# Patient Record
Sex: Female | Born: 1950 | Race: Asian | Hispanic: No | Marital: Married | Smoking: Never smoker
Health system: Southern US, Community
[De-identification: ages and names within clinical notes are randomized; demographics above are authoritative.]

## PROBLEM LIST (undated history)

## (undated) DIAGNOSIS — K219 Gastro-esophageal reflux disease without esophagitis: Secondary | ICD-10-CM

## (undated) DIAGNOSIS — M549 Dorsalgia, unspecified: Secondary | ICD-10-CM

## (undated) DIAGNOSIS — I700 Atherosclerosis of aorta: Secondary | ICD-10-CM

## (undated) DIAGNOSIS — M2550 Pain in unspecified joint: Secondary | ICD-10-CM

## (undated) DIAGNOSIS — K449 Diaphragmatic hernia without obstruction or gangrene: Secondary | ICD-10-CM

## (undated) DIAGNOSIS — R609 Edema, unspecified: Secondary | ICD-10-CM

## (undated) DIAGNOSIS — I10 Essential (primary) hypertension: Secondary | ICD-10-CM

## (undated) DIAGNOSIS — E785 Hyperlipidemia, unspecified: Secondary | ICD-10-CM

## (undated) DIAGNOSIS — J329 Chronic sinusitis, unspecified: Secondary | ICD-10-CM

## (undated) DIAGNOSIS — T7840XA Allergy, unspecified, initial encounter: Secondary | ICD-10-CM

## (undated) DIAGNOSIS — K5900 Constipation, unspecified: Secondary | ICD-10-CM

## (undated) DIAGNOSIS — M8580 Other specified disorders of bone density and structure, unspecified site: Secondary | ICD-10-CM

## (undated) DIAGNOSIS — Z86010 Personal history of colonic polyps: Secondary | ICD-10-CM

## (undated) DIAGNOSIS — G4730 Sleep apnea, unspecified: Secondary | ICD-10-CM

## (undated) HISTORY — DX: Sleep apnea, unspecified: G47.30

## (undated) HISTORY — PX: DILATION AND CURETTAGE OF UTERUS: SHX78

## (undated) HISTORY — DX: Atherosclerosis of aorta: I70.0

## (undated) HISTORY — DX: Allergy, unspecified, initial encounter: T78.40XA

## (undated) HISTORY — DX: Edema, unspecified: R60.9

## (undated) HISTORY — DX: Dorsalgia, unspecified: M54.9

## (undated) HISTORY — DX: Hyperlipidemia, unspecified: E78.5

## (undated) HISTORY — DX: Essential (primary) hypertension: I10

## (undated) HISTORY — DX: Pain in unspecified joint: M25.50

## (undated) HISTORY — DX: Constipation, unspecified: K59.00

## (undated) HISTORY — DX: Chronic sinusitis, unspecified: J32.9

## (undated) HISTORY — DX: Gastro-esophageal reflux disease without esophagitis: K21.9

## (undated) HISTORY — DX: Diaphragmatic hernia without obstruction or gangrene: K44.9

## (undated) HISTORY — DX: Other specified disorders of bone density and structure, unspecified site: M85.80

## (undated) HISTORY — DX: Personal history of colonic polyps: Z86.010

---

## 2004-05-09 ENCOUNTER — Encounter: Payer: Self-pay | Admitting: Internal Medicine

## 2007-06-13 ENCOUNTER — Encounter: Payer: Self-pay | Admitting: Pulmonary Disease

## 2008-06-12 LAB — HM COLONOSCOPY

## 2009-04-12 ENCOUNTER — Encounter: Payer: Self-pay | Admitting: Internal Medicine

## 2010-03-28 ENCOUNTER — Ambulatory Visit: Payer: Self-pay | Admitting: Diagnostic Radiology

## 2010-03-28 ENCOUNTER — Encounter: Payer: Self-pay | Admitting: Cardiovascular Disease

## 2010-03-28 ENCOUNTER — Encounter: Payer: Self-pay | Admitting: Emergency Medicine

## 2010-03-28 ENCOUNTER — Observation Stay (HOSPITAL_COMMUNITY): Admission: EM | Admit: 2010-03-28 | Discharge: 2010-03-29 | Payer: Self-pay | Admitting: Internal Medicine

## 2010-03-29 ENCOUNTER — Encounter (INDEPENDENT_AMBULATORY_CARE_PROVIDER_SITE_OTHER): Payer: Self-pay | Admitting: Internal Medicine

## 2010-04-07 DIAGNOSIS — E782 Mixed hyperlipidemia: Secondary | ICD-10-CM | POA: Insufficient documentation

## 2010-04-07 DIAGNOSIS — G4730 Sleep apnea, unspecified: Secondary | ICD-10-CM | POA: Insufficient documentation

## 2010-04-08 ENCOUNTER — Ambulatory Visit: Payer: Self-pay | Admitting: Cardiovascular Disease

## 2010-04-27 ENCOUNTER — Ambulatory Visit: Payer: Self-pay | Admitting: Internal Medicine

## 2010-04-27 ENCOUNTER — Telehealth (INDEPENDENT_AMBULATORY_CARE_PROVIDER_SITE_OTHER): Payer: Self-pay | Admitting: Radiology

## 2010-04-27 ENCOUNTER — Ambulatory Visit (HOSPITAL_BASED_OUTPATIENT_CLINIC_OR_DEPARTMENT_OTHER)
Admission: RE | Admit: 2010-04-27 | Discharge: 2010-04-27 | Payer: Self-pay | Source: Home / Self Care | Admitting: Cardiovascular Disease

## 2010-04-27 ENCOUNTER — Ambulatory Visit: Payer: Self-pay | Admitting: Diagnostic Radiology

## 2010-04-27 DIAGNOSIS — R7309 Other abnormal glucose: Secondary | ICD-10-CM | POA: Insufficient documentation

## 2010-04-27 DIAGNOSIS — K449 Diaphragmatic hernia without obstruction or gangrene: Secondary | ICD-10-CM

## 2010-04-27 DIAGNOSIS — K219 Gastro-esophageal reflux disease without esophagitis: Secondary | ICD-10-CM | POA: Insufficient documentation

## 2010-04-27 DIAGNOSIS — R7401 Elevation of levels of liver transaminase levels: Secondary | ICD-10-CM | POA: Insufficient documentation

## 2010-04-27 DIAGNOSIS — R740 Nonspecific elevation of levels of transaminase and lactic acid dehydrogenase [LDH]: Secondary | ICD-10-CM

## 2010-04-27 DIAGNOSIS — R0789 Other chest pain: Secondary | ICD-10-CM | POA: Insufficient documentation

## 2010-04-27 DIAGNOSIS — R7402 Elevation of levels of lactic acid dehydrogenase (LDH): Secondary | ICD-10-CM | POA: Insufficient documentation

## 2010-04-27 HISTORY — DX: Gastro-esophageal reflux disease without esophagitis: K44.9

## 2010-04-27 HISTORY — DX: Gastro-esophageal reflux disease without esophagitis: K21.9

## 2010-04-28 ENCOUNTER — Ambulatory Visit (HOSPITAL_COMMUNITY)
Admission: RE | Admit: 2010-04-28 | Discharge: 2010-04-28 | Payer: Self-pay | Source: Home / Self Care | Admitting: Cardiovascular Disease

## 2010-04-28 ENCOUNTER — Ambulatory Visit: Payer: Self-pay

## 2010-04-28 ENCOUNTER — Ambulatory Visit: Payer: Self-pay | Admitting: Cardiology

## 2010-05-17 ENCOUNTER — Ambulatory Visit: Payer: Self-pay | Admitting: Pulmonary Disease

## 2010-06-01 ENCOUNTER — Encounter: Payer: Self-pay | Admitting: Internal Medicine

## 2010-06-01 ENCOUNTER — Ambulatory Visit
Admission: RE | Admit: 2010-06-01 | Discharge: 2010-06-01 | Payer: Self-pay | Source: Home / Self Care | Attending: Internal Medicine | Admitting: Internal Medicine

## 2010-06-01 LAB — CONVERTED CEMR LAB
ALT: 45 units/L — ABNORMAL HIGH (ref 0–35)
Alkaline Phosphatase: 79 units/L (ref 39–117)
BUN: 12 mg/dL (ref 6–23)
Bilirubin, Direct: 0.1 mg/dL (ref 0.0–0.3)
Cholesterol: 177 mg/dL (ref 0–200)
Creatinine, Ser: 0.58 mg/dL (ref 0.40–1.20)
Helicobacter Pylori Antibody-IgG: 3.8 — ABNORMAL HIGH
Indirect Bilirubin: 0.4 mg/dL (ref 0.0–0.9)
Total Protein: 7.4 g/dL (ref 6.0–8.3)
Triglycerides: 79 mg/dL (ref <150)

## 2010-06-03 ENCOUNTER — Telehealth: Payer: Self-pay | Admitting: Internal Medicine

## 2010-06-08 ENCOUNTER — Telehealth: Payer: Self-pay | Admitting: Internal Medicine

## 2010-06-08 DIAGNOSIS — A048 Other specified bacterial intestinal infections: Secondary | ICD-10-CM | POA: Insufficient documentation

## 2010-06-23 ENCOUNTER — Telehealth: Payer: Self-pay | Admitting: Internal Medicine

## 2010-06-24 ENCOUNTER — Encounter: Payer: Self-pay | Admitting: Internal Medicine

## 2010-06-27 ENCOUNTER — Telehealth: Payer: Self-pay | Admitting: Internal Medicine

## 2010-06-28 ENCOUNTER — Ambulatory Visit
Admission: RE | Admit: 2010-06-28 | Discharge: 2010-06-28 | Payer: Self-pay | Source: Home / Self Care | Attending: Pulmonary Disease | Admitting: Pulmonary Disease

## 2010-06-28 NOTE — Assessment & Plan Note (Signed)
Summary: nph/self ref ed/chest pain/bcbs/mj   Visit Type:  Initial Consult Primary Ember Henrikson:  None   History of Present Illness: Margaret [name] is seen today at the request of Wood.  She was seen on 10/31 for SSCP.  Records reviewed.  R/O normal CXR and normal ECG.  Recommended outpatient stress test.  She had 304 days of SSCP made worse by certain foods, radiating to neck.  There is clearly a componant of GERD or biliary cholic to her story as fatty foods make the pain worse and it is worse if she eats late at night.  Has exertional dyspnea which sounds more like deconditioning but no exertional SSCP.  CRF ? elevated cholesterol not on statin.  Moved from [place] a year ago and has been trying to get into see Dr Artist Pais as a new primary.  Denies palpitaitons, pleuritic pain, nausea, vohmiting, edema or syncope.  No previous GB disease  Current Problems (verified): 1)  Chest Pain  (ICD-786.50) 2)  Hyperlipidemia  (ICD-272.4) 3)  Sleep Apnea  (ICD-780.57)  Current Medications (verified): 1)  Aspirin Ec 325 Mg Tbec (Aspirin) .... Take One Tablet By Mouth Daily 2)  Crestor 5 Mg Tabs (Rosuvastatin Calcium) .... Take One Tablet By Mouth Daily. 3)  Multivitamins   Tabs (Multiple Vitamin) .... Once Daily 4)  Fish Oil   Oil (Fish Oil)  Allergies (verified): No Known Drug Allergies  Past History:  Past Medical History: Last updated: 04/07/2010 CHEST PAIN  HYPERLIPIDEMIA SLEEP APNEA   Past Surgical History: Last updated: 04/07/2010 Dilation and Curettage  Family History: Last updated: 04/07/2010 Significant for stroke in the Mother  Social History: Last updated: 04/07/2010 Tobacco Use - No.  Alcohol Use - no Drug Use - no Moved from [place] in 2010  Review of Systems       Denies fever, malais, weight loss, blurry vision, decreased visual acuity, cough, sputum, SOB, hemoptysis, pleuritic pain, palpitaitons, heartburn, abdominal pain, melena, lower extremity edema, claudication, or  rash.   Vital Signs:  Patient profile:   60 year old female Height:      62 inches Weight:      185 pounds BMI:     33.96 O2 Sat:      63 % BP sitting:   130 / 64  (left arm)  Vitals Entered By: Laurance Flatten CMA (April 08, 2010 11:11 AM)  Physical Exam  General:  Affect appropriate Healthy:  appears stated age HEENT: normal Neck supple with no adenopathy JVP normal no bruits no thyromegaly Lungs clear with no wheezing and good diaphragmatic motion Heart:  S1/S2 no murmur,rub, gallop or click PMI normal Abdomen: benighn, BS positve, no tenderness, no AAA no bruit.  No HSM or HJR Distal pulses intact with no bruits No edema Neuro non-focal Skin warm and dry    Impression & Recommendations:  Problem # 1:  CHEST PAIN (ICD-786.50)  Atypical F/U stress echo Needs GB US and H-2 blocker.  Advised on low carb diet.  F/U with Dr Artist Pais Her updated medication list for this problem includes:    Aspirin Ec 325 Mg Tbec (Aspirin) .Marland Kitchen... Take one tablet by mouth daily  Orders: Misc. Referral (Misc. Ref) Stress Echo (Stress Echo)  Problem # 2:  HYPERLIPIDEMIA (ICD-272.4) Continue statin labs per primary.  Target LDL less than 100 Her updated medication list for this problem includes:    Crestor 5 Mg Tabs (Rosuvastatin calcium) .Marland Kitchen... Take one tablet by mouth daily.  Other Orders: Primary Care Referral (Primary)  Patient Instructions: 1)  Your physician recommends that you schedule a follow-up appointment in:  AS NEEDED 2)  Your physician recommends that you continue on your current medications as directed. Please refer to the Current Medication list given to you today. 3)  You have been referred to [facility] FOR PRIMARY CARE 4)  ALSO NEEDS  GALLBLADDER ULTRASOUND 5)  Your physician has requested that you have a stress echocardiogram. For further information please visit [email or web address]  Please follow instruction sheet as given.   EKG Report  Procedure date:   03/28/2010  Findings:      NSR 59 Normal ECG

## 2010-06-28 NOTE — Assessment & Plan Note (Signed)
Summary: Est care BCBS/dt   Vital Signs:  Patient profile:   60 year old female Height:      61.25 inches Weight:      185.75 pounds BMI:     34.94 O2 Sat:      94 % on Room air Temp:     98.3 degrees F oral Pulse rate:   64 / minute Resp:     16 per minute BP sitting:   130 / 68  (right arm) Cuff size:   large  Vitals Entered By: Glendell Docker CMA (April 27, 2010 9:19 AM)  O2 Flow:  Room air CC: New Patient  Is Patient Diabetic? No Pain Assessment Patient in pain? no      Comments Establish care, scheduled for gall bladder ultrasound at 10:30a   Primary Care Provider:  Dondra Spry DO  CC:  New Patient .  History of Present Illness: 60 y/o female to establish hx of recent chest pain seen by Dr. Eden Emms - stress test planned symptoms worse with food raising possibilty of biliary colic vs gerd abd u/s planned she also has unexplained elevation in LFTs she denies hx of chronic liver dz,  she denies alcohol use no family hx of chronic liver dz  Preventive Screening-Counseling & Management  Alcohol-Tobacco     Alcohol drinks/day: 0     Smoking Status: never  Caffeine-Diet-Exercise     Caffeine use/day: 1-2 beverages daily     Does Patient Exercise: no  Allergies (verified): No Known Drug Allergies  Past History:  Past Medical History: CHEST PAIN  HYPERLIPIDEMIA  SLEEP APNEA  GERD Obesity  Family History: Significant for stroke in the Mother  at age 40 Family History Lung cancer - father No cancer No DM II  Social History: Tobacco Use - No.  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  The patient denies weight loss, syncope, prolonged cough, melena, hematochezia, and depression.         snoring,  daytime  somnolence  Physical Exam  General:  alert, well-developed, and well-nourished.   Head:  normocephalic and atraumatic.   Eyes:  pupils equal, pupils round, and pupils reactive to light.   Ears:  R ear normal and L ear normal.   Mouth:  pharynx pink and moist.   Neck:  No deformities, masses, or tenderness noted.no carotid bruits.   Lungs:  normal respiratory effort, normal breath sounds, no crackles, and no wheezes.   Heart:  normal rate, regular rhythm, and no gallop.   Abdomen:  soft, non-tender, normal bowel sounds, no hepatomegaly, and no splenomegaly.   Extremities:  No lower extremity edema  Neurologic:  cranial nerves II-XII intact and gait normal.   Psych:  normally interactive, good eye contact, not anxious appearing, and not depressed appearing.     Impression & Recommendations:  Problem # 1:  CHEST PAIN, ATYPICAL (ICD-786.59) stress echo planned awaiting results of abd u/s use PPI regularly  Problem # 2:  NONSPEC ELEVATION OF LEVELS OF TRANSAMINASE/LDH (ICD-790.4) rule out chronic liver dz  Orders: T-Hepatitis B Surface Antibody ([id number]) T-Hepatitis B Surface Antigen ([id number]) T-Hepatitis C Antibody (60630-16010) T-Ferritin (93235-57322) T-Iron ([id number]) T-Iron Binding Capacity (TIBC) ([address])  Problem #  3:  SLEEP APNEA (ICD-780.57)  Orders: Sleep Disorder Referral (Sleep Disorder)  Complete Medication List: 1)  Aspirin Ec 325 Mg Tbec (Aspirin) .... Take one tablet by mouth daily 2)  Crestor 5 Mg Tabs (Rosuvastatin calcium) .... Take one tablet by mouth daily. 3)  Multivitamins Tabs (Multiple vitamin) .... Once daily 4)  Fish Oil Oil (Fish oil) 5)  Calcium 500 Mg Tabs (Calcium) .... 2 tablets by mouth once daily 6)  Omeprazole 40 Mg Cpdr (Omeprazole) .... One by mouth once daily am 30 mins before breakfast  Other Orders: T- Hemoglobin A1C (16109-60454) T-Hepatic Function [phone] [id number]) T-Lipid Profile (29562-13086)  Patient  Instructions: 1)  Please schedule a follow-up appointment in 1 month. Prescriptions: OMEPRAZOLE 40 MG CPDR (OMEPRAZOLE) one by mouth once daily AM 30 mins before breakfast  #30 x 2   Entered and Authorized by:   D. Thomos Lemons DO   Signed by:   D. Thomos Lemons DO on 04/27/2010   Method used:   Print then Give to Patient   RxID:   [id number]    Orders Added: 1)  T-Hepatitis B Surface Antibody [[id number]] 2)  T-Hepatitis B Surface Antigen [66440-34742] 3)  T-Hepatitis C Antibody [59563-87564] 4)  T-Ferritin [82728-23350] 5)  T-Iron [33295-18841] 6)  T-Iron Binding Capacity (TIBC) [[address]] 7)  T- Hemoglobin A1C [83036-23375] 8)  T-Hepatic Function [80076-22960] 9)  T-Lipid Profile [80061-22930] 10)  Sleep Disorder Referral [Sleep Disorder] 11)  New Patient Level III [99203]   Immunization History:  Influenza Immunization History:    Influenza:  declined (04/27/2010)   Contraindications/Deferment of Procedures/Staging:    Test/Procedure: FLU VAX    Reason for deferment: patient declined   Immunization History:  Influenza Immunization History:    Influenza:  Declined (04/27/2010)   Preventive Care Screening  Mammogram:    Date:  04/27/2010    Results:  appt 12/5  Pap Smear:    Date:  03/07/2010    Results:  normal   Bone Density:    Date:  03/15/2009    Results:  normal std dev  Colonoscopy:    Date:  04/14/2008    Results:  normal    Current Allergies (reviewed today): No known allergies

## 2010-06-28 NOTE — Progress Notes (Signed)
Summary: stress echo pre-procedure  Phone Note Outgoing Call   Call placed by: Harlow Asa CNMT Call placed to: Patient Reason for Call: Confirm/change Appt Summary of Call: Spoke with patient concerning Stress Echo.

## 2010-06-30 NOTE — Assessment & Plan Note (Addendum)
Summary: sleep apnea/jd   Visit Type:  Initial Consult Copy to:  pcp Primary Provider/Referring Provider:  Dondra Spry DO  CC:  Sleep evaluation.  History of Present Illness: 59/F, Filipino woman, never smoker, for management of obstructive sleep apnea. She moved from [place] 1 yr ago 2 yrs ago, she had PSG in [place] , put on CPAP ? pressure ([employer]) with swift nasal pillows Uses 4-5 h, takes it off for BR visit Bedtime MN to 1 A, used to be 10p, stays in bed sometimes until 8A or 10A, rested with cpap sleeps on side x 2 pillows, Epworth Sleepiness Score 19 gained 20 lbs over last 2 y If does not use the CPAP, dry mouth There is no history suggestive of cataplexy, sleep paralysis or parasomnias   Preventive Screening-Counseling & Management  Alcohol-Tobacco     Alcohol drinks/day: 0     Smoking Status: never   History of Present Illness: sometimes good. sometimes not  What time do you typically go to bed?(between what hours): between 12pm-1am  How long does it take you to fall asleep? right away  How many times during the night do you wake up? at least 2x  What time do you get out of bed to start your day? sometimes 8am or 10am  Do you drive or operate heavy machinery in your occupation? no  How much has your weight changed (up or down) over the past two years? (in pounds): at least 20 lbs increase  Have you ever had a sleep study before?  If yes,when and where: yes, at least 2 years ago  Do you currently use CPAP ? If so , at what pressure? yes, 4  Do you wear oxygen at any time? If yes, how many liters per minute? No Current Medications (verified): 1)  Aspirin Ec 325 Mg Tbec (Aspirin) .... Take One Tablet By Mouth Daily 2)  Crestor 5 Mg Tabs (Rosuvastatin Calcium) .... Take One Tablet By Mouth Daily. 3)  Multivitamins   Tabs (Multiple Vitamin) .... Once Daily 4)  Fish Oil   Oil (Fish Oil) .... Take 1 Capsule By Mouth Once A Day 5)  Calcium 500 Mg Tabs (Calcium) .... 2  Tablets By Mouth Once Daily 6)  Cpap  Allergies (verified): No Known Drug Allergies  Past History:  Past Medical History: Last updated: 04/27/2010 CHEST PAIN  HYPERLIPIDEMIA  SLEEP APNEA  GERD Obesity  Past Surgical History: Last updated: 04/07/2010 Dilation and Curettage  Family History: Last updated: 04/27/2010 Significant for stroke in the Mother  at age 40 Family History Lung cancer - father No cancer No DM II  Social History: Last updated: 04/27/2010 Tobacco Use - No.  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  The patient denies shortness of breath at rest, productive cough, non-productive cough, coughing up blood, chest pain, irregular heartbeats, loss of appetite, abdominal pain, difficulty swallowing, sore throat, tooth/dental problems, itching, ear ache, anxiety, depression, hand/feet swelling, joint stiffness or pain, rash, and fever.    Vital Signs:  Patient profile:   60 year old female Height:      61.25 inches Weight:  185 pounds BMI:     34.80 O2 Sat:      96 % on Room air Temp:     98.1 degrees F oral Pulse rate:   82 / minute BP sitting:   108 / 72  (left arm) Cuff size:   large  Vitals Entered By: Zackery Barefoot CMA (May 17, 2010 2:57 PM)  O2 Flow:  Room air CC: Sleep evaluation Comments Medications reviewed with patient Verified contact number and pharmacy with patient Zackery Barefoot CMA  May 17, 2010 2:57 PM    Physical Exam  Additional Exam:  Gen. Pleasant, well-nourished, in no distress, normal affect ENT - no lesions, no post nasal drip, class 3 airway Neck: No JVD, no thyromegaly, no carotid  bruits Lungs: no use of accessory muscles, no dullness to percussion, clear without rales or rhonchi  Cardiovascular: Rhythm regular, heart sounds  normal, no murmurs or gallops, no peripheral edema Abdomen: soft and non-tender, no hepatosplenomegaly, BS normal. Musculoskeletal: No deformities, no cyanosis or clubbing Neuro:  alert, non focal     Impression & Recommendations:  Problem # 1:  SLEEP APNEA (ICD-780.57) The pathophysiology of obstructive sleep apnea, it's cardiovascular consequences and modes of treatment including CPAP were discussed with the patient in great detail.  Nasonex spray  New nasal mask instead of pillows. Compliance encouraged, wt loss emphasized, asked to avoid meds with sedative side effects, cautioned against driving when sleepy.  Obtian PSG - discussed other options  Orders: Consultation Level III (96295) DME Referral (DME)  Medications Added to Medication List This Visit: 1)  Fish Oil Oil (Fish oil) .... Take 1 capsule by mouth once a day 2)  Cpap   Patient Instructions: 1)  Copy sent to: dr Artist Pais 2)  Please schedule a follow-up appointment in 1 month. 3)  We will get you a new nasal mask & check download from your machine 4)  Weight loss is advised - 20 lbs 5)  Nasal spray - nasonex 1 spray each nare at bedtime    Appended Document: sleep apnea/jd download on 10 cm upto 07/06/10 >> poor usage - pl call pt  Appended Document: sleep apnea/jd pt informed to increase usage to at least 4 hours every night but more usage will have better benefits.

## 2010-06-30 NOTE — Assessment & Plan Note (Signed)
Summary: 1 month follow up/mhf   Vital Signs:  Patient profile:   60 year old female Height:      61.25 inches Weight:      185.25 pounds BMI:     34.84 O2 Sat:      97 % on Room air Temp:     97.9 degrees F oral Pulse rate:   82 / minute Resp:     18 per minute BP sitting:   114 / 74  (right arm) Cuff size:   large  Vitals Entered By: Glendell Docker CMA (June 01, 2010 10:59 AM)  O2 Flow:  Room air CC: 1 month follow up Is Patient Diabetic? No Pain Assessment Patient in pain? no      Comments states she had problems with omeprazole with dizziness, so she stopped taking the medication   Primary Care Provider:  DThomos Lemons DO  CC:  1 month follow up.  History of Present Illness: 60 y/o Asian female for f/u re:  atypical chest pain pt feeling better she tried ompeprazole but stopped due to dizziness  Preventive Screening-Counseling & Management  Alcohol-Tobacco     Smoking Status: never  Allergies (verified): 1)  Omeprazole (Omeprazole)  Past History:  Past Medical History: CHEST PAIN  HYPERLIPIDEMIA  SLEEP APNEA   GERD Obesity  Family History: Significant for stroke in the Mother  at age 58 Family History Lung cancer - father No cancer No DM II    Social History: Tobacco Use - No.  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   Lungs:  normal respiratory effort and normal breath sounds.   Heart:  normal rate, regular rhythm, and no gallop.   Extremities:  No lower extremity edema    Impression & Recommendations:  Problem # 1:  DIABETES MELLITUS, TYPE II, BORDERLINE (ICD-790.29) pt reports prev pcp monitoring A1c due to borderline DM II Pt counseled on diet and  exercise.  Orders: T-Basic Metabolic Panel [id number]) T- Hemoglobin A1C ([id number])  Her updated medication list for this problem includes:    Metformin Hcl 500 Mg Tabs (Metformin hcl) .Marland Kitchen... 1/2 by mouth two times a day x 1 week, then one by mouth two times a day  Problem # 2:  HYPERLIPIDEMIA (ICD-272.4)  Her updated medication list for this problem includes:    Crestor 5 Mg Tabs (Rosuvastatin calcium) .Marland Kitchen... Take one tablet by mouth daily.  Orders: T-Hepatic Function [id number]) T-Lipid Profile [id number]) T-TSH [phone])  Problem # 3:  CHEST PAIN, ATYPICAL (ICD-786.59) possible gerd.  check H. Pylori antibody  Complete Medication List: 1)  Aspirin Ec 325 Mg Tbec (Aspirin) .... Take one tablet by mouth daily 2)  Crestor 5 Mg Tabs (Rosuvastatin calcium) .... Take one tablet by mouth daily. 3)  Multivitamins Tabs (Multiple vitamin) .... Once daily 4)  Fish Oil Oil (Fish oil) .... Take 1 capsule by mouth once a day 5)  Calcium 500 Mg Tabs (Calcium) .... 2 tablets by mouth once daily 6)  Cpap  7)  Metformin Hcl 500 Mg Tabs (Metformin hcl) .... 1/2 by mouth two times a day x 1 week, then one by mouth two  times a day  Other Orders: T- * Misc. Laboratory test [id number] [phone])  Patient Instructions: 1)  Please schedule a follow-up appointment in 6 months. 2)  Start regular exercise program Prescriptions: CRESTOR 5 MG TABS (ROSUVASTATIN CALCIUM) Take one tablet by mouth daily.  #90 x 1   Entered and Authorized by:   D. Thomos Lemons DO   Signed by:   D. Thomos Lemons DO on 06/01/2010   Method used:   Electronically to        [facility] [phone]* (retail)       [address]       [place], [place]  [address]       Ph: [phone]       Fax: [phone]   RxID:   [id number]    Orders Added: 1)  T-Basic Metabolic Panel [id number] 2)  T- Hemoglobin A1C [[id number]] 3)  T-Hepatic Function [80076-22960] 4)  T-Lipid Profile  [80061-22930] 5)  T-TSH [[id number]] 6)  T- * Misc. Laboratory test [99999] 7)  Est. Patient Level III [36144]

## 2010-06-30 NOTE — Progress Notes (Signed)
Summary: Lab Results  Phone Note Outgoing Call   Summary of Call: call pt - blood work shows pt has + H. Pylori antibody.  I suggest referral to GI for EGD and treatment also pt is very close to being diabetic.  I suggest pt start metformin.  see rx. ROV in 3 months Initial call taken by: D. Thomos Lemons DO,  June 08, 2010 11:04 AM  Follow-up for Phone Call        call placed to patient at [phone], no answer. A detailed voice message was left informing patient per Dr Artist Pais instructions. Message left for patient to call back if any questions Follow-up by: Glendell Docker CMA,  June 08, 2010 11:12 AM  Additional Follow-up for Phone Call Additional follow up Details #1::        call placed to patient  at  [phone],she has been advised per Dr Artist Pais instructions. She states that she is out of town in [place] and will return on Saturday. She states that she will follow up with a return call on Monday. She would like to know what H-Pylori is and how is she is affected by this. She would also like to know why does she have to take the Metformin if she is not diabetic yet.  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 Pylori infection pt states she had EGD and colonoscopy last yr in [place].  Pt not sure if H. Pylori testing was completed pt will contact GI specialist and ask them to fax [place] reports (including pathology report)  Pt does not want to start metformin for now.  she would like additional 3 months to try diet and  exercise agreed  plz schedule repeat BMET and A1c in 3 months and OV.  [facility] referral can be deferred until we obtatin copy of reports from [place] Additional Follow-up by: D. 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 Thomos Lemons DO   Signed by:   D. Thomos Lemons DO on 06/08/2010   Method used:   [facility] to        [facility] [phone]* (retail)       [address]       [place], [place]  [address]       Ph: [phone]       Fax: [phone]   RxID:   [id number]  Lab order enetered for April of 2012 for BMET and A1C Glendell Docker CMA  June 13, 2010 9:26 AM

## 2010-06-30 NOTE — Miscellaneous (Signed)
Summary: colonoscopy  Clinical Lists Changes  Observations: Added new observation of COLONOSCOPY: normal. Small nodule descending colon, very few diverticula. Next due in 2016 (04/12/2010 14:03)      Preventive Care Screening  Colonoscopy:    Date:  04/12/2010    Results:  normal. Small nodule descending colon, very few diverticula. Next due in 2016

## 2010-06-30 NOTE — Progress Notes (Signed)
Summary: International Travel  Phone Note Call from Patient Call back at Home Phone [phone]   Caller: Patient Call For: Fianna Snowball  Summary of Call: SHE IS GOING TO [place] AND SHE NEEDS TO GET SEVERAL TESTS DONE AND SOME SHOTS  TO GET A YELLOW CARD TO GO THERE.  WOULD WE BE ABLE TO DO THESE SHOTS AND TESTS FOR HER.  Initial call taken by: Roselle Locus,  June 03, 2010 3:50 PM  Follow-up for Phone Call        call was returned to patient at [phone], no answer. She was informed international travel vaccines were no administered in the office. The number for [facility] international travel was [phone]) for patient contact Follow-up by: Glendell Docker CMA,  June 03, 2010 5:06 PM

## 2010-06-30 NOTE — Progress Notes (Signed)
Summary: Record Status  Phone Note Call from Patient Call back at Home Phone [phone]   Caller: Patient Call For: D. Thomos Lemons DO Summary of Call: patient called and left message wanting to know if records have been recieved from her GI doctor in [place].   Call was returned to patient at [phone], no answer. A detailed voice message was left for patient informing patient records had not been recieved. She was advised to call back if any questions Initial call taken by: Glendell Docker CMA,  June 23, 2010 8:34 AM  Follow-up for Phone Call        Records received and forwarded to Provider for review. Nicki Guadalajara Fergerson CMA Duncan Dull)  June 24, 2010 2:04 PM

## 2010-07-06 NOTE — Assessment & Plan Note (Signed)
Summary: 1 month follow up in [facility]//jwr   Visit Type:  Follow-up Copy to:  pcp Primary Provider/Referring Provider:  Dondra Spry DO  CC:  Follow-up visit on OSA pt states has not used CPAP x 1 month.  History of Present Illness: 59/F, Filipino woman, never smoker, for management of obstructive sleep apnea. She moved from [place] 1 yr ago 2 yrs ago, she had PSG in [place] , put on CPAP ? pressure ([facility]) with swift nasal pillows Uses 4-5 h, takes it off for BR visit Bedtime MN to 1 A, used to be 10p, stays in bed sometimes until 8A or 10A, rested with cpap sleeps on side x 2 pillows, Epworth Sleepiness Score 19 gained 20 lbs over last 2 y If does not use the CPAP, dry mouth  June 28, 2010 11:38 AM  Reviewed PSG 1/09 >> AHI 49/h, lowest desatn 71%, , corrected by CPAP 9 cm, placed on auto CPA with nasal pillows There is no history suggestive of cataplexy, sleep paralysis or parasomnias   Preventive Screening-Counseling & Management  Alcohol-Tobacco     Alcohol drinks/day: 0     Smoking Status: never  Current Medications (verified): 1)  Crestor 5 Mg Tabs (Rosuvastatin Calcium) .... Take One Tablet By Mouth Daily. 2)  Multivitamins   Tabs (Multiple Vitamin) .... Once Daily 3)  Fish Oil   Oil (Fish Oil) .... Take 1 Capsule By Mouth Once A Day 4)  Calcium 500 Mg Tabs (Calcium) .... 2 Tablets By Mouth Once Daily 5)  Cpap 6)  Metformin Hcl 500 Mg Tabs (Metformin Hcl) .... ***not Taking***1/2 By Mouth Two Times A Day X 1 Week, Then One By Mouth Two Times A Day  Allergies (verified): 1)  Omeprazole (Omeprazole)  Past History:  Past Medical History: Last updated: 06/01/2010 CHEST PAIN  HYPERLIPIDEMIA  SLEEP APNEA   GERD Obesity  Social History: Last updated: 06/01/2010 Tobacco Use - No.  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    Vital Signs:  Patient profile:   60 year old female Height:      61.25 inches Weight:      182 pounds BMI:     34.23 O2 Sat:      96 % on Room air Temp:     98.6 degrees F oral Pulse rate:   62 / minute BP sitting:   120 / 70  (right arm) Cuff size:   large  Vitals Entered By: Zackery Barefoot CMA (June 28, 2010 11:18 AM)  O2 Flow:  Room air CC: Follow-up visit on OSA pt states has not used CPAP x 1 month Comments Medications reviewed with patient Verified contact number and pharmacy with patient Zackery Barefoot [facility]  June 28, 2010 11:19 AM    Physical Exam  Additional Exam:  wt 182 June 28, 2010  Gen. Pleasant, well-nourished, in no distress, normal affect ENT - no lesions, no post nasal drip, class 3 airway Neck: No JVD, no thyromegaly, no carotid bruits Lungs: no use of accessory muscles, no dullness  to percussion, clear without rales or rhonchi  Cardiovascular: Rhythm regular, heart sounds  normal, no murmurs or gallops, no peripheral edema Musculoskeletal: No deformities, no cyanosis or clubbing      Impression & Recommendations:  Problem # 1:  SLEEP APNEA (ICD-780.57)  Have asked [facility] to provide new nasal mask. The pathophysiology of obstructive sleep apnea, it's cardiovascular consequences and modes of treatment including CPAP were discussed with the patient in great detail.  Discussed compliance Compliance encouraged, wt loss emphasized, asked to avoid meds with sedative side effects, cautioned against driving when sleepy.  Rechk ownlaod once she gets  back on her machine  Orders: Est. Patient Level III (91478)  Medications Added to Medication List This Visit: 1)  Metformin Hcl 500 Mg Tabs (Metformin hcl) .... ***not taking***1/2 by mouth two times a day x 1 week, then one by mouth two times a day  Patient Instructions: 1)  Copy sent to: Dr Artist Pais 2)  Please schedule a follow-up appointment in 3 months. 3)  Get back on your cpap machine 4)  we discussed your sleep study & the severity of  your obstructive sleep apnea. 5)  Turn in the card afte you have used CPPA for a few weeks

## 2010-07-06 NOTE — Progress Notes (Signed)
Summary: GI Referral  Phone Note Outgoing Call   Call placed by: Glendell Docker CMA,  June 27, 2010 1:47 PM Call placed to: [phone]- Dr Jasper Riling Summary of Call: Call placed to  Dr Gwen Her office- at [phone], spoke with receptionist at Dr Gwen Her office to find out if there was a report for EGD. The receptionist stated there was no report. The only test patient had performed was a colonoscopy. There were no other test performed there.   Call was placed to patient at [phone], she was informed records were recieved from Dr Gwen Her office. She was asked if she was seen by another GI other than Dr Harrold Donath. She states that Dr Harrold Donath was the only doctor she was seen by. She was informed that I spoke with there office and the only testing done was a colonscopy. She stated that was the test Dr Artist Pais was wanting her to have. She was informed Dr Artist Pais was asking about EGD, and informed Dr Gwen Her office did not have one file as being performed. Initial call taken by: Glendell Docker CMA,  June 27, 2010 1:51 PM  Follow-up for Phone Call        call was returned to patient at [phone], she was asked if she wanted to proceed with the GI referral for EGD. Patient states she did have a EGD. She was informed that I spoke with the office and was informed they did not have results on file and test was not performed. She states she will call back after checking with their office. Follow-up by: Glendell Docker CMA,  June 27, 2010 2:02 PM  Additional Follow-up for Phone Call Additional follow up Details #1::        patient called back and left voice message stating she would like to reschedule her appointment with GI. Additional Follow-up by: Glendell Docker CMA,  June 27, 2010 3:07 PM

## 2010-07-11 ENCOUNTER — Encounter: Payer: Self-pay | Admitting: Gastroenterology

## 2010-07-11 ENCOUNTER — Encounter (INDEPENDENT_AMBULATORY_CARE_PROVIDER_SITE_OTHER): Payer: Self-pay | Admitting: *Deleted

## 2010-07-11 ENCOUNTER — Ambulatory Visit (INDEPENDENT_AMBULATORY_CARE_PROVIDER_SITE_OTHER): Payer: BC Managed Care – PPO | Admitting: Gastroenterology

## 2010-07-11 DIAGNOSIS — Z1211 Encounter for screening for malignant neoplasm of colon: Secondary | ICD-10-CM

## 2010-07-11 DIAGNOSIS — R933 Abnormal findings on diagnostic imaging of other parts of digestive tract: Secondary | ICD-10-CM

## 2010-07-13 ENCOUNTER — Encounter: Payer: Self-pay | Admitting: Pulmonary Disease

## 2010-07-14 NOTE — Procedures (Signed)
Summary: Colonoscopy/R Jasper Riling MD  Colonoscopy/R Jasper Riling MD   Imported By: Lanelle Bal 07/08/2010 11:30:09  _____________________________________________________________________  External Attachment:    Type:   Image     Comment:   External Document

## 2010-07-15 ENCOUNTER — Other Ambulatory Visit: Payer: Self-pay | Admitting: Gastroenterology

## 2010-07-15 ENCOUNTER — Other Ambulatory Visit (AMBULATORY_SURGERY_CENTER): Payer: BC Managed Care – PPO | Admitting: Gastroenterology

## 2010-07-15 DIAGNOSIS — K449 Diaphragmatic hernia without obstruction or gangrene: Secondary | ICD-10-CM

## 2010-07-15 DIAGNOSIS — K2940 Chronic atrophic gastritis without bleeding: Secondary | ICD-10-CM

## 2010-07-15 DIAGNOSIS — R933 Abnormal findings on diagnostic imaging of other parts of digestive tract: Secondary | ICD-10-CM

## 2010-07-15 DIAGNOSIS — K2970 Gastritis, unspecified, without bleeding: Secondary | ICD-10-CM

## 2010-07-15 LAB — GLUCOSE, CAPILLARY

## 2010-07-20 NOTE — Procedures (Addendum)
Summary: Upper Endoscopy  Patient: Margaret [name] Note: All result statuses are Final unless otherwise noted.  Tests: (1) Upper Endoscopy (EGD)   EGD Upper Endoscopy       DONE     [facility]     [address]     [place], [place]  [address]           ENDOSCOPY PROCEDURE REPORT           PATIENT:  Margaret, [name]  MR#:  [id number]     BIRTHDATE:  12-31-1950, 59 yrs. old  GENDER:  female     ENDOSCOPIST:  Rachael Fee, MD     Referred by:  Thomos Lemons, DO     PROCEDURE DATE:  07/15/2010     PROCEDURE:  EGD with biopsy, 19147     ASA CLASS:  Class II     INDICATIONS:  recent h. pylori + by serology, mild     "dysphagia-like" senstation     MEDICATIONS:  Fentanyl 50 mcg IV, Versed 2 mg IV     TOPICAL ANESTHETIC:  Exactacain Spray           DESCRIPTION OF PROCEDURE:   After the risks benefits and     alternatives of the procedure were thoroughly explained, informed     consent was obtained.  The LB GIF-H180 [id number] endoscope was     introduced through the mouth and advanced to the second portion of     the duodenum, without limitations.  The instrument was slowly     withdrawn as the mucosa was fully examined.     <<PROCEDUREIMAGES>>           There was mild, non specific gastritis that was biopsied     (pathology jar 1) (see image3).  A hiatal hernia was found. This     was 2-3cm (see image2).There was a 1.5cm, submucosal nodule in     very proximal stomach. Appeared benign (see image7 and image8).     Otherwise the examination was normal (see image6 and image5).     Retroflexed views revealed no abnormalities.    The scope was then     withdrawn from the patient and the procedure completed.           COMPLICATIONS:  None           ENDOSCOPIC IMPRESSION:     1) Mild gastritis, biopsied to check for H. pylori     2) Hiatal hernia, small     3) Benign appearing 1-1.5cm submucosal lesion in proximal     stomach (GIST?).  This is unlikely to be causing any  symptoms.     4) Otherwise normal examination           RECOMMENDATIONS:     If biopsies show H. pylori, you will started on the appropriate     antibiotics.  If the biopsies are negative, you should begin the     antiacid medicine that was recommended previously (omeprazole,     prevacid).     The nodule in stomach will likely need more formal evaluation     with endoscopic ultrasound, will set that up after biopsies     results are back.           ______________________________     Rachael Fee, MD           n.     eSIGNED:   Rachael Fee at 07/15/2010 11:19  AM           Margaret, [name], [id number]  Note: An exclamation mark (!) indicates a result that was not dispersed into the flowsheet. Document Creation Date: 07/15/2010 11:19 AM _______________________________________________________________________  (1) Order result status: Final Collection or observation date-time: 07/15/2010 11:11 Requested date-time:  Receipt date-time:  Reported date-time:  Referring Physician:   Ordering Physician: Rob Bunting [phone]) Specimen Source:  Source: Launa Grill Order Number: [id number] Lab site:

## 2010-07-20 NOTE — Procedures (Signed)
Summary: Colonoscopy/R Jasper Riling MD  Colonoscopy/R Jasper Riling MD   Imported By: Lanelle Bal 07/08/2010 11:29:18  _____________________________________________________________________  External Attachment:    Type:   Image     Comment:   External Document

## 2010-07-20 NOTE — Letter (Signed)
Summary: Diabetic Instructions  [facility]  [address] [place], [place] [address]   Phone: [phone]  Fax: [phone]    Margaret [name] 1950/12/26 MRN: [id number]   X  ORAL DIABETIC MEDICATION INSTRUCTIONS  The day before your procedure:   Take your diabetic pill as you do normally  The day of your procedure:   Do not take your diabetic pill    We will check your blood sugar levels during the admission process and again in Recovery before discharging you home  ________________________________________________________________________

## 2010-07-20 NOTE — Assessment & Plan Note (Signed)
History of Present Illness Visit Type: Initial Consult Primary GI MD: Rob Bunting MD Primary Provider: Dondra Spry DO Requesting Provider: Dondra Spry DO Chief Complaint: H Pylori  History of Present Illness:     very pleasant 60 year old woman who was recently found to have H. pylori + serology blood tests about a month ago.  she has not started antibiotics.   she has not tried antiacid meds given by PCP (prevacid).  she gets burning in her throat, sometimes very atypical dysphagia symptoms.  After she eats has mild early satiety.  Overall she has gained weight in past year (20 pounds).  never had ulcers.  no abdomianl pains.    I reviewed colonoscopiesThat had been entered in her medical record, she has never had precancerous polyps removed. Her last colonoscopy was a little over a year ago and she was recommended to have repeat colonoscopy at five-year interval unclear reasons.             Current Medications (verified): 1)  Crestor 5 Mg Tabs (Rosuvastatin Calcium) .... Take One Tablet By Mouth Daily. 2)  Multivitamins   Tabs (Multiple Vitamin) .... Once Daily 3)  Fish Oil   Oil (Fish Oil) .... Take 1 Capsule By Mouth Once A Day 4)  Calcium 500 Mg Tabs (Calcium) .... 2 Tablets By Mouth Once Daily 5)  Cpap .... As Directed 6)  Metformin Hcl 500 Mg Tabs (Metformin Hcl) .... ***not Taking Yet On Hold***1/2 By Mouth Two Times A Day X 1 Week, Then One By Mouth Two Times A Day 7)  Coq10 100 Mg Caps (Coenzyme Q10) .... One Capsule By Mouth Once Daily  Allergies (verified): 1)  Omeprazole (Omeprazole)  Past History:  Past Medical History: CHEST PAIN  HYPERLIPIDEMIA  SLEEP APNEA   GERD Obesity Routine risk for colon cancer, last colonoscopy in [place] 11 2010 found a hyperplastic polyp only  Past Surgical History: Dilation and Curettage    Family History: Significant for stroke in the Mother  at age 52 Family History Lung cancer - father No cancer No DM  II No colon cancer    Social History: Tobacco Use - No.  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  All other review of systems was otherwise negative.   Vital Signs:  Patient profile:   60 year old female Height:      61.25 inches Weight:      184 pounds BMI:     34.61 BSA:     1.83 Pulse rate:   88 / minute Pulse rhythm:   regular BP sitting:   132 / 80  (left arm) Cuff size:   regular  Vitals Entered By: Ok Anis CMA (July 11, 2010 1:13 PM)  Physical Exam  Additional Exam:  Constitutional: generally well appearing Psychiatric: alert and oriented times 3 Eyes: extraocular movements intact Mouth: oropharynx moist, no lesions Neck: supple, no lymphadenopathy Cardiovascular: heart regular rate and rythm Lungs: CTA bilaterally Abdomen: soft, non-tender, non-distended, no obvious ascites, no peritoneal signs, normal bowel sounds Extremities: no lower extremity edema bilaterally Skin: no lesions on visible extremities    Impression & Recommendations:  Problem # 1:  Dyphsagia-like symptoms, recently found to be H. pylori positive I think we should proceed with EGD to see if she really had H. pylori infection in her stomach. She also has dysphasia-like symptoms that are actually improved when she belches. I doubt she has underlying malignancy I will evaluate for that as well.  Problem # 2:  routine risk for colon cancer her last colonoscopy was in 2010 and she was recommended to have a repeat at five-year interval. That recommendation is not consistent with nationally accepted colon cancer screening guidelines.  I will put her in for recall colonoscopy at 10 year interval.  Patient Instructions: 1)  Recall colonoscopy for routine  risk 03/2019. 2)  You will be scheduled to have a EGD to check for H. pylori and also to investigate vague dysphagia-like symptoms. 3)  The medication list was reviewed and reconciled.  All changed / newly prescribed medications were explained.  A complete medication list was provided to the patient / caregiver.  Appended Document: Orders Update/EGD    Clinical Lists Changes  Orders: Added new Test order of EGD (EGD) - Signed

## 2010-07-20 NOTE — Letter (Signed)
Summary: EGD Instructions  [facility]  [address] [place], [place] [address]   Phone: [phone]  Fax: [phone]       Margaret [name]    08-20-1950    MRN: [id number]       Procedure Day /Date:07/15/10 FRI     Arrival Time: 10 am     Procedure Time:11 am     Location of Procedure:                    [facility] ([facility])    PREPARATION FOR ENDOSCOPY   On 07/15/10 THE DAY OF THE PROCEDURE:  1.   No solid foods, milk or milk products are allowed after midnight the night before your procedure.  2.   Do not drink anything colored red or purple.  Avoid juices with pulp.  No orange juice.  3.  You may drink clear liquids until 9 am, which is 2 hours before your procedure.                                                                                                CLEAR LIQUIDS INCLUDE: Water Jello Ice Popsicles Tea (sugar ok, no milk/cream) Powdered fruit flavored drinks Coffee (sugar ok, no milk/cream) Gatorade Juice: apple, white grape, white cranberry  Lemonade Clear bullion, consomm, broth Carbonated beverages (any kind) Strained chicken noodle soup Hard Candy   MEDICATION INSTRUCTIONS  Unless otherwise instructed, you should take regular prescription medications with a small sip of water as early as possible the morning of your procedure.  Diabetic patients - see separate instructions.               OTHER INSTRUCTIONS  You will need a responsible adult at least 58 years of age to accompany you and drive you home.   This person must remain in the waiting room during your procedure.  Wear loose fitting clothing that is easily removed.  Leave jewelry and other valuables at home.  However, you may wish to bring a book to read or an iPod/MP3 player to listen to music as you wait for your procedure to start.  Remove all body piercing jewelry and leave at home.  Total time from sign-in until discharge is  approximately 2-3 hours.  You should go home directly after your procedure and rest.  You can resume normal activities the day after your procedure.  The day of your procedure you should not:   Drive   Make legal decisions   Operate machinery   Drink alcohol   Return to work  You will receive specific instructions about eating, activities and medications before you leave.    The above instructions have been reviewed and explained to me by   _______________________    I fully understand and can verbalize these instructions _____________________________ Date _________

## 2010-07-26 ENCOUNTER — Telehealth: Payer: Self-pay | Admitting: Internal Medicine

## 2010-07-26 NOTE — Miscellaneous (Signed)
Summary: CPAP Supplies/[facility]  CPAP Supplies/[facility]   Imported By: Sherian Rein 07/18/2010 07:05:24  _____________________________________________________________________  External Attachment:    Type:   Image     Comment:   External Document

## 2010-07-27 ENCOUNTER — Encounter (INDEPENDENT_AMBULATORY_CARE_PROVIDER_SITE_OTHER): Payer: Self-pay | Admitting: *Deleted

## 2010-07-27 ENCOUNTER — Telehealth (INDEPENDENT_AMBULATORY_CARE_PROVIDER_SITE_OTHER): Payer: Self-pay | Admitting: *Deleted

## 2010-07-27 DIAGNOSIS — K319 Disease of stomach and duodenum, unspecified: Secondary | ICD-10-CM | POA: Insufficient documentation

## 2010-08-02 ENCOUNTER — Telehealth: Payer: Self-pay | Admitting: Gastroenterology

## 2010-08-04 ENCOUNTER — Encounter: Payer: BC Managed Care – PPO | Admitting: Gastroenterology

## 2010-08-04 NOTE — Letter (Signed)
Summary: EGD Instructions  [facility]  [address] [place], [place] [address]   Phone: [phone]  Fax: [phone]       Margaret [name]    04/13/1951    MRN: [id number]       Procedure Day /Date:08/04/10 [name]     Arrival Time: 815 am     Procedure Time:915 am     Location of Procedure:                     [facility] ( Outpatient Registration)    PREPARATION FOR ENDOSCOPY   On 08/04/10  THE DAY OF THE PROCEDURE:  1.   No solid foods, milk or milk products are allowed after midnight the night before your procedure.  2.   Do not drink anything colored red or purple.  Avoid juices with pulp.  No orange juice.  3.  You may drink clear liquids until 515 am , which is 4 hours before your procedure.                                                                                                CLEAR LIQUIDS INCLUDE: Water Jello Ice Popsicles Tea (sugar ok, no milk/cream) Powdered fruit flavored drinks Coffee (sugar ok, no milk/cream) Gatorade Juice: apple, white grape, white cranberry  Lemonade Clear bullion, consomm, broth Carbonated beverages (any kind) Strained chicken noodle soup Hard Candy   MEDICATION INSTRUCTIONS  Unless otherwise instructed, you should take regular prescription medications with a small sip of water as early as possible the morning of your procedure.  Diabetic patients - see separate instructions.               OTHER INSTRUCTIONS  You will need a responsible adult at least 58 years of age to accompany you and drive you home.   This person must remain in the waiting room during your procedure.  Wear loose fitting clothing that is easily removed.  Leave jewelry and other valuables at home.  However, you may wish to bring a book to read or an iPod/MP3 player to listen to music as you wait for your procedure to start.  Remove all body piercing jewelry and leave at home.  Total time from sign-in until  discharge is approximately 2-3 hours.  You should go home directly after your procedure and rest.  You can resume normal activities the day after your procedure.  The day of your procedure you should not:   Drive   Make legal decisions   Operate machinery   Drink alcohol   Return to work  You will receive specific instructions about eating, activities and medications before you leave.    The above instructions have been reviewed and explained to me by   Chales Abrahams CMA Duncan Dull)  July 27, 2010 2:47 PM     I fully understand and can verbalize these instructions over the phone mailed to home Date 07/27/10

## 2010-08-04 NOTE — Letter (Signed)
Summary: Diabetic Instructions  [facility]  [address]   [place], [place] [address]   Phone: [phone]  Fax: [phone]    Margaret [name] 01/01/1951 MRN: [id number]   X  ORAL DIABETIC MEDICATION INSTRUCTIONS  The day before your procedure:   Take your diabetic pill as you do normally  The day of your procedure:   Do not take your diabetic pill    We will check your blood sugar levels during the admission process and again in Recovery before discharging you home  ________________________________________________________________________  

## 2010-08-04 NOTE — Progress Notes (Signed)
Summary: Records request   Phone Note Call from Patient Call back at [phone]   Caller: Patient Call For: D. Thomos Lemons DO Summary of Call: patient called and requested her blood work faxed to a physician in [place]. She was asked for the provider information. She stated she does not remember the name of the provider but the fax number she provided was  [phone]. She was informed a signed medical release was needed from the requesting physician or she could sign one here in the office. She stated that she would contact the provider and inform there office.  Initial call taken by: Glendell Docker CMA,  July 26, 2010 4:45 PM

## 2010-08-09 LAB — COMPREHENSIVE METABOLIC PANEL
Alkaline Phosphatase: 70 U/L (ref 39–117)
BUN: 13 mg/dL (ref 6–23)
CO2: 28 mEq/L (ref 19–32)
Chloride: 108 mEq/L (ref 96–112)
Glucose, Bld: 112 mg/dL — ABNORMAL HIGH (ref 70–99)
Potassium: 3.9 mEq/L (ref 3.5–5.1)
Total Bilirubin: 0.8 mg/dL (ref 0.3–1.2)

## 2010-08-09 LAB — CK TOTAL AND CKMB (NOT AT ARMC): Relative Index: INVALID (ref 0.0–2.5)

## 2010-08-09 LAB — CBC
HCT: 36.8 % (ref 36.0–46.0)
MCH: 29.8 pg (ref 26.0–34.0)
MCHC: 34.5 g/dL (ref 30.0–36.0)
MCV: 86.4 fL (ref 78.0–100.0)
RDW: 13.4 % (ref 11.5–15.5)

## 2010-08-09 LAB — HEPATIC FUNCTION PANEL
ALT: 75 U/L — ABNORMAL HIGH (ref 0–35)
AST: 56 U/L — ABNORMAL HIGH (ref 0–37)
Alkaline Phosphatase: 79 U/L (ref 39–117)
Bilirubin, Direct: 0.1 mg/dL (ref 0.0–0.3)
Indirect Bilirubin: 0.5 mg/dL (ref 0.3–0.9)

## 2010-08-09 LAB — TROPONIN I: Troponin I: 0.01 ng/mL (ref 0.00–0.06)

## 2010-08-09 NOTE — Progress Notes (Signed)
Summary: EUS  Phone Note Outgoing Call Call back at [facility] Phone [phone]   Call placed by: Chales Abrahams CMA Duncan Dull),  July 27, 2010 2:45 PM Summary of Call: pt scheduled for EUS need to review meds and instruct pt , she is diabetic. no answer and unable to leave a voice message. Initial call taken by: Chales Abrahams CMA Duncan Dull),  July 27, 2010 2:46 PM  Follow-up for Phone Call        no answer unable to leave voice message box was full.  Instructions mailed to the home. Follow-up by: Chales Abrahams CMA Duncan Dull),  July 28, 2010 9:07 AM  Additional Follow-up for Phone Call Additional follow up Details #1::        no answer voice mail still full not accepting messages. Chales Abrahams CMA Duncan Dull)  July 28, 2010 4:16 PM    Received your EUS letter & has questions.  Leanor Kail [facility]  August 01, 2010 1:04 PM    Additional Follow-up for Phone Call Additional follow up Details #2::    tried to reach pt but still unable to leave a message voice mail is full. Follow-up by: Chales Abrahams CMA Duncan Dull),  August 01, 2010 4:40 PM

## 2010-08-09 NOTE — Progress Notes (Signed)
Summary: Resch'd EUS  Phone Note Call from Patient Call back at Home Phone [phone]   Caller: Patient Call For: Dr. Christella Hartigan Reason for Call: Talk to Nurse Summary of Call: Needs to r/s her EUS procedure Initial call taken by: Karna Christmas,  August 02, 2010 1:41 PM  Follow-up for Phone Call        pt appt changed to 08/25/10 per pt request Follow-up by: Chales Abrahams CMA (AAMA),  August 02, 2010 1:49 PM

## 2010-08-10 LAB — DIFFERENTIAL
Eosinophils Absolute: 0.3 10*3/uL (ref 0.0–0.7)
Lymphocytes Relative: 44 % (ref 12–46)
Lymphs Abs: 2.6 10*3/uL (ref 0.7–4.0)
Monocytes Relative: 10 % (ref 3–12)
Neutrophils Relative %: 39 % — ABNORMAL LOW (ref 43–77)

## 2010-08-10 LAB — BASIC METABOLIC PANEL
CO2: 26 mEq/L (ref 19–32)
Calcium: 8.9 mg/dL (ref 8.4–10.5)
Chloride: 106 mEq/L (ref 96–112)
GFR calc Af Amer: >60 mL/min (ref >60)
Sodium: 146 mEq/L — ABNORMAL HIGH (ref 135–145)

## 2010-08-10 LAB — CBC
Hemoglobin: 13.7 g/dL (ref 12.0–15.0)
MCH: 29.8 pg (ref 26.0–34.0)
Platelets: 207 10*3/uL (ref 150–400)
RBC: 4.61 MIL/uL (ref 3.87–5.11)
WBC: 5.9 10*3/uL (ref 4.0–10.5)

## 2010-08-10 LAB — CK TOTAL AND CKMB (NOT AT ARMC): Total CK: 77 U/L (ref 7–177)

## 2010-08-10 LAB — POCT CARDIAC MARKERS
CKMB, poc: <1 ng/mL — ABNORMAL LOW (ref 1.0–8.0)
Myoglobin, poc: 39.1 ng/mL (ref 12–200)
Myoglobin, poc: 44 ng/mL (ref 12–200)
Troponin i, poc: <0.05 ng/mL (ref 0.00–0.09)

## 2010-08-10 LAB — D-DIMER, QUANTITATIVE: D-Dimer, Quant: <0.22 ug/mL-FEU (ref 0.00–0.48)

## 2010-08-25 ENCOUNTER — Encounter: Payer: Self-pay | Admitting: Gastroenterology

## 2010-08-25 ENCOUNTER — Encounter: Payer: BC Managed Care – PPO | Admitting: Gastroenterology

## 2010-08-25 ENCOUNTER — Ambulatory Visit (HOSPITAL_COMMUNITY)
Admission: RE | Admit: 2010-08-25 | Discharge: 2010-08-25 | Disposition: A | Discharge status: Home / Self Care | Payer: BC Managed Care – PPO | Source: Ambulatory Visit | Attending: Gastroenterology | Admitting: Gastroenterology

## 2010-08-25 DIAGNOSIS — R933 Abnormal findings on diagnostic imaging of other parts of digestive tract: Secondary | ICD-10-CM

## 2010-08-25 DIAGNOSIS — K228 Other specified diseases of esophagus: Secondary | ICD-10-CM | POA: Insufficient documentation

## 2010-08-25 DIAGNOSIS — K2970 Gastritis, unspecified, without bleeding: Secondary | ICD-10-CM | POA: Insufficient documentation

## 2010-08-25 DIAGNOSIS — K2990 Gastroduodenitis, unspecified, without bleeding: Secondary | ICD-10-CM

## 2010-08-25 DIAGNOSIS — K2289 Other specified disease of esophagus: Secondary | ICD-10-CM | POA: Insufficient documentation

## 2010-08-30 ENCOUNTER — Telehealth: Payer: Self-pay | Admitting: Internal Medicine

## 2010-08-30 ENCOUNTER — Ambulatory Visit (INDEPENDENT_AMBULATORY_CARE_PROVIDER_SITE_OTHER): Payer: BC Managed Care – PPO | Admitting: Pulmonary Disease

## 2010-08-30 ENCOUNTER — Encounter: Payer: Self-pay | Admitting: Pulmonary Disease

## 2010-08-30 VITALS — BP 110/60 | HR 77 | Temp 98.3°F | Ht 62.0 in | Wt 181.0 lb

## 2010-08-30 DIAGNOSIS — K219 Gastro-esophageal reflux disease without esophagitis: Secondary | ICD-10-CM

## 2010-08-30 DIAGNOSIS — E785 Hyperlipidemia, unspecified: Secondary | ICD-10-CM

## 2010-08-30 DIAGNOSIS — G4730 Sleep apnea, unspecified: Secondary | ICD-10-CM

## 2010-08-30 NOTE — Assessment & Plan Note (Signed)
Discussed alternative modes of treatment of obstructive sleep apnea including oral apliances & surgery. Most effective  Rx would be CPAP 10 cm - cardiovascular beenfits discussed. Weight loss encouraged, compliance with goal of at least 4-6 hrs every night is the expectation. Advised against medications with sedative side effects Cautioned against driving when sleepy - understanding that sleepiness will vary on a day to day basis

## 2010-08-30 NOTE — Progress Notes (Signed)
  Subjective:    Patient ID: Margaret [name], female    DOB: [birth year], 60 y.o.   MRN: [id number]  HPI 59/F, Filipino woman, never smoker, for management of obstructive sleep apnea. She moved from [place] 1 yr ago 2 yrs ago, she had PSG in [place] , put on CPAP ? pressure ([employer]) with swift nasal pillows Uses 4-5 h, takes it off for [facility] visit Bedtime MN to 1 A, used to be 10p, stays in bed sometimes until 8A or 10A, rested with cpap sleeps on side x 2 pillows, Epworth Sleepiness Score 19 gained 20 lbs over last 2 y If does not use the CPAP, dry mouth  June 28, 2010 11:38 AM  Reviewed PSG 1/09 >> AHI 49/h, lowest desatn 71%, , corrected by CPAP 9 cm, placed on auto CPAP with nasal pillows  08/30/2010 She reports better compliance with new nasal mask , at least 4 h Acid reflux is an issue Travelling to [place] x  2months There is no history suggestive of cataplexy, sleep paralysis or parasomnias     Review of Systems Pt denies any significant  nasal congestion or excess secretions, fever, chills, sweats, unintended wt loss, pleuritic or exertional cp, orthopnea pnd or leg swelling.  Pt also denies any obvious fluctuation in symptoms with weather or environmental change or other alleviating or aggravating factors.    Pt denies any increase in rescue therapy over baseline, denies waking up needing it or having early am exacerbations or coughing/wheezing/ or dyspnea      Objective:   Physical Exam        Assessment & Plan:

## 2010-08-30 NOTE — Telephone Encounter (Signed)
Ok for rx for 2 month supply

## 2010-08-30 NOTE — Patient Instructions (Addendum)
Stay on your CPAP machine Take it with you when you travel Your machine corrects obstructive sleep apnea when you use it Aim for 4-5 hrs every night

## 2010-08-30 NOTE — Telephone Encounter (Signed)
PATIENT IS GOING OUT OF THE COUNTRY AND NEEDS 2 MONTHS OF MEDS TO TAKE WITH HER.  SHE NEEDS CRESTOR 5 MG TAKE 1 PER DAY.  [facility] SEND TO [facility] ON [address].  PLEASE CALL HER TO LET HER KNOW IF THIS CAN BE HANDLED

## 2010-08-30 NOTE — Procedures (Signed)
Summary: Endoscopic Ultrasound  Patient: Margaret [name] Note: All result statuses are Final unless otherwise noted.  Tests: (1) Endoscopic Ultrasound (EUS)  EUS Endoscopic Ultrasound                             DONE     [facility]     [address] [place], [place]  [address]          ENDOSCOPIC ULTRASOUND PROCEDURE REPORT          PATIENT:  Margaret, [name]  MR#:  [id number]     BIRTHDATE:  January 30, 1951  GENDER:  female     ENDOSCOPIST:  Rachael Fee, MD     PROCEDURE DATE:  08/25/2010     PROCEDURE:  Upper EUS     ASA CLASS:  Class II     INDICATIONS:  incidentally noted gastric submucosal lesion on     recent EGD (Dr. Christella Hartigan last month)     MEDICATIONS:  Fentanyl 100 mcg IV, Versed 9 mg IV          DESCRIPTION OF PROCEDURE:   After the risks benefits and     alternatives of the procedure were  explained, informed consent     was obtained. The patient was then placed in the left, lateral,     decubitus postion and IV sedation was administered. Throughout the     procedure, the patient's blood pressure, pulse and oxygen     saturations were monitored continuously.  Under direct     visualization, the  endoscope was introduced through the mouth and     advanced to the second portion of the duodenum.  Water was used as     necessary to provide an acoustic interface.  Upon completion of     the imaging, water was removed and the patient was sent to the     recovery room in satisfactory condition.     <<PROCEDUREIMAGES>>          Endoscopic findings:1. Previously noted 1-2cm subepthelial bulge     on gastric side of GE junction.     2. Mild gastritis.     3. Normal duodenum.          EUS findings:     1. The bulge described above corresponded with an anechoic 1.1cm     by 3.17mm lesion. This was consistent with esophageal duplication     cyst and no FNA was performed.          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  It is very unlikely that this is     causing any symptoms and does not require surveillance imaging.          ______________________________     Rachael Fee, MD          cc: Margaret Lemons, DO          n.     eSIGNED:   Rachael Fee at 08/25/2010 09:34 AM          Margaret [name], [id number]  Note: An exclamation mark (!) indicates a result that was not dispersed into the flowsheet. Document Creation Date: 08/25/2010 9:35 AM _______________________________________________________________________  (1) Order result status: Final Collection or observation date-time: 08/25/2010 09:27 Requested date-time:  Receipt date-time:  Reported date-time:  Referring Physician:   Ordering Physician: Rob Bunting [phone]) Specimen Source:  Source: Launa Grill Order Number: [id number] Lab site:

## 2010-08-31 MED ORDER — ROSUVASTATIN CALCIUM 5 MG PO TABS: 5.0000 mg | ORAL_TABLET | Freq: Every day | ORAL | Status: DC

## 2010-08-31 NOTE — Telephone Encounter (Signed)
Addended by: Glendell Docker on: 08/31/2010 09:00 AM   Modules accepted: Orders

## 2010-08-31 NOTE — Telephone Encounter (Signed)
Call placed to patient at [phone], no answer. A detailed voice message was left informing patient  medication sent to pharmacy

## 2010-09-21 ENCOUNTER — Encounter: Payer: Self-pay | Admitting: Pulmonary Disease

## 2011-05-16 ENCOUNTER — Ambulatory Visit (INDEPENDENT_AMBULATORY_CARE_PROVIDER_SITE_OTHER): Payer: BC Managed Care – PPO | Admitting: Internal Medicine

## 2011-05-16 ENCOUNTER — Encounter: Payer: Self-pay | Admitting: Internal Medicine

## 2011-05-16 VITALS — BP 104/72 | HR 67 | Temp 98.3°F | Resp 18 | Ht 62.0 in | Wt 182.0 lb

## 2011-05-16 DIAGNOSIS — E119 Type 2 diabetes mellitus without complications: Secondary | ICD-10-CM

## 2011-05-16 DIAGNOSIS — K219 Gastro-esophageal reflux disease without esophagitis: Secondary | ICD-10-CM

## 2011-05-16 DIAGNOSIS — E785 Hyperlipidemia, unspecified: Secondary | ICD-10-CM

## 2011-05-16 DIAGNOSIS — R7309 Other abnormal glucose: Secondary | ICD-10-CM

## 2011-05-16 LAB — HEPATIC FUNCTION PANEL
Albumin: 4.3 g/dL (ref 3.5–5.2)
Alkaline Phosphatase: 83 U/L (ref 39–117)
Total Bilirubin: 0.3 mg/dL (ref 0.3–1.2)
Total Protein: 7.1 g/dL (ref 6.0–8.3)

## 2011-05-16 LAB — BASIC METABOLIC PANEL
CO2: 28 mEq/L (ref 19–32)
Chloride: 104 mEq/L (ref 96–112)
Creat: 0.63 mg/dL (ref 0.50–1.10)
Potassium: 4.5 mEq/L (ref 3.5–5.3)
Sodium: 141 mEq/L (ref 135–145)

## 2011-05-16 LAB — LIPID PANEL
HDL: 51 mg/dL (ref >39)
LDL Cholesterol: 127 mg/dL — ABNORMAL HIGH (ref 0–99)
Total CHOL/HDL Ratio: 3.8 Ratio
Triglycerides: 71 mg/dL (ref <150)

## 2011-05-16 LAB — CBC
MCV: 87.4 fL (ref 78.0–100.0)
Platelets: 245 10*3/uL (ref 150–400)
RBC: 4.54 MIL/uL (ref 3.87–5.11)
RDW: 14 % (ref 11.5–15.5)
WBC: 5.9 10*3/uL (ref 4.0–10.5)

## 2011-05-16 MED ORDER — ROSUVASTATIN CALCIUM 5 MG PO TABS: 5.0000 mg | ORAL_TABLET | Freq: Every day | ORAL | Status: DC

## 2011-05-16 NOTE — Progress Notes (Signed)
  Subjective:    Patient ID: Roswell Nickel, female    DOB: 1951/01/08, 60 y.o.   MRN: [id number]  HPI Pt presents to clinic for followup of multiple medical problems. Tolerating statin tx without myalgias. Notes gerd sx's 3+ days/wk. Previously took Brewing technologist without difficulty but stopped the medication. H/o borderline DM and never took metformin. Last known a1c 6.4 05/2010. States colonoscopy and mammogram utd 2012. No other complaints.  Past Medical History  Diagnosis Date  . Chest pain   . Hyperlipidemia   . Sleep apnea   . GERD (gastroesophageal reflux disease)   . Obesity   . At risk for colon cancer     Last colonoscopy in [place] 03/2009 found hyperplastic polyp only   Past Surgical History  Procedure Date  . Dilation and curettage of uterus     reports that she has never smoked. She does not have any smokeless tobacco history on file. Her alcohol and drug histories not on file. family history includes Lung cancer in her father and Stroke in her mother. Allergies  Allergen Reactions  . Omeprazole     REACTION: dizziness      Review of Systems see hpi     Objective:   Physical Exam  Physical Exam  Nursing note and vitals reviewed. Constitutional: Appears well-developed and well-nourished. No distress.  HENT:  Head: Normocephalic and atraumatic.  Right Ear: External ear normal.  Left Ear: External ear normal.  Eyes: Conjunctivae are normal. No scleral icterus.  Neck: Neck supple. Carotid bruit is not present.  Cardiovascular: Normal rate, regular rhythm and normal heart sounds.  Exam reveals no gallop and no friction rub.   No murmur heard. Pulmonary/Chest: Effort normal and breath sounds normal. No respiratory distress. He has no wheezes. no rales.  Lymphadenopathy:    He has no cervical adenopathy.  Neurological:Alert.  Skin: Skin is warm and dry. Not diaphoretic.  Psychiatric: Has a normal mood and affect.        Assessment & Plan:

## 2011-05-16 NOTE — Assessment & Plan Note (Signed)
Resume prilosec 20mg  qd. Followup if no improvement or worsening.

## 2011-05-16 NOTE — Assessment & Plan Note (Signed)
Rf crestor. Obtain lipid/lft.

## 2011-05-16 NOTE — Assessment & Plan Note (Signed)
Obtain cbc, chem7, a1c and urine microalbumin. Encourage low sugar/carb diet, regular aerobic exercise and wt loss.

## 2011-05-17 LAB — HEMOGLOBIN A1C: Hgb A1c MFr Bld: 6 % — ABNORMAL HIGH (ref <5.7)

## 2011-05-17 LAB — MICROALBUMIN / CREATININE URINE RATIO: Microalb, Ur: 3.06 mg/dL — ABNORMAL HIGH (ref 0.00–1.89)

## 2011-09-11 ENCOUNTER — Encounter: Payer: Self-pay | Admitting: Internal Medicine

## 2011-09-11 ENCOUNTER — Ambulatory Visit (INDEPENDENT_AMBULATORY_CARE_PROVIDER_SITE_OTHER): Payer: BC Managed Care – PPO | Admitting: Internal Medicine

## 2011-09-11 VITALS — BP 100/60 | HR 62 | Temp 98.5°F | Resp 18 | Ht 62.0 in | Wt 177.0 lb

## 2011-09-11 DIAGNOSIS — R7309 Other abnormal glucose: Secondary | ICD-10-CM

## 2011-09-11 DIAGNOSIS — E785 Hyperlipidemia, unspecified: Secondary | ICD-10-CM

## 2011-09-11 DIAGNOSIS — E119 Type 2 diabetes mellitus without complications: Secondary | ICD-10-CM

## 2011-09-11 DIAGNOSIS — K219 Gastro-esophageal reflux disease without esophagitis: Secondary | ICD-10-CM

## 2011-09-11 LAB — BASIC METABOLIC PANEL
CO2: 28 mEq/L (ref 19–32)
Chloride: 103 mEq/L (ref 96–112)
Creat: 0.68 mg/dL (ref 0.50–1.10)
Potassium: 4.5 mEq/L (ref 3.5–5.3)
Sodium: 139 mEq/L (ref 135–145)

## 2011-09-11 LAB — LIPID PANEL
Cholesterol: 211 mg/dL — ABNORMAL HIGH (ref 0–200)
HDL: 55 mg/dL (ref >39)
Total CHOL/HDL Ratio: 3.8 Ratio
Triglycerides: 100 mg/dL (ref <150)
VLDL: 20 mg/dL (ref 0–40)

## 2011-09-11 LAB — HEPATIC FUNCTION PANEL
ALT: 46 U/L — ABNORMAL HIGH (ref 0–35)
AST: 38 U/L — ABNORMAL HIGH (ref 0–37)
Bilirubin, Direct: 0.1 mg/dL (ref 0.0–0.3)
Total Protein: 7.1 g/dL (ref 6.0–8.3)

## 2011-09-11 MED ORDER — PANTOPRAZOLE SODIUM 40 MG PO TBEC: 40.0000 mg | DELAYED_RELEASE_TABLET | Freq: Every day | ORAL | Status: DC

## 2011-09-11 NOTE — Progress Notes (Signed)
  Subjective:    Patient ID: Margaret [name], female    DOB: 01-Apr-1951, 61 y.o.   MRN: [id number]  HPI Pt presents to clinic for followup of multiple medical problems. Notes daily GERD sx's worse at night. Has dysphagia for solid food and pills. No abd pain or blood in stool. Took prilosec otc for 14 days only. States had EGD ?last year. Has changed her diet and sx's persist. Was taking crestor 5mg  QOD until last labs showed suboptimal LDL. Now taking statin QD. No other complaints.  Past Medical History  Diagnosis Date  . Chest pain   . Hyperlipidemia   . Sleep apnea   . GERD (gastroesophageal reflux disease)   . Obesity   . At risk for colon cancer     Last colonoscopy in [place] 03/2009 found hyperplastic polyp only   Past Surgical History  Procedure Date  . Dilation and curettage of uterus     reports that she has never smoked. She has never used smokeless tobacco. She reports that she drinks alcohol. She reports that she does not use illicit drugs. family history includes Lung cancer in her father and Stroke in her mother. Allergies  Allergen Reactions  . Omeprazole     REACTION: dizziness      Review of Systems see hpi     Objective:   Physical Exam  Physical Exam  Nursing note and vitals reviewed. Constitutional: Appears well-developed and well-nourished. No distress.  HENT:  Head: Normocephalic and atraumatic.  Right Ear: External ear normal.  Left Ear: External ear normal.  Eyes: Conjunctivae are normal. No scleral icterus.  Neck: Neck supple. Carotid bruit is not present.  Cardiovascular: Normal rate, regular rhythm and normal heart sounds.  Exam reveals no gallop and no friction rub.   No murmur heard. Pulmonary/Chest: Effort normal and breath sounds normal. No respiratory distress. He has no wheezes. no rales.  Lymphadenopathy:    He has no cervical adenopathy.  Neurological:Alert.  Skin: Skin is warm and dry. Not diaphoretic.  Psychiatric: Has a normal mood  and affect.        Assessment & Plan:

## 2011-09-11 NOTE — Assessment & Plan Note (Signed)
Attempt protonix 40mg  qd. F/u one month for re-evaluation.

## 2011-09-11 NOTE — Assessment & Plan Note (Signed)
Obtain chem7 and a1c 

## 2011-09-11 NOTE — Assessment & Plan Note (Signed)
Now taking statin qd. Obtain lipid/lft

## 2011-09-12 LAB — HEMOGLOBIN A1C: Mean Plasma Glucose: 126 mg/dL — ABNORMAL HIGH (ref <117)

## 2011-09-15 ENCOUNTER — Telehealth: Payer: Self-pay | Admitting: *Deleted

## 2011-09-15 DIAGNOSIS — E785 Hyperlipidemia, unspecified: Secondary | ICD-10-CM

## 2011-09-15 MED ORDER — ROSUVASTATIN CALCIUM 10 MG PO TABS: 10.0000 mg | ORAL_TABLET | ORAL | Status: DC

## 2011-09-15 NOTE — Telephone Encounter (Signed)
Call placed to patient regarding lab results. She was advised per Dr Rodena Medin instructions. A Rx for Crestor 10 mg once a day and labs for May 2013 have been entered.

## 2011-10-12 ENCOUNTER — Ambulatory Visit (INDEPENDENT_AMBULATORY_CARE_PROVIDER_SITE_OTHER): Payer: BC Managed Care – PPO | Admitting: Internal Medicine

## 2011-10-12 ENCOUNTER — Telehealth: Payer: Self-pay | Admitting: Internal Medicine

## 2011-10-12 ENCOUNTER — Encounter: Payer: Self-pay | Admitting: Internal Medicine

## 2011-10-12 VITALS — BP 112/72 | HR 71 | Temp 98.1°F | Resp 18 | Wt 178.0 lb

## 2011-10-12 DIAGNOSIS — E785 Hyperlipidemia, unspecified: Secondary | ICD-10-CM

## 2011-10-12 DIAGNOSIS — R739 Hyperglycemia, unspecified: Secondary | ICD-10-CM

## 2011-10-12 DIAGNOSIS — K219 Gastro-esophageal reflux disease without esophagitis: Secondary | ICD-10-CM

## 2011-10-12 LAB — LIPID PANEL
Cholesterol: 174 mg/dL (ref 0–200)
VLDL: 21 mg/dL (ref 0–40)

## 2011-10-12 LAB — HEPATIC FUNCTION PANEL
ALT: 30 U/L (ref 0–35)
Alkaline Phosphatase: 77 U/L (ref 39–117)
Bilirubin, Direct: 0.1 mg/dL (ref 0.0–0.3)
Indirect Bilirubin: 0.4 mg/dL (ref 0.0–0.9)

## 2011-10-12 MED ORDER — OMEPRAZOLE 40 MG PO CPDR: 40.0000 mg | DELAYED_RELEASE_CAPSULE | Freq: Every day | ORAL | Status: DC

## 2011-10-12 NOTE — Telephone Encounter (Signed)
Lab order entered for October 2013. 

## 2011-10-12 NOTE — Assessment & Plan Note (Signed)
Obtain lipid/lft. 

## 2011-10-12 NOTE — Assessment & Plan Note (Signed)
Stop protonix due to side effects. Attempt omeprazole qd. Followup if no improvement or worsening.

## 2011-10-12 NOTE — Patient Instructions (Signed)
Please schedule fasting labs prior to next visit Lipid/lft-272.4 and chem7-hyperglycemia 

## 2011-10-12 NOTE — Progress Notes (Signed)
  Subjective:    Patient ID: Margaret [name], female    DOB: 1951-03-05, 61 y.o.   MRN: [id number]  HPI Pt presents to clinic for followup of multiple medical problems. Notes improvement of dysphagia and gerd sx's with protonix but stopped due to ?palpitations. Recalls outside GI EGD ?last year. Tolerating dose increase of crestor.  Past Medical History  Diagnosis Date  . Chest pain   . Hyperlipidemia   . Sleep apnea   . GERD (gastroesophageal reflux disease)   . Obesity   . At risk for colon cancer     Last colonoscopy in [place] 03/2009 found hyperplastic polyp only   Past Surgical History  Procedure Date  . Dilation and curettage of uterus     reports that she has never smoked. She has never used smokeless tobacco. She reports that she drinks alcohol. She reports that she does not use illicit drugs. family history includes Lung cancer in her father and Stroke in her mother. No Active Allergies    Review of Systems see hpi     Objective:   Physical Exam  Nursing note and vitals reviewed. Constitutional: She appears well-developed and well-nourished.  HENT:  Head: Normocephalic and atraumatic.  Neurological: She is alert.  Psychiatric: She has a normal mood and affect.          Assessment & Plan:

## 2012-03-05 LAB — BASIC METABOLIC PANEL
BUN: 14 mg/dL (ref 6–23)
CO2: 30 mEq/L (ref 19–32)
Chloride: 103 mEq/L (ref 96–112)
Glucose, Bld: 97 mg/dL (ref 70–99)
Potassium: 4.4 mEq/L (ref 3.5–5.3)
Sodium: 140 mEq/L (ref 135–145)

## 2012-03-05 LAB — LIPID PANEL
HDL: 51 mg/dL (ref >39)
Total CHOL/HDL Ratio: 4.3 Ratio
Triglycerides: 108 mg/dL (ref <150)

## 2012-03-05 LAB — HEPATIC FUNCTION PANEL
ALT: 30 U/L (ref 0–35)
Bilirubin, Direct: 0.1 mg/dL (ref 0.0–0.3)
Indirect Bilirubin: 0.5 mg/dL (ref 0.0–0.9)

## 2012-03-05 NOTE — Addendum Note (Signed)
Addended by: Mervin Kung A on: 03/05/2012 02:59 PM   Modules accepted: Orders

## 2012-03-05 NOTE — Telephone Encounter (Signed)
Pt presented to the lab, orders released. 

## 2012-03-14 ENCOUNTER — Ambulatory Visit: Payer: BC Managed Care – PPO | Admitting: Internal Medicine

## 2012-03-18 ENCOUNTER — Ambulatory Visit: Payer: BC Managed Care – PPO | Admitting: Internal Medicine

## 2012-03-25 ENCOUNTER — Encounter: Payer: Self-pay | Admitting: Internal Medicine

## 2012-03-25 ENCOUNTER — Ambulatory Visit (INDEPENDENT_AMBULATORY_CARE_PROVIDER_SITE_OTHER): Payer: BC Managed Care – PPO | Admitting: Internal Medicine

## 2012-03-25 VITALS — BP 122/70 | HR 66 | Temp 98.3°F | Resp 12 | Wt 178.1 lb

## 2012-03-25 DIAGNOSIS — M222X1 Patellofemoral disorders, right knee: Secondary | ICD-10-CM | POA: Insufficient documentation

## 2012-03-25 DIAGNOSIS — E785 Hyperlipidemia, unspecified: Secondary | ICD-10-CM

## 2012-03-25 DIAGNOSIS — M549 Dorsalgia, unspecified: Secondary | ICD-10-CM

## 2012-03-25 DIAGNOSIS — J310 Chronic rhinitis: Secondary | ICD-10-CM

## 2012-03-25 DIAGNOSIS — T7840XA Allergy, unspecified, initial encounter: Secondary | ICD-10-CM | POA: Insufficient documentation

## 2012-03-25 DIAGNOSIS — M25569 Pain in unspecified knee: Secondary | ICD-10-CM

## 2012-03-25 DIAGNOSIS — K219 Gastro-esophageal reflux disease without esophagitis: Secondary | ICD-10-CM

## 2012-03-25 HISTORY — DX: Allergy, unspecified, initial encounter: T78.40XA

## 2012-03-25 MED ORDER — ESOMEPRAZOLE MAGNESIUM 40 MG PO CPDR: 40.0000 mg | DELAYED_RELEASE_CAPSULE | Freq: Every day | ORAL | Status: DC

## 2012-03-25 MED ORDER — FLUTICASONE PROPIONATE 50 MCG/ACT NA SUSP: 2.0000 | Freq: Every day | NASAL | Status: DC

## 2012-03-25 NOTE — Patient Instructions (Signed)
Please schedule fasting labs prior to next visit Lipid/lft-272.4 

## 2012-03-25 NOTE — Assessment & Plan Note (Signed)
Begin physical therapy

## 2012-03-25 NOTE — Assessment & Plan Note (Signed)
Encouraged need for treatment. Given samples of Nexium 40 mg daily #15. Call if does not control symptoms or if needs prescription.

## 2012-03-25 NOTE — Assessment & Plan Note (Signed)
Physical therapy referral 

## 2012-03-25 NOTE — Progress Notes (Signed)
  Subjective:    Patient ID: Margaret [name], female    DOB: 06-May-1951, 61 y.o.   MRN: [id number]  HPI Pt presents to clinic for followup of multiple medical problems. Notes some degree of chronic intermittent low back pain without radicular pain and also intermittent right knee pain. Wishes to avoid medication for this. Has GERD symptoms occur more than 3 days out of the week. Underwent EGD 2012. Had side effects from Protonix and stopped omeprazole. Also notes mucus in throat in the morning and also possible intermittent sinus headaches. Reviewed elevated cholesterol and states is taking Crestor every other day. Declines influenza vaccine.  Past Medical History  Diagnosis Date  . Chest pain   . Hyperlipidemia   . Sleep apnea   . GERD (gastroesophageal reflux disease)   . Obesity   . At risk for colon cancer     Last colonoscopy in [place] 03/2009 found hyperplastic polyp only   Past Surgical History  Procedure Date  . Dilation and curettage of uterus     reports that she has never smoked. She has never used smokeless tobacco. She reports that she drinks alcohol. She reports that she does not use illicit drugs. family history includes Lung cancer in her father and Stroke in her mother. No Active Allergies    Review of Systems see hpi     Objective:   Physical Exam  Physical Exam  Nursing note and vitals reviewed. Constitutional: Appears well-developed and well-nourished. No distress.  HENT:  Head: Normocephalic and atraumatic.  Right Ear: External ear normal.  Left Ear: External ear normal.  Eyes: Conjunctivae are normal. No scleral icterus.  Neck: Neck supple. Carotid bruit is not present.  Cardiovascular: Normal rate, regular rhythm and normal heart sounds.  Exam reveals no gallop and no friction rub.   No murmur heard. Pulmonary/Chest: Effort normal and breath sounds normal. No respiratory distress. He has no wheezes. no rales.  Lymphadenopathy:    He has no cervical  adenopathy.  Neurological:Alert.  Skin: Skin is warm and dry. Not diaphoretic.  Psychiatric: Has a normal mood and affect.        Assessment & Plan:

## 2012-03-25 NOTE — Assessment & Plan Note (Addendum)
Instructed take Crestor daily. Repeat lipid and LFT next visit

## 2012-03-25 NOTE — Assessment & Plan Note (Signed)
Attempt Flonase

## 2012-05-09 ENCOUNTER — Other Ambulatory Visit: Payer: Self-pay | Admitting: Internal Medicine

## 2012-05-10 NOTE — Telephone Encounter (Signed)
Requested dispense # from pharmacy is #30. Refill sent #30 x 3 refills as previous rx.  Medication name:  Name from pharmacy:  CRESTOR 10 MG tablet  CRESTOR 10 MG TABLET Sig: TAKE 1 TABLET BY MOUTH EVERY OTHER DAY Dispense: 30 tablet Refills: 0 Start: 05/09/2012 Class: Normal Requested on: 09/15/2011 Originally ordered on: 08/30/2010 Last refill: 04/05/2012

## 2012-05-20 NOTE — Addendum Note (Signed)
Addended by: Regis Bill on: 05/20/2012 10:19 AM   Modules accepted: Orders

## 2012-05-21 ENCOUNTER — Telehealth: Payer: Self-pay | Admitting: *Deleted

## 2012-05-21 NOTE — Telephone Encounter (Signed)
Pt called RE: future labs, reporting that she would like to have them done early [OV scheduled for 01.28.14], prior to end of December '13 d/t "emergency on Sunday". Return call to pt; [facility] with contact name & number to return call RE: "emergency on Sunday" , as we do not have any visit listed in our system & more information is needed, as we normally see pt in office 7-10 days post UC/ED [hospital] visit/SLS

## 2012-06-25 ENCOUNTER — Ambulatory Visit: Payer: BC Managed Care – PPO | Admitting: Family Medicine

## 2012-07-01 ENCOUNTER — Telehealth: Payer: Self-pay

## 2012-07-01 DIAGNOSIS — E785 Hyperlipidemia, unspecified: Secondary | ICD-10-CM

## 2012-07-01 LAB — HEPATIC FUNCTION PANEL
ALT: 26 U/L (ref 0–35)
Total Protein: 6.8 g/dL (ref 6.0–8.3)

## 2012-07-01 LAB — LIPID PANEL
Cholesterol: 165 mg/dL (ref 0–200)
LDL Cholesterol: 101 mg/dL — ABNORMAL HIGH (ref 0–99)
Total CHOL/HDL Ratio: 3.8 Ratio
Triglycerides: 98 mg/dL (ref <150)
VLDL: 20 mg/dL (ref 0–40)

## 2012-07-01 NOTE — Telephone Encounter (Signed)
Labs ordered for 07-01-12

## 2012-07-02 ENCOUNTER — Ambulatory Visit (INDEPENDENT_AMBULATORY_CARE_PROVIDER_SITE_OTHER): Payer: BC Managed Care – PPO | Admitting: Family Medicine

## 2012-07-02 ENCOUNTER — Encounter: Payer: Self-pay | Admitting: Family Medicine

## 2012-07-02 VITALS — BP 100/72 | HR 61 | Temp 97.9°F | Ht 62.0 in | Wt 173.0 lb

## 2012-07-02 DIAGNOSIS — M25569 Pain in unspecified knee: Secondary | ICD-10-CM

## 2012-07-02 DIAGNOSIS — K219 Gastro-esophageal reflux disease without esophagitis: Secondary | ICD-10-CM

## 2012-07-02 DIAGNOSIS — IMO0001 Reserved for inherently not codable concepts without codable children: Secondary | ICD-10-CM

## 2012-07-02 DIAGNOSIS — E785 Hyperlipidemia, unspecified: Secondary | ICD-10-CM

## 2012-07-02 DIAGNOSIS — M549 Dorsalgia, unspecified: Secondary | ICD-10-CM

## 2012-07-02 MED ORDER — RANITIDINE HCL 300 MG PO TABS: 300.0000 mg | ORAL_TABLET | Freq: Every day | ORAL | Status: DC

## 2012-07-02 MED ORDER — CYCLOBENZAPRINE HCL 10 MG PO TABS: 10.0000 mg | ORAL_TABLET | Freq: Three times a day (TID) | ORAL | Status: DC | PRN

## 2012-07-02 NOTE — Patient Instructions (Addendum)
Labs prior to next visit. Hgba1c, tsh, renal, cbc Start a probiotic such as Digestive Advantage probiotic by [employer] Call when you are ready to have the Breath Test for the H Pylori   Gastroesophageal Reflux Disease, Adult Gastroesophageal reflux disease (GERD) happens when acid from your stomach flows up into the esophagus. When acid comes in contact with the esophagus, the acid causes soreness (inflammation) in the esophagus. Over time, GERD may create small holes (ulcers) in the lining of the esophagus. CAUSES   Increased body weight. This puts pressure on the stomach, making acid rise from the stomach into the esophagus.  Smoking. This increases acid production in the stomach.  Drinking alcohol. This causes decreased pressure in the lower esophageal sphincter (valve or ring of muscle between the esophagus and stomach), allowing acid from the stomach into the esophagus.  Late evening meals and a full stomach. This increases pressure and acid production in the stomach.  A malformed lower esophageal sphincter. Sometimes, no cause is found. SYMPTOMS   Burning pain in the lower part of the mid-chest behind the breastbone and in the mid-stomach area. This may occur twice a week or more often.  Trouble swallowing.  Sore throat.  Dry cough.  Asthma-like symptoms including chest tightness, shortness of breath, or wheezing. DIAGNOSIS  Your caregiver may be able to diagnose GERD based on your symptoms. In some cases, X-rays and other tests may be done to check for complications or to check the condition of your stomach and esophagus. TREATMENT  Your caregiver may recommend over-the-counter or prescription medicines to help decrease acid production. Ask your caregiver before starting or adding any new medicines.  HOME CARE INSTRUCTIONS   Change the factors that you can control. Ask your caregiver for guidance concerning weight loss, quitting smoking, and alcohol consumption.  Avoid foods  and drinks that make your symptoms worse, such as:  Caffeine or alcoholic drinks.  Chocolate.  Peppermint or mint flavorings.  Garlic and onions.  Spicy foods.  Citrus fruits, such as oranges, lemons, or limes.  Tomato-based foods such as sauce, chili, salsa, and pizza.  Fried and fatty foods.  Avoid lying down for the 3 hours prior to your bedtime or prior to taking a nap.  Eat small, frequent meals instead of large meals.  Wear loose-fitting clothing. Do not wear anything tight around your waist that causes pressure on your stomach.  Raise the head of your bed 6 to 8 inches with wood blocks to help you sleep. Extra pillows will not help.  Only take over-the-counter or prescription medicines for pain, discomfort, or fever as directed by your caregiver.  Do not take aspirin, ibuprofen, or other nonsteroidal anti-inflammatory drugs (NSAIDs). SEEK IMMEDIATE MEDICAL CARE IF:   You have pain in your arms, neck, jaw, teeth, or back.  Your pain increases or changes in intensity or duration.  You develop nausea, vomiting, or sweating (diaphoresis).  You develop shortness of breath, or you faint.  Your vomit is green, yellow, black, or looks like coffee grounds or blood.  Your stool is red, bloody, or black. These symptoms could be signs of other problems, such as heart disease, gastric bleeding, or esophageal bleeding. MAKE SURE YOU:   Understand these instructions.  Will watch your condition.  Will get help right away if you are not doing well or get worse. Document Released: 02/22/2005 Document Revised: 08/07/2011 Document Reviewed: 12/02/2010 [facility] Patient Information 2013 [place], [place].

## 2012-07-06 ENCOUNTER — Encounter: Payer: Self-pay | Admitting: Family Medicine

## 2012-07-06 NOTE — Assessment & Plan Note (Signed)
Tolerating Crestor, avoid simple carbs, consider switching from krill to fish oil

## 2012-07-06 NOTE — Progress Notes (Signed)
Patient ID: Margaret [name], female   DOB: [birth year], 62 y.o.   MRN: [id number] Lissete Maestas [id number] 1950/11/27 07/06/2012      Progress Note-Follow Up  Subjective  Chief Complaint  Chief Complaint  Patient presents with  . Follow-up    3 month    HPI  Patient is a 62 year old female in today for followup. She's been struggling with some intermittent low back pain as well as some chest wall pain worse with movement off and on for several months. Has been struggling also with worsening heartburn. If symptoms are worse upon lying down. Nexium is helpful but omeprazole was not. No other recent illness. No fevers or chills. No chest pain, headaches, palpitations, shortness of breath, GU complaints  Past Medical History  Diagnosis Date  . Chest pain   . Hyperlipidemia   . Sleep apnea   . GERD (gastroesophageal reflux disease)   . Obesity   . At risk for colon cancer     Last colonoscopy in [place] 03/2009 found hyperplastic polyp only    Past Surgical History  Procedure Laterality Date  . Dilation and curettage of uterus      Family History  Problem Relation Age of Onset  . Stroke Mother   . Lung cancer Father     History   Social History  . Marital Status: Married    Spouse Name: N/A    Number of Children: 2  . Years of Education: N/A   Occupational History  . Accountant    Social History Main Topics  . Smoking status: Never Smoker   . Smokeless tobacco: Never Used  . Alcohol Use: Yes  . Drug Use: No  . Sexually Active: Not on file   Other Topics Concern  . Not on file   Social History Narrative  . No narrative on file    Current Outpatient Prescriptions on File Prior to Visit  Medication Sig Dispense Refill  . calcium gluconate 500 MG tablet Take 1,000 mg by mouth daily.        . CRESTOR 10 MG tablet TAKE 1 TABLET BY MOUTH EVERY OTHER DAY  30 tablet  3  . fish oil-omega-3 fatty acids 1000 MG capsule Take 1 g by mouth daily.        . Multiple Vitamin  (MULTIVITAMIN PO) Take 1 tablet by mouth daily.         No current facility-administered medications on file prior to visit.    No Known Allergies  Review of Systems  Review of Systems  Constitutional: Negative for fever, chills and malaise/fatigue.  HENT: Negative for hearing loss, nosebleeds and congestion.   Eyes: Negative for discharge.  Respiratory: Negative for cough, sputum production, shortness of breath and wheezing.   Cardiovascular: Negative for chest pain, palpitations and leg swelling.  Gastrointestinal: Negative for heartburn, nausea, vomiting, abdominal pain, diarrhea, constipation and blood in stool.  Genitourinary: Negative for dysuria, urgency, frequency and hematuria.  Musculoskeletal: Positive for back pain and joint pain. Negative for myalgias and falls.  Skin: Negative for rash.  Neurological: Negative for dizziness, tremors, sensory change, focal weakness, loss of consciousness, weakness and headaches.  Endo/Heme/Allergies: Negative for polydipsia. Does not bruise/bleed easily.  Psychiatric/Behavioral: Negative for depression and suicidal ideas. The patient is not nervous/anxious and does not have insomnia.     Objective  BP 100/72  Pulse 61  Temp(Src) 97.9 F (36.6 C) (Oral)  Ht 5\' 2"  (1.575 m)  Wt 173 lb 0.6 oz (78.49 kg)  BMI 31.64 kg/m2  SpO2 98%  Physical Exam  Physical Exam  Constitutional: She is oriented to person, place, and time and well-developed, well-nourished, and in no distress. No distress.  HENT:  Head: Normocephalic and atraumatic.  Eyes: Conjunctivae are normal.  Neck: Neck supple. No thyromegaly present.  Cardiovascular: Normal rate, regular rhythm and normal heart sounds.   No murmur heard. Pulmonary/Chest: Effort normal and breath sounds normal. She has no wheezes.  Abdominal: She exhibits no distension and no mass.  Musculoskeletal: She exhibits no edema.  Lymphadenopathy:    She has no cervical adenopathy.  Neurological:  She is alert and oriented to person, place, and time.  Skin: Skin is warm and dry. No rash noted. She is not diaphoretic.  Psychiatric: Memory, affect and judgment normal.    Lab Results  Component Value Date   TSH 0.993 06/01/2010   Lab Results  Component Value Date   WBC 5.9 05/16/2011   HGB 12.7 05/16/2011   HCT 39.7 05/16/2011   MCV 87.4 05/16/2011   PLT 245 05/16/2011   Lab Results  Component Value Date   CREATININE 0.68 03/05/2012   BUN 14 03/05/2012   NA 140 03/05/2012   K 4.4 03/05/2012   CL 103 03/05/2012   CO2 30 03/05/2012   Lab Results  Component Value Date   ALT 26 07/01/2012   AST 21 07/01/2012   ALKPHOS 80 07/01/2012   BILITOT 0.5 07/01/2012   Lab Results  Component Value Date   CHOL 165 07/01/2012   Lab Results  Component Value Date   HDL 44 07/01/2012   Lab Results  Component Value Date   LDLCALC 101* 07/01/2012   Lab Results  Component Value Date   TRIG 98 07/01/2012   Lab Results  Component Value Date   CHOLHDL 3.8 07/01/2012     Assessment & Plan  GERD Nexium works well, avoid offending foods.  HYPERLIPIDEMIA Tolerating Crestor, avoid simple carbs, consider switching from krill to fish oil  Knee pain Encouraged ice and aspercreme prn  Back pain May use Cyclobenzaprine qhs and tylenol during the day, encouraged moist heat and gentle streching

## 2012-07-06 NOTE — Assessment & Plan Note (Signed)
May use Cyclobenzaprine qhs and tylenol during the day, encouraged moist heat and gentle streching

## 2012-07-06 NOTE — Assessment & Plan Note (Signed)
Nexium works well, avoid offending foods.

## 2012-07-06 NOTE — Assessment & Plan Note (Signed)
Encouraged ice and aspercreme prn

## 2012-08-06 ENCOUNTER — Telehealth: Payer: Self-pay | Admitting: Family Medicine

## 2012-08-06 DIAGNOSIS — A048 Other specified bacterial intestinal infections: Secondary | ICD-10-CM

## 2012-08-06 NOTE — Telephone Encounter (Signed)
Pt informed that order is in and she states she will come in on 08-08-12

## 2012-08-06 NOTE — Telephone Encounter (Signed)
Patient called ,she is ready to have Breath Test for H pylori  See last office note

## 2012-08-13 ENCOUNTER — Telehealth: Payer: Self-pay

## 2012-08-13 LAB — H. PYLORI BREATH TEST

## 2012-08-13 NOTE — Telephone Encounter (Signed)
Margaret [name] with [facility] called stating that they can't run patients Hpylori test because it isn't enough?

## 2012-08-13 NOTE — Telephone Encounter (Signed)
So did they run the breath test or the blood test?

## 2012-08-13 NOTE — Telephone Encounter (Signed)
I informed Katrina at [facility] and she is contacting pt

## 2012-08-13 NOTE — Telephone Encounter (Signed)
Yes have her repeat it no charge

## 2012-08-13 NOTE — Telephone Encounter (Signed)
The breath test. Do we need Margaret [name] to call her to have her come back in with no charge?

## 2012-08-15 ENCOUNTER — Other Ambulatory Visit: Payer: Self-pay | Admitting: Family Medicine

## 2012-08-16 LAB — H. PYLORI BREATH TEST: H. pylori Breath Test: POSITIVE — AB

## 2012-08-21 ENCOUNTER — Emergency Department (HOSPITAL_BASED_OUTPATIENT_CLINIC_OR_DEPARTMENT_OTHER)
Admission: EM | Admit: 2012-08-21 | Discharge: 2012-08-21 | Disposition: A | Discharge status: Home / Self Care | Payer: BC Managed Care – PPO | Attending: Emergency Medicine | Admitting: Emergency Medicine

## 2012-08-21 ENCOUNTER — Encounter (HOSPITAL_BASED_OUTPATIENT_CLINIC_OR_DEPARTMENT_OTHER): Payer: Self-pay | Admitting: Emergency Medicine

## 2012-08-21 DIAGNOSIS — Z860100 Personal history of colon polyps, unspecified: Secondary | ICD-10-CM | POA: Insufficient documentation

## 2012-08-21 DIAGNOSIS — K625 Hemorrhage of anus and rectum: Secondary | ICD-10-CM | POA: Insufficient documentation

## 2012-08-21 DIAGNOSIS — Z79899 Other long term (current) drug therapy: Secondary | ICD-10-CM | POA: Insufficient documentation

## 2012-08-21 DIAGNOSIS — E669 Obesity, unspecified: Secondary | ICD-10-CM | POA: Insufficient documentation

## 2012-08-21 DIAGNOSIS — E785 Hyperlipidemia, unspecified: Secondary | ICD-10-CM | POA: Insufficient documentation

## 2012-08-21 DIAGNOSIS — K219 Gastro-esophageal reflux disease without esophagitis: Secondary | ICD-10-CM | POA: Insufficient documentation

## 2012-08-21 DIAGNOSIS — R42 Dizziness and giddiness: Secondary | ICD-10-CM | POA: Insufficient documentation

## 2012-08-21 LAB — COMPREHENSIVE METABOLIC PANEL
AST: 17 U/L (ref 0–37)
Albumin: 4.1 g/dL (ref 3.5–5.2)
Alkaline Phosphatase: 89 U/L (ref 39–117)
Chloride: 104 mEq/L (ref 96–112)
Creatinine, Ser: 0.7 mg/dL (ref 0.50–1.10)
Potassium: 3.8 mEq/L (ref 3.5–5.1)
Sodium: 141 mEq/L (ref 135–145)
Total Bilirubin: 0.2 mg/dL — ABNORMAL LOW (ref 0.3–1.2)

## 2012-08-21 LAB — CBC WITH DIFFERENTIAL/PLATELET
Basophils Absolute: 0 10*3/uL (ref 0.0–0.1)
Basophils Relative: 1 % (ref 0–1)
MCHC: 33.6 g/dL (ref 30.0–36.0)
Neutro Abs: 2.8 10*3/uL (ref 1.7–7.7)
Neutrophils Relative %: 43 % (ref 43–77)
Platelets: 238 10*3/uL (ref 150–400)
RDW: 13.3 % (ref 11.5–15.5)

## 2012-08-21 LAB — OCCULT BLOOD X 1 CARD TO LAB, STOOL: Fecal Occult Bld: NEGATIVE

## 2012-08-21 MED ORDER — SODIUM CHLORIDE 0.9 % IV BOLUS (SEPSIS)
1000.0000 mL | Freq: Once | INTRAVENOUS | Status: AC
  Administered 2012-08-21: 1000 mL via INTRAVENOUS

## 2012-08-21 MED ORDER — PANTOPRAZOLE SODIUM 40 MG IV SOLR
40.0000 mg | Freq: Once | INTRAVENOUS | Status: AC
  Administered 2012-08-21: 40 mg via INTRAVENOUS
  Filled 2012-08-21: qty 40

## 2012-08-21 NOTE — ED Notes (Signed)
Pt denies diarrhea and hard stool.

## 2012-08-21 NOTE — ED Provider Notes (Signed)
History     CSN: [id number]  Arrival date & time 08/21/12  0132   First MD Initiated Contact with Patient 08/21/12 0145      Chief Complaint  Patient presents with  . Rectal Bleeding    (Consider location/radiation/quality/duration/timing/severity/associated sxs/prior treatment) HPI Comments: Patient presents with rectal bleeding. She states that she had 3 stools today and the last one which occurred about an hour prior to arrival to the emergency room containing blood. She said it was a formed stool but had blood mixed in the toilet water." When she wiped. She states prior to that bowel movement she had a sharp crampy pain in her abdomen that when the way with a bowel movement. She currently denies any abdominal pain. She denies any nausea or vomiting. She does have ongoing problems with gastroesophageal reflux disease and states that she does feel like her reflux is bothering her worse over the last few days. She denies any black stools. She feels a little bit lightheaded tonight but hasn't had any previous problems with dizziness or lightheadedness. She denies any chest pain or shortness of breath. She denies any history of GI bleeds in the past.  Patient is a 62 y.o. female presenting with hematochezia.  Rectal Bleeding  Pertinent negatives include no fever, no abdominal pain, no diarrhea, no nausea, no vomiting, no hematuria, no chest pain, no headaches, no coughing and no rash.    Past Medical History  Diagnosis Date  . Chest pain   . Hyperlipidemia   . Sleep apnea   . GERD (gastroesophageal reflux disease)   . Obesity   . At risk for colon cancer     Last colonoscopy in [place] 03/2009 found hyperplastic polyp only    Past Surgical History  Procedure Laterality Date  . Dilation and curettage of uterus      Family History  Problem Relation Age of Onset  . Stroke Mother   . Lung cancer Father     History  Substance Use Topics  . Smoking status: Never Smoker   .  Smokeless tobacco: Never Used  . Alcohol Use: Yes    OB History   Grav Para Term Preterm Abortions TAB SAB Ect Mult Living                  Review of Systems  Constitutional: Negative for fever, chills, diaphoresis and fatigue.  HENT: Negative for congestion, rhinorrhea and sneezing.   Eyes: Negative.   Respiratory: Negative for cough, chest tightness and shortness of breath.   Cardiovascular: Negative for chest pain and leg swelling.  Gastrointestinal: Positive for blood in stool and hematochezia. Negative for nausea, vomiting, abdominal pain and diarrhea.  Genitourinary: Negative for frequency, hematuria, flank pain and difficulty urinating.  Musculoskeletal: Negative for back pain and arthralgias.  Skin: Negative for rash.  Neurological: Positive for light-headedness. Negative for dizziness, speech difficulty, weakness, numbness and headaches.    Allergies  Review of patient's allergies indicates no known allergies.  Home Medications   Current Outpatient Rx  Name  Route  Sig  Dispense  Refill  . CRESTOR 10 MG tablet      TAKE 1 TABLET BY MOUTH EVERY OTHER DAY   30 tablet   3   . fish oil-omega-3 fatty acids 1000 MG capsule   Oral   Take 1 g by mouth daily.           . Multiple Vitamin (MULTIVITAMIN PO)   Oral   Take 1 tablet  by mouth daily.           . calcium gluconate 500 MG tablet   Oral   Take 1,000 mg by mouth daily.           . cyclobenzaprine (FLEXERIL) 10 MG tablet   Oral   Take 1 tablet (10 mg total) by mouth 3 (three) times daily as needed for muscle spasms.   30 tablet   1   . ranitidine (ZANTAC) 300 MG tablet   Oral   Take 1 tablet (300 mg total) by mouth at bedtime.   30 tablet   3     BP 166/72  Pulse 60  Temp(Src) 98.5 F (36.9 C) (Oral)  Resp 18  Ht 5\' 2"  (1.575 m)  Wt 165 lb (74.844 kg)  BMI 30.17 kg/m2  SpO2 99%  Physical Exam  Constitutional: She is oriented to person, place, and time. She appears well-developed and  well-nourished.  HENT:  Head: Normocephalic and atraumatic.  Eyes: Pupils are equal, round, and reactive to light.  Neck: Normal range of motion. Neck supple.  Cardiovascular: Normal rate, regular rhythm and normal heart sounds.   Pulmonary/Chest: Effort normal and breath sounds normal. No respiratory distress. She has no wheezes. She has no rales. She exhibits no tenderness.  Abdominal: Soft. Bowel sounds are normal. There is no tenderness. There is no rebound and no guarding.  Genitourinary:  Rectal exam showed some nonthrombosed external hemorrhoids. There is no tenderness in the perirectal area. There is stool mixed with some gross blood on rectal exam.  Musculoskeletal: Normal range of motion. She exhibits no edema.  Lymphadenopathy:    She has no cervical adenopathy.  Neurological: She is alert and oriented to person, place, and time.  Skin: Skin is warm and dry. No rash noted.  Psychiatric: She has a normal mood and affect.    ED Course  Procedures (including critical care time)  Results for orders placed during the hospital encounter of 08/21/12  OCCULT BLOOD X 1 CARD TO LAB, STOOL      Result Value Range   Fecal Occult Bld NEGATIVE  NEGATIVE  CBC WITH DIFFERENTIAL      Result Value Range   WBC 6.5  4.0 - 10.5 K/uL   RBC 4.50  3.87 - 5.11 MIL/uL   Hemoglobin 12.9  12.0 - 15.0 g/dL   HCT 16.1  09.6 - 04.5 %   MCV 85.3  78.0 - 100.0 fL   MCH 28.7  26.0 - 34.0 pg   MCHC 33.6  30.0 - 36.0 g/dL   RDW 40.9  81.1 - 91.4 %   Platelets 238  150 - 400 K/uL   Neutrophils Relative 43  43 - 77 %   Neutro Abs 2.8  1.7 - 7.7 K/uL   Lymphocytes Relative 44  12 - 46 %   Lymphs Abs 2.8  0.7 - 4.0 K/uL   Monocytes Relative 8  3 - 12 %   Monocytes Absolute 0.5  0.1 - 1.0 K/uL   Eosinophils Relative 5  0 - 5 %   Eosinophils Absolute 0.3  0.0 - 0.7 K/uL   Basophils Relative 1  0 - 1 %   Basophils Absolute 0.0  0.0 - 0.1 K/uL  COMPREHENSIVE METABOLIC PANEL      Result Value Range    Sodium 141  135 - 145 mEq/L   Potassium 3.8  3.5 - 5.1 mEq/L   Chloride 104  96 - 112 mEq/L  CO2 26  19 - 32 mEq/L   Glucose, Bld 113 (*) 70 - 99 mg/dL   BUN 12  6 - 23 mg/dL   Creatinine, Ser 1.19  0.50 - 1.10 mg/dL   Calcium 9.8  8.4 - 14.7 mg/dL   Total Protein 7.9  6.0 - 8.3 g/dL   Albumin 4.1  3.5 - 5.2 g/dL   AST 17  0 - 37 U/L   ALT 20  0 - 35 U/L   Alkaline Phosphatase 89  39 - 117 U/L   Total Bilirubin 0.2 (*) 0.3 - 1.2 mg/dL   GFR calc non Af Amer >90  >90 mL/min   GFR calc Af Amer >90  >90 mL/min   No results found.    1. Rectal bleeding       MDM  Patient had one episode of a bloody bowel movement prior to arrival. She's had no further episodes of rectal bleeding since then. There's been no rectal bleeding in the emergency department. She currently is asymptomatic. Her hemoglobin is normal. She has no abdominal pain. Her Hemoccult test was negative. She was discharged home in good condition I did advise her followup with her primary care physician tomorrow for recheck or return here if she has any further rectal bleeding.        Rolan Bucco, MD 08/21/12 [phone]

## 2012-08-21 NOTE — ED Notes (Signed)
Pt states she had blood in stool yesterday and has lower abd pain.

## 2012-08-22 MED ORDER — CLARITHROMYCIN 500 MG PO TABS: 500.0000 mg | ORAL_TABLET | Freq: Two times a day (BID) | ORAL | Status: DC

## 2012-08-22 MED ORDER — OMEPRAZOLE 20 MG PO CPDR: 20.0000 mg | DELAYED_RELEASE_CAPSULE | Freq: Two times a day (BID) | ORAL | Status: DC

## 2012-08-22 MED ORDER — AMOXICILLIN 500 MG PO CAPS: 500.0000 mg | ORAL_CAPSULE | Freq: Three times a day (TID) | ORAL | Status: DC

## 2012-08-22 NOTE — Progress Notes (Signed)
Quick Note:  Patient Informed and voiced understanding ______ 

## 2012-08-22 NOTE — Addendum Note (Signed)
Addended by: Court Joy on: 08/22/2012 04:20 PM   Modules accepted: Orders

## 2012-08-29 ENCOUNTER — Ambulatory Visit (INDEPENDENT_AMBULATORY_CARE_PROVIDER_SITE_OTHER): Payer: BC Managed Care – PPO | Admitting: Family Medicine

## 2012-08-29 ENCOUNTER — Encounter: Payer: Self-pay | Admitting: Family Medicine

## 2012-08-29 VITALS — BP 118/64 | HR 76 | Temp 98.1°F | Ht 62.0 in | Wt 169.0 lb

## 2012-08-29 DIAGNOSIS — E119 Type 2 diabetes mellitus without complications: Secondary | ICD-10-CM

## 2012-08-29 DIAGNOSIS — M899 Disorder of bone, unspecified: Secondary | ICD-10-CM

## 2012-08-29 DIAGNOSIS — M8580 Other specified disorders of bone density and structure, unspecified site: Secondary | ICD-10-CM

## 2012-08-29 DIAGNOSIS — R7309 Other abnormal glucose: Secondary | ICD-10-CM

## 2012-08-29 DIAGNOSIS — A048 Other specified bacterial intestinal infections: Secondary | ICD-10-CM

## 2012-08-29 DIAGNOSIS — E785 Hyperlipidemia, unspecified: Secondary | ICD-10-CM

## 2012-08-29 MED ORDER — AMOXICILLIN 500 MG PO TABS: 1000.0000 mg | ORAL_TABLET | Freq: Two times a day (BID) | ORAL | Status: DC

## 2012-08-29 MED ORDER — ROSUVASTATIN CALCIUM 10 MG PO TABS: 10.0000 mg | ORAL_TABLET | Freq: Every day | ORAL | Status: DC

## 2012-08-29 MED ORDER — PROBIOTIC PO CAPS: ORAL_CAPSULE | ORAL | Status: DC

## 2012-08-29 NOTE — Patient Instructions (Addendum)
  Labs prior to visit to include lipid, renal, hepatic, tsh, cbc, hgba1c  Helicobacter Pylori Disease Often patients with stomach or duodenal ulcers not caused by irritants, are infected with a germ. The germ is called helicobacter pylori (H. pylori). This bacterium lives on the surface of stomach and small bowel. It can cause redness, soreness and ulcers. Ulcers are a hole in the lining of your stomach or small bowel. Blood and special breath tests can detect if you are infected with H. pylori. Tests can be done on samples taken from the stomach if you have endoscopy. After treatment you may have tests to prove you are cured. These can be done about a month after you finish the treatment or as your caregiver suggests. Most infections can be cured with a combination of antibiotics. Antibiotics are medications which kill germs such as H. pylori. Anti-ulcer medicines which block stomach acid secretion may also be used. Treatment will be continued for the time your caregiver suggests. Call your caregiver if you need more information about H. pylori. Call also if your symptoms get worse during or after treatment. You will not need a special diet. Avoid:  Smoking.  Aspirin.  Ibuprofen.  Other anti-inflammatory drugs. Alcohol and spicy foods may also make your symptoms worse. The best advice is to avoid anything you find upsetting to your stomach. SEEK IMMEDIATE MEDICAL CARE IF:  You develop sharp, sudden, lasting stomach pain.  You have bloody vomit or vomit that looks like coffee grounds.  You have bloody or black stools.  You develop a lightheaded feeling, fainting, or become weak and sweaty. Document Released: 05/15/2005 Document Revised: 08/07/2011 Document Reviewed: 10/31/2006 [employer] Patient Information 2013  Lipoma A lipoma is a noncancerous (benign) tumor composed of fat cells. They are usually found under the skin (subcutaneous). A lipoma may occur in any tissue of the body that  contains fat. Common areas for lipomas to appear include the back, shoulders, buttocks, and thighs. Lipomas are a very common soft tissue growth. They are soft and grow slowly. Most problems caused by a lipoma depend on where it is growing. DIAGNOSIS  A lipoma can be diagnosed with a physical exam. These tumors rarely become cancerous, but radiographic studies can help determine this for certain. Studies used may include:  Computerized X-ray scans (CT or CAT scan).  Computerized magnetic scans (MRI). TREATMENT  Small lipomas that are not causing problems may be watched. If a lipoma continues to enlarge or causes problems, removal is often the best treatment. Lipomas can also be removed to improve appearance. Surgery is done to remove the fatty cells and the surrounding capsule. Most often, this is done with medicine that numbs the area (local anesthetic). The removed tissue is examined under a microscope to make sure it is not cancerous. Keep all follow-up appointments with your caregiver. SEEK MEDICAL CARE IF:   The lipoma becomes larger or hard.  The lipoma becomes painful, red, or increasingly swollen. These could be signs of infection or a more serious condition. Document Released: 05/05/2002 Document Revised: 08/07/2011 Document Reviewed: 10/15/2009 [facility] Patient Information 2013 [place], [place].

## 2012-08-30 LAB — VITAMIN D 25 HYDROXY (VIT D DEFICIENCY, FRACTURES): Vit D, 25-Hydroxy: 15 ng/mL — ABNORMAL LOW (ref 30–89)

## 2012-08-31 ENCOUNTER — Encounter: Payer: Self-pay | Admitting: Family Medicine

## 2012-08-31 DIAGNOSIS — M8580 Other specified disorders of bone density and structure, unspecified site: Secondary | ICD-10-CM | POA: Insufficient documentation

## 2012-08-31 HISTORY — DX: Other specified disorders of bone density and structure, unspecified site: M85.80

## 2012-08-31 NOTE — Assessment & Plan Note (Signed)
Tolerating treatment at this time, is encouraged to start a probiotic and finish course of treatment

## 2012-08-31 NOTE — Assessment & Plan Note (Signed)
Minimize simple carbs 

## 2012-08-31 NOTE — Progress Notes (Signed)
Patient ID: Margaret [name], female   DOB: [birth year], 62 y.o.   MRN: [id number] Margaret [name] [id number] March 18, 1951 08/31/2012      Progress Note-Follow Up  Subjective  Chief Complaint  Chief Complaint  Patient presents with  . Follow-up    HPI  Patient is a 62 year old female who is in today for followup. We recently diagnosed her with H. Pylori and she is doing her antibiotics at the present time. She noticed there's been some increased dyspepsia with treatment but no bloody or tarry stool. No fevers or chills. She is able to get the medications in. No other acute complaints. No fevers no chest pain no palpitations. She is trying to maintain a heart healthy diet and avoid offending foods. She continues to struggle with some back and joint pain but finds it tolerable. No polyuria or polydipsia.  Past Medical History  Diagnosis Date  . Chest pain   . Hyperlipidemia   . Sleep apnea   . GERD (gastroesophageal reflux disease)   . Obesity   . At risk for colon cancer     Last colonoscopy in [place] 03/2009 found hyperplastic polyp only  . Osteopenia 08/31/2012    Past Surgical History  Procedure Laterality Date  . Dilation and curettage of uterus      Family History  Problem Relation Age of Onset  . Stroke Mother   . Lung cancer Father     History   Social History  . Marital Status: Married    Spouse Name: N/A    Number of Children: 2  . Years of Education: N/A   Occupational History  . Accountant    Social History Main Topics  . Smoking status: Never Smoker   . Smokeless tobacco: Never Used  . Alcohol Use: Yes  . Drug Use: No  . Sexually Active: Not on file   Other Topics Concern  . Not on file   Social History Narrative  . No narrative on file    Current Outpatient Prescriptions on File Prior to Visit  Medication Sig Dispense Refill  . calcium gluconate 500 MG tablet Take 1,000 mg by mouth daily.        . clarithromycin (BIAXIN) 500 MG tablet Take 1 tablet  (500 mg total) by mouth 2 (two) times daily. X 10 days  20 tablet  0  . cyclobenzaprine (FLEXERIL) 10 MG tablet Take 1 tablet (10 mg total) by mouth 3 (three) times daily as needed for muscle spasms.  30 tablet  1  . fish oil-omega-3 fatty acids 1000 MG capsule Take 1 g by mouth daily.        . Multiple Vitamin (MULTIVITAMIN PO) Take 1 tablet by mouth daily.        Marland Kitchen omeprazole (PRILOSEC) 20 MG capsule Take 1 capsule (20 mg total) by mouth 2 (two) times daily. X 10 days. Then 1 tab daily X 20 days  40 capsule  0  . ranitidine (ZANTAC) 300 MG tablet Take 1 tablet (300 mg total) by mouth at bedtime.  30 tablet  3   No current facility-administered medications on file prior to visit.    No Known Allergies  Review of Systems  Review of Systems  Constitutional: Negative for fever and malaise/fatigue.  HENT: Negative for congestion.   Eyes: Negative for discharge.  Respiratory: Negative for shortness of breath.   Cardiovascular: Negative for chest pain, palpitations and leg swelling.  Gastrointestinal: Positive for heartburn. Negative for nausea, abdominal pain and diarrhea.  Genitourinary: Negative for dysuria.  Musculoskeletal: Negative for falls.  Skin: Negative for rash.  Neurological: Negative for loss of consciousness and headaches.  Endo/Heme/Allergies: Negative for polydipsia.  Psychiatric/Behavioral: Negative for depression and suicidal ideas. The patient is not nervous/anxious and does not have insomnia.     Objective  BP 118/64  Pulse 76  Temp(Src) 98.1 F (36.7 C) (Oral)  Ht 5\' 2"  (1.575 m)  Wt 169 lb 0.6 oz (76.676 kg)  BMI 30.91 kg/m2  SpO2 97%  Physical Exam  Physical Exam  Constitutional: She is oriented to person, place, and time and well-developed, well-nourished, and in no distress. No distress.  HENT:  Head: Normocephalic and atraumatic.  Eyes: Conjunctivae are normal.  Neck: Neck supple. No thyromegaly present.  Cardiovascular: Normal rate, regular  rhythm and normal heart sounds.   No murmur heard. Pulmonary/Chest: Effort normal and breath sounds normal. She has no wheezes.  Abdominal: She exhibits no distension and no mass.  Musculoskeletal: She exhibits no edema.  Lymphadenopathy:    She has no cervical adenopathy.  Neurological: She is alert and oriented to person, place, and time.  Skin: Skin is warm and dry. No rash noted. She is not diaphoretic.  Psychiatric: Memory, affect and judgment normal.    Lab Results  Component Value Date   TSH 0.993 06/01/2010   Lab Results  Component Value Date   WBC 6.5 08/21/2012   HGB 12.9 08/21/2012   HCT 38.4 08/21/2012   MCV 85.3 08/21/2012   PLT 238 08/21/2012   Lab Results  Component Value Date   CREATININE 0.70 08/21/2012   BUN 12 08/21/2012   NA 141 08/21/2012   K 3.8 08/21/2012   CL 104 08/21/2012   CO2 26 08/21/2012   Lab Results  Component Value Date   ALT 20 08/21/2012   AST 17 08/21/2012   ALKPHOS 89 08/21/2012   BILITOT 0.2* 08/21/2012   Lab Results  Component Value Date   CHOL 165 07/01/2012   Lab Results  Component Value Date   HDL 44 07/01/2012   Lab Results  Component Value Date   LDLCALC 101* 07/01/2012   Lab Results  Component Value Date   TRIG 98 07/01/2012   Lab Results  Component Value Date   CHOLHDL 3.8 07/01/2012     Assessment & Plan  HELICOBACTER PYLORI INFECTION Tolerating treatment at this time, is encouraged to start a probiotic and finish course of treatment  Glucose intolerance Minimize simple carbs.   HYPERLIPIDEMIA Avoid trans fats, tolerating Crestor, minimize saturated fats and simple carbs

## 2012-08-31 NOTE — Assessment & Plan Note (Signed)
Avoid trans fats, tolerating Crestor, minimize saturated fats and simple carbs

## 2012-09-02 ENCOUNTER — Telehealth: Payer: Self-pay | Admitting: *Deleted

## 2012-09-02 DIAGNOSIS — E559 Vitamin D deficiency, unspecified: Secondary | ICD-10-CM

## 2012-09-02 MED ORDER — ERGOCALCIFEROL 1.25 MG (50000 UT) PO CAPS: 50000.0000 [IU] | ORAL_CAPSULE | ORAL | Status: DC

## 2012-09-02 NOTE — Telephone Encounter (Signed)
Pt request call to [facility] to restart/consultation for CPAP Unit; Rx order for supplies/unit needed, along with copy of sleep study. Provider informed; we need to know if patient was given unit post 2012 sleep study, Insurance will not cover if it has been w/i last [5] yrs, per Dr. Abner Greenspan. Called [facility]ia again who states pt was given supplies, but no unit; called pt to verify if she had recvd unit outside of [place], [facility] with contact name and number for return call and further provider information/SLS  Patient inquired about lab results--Notes Recorded by Bradd Canary, MD on 08/30/2012 at 8:11 AM Notify sugar looks good hgba1c is 6.1 but vitamin D is low at 15 rec Vitamin D 50000IU tab 1 weekly x 12 weeks then recheck level. Disp #4 tabs with 4 rf

## 2012-09-06 NOTE — Telephone Encounter (Signed)
Physician Order with last Sleep Study faxed to [facility] [[phone]]/UJW

## 2012-10-25 ENCOUNTER — Other Ambulatory Visit: Payer: Self-pay | Admitting: Family Medicine

## 2012-10-28 NOTE — Telephone Encounter (Signed)
Omeprazole once daily for now, disp #30, 5 rf or #90 with 1 rf

## 2012-10-28 NOTE — Telephone Encounter (Signed)
Please advise? Does this patient take omeprazole x1 daily?

## 2012-11-14 ENCOUNTER — Encounter: Payer: Self-pay | Admitting: Family Medicine

## 2012-11-14 ENCOUNTER — Ambulatory Visit (INDEPENDENT_AMBULATORY_CARE_PROVIDER_SITE_OTHER): Payer: BC Managed Care – PPO | Admitting: Family Medicine

## 2012-11-14 ENCOUNTER — Telehealth: Payer: Self-pay | Admitting: Family Medicine

## 2012-11-14 ENCOUNTER — Ambulatory Visit (HOSPITAL_BASED_OUTPATIENT_CLINIC_OR_DEPARTMENT_OTHER)
Admission: RE | Admit: 2012-11-14 | Discharge: 2012-11-14 | Disposition: A | Discharge status: Home / Self Care | Payer: BC Managed Care – PPO | Source: Ambulatory Visit | Attending: Family Medicine | Admitting: Family Medicine

## 2012-11-14 VITALS — BP 126/72 | HR 70 | Temp 97.7°F | Ht 62.0 in | Wt 169.1 lb

## 2012-11-14 DIAGNOSIS — M25519 Pain in unspecified shoulder: Secondary | ICD-10-CM

## 2012-11-14 DIAGNOSIS — R739 Hyperglycemia, unspecified: Secondary | ICD-10-CM

## 2012-11-14 DIAGNOSIS — M25512 Pain in left shoulder: Secondary | ICD-10-CM

## 2012-11-14 DIAGNOSIS — M549 Dorsalgia, unspecified: Secondary | ICD-10-CM

## 2012-11-14 DIAGNOSIS — R52 Pain, unspecified: Secondary | ICD-10-CM

## 2012-11-14 DIAGNOSIS — E785 Hyperlipidemia, unspecified: Secondary | ICD-10-CM

## 2012-11-14 DIAGNOSIS — E559 Vitamin D deficiency, unspecified: Secondary | ICD-10-CM

## 2012-11-14 MED ORDER — TRAMADOL HCL 50 MG PO TABS: 50.0000 mg | ORAL_TABLET | Freq: Three times a day (TID) | ORAL | Status: DC | PRN

## 2012-11-14 MED ORDER — METHOCARBAMOL 500 MG PO TABS: 500.0000 mg | ORAL_TABLET | Freq: Three times a day (TID) | ORAL | Status: DC | PRN

## 2012-11-14 NOTE — Progress Notes (Signed)
Patient ID: Margaret [name], female   DOB: [birth year], 63 y.o.   MRN: [id number] Margaret [name] [id number] 08-12-1950 11/14/2012      Progress Note-Follow Up  Subjective  Chief Complaint  Chief Complaint  Patient presents with  . inflammation in left arm    HPI  Patient is a 62 year old female in today to discuss shoulder pain. Is bothering her for 1-2 weeks. She technologist she's been exercising more but does not remember moment where she injured it. Pain is present in the anterior and posterior shoulder. No significant change with movement. No radicular symptoms. No warmth or redness. No other acute complaints low back pain is actually somewhat improved and she did not proceed with physical therapy. No other chest pain, palpitations, shortness of, fevers, GI or GU complaints are noted  Past Medical History  Diagnosis Date  . Chest pain   . Hyperlipidemia   . Sleep apnea   . GERD (gastroesophageal reflux disease)   . Obesity   . At risk for colon cancer     Last colonoscopy in [place] 03/2009 found hyperplastic polyp only  . Osteopenia 08/31/2012  . Left shoulder pain 11/14/2012    Past Surgical History  Procedure Laterality Date  . Dilation and curettage of uterus      Family History  Problem Relation Age of Onset  . Stroke Mother   . Lung cancer Father     History   Social History  . Marital Status: Married    Spouse Name: N/A    Number of Children: 2  . Years of Education: N/A   Occupational History  . Accountant    Social History Main Topics  . Smoking status: Never Smoker   . Smokeless tobacco: Never Used  . Alcohol Use: Yes  . Drug Use: No  . Sexually Active: Not on file   Other Topics Concern  . Not on file   Social History Narrative  . No narrative on file    Current Outpatient Prescriptions on File Prior to Visit  Medication Sig Dispense Refill  . calcium gluconate 500 MG tablet Take 1,000 mg by mouth daily.        . ergocalciferol (VITAMIN D2)  50000 UNITS capsule Take 1 capsule (50,000 Units total) by mouth once a week.  4 capsule  3  . fish oil-omega-3 fatty acids 1000 MG capsule Take 1 g by mouth daily.        . Multiple Vitamin (MULTIVITAMIN PO) Take 1 tablet by mouth daily.        Marland Kitchen PROBIOTIC CAPS Digestive Advantage probiotic daily or a generic      . rosuvastatin (CRESTOR) 10 MG tablet Take 1 tablet (10 mg total) by mouth daily.  30 tablet  3   No current facility-administered medications on file prior to visit.    No Known Allergies  Review of Systems  Review of Systems  Constitutional: Negative for fever and malaise/fatigue.  HENT: Negative for congestion.   Eyes: Negative for discharge.  Respiratory: Negative for shortness of breath.   Cardiovascular: Negative for chest pain, palpitations and leg swelling.  Gastrointestinal: Negative for nausea, abdominal pain and diarrhea.  Genitourinary: Negative for dysuria.  Musculoskeletal: Negative for falls.  Skin: Negative for rash.  Neurological: Negative for loss of consciousness and headaches.  Endo/Heme/Allergies: Negative for polydipsia.  Psychiatric/Behavioral: Negative for depression and suicidal ideas. The patient is not nervous/anxious and does not have insomnia.     Objective  BP 126/72  Pulse 70  Temp(Src) 97.7 F (36.5 C) (Oral)  Ht 5\' 2"  (1.575 m)  Wt 169 lb 1.3 oz (76.694 kg)  BMI 30.92 kg/m2  SpO2 94%  Physical Exam  Physical Exam  Constitutional: She is oriented to person, place, and time and well-developed, well-nourished, and in no distress. No distress.  HENT:  Head: Normocephalic and atraumatic.  Eyes: Conjunctivae are normal.  Neck: Neck supple. No thyromegaly present.  Cardiovascular: Normal rate, regular rhythm and normal heart sounds.   No murmur heard. Pulmonary/Chest: Effort normal and breath sounds normal. She has no wheezes.  Abdominal: She exhibits no distension and no mass.  Musculoskeletal: She exhibits no edema.  Muscle  spasm noted over left SCM and over anterior chest wall and over scapula  Lymphadenopathy:    She has no cervical adenopathy.  Neurological: She is alert and oriented to person, place, and time.  Skin: Skin is warm and dry. No rash noted. She is not diaphoretic.  Psychiatric: Memory, affect and judgment normal.    Lab Results  Component Value Date   TSH 0.993 06/01/2010   Lab Results  Component Value Date   WBC 6.5 08/21/2012   HGB 12.9 08/21/2012   HCT 38.4 08/21/2012   MCV 85.3 08/21/2012   PLT 238 08/21/2012   Lab Results  Component Value Date   CREATININE 0.70 08/21/2012   BUN 12 08/21/2012   NA 141 08/21/2012   K 3.8 08/21/2012   CL 104 08/21/2012   CO2 26 08/21/2012   Lab Results  Component Value Date   ALT 20 08/21/2012   AST 17 08/21/2012   ALKPHOS 89 08/21/2012   BILITOT 0.2* 08/21/2012   Lab Results  Component Value Date   CHOL 165 07/01/2012   Lab Results  Component Value Date   HDL 44 07/01/2012   Lab Results  Component Value Date   LDLCALC 101* 07/01/2012   Lab Results  Component Value Date   TRIG 98 07/01/2012   Lab Results  Component Value Date   CHOLHDL 3.8 07/01/2012     Assessment & Plan  Left shoulder pain Has been exercising more recently, has strained her left shoulder. Try Salon Pas topically, given muscle relaxer to use bid as tolerated and Tramadol for severe pain. Refer to ortho if no improvement.   Back pain Somewhat improved and chose not to proceed with PT

## 2012-11-14 NOTE — Telephone Encounter (Signed)
Needs labs prior to next visit in 4-6 weeks, lipid, renal, hgba1c, vitamin d, hepatic, cbc for hyperglycemia, pain, hyperlipid and vit d def    Patient has appointment on 12/26/12 and will be going to [facility] lab

## 2012-11-14 NOTE — Assessment & Plan Note (Signed)
Has been exercising more recently, has strained her left shoulder. Try Salon Pas topically, given muscle relaxer to use bid as tolerated and Tramadol for severe pain. Refer to ortho if no improvement.

## 2012-11-14 NOTE — Assessment & Plan Note (Signed)
Somewhat improved and chose not to proceed with PT

## 2012-11-14 NOTE — Patient Instructions (Addendum)
Needs labs prior to next visit in 4-6 weeks, lipid, renal, hgba1c, vitamin d, hepatic, cbc for hyperglycemia, pain, hyperlipid and vit d def   Bursitis Bursitis is a swelling and soreness (inflammation) of a fluid-filled sac (bursa) that overlies and protects a joint. It can be caused by injury, overuse of the joint, arthritis or infection. The joints most likely to be affected are the elbows, shoulders, hips and knees. HOME CARE INSTRUCTIONS   Apply ice to the affected area for 15-20 minutes each hour while awake for 2 days. Put the ice in a plastic bag and place a towel between the bag of ice and your skin.  Rest the injured joint as much as possible, but continue to put the joint through a full range of motion, 4 times per day. (The shoulder joint especially becomes rapidly "frozen" if not used.) When the pain lessens, begin normal slow movements and usual activities.  Only take over-the-counter or prescription medicines for pain, discomfort or fever as directed by your caregiver.  Your caregiver may recommend draining the bursa and injecting medicine into the bursa. This may help the healing process.  Follow all instructions for follow-up with your caregiver. This includes any orthopedic referrals, physical therapy and rehabilitation. Any delay in obtaining necessary care could result in a delay or failure of the bursitis to heal and chronic pain. SEEK IMMEDIATE MEDICAL CARE IF:   Your pain increases even during treatment.  You develop an oral temperature above 102 F (38.9 C) and have heat and inflammation over the involved bursa. MAKE SURE YOU:   Understand these instructions.  Will watch your condition.  Will get help right away if you are not doing well or get worse. Document Released: 05/12/2000 Document Revised: 08/07/2011 Document Reviewed: 04/16/2009 [facility] Patient Information 2014 [place], [place].

## 2012-12-26 ENCOUNTER — Ambulatory Visit: Payer: BC Managed Care – PPO | Admitting: Family Medicine

## 2012-12-26 ENCOUNTER — Ambulatory Visit (INDEPENDENT_AMBULATORY_CARE_PROVIDER_SITE_OTHER): Payer: BC Managed Care – PPO | Admitting: Family Medicine

## 2012-12-26 ENCOUNTER — Encounter: Payer: Self-pay | Admitting: Family Medicine

## 2012-12-26 VITALS — BP 140/84 | HR 73 | Temp 98.4°F | Ht 62.0 in | Wt 170.0 lb

## 2012-12-26 DIAGNOSIS — M25512 Pain in left shoulder: Secondary | ICD-10-CM

## 2012-12-26 DIAGNOSIS — M549 Dorsalgia, unspecified: Secondary | ICD-10-CM

## 2012-12-26 DIAGNOSIS — K219 Gastro-esophageal reflux disease without esophagitis: Secondary | ICD-10-CM

## 2012-12-26 DIAGNOSIS — E785 Hyperlipidemia, unspecified: Secondary | ICD-10-CM

## 2012-12-26 DIAGNOSIS — M25519 Pain in unspecified shoulder: Secondary | ICD-10-CM

## 2012-12-26 LAB — LIPID PANEL
Cholesterol: 191 mg/dL (ref 0–200)
LDL Cholesterol: 118 mg/dL — ABNORMAL HIGH (ref 0–99)
Total CHOL/HDL Ratio: 3.7 Ratio
VLDL: 22 mg/dL (ref 0–40)

## 2012-12-26 LAB — HEPATIC FUNCTION PANEL
AST: 18 U/L (ref 0–37)
Albumin: 4.4 g/dL (ref 3.5–5.2)
Alkaline Phosphatase: 70 U/L (ref 39–117)
Total Protein: 6.9 g/dL (ref 6.0–8.3)

## 2012-12-26 LAB — RENAL FUNCTION PANEL
BUN: 8 mg/dL (ref 6–23)
Calcium: 9.3 mg/dL (ref 8.4–10.5)
Chloride: 105 mEq/L (ref 96–112)
Creat: 0.7 mg/dL (ref 0.50–1.10)
Phosphorus: 3 mg/dL (ref 2.3–4.6)
Potassium: 4.3 mEq/L (ref 3.5–5.3)

## 2012-12-26 NOTE — Patient Instructions (Addendum)
Back Pain, Adult  Low back pain is very common. About 1 in 5 people have back pain. The cause of low back pain is rarely dangerous. The pain often gets better over time. About half of people with a sudden onset of back pain feel better in just 2 weeks. About 8 in 10 people feel better by 6 weeks.   CAUSES  Some common causes of back pain include:  · Strain of the muscles or ligaments supporting the spine.  · Wear and tear (degeneration) of the spinal discs.  · Arthritis.  · Direct injury to the back.  DIAGNOSIS  Most of the time, the direct cause of low back pain is not known. However, back pain can be treated effectively even when the exact cause of the pain is unknown. Answering your caregiver's questions about your overall health and symptoms is one of the most accurate ways to make sure the cause of your pain is not dangerous. If your caregiver needs more information, he or she may order lab work or imaging tests (X-rays or MRIs). However, even if imaging tests show changes in your back, this usually does not require surgery.  HOME CARE INSTRUCTIONS  For many people, back pain returns. Since low back pain is rarely dangerous, it is often a condition that people can learn to manage on their own.   · Remain active. It is stressful on the back to sit or stand in one place. Do not sit, drive, or stand in one place for more than 30 minutes at a time. Take short walks on level surfaces as soon as pain allows. Try to increase the length of time you walk each day.  · Do not stay in bed. Resting more than 1 or 2 days can delay your recovery.  · Do not avoid exercise or work. Your body is made to move. It is not dangerous to be active, even though your back may hurt. Your back will likely heal faster if you return to being active before your pain is gone.  · Pay attention to your body when you  bend and lift. Many people have less discomfort when lifting if they bend their knees, keep the load close to their bodies, and  avoid twisting. Often, the most comfortable positions are those that put less stress on your recovering back.  · Find a comfortable position to sleep. Use a firm mattress and lie on your side with your knees slightly bent. If you lie on your back, put a pillow under your knees.  · Only take over-the-counter or prescription medicines as directed by your caregiver. Over-the-counter medicines to reduce pain and inflammation are often the most helpful. Your caregiver may prescribe muscle relaxant drugs. These medicines help dull your pain so you can more quickly return to your normal activities and healthy exercise.  · Put ice on the injured area.  · Put ice in a plastic bag.  · Place a towel between your skin and the bag.  · Leave the ice on for 15-20 minutes, 3-4 times a day for the first 2 to 3 days. After that, ice and heat may be alternated to reduce pain and spasms.  · Ask your caregiver about trying back exercises and gentle massage. This may be of some benefit.  · Avoid feeling anxious or stressed. Stress increases muscle tension and can worsen back pain. It is important to recognize when you are anxious or stressed and learn ways to manage it. Exercise is a great option.  SEEK MEDICAL CARE IF:  · You have pain that is not relieved with rest or   medicine.  · You have pain that does not improve in 1 week.  · You have new symptoms.  · You are generally not feeling well.  SEEK IMMEDIATE MEDICAL CARE IF:   · You have pain that radiates from your back into your legs.  · You develop new bowel or bladder control problems.  · You have unusual weakness or numbness in your arms or legs.  · You develop nausea or vomiting.  · You develop abdominal pain.  · You feel faint.  Document Released: 05/15/2005 Document Revised: 11/14/2011 Document Reviewed: 10/03/2010  [employer]® Patient Information ©2014 [employer].

## 2012-12-27 ENCOUNTER — Ambulatory Visit: Payer: BC Managed Care – PPO | Admitting: Family Medicine

## 2012-12-27 ENCOUNTER — Encounter: Payer: Self-pay | Admitting: Family Medicine

## 2012-12-27 LAB — CBC
MCHC: 33.3 g/dL (ref 30.0–36.0)
RDW: 14.4 % (ref 11.5–15.5)
WBC: 5.3 10*3/uL (ref 4.0–10.5)

## 2012-12-27 LAB — HEMOGLOBIN A1C: Hgb A1c MFr Bld: 5.9 % — ABNORMAL HIGH (ref <5.7)

## 2012-12-27 LAB — VITAMIN D 25 HYDROXY (VIT D DEFICIENCY, FRACTURES): Vit D, 25-Hydroxy: 23 ng/mL — ABNORMAL LOW (ref 30–89)

## 2012-12-27 NOTE — Progress Notes (Signed)
Patient ID: Margaret [name], female   DOB: [birth year], 62 y.o.   MRN: [id number] Margaret [name] [id number] Mar 28, 1951 12/27/2012      Progress Note-Follow Up  Subjective  Chief Complaint  Chief Complaint  Patient presents with  . Follow-up    6 week    HPI  Patient is a 62 year old female is in today in followup. She is struggling with some low back pain but no radicular symptoms no incontinence. Gets partial relief from Tylenol when necessary. Reports Robaxin caused excessive fatigue. No other acute complaints. No recent illness. No fevers or chills. No chest pain, palpitations, shortness of breath, GI or GU complaints noted.  Past Medical History  Diagnosis Date  . Chest pain   . Hyperlipidemia   . Sleep apnea   . GERD (gastroesophageal reflux disease)   . Obesity   . At risk for colon cancer     Last colonoscopy in [place] 03/2009 found hyperplastic polyp only  . Osteopenia 08/31/2012  . Left shoulder pain 11/14/2012    Past Surgical History  Procedure Laterality Date  . Dilation and curettage of uterus      Family History  Problem Relation Age of Onset  . Stroke Mother   . Lung cancer Father     History   Social History  . Marital Status: Married    Spouse Name: N/A    Number of Children: 2  . Years of Education: N/A   Occupational History  . Accountant    Social History Main Topics  . Smoking status: Never Smoker   . Smokeless tobacco: Never Used  . Alcohol Use: Yes  . Drug Use: No  . Sexually Active: Not on file   Other Topics Concern  . Not on file   Social History Narrative  . No narrative on file    Current Outpatient Prescriptions on File Prior to Visit  Medication Sig Dispense Refill  . calcium gluconate 500 MG tablet Take 1,000 mg by mouth daily.        . ergocalciferol (VITAMIN D2) 50000 UNITS capsule Take 1 capsule (50,000 Units total) by mouth once a week.  4 capsule  3  . fish oil-omega-3 fatty acids 1000 MG capsule Take 1 g by mouth  daily.        . Multiple Vitamin (MULTIVITAMIN PO) Take 1 tablet by mouth daily.        Marland Kitchen PROBIOTIC CAPS Digestive Advantage probiotic daily or a generic      . rosuvastatin (CRESTOR) 10 MG tablet Take 1 tablet (10 mg total) by mouth daily.  30 tablet  3  . traMADol (ULTRAM) 50 MG tablet Take 1 tablet (50 mg total) by mouth 3 (three) times daily as needed for pain. Severe pain  40 tablet  1   No current facility-administered medications on file prior to visit.    No Known Allergies  Review of Systems  Review of Systems  Constitutional: Negative for fever and malaise/fatigue.  HENT: Negative for congestion.   Eyes: Negative for pain and discharge.  Respiratory: Negative for shortness of breath.   Cardiovascular: Negative for chest pain, palpitations and leg swelling.  Gastrointestinal: Negative for nausea, abdominal pain and diarrhea.  Genitourinary: Negative for dysuria.  Musculoskeletal: Positive for back pain. Negative for falls.  Skin: Negative for rash.  Neurological: Negative for loss of consciousness and headaches.  Endo/Heme/Allergies: Negative for polydipsia.  Psychiatric/Behavioral: Negative for depression and suicidal ideas. The patient is not nervous/anxious and does not have insomnia.  Objective  BP 140/84  Pulse 73  Temp(Src) 98.4 F (36.9 C) (Oral)  Ht 5\' 2"  (1.575 m)  Wt 170 lb 0.6 oz (77.13 kg)  BMI 31.09 kg/m2  SpO2 97%  Physical Exam  Physical Exam  Constitutional: She is oriented to person, place, and time and well-developed, well-nourished, and in no distress. No distress.  HENT:  Head: Normocephalic and atraumatic.  Eyes: Conjunctivae are normal.  Neck: Neck supple. No thyromegaly present.  Cardiovascular: Normal rate, regular rhythm and normal heart sounds.  Exam reveals no gallop.   No murmur heard. Pulmonary/Chest: Effort normal and breath sounds normal. She has no wheezes.  Abdominal: She exhibits no distension and no mass.   Musculoskeletal: She exhibits no edema.  Lymphadenopathy:    She has no cervical adenopathy.  Neurological: She is alert and oriented to person, place, and time.  Skin: Skin is warm and dry. No rash noted. She is not diaphoretic.  Psychiatric: Memory, affect and judgment normal.    Lab Results  Component Value Date   TSH 0.993 06/01/2010   Lab Results  Component Value Date   WBC 6.5 08/21/2012   HGB 12.9 08/21/2012   HCT 38.4 08/21/2012   MCV 85.3 08/21/2012   PLT 238 08/21/2012   Lab Results  Component Value Date   CREATININE 0.70 11/14/2012   BUN 8 11/14/2012   NA 138 11/14/2012   K 4.3 11/14/2012   CL 105 11/14/2012   CO2 28 11/14/2012   Lab Results  Component Value Date   ALT 20 11/14/2012   AST 18 11/14/2012   ALKPHOS 70 11/14/2012   BILITOT 0.6 11/14/2012   Lab Results  Component Value Date   CHOL 191 11/14/2012   Lab Results  Component Value Date   HDL 51 11/14/2012   Lab Results  Component Value Date   LDLCALC 118* 11/14/2012   Lab Results  Component Value Date   TRIG 112 11/14/2012   Lab Results  Component Value Date   CHOLHDL 3.7 11/14/2012     Assessment & Plan  Back pain Gets some relief from Tylenol, may use Salon Pas patches. Moist heat and gentle stretching.   HYPERLIPIDEMIA Good response to Crestor, avoid trans fats, consider krill oil caps  GERD Avoid offending foods, continue probiotics, may use Zantac prn or Tums

## 2012-12-29 NOTE — Assessment & Plan Note (Signed)
Good response to Crestor, avoid trans fats, consider krill oil caps

## 2012-12-29 NOTE — Assessment & Plan Note (Signed)
Avoid offending foods, continue probiotics, may use Zantac prn or Tums

## 2012-12-29 NOTE — Assessment & Plan Note (Signed)
Gets some relief from Tylenol, may use Salon Pas patches. Moist heat and gentle stretching.

## 2012-12-30 ENCOUNTER — Ambulatory Visit: Payer: BC Managed Care – PPO | Admitting: Family Medicine

## 2012-12-30 ENCOUNTER — Telehealth: Payer: Self-pay

## 2012-12-30 NOTE — Telephone Encounter (Signed)
Pt left a message earlier stating she would like to know her lab results.   I left a detailed message on pts vm and mailed a copy of labs to pt.

## 2013-01-06 ENCOUNTER — Ambulatory Visit: Payer: BC Managed Care – PPO | Attending: Family Medicine | Admitting: Rehabilitation

## 2013-01-06 DIAGNOSIS — M545 Low back pain, unspecified: Secondary | ICD-10-CM | POA: Insufficient documentation

## 2013-01-06 DIAGNOSIS — IMO0001 Reserved for inherently not codable concepts without codable children: Secondary | ICD-10-CM | POA: Insufficient documentation

## 2013-01-06 DIAGNOSIS — M25519 Pain in unspecified shoulder: Secondary | ICD-10-CM | POA: Insufficient documentation

## 2013-01-09 ENCOUNTER — Ambulatory Visit: Payer: BC Managed Care – PPO | Admitting: Rehabilitation

## 2013-01-31 ENCOUNTER — Ambulatory Visit: Payer: BC Managed Care – PPO | Admitting: Rehabilitation

## 2013-02-10 ENCOUNTER — Ambulatory Visit: Payer: BC Managed Care – PPO | Attending: Family Medicine | Admitting: Rehabilitation

## 2013-02-10 DIAGNOSIS — M25519 Pain in unspecified shoulder: Secondary | ICD-10-CM | POA: Insufficient documentation

## 2013-02-10 DIAGNOSIS — IMO0001 Reserved for inherently not codable concepts without codable children: Secondary | ICD-10-CM | POA: Insufficient documentation

## 2013-02-10 DIAGNOSIS — M545 Low back pain, unspecified: Secondary | ICD-10-CM | POA: Insufficient documentation

## 2013-02-13 ENCOUNTER — Ambulatory Visit: Payer: BC Managed Care – PPO | Admitting: Rehabilitation

## 2013-02-17 ENCOUNTER — Ambulatory Visit: Payer: BC Managed Care – PPO | Admitting: Rehabilitation

## 2013-02-20 ENCOUNTER — Ambulatory Visit: Payer: BC Managed Care – PPO | Admitting: Rehabilitation

## 2013-02-24 ENCOUNTER — Ambulatory Visit: Payer: BC Managed Care – PPO | Admitting: Rehabilitation

## 2013-02-26 LAB — HM PAP SMEAR: HM Pap smear: NORMAL

## 2013-02-27 ENCOUNTER — Ambulatory Visit: Payer: BC Managed Care – PPO | Attending: Family Medicine | Admitting: Rehabilitation

## 2013-02-27 DIAGNOSIS — M545 Low back pain, unspecified: Secondary | ICD-10-CM | POA: Insufficient documentation

## 2013-02-27 DIAGNOSIS — M25519 Pain in unspecified shoulder: Secondary | ICD-10-CM | POA: Insufficient documentation

## 2013-02-27 DIAGNOSIS — IMO0001 Reserved for inherently not codable concepts without codable children: Secondary | ICD-10-CM | POA: Insufficient documentation

## 2013-03-03 ENCOUNTER — Ambulatory Visit: Payer: BC Managed Care – PPO | Admitting: Rehabilitation

## 2013-03-06 ENCOUNTER — Ambulatory Visit: Payer: BC Managed Care – PPO | Admitting: Rehabilitation

## 2013-03-10 ENCOUNTER — Ambulatory Visit: Payer: BC Managed Care – PPO | Admitting: Rehabilitation

## 2013-03-12 ENCOUNTER — Encounter: Payer: BC Managed Care – PPO | Admitting: Rehabilitation

## 2013-03-14 ENCOUNTER — Emergency Department (HOSPITAL_BASED_OUTPATIENT_CLINIC_OR_DEPARTMENT_OTHER)
Admission: EM | Admit: 2013-03-14 | Discharge: 2013-03-14 | Disposition: A | Discharge status: Home / Self Care | Payer: BC Managed Care – PPO | Attending: Emergency Medicine | Admitting: Emergency Medicine

## 2013-03-14 ENCOUNTER — Encounter (HOSPITAL_BASED_OUTPATIENT_CLINIC_OR_DEPARTMENT_OTHER): Payer: Self-pay | Admitting: Emergency Medicine

## 2013-03-14 DIAGNOSIS — R51 Headache: Secondary | ICD-10-CM | POA: Insufficient documentation

## 2013-03-14 DIAGNOSIS — M791 Myalgia, unspecified site: Secondary | ICD-10-CM

## 2013-03-14 DIAGNOSIS — Z789 Other specified health status: Secondary | ICD-10-CM | POA: Insufficient documentation

## 2013-03-14 DIAGNOSIS — Z8719 Personal history of other diseases of the digestive system: Secondary | ICD-10-CM | POA: Insufficient documentation

## 2013-03-14 DIAGNOSIS — Z79899 Other long term (current) drug therapy: Secondary | ICD-10-CM | POA: Insufficient documentation

## 2013-03-14 DIAGNOSIS — IMO0001 Reserved for inherently not codable concepts without codable children: Secondary | ICD-10-CM | POA: Insufficient documentation

## 2013-03-14 DIAGNOSIS — E669 Obesity, unspecified: Secondary | ICD-10-CM | POA: Insufficient documentation

## 2013-03-14 DIAGNOSIS — E785 Hyperlipidemia, unspecified: Secondary | ICD-10-CM | POA: Insufficient documentation

## 2013-03-14 MED ORDER — IBUPROFEN 400 MG PO TABS: 400.0000 mg | ORAL_TABLET | Freq: Four times a day (QID) | ORAL | Status: DC | PRN

## 2013-03-14 NOTE — ED Notes (Signed)
Pt reports left sided facial numbness that started 2 days ago.  States she initially thought it was an dental issue, but area continues to feel numb.

## 2013-03-14 NOTE — ED Provider Notes (Signed)
CSN: [id number]     Arrival date & time 03/14/13  0846 History   First MD Initiated Contact with Patient 03/14/13 [phone]     Chief Complaint  Patient presents with  . Numbness   (Consider location/radiation/quality/duration/timing/severity/associated sxs/prior Treatment) The history is provided by the patient.   patient states that she has had some "numbness" in the left side of her face for last couple days. It has come and gone. There is no weakness. She states she initially thought it was a tooth causing a problem. She states it feels like a fullness. She also is a dull frontal headache. No numbness or weakness. Confusion. No history of strokes. She does not smoke. No chest pain. No nausea vomiting. No difficulty walking. She states she thinks she may need new glasses. She's been using reading glasses but states it is becoming more difficult.  Past Medical History  Diagnosis Date  . Chest pain   . Hyperlipidemia   . Sleep apnea   . GERD (gastroesophageal reflux disease)   . Obesity   . At risk for colon cancer     Last colonoscopy in [place] 03/2009 found hyperplastic polyp only  . Osteopenia 08/31/2012  . Left shoulder pain 11/14/2012   Past Surgical History  Procedure Laterality Date  . Dilation and curettage of uterus     Family History  Problem Relation Age of Onset  . Stroke Mother   . Lung cancer Father    History  Substance Use Topics  . Smoking status: Never Smoker   . Smokeless tobacco: Never Used  . Alcohol Use: No   OB History   Grav Para Term Preterm Abortions TAB SAB Ect Mult Living                 Review of Systems  Constitutional: Negative for activity change and appetite change.  Eyes: Negative for pain.  Respiratory: Negative for chest tightness and shortness of breath.   Cardiovascular: Negative for chest pain and leg swelling.  Gastrointestinal: Negative for nausea, vomiting, abdominal pain and diarrhea.  Genitourinary: Negative for flank pain.   Musculoskeletal: Negative for back pain and neck stiffness.  Skin: Negative for rash.  Neurological: Positive for numbness and headaches. Negative for weakness.  Psychiatric/Behavioral: Negative for behavioral problems.    Allergies  Review of patient's allergies indicates no known allergies.  Home Medications   Current Outpatient Rx  Name  Route  Sig  Dispense  Refill  . ibuprofen (ADVIL,MOTRIN) 400 MG tablet   Oral   Take 1 tablet (400 mg total) by mouth every 6 (six) hours as needed for pain.   30 tablet   0   . rosuvastatin (CRESTOR) 10 MG tablet   Oral   Take 1 tablet (10 mg total) by mouth daily.   30 tablet   3    BP 139/98  Pulse 67  Temp(Src) 97.6 F (36.4 C) (Oral)  Resp 18  Ht 5\' 2"  (1.575 m)  Wt 170 lb (77.111 kg)  BMI 31.09 kg/m2  SpO2 100% Physical Exam  Nursing note and vitals reviewed. Constitutional: She is oriented to person, place, and time. She appears well-developed and well-nourished.  HENT:  Head: Normocephalic and atraumatic.  Some tightness of muscles behind the angle of jaw on left. No TMJ tenderness.  Eyes: EOM are normal. Pupils are equal, round, and reactive to light.  Neck: Normal range of motion. Neck supple.  Cardiovascular: Normal rate, regular rhythm and normal heart sounds.  No murmur heard. Pulmonary/Chest: Effort normal and breath sounds normal. No respiratory distress. She has no wheezes. She has no rales.  Abdominal: Soft. Bowel sounds are normal. She exhibits no distension. There is no tenderness. There is no rebound and no guarding.  Musculoskeletal: Normal range of motion.  Neurological: She is alert and oriented to person, place, and time. No cranial nerve deficit.  Patient is able to ambulate normally. Sensation intact over face. Face symmetric. No facial droop. Extraocular movements intact.  Skin: Skin is warm and dry.  Psychiatric: She has a normal mood and affect. Her speech is normal.    ED Course  Procedures  (including critical care time) Labs Review Labs Reviewed - No data to display Imaging Review No results found.  EKG Interpretation   None       MDM   1. Muscle pain    Patient with a fullness of the left side of her face. Benign examination without neuro deficits. There is a spasm of the muscles behind her jaw on the left. This could be the cause. Doubt CVA as a cause. Doubt severe infection as a cause. Will discharge home with anti-inflammatories. Will followup as needed.    Juliet Rude. Rubin Payor, MD 03/14/13 [phone]

## 2013-03-24 ENCOUNTER — Telehealth: Payer: Self-pay | Admitting: Family Medicine

## 2013-03-24 ENCOUNTER — Ambulatory Visit (INDEPENDENT_AMBULATORY_CARE_PROVIDER_SITE_OTHER): Payer: BC Managed Care – PPO | Admitting: Family Medicine

## 2013-03-24 ENCOUNTER — Encounter: Payer: Self-pay | Admitting: Family Medicine

## 2013-03-24 VITALS — BP 100/72 | HR 76 | Temp 98.0°F | Ht 62.0 in | Wt 173.0 lb

## 2013-03-24 DIAGNOSIS — R7309 Other abnormal glucose: Secondary | ICD-10-CM

## 2013-03-24 DIAGNOSIS — R739 Hyperglycemia, unspecified: Secondary | ICD-10-CM

## 2013-03-24 DIAGNOSIS — Z0000 Encounter for general adult medical examination without abnormal findings: Secondary | ICD-10-CM

## 2013-03-24 DIAGNOSIS — G4733 Obstructive sleep apnea (adult) (pediatric): Secondary | ICD-10-CM

## 2013-03-24 DIAGNOSIS — J329 Chronic sinusitis, unspecified: Secondary | ICD-10-CM

## 2013-03-24 DIAGNOSIS — G4730 Sleep apnea, unspecified: Secondary | ICD-10-CM

## 2013-03-24 DIAGNOSIS — R300 Dysuria: Secondary | ICD-10-CM

## 2013-03-24 DIAGNOSIS — K219 Gastro-esophageal reflux disease without esophagitis: Secondary | ICD-10-CM

## 2013-03-24 DIAGNOSIS — Z5189 Encounter for other specified aftercare: Secondary | ICD-10-CM

## 2013-03-24 DIAGNOSIS — E785 Hyperlipidemia, unspecified: Secondary | ICD-10-CM

## 2013-03-24 DIAGNOSIS — Z23 Encounter for immunization: Secondary | ICD-10-CM

## 2013-03-24 DIAGNOSIS — T7840XD Allergy, unspecified, subsequent encounter: Secondary | ICD-10-CM

## 2013-03-24 DIAGNOSIS — J019 Acute sinusitis, unspecified: Secondary | ICD-10-CM

## 2013-03-24 DIAGNOSIS — M8580 Other specified disorders of bone density and structure, unspecified site: Secondary | ICD-10-CM

## 2013-03-24 LAB — LIPID PANEL: Cholesterol: 196 mg/dL (ref 0–200)

## 2013-03-24 LAB — RENAL FUNCTION PANEL
CO2: 29 mEq/L (ref 19–32)
Chloride: 105 mEq/L (ref 96–112)
Glucose, Bld: 108 mg/dL — ABNORMAL HIGH (ref 70–99)
Potassium: 4.3 mEq/L (ref 3.5–5.3)
Sodium: 139 mEq/L (ref 135–145)

## 2013-03-24 LAB — HEMOGLOBIN A1C
Hgb A1c MFr Bld: 6.1 % — ABNORMAL HIGH (ref <5.7)
Mean Plasma Glucose: 128 mg/dL — ABNORMAL HIGH (ref <117)

## 2013-03-24 LAB — HEPATIC FUNCTION PANEL
ALT: 26 U/L (ref 0–35)
AST: 21 U/L (ref 0–37)
Alkaline Phosphatase: 77 U/L (ref 39–117)
Bilirubin, Direct: 0.1 mg/dL (ref 0.0–0.3)
Indirect Bilirubin: 0.2 mg/dL (ref 0.0–0.9)

## 2013-03-24 MED ORDER — CIPROFLOXACIN HCL 500 MG PO TABS: 500.0000 mg | ORAL_TABLET | Freq: Two times a day (BID) | ORAL | Status: DC

## 2013-03-24 NOTE — Patient Instructions (Signed)
Add Mucinex 600 mg po twice daily x 10 days with antibiotics  Sinusitis Sinusitis is redness, soreness, and swelling (inflammation) of the paranasal sinuses. Paranasal sinuses are air pockets within the bones of your face (beneath the eyes, the middle of the forehead, or above the eyes). In healthy paranasal sinuses, mucus is able to drain out, and air is able to circulate through them by way of your nose. However, when your paranasal sinuses are inflamed, mucus and air can become trapped. This can allow bacteria and other germs to grow and cause infection. Sinusitis can develop quickly and last only a short time (acute) or continue over a long period (chronic). Sinusitis that lasts for more than 12 weeks is considered chronic.  CAUSES  Causes of sinusitis include:  Allergies.  Structural abnormalities, such as displacement of the cartilage that separates your nostrils (deviated septum), which can decrease the air flow through your nose and sinuses and affect sinus drainage.  Functional abnormalities, such as when the small hairs (cilia) that line your sinuses and help remove mucus do not work properly or are not present. SYMPTOMS  Symptoms of acute and chronic sinusitis are the same. The primary symptoms are pain and pressure around the affected sinuses. Other symptoms include:  Upper toothache.  Earache.  Headache.  Bad breath.  Decreased sense of smell and taste.  A cough, which worsens when you are lying flat.  Fatigue.  Fever.  Thick drainage from your nose, which often is green and may contain pus (purulent).  Swelling and warmth over the affected sinuses. DIAGNOSIS  Your caregiver will perform a physical exam. During the exam, your caregiver may:  Look in your nose for signs of abnormal growths in your nostrils (nasal polyps).  Tap over the affected sinus to check for signs of infection.  View the inside of your sinuses (endoscopy) with a special imaging device with a  light attached (endoscope), which is inserted into your sinuses. If your caregiver suspects that you have chronic sinusitis, one or more of the following tests may be recommended:  Allergy tests.  Nasal culture A sample of mucus is taken from your nose and sent to a lab and screened for bacteria.  Nasal cytology A sample of mucus is taken from your nose and examined by your caregiver to determine if your sinusitis is related to an allergy. TREATMENT  Most cases of acute sinusitis are related to a viral infection and will resolve on their own within 10 days. Sometimes medicines are prescribed to help relieve symptoms (pain medicine, decongestants, nasal steroid sprays, or saline sprays).  However, for sinusitis related to a bacterial infection, your caregiver will prescribe antibiotic medicines. These are medicines that will help kill the bacteria causing the infection.  Rarely, sinusitis is caused by a fungal infection. In theses cases, your caregiver will prescribe antifungal medicine. For some cases of chronic sinusitis, surgery is needed. Generally, these are cases in which sinusitis recurs more than 3 times per year, despite other treatments. HOME CARE INSTRUCTIONS   Drink plenty of water. Water helps thin the mucus so your sinuses can drain more easily.  Use a humidifier.  Inhale steam 3 to 4 times a day (for example, sit in the bathroom with the shower running).  Apply a warm, moist washcloth to your face 3 to 4 times a day, or as directed by your caregiver.  Use saline nasal sprays to help moisten and clean your sinuses.  Take over-the-counter or prescription medicines for  pain, discomfort, or fever only as directed by your caregiver. SEEK IMMEDIATE MEDICAL CARE IF:  You have increasing pain or severe headaches.  You have nausea, vomiting, or drowsiness.  You have swelling around your face.  You have vision problems.  You have a stiff neck.  You have difficulty  breathing. MAKE SURE YOU:   Understand these instructions.  Will watch your condition.  Will get help right away if you are not doing well or get worse. Document Released: 05/15/2005 Document Revised: 08/07/2011 Document Reviewed: 05/30/2011 [facility] Patient Information 2014 [place], [place].

## 2013-03-24 NOTE — Telephone Encounter (Signed)
Lab order week of 07-14-2013 Labs at or prior to visit, lipid, renal, cbc, tsh, hgba1c. Vitamin d

## 2013-03-24 NOTE — Progress Notes (Signed)
Patient ID: Margaret [name], female   DOB: [birth year], 62 y.o.   MRN: [id number] Margaret [name] [id number] 1950-11-16 03/24/2013      Progress Note-Follow Up  Subjective  Chief Complaint  Chief Complaint  Patient presents with  . Follow-up    Hospital and 3 month  . Injections    flu    HPI  Patient is a 62 year old female who is in today for followup. She was in the emergency room roughly 2 weeks ago with some left facial numbness. No acute cause was found but in retrospect she realizes her sinuses been flared for a month. She's had chills and malaise. She's had some headaches. She's had right-sided facial congestion and pain. She says sometimes even her teeth hurt. A lot of rhinorrhea and itchy watery eyes and nose as well. He's not taking any medications at bedtime. Describes a sense of heavy fatigue and lightheadedness at times as well. Denies chest pain, palpitations, shortness of breath, GI or GU concerns at this time  Past Medical History  Diagnosis Date  . Chest pain   . Hyperlipidemia   . Sleep apnea   . GERD (gastroesophageal reflux disease)   . Obesity   . At risk for colon cancer     Last colonoscopy in [place] 03/2009 found hyperplastic polyp only  . Osteopenia 08/31/2012  . Left shoulder pain 11/14/2012    Past Surgical History  Procedure Laterality Date  . Dilation and curettage of uterus      Family History  Problem Relation Age of Onset  . Stroke Mother   . Lung cancer Father     History   Social History  . Marital Status: Married    Spouse Name: N/A    Number of Children: 2  . Years of Education: N/A   Occupational History  . Accountant    Social History Main Topics  . Smoking status: Never Smoker   . Smokeless tobacco: Never Used  . Alcohol Use: No  . Drug Use: No  . Sexual Activity: Not on file   Other Topics Concern  . Not on file   Social History Narrative  . No narrative on file    Current Outpatient Prescriptions on File Prior to  Visit  Medication Sig Dispense Refill  . ibuprofen (ADVIL,MOTRIN) 400 MG tablet Take 1 tablet (400 mg total) by mouth every 6 (six) hours as needed for pain.  30 tablet  0  . rosuvastatin (CRESTOR) 10 MG tablet Take 1 tablet (10 mg total) by mouth daily.  30 tablet  3   No current facility-administered medications on file prior to visit.    No Known Allergies  Review of Systems  Review of Systems  Constitutional: Negative for fever and malaise/fatigue.  HENT: Positive for congestion, ear pain and sore throat. Negative for hearing loss and tinnitus.   Eyes: Negative for discharge.  Respiratory: Negative for cough, sputum production, shortness of breath and stridor.   Cardiovascular: Negative for chest pain, palpitations and leg swelling.  Gastrointestinal: Negative for nausea, abdominal pain and diarrhea.  Genitourinary: Negative for dysuria.  Musculoskeletal: Negative for falls.  Skin: Negative for rash.  Neurological: Negative for loss of consciousness and headaches.  Endo/Heme/Allergies: Negative for polydipsia.  Psychiatric/Behavioral: Negative for depression and suicidal ideas. The patient is not nervous/anxious and does not have insomnia.     Objective  BP 100/72  Pulse 76  Temp(Src) 98 F (36.7 C) (Oral)  Ht 5\' 2"  (1.575 m)  Wt 173 lb (  78.472 kg)  BMI 31.63 kg/m2  SpO2 95%  Physical Exam  Physical Exam  Constitutional: She is oriented to person, place, and time and well-developed, well-nourished, and in no distress. No distress.  HENT:  Head: Normocephalic and atraumatic.  Eyes: Conjunctivae are normal.  Neck: Neck supple. No thyromegaly present.  Cardiovascular: Normal rate, regular rhythm and normal heart sounds.   No murmur heard. Pulmonary/Chest: Effort normal and breath sounds normal. She has no wheezes.  Abdominal: She exhibits no distension and no mass.  Musculoskeletal: She exhibits no edema.  Lymphadenopathy:    She has no cervical adenopathy.   Neurological: She is alert and oriented to person, place, and time.  Skin: Skin is warm and dry. No rash noted. She is not diaphoretic.  Psychiatric: Memory, affect and judgment normal.    Lab Results  Component Value Date   TSH 0.993 06/01/2010   Lab Results  Component Value Date   WBC 5.3 11/14/2012   HGB 12.9 11/14/2012   HCT 38.7 11/14/2012   MCV 84.1 11/14/2012   PLT 250 11/14/2012   Lab Results  Component Value Date   CREATININE 0.70 11/14/2012   BUN 8 11/14/2012   NA 138 11/14/2012   K 4.3 11/14/2012   CL 105 11/14/2012   CO2 28 11/14/2012   Lab Results  Component Value Date   ALT 20 11/14/2012   AST 18 11/14/2012   ALKPHOS 70 11/14/2012   BILITOT 0.6 11/14/2012   Lab Results  Component Value Date   CHOL 191 11/14/2012   Lab Results  Component Value Date   HDL 51 11/14/2012   Lab Results  Component Value Date   LDLCALC 118* 11/14/2012   Lab Results  Component Value Date   TRIG 112 11/14/2012   Lab Results  Component Value Date   CHOLHDL 3.7 11/14/2012      Assessment & Plan  SLEEP APNEA Patient reports has old CPAP machine has not been reevaluated in 5-6 years. Referred to Pulmonology for further consider and possible repeat study  Unspecified sinusitis (chronic) Has been struggling with symptoms for over a month. Was seen in the emergency department with some right-sided facial numbness but also some pain across the left side of her face and into her left ear. Has a great deal of congestion and malaise as well. Has had some headaches. Does acknowledge her allergies got out of control and then her symptoms worsened. Will start a course of antibiotics as well as Mucinex and probiotics and she will report if symptoms worsen to  Allergic state Encouraged daily cetirizine and nasal saline flushes.  GERD Improved with probiotics and avoiding offending foods. No further medications at this time.  HYPERLIPIDEMIA Tolerating Crestor. Avoid transplants and crit will  continue to monitor.  Preventative health care Agree to flu shot today and will consider pneumonia shot.

## 2013-03-25 LAB — URINE CULTURE: Colony Count: NO GROWTH

## 2013-03-30 ENCOUNTER — Encounter: Payer: Self-pay | Admitting: Family Medicine

## 2013-03-30 DIAGNOSIS — J329 Chronic sinusitis, unspecified: Secondary | ICD-10-CM

## 2013-03-30 DIAGNOSIS — Z0000 Encounter for general adult medical examination without abnormal findings: Secondary | ICD-10-CM | POA: Insufficient documentation

## 2013-03-30 HISTORY — DX: Chronic sinusitis, unspecified: J32.9

## 2013-03-30 NOTE — Assessment & Plan Note (Signed)
Patient reports has old CPAP machine has not been reevaluated in 5-6 years. Referred to Pulmonology for further consider and possible repeat study

## 2013-03-30 NOTE — Assessment & Plan Note (Signed)
Improved with probiotics and avoiding offending foods. No further medications at this time.

## 2013-03-30 NOTE — Assessment & Plan Note (Signed)
Has been struggling with symptoms for over a month. Was seen in the emergency department with some right-sided facial numbness but also some pain across the left side of her face and into her left ear. Has a great deal of congestion and malaise as well. Has had some headaches. Does acknowledge her allergies got out of control and then her symptoms worsened. Will start a course of antibiotics as well as Mucinex and probiotics and she will report if symptoms worsen to

## 2013-03-30 NOTE — Assessment & Plan Note (Signed)
Tolerating Crestor. Avoid transplants and crit will continue to monitor.

## 2013-03-30 NOTE — Assessment & Plan Note (Signed)
Encouraged daily cetirizine and nasal saline flushes.

## 2013-03-30 NOTE — Assessment & Plan Note (Signed)
Agree to flu shot today and will consider pneumonia shot.

## 2013-04-10 ENCOUNTER — Ambulatory Visit (INDEPENDENT_AMBULATORY_CARE_PROVIDER_SITE_OTHER): Payer: BC Managed Care – PPO | Admitting: Family Medicine

## 2013-04-10 ENCOUNTER — Ambulatory Visit: Payer: BC Managed Care – PPO

## 2013-04-10 DIAGNOSIS — Z23 Encounter for immunization: Secondary | ICD-10-CM

## 2013-04-10 DIAGNOSIS — H60399 Other infective otitis externa, unspecified ear: Secondary | ICD-10-CM

## 2013-04-10 DIAGNOSIS — H6690 Otitis media, unspecified, unspecified ear: Secondary | ICD-10-CM

## 2013-04-10 DIAGNOSIS — H60392 Other infective otitis externa, left ear: Secondary | ICD-10-CM

## 2013-04-10 MED ORDER — AMOXICILLIN-POT CLAVULANATE 875-125 MG PO TABS: 1.0000 | ORAL_TABLET | Freq: Two times a day (BID) | ORAL | Status: DC

## 2013-04-10 NOTE — Progress Notes (Signed)
Pre visit review using our clinic review tool, if applicable. No additional management support is needed unless otherwise documented below in the visit note/SLS  

## 2013-04-10 NOTE — Patient Instructions (Signed)

## 2013-04-15 ENCOUNTER — Encounter: Payer: Self-pay | Admitting: Family Medicine

## 2013-04-15 DIAGNOSIS — H6690 Otitis media, unspecified, unspecified ear: Secondary | ICD-10-CM | POA: Insufficient documentation

## 2013-04-15 NOTE — Progress Notes (Signed)
Margaret [name] [id number] 10-Apr-1951 04/15/2013      Progress Note-Follow Up  Subjective  Chief Complaint  No chief complaint on file.   HPI  Is a 62 year old Caucasian female who is in today for evaluation of respiratory symptoms. She has ear pain and congestion. Malaise and fatigue. Possible low-grade fevers and chills. Congestion is noted. No chest pain no palpitations or shortness of breath. No GI or GU concerns  Past Medical History  Diagnosis Date  . Chest pain   . Hyperlipidemia   . Sleep apnea   . GERD (gastroesophageal reflux disease)   . Obesity   . At risk for colon cancer     Last colonoscopy in [place] 03/2009 found hyperplastic polyp only  . Osteopenia 08/31/2012  . Left shoulder pain 11/14/2012  . Unspecified sinusitis (chronic) 03/30/2013  . Allergic state 03/25/2012  . Preventative health care 03/30/2013    Past Surgical History  Procedure Laterality Date  . Dilation and curettage of uterus      Family History  Problem Relation Age of Onset  . Stroke Mother   . Lung cancer Father     History   Social History  . Marital Status: Married    Spouse Name: N/A    Number of Children: 2  . Years of Education: N/A   Occupational History  . Accountant    Social History Main Topics  . Smoking status: Never Smoker   . Smokeless tobacco: Never Used  . Alcohol Use: No  . Drug Use: No  . Sexual Activity: Not on file   Other Topics Concern  . Not on file   Social History Narrative  . No narrative on file    Current Outpatient Prescriptions on File Prior to Visit  Medication Sig Dispense Refill  . calcium carbonate (TUMS - DOSED IN MG ELEMENTAL CALCIUM) 500 MG chewable tablet Chew 1 tablet by mouth daily.      . cholecalciferol (VITAMIN D) 1000 UNITS tablet Take 1,000 Units by mouth daily.      . fish oil-omega-3 fatty acids 1000 MG capsule Take 2 g by mouth daily.      Marland Kitchen ibuprofen (ADVIL,MOTRIN) 400 MG tablet Take 1 tablet (400 mg total) by mouth every  6 (six) hours as needed for pain.  30 tablet  0  . lactobacillus acidophilus (BACID) TABS tablet Take 1 tablet by mouth daily.      . MULTIPLE VITAMINS PO Take by mouth daily.      . rosuvastatin (CRESTOR) 10 MG tablet Take 1 tablet (10 mg total) by mouth daily.  30 tablet  3  . traMADol (ULTRAM) 50 MG tablet Take 50 mg by mouth every 6 (six) hours as needed for pain.       No current facility-administered medications on file prior to visit.    No Known Allergies  Review of Systems  Review of Systems  Constitutional: Negative for fever and malaise/fatigue.  HENT: Positive for congestion and ear pain.   Eyes: Negative for discharge.  Respiratory: Positive for cough. Negative for shortness of breath.   Cardiovascular: Negative for chest pain, palpitations and leg swelling.  Gastrointestinal: Negative for nausea, abdominal pain and diarrhea.  Genitourinary: Negative for dysuria.  Musculoskeletal: Negative for falls.  Skin: Negative for rash.  Neurological: Positive for headaches. Negative for loss of consciousness.  Endo/Heme/Allergies: Negative for polydipsia.  Psychiatric/Behavioral: Negative for depression and suicidal ideas. The patient is not nervous/anxious and does not have insomnia.  Objective  There were no vitals taken for this visit.  Physical Exam  Physical Exam  Constitutional: She is oriented to person, place, and time and well-developed, well-nourished, and in no distress. No distress.  HENT:  Head: Normocephalic and atraumatic.  TMs mildly erythematous,  Eyes: Conjunctivae are normal.  Neck: Neck supple. No thyromegaly present.  Cardiovascular: Normal rate, regular rhythm and normal heart sounds.   No murmur heard. Pulmonary/Chest: Effort normal and breath sounds normal. She has no wheezes.  Abdominal: She exhibits no distension and no mass.  Musculoskeletal: She exhibits no edema.  Lymphadenopathy:    She has no cervical adenopathy.  Neurological: She  is alert and oriented to person, place, and time.  Skin: Skin is warm and dry. No rash noted. She is not diaphoretic.  Psychiatric: Memory, affect and judgment normal.    Lab Results  Component Value Date   TSH 1.140 03/24/2013   Lab Results  Component Value Date   WBC 5.3 11/14/2012   HGB 12.9 11/14/2012   HCT 38.7 11/14/2012   MCV 84.1 11/14/2012   PLT 250 11/14/2012   Lab Results  Component Value Date   CREATININE 0.64 03/24/2013   BUN 15 03/24/2013   NA 139 03/24/2013   K 4.3 03/24/2013   CL 105 03/24/2013   CO2 29 03/24/2013   Lab Results  Component Value Date   ALT 26 03/24/2013   AST 21 03/24/2013   ALKPHOS 77 03/24/2013   BILITOT 0.3 03/24/2013   Lab Results  Component Value Date   CHOL 196 03/24/2013   Lab Results  Component Value Date   HDL 55 03/24/2013   Lab Results  Component Value Date   LDLCALC 114* 03/24/2013   Lab Results  Component Value Date   TRIG 137 03/24/2013   Lab Results  Component Value Date   CHOLHDL 3.6 03/24/2013     Assessment & Plan  Otitis media Started on antibiotics, probiotics and increased rest, call if no improvement

## 2013-04-15 NOTE — Assessment & Plan Note (Signed)
Started on antibiotics, probiotics and increased rest, call if no improvement

## 2013-04-22 ENCOUNTER — Encounter: Payer: Self-pay | Admitting: Pulmonary Disease

## 2013-04-22 ENCOUNTER — Ambulatory Visit (INDEPENDENT_AMBULATORY_CARE_PROVIDER_SITE_OTHER): Payer: BC Managed Care – PPO | Admitting: Pulmonary Disease

## 2013-04-22 VITALS — BP 132/60 | HR 76 | Temp 98.4°F | Ht 62.0 in | Wt 175.0 lb

## 2013-04-22 DIAGNOSIS — G4730 Sleep apnea, unspecified: Secondary | ICD-10-CM

## 2013-04-22 NOTE — Assessment & Plan Note (Signed)
We will check download CPAP supplies will be provided  Weight loss encouraged, compliance with goal of at least 4-6 hrs every night is the expectation. Advised against medications with sedative side effects Cautioned against driving when sleepy - understanding that sleepiness will vary on a day to day basis

## 2013-04-22 NOTE — Progress Notes (Signed)
  Subjective:    Patient ID: Margaret [name], female    DOB: [birth year], 62 y.o.   MRN: [id number]  HPI  62/F, Filipino woman, never smoker, for management of obstructive sleep apnea.  She moved from [place]  In 2010  PSG 1/09 >> AHI 49/h, lowest desatn 71%, , corrected by CPAP 9 cm, placed on auto CPAP with nasal pillows    04/22/2013 3y FU  On CPAP 10 cm Compliant by report She reports better compliance with new nasal mask , at least 4 h   Travelling to [place] in 2 wks Mask ok, pressur eok, wonders if she needs new cpap - current one x 60yrs There is no history suggestive of cataplexy, sleep paralysis or parasomnias  Bedtime MN to 1 A, used to be 10p, stays in bed sometimes until 8A or 10A, rested with cpap  sleeps on side x 2 pillows Wt stable    Review of Systems neg for any significant sore throat, dysphagia, itching, sneezing, nasal congestion or excess/ purulent secretions, fever, chills, sweats, unintended wt loss, pleuritic or exertional cp, hempoptysis, orthopnea pnd or change in chronic leg swelling. Also denies presyncope, palpitations, heartburn, abdominal pain, nausea, vomiting, diarrhea or change in bowel or urinary habits, dysuria,hematuria, rash, arthralgias, visual complaints, headache, numbness weakness or ataxia.     Objective:   Physical Exam  Gen. Pleasant, obese, in no distress ENT - no lesions, no post nasal drip Neck: No JVD, no thyromegaly, no carotid bruits Lungs: no use of accessory muscles, no dullness to percussion, decreased without rales or rhonchi  Cardiovascular: Rhythm regular, heart sounds  normal, no murmurs or gallops, no peripheral edema Musculoskeletal: No deformities, no cyanosis or clubbing , no tremors       Assessment & Plan:

## 2013-04-22 NOTE — Patient Instructions (Signed)
We will check download CPAP supplies will be provided

## 2013-06-28 ENCOUNTER — Emergency Department (HOSPITAL_BASED_OUTPATIENT_CLINIC_OR_DEPARTMENT_OTHER): Payer: BC Managed Care – PPO

## 2013-06-28 ENCOUNTER — Emergency Department (HOSPITAL_BASED_OUTPATIENT_CLINIC_OR_DEPARTMENT_OTHER)
Admission: EM | Admit: 2013-06-28 | Discharge: 2013-06-28 | Disposition: A | Discharge status: Home / Self Care | Payer: BC Managed Care – PPO | Attending: Emergency Medicine | Admitting: Emergency Medicine

## 2013-06-28 ENCOUNTER — Encounter (HOSPITAL_BASED_OUTPATIENT_CLINIC_OR_DEPARTMENT_OTHER): Payer: Self-pay | Admitting: Emergency Medicine

## 2013-06-28 DIAGNOSIS — Z792 Long term (current) use of antibiotics: Secondary | ICD-10-CM | POA: Insufficient documentation

## 2013-06-28 DIAGNOSIS — Z8709 Personal history of other diseases of the respiratory system: Secondary | ICD-10-CM | POA: Insufficient documentation

## 2013-06-28 DIAGNOSIS — R0789 Other chest pain: Secondary | ICD-10-CM | POA: Insufficient documentation

## 2013-06-28 DIAGNOSIS — Z8719 Personal history of other diseases of the digestive system: Secondary | ICD-10-CM | POA: Insufficient documentation

## 2013-06-28 DIAGNOSIS — E669 Obesity, unspecified: Secondary | ICD-10-CM | POA: Insufficient documentation

## 2013-06-28 DIAGNOSIS — E785 Hyperlipidemia, unspecified: Secondary | ICD-10-CM | POA: Insufficient documentation

## 2013-06-28 DIAGNOSIS — R110 Nausea: Secondary | ICD-10-CM | POA: Insufficient documentation

## 2013-06-28 DIAGNOSIS — Z85038 Personal history of other malignant neoplasm of large intestine: Secondary | ICD-10-CM | POA: Insufficient documentation

## 2013-06-28 DIAGNOSIS — Z8739 Personal history of other diseases of the musculoskeletal system and connective tissue: Secondary | ICD-10-CM | POA: Insufficient documentation

## 2013-06-28 DIAGNOSIS — Z8669 Personal history of other diseases of the nervous system and sense organs: Secondary | ICD-10-CM | POA: Insufficient documentation

## 2013-06-28 DIAGNOSIS — Z79899 Other long term (current) drug therapy: Secondary | ICD-10-CM | POA: Insufficient documentation

## 2013-06-28 LAB — CBC WITH DIFFERENTIAL/PLATELET
BASOS ABS: 0.1 10*3/uL (ref 0.0–0.1)
Basophils Relative: 1 % (ref 0–1)
EOS ABS: 0.5 10*3/uL (ref 0.0–0.7)
EOS PCT: 7 % — AB (ref 0–5)
HCT: 38.5 % (ref 36.0–46.0)
Hemoglobin: 12.6 g/dL (ref 12.0–15.0)
LYMPHS PCT: 42 % (ref 12–46)
Lymphs Abs: 2.9 10*3/uL (ref 0.7–4.0)
MCH: 28.4 pg (ref 26.0–34.0)
MCHC: 32.7 g/dL (ref 30.0–36.0)
MCV: 86.9 fL (ref 78.0–100.0)
Monocytes Absolute: 0.6 10*3/uL (ref 0.1–1.0)
Monocytes Relative: 9 % (ref 3–12)
Neutro Abs: 2.7 10*3/uL (ref 1.7–7.7)
Neutrophils Relative %: 40 % — ABNORMAL LOW (ref 43–77)
PLATELETS: 227 10*3/uL (ref 150–400)
RBC: 4.43 MIL/uL (ref 3.87–5.11)
RDW: 13.4 % (ref 11.5–15.5)
WBC: 6.8 10*3/uL (ref 4.0–10.5)

## 2013-06-28 LAB — COMPREHENSIVE METABOLIC PANEL
ALT: 24 U/L (ref 0–35)
AST: 23 U/L (ref 0–37)
Albumin: 3.9 g/dL (ref 3.5–5.2)
Alkaline Phosphatase: 78 U/L (ref 39–117)
BUN: 13 mg/dL (ref 6–23)
CALCIUM: 9.6 mg/dL (ref 8.4–10.5)
CO2: 27 meq/L (ref 19–32)
Chloride: 102 mEq/L (ref 96–112)
Creatinine, Ser: 0.8 mg/dL (ref 0.50–1.10)
GFR calc Af Amer: >90 mL/min (ref >90)
GFR calc non Af Amer: 79 mL/min — ABNORMAL LOW (ref >90)
Glucose, Bld: 111 mg/dL — ABNORMAL HIGH (ref 70–99)
Potassium: 4.1 mEq/L (ref 3.7–5.3)
SODIUM: 142 meq/L (ref 137–147)
TOTAL PROTEIN: 7.7 g/dL (ref 6.0–8.3)
Total Bilirubin: 0.3 mg/dL (ref 0.3–1.2)

## 2013-06-28 LAB — TROPONIN I: Troponin I: <0.3 ng/mL (ref <0.30)

## 2013-06-28 LAB — LIPASE, BLOOD: Lipase: 66 U/L — ABNORMAL HIGH (ref 11–59)

## 2013-06-28 MED ORDER — ASPIRIN 81 MG PO CHEW
324.0000 mg | CHEWABLE_TABLET | Freq: Once | ORAL | Status: AC
  Administered 2013-06-28: 324 mg via ORAL
  Filled 2013-06-28: qty 4

## 2013-06-28 MED ORDER — GI COCKTAIL ~~LOC~~
30.0000 mL | Freq: Once | ORAL | Status: AC
  Administered 2013-06-28: 30 mL via ORAL
  Filled 2013-06-28: qty 30

## 2013-06-28 MED ORDER — NITROGLYCERIN 0.4 MG SL SUBL
0.4000 mg | SUBLINGUAL_TABLET | SUBLINGUAL | Status: DC | PRN
  Administered 2013-06-28 (×2): 0.4 mg via SUBLINGUAL
  Filled 2013-06-28 (×2): qty 25

## 2013-06-28 NOTE — Discharge Instructions (Signed)
Chest Pain (Nonspecific) °It is often hard to give a specific diagnosis for the cause of chest pain. There is always a chance that your pain could be related to something serious, such as a heart attack or a blood clot in the lungs. You need to follow up with your caregiver for further evaluation. °CAUSES  °· Heartburn. °· Pneumonia or bronchitis. °· Anxiety or stress. °· Inflammation around your heart (pericarditis) or lung (pleuritis or pleurisy). °· A blood clot in the lung. °· A collapsed lung (pneumothorax). It can develop suddenly on its own (spontaneous pneumothorax) or from injury (trauma) to the chest. °· Shingles infection (herpes zoster virus). °The chest wall is composed of bones, muscles, and cartilage. Any of these can be the source of the pain. °· The bones can be bruised by injury. °· The muscles or cartilage can be strained by coughing or overwork. °· The cartilage can be affected by inflammation and become sore (costochondritis). °DIAGNOSIS  °Lab tests or other studies, such as X-rays, electrocardiography, stress testing, or cardiac imaging, may be needed to find the cause of your pain.  °TREATMENT  °· Treatment depends on what may be causing your chest pain. Treatment may include: °· Acid blockers for heartburn. °· Anti-inflammatory medicine. °· Pain medicine for inflammatory conditions. °· Antibiotics if an infection is present. °· You may be advised to change lifestyle habits. This includes stopping smoking and avoiding alcohol, caffeine, and chocolate. °· You may be advised to keep your head raised (elevated) when sleeping. This reduces the chance of acid going backward from your stomach into your esophagus. °· Most of the time, nonspecific chest pain will improve within 2 to 3 days with rest and mild pain medicine. °HOME CARE INSTRUCTIONS  °· If antibiotics were prescribed, take your antibiotics as directed. Finish them even if you start to feel better. °· For the next few days, avoid physical  activities that bring on chest pain. Continue physical activities as directed. °· Do not smoke. °· Avoid drinking alcohol. °· Only take over-the-counter or prescription medicine for pain, discomfort, or fever as directed by your caregiver. °· Follow your caregiver's suggestions for further testing if your chest pain does not go away. °· Keep any follow-up appointments you made. If you do not go to an appointment, you could develop lasting (chronic) problems with pain. If there is any problem keeping an appointment, you must call to reschedule. °SEEK MEDICAL CARE IF:  °· You think you are having problems from the medicine you are taking. Read your medicine instructions carefully. °· Your chest pain does not go away, even after treatment. °· You develop a rash with blisters on your chest. °SEEK IMMEDIATE MEDICAL CARE IF:  °· You have increased chest pain or pain that spreads to your arm, neck, jaw, back, or abdomen. °· You develop shortness of breath, an increasing cough, or you are coughing up blood. °· You have severe back or abdominal pain, feel nauseous, or vomit. °· You develop severe weakness, fainting, or chills. °· You have a fever. °THIS IS AN EMERGENCY. Do not wait to see if the pain will go away. Get medical help at once. Call your local emergency services (911 in [place] Do not drive yourself to the hospital. °MAKE SURE YOU:  °· Understand these instructions. °· Will watch your condition. °· Will get help right away if you are not doing well or get worse. °Document Released: 02/22/2005 Document Revised: 08/07/2011 Document Reviewed: 12/19/2007 °[employer]® Patient Information ©2014 [employer]   [employer]. ° °

## 2013-06-28 NOTE — ED Notes (Signed)
EKG being obtained at this time.

## 2013-06-28 NOTE — ED Notes (Signed)
Onset of intermittent chest pain x one week.  Diaphoresis today.  Denies SHOB.  Chest pain was in her left chest yesterday, upper abdominal area today.  Eating makes the pain worse, standing improves the pain.

## 2013-06-28 NOTE — ED Provider Notes (Signed)
CSN: [id number]     Arrival date & time 06/28/13  1645 History  This chart was scribed for Charles B. Karle Starch, MD by Roe Coombs, ED Scribe. The patient was seen in room MH05/MH05. Patient's care was started at 4:50 PM.      Chief Complaint  Patient presents with  . Chest Pain    The history is provided by the patient. No language interpreter was used.    HPI Comments: Margaret [name] is a 63 y.o. female who presents to the Emergency Department complaining of intermittent chest pain onset a few days ago. Pain is sometimes located at the lower sternum and other times in the left anterior chest. She states that today pain has become more severe and frequent. Currently, she rates pain as 6/10. Pain is worse while sitting down and while eating. Pain is improved with standing. She has not taken any medications today for pain relief. There is associated nausea. She denies vomiting, shortness of breath or other symptoms. She states that she was sick with cough and cold symptoms 1-2 weeks ago. She does not take aspirin. She has a history of GERD.   Past Medical History  Diagnosis Date  . Chest pain   . Hyperlipidemia   . Sleep apnea   . GERD (gastroesophageal reflux disease)   . Obesity   . At risk for colon cancer     Last colonoscopy in [place] 03/2009 found hyperplastic polyp only  . Osteopenia 08/31/2012  . Left shoulder pain 11/14/2012  . Unspecified sinusitis (chronic) 03/30/2013  . Allergic state 03/25/2012  . Preventative health care 03/30/2013   Past Surgical History  Procedure Laterality Date  . Dilation and curettage of uterus     Family History  Problem Relation Age of Onset  . Stroke Mother   . Lung cancer Father    History  Substance Use Topics  . Smoking status: Never Smoker   . Smokeless tobacco: Never Used  . Alcohol Use: No   OB History   Grav Para Term Preterm Abortions TAB SAB Ect Mult Living                 Review of Systems A complete 10 system review of systems  was obtained and all systems are negative except as noted in the HPI and PMH.   Allergies  Review of patient's allergies indicates no known allergies.  Home Medications   Current Outpatient Rx  Name  Route  Sig  Dispense  Refill  . amoxicillin-clavulanate (AUGMENTIN) 875-125 MG per tablet   Oral   Take 1 tablet by mouth 2 (two) times daily.   20 tablet   1   . cholecalciferol (VITAMIN D) 1000 UNITS tablet   Oral   Take 1,000 Units by mouth daily.         . fish oil-omega-3 fatty acids 1000 MG capsule   Oral   Take 2 g by mouth daily.         Marland Kitchen ibuprofen (ADVIL,MOTRIN) 400 MG tablet   Oral   Take 1 tablet (400 mg total) by mouth every 6 (six) hours as needed for pain.   30 tablet   0   . MULTIPLE VITAMINS PO   Oral   Take by mouth daily.         . Probiotic Product (ALIGN) 4 MG CAPS   Oral   Take by mouth daily.         . rosuvastatin (CRESTOR) 10 MG tablet  Oral   Take 1 tablet (10 mg total) by mouth daily.   30 tablet   3    Triage Vitals: BP 135/51  Pulse 57  Temp(Src) 97.6 F (36.4 C) (Oral)  Resp 16  SpO2 99% Physical Exam  Nursing note and vitals reviewed. Constitutional: She is oriented to person, place, and time. She appears well-developed and well-nourished.  HENT:  Head: Normocephalic and atraumatic.  Eyes: EOM are normal. Pupils are equal, round, and reactive to light.  Neck: Normal range of motion. Neck supple.  Cardiovascular: Normal rate, normal heart sounds and intact distal pulses.   Pulmonary/Chest: Effort normal and breath sounds normal.  Mild tenderness to lower sternum.  Abdominal: Bowel sounds are normal. She exhibits no distension. There is no tenderness. There is no rebound and no guarding.  Musculoskeletal: Normal range of motion. She exhibits no edema and no tenderness.  Neurological: She is alert and oriented to person, place, and time. She has normal strength. No cranial nerve deficit or sensory deficit.  Skin: Skin is  warm and dry. No rash noted.  Psychiatric: She has a normal mood and affect.    ED Course  Procedures (including critical care time) DIAGNOSTIC STUDIES: Oxygen Saturation is 99% on room air, normal by my interpretation.    COORDINATION OF CARE: 5:01 PM- Patient informed of current plan for treatment and evaluation and agrees with plan at this time.    Labs Review Labs Reviewed  CBC WITH DIFFERENTIAL - Abnormal; Notable for the following:    Neutrophils Relative % 40 (*)    Eosinophils Relative 7 (*)    All other components within normal limits  COMPREHENSIVE METABOLIC PANEL - Abnormal; Notable for the following:    Glucose, Bld 111 (*)    GFR calc non Af Amer 79 (*)    All other components within normal limits  LIPASE, BLOOD - Abnormal; Notable for the following:    Lipase 66 (*)    All other components within normal limits  TROPONIN I  TROPONIN I    Imaging Review Dg Chest 2 View  06/28/2013   CLINICAL DATA:  Chest pain  EXAM: CHEST  2 VIEW  COMPARISON:  DG CHEST 2 VIEW dated 03/28/2010  FINDINGS: The heart size and mediastinal contours are within normal limits. Both lungs are clear. The visualized skeletal structures are unremarkable.  IMPRESSION: No active cardiopulmonary disease.   Electronically Signed   By: Kathreen Devoid   On: 06/28/2013 18:01    EKG Interpretation    Date/Time:  Saturday June 28 2013 16:53:02 EST Ventricular Rate:  56 PR Interval:  156 QRS Duration: 90 QT Interval:  406 QTC Calculation: 391 R Axis:   63 Text Interpretation:  Sinus bradycardia Otherwise normal ECG No significant change since last tracing Confirmed by SHELDON  MD, CHARLES ([id number]) on 06/28/2013 5:23:59 PM            MDM   1. Atypical chest pain     Labs and imaging reviewed and unremarkable. Mild elevation in lipase unlikely cause of symptoms. Atypical pain ongoing since yesterday with normal enzymes. Doubt this is ACS. Recommend close PCP follow up for recheck. Return  for worsening.   I personally performed the services described in this documentation, which was scribed in my presence. The recorded information has been reviewed and is accurate.      Charles B. Karle Starch, MD 06/28/13 1027

## 2013-06-28 NOTE — ED Notes (Signed)
D/c home with family- no new rx given

## 2013-06-30 ENCOUNTER — Telehealth: Payer: Self-pay | Admitting: Family Medicine

## 2013-06-30 MED ORDER — ROSUVASTATIN CALCIUM 10 MG PO TABS: 10.0000 mg | ORAL_TABLET | Freq: Every day | ORAL | Status: DC

## 2013-06-30 NOTE — Telephone Encounter (Signed)
Refill-crestor 10mg  tablet. Take one tablet by mouth daily. Qty 30 last fill 12.4.14

## 2013-07-21 ENCOUNTER — Ambulatory Visit (INDEPENDENT_AMBULATORY_CARE_PROVIDER_SITE_OTHER): Payer: BC Managed Care – PPO | Admitting: Family Medicine

## 2013-07-21 ENCOUNTER — Encounter: Payer: Self-pay | Admitting: Family Medicine

## 2013-07-21 ENCOUNTER — Other Ambulatory Visit: Payer: Self-pay | Admitting: Family Medicine

## 2013-07-21 VITALS — BP 102/70 | HR 68 | Temp 97.9°F | Ht 62.0 in | Wt 168.1 lb

## 2013-07-21 DIAGNOSIS — R1013 Epigastric pain: Secondary | ICD-10-CM

## 2013-07-21 DIAGNOSIS — A048 Other specified bacterial intestinal infections: Secondary | ICD-10-CM

## 2013-07-21 DIAGNOSIS — G4730 Sleep apnea, unspecified: Secondary | ICD-10-CM

## 2013-07-21 DIAGNOSIS — R7309 Other abnormal glucose: Secondary | ICD-10-CM

## 2013-07-21 DIAGNOSIS — R739 Hyperglycemia, unspecified: Secondary | ICD-10-CM

## 2013-07-21 DIAGNOSIS — M25569 Pain in unspecified knee: Secondary | ICD-10-CM

## 2013-07-21 DIAGNOSIS — E785 Hyperlipidemia, unspecified: Secondary | ICD-10-CM

## 2013-07-21 DIAGNOSIS — R079 Chest pain, unspecified: Secondary | ICD-10-CM

## 2013-07-21 LAB — CBC
HCT: 38.7 % (ref 36.0–46.0)
HEMOGLOBIN: 13.3 g/dL (ref 12.0–15.0)
MCH: 29 pg (ref 26.0–34.0)
MCHC: 34.4 g/dL (ref 30.0–36.0)
MCV: 84.5 fL (ref 78.0–100.0)
Platelets: 252 10*3/uL (ref 150–400)
RBC: 4.58 MIL/uL (ref 3.87–5.11)
RDW: 14.4 % (ref 11.5–15.5)
WBC: 4.8 10*3/uL (ref 4.0–10.5)

## 2013-07-21 LAB — RENAL FUNCTION PANEL
Albumin: 4.4 g/dL (ref 3.5–5.2)
BUN: 14 mg/dL (ref 6–23)
CHLORIDE: 103 meq/L (ref 96–112)
CO2: 30 mEq/L (ref 19–32)
Calcium: 9.8 mg/dL (ref 8.4–10.5)
Creat: 0.64 mg/dL (ref 0.50–1.10)
Glucose, Bld: 98 mg/dL (ref 70–99)
Phosphorus: 3.2 mg/dL (ref 2.3–4.6)
Potassium: 4.5 mEq/L (ref 3.5–5.3)
SODIUM: 139 meq/L (ref 135–145)

## 2013-07-21 LAB — HEPATIC FUNCTION PANEL
ALK PHOS: 71 U/L (ref 39–117)
ALT: 26 U/L (ref 0–35)
AST: 23 U/L (ref 0–37)
Albumin: 4.4 g/dL (ref 3.5–5.2)
BILIRUBIN DIRECT: 0.1 mg/dL (ref 0.0–0.3)
BILIRUBIN TOTAL: 0.5 mg/dL (ref 0.2–1.2)
Indirect Bilirubin: 0.4 mg/dL (ref 0.2–1.2)
Total Protein: 7 g/dL (ref 6.0–8.3)

## 2013-07-21 LAB — HEMOGLOBIN A1C
HEMOGLOBIN A1C: 5.9 % — AB (ref <5.7)
MEAN PLASMA GLUCOSE: 123 mg/dL — AB (ref <117)

## 2013-07-21 LAB — LIPID PANEL
CHOLESTEROL: 186 mg/dL (ref 0–200)
HDL: 53 mg/dL (ref >39)
LDL Cholesterol: 117 mg/dL — ABNORMAL HIGH (ref 0–99)
Total CHOL/HDL Ratio: 3.5 Ratio
Triglycerides: 81 mg/dL (ref <150)
VLDL: 16 mg/dL (ref 0–40)

## 2013-07-21 LAB — LIPASE: Lipase: 30 U/L (ref 0–75)

## 2013-07-21 MED ORDER — ASPIRIN EC 81 MG PO TBEC: 81.0000 mg | DELAYED_RELEASE_TABLET | Freq: Every day | ORAL | Status: DC

## 2013-07-21 NOTE — Patient Instructions (Signed)
Acute Pancreatitis °Acute pancreatitis is a disease in which the pancreas becomes suddenly inflamed. The pancreas is a large gland located behind your stomach. The pancreas produces enzymes that help digest food. The pancreas also releases the hormones glucagon and insulin that help regulate blood sugar. Damage to the pancreas occurs when the digestive enzymes from the pancreas are activated and begin attacking the pancreas before being released into the intestine. Most acute attacks last a couple of days and can cause serious complications. Some people become dehydrated and develop low blood pressure. In severe cases, bleeding into the pancreas can lead to shock and can be life-threatening. The lungs, heart, and kidneys may fail. °CAUSES  °Pancreatitis can happen to anyone. In some cases, the cause is unknown. Most cases are caused by: °· Alcohol abuse. °· Gallstones. °Other less common causes are: °· Certain medicines. °· Exposure to certain chemicals. °· Infection. °· Damage caused by an accident (trauma). °· Abdominal surgery. °SYMPTOMS  °· Pain in the upper abdomen that may radiate to the back. °· Tenderness and swelling of the abdomen. °· Nausea and vomiting. °DIAGNOSIS  °Your caregiver will perform a physical exam. Blood and stool tests may be done to confirm the diagnosis. Imaging tests may also be done, such as X-rays, CT scans, or an ultrasound of the abdomen. °TREATMENT  °Treatment usually requires a stay in the hospital. Treatment may include: °· Pain medicine. °· Fluid replacement through an intravenous line (IV). °· Placing a tube in the stomach to remove stomach contents and control vomiting. °· Not eating for 3 or 4 days. This gives your pancreas a rest, because enzymes are not being produced that can cause further damage. °· Antibiotic medicines if your condition is caused by an infection. °· Surgery of the pancreas or gallbladder. °HOME CARE INSTRUCTIONS  °· Follow the diet advised by your  caregiver. This may involve avoiding alcohol and decreasing the amount of fat in your diet. °· Eat smaller, more frequent meals. This reduces the amount of digestive juices the pancreas produces. °· Drink enough fluids to keep your urine clear or pale yellow. °· Only take over-the-counter or prescription medicines as directed by your caregiver. °· Avoid drinking alcohol if it caused your condition. °· Do not smoke. °· Get plenty of rest. °· Check your blood sugar at home as directed by your caregiver. °· Keep all follow-up appointments as directed by your caregiver. °SEEK MEDICAL CARE IF:  °· You do not recover as quickly as expected. °· You develop new or worsening symptoms. °· You have persistent pain, weakness, or nausea. °· You recover and then have another episode of pain. °SEEK IMMEDIATE MEDICAL CARE IF:  °· You are unable to eat or keep fluids down. °· Your pain becomes severe. °· You have a fever or persistent symptoms for more than 2 to 3 days. °· You have a fever and your symptoms suddenly get worse. °· Your skin or the white part of your eyes turn yellow (jaundice). °· You develop vomiting. °· You feel dizzy, or you faint. °· Your blood sugar is high (over 300 mg/dL). °MAKE SURE YOU:  °· Understand these instructions. °· Will watch your condition. °· Will get help right away if you are not doing well or get worse. °Document Released: 05/15/2005 Document Revised: 11/14/2011 Document Reviewed: 08/24/2011 °[employer]® Patient Information ©2014 [employer]. ° °

## 2013-07-21 NOTE — Progress Notes (Signed)
Pre visit review using our clinic review tool, if applicable. No additional management support is needed unless otherwise documented below in the visit note. 

## 2013-07-22 LAB — H. PYLORI ANTIBODY, IGG: H PYLORI IGG: 2.26 {ISR} — AB

## 2013-07-22 LAB — TSH: TSH: 1.513 u[IU]/mL (ref 0.350–4.500)

## 2013-07-22 MED ORDER — OMEPRAZOLE 20 MG PO CPDR: 20.0000 mg | DELAYED_RELEASE_CAPSULE | Freq: Two times a day (BID) | ORAL | Status: DC

## 2013-07-22 MED ORDER — CLARITHROMYCIN 500 MG PO TABS: 500.0000 mg | ORAL_TABLET | Freq: Two times a day (BID) | ORAL | Status: DC

## 2013-07-22 MED ORDER — AMOXICILLIN 500 MG PO CAPS: 1000.0000 mg | ORAL_CAPSULE | Freq: Two times a day (BID) | ORAL | Status: DC

## 2013-07-27 ENCOUNTER — Encounter: Payer: Self-pay | Admitting: Family Medicine

## 2013-07-27 DIAGNOSIS — R079 Chest pain, unspecified: Secondary | ICD-10-CM | POA: Insufficient documentation

## 2013-07-27 DIAGNOSIS — R739 Hyperglycemia, unspecified: Secondary | ICD-10-CM | POA: Insufficient documentation

## 2013-07-27 NOTE — Assessment & Plan Note (Signed)
Atypical and likely related to H Pylori, does have numerous risk factors for cardiac disease unfortunately. Will refer to cardiology for consideration of cardiac stress testing.

## 2013-07-27 NOTE — Assessment & Plan Note (Signed)
Tolerating Crestor, no changes

## 2013-07-27 NOTE — Assessment & Plan Note (Signed)
Just returned from a trip to the [place] with significant GI symptoms and atypical chest pain, testing revealed H Pylori will treat and reassess

## 2013-07-27 NOTE — Progress Notes (Signed)
Patient ID: Margaret [name], female   DOB: [birth year], 63 y.o.   MRN: [id number] Margaret [name] [id number] [birth year] 07/27/2013      Progress Note-Follow Up  Subjective  Chief Complaint  Chief Complaint  Patient presents with  . Follow-up    4 month    HPI  Patient is a 63 year old female who is just returned from a trip to the [place] visiting family. She had a good trip but upon returning began having some chest pain she describes as a pressure. She also has had some epigastric pain with nausea and diaphoresis. It is worse after spicy food and improves with Maalox. No fevers or chills. No constipation or diarrhea. No fevers, chills malaise or myalgias. No symptoms in the office today.  Past Medical History  Diagnosis Date  . Chest pain   . Hyperlipidemia   . Sleep apnea   . GERD (gastroesophageal reflux disease)   . Obesity   . At risk for colon cancer     Last colonoscopy in [place] 03/2009 found hyperplastic polyp only  . Osteopenia 08/31/2012  . Left shoulder pain 11/14/2012  . Unspecified sinusitis (chronic) 03/30/2013  . Allergic state 03/25/2012  . Preventative health care 03/30/2013    Past Surgical History  Procedure Laterality Date  . Dilation and curettage of uterus      Family History  Problem Relation Age of Onset  . Stroke Mother   . Lung cancer Father     History   Social History  . Marital Status: Married    Spouse Name: N/A    Number of Children: 2  . Years of Education: N/A   Occupational History  . Accountant    Social History Main Topics  . Smoking status: Never Smoker   . Smokeless tobacco: Never Used  . Alcohol Use: No  . Drug Use: No  . Sexual Activity: Not on file   Other Topics Concern  . Not on file   Social History Narrative  . No narrative on file    Current Outpatient Prescriptions on File Prior to Visit  Medication Sig Dispense Refill  . fish oil-omega-3 fatty acids 1000 MG capsule Take 2 g by mouth daily.      .  MULTIPLE VITAMINS PO Take by mouth daily.      . Probiotic Product (ALIGN) 4 MG CAPS Take by mouth daily.      . rosuvastatin (CRESTOR) 10 MG tablet Take 1 tablet (10 mg total) by mouth daily.  30 tablet  1  . cholecalciferol (VITAMIN D) 1000 UNITS tablet Take 1,000 Units by mouth daily.       No current facility-administered medications on file prior to visit.    No Known Allergies  Review of Systems  Review of Systems  Constitutional: Negative for fever and malaise/fatigue.  HENT: Negative for congestion.   Eyes: Negative for discharge.  Respiratory: Positive for shortness of breath.   Cardiovascular: Positive for chest pain. Negative for palpitations and leg swelling.  Gastrointestinal: Positive for nausea and abdominal pain. Negative for diarrhea.  Genitourinary: Negative for dysuria.  Musculoskeletal: Negative for falls.  Skin: Negative for rash.  Neurological: Negative for loss of consciousness and headaches.  Endo/Heme/Allergies: Negative for polydipsia.  Psychiatric/Behavioral: Negative for depression and suicidal ideas. The patient is not nervous/anxious and does not have insomnia.     Objective  BP 102/70  Pulse 68  Temp(Src) 97.9 F (36.6 C) (Oral)  Ht 5\' 2"  (1.575 m)  Wt 168 lb 1.3  oz (76.241 kg)  BMI 30.73 kg/m2  SpO2 95%  Physical Exam  Physical Exam  Constitutional: She is oriented to person, place, and time and well-developed, well-nourished, and in no distress. No distress.  HENT:  Head: Normocephalic and atraumatic.  Eyes: Conjunctivae are normal.  Neck: Neck supple. No thyromegaly present.  Cardiovascular: Normal rate, regular rhythm and normal heart sounds.   No murmur heard. Pulmonary/Chest: Effort normal and breath sounds normal. She has no wheezes.  Abdominal: She exhibits no distension and no mass.  Musculoskeletal: She exhibits no edema.  Lymphadenopathy:    She has no cervical adenopathy.  Neurological: She is alert and oriented to  person, place, and time.  Skin: Skin is warm and dry. No rash noted. She is not diaphoretic.  Psychiatric: Memory, affect and judgment normal.    Lab Results  Component Value Date   TSH 1.513 07/21/2013   Lab Results  Component Value Date   WBC 4.8 07/21/2013   HGB 13.3 07/21/2013   HCT 38.7 07/21/2013   MCV 84.5 07/21/2013   PLT 252 07/21/2013   Lab Results  Component Value Date   CREATININE 0.64 07/21/2013   BUN 14 07/21/2013   NA 139 07/21/2013   K 4.5 07/21/2013   CL 103 07/21/2013   CO2 30 07/21/2013   Lab Results  Component Value Date   ALT 26 07/21/2013   AST 23 07/21/2013   ALKPHOS 71 07/21/2013   BILITOT 0.5 07/21/2013   Lab Results  Component Value Date   CHOL 186 07/21/2013   Lab Results  Component Value Date   HDL 53 07/21/2013   Lab Results  Component Value Date   LDLCALC 117* 07/21/2013   Lab Results  Component Value Date   TRIG 81 07/21/2013   Lab Results  Component Value Date   CHOLHDL 3.5 07/21/2013     Assessment & Plan  HELICOBACTER PYLORI INFECTION Just returned from a trip to the [place] with significant GI symptoms and atypical chest pain, testing revealed H Pylori will treat and reassess  SLEEP APNEA compliant  HYPERLIPIDEMIA Tolerating Crestor, no changes  Hyperglycemia Minimize simple carbs, Hgba1c acceptable on last check  Chest pain Atypical and likely related to H Pylori, does have numerous risk factors for cardiac disease unfortunately. Will refer to cardiology for consideration of cardiac stress testing.

## 2013-07-27 NOTE — Assessment & Plan Note (Signed)
Minimize simple carbs, Hgba1c acceptable on last check

## 2013-07-27 NOTE — Assessment & Plan Note (Signed)
compliant

## 2013-08-01 ENCOUNTER — Encounter: Payer: Self-pay | Admitting: Family Medicine

## 2013-08-11 ENCOUNTER — Institutional Professional Consult (permissible substitution): Payer: BC Managed Care – PPO | Admitting: Cardiology

## 2013-09-12 ENCOUNTER — Encounter: Payer: Self-pay | Admitting: Cardiology

## 2013-09-12 ENCOUNTER — Ambulatory Visit (INDEPENDENT_AMBULATORY_CARE_PROVIDER_SITE_OTHER): Payer: BC Managed Care – PPO | Admitting: Cardiology

## 2013-09-12 VITALS — BP 130/72 | HR 72 | Ht 62.0 in | Wt 171.8 lb

## 2013-09-12 DIAGNOSIS — E785 Hyperlipidemia, unspecified: Secondary | ICD-10-CM

## 2013-09-12 DIAGNOSIS — R079 Chest pain, unspecified: Secondary | ICD-10-CM

## 2013-09-12 DIAGNOSIS — K219 Gastro-esophageal reflux disease without esophagitis: Secondary | ICD-10-CM

## 2013-09-12 NOTE — Progress Notes (Signed)
[facility]. [address], [address], [place]  [address] Phone: [phone] Fax:  [phone]  Date:  09/12/2013   ID:  Margaret [name], DOB 08-18-1950, MRN [id number]  PCP:  Penni Homans, MD   History of Present Illness: Margaret [name] is a 63 y.o. female here for evaluation of chest pain. Has hyperlipidemia, sleep apnea, GERD. Pain central off and on comes with eating. Can be present the whole day. No SOB, but heaviness on chest. Feels it in her throat. Pain in neck and back, ear. Younger sister had stroke. Mother died of stroke. Her pain does not necessarily appear to be exertional. Moderate in severity. She has been to the emergency room in the past. She had stress test several years ago.   Wt Readings from Last 3 Encounters:  09/12/13 171 lb 12.8 oz (77.928 kg)  07/21/13 168 lb 1.3 oz (76.241 kg)  04/22/13 175 lb (79.379 kg)     Past Medical History  Diagnosis Date  . Chest pain   . Hyperlipidemia   . Sleep apnea   . GERD (gastroesophageal reflux disease)   . Obesity   . At risk for colon cancer     Last colonoscopy in [place] 03/2009 found hyperplastic polyp only  . Osteopenia 08/31/2012  . Left shoulder pain 11/14/2012  . Unspecified sinusitis (chronic) 03/30/2013  . Allergic state 03/25/2012  . Preventative health care 03/30/2013    Past Surgical History  Procedure Laterality Date  . Dilation and curettage of uterus      Current Outpatient Prescriptions  Medication Sig Dispense Refill  . amoxicillin (AMOXIL) 500 MG capsule Take 2 capsules (1,000 mg total) by mouth 2 (two) times daily. X 10 days  40 capsule  0  . aspirin EC 81 MG tablet Take 1 tablet (81 mg total) by mouth daily.      . fish oil-omega-3 fatty acids 1000 MG capsule Take 2 g by mouth daily.      . MULTIPLE VITAMINS PO Take by mouth daily.      Marland Kitchen omeprazole (PRILOSEC) 20 MG capsule Take 1 capsule (20 mg total) by mouth 2 (two) times daily. X 10 days  20 capsule  0  . rosuvastatin (CRESTOR) 10 MG  tablet Take 1 tablet (10 mg total) by mouth daily.  30 tablet  1   No current facility-administered medications for this visit.    Allergies:   No Known Allergies  Social History:  The patient  reports that she has never smoked. She has never used smokeless tobacco. She reports that she does not drink alcohol or use illicit drugs. husband works for [employer]. She is an Optometrist. Has children that live in [place]. She is from [place].  Family History  Problem Relation Age of Onset  . Stroke Mother   . Lung cancer Father     ROS:  Please see the history of present illness.   Denies any syncope, bleeding, orthopnea, PND.   All other systems reviewed and negative.   PHYSICAL EXAM: VS:  BP 130/72  Pulse 72  Ht 5\' 2"  (1.575 m)  Wt 171 lb 12.8 oz (77.928 kg)  BMI 31.41 kg/m2 Well nourished, well developed, in no acute distress HEENT: normal, [place]/AT, EOMI Neck: no JVD, normal carotid upstroke, no bruit Cardiac:  normal S1, S2; RRR; no murmur Lungs:  clear to auscultation bilaterally, no wheezing, rhonchi or rales Abd: soft, nontender, no hepatomegaly, no bruitsOverweight Ext: no edema,  2+ distal pulses Skin: warm and dry GU: deferred Neuro: no focal abnormalities noted, AAO x 3  EKG: 06/30/13- Sinus bradycardia, 56, no other significant abnormalities Labs: 07/21/13-creatinine 0.6, LDL 117, troponin normal, chest x-ray personally viewed shows no airspace disease  ASSESSMENT AND PLAN:  1. Chest pain-presentation likely consistent with GERD however given her multiple risk factors including family history of her sister and mother both having stroke, hyperlipidemia, [place] descent, we will go ahead and check a nuclear stress test. Aggressive primary prevention. We discussed at length. 2. Hyperlipidemia-excellent use of Crestor. 3. We will follow up with stress test.  Signed, Candee Furbish, MD [facility]  09/12/2013 3:28 PM

## 2013-09-12 NOTE — Patient Instructions (Addendum)
Your physician recommends that you continue on your current medications as directed. Please refer to the Current Medication list given to you today.  Your physician has requested that you have en exercise stress myoview. For further information please visit [email or web address]. Please follow instruction sheet, as given.  Your physician recommends that you schedule a follow-up as needed.

## 2013-09-22 ENCOUNTER — Encounter: Payer: Self-pay | Admitting: Family Medicine

## 2013-09-22 ENCOUNTER — Telehealth: Payer: Self-pay | Admitting: Family Medicine

## 2013-09-22 ENCOUNTER — Ambulatory Visit (INDEPENDENT_AMBULATORY_CARE_PROVIDER_SITE_OTHER): Payer: BC Managed Care – PPO | Admitting: Family Medicine

## 2013-09-22 VITALS — BP 110/70 | HR 87 | Temp 98.4°F | Ht 62.0 in | Wt 169.0 lb

## 2013-09-22 DIAGNOSIS — T7840XA Allergy, unspecified, initial encounter: Secondary | ICD-10-CM

## 2013-09-22 DIAGNOSIS — R7309 Other abnormal glucose: Secondary | ICD-10-CM

## 2013-09-22 DIAGNOSIS — K219 Gastro-esophageal reflux disease without esophagitis: Secondary | ICD-10-CM

## 2013-09-22 DIAGNOSIS — E785 Hyperlipidemia, unspecified: Secondary | ICD-10-CM

## 2013-09-22 DIAGNOSIS — Z0000 Encounter for general adult medical examination without abnormal findings: Secondary | ICD-10-CM

## 2013-09-22 DIAGNOSIS — G4730 Sleep apnea, unspecified: Secondary | ICD-10-CM

## 2013-09-22 DIAGNOSIS — A048 Other specified bacterial intestinal infections: Secondary | ICD-10-CM

## 2013-09-22 DIAGNOSIS — R739 Hyperglycemia, unspecified: Secondary | ICD-10-CM

## 2013-09-22 MED ORDER — RANITIDINE HCL 300 MG PO TABS: 300.0000 mg | ORAL_TABLET | Freq: Every evening | ORAL | Status: DC | PRN

## 2013-09-22 MED ORDER — CETIRIZINE HCL 10 MG PO TABS: 10.0000 mg | ORAL_TABLET | Freq: Every day | ORAL | Status: DC | PRN

## 2013-09-22 NOTE — Progress Notes (Signed)
Pre visit review using our clinic review tool, if applicable. No additional management support is needed unless otherwise documented below in the visit note. 

## 2013-09-22 NOTE — Assessment & Plan Note (Signed)
Tolerating statin, encouraged heart healthy diet, avoid trans fats, minimize simple carbs and saturated fats. Increase exercise as tolerated 

## 2013-09-22 NOTE — Assessment & Plan Note (Signed)
Using CPAP routinely 

## 2013-09-22 NOTE — Assessment & Plan Note (Signed)
Doing well. Avoid offending foods, start probiotics. Do not eat large meals in late evening and consider raising head of bed.

## 2013-09-22 NOTE — Patient Instructions (Addendum)
Nasal saline flushes to nose twice daily  Cholesterol Cholesterol is a white, waxy, fat-like protein needed by your body in small amounts. The liver makes all the cholesterol you need. It is carried from the liver by the blood through the blood vessels. Deposits (plaque) may build up on blood vessel walls. This makes the arteries narrower and stiffer. Plaque increases the risk for heart attack and stroke. You cannot feel your cholesterol level even if it is very high. The only way to know is by a blood test to check your lipid (fats) levels. Once you know your cholesterol levels, you should keep a record of the test results. Work with your caregiver to to keep your levels in the desired range. WHAT THE RESULTS MEAN:  Total cholesterol is a rough measure of all the cholesterol in your blood.  LDL is the so-called bad cholesterol. This is the type that deposits cholesterol in the walls of the arteries. You want this level to be low.  HDL is the good cholesterol because it cleans the arteries and carries the LDL away. You want this level to be high.  Triglycerides are fat that the body can either burn for energy or store. High levels are closely linked to heart disease. DESIRED LEVELS:  Total cholesterol below 200.  LDL below 100 for people at risk, below 70 for very high risk.  HDL above 50 is good, above 60 is best.  Triglycerides below 150. HOW TO LOWER YOUR CHOLESTEROL:  Diet.  Choose fish or white meat chicken and [place], roasted or baked. Limit fatty cuts of red meat, fried foods, and processed meats, such as sausage and lunch meat.  Eat lots of fresh fruits and vegetables. Choose whole grains, beans, pasta, potatoes and cereals.  Use only small amounts of olive, corn or canola oils. Avoid butter, mayonnaise, shortening or palm kernel oils. Avoid foods with trans-fats.  Use skim/nonfat milk and low-fat/nonfat yogurt and cheeses. Avoid whole milk, cream, ice cream, egg yolks and  cheeses. Healthy desserts include angel food cake, ginger snaps, animal crackers, hard candy, popsicles, and low-fat/nonfat frozen yogurt. Avoid pastries, cakes, pies and cookies.  Exercise.  A regular program helps decrease LDL and raises HDL.  Helps with weight control.  Do things that increase your activity level like gardening, walking, or taking the stairs.  Medication.  May be prescribed by your caregiver to help lowering cholesterol and the risk for heart disease.  You may need medicine even if your levels are normal if you have several risk factors. HOME CARE INSTRUCTIONS   Follow your diet and exercise programs as suggested by your caregiver.  Take medications as directed.  Have blood work done when your caregiver feels it is necessary. MAKE SURE YOU:   Understand these instructions.  Will watch your condition.  Will get help right away if you are not doing well or get worse. Document Released: 02/07/2001 Document Revised: 08/07/2011 Document Reviewed: 02/26/2013 [facility] Patient Information 2014 [place], [place].

## 2013-09-22 NOTE — Telephone Encounter (Signed)
Return in about 4 months (around 01/22/2014) for lipid, renal, cbc, tsh, hepatic, hgba1c prior, annual exam.

## 2013-09-22 NOTE — Progress Notes (Signed)
Patient ID: Margaret [name], female   DOB: Nov 02, 1950, 63 y.o.   MRN: [id number] Mei Suits [id number] 09/06/1950 09/22/2013      Progress Note-Follow Up  Subjective  Chief Complaint  Chief Complaint  Patient presents with  . Follow-up    2 month    HPI  Patient is a 63 year old 8 in today for routine medical care. She is doing well. Her stomach is greatly improved. She does occasionally have some bloating and dyspepsia still but it is significantly better. No recent fevers. No recent illness.Denies CP/palp/SOB/HA/congestion/fevers/GI or GU c/o. Taking meds as prescribed  Past Medical History  Diagnosis Date  . Chest pain   . Hyperlipidemia   . Sleep apnea   . GERD (gastroesophageal reflux disease)   . Obesity   . At risk for colon cancer     Last colonoscopy in [place] 03/2009 found hyperplastic polyp only  . Osteopenia 08/31/2012  . Left shoulder pain 11/14/2012  . Unspecified sinusitis (chronic) 03/30/2013  . Allergic state 03/25/2012  . Preventative health care 03/30/2013    Past Surgical History  Procedure Laterality Date  . Dilation and curettage of uterus      Family History  Problem Relation Age of Onset  . Stroke Mother   . Lung cancer Father     History   Social History  . Marital Status: Married    Spouse Name: N/A    Number of Children: 2  . Years of Education: N/A   Occupational History  . Accountant    Social History Main Topics  . Smoking status: Never Smoker   . Smokeless tobacco: Never Used  . Alcohol Use: No  . Drug Use: No  . Sexual Activity: Not on file   Other Topics Concern  . Not on file   Social History Narrative  . No narrative on file    Current Outpatient Prescriptions on File Prior to Visit  Medication Sig Dispense Refill  . amoxicillin (AMOXIL) 500 MG capsule Take 2 capsules (1,000 mg total) by mouth 2 (two) times daily. X 10 days  40 capsule  0  . aspirin EC 81 MG tablet Take 1 tablet (81 mg total) by mouth daily.       . fish oil-omega-3 fatty acids 1000 MG capsule Take 2 g by mouth daily.      . MULTIPLE VITAMINS PO Take by mouth daily.      Marland Kitchen omeprazole (PRILOSEC) 20 MG capsule Take 1 capsule (20 mg total) by mouth 2 (two) times daily. X 10 days  20 capsule  0  . rosuvastatin (CRESTOR) 10 MG tablet Take 1 tablet (10 mg total) by mouth daily.  30 tablet  1   No current facility-administered medications on file prior to visit.    No Known Allergies  Review of Systems  Review of Systems  Constitutional: Negative for fever and malaise/fatigue.  HENT: Negative for congestion.   Eyes: Negative for discharge.  Respiratory: Negative for shortness of breath.   Cardiovascular: Negative for chest pain, palpitations and leg swelling.  Gastrointestinal: Negative for nausea, abdominal pain and diarrhea.  Genitourinary: Negative for dysuria.  Musculoskeletal: Negative for falls.  Skin: Negative for rash.  Neurological: Negative for loss of consciousness and headaches.  Endo/Heme/Allergies: Negative for polydipsia.  Psychiatric/Behavioral: Negative for depression and suicidal ideas. The patient is not nervous/anxious and does not have insomnia.     Objective  BP 110/70  Pulse 87  Temp(Src) 98.4 F (36.9 C) (Oral)  Ht 5\' 2"  (1.575 m)  Wt 169 lb 0.6 oz (76.676 kg)  BMI 30.91 kg/m2  SpO2 95%  Physical Exam  Physical Exam  Constitutional: She is oriented to person, place, and time and well-developed, well-nourished, and in no distress. No distress.  HENT:  Head: Normocephalic and atraumatic.  Eyes: Conjunctivae are normal.  Neck: Neck supple. No thyromegaly present.  Cardiovascular: Normal rate, regular rhythm and normal heart sounds.   No murmur heard. Pulmonary/Chest: Effort normal and breath sounds normal. She has no wheezes.  Abdominal: She exhibits no distension and no mass.  Musculoskeletal: She exhibits no edema.  Lymphadenopathy:    She has no cervical adenopathy.  Neurological: She is  alert and oriented to person, place, and time.  Skin: Skin is warm and dry. No rash noted. She is not diaphoretic.  Psychiatric: Memory, affect and judgment normal.    Lab Results  Component Value Date   TSH 1.513 07/21/2013   Lab Results  Component Value Date   WBC 4.8 07/21/2013   HGB 13.3 07/21/2013   HCT 38.7 07/21/2013   MCV 84.5 07/21/2013   PLT 252 07/21/2013   Lab Results  Component Value Date   CREATININE 0.64 07/21/2013   BUN 14 07/21/2013   NA 139 07/21/2013   K 4.5 07/21/2013   CL 103 07/21/2013   CO2 30 07/21/2013   Lab Results  Component Value Date   ALT 26 07/21/2013   AST 23 07/21/2013   ALKPHOS 71 07/21/2013   BILITOT 0.5 07/21/2013   Lab Results  Component Value Date   CHOL 186 07/21/2013   Lab Results  Component Value Date   HDL 53 07/21/2013   Lab Results  Component Value Date   LDLCALC 117* 07/21/2013   Lab Results  Component Value Date   TRIG 81 07/21/2013   Lab Results  Component Value Date   CHOLHDL 3.5 07/21/2013     Assessment & Plan   HELICOBACTER PYLORI INFECTION Doing well. Avoid offending foods, start probiotics. Do not eat large meals in late evening and consider raising head of bed.   HYPERLIPIDEMIA Tolerating statin, encouraged heart healthy diet, avoid trans fats, minimize simple carbs and saturated fats. Increase exercise as tolerated  SLEEP APNEA Using CPAP routinely

## 2013-09-24 ENCOUNTER — Encounter: Payer: Self-pay | Admitting: Cardiovascular Disease

## 2013-09-29 ENCOUNTER — Encounter (HOSPITAL_COMMUNITY): Payer: BC Managed Care – PPO | Attending: Cardiovascular Disease | Admitting: Radiology

## 2013-09-29 VITALS — BP 138/71 | Ht 62.0 in | Wt 169.0 lb

## 2013-09-29 DIAGNOSIS — R079 Chest pain, unspecified: Secondary | ICD-10-CM | POA: Insufficient documentation

## 2013-09-29 DIAGNOSIS — I4949 Other premature depolarization: Secondary | ICD-10-CM

## 2013-09-29 MED ORDER — TECHNETIUM TC 99M SESTAMIBI GENERIC - CARDIOLITE
11.0000 | Freq: Once | INTRAVENOUS | Status: AC | PRN
  Administered 2013-09-29: 11 via INTRAVENOUS

## 2013-09-29 MED ORDER — TECHNETIUM TC 99M SESTAMIBI GENERIC - CARDIOLITE
33.0000 | Freq: Once | INTRAVENOUS | Status: AC | PRN
  Administered 2013-09-29: 33 via INTRAVENOUS

## 2013-09-29 NOTE — Progress Notes (Signed)
[facility] [address] [place], [place] [address] [phone]    Cardiology Nuclear Med Study  Margaret [name] is a 63 y.o. female     MRN : [id number]     DOB: 1951-04-02  Procedure Date: 09/29/2013  Nuclear Med Background Indication for Stress Test:  Evaluation for Ischemia, and hospital on 05-2013 for chest pain, enzymes negative History:  '01 Stress Echo: normal and EF=60% Cardiac Risk Factors: Lipids  Symptoms:  Chest Pain/heaviness and neck (last date of chest discomfort 2 mos)   Nuclear Pre-Procedure Caffeine/Decaff Intake:  None > 12 hrs NPO After: 9:00pm   Lungs:  clear O2 Sat: 97% on room air. IV 0.9% NS with Angio Cath:  22g  IV Site: R Antecubital x 1, tolerated well IV Started by:  Irven Baltimore, RN  Chest Size (in):  36 Cup Size: C  Height: 5\' 2"  (1.575 m)  Weight:  169 lb (76.658 kg)  BMI:  Body mass index is 30.9 kg/(m^2). Tech Comments:  N/A    Nuclear Med Study 1 or 2 day study: 1 day  Stress Test Type:  Stress  Reading MD: N/A  Order Authorizing Provider:  Candee Furbish, MD  Resting Radionuclide: Technetium 19m Sestamibi  Resting Radionuclide Dose: 11.0 mCi   Stress Radionuclide:  Technetium 67m Sestamibi  Stress Radionuclide Dose: 33.0 mCi           Stress Protocol Rest HR: 60 Stress HR: 157  Rest BP: 138/71 Stress BP: 167/68  Exercise Time (min): 7:00 METS: 8.2           Dose of Adenosine (mg):  n/a Dose of Lexiscan: n/a mg  Dose of Atropine (mg): n/a Dose of Dobutamine: n/a mcg/kg/min (at max HR)  Stress Test Technologist: Matilde Haymaker, RN  Nuclear Technologist:  Charlton Amor, CNMT     Rest Procedure:  Myocardial perfusion imaging was performed at rest 45 minutes following the intravenous administration of Technetium 49m Sestamibi. Rest ECG: NSR - Normal EKG  Stress Procedure:  The patient exercised on the treadmill utilizing the Bruce Protocol for 7:00 minutes. The patient stopped due to fatigue and  denied any chest pain.  Technetium 89m Sestamibi was injected at peak exercise and myocardial perfusion imaging was performed after a brief delay. Stress ECG: Insignificant upsloping ST segment depression.  QPS Raw Data Images:  Normal; no motion artifact; normal heart/lung ratio. Stress Images:  Normal homogeneous uptake in all areas of the myocardium. Rest Images:  Normal homogeneous uptake in all areas of the myocardium. Subtraction (SDS):  No evidence of ischemia. Transient Ischemic Dilatation (Normal <1.22):  0.91 Lung/Heart Ratio (Normal <0.45):  0.29  Quantitative Gated Spect Images QGS EDV:  75 ml QGS ESV:  25 ml  Impression Exercise Capacity:  Good exercise capacity. BP Response:  Normal blood pressure response. Clinical Symptoms:  No chest pain. ECG Impression:  Insignificant upsloping ST segment depression. Comparison with Prior Nuclear Study: No previous nuclear study performed  Overall Impression:  Low risk stress nuclear study. No evidence of ischemia or scar. Good exercise tolerance.  LV Ejection Fraction: 66%.  LV Wall Motion:  NL LV Function; NL Wall Motion   Darlin Coco MD

## 2013-11-05 ENCOUNTER — Telehealth: Payer: Self-pay | Admitting: *Deleted

## 2013-11-05 NOTE — Telephone Encounter (Signed)
She is not using CPAP at all  Can we help? Should we d/c machine

## 2013-11-06 NOTE — Telephone Encounter (Signed)
I called spoke with pt. She is asking to speak with RA personally. She did not want to come in for ROV.  She can be reached at [phone]. Please advise Dr. Elsworth Soho thanks

## 2013-11-12 ENCOUNTER — Emergency Department (HOSPITAL_BASED_OUTPATIENT_CLINIC_OR_DEPARTMENT_OTHER)
Admission: EM | Admit: 2013-11-12 | Discharge: 2013-11-12 | Disposition: A | Discharge status: Home / Self Care | Payer: BC Managed Care – PPO | Attending: Emergency Medicine | Admitting: Emergency Medicine

## 2013-11-12 ENCOUNTER — Encounter (HOSPITAL_BASED_OUTPATIENT_CLINIC_OR_DEPARTMENT_OTHER): Payer: Self-pay | Admitting: Emergency Medicine

## 2013-11-12 ENCOUNTER — Emergency Department (HOSPITAL_BASED_OUTPATIENT_CLINIC_OR_DEPARTMENT_OTHER): Payer: BC Managed Care – PPO

## 2013-11-12 DIAGNOSIS — E785 Hyperlipidemia, unspecified: Secondary | ICD-10-CM | POA: Insufficient documentation

## 2013-11-12 DIAGNOSIS — E669 Obesity, unspecified: Secondary | ICD-10-CM | POA: Insufficient documentation

## 2013-11-12 DIAGNOSIS — K219 Gastro-esophageal reflux disease without esophagitis: Secondary | ICD-10-CM

## 2013-11-12 DIAGNOSIS — Z79899 Other long term (current) drug therapy: Secondary | ICD-10-CM | POA: Insufficient documentation

## 2013-11-12 DIAGNOSIS — R519 Headache, unspecified: Secondary | ICD-10-CM

## 2013-11-12 DIAGNOSIS — R51 Headache: Secondary | ICD-10-CM | POA: Insufficient documentation

## 2013-11-12 DIAGNOSIS — Z7982 Long term (current) use of aspirin: Secondary | ICD-10-CM | POA: Insufficient documentation

## 2013-11-12 DIAGNOSIS — I10 Essential (primary) hypertension: Secondary | ICD-10-CM | POA: Insufficient documentation

## 2013-11-12 DIAGNOSIS — M5412 Radiculopathy, cervical region: Secondary | ICD-10-CM | POA: Insufficient documentation

## 2013-11-12 DIAGNOSIS — R350 Frequency of micturition: Secondary | ICD-10-CM | POA: Insufficient documentation

## 2013-11-12 DIAGNOSIS — Z8709 Personal history of other diseases of the respiratory system: Secondary | ICD-10-CM | POA: Insufficient documentation

## 2013-11-12 DIAGNOSIS — Z85038 Personal history of other malignant neoplasm of large intestine: Secondary | ICD-10-CM | POA: Insufficient documentation

## 2013-11-12 LAB — COMPREHENSIVE METABOLIC PANEL
ALT: 20 U/L (ref 0–35)
AST: 18 U/L (ref 0–37)
Albumin: 3.9 g/dL (ref 3.5–5.2)
Alkaline Phosphatase: 90 U/L (ref 39–117)
BUN: 13 mg/dL (ref 6–23)
CALCIUM: 9.6 mg/dL (ref 8.4–10.5)
CO2: 27 mEq/L (ref 19–32)
CREATININE: 0.7 mg/dL (ref 0.50–1.10)
Chloride: 106 mEq/L (ref 96–112)
GFR calc Af Amer: >90 mL/min (ref >90)
GFR calc non Af Amer: >90 mL/min (ref >90)
Glucose, Bld: 108 mg/dL — ABNORMAL HIGH (ref 70–99)
Potassium: 4.1 mEq/L (ref 3.7–5.3)
SODIUM: 144 meq/L (ref 137–147)
TOTAL PROTEIN: 7.7 g/dL (ref 6.0–8.3)
Total Bilirubin: 0.3 mg/dL (ref 0.3–1.2)

## 2013-11-12 LAB — CBC WITH DIFFERENTIAL/PLATELET
Basophils Absolute: 0 10*3/uL (ref 0.0–0.1)
Basophils Relative: 1 % (ref 0–1)
EOS ABS: 0.4 10*3/uL (ref 0.0–0.7)
Eosinophils Relative: 7 % — ABNORMAL HIGH (ref 0–5)
HCT: 39.8 % (ref 36.0–46.0)
HEMOGLOBIN: 13.2 g/dL (ref 12.0–15.0)
LYMPHS ABS: 2.3 10*3/uL (ref 0.7–4.0)
Lymphocytes Relative: 39 % (ref 12–46)
MCH: 29 pg (ref 26.0–34.0)
MCHC: 33.2 g/dL (ref 30.0–36.0)
MCV: 87.5 fL (ref 78.0–100.0)
Monocytes Absolute: 0.6 10*3/uL (ref 0.1–1.0)
Monocytes Relative: 10 % (ref 3–12)
NEUTROS ABS: 2.5 10*3/uL (ref 1.7–7.7)
NEUTROS PCT: 43 % (ref 43–77)
PLATELETS: 210 10*3/uL (ref 150–400)
RBC: 4.55 MIL/uL (ref 3.87–5.11)
RDW: 13.6 % (ref 11.5–15.5)
WBC: 5.8 10*3/uL (ref 4.0–10.5)

## 2013-11-12 LAB — URINALYSIS, ROUTINE W REFLEX MICROSCOPIC
BILIRUBIN URINE: NEGATIVE
Glucose, UA: NEGATIVE mg/dL
Hgb urine dipstick: NEGATIVE
Ketones, ur: NEGATIVE mg/dL
Leukocytes, UA: NEGATIVE
NITRITE: NEGATIVE
PH: 6.5 (ref 5.0–8.0)
Protein, ur: NEGATIVE mg/dL
SPECIFIC GRAVITY, URINE: 1.003 — AB (ref 1.005–1.030)
UROBILINOGEN UA: 0.2 mg/dL (ref 0.0–1.0)

## 2013-11-12 LAB — TROPONIN I

## 2013-11-12 MED ORDER — PANTOPRAZOLE SODIUM 20 MG PO TBEC: 20.0000 mg | DELAYED_RELEASE_TABLET | Freq: Every day | ORAL | Status: DC

## 2013-11-12 MED ORDER — TRAMADOL HCL 50 MG PO TABS: 50.0000 mg | ORAL_TABLET | Freq: Four times a day (QID) | ORAL | Status: DC | PRN

## 2013-11-12 MED ORDER — CYCLOBENZAPRINE HCL 10 MG PO TABS: 10.0000 mg | ORAL_TABLET | Freq: Two times a day (BID) | ORAL | Status: DC | PRN

## 2013-11-12 NOTE — Discharge Instructions (Signed)
Cervical Radiculopathy Cervical radiculopathy means a nerve in the neck is pinched or bruised. This can cause pain or loss of feeling (numbness) that runs from your neck to your arm and fingers. HOME CARE   Put ice on the injured or painful area.  Put ice in a plastic bag.  Place a towel between your skin and the bag.  Leave the ice on for 15-20 minutes, 03-04 times a day, or as told by your doctor.  If ice does not help, you can try using heat. Take a warm shower or bath, or use a hot water bottle as told by your doctor.  You may try a gentle neck and shoulder massage.  Use a flat pillow when you sleep.  Only take medicines as told by your doctor.  Keep all physical therapy visits as told by your doctor.  If you are given a soft collar, wear it as told by your doctor. GET HELP RIGHT AWAY IF:   Your pain gets worse and is not controlled with medicine.  You lose feeling or feel weak in your hand, arm, face, or leg.  You have a fever or stiff neck.  You cannot control when you poop or pee (incontinence).  You have trouble with walking, balance, or speaking. MAKE SURE YOU:   Understand these instructions.  Will watch your condition.  Will get help right away if you are not doing well or get worse. Document Released: 05/04/2011 Document Revised: 08/07/2011 Document Reviewed: 05/04/2011 [facility] Patient Information 2014 [place], [place].  Gastroesophageal Reflux Disease, Adult Gastroesophageal reflux disease (GERD) happens when acid from your stomach flows up into the esophagus. When acid comes in contact with the esophagus, the acid causes soreness (inflammation) in the esophagus. Over time, GERD may create small holes (ulcers) in the lining of the esophagus. CAUSES   Increased body weight. This puts pressure on the stomach, making acid rise from the stomach into the esophagus.  Smoking. This increases acid production in the stomach.  Drinking alcohol. This causes  decreased pressure in the lower esophageal sphincter (valve or ring of muscle between the esophagus and stomach), allowing acid from the stomach into the esophagus.  Late evening meals and a full stomach. This increases pressure and acid production in the stomach.  A malformed lower esophageal sphincter. Sometimes, no cause is found. SYMPTOMS   Burning pain in the lower part of the mid-chest behind the breastbone and in the mid-stomach area. This may occur twice a week or more often.  Trouble swallowing.  Sore throat.  Dry cough.  Asthma-like symptoms including chest tightness, shortness of breath, or wheezing. DIAGNOSIS  Your caregiver may be able to diagnose GERD based on your symptoms. In some cases, X-rays and other tests may be done to check for complications or to check the condition of your stomach and esophagus. TREATMENT  Your caregiver may recommend over-the-counter or prescription medicines to help decrease acid production. Ask your caregiver before starting or adding any new medicines.  HOME CARE INSTRUCTIONS   Change the factors that you can control. Ask your caregiver for guidance concerning weight loss, quitting smoking, and alcohol consumption.  Avoid foods and drinks that make your symptoms worse, such as:  Caffeine or alcoholic drinks.  Chocolate.  Peppermint or mint flavorings.  Garlic and onions.  Spicy foods.  Citrus fruits, such as oranges, lemons, or limes.  Tomato-based foods such as sauce, chili, salsa, and pizza.  Fried and fatty foods.  Avoid lying down for the  3 hours prior to your bedtime or prior to taking a nap.  Eat small, frequent meals instead of large meals.  Wear loose-fitting clothing. Do not wear anything tight around your waist that causes pressure on your stomach.  Raise the head of your bed 6 to 8 inches with wood blocks to help you sleep. Extra pillows will not help.  Only take over-the-counter or prescription medicines for  pain, discomfort, or fever as directed by your caregiver.  Do not take aspirin, ibuprofen, or other nonsteroidal anti-inflammatory drugs (NSAIDs). SEEK IMMEDIATE MEDICAL CARE IF:   You have pain in your arms, neck, jaw, teeth, or back.  Your pain increases or changes in intensity or duration.  You develop nausea, vomiting, or sweating (diaphoresis).  You develop shortness of breath, or you faint.  Your vomit is green, yellow, black, or looks like coffee grounds or blood.  Your stool is red, bloody, or black. These symptoms could be signs of other problems, such as heart disease, gastric bleeding, or esophageal bleeding. MAKE SURE YOU:   Understand these instructions.  Will watch your condition.  Will get help right away if you are not doing well or get worse. Document Released: 02/22/2005 Document Revised: 08/07/2011 Document Reviewed: 12/02/2010 [facility] Patient Information 2015 [place], [place]. This information is not intended to replace advice given to you by your health care provider. Make sure you discuss any questions you have with your health care provider.  General Headache Without Cause A general headache is pain or discomfort felt around the head or neck area. The cause may not be found.  HOME CARE   Keep all doctor visits.  Only take medicines as told by your doctor.  Lie down in a dark, quiet room when you have a headache.  Keep a journal to find out if certain things bring on headaches. For example, write down:  What you eat and drink.  How much sleep you get.  Any change to your diet or medicines.  Relax by getting a massage or doing other relaxing activities.  Put ice or heat packs on the head and neck area as told by your doctor.  Lessen stress.  Sit up straight. Do not tighten (tense) your muscles.  Quit smoking if you smoke.  Lessen how much alcohol you drink.  Lessen how much caffeine you drink, or stop drinking caffeine.  Eat and sleep on  a regular schedule.  Get 7 to 9 hours of sleep, or as told by your doctor.  Keep lights dim if bright lights bother you or make your headaches worse. GET HELP RIGHT AWAY IF:   Your headache becomes really bad.  You have a fever.  You have a stiff neck.  You have trouble seeing.  Your muscles are weak, or you lose muscle control.  You lose your balance or have trouble walking.  You feel like you will pass out (faint), or you pass out.  You have really bad symptoms that are different than your first symptoms.  You have problems with the medicines given to you by your doctor.  Your medicines do not work.  Your headache feels different than the other headaches.  You feel sick to your stomach (nauseous) or throw up (vomit). MAKE SURE YOU:   Understand these instructions.  Will watch your condition.  Will get help right away if you are not doing well or get worse. Document Released: 02/22/2008 Document Revised: 08/07/2011 Document Reviewed: 05/05/2011 [facility] Patient Information 2015 [place], [place]. This  information is not intended to replace advice given to you by your health care provider. Make sure you discuss any questions you have with your health care provider.  Heartburn Heartburn is a painful, burning sensation in the chest. It may feel worse in certain positions, such as lying down or bending over. It is caused by stomach acid backing up into the tube that carries food from the mouth down to the stomach (lower esophagus).  CAUSES   Large meals.  Certain foods and drinks.  Exercise.  Increased acid production.  Being overweight or obese.  Certain medicines. SYMPTOMS   Burning pain in the chest or lower throat.  Bitter taste in the mouth.  Coughing. DIAGNOSIS  If the usual treatments for heartburn do not improve your symptoms, then tests may be done to see if there is another condition present. Possible tests may include:  X-rays.  Endoscopy. This  is when a tube with a light and a camera on the end is used to examine the esophagus and the stomach.  A test to measure the amount of acid in the esophagus (pH test).  A test to see if the esophagus is working properly (esophageal manometry).  Blood, breath, or stool tests to check for bacteria that cause ulcers. TREATMENT   Your caregiver may tell you to use certain over-the-counter medicines (antacids, acid reducers) for mild heartburn.  Your caregiver may prescribe medicines to decrease the acid in your stomach or protect your stomach lining.  Your caregiver may recommend certain diet changes.  For severe cases, your caregiver may recommend that the head of your bed be elevated on blocks. (Sleeping with more pillows is not an effective treatment as it only changes the position of your head and does not improve the main problem of stomach acid refluxing into the esophagus.) HOME CARE INSTRUCTIONS   Take all medicines as directed by your caregiver.  Raise the head of your bed by putting blocks under the legs if instructed to by your caregiver.  Do not exercise right after eating.  Avoid eating 2 or 3 hours before bed. Do not lie down right after eating.  Eat small meals throughout the day instead of 3 large meals.  Stop smoking if you smoke.  Maintain a healthy weight.  Identify foods and beverages that make your symptoms worse and avoid them. Foods you may want to avoid include:  Peppers.  Chocolate.  High-fat foods, including fried foods.  Spicy foods.  Garlic and onions.  Citrus fruits, including oranges, grapefruit, lemons, and limes.  Food containing tomatoes or tomato products.  Mint.  Carbonated drinks, caffeinated drinks, and alcohol.  Vinegar. SEEK IMMEDIATE MEDICAL CARE IF:  You have severe chest pain that goes down your arm or into your jaw or neck.  You feel sweaty, dizzy, or lightheaded.  You are short of breath.  You vomit blood.  You  have difficulty or pain with swallowing.  You have bloody or black, tarry stools.  You have episodes of heartburn more than 3 times a week for more than 2 weeks. MAKE SURE YOU:  Understand these instructions.  Will watch your condition.  Will get help right away if you are not doing well or get worse. Document Released: 10/01/2008 Document Revised: 08/07/2011 Document Reviewed: 10/30/2010 [facility] Patient Information 2015 [place], [place]. This information is not intended to replace advice given to you by your health care provider. Make sure you discuss any questions you have with your health care provider.

## 2013-11-12 NOTE — ED Provider Notes (Signed)
CSN: [id number]     Arrival date & time 11/12/13  0815 History   First MD Initiated Contact with Patient 11/12/13 [phone]     Chief Complaint  Patient presents with  . high blood pressure   . Urinary Frequency     (Consider location/radiation/quality/duration/timing/severity/associated sxs/prior Treatment) HPI Comments: Patient presents to the ER for evaluation of elevated blood pressure and headache. Patient reports that she has been experiencing frontal headache as well as pain in the left side of her neck radiating to the left shoulder. This has been ongoing for approximately 2 days. She has been having symptoms of indigestion. Patient has previously been diagnosed with acid reflux and has been using her Zantac but it doesn't seem to be helping as it usually does. There is no shortness of breath associated with the symptoms. Patient has noticed some urinary frequency.  Patient is a 63 y.o. female presenting with frequency.  Urinary Frequency Associated symptoms include headaches. Pertinent negatives include no shortness of breath.    Past Medical History  Diagnosis Date  . Chest pain   . Hyperlipidemia   . Sleep apnea   . GERD (gastroesophageal reflux disease)   . Obesity   . At risk for colon cancer     Last colonoscopy in [place] 03/2009 found hyperplastic polyp only  . Osteopenia 08/31/2012  . Left shoulder pain 11/14/2012  . Unspecified sinusitis (chronic) 03/30/2013  . Allergic state 03/25/2012  . Preventative health care 03/30/2013   Past Surgical History  Procedure Laterality Date  . Dilation and curettage of uterus     Family History  Problem Relation Age of Onset  . Stroke Mother   . Lung cancer Father    History  Substance Use Topics  . Smoking status: Never Smoker   . Smokeless tobacco: Never Used  . Alcohol Use: No   OB History   Grav Para Term Preterm Abortions TAB SAB Ect Mult Living                 Review of Systems  Respiratory: Negative for shortness of  breath.   Gastrointestinal:       Reflux  Genitourinary: Positive for frequency.  Musculoskeletal: Positive for neck pain.  Neurological: Positive for headaches.  All other systems reviewed and are negative.     Allergies  Review of patient's allergies indicates no known allergies.  Home Medications   Prior to Admission medications   Medication Sig Start Date End Date Taking? Authorizing Provider  aspirin EC 81 MG tablet Take 1 tablet (81 mg total) by mouth daily. 07/21/13  Yes Mosie Lukes, MD  cetirizine (ZYRTEC) 10 MG tablet Take 1 tablet (10 mg total) by mouth daily as needed for allergies. 09/22/13  Yes Mosie Lukes, MD  fish oil-omega-3 fatty acids 1000 MG capsule Take 2 g by mouth daily.   Yes Historical Provider, MD  MULTIPLE VITAMINS PO Take by mouth daily.   Yes Historical Provider, MD  ranitidine (ZANTAC) 300 MG tablet Take 1 tablet (300 mg total) by mouth at bedtime as needed for heartburn. 09/22/13  Yes Mosie Lukes, MD  rosuvastatin (CRESTOR) 10 MG tablet Take 1 tablet (10 mg total) by mouth daily. 06/30/13  Yes Mosie Lukes, MD   BP 154/55  Pulse 73  Temp(Src) 98.3 F (36.8 C) (Oral)  Resp 14  SpO2 97% Physical Exam  Constitutional: She is oriented to person, place, and time. She appears well-developed and well-nourished. No distress.  HENT:  Head: Normocephalic and atraumatic.  Right Ear: Hearing normal.  Left Ear: Hearing normal.  Nose: Nose normal.  Mouth/Throat: Oropharynx is clear and moist and mucous membranes are normal.  Eyes: Conjunctivae and EOM are normal. Pupils are equal, round, and reactive to light.  Neck: Normal range of motion. Neck supple. Muscular tenderness present. No spinous process tenderness present. No Brudzinski's sign and no Kernig's sign noted.    Cardiovascular: Regular rhythm, S1 normal and S2 normal.  Exam reveals no gallop and no friction rub.   No murmur heard. Pulmonary/Chest: Effort normal and breath sounds normal. No  respiratory distress. She exhibits no tenderness.  Abdominal: Soft. Normal appearance and bowel sounds are normal. There is no hepatosplenomegaly. There is no tenderness. There is no rebound, no guarding, no tenderness at McBurney's point and negative Murphy's sign. No hernia.  Musculoskeletal: Normal range of motion.  Neurological: She is alert and oriented to person, place, and time. She has normal strength. No cranial nerve deficit or sensory deficit. Coordination normal. GCS eye subscore is 4. GCS verbal subscore is 5. GCS motor subscore is 6.  Normal strength in bilateral upper extremities. No objective sensory or motor deficit  Skin: Skin is warm, dry and intact. No rash noted. No cyanosis.  Psychiatric: She has a normal mood and affect. Her speech is normal and behavior is normal. Thought content normal.    ED Course  Procedures (including critical care time) Labs Review Labs Reviewed  URINALYSIS, ROUTINE W REFLEX MICROSCOPIC - Abnormal; Notable for the following:    Specific Gravity, Urine 1.003 (*)    All other components within normal limits  CBC WITH DIFFERENTIAL - Abnormal; Notable for the following:    Eosinophils Relative 7 (*)    All other components within normal limits  COMPREHENSIVE METABOLIC PANEL - Abnormal; Notable for the following:    Glucose, Bld 108 (*)    All other components within normal limits  TROPONIN I    Imaging Review Ct Head Wo Contrast  11/12/2013   CLINICAL DATA:  High blood pressure  EXAM: CT HEAD WITHOUT CONTRAST  TECHNIQUE: Contiguous axial images were obtained from the base of the skull through the vertex without intravenous contrast.  COMPARISON:  None.  FINDINGS: No acute intracranial hemorrhage. No focal mass lesion. No CT evidence of acute infarction. No midline shift or mass effect. No hydrocephalus. Basilar cisterns are patent. Paranasal sinuses and mastoid air cells are clear.  IMPRESSION: Normal head CT   Electronically Signed   By: Suzy Bouchard M.D.   On: 11/12/2013 08:59     EKG Interpretation   Date/Time:  Wednesday November 12 2013 08:40:39 EDT Ventricular Rate:  58 PR Interval:  160 QRS Duration: 88 QT Interval:  422 QTC Calculation: 414 R Axis:   56 Text Interpretation:  Sinus bradycardia Otherwise normal ECG Confirmed by  POLLINA  MD, CHRISTOPHER [phone]) on 11/12/2013 9:35:16 AM      MDM   Final diagnoses:  Headache  Cervical radicular pain  GERD (gastroesophageal reflux disease)  Hypertension    The patient presents to the ER with multiple complaints. Patient is complaining of headache. Pain is mostly bifrontal in nature, mild to moderate. It was not rapid in onset and she has normal neurologic function. Very low suspicion for a subarachnoid hemorrhage or other intracranial abnormality. CT head was normal.  Patient complaining of pain on the left side of her neck radiating into the shoulder. She does have some soft tissue and paraspinal  muscle tenderness in the region, difficult to rule out cervical radiculopathy, but once again no functional deficit. Patient has equal strength and sensation bilateral upper extremities.  She has been experiencing some urinary frequency. Urinalysis is normal. Serum glucose was normal as well.  Patient complaining of reflux symptoms. She has a previously diagnosed gastroesophageal reflux disease, currently taking ranitidine. Nothing unusual about the current symptoms. She does have tenderness in the right upper quadrant her concern for gallbladder disease. Very atypical for cardiac etiology. EKG unremarkable, troponin negative.  Patient was very hypertensive upon arrival. I suspect that there is some anxiety and whitecoat syndrome. She has improved without intervention. Most recent blood pressure 141/63.    Orpah Greek, MD 11/12/13 1500

## 2013-11-27 ENCOUNTER — Encounter: Payer: Self-pay | Admitting: Pulmonary Disease

## 2013-11-27 ENCOUNTER — Ambulatory Visit (INDEPENDENT_AMBULATORY_CARE_PROVIDER_SITE_OTHER): Payer: BC Managed Care – PPO | Admitting: Pulmonary Disease

## 2013-11-27 VITALS — BP 124/78 | HR 70 | Ht 62.0 in | Wt 171.1 lb

## 2013-11-27 DIAGNOSIS — G4730 Sleep apnea, unspecified: Secondary | ICD-10-CM

## 2013-11-27 NOTE — Patient Instructions (Signed)
Rx for new nasal mask will be sent to new DME Download will be checked in 1-2 months If this shows good usage of CPAP, we will request new unit

## 2013-11-27 NOTE — Assessment & Plan Note (Addendum)
She seems ready to restart CPAP given her a recurrence of symptoms Rx for new nasal mask will be sent to new DME Download will be checked in 1-2 months If this shows good usage of CPAP, we will request new unit  Weight loss encouraged, compliance with goal of at least 4-6 hrs every night is the expectation. Advised against medications with sedative side effects Cautioned against driving when sleepy - understanding that sleepiness will vary on a day to day basis

## 2013-11-27 NOTE — Progress Notes (Signed)
   Subjective:    Patient ID: Margaret [name], female    DOB: 1951-04-11, 63 y.o.   MRN: [id number]  HPI  59/F, Filipino woman, never smoker, for management of obstructive sleep apnea.  She moved from [place] In 2010  PSG 1/09 >> AHI 49/h, lowest desatn 71%, , corrected by CPAP 9 cm, placed on auto CPAP with nasal pillows     11/27/2013  Chief Complaint  Patient presents with  . Follow-up    Pt reports she has not worn CPAP x  1 1/2 months. She reports her mask was worn out and does not work properly. [facility] would not give new mask d/t non compliance w/ CPAP. She is not happy with [facility]ria.    She has not used her CPAP for the last 7 months. Her DME would not provide her new CPAP supplies due to poor compliance. Her husband complained about her snoring and she is ready to start again .She feels that her current mask is worn out. However she would like to change DME wonders if she needs new cpap - current one x 24yrs  Wt stable She reports increased phlegm production in a.m.  Review of Systems neg for any significant sore throat, dysphagia, itching, sneezing, nasal congestion or excess/ purulent secretions, fever, chills, sweats, unintended wt loss, pleuritic or exertional cp, hempoptysis, orthopnea pnd or change in chronic leg swelling. Also denies presyncope, palpitations, heartburn, abdominal pain, nausea, vomiting, diarrhea or change in bowel or urinary habits, dysuria,hematuria, rash, arthralgias, visual complaints, headache, numbness weakness or ataxia.     Objective:   Physical Exam  Gen. Pleasant, obese, in no distress ENT - no lesions, no post nasal drip Neck: No JVD, no thyromegaly, no carotid bruits Lungs: no use of accessory muscles, no dullness to percussion, decreased without rales or rhonchi  Cardiovascular: Rhythm regular, heart sounds  normal, no murmurs or gallops, no peripheral edema Musculoskeletal: No deformities, no cyanosis or clubbing , no tremors          Assessment & Plan:

## 2013-11-30 ENCOUNTER — Other Ambulatory Visit: Payer: Self-pay | Admitting: Family Medicine

## 2013-12-25 ENCOUNTER — Telehealth: Payer: Self-pay | Admitting: Family Medicine

## 2013-12-25 ENCOUNTER — Telehealth: Payer: Self-pay

## 2013-12-25 NOTE — Telephone Encounter (Signed)
I will not be able to call today, see if she is willing to tell us what concerns symptoms she has. If just concerned about the strength of the med. She can drop to every other day taking the Protonix and see how she does. We can also stop the Protonix and switch her to a different kind of stomach tab like Zantac. It is a little milder. Can take 150 mg po daily and if no increased heartburn can drop to every other day. She is more than welcome to come in and discuss further. Remind her to take a probiotic daily such as Align, [place] or Digestive Advantage

## 2013-12-25 NOTE — Telephone Encounter (Signed)
Error

## 2013-12-25 NOTE — Telephone Encounter (Signed)
Pt request to speak with MD regarding meds for gerd. Thinks maybe to strong.

## 2013-12-26 ENCOUNTER — Encounter: Payer: Self-pay | Admitting: Family Medicine

## 2013-12-26 ENCOUNTER — Other Ambulatory Visit: Payer: Self-pay

## 2013-12-26 ENCOUNTER — Ambulatory Visit (INDEPENDENT_AMBULATORY_CARE_PROVIDER_SITE_OTHER): Payer: BC Managed Care – PPO | Admitting: Family Medicine

## 2013-12-26 VITALS — BP 124/76 | HR 66 | Temp 98.7°F | Wt 171.0 lb

## 2013-12-26 DIAGNOSIS — T7840XD Allergy, unspecified, subsequent encounter: Secondary | ICD-10-CM

## 2013-12-26 DIAGNOSIS — K2100 Gastro-esophageal reflux disease with esophagitis, without bleeding: Secondary | ICD-10-CM

## 2013-12-26 DIAGNOSIS — A048 Other specified bacterial intestinal infections: Secondary | ICD-10-CM

## 2013-12-26 DIAGNOSIS — J0110 Acute frontal sinusitis, unspecified: Secondary | ICD-10-CM

## 2013-12-26 MED ORDER — RANITIDINE HCL 300 MG PO TABS: 300.0000 mg | ORAL_TABLET | Freq: Every day | ORAL | Status: DC

## 2013-12-26 MED ORDER — CEFDINIR 300 MG PO CAPS: 300.0000 mg | ORAL_CAPSULE | Freq: Two times a day (BID) | ORAL | Status: AC

## 2013-12-26 NOTE — Patient Instructions (Signed)

## 2013-12-26 NOTE — Progress Notes (Signed)
Patient ID: Margaret [name], female   DOB: 1950-08-07, 63 y.o.   MRN: [id number] Margaret [name] [id number] 01-04-1951 12/26/2013      Progress Note-Follow Up  Subjective  Chief Complaint  No chief complaint on file.   HPI  Patient is a 63year old female in today for routine medical care. In today concerned about persistent head congestion, pnd and a sense of choking in her throat most notably in am, not with eating. Has some mild headache and ear discomfort. Notes no f/c but has a poor appetite. She struggles with PND cough is productive of yellow phlegm. Denies CP/palp/SOB/HA/congestion/fevers or GU c/o. Taking meds as prescribed  Past Medical History  Diagnosis Date  . Chest pain   . Hyperlipidemia   . Sleep apnea   . GERD (gastroesophageal reflux disease)   . Obesity   . At risk for colon cancer     Last colonoscopy in [place] 03/2009 found hyperplastic polyp only  . Osteopenia 08/31/2012  . Left shoulder pain 11/14/2012  . Unspecified sinusitis (chronic) 03/30/2013  . Allergic state 03/25/2012  . Preventative health care 03/30/2013    Past Surgical History  Procedure Laterality Date  . Dilation and curettage of uterus      Family History  Problem Relation Age of Onset  . Stroke Mother   . Lung cancer Father     History   Social History  . Marital Status: Married    Spouse Name: N/A    Number of Children: 2  . Years of Education: N/A   Occupational History  . Accountant    Social History Main Topics  . Smoking status: Never Smoker   . Smokeless tobacco: Never Used  . Alcohol Use: No  . Drug Use: No  . Sexual Activity: Not on file   Other Topics Concern  . Not on file   Social History Narrative  . No narrative on file    Current Outpatient Prescriptions on File Prior to Visit  Medication Sig Dispense Refill  . aspirin EC 81 MG tablet Take 81 mg by mouth every other day.      . cetirizine (ZYRTEC) 10 MG tablet Take 1 tablet (10 mg total) by mouth daily as  needed for allergies.  30 tablet  5  . CRESTOR 10 MG tablet TAKE 1 TABLET (10 MG TOTAL) BY MOUTH DAILY.  30 tablet  1  . fish oil-omega-3 fatty acids 1000 MG capsule Take 2 g by mouth daily.      . MULTIPLE VITAMINS PO Take by mouth daily.      . pantoprazole (PROTONIX) 20 MG tablet Take 1 tablet (20 mg total) by mouth daily.  30 tablet  2  . ranitidine (ZANTAC) 300 MG tablet        No current facility-administered medications on file prior to visit.    No Known Allergies  Review of Systems  Review of Systems  Constitutional: Negative for fever and malaise/fatigue.  HENT: Negative for congestion.   Eyes: Negative for discharge.  Respiratory: Negative for shortness of breath.   Cardiovascular: Negative for chest pain, palpitations and leg swelling.  Gastrointestinal: Negative for nausea, abdominal pain and diarrhea.  Genitourinary: Negative for dysuria.  Musculoskeletal: Negative for falls.  Skin: Negative for rash.  Neurological: Negative for loss of consciousness and headaches.  Endo/Heme/Allergies: Negative for polydipsia.  Psychiatric/Behavioral: Negative for depression and suicidal ideas. The patient is not nervous/anxious and does not have insomnia.     Objective  BP 124/76  Pulse 66  Temp(Src) 98.7 F (37.1 C) (Oral)  Wt 171 lb (77.565 kg)  SpO2 94%  Physical Exam  Physical Exam  Constitutional: She is oriented to person, place, and time and well-developed, well-nourished, and in no distress. No distress.  HENT:  Head: Normocephalic and atraumatic.  Eyes: Conjunctivae are normal.  Neck: Neck supple. No thyromegaly present.  Cardiovascular: Normal rate, regular rhythm and normal heart sounds.   No murmur heard. Pulmonary/Chest: Effort normal and breath sounds normal. She has no wheezes.  Abdominal: She exhibits no distension and no mass.  Musculoskeletal: She exhibits no edema.  Lymphadenopathy:    She has no cervical adenopathy.  Neurological: She is alert  and oriented to person, place, and time.  Skin: Skin is warm and dry. No rash noted. She is not diaphoretic.  Psychiatric: Memory, affect and judgment normal.    Lab Results  Component Value Date   TSH 1.513 07/21/2013   Lab Results  Component Value Date   WBC 5.8 11/12/2013   HGB 13.2 11/12/2013   HCT 39.8 11/12/2013   MCV 87.5 11/12/2013   PLT 210 11/12/2013   Lab Results  Component Value Date   CREATININE 0.70 11/12/2013   BUN 13 11/12/2013   NA 144 11/12/2013   K 4.1 11/12/2013   CL 106 11/12/2013   CO2 27 11/12/2013   Lab Results  Component Value Date   ALT 20 11/12/2013   AST 18 11/12/2013   ALKPHOS 90 11/12/2013   BILITOT 0.3 11/12/2013   Lab Results  Component Value Date   CHOL 186 07/21/2013   Lab Results  Component Value Date   HDL 53 07/21/2013   Lab Results  Component Value Date   LDLCALC 117* 07/21/2013   Lab Results  Component Value Date   TRIG 81 07/21/2013   Lab Results  Component Value Date   CHOLHDL 3.5 07/21/2013     Assessment & Plan  Acute sinusitis Encouraged increased rest and hydration, add probiotics, zinc such as Coldeze or Xicam. Treat fevers as needed. Given rx for Cefdinir  GERD Has not been using Zantac routinely, will restart, continue Protonix, Avoid offending foods, start probiotics. Do not eat large meals in late evening and consider raising head of bed.   HELICOBACTER PYLORI INFECTION May need to be retested for cure if symptoms worsen  Allergic state Recommend Cetirizine daily, nasal saline prn

## 2013-12-26 NOTE — Telephone Encounter (Signed)
Called pt and advised on below. Pt states that she has a lot of questions and concerns about medication and would like an appointment. Pt coming at 2:45 pm.

## 2013-12-26 NOTE — Progress Notes (Signed)
Pre visit review using our clinic review tool, if applicable. No additional management support is needed unless otherwise documented below in the visit note. 

## 2013-12-29 ENCOUNTER — Encounter: Payer: Self-pay | Admitting: Family Medicine

## 2013-12-29 NOTE — Assessment & Plan Note (Signed)
Has not been using Zantac routinely, will restart, continue Protonix, Avoid offending foods, start probiotics. Do not eat large meals in late evening and consider raising head of bed.

## 2013-12-29 NOTE — Assessment & Plan Note (Signed)
May need to be retested for cure if symptoms worsen

## 2013-12-29 NOTE — Assessment & Plan Note (Signed)
Recommend Cetirizine daily, nasal saline prn

## 2013-12-29 NOTE — Assessment & Plan Note (Signed)
Encouraged increased rest and hydration, add probiotics, zinc such as Coldeze or Xicam. Treat fevers as needed. Given rx for Cefdinir

## 2014-01-12 ENCOUNTER — Emergency Department (HOSPITAL_BASED_OUTPATIENT_CLINIC_OR_DEPARTMENT_OTHER)
Admission: EM | Admit: 2014-01-12 | Discharge: 2014-01-13 | Disposition: A | Discharge status: Home / Self Care | Payer: BC Managed Care – PPO | Attending: Emergency Medicine | Admitting: Emergency Medicine

## 2014-01-12 ENCOUNTER — Emergency Department (HOSPITAL_BASED_OUTPATIENT_CLINIC_OR_DEPARTMENT_OTHER): Payer: BC Managed Care – PPO

## 2014-01-12 DIAGNOSIS — Z85038 Personal history of other malignant neoplasm of large intestine: Secondary | ICD-10-CM | POA: Diagnosis not present

## 2014-01-12 DIAGNOSIS — R1084 Generalized abdominal pain: Secondary | ICD-10-CM

## 2014-01-12 DIAGNOSIS — E669 Obesity, unspecified: Secondary | ICD-10-CM | POA: Insufficient documentation

## 2014-01-12 DIAGNOSIS — Z8709 Personal history of other diseases of the respiratory system: Secondary | ICD-10-CM | POA: Insufficient documentation

## 2014-01-12 DIAGNOSIS — M549 Dorsalgia, unspecified: Secondary | ICD-10-CM | POA: Diagnosis not present

## 2014-01-12 DIAGNOSIS — K219 Gastro-esophageal reflux disease without esophagitis: Secondary | ICD-10-CM | POA: Diagnosis not present

## 2014-01-12 DIAGNOSIS — R109 Unspecified abdominal pain: Secondary | ICD-10-CM | POA: Diagnosis present

## 2014-01-12 DIAGNOSIS — Z7982 Long term (current) use of aspirin: Secondary | ICD-10-CM | POA: Diagnosis not present

## 2014-01-12 DIAGNOSIS — Z79899 Other long term (current) drug therapy: Secondary | ICD-10-CM | POA: Insufficient documentation

## 2014-01-12 DIAGNOSIS — E785 Hyperlipidemia, unspecified: Secondary | ICD-10-CM | POA: Insufficient documentation

## 2014-01-12 DIAGNOSIS — R1030 Lower abdominal pain, unspecified: Secondary | ICD-10-CM

## 2014-01-12 LAB — CBC WITH DIFFERENTIAL/PLATELET
BASOS ABS: 0 10*3/uL (ref 0.0–0.1)
BASOS PCT: 1 % (ref 0–1)
EOS ABS: 0.3 10*3/uL (ref 0.0–0.7)
EOS PCT: 4 % (ref 0–5)
HCT: 41.9 % (ref 36.0–46.0)
Hemoglobin: 13.9 g/dL (ref 12.0–15.0)
LYMPHS ABS: 2.7 10*3/uL (ref 0.7–4.0)
Lymphocytes Relative: 44 % (ref 12–46)
MCH: 28.7 pg (ref 26.0–34.0)
MCHC: 33.2 g/dL (ref 30.0–36.0)
MCV: 86.6 fL (ref 78.0–100.0)
Monocytes Absolute: 0.5 10*3/uL (ref 0.1–1.0)
Monocytes Relative: 9 % (ref 3–12)
NEUTROS PCT: 43 % (ref 43–77)
Neutro Abs: 2.6 10*3/uL (ref 1.7–7.7)
PLATELETS: 238 10*3/uL (ref 150–400)
RBC: 4.84 MIL/uL (ref 3.87–5.11)
RDW: 13.7 % (ref 11.5–15.5)
WBC: 6.2 10*3/uL (ref 4.0–10.5)

## 2014-01-12 LAB — COMPREHENSIVE METABOLIC PANEL
ALK PHOS: 81 U/L (ref 39–117)
ALT: 21 U/L (ref 0–35)
AST: 21 U/L (ref 0–37)
Albumin: 4.3 g/dL (ref 3.5–5.2)
Anion gap: 12 (ref 5–15)
BUN: 19 mg/dL (ref 6–23)
CALCIUM: 10.4 mg/dL (ref 8.4–10.5)
CO2: 28 mEq/L (ref 19–32)
Chloride: 101 mEq/L (ref 96–112)
Creatinine, Ser: 0.9 mg/dL (ref 0.50–1.10)
GFR calc non Af Amer: 68 mL/min — ABNORMAL LOW (ref >90)
GFR, EST AFRICAN AMERICAN: 79 mL/min — AB (ref >90)
GLUCOSE: 100 mg/dL — AB (ref 70–99)
POTASSIUM: 3.9 meq/L (ref 3.7–5.3)
SODIUM: 141 meq/L (ref 137–147)
TOTAL PROTEIN: 8.1 g/dL (ref 6.0–8.3)
Total Bilirubin: 0.3 mg/dL (ref 0.3–1.2)

## 2014-01-12 LAB — URINALYSIS, ROUTINE W REFLEX MICROSCOPIC
BILIRUBIN URINE: NEGATIVE
Glucose, UA: NEGATIVE mg/dL
Hgb urine dipstick: NEGATIVE
Ketones, ur: NEGATIVE mg/dL
Nitrite: NEGATIVE
PH: 5.5 (ref 5.0–8.0)
Protein, ur: NEGATIVE mg/dL
Specific Gravity, Urine: 1.016 (ref 1.005–1.030)
Urobilinogen, UA: 0.2 mg/dL (ref 0.0–1.0)

## 2014-01-12 LAB — URINE MICROSCOPIC-ADD ON

## 2014-01-12 LAB — OCCULT BLOOD X 1 CARD TO LAB, STOOL: Fecal Occult Bld: NEGATIVE

## 2014-01-12 MED ORDER — IOHEXOL 300 MG/ML  SOLN
50.0000 mL | Freq: Once | INTRAMUSCULAR | Status: AC | PRN
  Administered 2014-01-12: 50 mL via ORAL

## 2014-01-12 MED ORDER — TRAMADOL HCL 50 MG PO TABS: 50.0000 mg | ORAL_TABLET | Freq: Four times a day (QID) | ORAL | Status: DC | PRN

## 2014-01-12 MED ORDER — MORPHINE SULFATE 4 MG/ML IJ SOLN
4.0000 mg | Freq: Once | INTRAMUSCULAR | Status: AC
  Administered 2014-01-12: 4 mg via INTRAVENOUS
  Filled 2014-01-12: qty 1

## 2014-01-12 MED ORDER — IOHEXOL 300 MG/ML  SOLN
100.0000 mL | Freq: Once | INTRAMUSCULAR | Status: AC | PRN
  Administered 2014-01-12: 100 mL via INTRAVENOUS

## 2014-01-12 NOTE — ED Notes (Signed)
Pt c/o upper abd pain which radiates to back only after eating x 2 weeks pt also reports " dark" stool

## 2014-01-12 NOTE — ED Provider Notes (Signed)
CSN: [id number]     Arrival date & time 01/12/14  2038 History  This chart was scribed for Margaret Dakin, MD by Ladene Artist, ED Scribe. The patient was seen in room MH02/MH02. Patient's care was started at 10:15 PM.   Chief Complaint  Patient presents with  . Abdominal Pain   Patient is a 63 y.o. female presenting with abdominal pain. The history is provided by the patient. No language interpreter was used.  Abdominal Pain Pain location:  Suprapubic Pain quality: bloating and cramping   Pain radiates to:  Back Pain severity:  Mild Onset quality:  Gradual Duration:  2 weeks Chronicity:  New Relieved by:  Acetaminophen Worsened by:  Eating and movement Ineffective treatments:  Eating Associated symptoms: no fever    HPI Comments: Margaret [name] is a 63 y.o. female, with a h/o GERD and H.pylori, who presents to the Emergency Department complaining of lower abdominal pain that radiates to back onset 1 week ago. Pain is exacerbated after eating and with standing. Pt reports associated abdominal bloating and L sided back pain. She currently rates her pain as mild. Last meal was at 3 PM. Last BM was today; pt reports "reddish" color. She denies fever. Pt has tried Protonix with relief and Tylenol with temporary relief. Pt stopped taking Zantac; she states that it does not help. Pt is a nonsmoker, no alcohol use. No known allergies. PCP: Penni Homans  Past Medical History  Diagnosis Date  . Chest pain   . Hyperlipidemia   . Sleep apnea   . GERD (gastroesophageal reflux disease)   . Obesity   . At risk for colon cancer     Last colonoscopy in [place] 03/2009 found hyperplastic polyp only  . Osteopenia 08/31/2012  . Left shoulder pain 11/14/2012  . Unspecified sinusitis (chronic) 03/30/2013  . Allergic state 03/25/2012  . Preventative health care 03/30/2013  . Acute sinusitis 03/30/2013   Past Surgical History  Procedure Laterality Date  . Dilation and curettage of uterus     Family  History  Problem Relation Age of Onset  . Stroke Mother   . Lung cancer Father    History  Substance Use Topics  . Smoking status: Never Smoker   . Smokeless tobacco: Never Used  . Alcohol Use: No   OB History   Grav Para Term Preterm Abortions TAB SAB Ect Mult Living                 Review of Systems  Constitutional: Negative for fever.  Gastrointestinal: Positive for abdominal pain.       Bloating  Musculoskeletal: Positive for back pain.  All other systems reviewed and are negative.  Allergies  Review of patient's allergies indicates no known allergies.  Home Medications   Prior to Admission medications   Medication Sig Start Date End Date Taking? Authorizing Provider  aspirin EC 81 MG tablet Take 81 mg by mouth every other day. 07/21/13   Mosie Lukes, MD  cetirizine (ZYRTEC) 10 MG tablet Take 1 tablet (10 mg total) by mouth daily as needed for allergies. 09/22/13   Mosie Lukes, MD  CRESTOR 10 MG tablet TAKE 1 TABLET (10 MG TOTAL) BY MOUTH DAILY.    Mosie Lukes, MD  fish oil-omega-3 fatty acids 1000 MG capsule Take 2 g by mouth daily.    Historical Provider, MD  MULTIPLE VITAMINS PO Take by mouth daily.    Historical Provider, MD  pantoprazole (PROTONIX) 20 MG tablet Take  1 tablet (20 mg total) by mouth daily. 11/12/13   Orpah Greek, MD  ranitidine (ZANTAC) 300 MG tablet Take 1 tablet (300 mg total) by mouth at bedtime. 12/26/13   Mosie Lukes, MD   Triage Vitals: BP 167/52  Pulse 59  Temp(Src) 98.1 F (36.7 C) (Oral)  Resp 16  Ht 5\' 2"  (1.575 m)  Wt 165 lb (74.844 kg)  BMI 30.17 kg/m2  SpO2 99% Physical Exam  Nursing note and vitals reviewed. Constitutional: She appears well-developed and well-nourished.  HENT:  Head: Normocephalic and atraumatic.  Eyes: Conjunctivae are normal. Pupils are equal, round, and reactive to light.  Neck: Neck supple. No tracheal deviation present. No thyromegaly present.  Cardiovascular: Normal rate and regular  rhythm.   No murmur heard. Pulmonary/Chest: Effort normal and breath sounds normal.  Abdominal: Soft. Bowel sounds are normal. She exhibits no distension. There is no tenderness.  Obese ,   Genitourinary: Guaiac negative stool.  Rectum normal tome browns stool, no gross blood  Musculoskeletal: Normal range of motion. She exhibits no edema and no tenderness.  Neurological: She is alert. Coordination normal.  Skin: Skin is warm and dry. No rash noted.  Psychiatric: She has a normal mood and affect.    ED Course  Procedures (including critical care time) DIAGNOSTIC STUDIES: Oxygen Saturation is 99% on RA, normal by my interpretation.    COORDINATION OF CARE: 10:24 PM-Discussed treatment plan which includes UA and CT with pt at bedside and pt agreed to plan.   Labs Review Labs Reviewed  URINALYSIS, ROUTINE W REFLEX MICROSCOPIC - Abnormal; Notable for the following:    Leukocytes, UA SMALL (*)    All other components within normal limits  URINE MICROSCOPIC-ADD ON - Abnormal; Notable for the following:    Squamous Epithelial / LPF FEW (*)    All other components within normal limits  COMPREHENSIVE METABOLIC PANEL - Abnormal; Notable for the following:    Glucose, Bld 100 (*)    GFR calc non Af Amer 68 (*)    GFR calc Af Amer 79 (*)    All other components within normal limits  CBC WITH DIFFERENTIAL  OCCULT BLOOD X 1 CARD TO LAB, STOOL   Imaging Review No results found.   EKG Interpretation None     1130 pm improved after treatment with iv morphine. Pt alert ambulatory, gcs 15. Pt signed out to Dr. Randal Buba Results for orders placed during the hospital encounter of 01/12/14  URINALYSIS, ROUTINE W REFLEX MICROSCOPIC      Result Value Ref Range   Color, Urine YELLOW  YELLOW   APPearance CLEAR  CLEAR   Specific Gravity, Urine 1.016  1.005 - 1.030   pH 5.5  5.0 - 8.0   Glucose, UA NEGATIVE  NEGATIVE mg/dL   Hgb urine dipstick NEGATIVE  NEGATIVE   Bilirubin Urine NEGATIVE   NEGATIVE   Ketones, ur NEGATIVE  NEGATIVE mg/dL   Protein, ur NEGATIVE  NEGATIVE mg/dL   Urobilinogen, UA 0.2  0.0 - 1.0 mg/dL   Nitrite NEGATIVE  NEGATIVE   Leukocytes, UA SMALL (*) NEGATIVE  URINE MICROSCOPIC-ADD ON      Result Value Ref Range   Squamous Epithelial / LPF FEW (*) RARE   WBC, UA 0-2  <3 WBC/hpf   RBC / HPF 0-2  <3 RBC/hpf   Bacteria, UA RARE  RARE  CBC WITH DIFFERENTIAL      Result Value Ref Range   WBC 6.2  4.0 -  10.5 K/uL   RBC 4.84  3.87 - 5.11 MIL/uL   Hemoglobin 13.9  12.0 - 15.0 g/dL   HCT 41.9  36.0 - 46.0 %   MCV 86.6  78.0 - 100.0 fL   MCH 28.7  26.0 - 34.0 pg   MCHC 33.2  30.0 - 36.0 g/dL   RDW 13.7  11.5 - 15.5 %   Platelets 238  150 - 400 K/uL   Neutrophils Relative % 43  43 - 77 %   Neutro Abs 2.6  1.7 - 7.7 K/uL   Lymphocytes Relative 44  12 - 46 %   Lymphs Abs 2.7  0.7 - 4.0 K/uL   Monocytes Relative 9  3 - 12 %   Monocytes Absolute 0.5  0.1 - 1.0 K/uL   Eosinophils Relative 4  0 - 5 %   Eosinophils Absolute 0.3  0.0 - 0.7 K/uL   Basophils Relative 1  0 - 1 %   Basophils Absolute 0.0  0.0 - 0.1 K/uL  COMPREHENSIVE METABOLIC PANEL      Result Value Ref Range   Sodium 141  137 - 147 mEq/L   Potassium 3.9  3.7 - 5.3 mEq/L   Chloride 101  96 - 112 mEq/L   CO2 28  19 - 32 mEq/L   Glucose, Bld 100 (*) 70 - 99 mg/dL   BUN 19  6 - 23 mg/dL   Creatinine, Ser 0.90  0.50 - 1.10 mg/dL   Calcium 10.4  8.4 - 10.5 mg/dL   Total Protein 8.1  6.0 - 8.3 g/dL   Albumin 4.3  3.5 - 5.2 g/dL   AST 21  0 - 37 U/L   ALT 21  0 - 35 U/L   Alkaline Phosphatase 81  39 - 117 U/L   Total Bilirubin 0.3  0.3 - 1.2 mg/dL   GFR calc non Af Amer 68 (*) >90 mL/min   GFR calc Af Amer 79 (*) >90 mL/min   Anion gap 12  5 - 15  OCCULT BLOOD X 1 CARD TO LAB, STOOL      Result Value Ref Range   Fecal Occult Bld NEGATIVE  NEGATIVE   No results found.  MDM  Dx Abdominal pain Final diagnoses:  None      I personally performed the services described in this  documentation, which was scribed in my presence. The recorded information has been reviewed and is accurate.    Margaret Dakin, MD 01/12/14 [phone]

## 2014-01-12 NOTE — Discharge Instructions (Signed)
Abdominal Pain, Women °Abdominal (stomach, pelvic, or belly) pain can be caused by many things. It is important to tell your doctor: °· The location of the pain. °· Does it come and go or is it present all the time? °· Are there things that start the pain (eating certain foods, exercise)? °· Are there other symptoms associated with the pain (fever, nausea, vomiting, diarrhea)? °All of this is helpful to know when trying to find the cause of the pain. °CAUSES  °· Stomach: virus or bacteria infection, or ulcer. °· Intestine: appendicitis (inflamed appendix), regional ileitis (Crohn's disease), ulcerative colitis (inflamed colon), irritable bowel syndrome, diverticulitis (inflamed diverticulum of the colon), or cancer of the stomach or intestine. °· Gallbladder disease or stones in the gallbladder. °· Kidney disease, kidney stones, or infection. °· Pancreas infection or cancer. °· Fibromyalgia (pain disorder). °· Diseases of the female organs: °¨ Uterus: fibroid (non-cancerous) tumors or infection. °¨ Fallopian tubes: infection or tubal pregnancy. °¨ Ovary: cysts or tumors. °¨ Pelvic adhesions (scar tissue). °¨ Endometriosis (uterus lining tissue growing in the pelvis and on the pelvic organs). °¨ Pelvic congestion syndrome (female organs filling up with blood just before the menstrual period). °¨ Pain with the menstrual period. °¨ Pain with ovulation (producing an egg). °¨ Pain with an IUD (intrauterine device, birth control) in the uterus. °¨ Cancer of the female organs. °· Functional pain (pain not caused by a disease, may improve without treatment). °· Psychological pain. °· Depression. °DIAGNOSIS  °Your doctor will decide the seriousness of your pain by doing an examination. °· Blood tests. °· X-rays. °· Ultrasound. °· CT scan (computed tomography, special type of X-ray). °· MRI (magnetic resonance imaging). °· Cultures, for infection. °· Barium enema (dye inserted in the large intestine, to better view it with  X-rays). °· Colonoscopy (looking in intestine with a lighted tube). °· Laparoscopy (minor surgery, looking in abdomen with a lighted tube). °· Major abdominal exploratory surgery (looking in abdomen with a large incision). °TREATMENT  °The treatment will depend on the cause of the pain.  °· Many cases can be observed and treated at home. °· Over-the-counter medicines recommended by your caregiver. °· Prescription medicine. °· Antibiotics, for infection. °· Birth control pills, for painful periods or for ovulation pain. °· Hormone treatment, for endometriosis. °· Nerve blocking injections. °· Physical therapy. °· Antidepressants. °· Counseling with a psychologist or psychiatrist. °· Minor or major surgery. °HOME CARE INSTRUCTIONS  °· Do not take laxatives, unless directed by your caregiver. °· Take over-the-counter pain medicine only if ordered by your caregiver. Do not take aspirin because it can cause an upset stomach or bleeding. °· Try a clear liquid diet (broth or water) as ordered by your caregiver. Slowly move to a bland diet, as tolerated, if the pain is related to the stomach or intestine. °· Have a thermometer and take your temperature several times a day, and record it. °· Bed rest and sleep, if it helps the pain. °· Avoid sexual intercourse, if it causes pain. °· Avoid stressful situations. °· Keep your follow-up appointments and tests, as your caregiver orders. °· If the pain does not go away with medicine or surgery, you may try: °¨ Acupuncture. °¨ Relaxation exercises (yoga, meditation). °¨ Group therapy. °¨ Counseling. °SEEK MEDICAL CARE IF:  °· You notice certain foods cause stomach pain. °· Your home care treatment is not helping your pain. °· You need stronger pain medicine. °· You want your IUD removed. °· You feel faint or   lightheaded. °· You develop nausea and vomiting. °· You develop a rash. °· You are having side effects or an allergy to your medicine. °SEEK IMMEDIATE MEDICAL CARE IF:  °· Your  pain does not go away or gets worse. °· You have a fever. °· Your pain is felt only in portions of the abdomen. The right side could possibly be appendicitis. The left lower portion of the abdomen could be colitis or diverticulitis. °· You are passing blood in your stools (bright red or black tarry stools, with or without vomiting). °· You have blood in your urine. °· You develop chills, with or without a fever. °· You pass out. °MAKE SURE YOU:  °· Understand these instructions. °· Will watch your condition. °· Will get help right away if you are not doing well or get worse. °Document Released: 03/12/2007 Document Revised: 09/29/2013 Document Reviewed: 04/01/2009 °ExitCare® Patient Information ©2015 [employer]. This information is not intended to replace advice given to you by your health care provider. Make sure you discuss any questions you have with your health care provider. ° °

## 2014-01-13 ENCOUNTER — Telehealth: Payer: Self-pay | Admitting: Family Medicine

## 2014-01-13 DIAGNOSIS — R300 Dysuria: Secondary | ICD-10-CM

## 2014-01-13 NOTE — Telephone Encounter (Signed)
If her pain continues she should come in soon, has she found the Tramadol helpful. We can give a small amount in a refill til she can be  seen

## 2014-01-13 NOTE — Telephone Encounter (Signed)
Pt called requesting appointment today with Dr Charlett Blake, pt was told Dr Charlett Blake was booked and was offered an appointment with PA. Pt refused says "why can't Dr Charlett Blake see me"? Told pt schedule was already booked for today. Requesting now a call back from Dr Charlett Blake ASAP. States she had an emergency last night and needs to see you to talk about GI referral.

## 2014-01-13 NOTE — Telephone Encounter (Signed)
Pt stated that she is still have pain in her upper back that shoots down to her lower back. Pt states dysuria is present. Pt informed of lab results. Orders have been placed for urine culture.

## 2014-01-13 NOTE — Telephone Encounter (Signed)
So I have reviewed her chart, her urinalysis was suspicious enough given her symptoms that I would suggest she come give [place] a sample for culture so we can further investigate this. I can refer her on to GI if she wants. How is she feeling today, any better, is the pain still all low or any fevers?

## 2014-01-14 ENCOUNTER — Other Ambulatory Visit: Payer: Self-pay | Admitting: Family Medicine

## 2014-01-15 LAB — URINE CULTURE
COLONY COUNT: NO GROWTH
ORGANISM ID, BACTERIA: NO GROWTH

## 2014-01-15 NOTE — Telephone Encounter (Signed)
Pt wants to know if she can come in and be seen today.  I did inform her that your schedule was 100% booked today, but she still wanted to see if you could fit her into your schedule today. Please advise

## 2014-01-15 NOTE — Telephone Encounter (Signed)
Patient called back regarding this. I offered patient appointment this afternoon with [facility] but patient declined stating that she only wanted to see Dr. Charlett Blake.

## 2014-01-15 NOTE — Telephone Encounter (Signed)
Left message for patient to return my call.

## 2014-01-15 NOTE — Telephone Encounter (Signed)
Patient scheduled appointment for Monday at 4:15 and she would like a callback regarding culture results

## 2014-01-15 NOTE — Telephone Encounter (Signed)
I am truly sorry but I had an am meeting, a lunch meeting and a 6 pm meeting so I cannot stay and see her. I would be willing to squeeze her in at lunch or at end of day for a quick visit on Monday, I am not here tomorrow. If she needs urgent care she will have to be seen elsewhere

## 2014-01-19 ENCOUNTER — Ambulatory Visit (INDEPENDENT_AMBULATORY_CARE_PROVIDER_SITE_OTHER): Payer: BC Managed Care – PPO | Admitting: Family Medicine

## 2014-01-19 ENCOUNTER — Encounter: Payer: Self-pay | Admitting: Family Medicine

## 2014-01-19 VITALS — BP 110/82 | HR 55 | Temp 99.2°F | Ht 62.0 in | Wt 172.0 lb

## 2014-01-19 DIAGNOSIS — R0789 Other chest pain: Secondary | ICD-10-CM

## 2014-01-19 DIAGNOSIS — K2100 Gastro-esophageal reflux disease with esophagitis, without bleeding: Secondary | ICD-10-CM

## 2014-01-19 DIAGNOSIS — R7309 Other abnormal glucose: Secondary | ICD-10-CM

## 2014-01-19 DIAGNOSIS — R739 Hyperglycemia, unspecified: Secondary | ICD-10-CM

## 2014-01-19 DIAGNOSIS — K449 Diaphragmatic hernia without obstruction or gangrene: Secondary | ICD-10-CM

## 2014-01-19 DIAGNOSIS — A048 Other specified bacterial intestinal infections: Secondary | ICD-10-CM

## 2014-01-19 DIAGNOSIS — K219 Gastro-esophageal reflux disease without esophagitis: Secondary | ICD-10-CM

## 2014-01-19 MED ORDER — SUCRALFATE 1 GM/10ML PO SUSP: 1.0000 g | Freq: Three times a day (TID) | ORAL | Status: DC

## 2014-01-19 MED ORDER — PANTOPRAZOLE SODIUM 40 MG PO TBEC: 40.0000 mg | DELAYED_RELEASE_TABLET | Freq: Every day | ORAL | Status: DC

## 2014-01-19 NOTE — Telephone Encounter (Signed)
Returned call to pt// no answer, LMOVM to call back

## 2014-01-19 NOTE — Telephone Encounter (Signed)
Pt is in the office now

## 2014-01-19 NOTE — Progress Notes (Signed)
Pre visit review using our clinic review tool, if applicable. No additional management support is needed unless otherwise documented below in the visit note. 

## 2014-01-19 NOTE — Patient Instructions (Signed)
Hiatal Hernia A hiatal hernia occurs when part of your stomach slides above the muscle that separates your abdomen from your chest (diaphragm). You can be born with a hiatal hernia (congenital), or it may develop over time. In almost all cases of hiatal hernia, only the top part of the stomach pushes through.  Many people have a hiatal hernia with no symptoms. The larger the hernia, the more likely that you will have symptoms. In some cases, a hiatal hernia allows stomach acid to flow back into the tube that carries food from your mouth to your stomach (esophagus). This may cause heartburn symptoms. Severe heartburn symptoms may mean you have developed a condition called gastroesophageal reflux disease (GERD).  CAUSES  Hiatal hernias are caused by a weakness in the opening (hiatus) where your esophagus passes through your diaphragm to attach to the upper part of your stomach. You may be born with a weakness in your hiatus, or a weakness can develop. RISK FACTORS Older age is a major risk factor for a hiatal hernia. Anything that increases pressure on your diaphragm can also increase your risk of a hiatal hernia. This includes:  Pregnancy.  Excess weight.  Frequent constipation. SIGNS AND SYMPTOMS  People with a hiatal hernia often have no symptoms. If symptoms develop, they are almost always caused by GERD. They may include:  Heartburn.  Belching.  Indigestion.  Trouble swallowing.  Coughing or wheezing.  Sore throat.  Hoarseness.  Chest pain. DIAGNOSIS  A hiatal hernia is sometimes found during an exam for another problem. Your health care provider may suspect a hiatal hernia if you have symptoms of GERD. Tests may be done to diagnose GERD. These may include:  X-rays of your stomach or chest.  An upper gastrointestinal (GI) series. This is an X-ray exam of your GI tract involving the use of a chalky liquid that you swallow. The liquid shows up clearly on the X-ray.  Endoscopy.  This is a procedure to look into your stomach using a thin, flexible tube that has a tiny camera and light on the end of it. TREATMENT  If you have no symptoms, you may not need treatment. If you have symptoms, treatment may include:  Dietary and lifestyle changes to help reduce GERD symptoms.  Medicines. These may include:  Over-the-counter antacids.  Medicines that make your stomach empty more quickly.  Medicines that block the production of stomach acid (H2 blockers).  Stronger medicines to reduce stomach acid (proton pump inhibitors).  You may need surgery to repair the hernia if other treatments are not helping. HOME CARE INSTRUCTIONS   Take all medicines as directed by your health care provider.  Quit smoking, if you smoke.  Try to achieve and maintain a healthy body weight.  Eat frequent small meals instead of three large meals a day. This keeps your stomach from getting too full.  Eat slowly.  Do not lie down right after eating.  Do noteat 1-2 hours before bed.   Do not drink beverages with caffeine. These include cola, coffee, cocoa, and tea.  Do not drink alcohol.  Avoid foods that can make symptoms of GERD worse. These may include:  Fatty foods.  Citrus fruits.  Other foods and drinks that contain acid.  Avoid putting pressure on your belly. Anything that puts pressure on your belly increases the amount of acid that may be pushed up into your esophagus.   Avoid bending over, especially after eating.  Raise the head of your bed   by putting blocks under the legs. This keeps your head and esophagus higher than your stomach.  Do not wear tight clothing around your chest or stomach.  Try not to strain when having a bowel movement, when urinating, or when lifting heavy objects. SEEK MEDICAL CARE IF:  Your symptoms are not controlled with medicines or lifestyle changes.  You are having trouble swallowing.  You have coughing or wheezing that will not  go away. SEEK IMMEDIATE MEDICAL CARE IF:  Your pain is getting worse.  Your pain spreads to your arms, neck, jaw, teeth, or back.  You have shortness of breath.  You sweat for no reason.  You feel sick to your stomach (nauseous) or vomit.  You vomit blood.  You have bright red blood in your stools.  You have black, tarry stools.  Document Released: 08/05/2003 Document Revised: 09/29/2013 Document Reviewed: 05/02/2013 Exit[employer] Patient Information 2015 [employer]. This information is not intended to replace advice given to you by your health care provider. Make sure you discuss any questions you have with your health care provider.  

## 2014-01-21 ENCOUNTER — Encounter: Payer: Self-pay | Admitting: Family Medicine

## 2014-01-21 DIAGNOSIS — R0789 Other chest pain: Secondary | ICD-10-CM | POA: Insufficient documentation

## 2014-01-21 NOTE — Assessment & Plan Note (Signed)
Continue Zantac but since symptoms are not resolved will add back Protonix and add Sucralfate. Referred back to Gastroenterology due to severity of symptoms

## 2014-01-21 NOTE — Assessment & Plan Note (Signed)
Repeat testing negative

## 2014-01-21 NOTE — Assessment & Plan Note (Signed)
Likely related to Hiatal hernia and reflux, will start carafate and avoid offending foods. If symptoms return and do not resolve then will need further evaluation

## 2014-01-21 NOTE — Assessment & Plan Note (Signed)
Mild, minimize simple carbs

## 2014-01-21 NOTE — Progress Notes (Signed)
Patient ID: Margaret [name], female   DOB: Nov 27, 1950, 63 y.o.   MRN: [id number] Margaret [name] [id number] Jul 18, 1950 01/21/2014      Progress Note-Follow Up  Subjective  Chief Complaint  Chief Complaint  Patient presents with  . Follow-up    talk to Dr Charlett Blake    HPI  Patient is a 64 year old female in today for routine medical care. Patient in today complaining of persistent epigastric pain and heartburn despite recent negative H. pylori testing. She reports the pain and discomfort is constant but worse with eating. She has chest pain as well as atypical mid back pain. She is taking Zantac with no significant improvement. Denies any bloody or tarry stool. No constipation diarrhea. No fevers or chills. No shortness or breath or palpitations. No GU c/o  Past Medical History  Diagnosis Date  . Chest pain   . Hyperlipidemia   . Sleep apnea   . GERD (gastroesophageal reflux disease)   . Obesity   . At risk for colon cancer     Last colonoscopy in [place] 03/2009 found hyperplastic polyp only  . Osteopenia 08/31/2012  . Left shoulder pain 11/14/2012  . Unspecified sinusitis (chronic) 03/30/2013  . Allergic state 03/25/2012  . Preventative health care 03/30/2013  . Acute sinusitis 03/30/2013  . Hiatal hernia with gastroesophageal reflux 04/27/2010    Qualifier: Diagnosis of  By: Danelle Earthly CMA, Darlene  Failed Omeprazole and Protonix     Past Surgical History  Procedure Laterality Date  . Dilation and curettage of uterus      Family History  Problem Relation Age of Onset  . Stroke Mother   . Lung cancer Father     History   Social History  . Marital Status: Married    Spouse Name: N/A    Number of Children: 2  . Years of Education: N/A   Occupational History  . Accountant    Social History Main Topics  . Smoking status: Never Smoker   . Smokeless tobacco: Never Used  . Alcohol Use: No  . Drug Use: No  . Sexual Activity: Not on file   Other Topics Concern  . Not on file    Social History Narrative  . No narrative on file    Current Outpatient Prescriptions on File Prior to Visit  Medication Sig Dispense Refill  . aspirin EC 81 MG tablet Take 81 mg by mouth every other day.      . cetirizine (ZYRTEC) 10 MG tablet Take 1 tablet (10 mg total) by mouth daily as needed for allergies.  30 tablet  5  . CRESTOR 10 MG tablet TAKE 1 TABLET (10 MG TOTAL) BY MOUTH DAILY.  30 tablet  1  . fish oil-omega-3 fatty acids 1000 MG capsule Take 2 g by mouth daily.      . MULTIPLE VITAMINS PO Take by mouth daily.      . ranitidine (ZANTAC) 300 MG tablet Take 1 tablet (300 mg total) by mouth at bedtime.  30 tablet  2  . traMADol (ULTRAM) 50 MG tablet Take 1 tablet (50 mg total) by mouth every 6 (six) hours as needed.  15 tablet  0   No current facility-administered medications on file prior to visit.    No Known Allergies  Review of Systems  Review of Systems  Constitutional: Negative for fever and malaise/fatigue.  HENT: Negative for congestion.   Eyes: Negative for discharge.  Respiratory: Negative for shortness of breath.   Cardiovascular: Positive for  chest pain. Negative for palpitations and leg swelling.  Gastrointestinal: Positive for heartburn, nausea and abdominal pain. Negative for vomiting and diarrhea.  Genitourinary: Negative for dysuria.  Musculoskeletal: Negative for falls.  Skin: Negative for rash.  Neurological: Negative for loss of consciousness and headaches.  Endo/Heme/Allergies: Negative for polydipsia.  Psychiatric/Behavioral: Negative for depression and suicidal ideas. The patient is not nervous/anxious and does not have insomnia.     Objective  BP 110/82  Pulse 55  Temp(Src) 99.2 F (37.3 C) (Oral)  Ht 5\' 2"  (1.575 m)  Wt 172 lb (78.019 kg)  BMI 31.45 kg/m2  SpO2 96%  Physical Exam  Physical Exam  Constitutional: She is oriented to person, place, and time and well-developed, well-nourished, and in no distress. No distress.   HENT:  Head: Normocephalic and atraumatic.  Eyes: Conjunctivae are normal.  Neck: Neck supple. No thyromegaly present.  Cardiovascular: Normal rate, regular rhythm and normal heart sounds.   No murmur heard. Pulmonary/Chest: Effort normal and breath sounds normal. She has no wheezes.  Abdominal: Soft. Bowel sounds are normal. She exhibits no distension and no mass. There is tenderness. There is no rebound and no guarding.  Tender to palp in epigastrium  Genitourinary: Right adnexa normal.  Musculoskeletal: She exhibits no edema.  Lymphadenopathy:    She has no cervical adenopathy.  Neurological: She is alert and oriented to person, place, and time.  Skin: Skin is warm and dry. No rash noted. She is not diaphoretic.  Psychiatric: Memory, affect and judgment normal.    Lab Results  Component Value Date   TSH 1.513 07/21/2013   Lab Results  Component Value Date   WBC 6.2 01/12/2014   HGB 13.9 01/12/2014   HCT 41.9 01/12/2014   MCV 86.6 01/12/2014   PLT 238 01/12/2014   Lab Results  Component Value Date   CREATININE 0.90 01/12/2014   BUN 19 01/12/2014   NA 141 01/12/2014   K 3.9 01/12/2014   CL 101 01/12/2014   CO2 28 01/12/2014   Lab Results  Component Value Date   ALT 21 01/12/2014   AST 21 01/12/2014   ALKPHOS 81 01/12/2014   BILITOT 0.3 01/12/2014   Lab Results  Component Value Date   CHOL 186 07/21/2013   Lab Results  Component Value Date   HDL 53 07/21/2013   Lab Results  Component Value Date   LDLCALC 117* 07/21/2013   Lab Results  Component Value Date   TRIG 81 07/21/2013   Lab Results  Component Value Date   CHOLHDL 3.5 07/21/2013     Assessment & Plan  Hiatal hernia with gastroesophageal reflux Continue Zantac but since symptoms are not resolved will add back Protonix and add Sucralfate. Referred back to Gastroenterology due to severity of symptoms  Hyperglycemia Mild, minimize simple carbs  HELICOBACTER PYLORI INFECTION Repeat testing  negative  Atypical chest pain Likely related to Hiatal hernia and reflux, will start carafate and avoid offending foods. If symptoms return and do not resolve then will need further evaluation

## 2014-02-05 ENCOUNTER — Encounter: Payer: Self-pay | Admitting: Family Medicine

## 2014-02-05 ENCOUNTER — Ambulatory Visit (INDEPENDENT_AMBULATORY_CARE_PROVIDER_SITE_OTHER): Payer: BC Managed Care – PPO | Admitting: Family Medicine

## 2014-02-05 VITALS — BP 118/71 | HR 66 | Temp 98.4°F | Ht 62.0 in | Wt 170.0 lb

## 2014-02-05 DIAGNOSIS — R739 Hyperglycemia, unspecified: Secondary | ICD-10-CM

## 2014-02-05 DIAGNOSIS — G4730 Sleep apnea, unspecified: Secondary | ICD-10-CM

## 2014-02-05 DIAGNOSIS — K219 Gastro-esophageal reflux disease without esophagitis: Secondary | ICD-10-CM

## 2014-02-05 DIAGNOSIS — K449 Diaphragmatic hernia without obstruction or gangrene: Secondary | ICD-10-CM

## 2014-02-05 DIAGNOSIS — E785 Hyperlipidemia, unspecified: Secondary | ICD-10-CM

## 2014-02-05 DIAGNOSIS — R7309 Other abnormal glucose: Secondary | ICD-10-CM

## 2014-02-05 DIAGNOSIS — R300 Dysuria: Secondary | ICD-10-CM

## 2014-02-05 DIAGNOSIS — I10 Essential (primary) hypertension: Secondary | ICD-10-CM

## 2014-02-05 DIAGNOSIS — Z0000 Encounter for general adult medical examination without abnormal findings: Secondary | ICD-10-CM

## 2014-02-05 LAB — HEPATIC FUNCTION PANEL
ALT: 23 U/L (ref 0–35)
AST: 22 U/L (ref 0–37)
Albumin: 4.2 g/dL (ref 3.5–5.2)
Alkaline Phosphatase: 70 U/L (ref 39–117)
BILIRUBIN DIRECT: 0 mg/dL (ref 0.0–0.3)
BILIRUBIN TOTAL: 0.6 mg/dL (ref 0.2–1.2)
Total Protein: 7.6 g/dL (ref 6.0–8.3)

## 2014-02-05 LAB — URINALYSIS, ROUTINE W REFLEX MICROSCOPIC
Bilirubin Urine: NEGATIVE
Hgb urine dipstick: NEGATIVE
KETONES UR: NEGATIVE
Nitrite: NEGATIVE
PH: 6 (ref 5.0–8.0)
Specific Gravity, Urine: <=1.005 — AB (ref 1.000–1.030)
TOTAL PROTEIN, URINE-UPE24: NEGATIVE
URINE GLUCOSE: NEGATIVE
Urobilinogen, UA: 0.2 (ref 0.0–1.0)

## 2014-02-05 LAB — CBC
HEMATOCRIT: 39 % (ref 36.0–46.0)
HEMOGLOBIN: 12.9 g/dL (ref 12.0–15.0)
MCHC: 33 g/dL (ref 30.0–36.0)
MCV: 86 fl (ref 78.0–100.0)
PLATELETS: 226 10*3/uL (ref 150.0–400.0)
RBC: 4.54 Mil/uL (ref 3.87–5.11)
RDW: 13.7 % (ref 11.5–15.5)
WBC: 5.8 10*3/uL (ref 4.0–10.5)

## 2014-02-05 LAB — RENAL FUNCTION PANEL
Albumin: 4.2 g/dL (ref 3.5–5.2)
BUN: 16 mg/dL (ref 6–23)
CO2: 30 meq/L (ref 19–32)
CREATININE: 0.7 mg/dL (ref 0.4–1.2)
Calcium: 9.7 mg/dL (ref 8.4–10.5)
Chloride: 104 mEq/L (ref 96–112)
GFR: 84.88 mL/min (ref >60.00)
Glucose, Bld: 83 mg/dL (ref 70–99)
POTASSIUM: 3.8 meq/L (ref 3.5–5.1)
Phosphorus: 3.4 mg/dL (ref 2.3–4.6)
SODIUM: 138 meq/L (ref 135–145)

## 2014-02-05 LAB — LIPID PANEL
CHOLESTEROL: 222 mg/dL — AB (ref 0–200)
HDL: 46 mg/dL (ref >39.00)
LDL Cholesterol: 153 mg/dL — ABNORMAL HIGH (ref 0–99)
NONHDL: 176
Total CHOL/HDL Ratio: 5
Triglycerides: 116 mg/dL (ref 0.0–149.0)
VLDL: 23.2 mg/dL (ref 0.0–40.0)

## 2014-02-05 LAB — TSH: TSH: 0.83 u[IU]/mL (ref 0.35–4.50)

## 2014-02-05 NOTE — Progress Notes (Signed)
Pre visit review using our clinic review tool, if applicable. No additional management support is needed unless otherwise documented below in the visit note. 

## 2014-02-05 NOTE — Patient Instructions (Signed)
Moist heat  And [place] daily or twice daily, call if worse   Back Pain, Adult Low back pain is very common. About 1 in 5 people have back pain.The cause of low back pain is rarely dangerous. The pain often gets better over time.About half of people with a sudden onset of back pain feel better in just 2 weeks. About 8 in 10 people feel better by 6 weeks.    CAUSES Some common causes of back pain include:  Strain of the muscles or ligaments supporting the spine.  Wear and tear (degeneration) of the spinal discs.  Arthritis.  Direct injury to the back. DIAGNOSIS Most of the time, the direct cause of low back pain is not known.However, back pain can be treated effectively even when the exact cause of the pain is unknown.Answering your caregiver's questions about your overall health and symptoms is one of the most accurate ways to make sure the cause of your pain is not dangerous. If your caregiver needs more information, he or she may order lab work or imaging tests (X-rays or MRIs).However, even if imaging tests show changes in your back, this usually does not require surgery. HOME CARE INSTRUCTIONS For many people, back pain returns.Since low back pain is rarely dangerous, it is often a condition that people can learn to [facility] their own.   Remain active. It is stressful on the back to sit or stand in one place. Do not sit, drive, or stand in one place for more than 30 minutes at a time. Take short walks on level surfaces as soon as pain allows.Try to increase the length of time you walk each day.  Do not stay in bed.Resting more than 1 or 2 days can delay your recovery.  Do not avoid exercise or work.Your body is made to move.It is not dangerous to be active, even though your back may hurt.Your back will likely heal faster if you return to being active before your pain is gone.  Pay attention to your body when you bend and lift. Many people have less discomfortwhen  lifting if they bend their knees, keep the load close to their bodies,and avoid twisting. Often, the most comfortable positions are those that put less stress on your recovering back.  Find a comfortable position to sleep. Use a firm mattress and lie on your side with your knees slightly bent. If you lie on your back, put a pillow under your knees.  Only take over-the-counter or prescription medicines as directed by your caregiver. Over-the-counter medicines to reduce pain and inflammation are often the most helpful.Your caregiver may prescribe muscle relaxant drugs.These medicines help dull your pain so you can more quickly return to your normal activities and healthy exercise.  Put ice on the injured area.  Put ice in a plastic bag.  Place a towel between your skin and the bag.  Leave the ice on for 15-20 minutes, 03-04 times a day for the first 2 to 3 days. After that, ice and heat may be alternated to reduce pain and spasms.  Ask your caregiver about trying back exercises and gentle massage. This may be of some benefit.  Avoid feeling anxious or stressed.Stress increases muscle tension and can worsen back pain.It is important to recognize when you are anxious or stressed and learn ways to manage it.Exercise is a great option. SEEK MEDICAL CARE IF:  You have pain that is not relieved with rest or medicine.  You have pain that does not  improve in 1 week.  You have new symptoms.  You are generally not feeling well. SEEK IMMEDIATE MEDICAL CARE IF:   You have pain that radiates from your back into your legs.  You develop new bowel or bladder control problems.  You have unusual weakness or numbness in your arms or legs.  You develop nausea or vomiting.  You develop abdominal pain.  You feel faint. Document Released: 05/15/2005 Document Revised: 11/14/2011 Document Reviewed: 09/16/2013 [facility] Patient Information 2015 [place], [place]. This information is not intended to  replace advice given to you by your health care provider. Make sure you discuss any questions you have with your health care provider.

## 2014-02-05 NOTE — Progress Notes (Signed)
Patient ID: Margaret [name], female   DOB: 1950-11-18, 63 y.o.   MRN: [id number] Margaret [name] [id number] 1951/01/09 02/05/2014      Progress Note-Follow Up  Subjective  Chief Complaint  Chief Complaint  Patient presents with  . Annual Exam    physical    HPI  Patient is a 63 year old female in today for routine medical care. Improved with acid suppression Carafate and is struggling with slight swelling in bowels moving bowels roughly every other day. No bloody or tarry stool. No anorexia, nausea vomiting. No fevers, chills, back pain abdominal pain. Is complaining of some mild dysuria but no hematuria or incontinence. Denies CP/palp/SOB/HA/congestion/fevers. Taking meds as prescribed  Past Medical History  Diagnosis Date  . Chest pain   . Hyperlipidemia   . Sleep apnea   . GERD (gastroesophageal reflux disease)   . Obesity   . At risk for colon cancer     Last colonoscopy in [place] 03/2009 found hyperplastic polyp only  . Osteopenia 08/31/2012  . Left shoulder pain 11/14/2012  . Unspecified sinusitis (chronic) 03/30/2013  . Allergic state 03/25/2012  . Preventative health care 03/30/2013  . Acute sinusitis 03/30/2013  . Hiatal hernia with gastroesophageal reflux 04/27/2010    Qualifier: Diagnosis of  By: Danelle Earthly CMA, Darlene  Failed Omeprazole and Protonix     Past Surgical History  Procedure Laterality Date  . Dilation and curettage of uterus      Family History  Problem Relation Age of Onset  . Stroke Mother   . Lung cancer Father     History   Social History  . Marital Status: Married    Spouse Name: N/A    Number of Children: 2  . Years of Education: N/A   Occupational History  . Accountant    Social History Main Topics  . Smoking status: Never Smoker   . Smokeless tobacco: Never Used  . Alcohol Use: No  . Drug Use: No  . Sexual Activity: Not on file   Other Topics Concern  . Not on file   Social History Narrative  . No narrative on file    Current  Outpatient Prescriptions on File Prior to Visit  Medication Sig Dispense Refill  . aspirin EC 81 MG tablet Take 81 mg by mouth every other day.      . cetirizine (ZYRTEC) 10 MG tablet Take 1 tablet (10 mg total) by mouth daily as needed for allergies.  30 tablet  5  . CRESTOR 10 MG tablet TAKE 1 TABLET (10 MG TOTAL) BY MOUTH DAILY.  30 tablet  1  . fish oil-omega-3 fatty acids 1000 MG capsule Take 2 g by mouth daily.      . MULTIPLE VITAMINS PO Take by mouth daily.      . pantoprazole (PROTONIX) 40 MG tablet Take 1 tablet (40 mg total) by mouth daily.  30 tablet  3  . ranitidine (ZANTAC) 300 MG tablet Take 1 tablet (300 mg total) by mouth at bedtime.  30 tablet  2  . sucralfate (CARAFATE) 1 GM/10ML suspension Take 10 mLs (1 g total) by mouth 4 (four) times daily -  with meals and at bedtime.  420 mL  1  . traMADol (ULTRAM) 50 MG tablet Take 1 tablet (50 mg total) by mouth every 6 (six) hours as needed.  15 tablet  0   No current facility-administered medications on file prior to visit.    No Known Allergies  Review of Systems  Review of Systems  Constitutional: Negative for fever, chills and malaise/fatigue.  HENT: Negative for congestion, hearing loss and nosebleeds.   Eyes: Negative for discharge.  Respiratory: Negative for cough, sputum production, shortness of breath and wheezing.   Cardiovascular: Negative for chest pain, palpitations and leg swelling.  Gastrointestinal: Positive for heartburn and abdominal pain. Negative for nausea, vomiting, diarrhea, constipation and blood in stool.  Genitourinary: Positive for dysuria and urgency. Negative for frequency and hematuria.  Musculoskeletal: Positive for back pain. Negative for falls and myalgias.  Skin: Negative for rash.  Neurological: Negative for dizziness, tremors, sensory change, focal weakness, loss of consciousness, weakness and headaches.  Endo/Heme/Allergies: Negative for polydipsia. Does not bruise/bleed easily.   Psychiatric/Behavioral: Negative for depression and suicidal ideas. The patient is not nervous/anxious and does not have insomnia.     Objective  BP 118/71  Pulse 66  Temp(Src) 98.4 F (36.9 C) (Oral)  Ht 5\' 2"  (1.575 m)  Wt 170 lb 0.6 oz (77.13 kg)  BMI 31.09 kg/m2  SpO2 97%  Physical Exam  Physical Exam  Constitutional: She is oriented to person, place, and time and well-developed, well-nourished, and in no distress. No distress.  HENT:  Head: Normocephalic and atraumatic.  Right Ear: External ear normal.  Left Ear: External ear normal.  Nose: Nose normal.  Mouth/Throat: Oropharynx is clear and moist. No oropharyngeal exudate.  Eyes: Conjunctivae are normal. Pupils are equal, round, and reactive to light. Right eye exhibits no discharge. Left eye exhibits no discharge. No scleral icterus.  Neck: Normal range of motion. Neck supple. No thyromegaly present.  Cardiovascular: Normal rate, regular rhythm, normal heart sounds and intact distal pulses.   No murmur heard. Pulmonary/Chest: Effort normal and breath sounds normal. No respiratory distress. She has no wheezes. She has no rales.  Abdominal: Soft. Bowel sounds are normal. She exhibits no distension and no mass. There is no tenderness.  Musculoskeletal: Normal range of motion. She exhibits no edema and no tenderness.  Lymphadenopathy:    She has no cervical adenopathy.  Neurological: She is alert and oriented to person, place, and time. She has normal reflexes. No cranial nerve deficit. Coordination normal.  Skin: Skin is warm and dry. No rash noted. She is not diaphoretic.  Psychiatric: Mood, memory and affect normal.    Lab Results  Component Value Date   TSH 1.513 07/21/2013   Lab Results  Component Value Date   WBC 6.2 01/12/2014   HGB 13.9 01/12/2014   HCT 41.9 01/12/2014   MCV 86.6 01/12/2014   PLT 238 01/12/2014   Lab Results  Component Value Date   CREATININE 0.90 01/12/2014   BUN 19 01/12/2014   NA 141  01/12/2014   K 3.9 01/12/2014   CL 101 01/12/2014   CO2 28 01/12/2014   Lab Results  Component Value Date   ALT 21 01/12/2014   AST 21 01/12/2014   ALKPHOS 81 01/12/2014   BILITOT 0.3 01/12/2014   Lab Results  Component Value Date   CHOL 186 07/21/2013   Lab Results  Component Value Date   HDL 53 07/21/2013   Lab Results  Component Value Date   LDLCALC 117* 07/21/2013   Lab Results  Component Value Date   TRIG 81 07/21/2013   Lab Results  Component Value Date   CHOLHDL 3.5 07/21/2013     Assessment & Plan  SLEEP APNEA Using CPAP routinely encouraged to return to pulmonology  Hyperglycemia wnl today. hgba1c acceptable, minimize simple carbs. Increase  exercise as tolerated.  HYPERLIPIDEMIA Encouraged heart healthy diet, increase exercise, avoid trans fats, consider a krill oil cap daily. Continue Crestor  Hiatal hernia with gastroesophageal reflux Avoid offending foods, start probiotics. Do not eat large meals in late evening and consider raising head of bed.   Preventative health care Patient encouraged to maintain heart healthy diet, regular exercise, adequate sleep. Consider daily probiotics. Take medications as prescribed. Declined flu shot and Prevnar today  Dysuria Urine culture negative, start probiotic and hydrate well, report worsening symptoms

## 2014-02-05 NOTE — Assessment & Plan Note (Signed)
Using CPAP routinely encouraged to return to pulmonology

## 2014-02-06 LAB — URINE CULTURE
Colony Count: NO GROWTH
ORGANISM ID, BACTERIA: NO GROWTH

## 2014-02-08 DIAGNOSIS — R300 Dysuria: Secondary | ICD-10-CM | POA: Insufficient documentation

## 2014-02-08 DIAGNOSIS — I10 Essential (primary) hypertension: Secondary | ICD-10-CM | POA: Insufficient documentation

## 2014-02-08 NOTE — Assessment & Plan Note (Signed)
wnl today. hgba1c acceptable, minimize simple carbs. Increase exercise as tolerated.

## 2014-02-08 NOTE — Assessment & Plan Note (Signed)
Patient encouraged to maintain heart healthy diet, regular exercise, adequate sleep. Consider daily probiotics. Take medications as prescribed. Declined flu shot and Prevnar today

## 2014-02-08 NOTE — Assessment & Plan Note (Signed)
Urine culture negative, start probiotic and hydrate well, report worsening symptoms

## 2014-02-08 NOTE — Assessment & Plan Note (Signed)
Avoid offending foods, start probiotics. Do not eat large meals in late evening and consider raising head of bed.  

## 2014-02-08 NOTE — Assessment & Plan Note (Addendum)
Encouraged heart healthy diet, increase exercise, avoid trans fats, consider a krill oil cap daily. Continue Crestor 

## 2014-02-23 ENCOUNTER — Encounter: Payer: Self-pay | Admitting: Family Medicine

## 2014-03-31 ENCOUNTER — Ambulatory Visit: Payer: BC Managed Care – PPO | Admitting: Physician Assistant

## 2014-03-31 ENCOUNTER — Encounter: Payer: Self-pay | Admitting: Physician Assistant

## 2014-03-31 ENCOUNTER — Ambulatory Visit (INDEPENDENT_AMBULATORY_CARE_PROVIDER_SITE_OTHER): Payer: BC Managed Care – PPO | Admitting: Physician Assistant

## 2014-03-31 ENCOUNTER — Other Ambulatory Visit: Payer: Self-pay | Admitting: Family Medicine

## 2014-03-31 VITALS — BP 122/52 | HR 74 | Temp 98.4°F | Resp 16 | Ht 62.0 in | Wt 175.2 lb

## 2014-03-31 DIAGNOSIS — L989 Disorder of the skin and subcutaneous tissue, unspecified: Secondary | ICD-10-CM | POA: Insufficient documentation

## 2014-03-31 DIAGNOSIS — M2560 Stiffness of unspecified joint, not elsewhere classified: Secondary | ICD-10-CM

## 2014-03-31 DIAGNOSIS — R21 Rash and other nonspecific skin eruption: Secondary | ICD-10-CM

## 2014-03-31 LAB — CBC
HCT: 38.7 % (ref 36.0–46.0)
Hemoglobin: 12.7 g/dL (ref 12.0–15.0)
MCHC: 32.8 g/dL (ref 30.0–36.0)
MCV: 85.9 fl (ref 78.0–100.0)
Platelets: 223 10*3/uL (ref 150.0–400.0)
RBC: 4.5 Mil/uL (ref 3.87–5.11)
RDW: 13.6 % (ref 11.5–15.5)
WBC: 4.7 10*3/uL (ref 4.0–10.5)

## 2014-03-31 LAB — SEDIMENTATION RATE: SED RATE: 34 mm/h — AB (ref 0–22)

## 2014-03-31 LAB — URIC ACID: URIC ACID, SERUM: 6 mg/dL (ref 2.4–7.0)

## 2014-03-31 LAB — RHEUMATOID FACTOR: Rhuematoid fact SerPl-aCnc: <10 IU/mL (ref <=14)

## 2014-03-31 MED ORDER — METHYLPREDNISOLONE (PAK) 4 MG PO TABS: ORAL_TABLET | ORAL | Status: DC

## 2014-03-31 NOTE — Assessment & Plan Note (Signed)
Possibly related to new fabric softener.  Also giving joint symptoms, some concern for AI etiology.  Workup in progress.  Will also add ANA.  Rx Medrol dose pack. Supportive measures discussed with patient.  Follow-up in 1 week.

## 2014-03-31 NOTE — Progress Notes (Signed)
Pre visit review using our clinic review tool, if applicable. No additional management support is needed unless otherwise documented below in the visit note/SLS  

## 2014-03-31 NOTE — Assessment & Plan Note (Signed)
Physical exam of wrist and hands unremarkable at present.  Will check CBC, ESR, Uric Acid and Rf.  Medrol pack given for rash will also likely be beneficial.  Follow-up in 1 week.

## 2014-03-31 NOTE — Progress Notes (Signed)
Patient presents to clinic today c/o rash of arms and legs bilaterally x 1 week.  Rash is associated with some stiffness and pain in wrists bilaterally.  Patient denies history of AI disorder. Denies trauma or injury.  Denies change to soaps or lotions, but thinks she has started a new fabric softener.  Denies recent travel or sick contact.  Denies fever or chills associated with arthralgias.  Does endorse joint stiffness.  Past Medical History  Diagnosis Date  . Chest pain   . Hyperlipidemia   . Sleep apnea   . GERD (gastroesophageal reflux disease)   . Obesity   . At risk for colon cancer     Last colonoscopy in [place] 03/2009 found hyperplastic polyp only  . Osteopenia 08/31/2012  . Left shoulder pain 11/14/2012  . Unspecified sinusitis (chronic) 03/30/2013  . Allergic state 03/25/2012  . Preventative health care 03/30/2013  . Acute sinusitis 03/30/2013  . Hiatal hernia with gastroesophageal reflux 04/27/2010    Qualifier: Diagnosis of  By: Danelle Earthly CMA, Darlene  Failed Omeprazole and Protonix     Current Outpatient Prescriptions on File Prior to Visit  Medication Sig Dispense Refill  . cetirizine (ZYRTEC) 10 MG tablet Take 1 tablet (10 mg total) by mouth daily as needed for allergies. 30 tablet 5  . CRESTOR 10 MG tablet TAKE 1 TABLET (10 MG TOTAL) BY MOUTH DAILY. 30 tablet 1  . fish oil-omega-3 fatty acids 1000 MG capsule Take 2 g by mouth daily.    . MULTIPLE VITAMINS PO Take by mouth daily.    . pantoprazole (PROTONIX) 40 MG tablet Take 1 tablet (40 mg total) by mouth daily. 30 tablet 3  . ranitidine (ZANTAC) 300 MG tablet Take 1 tablet (300 mg total) by mouth at bedtime. 30 tablet 2  . aspirin EC 81 MG tablet Take 81 mg by mouth every other day.    . sucralfate (CARAFATE) 1 GM/10ML suspension Take 10 mLs (1 g total) by mouth 4 (four) times daily -  with meals and at bedtime. 420 mL 1  . traMADol (ULTRAM) 50 MG tablet Take 1 tablet (50 mg total) by mouth every 6 (six) hours as needed. 15  tablet 0   No current facility-administered medications on file prior to visit.    No Known Allergies  Family History  Problem Relation Age of Onset  . Stroke Mother   . Lung cancer Father     History   Social History  . Marital Status: Married    Spouse Name: N/A    Number of Children: 2  . Years of Education: N/A   Occupational History  . Accountant    Social History Main Topics  . Smoking status: Never Smoker   . Smokeless tobacco: Never Used  . Alcohol Use: No  . Drug Use: No  . Sexual Activity: None   Other Topics Concern  . None   Social History Narrative    Review of Systems - See HPI.  All other ROS are negative.  BP 122/52 mmHg  Pulse 74  Temp(Src) 98.4 F (36.9 C) (Oral)  Resp 16  Ht '5\' 2"'  (1.575 m)  Wt 175 lb 4 oz (79.493 kg)  BMI 32.05 kg/m2  SpO2 98%  Physical Exam  Constitutional: She is oriented to person, place, and time and well-developed, well-nourished, and in no distress.  HENT:  Head: Normocephalic and atraumatic.  Eyes: Conjunctivae are normal.  Neck: Neck supple.  Cardiovascular: Normal rate, regular rhythm, normal heart  sounds and intact distal pulses.   Pulmonary/Chest: Effort normal and breath sounds normal. No respiratory distress. She has no wheezes. She has no rales. She exhibits no tenderness.  Musculoskeletal:       Right wrist: Normal.       Left wrist: Normal.       Right hand: Normal. Normal sensation noted. Normal strength noted.       Left hand: Normal. She exhibits normal range of motion and normal capillary refill. Normal sensation noted. Normal strength noted.  Neurological: She is alert and oriented to person, place, and time.  Skin: Skin is warm and dry. Rash noted. Rash is macular.  Psychiatric: Affect normal.  Vitals reviewed.   Recent Results (from the past 2160 hour(s))  Urinalysis, Routine w reflex microscopic     Status: Abnormal   Collection Time: 01/12/14  8:48 PM  Result Value Ref Range   Color,  Urine YELLOW YELLOW   APPearance CLEAR CLEAR   Specific Gravity, Urine 1.016 1.005 - 1.030   pH 5.5 5.0 - 8.0   Glucose, UA NEGATIVE NEGATIVE mg/dL   Hgb urine dipstick NEGATIVE NEGATIVE   Bilirubin Urine NEGATIVE NEGATIVE   Ketones, ur NEGATIVE NEGATIVE mg/dL   Protein, ur NEGATIVE NEGATIVE mg/dL   Urobilinogen, UA 0.2 0.0 - 1.0 mg/dL   Nitrite NEGATIVE NEGATIVE   Leukocytes, UA SMALL (A) NEGATIVE  Urine microscopic-add on     Status: Abnormal   Collection Time: 01/12/14  8:48 PM  Result Value Ref Range   Squamous Epithelial / LPF FEW (A) RARE   WBC, UA 0-2 <3 WBC/hpf   RBC / HPF 0-2 <3 RBC/hpf   Bacteria, UA RARE RARE  CBC with Differential     Status: None   Collection Time: 01/12/14  9:45 PM  Result Value Ref Range   WBC 6.2 4.0 - 10.5 K/uL   RBC 4.84 3.87 - 5.11 MIL/uL   Hemoglobin 13.9 12.0 - 15.0 g/dL   HCT 41.9 36.0 - 46.0 %   MCV 86.6 78.0 - 100.0 fL   MCH 28.7 26.0 - 34.0 pg   MCHC 33.2 30.0 - 36.0 g/dL   RDW 13.7 11.5 - 15.5 %   Platelets 238 150 - 400 K/uL   Neutrophils Relative % 43 43 - 77 %   Neutro Abs 2.6 1.7 - 7.7 K/uL   Lymphocytes Relative 44 12 - 46 %   Lymphs Abs 2.7 0.7 - 4.0 K/uL   Monocytes Relative 9 3 - 12 %   Monocytes Absolute 0.5 0.1 - 1.0 K/uL   Eosinophils Relative 4 0 - 5 %   Eosinophils Absolute 0.3 0.0 - 0.7 K/uL   Basophils Relative 1 0 - 1 %   Basophils Absolute 0.0 0.0 - 0.1 K/uL  Comprehensive metabolic panel     Status: Abnormal   Collection Time: 01/12/14  9:45 PM  Result Value Ref Range   Sodium 141 137 - 147 mEq/L   Potassium 3.9 3.7 - 5.3 mEq/L   Chloride 101 96 - 112 mEq/L   CO2 28 19 - 32 mEq/L   Glucose, Bld 100 (H) 70 - 99 mg/dL   BUN 19 6 - 23 mg/dL   Creatinine, Ser 0.90 0.50 - 1.10 mg/dL   Calcium 10.4 8.4 - 10.5 mg/dL   Total Protein 8.1 6.0 - 8.3 g/dL   Albumin 4.3 3.5 - 5.2 g/dL   AST 21 0 - 37 U/L   ALT 21 0 - 35 U/L  Alkaline Phosphatase 81 39 - 117 U/L   Total Bilirubin 0.3 0.3 - 1.2 mg/dL   GFR calc  non Af Amer 68 (L) >90 mL/min   GFR calc Af Amer 79 (L) >90 mL/min    Comment: (NOTE) The eGFR has been calculated using the CKD EPI equation. This calculation has not been validated in all clinical situations. eGFR's persistently <90 mL/min signify possible Chronic Kidney Disease.   Anion gap 12 5 - 15  Occult blood card to lab, stool Provider will collect     Status: None   Collection Time: 01/12/14 10:36 PM  Result Value Ref Range   Fecal Occult Bld NEGATIVE NEGATIVE  Urine culture     Status: None   Collection Time: 01/14/14 10:25 AM  Result Value Ref Range   Colony Count NO GROWTH    Organism ID, Bacteria NO GROWTH   Urine Culture     Status: None   Collection Time: 02/05/14  2:26 PM  Result Value Ref Range   Colony Count NO GROWTH    Organism ID, Bacteria NO GROWTH   Lipid Profile     Status: Abnormal   Collection Time: 02/05/14  2:26 PM  Result Value Ref Range   Cholesterol 222 (H) 0 - 200 mg/dL    Comment: ATP III Classification       Desirable:  < 200 mg/dL               Borderline High:  200 - 239 mg/dL          High:  > = 240 mg/dL   Triglycerides 116.0 0.0 - 149.0 mg/dL    Comment: Normal:  <150 mg/dLBorderline High:  150 - 199 mg/dL   HDL 46.00 >39.00 mg/dL   VLDL 23.2 0.0 - 40.0 mg/dL   LDL Cholesterol 153 (H) 0 - 99 mg/dL   Total CHOL/HDL Ratio 5     Comment:                Men          Women1/2 Average Risk     3.4          3.3Average Risk          5.0          4.42X Average Risk          9.6          7.13X Average Risk          15.0          11.0                       NonHDL 176.00     Comment: NOTE:  Non-HDL goal should be 30 mg/dL higher than patient's LDL goal (i.e. LDL goal of < 70 mg/dL, would have non-HDL goal of < 100 mg/dL)  Renal function panel     Status: None   Collection Time: 02/05/14  2:26 PM  Result Value Ref Range   Sodium 138 135 - 145 mEq/L   Potassium 3.8 3.5 - 5.1 mEq/L   Chloride 104 96 - 112 mEq/L   CO2 30 19 - 32 mEq/L   Calcium 9.7  8.4 - 10.5 mg/dL   Albumin 4.2 3.5 - 5.2 g/dL   BUN 16 6 - 23 mg/dL   Creatinine, Ser 0.7 0.4 - 1.2 mg/dL   Glucose, Bld 83 70 - 99 mg/dL   Phosphorus 3.4 2.3 - 4.6  mg/dL   GFR 84.88 >60.00 mL/min  Hepatic function panel     Status: None   Collection Time: 02/05/14  2:26 PM  Result Value Ref Range   Total Bilirubin 0.6 0.2 - 1.2 mg/dL   Bilirubin, Direct 0.0 0.0 - 0.3 mg/dL   Alkaline Phosphatase 70 39 - 117 U/L   AST 22 0 - 37 U/L   ALT 23 0 - 35 U/L   Total Protein 7.6 6.0 - 8.3 g/dL   Albumin 4.2 3.5 - 5.2 g/dL  TSH     Status: None   Collection Time: 02/05/14  2:26 PM  Result Value Ref Range   TSH 0.83 0.35 - 4.50 uIU/mL  CBC     Status: None   Collection Time: 02/05/14  2:26 PM  Result Value Ref Range   WBC 5.8 4.0 - 10.5 K/uL   RBC 4.54 3.87 - 5.11 Mil/uL   Platelets 226.0 150.0 - 400.0 K/uL   Hemoglobin 12.9 12.0 - 15.0 g/dL   HCT 39.0 36.0 - 46.0 %   MCV 86.0 78.0 - 100.0 fl   MCHC 33.0 30.0 - 36.0 g/dL   RDW 13.7 11.5 - 15.5 %  Urinalysis, Routine w reflex microscopic     Status: Abnormal   Collection Time: 02/05/14  2:26 PM  Result Value Ref Range   Color, Urine YELLOW Yellow;Lt. Yellow   APPearance CLEAR Clear   Specific Gravity, Urine <=1.005 (A) 1.000 - 1.030   pH 6.0 5.0 - 8.0   Total Protein, Urine NEGATIVE Negative   Urine Glucose NEGATIVE Negative   Ketones, ur NEGATIVE Negative   Bilirubin Urine NEGATIVE Negative   Hgb urine dipstick NEGATIVE Negative   Urobilinogen, UA 0.2 0.0 - 1.0   Leukocytes, UA SMALL (A) Negative   Nitrite NEGATIVE Negative   WBC, UA 3-6/hpf (A) 0-2/hpf   RBC / HPF 0-2/hpf 0-2/hpf   Squamous Epithelial / LPF Rare(0-4/hpf) Rare(0-4/hpf)    Comment: Rare transitional epithelials   Bacteria, UA Rare(<10/hpf) (A) None    Assessment/Plan: Joint stiffness Physical exam of wrist and hands unremarkable at present.  Will check CBC, ESR, Uric Acid and Rf.  Medrol pack given for rash will also likely be beneficial.  Follow-up in 1  week.  Rash and nonspecific skin eruption Possibly related to new fabric softener.  Also giving joint symptoms, some concern for AI etiology.  Workup in progress.  Will also add ANA.  Rx Medrol dose pack. Supportive measures discussed with patient.  Follow-up in 1 week.

## 2014-03-31 NOTE — Patient Instructions (Signed)
Please take steroid as directed.  Continue Zantac.  Tylenol for pain. Sarna lotion may also be beneficial for itch.  Avoid heat as it will worsen rash and itching.  Stop by the lab for blood work to further assess symptoms.  Follow-up in 1 week.

## 2014-04-01 ENCOUNTER — Telehealth: Payer: Self-pay | Admitting: Family Medicine

## 2014-04-01 LAB — ANA: Anti Nuclear Antibody(ANA): NEGATIVE

## 2014-04-01 NOTE — Telephone Encounter (Signed)
Pt notified. Pt agrees to try tablet first and if her heartburn symptoms are exacerbated to let us know and switch to the cream.

## 2014-04-01 NOTE — Telephone Encounter (Signed)
So for the rash, we are limited in medications to offer significant relief.  We can do a steroid cream instead of the tablet, but this will likely not offer much relief of wrist symptoms.  I would advise she attempt the steroid and then if heartburn symptoms were exacerbated, we can stop this medication and try the cream.  She should also make sure to take her heartburn medication as directed.

## 2014-04-01 NOTE — Telephone Encounter (Signed)
Caller name: Jaeleigh Relation to pt: self Call back number: [phone] Pharmacy:  Reason for call:   Patient states that cody prescribed her medrol and she has some concerns over the side effects and would like to talk to someone. Patient states that she already has heartburn and does not want this medication to give her more heartburn???

## 2014-04-07 ENCOUNTER — Encounter: Payer: Self-pay | Admitting: Family Medicine

## 2014-04-07 ENCOUNTER — Ambulatory Visit (INDEPENDENT_AMBULATORY_CARE_PROVIDER_SITE_OTHER): Payer: BC Managed Care – PPO | Admitting: Family Medicine

## 2014-04-07 ENCOUNTER — Ambulatory Visit (INDEPENDENT_AMBULATORY_CARE_PROVIDER_SITE_OTHER): Payer: BC Managed Care – PPO

## 2014-04-07 VITALS — BP 137/59 | HR 66 | Temp 98.5°F | Ht 62.0 in | Wt 175.4 lb

## 2014-04-07 DIAGNOSIS — I10 Essential (primary) hypertension: Secondary | ICD-10-CM

## 2014-04-07 DIAGNOSIS — Z23 Encounter for immunization: Secondary | ICD-10-CM

## 2014-04-07 NOTE — Progress Notes (Signed)
Pre visit review using our clinic review tool, if applicable. No additional management support is needed unless otherwise documented below in the visit note. 

## 2014-04-09 ENCOUNTER — Other Ambulatory Visit: Payer: Self-pay | Admitting: Family Medicine

## 2014-04-09 ENCOUNTER — Ambulatory Visit (HOSPITAL_BASED_OUTPATIENT_CLINIC_OR_DEPARTMENT_OTHER)
Admission: RE | Admit: 2014-04-09 | Discharge: 2014-04-09 | Disposition: A | Discharge status: Home / Self Care | Payer: BC Managed Care – PPO | Source: Ambulatory Visit | Attending: Family Medicine | Admitting: Family Medicine

## 2014-04-09 ENCOUNTER — Encounter: Payer: Self-pay | Admitting: Family Medicine

## 2014-04-09 ENCOUNTER — Ambulatory Visit (INDEPENDENT_AMBULATORY_CARE_PROVIDER_SITE_OTHER): Payer: BC Managed Care – PPO | Admitting: Family Medicine

## 2014-04-09 VITALS — BP 121/40 | HR 63 | Temp 98.5°F | Ht 62.0 in | Wt 173.4 lb

## 2014-04-09 DIAGNOSIS — M5489 Other dorsalgia: Secondary | ICD-10-CM

## 2014-04-09 DIAGNOSIS — K219 Gastro-esophageal reflux disease without esophagitis: Secondary | ICD-10-CM

## 2014-04-09 DIAGNOSIS — M545 Low back pain: Secondary | ICD-10-CM | POA: Insufficient documentation

## 2014-04-09 DIAGNOSIS — K449 Diaphragmatic hernia without obstruction or gangrene: Secondary | ICD-10-CM

## 2014-04-09 DIAGNOSIS — I700 Atherosclerosis of aorta: Secondary | ICD-10-CM

## 2014-04-09 DIAGNOSIS — R1030 Lower abdominal pain, unspecified: Secondary | ICD-10-CM

## 2014-04-09 DIAGNOSIS — I10 Essential (primary) hypertension: Secondary | ICD-10-CM

## 2014-04-09 DIAGNOSIS — M546 Pain in thoracic spine: Secondary | ICD-10-CM

## 2014-04-09 NOTE — Progress Notes (Signed)
Patient ID: Margaret [name], female   DOB: 1950/12/14, 63 y.o.   MRN: [id number] Margaret [name] [id number] June 15, 1950 04/09/2014      Progress Note-Follow Up  Subjective  Chief Complaint  Chief Complaint  Patient presents with  . Follow-up    1 week    HPI  Patient is a 63 year old female in today for routine medical care. Patient complaining of increased mid back pain. Denies injuries or fall. Also notes some increased heartburn but denies any recent GI illness or change in diet. Notes the redness and swelling in legs is better. Joint pain in general is improved. Denies CP/palp/SOB/HA/congestion/fevers or GU c/o. Taking meds as prescribed  Past Medical History  Diagnosis Date  . Chest pain   . Hyperlipidemia   . Sleep apnea   . GERD (gastroesophageal reflux disease)   . Obesity   . At risk for colon cancer     Last colonoscopy in [place] 03/2009 found hyperplastic polyp only  . Osteopenia 08/31/2012  . Left shoulder pain 11/14/2012  . Unspecified sinusitis (chronic) 03/30/2013  . Allergic state 03/25/2012  . Preventative health care 03/30/2013  . Acute sinusitis 03/30/2013  . Hiatal hernia with gastroesophageal reflux 04/27/2010    Qualifier: Diagnosis of  By: Danelle Earthly CMA, Darlene  Failed Omeprazole and Protonix     Past Surgical History  Procedure Laterality Date  . Dilation and curettage of uterus      Family History  Problem Relation Age of Onset  . Stroke Mother   . Lung cancer Father     History   Social History  . Marital Status: Married    Spouse Name: N/A    Number of Children: 2  . Years of Education: N/A   Occupational History  . Accountant    Social History Main Topics  . Smoking status: Never Smoker   . Smokeless tobacco: Never Used  . Alcohol Use: No  . Drug Use: No  . Sexual Activity: Not on file   Other Topics Concern  . Not on file   Social History Narrative    Current Outpatient Prescriptions on File Prior to Visit  Medication Sig  Dispense Refill  . aspirin EC 81 MG tablet Take 81 mg by mouth every other day.    . cetirizine (ZYRTEC) 10 MG tablet Take 1 tablet (10 mg total) by mouth daily as needed for allergies. 30 tablet 5  . CRESTOR 10 MG tablet TAKE 1 TABLET (10 MG TOTAL) BY MOUTH DAILY. 30 tablet 1  . fish oil-omega-3 fatty acids 1000 MG capsule Take 2 g by mouth daily.    . methylPREDNIsolone (MEDROL DOSPACK) 4 MG tablet follow package directions 21 tablet 0  . MULTIPLE VITAMINS PO Take by mouth daily.    . pantoprazole (PROTONIX) 40 MG tablet Take 1 tablet (40 mg total) by mouth daily. 30 tablet 3  . ranitidine (ZANTAC) 300 MG tablet Take 1 tablet (300 mg total) by mouth at bedtime. 30 tablet 2  . sucralfate (CARAFATE) 1 GM/10ML suspension Take 10 mLs (1 g total) by mouth 4 (four) times daily -  with meals and at bedtime. 420 mL 1  . traMADol (ULTRAM) 50 MG tablet Take 1 tablet (50 mg total) by mouth every 6 (six) hours as needed. 15 tablet 0   No current facility-administered medications on file prior to visit.    No Known Allergies  Review of Systems  Review of Systems  Constitutional: Negative for fever and malaise/fatigue.  HENT:  Negative for congestion.   Eyes: Negative for discharge.  Respiratory: Negative for shortness of breath.   Cardiovascular: Negative for chest pain, palpitations and leg swelling.  Gastrointestinal: Positive for heartburn and abdominal pain. Negative for nausea and diarrhea.  Genitourinary: Negative for dysuria.  Musculoskeletal: Positive for back pain. Negative for falls.  Skin: Negative for rash.  Neurological: Negative for loss of consciousness and headaches.  Endo/Heme/Allergies: Negative for polydipsia.  Psychiatric/Behavioral: Negative for depression and suicidal ideas. The patient is not nervous/anxious and does not have insomnia.     Objective  BP 121/40 mmHg  Pulse 63  Temp(Src) 98.5 F (36.9 C) (Oral)  Ht 5\' 2"  (1.575 m)  Wt 173 lb 6.4 oz (78.654 kg)  BMI  31.71 kg/m2  SpO2 98%  Physical Exam  Physical Exam  Constitutional: She is oriented to person, place, and time and well-developed, well-nourished, and in no distress. No distress.  HENT:  Head: Normocephalic and atraumatic.  Eyes: Conjunctivae are normal.  Neck: Neck supple. No thyromegaly present.  Cardiovascular: Normal rate, regular rhythm and normal heart sounds.   No murmur heard. Pulmonary/Chest: Effort normal and breath sounds normal. She has no wheezes.  Abdominal: She exhibits no distension and no mass.  Musculoskeletal: She exhibits no edema.  Lymphadenopathy:    She has no cervical adenopathy.  Neurological: She is alert and oriented to person, place, and time.  Skin: Skin is warm and dry. No rash noted. She is not diaphoretic.  Psychiatric: Memory, affect and judgment normal.    Lab Results  Component Value Date   TSH 0.83 02/05/2014   Lab Results  Component Value Date   WBC 4.7 03/31/2014   HGB 12.7 03/31/2014   HCT 38.7 03/31/2014   MCV 85.9 03/31/2014   PLT 223.0 03/31/2014   Lab Results  Component Value Date   CREATININE 0.7 02/05/2014   BUN 16 02/05/2014   NA 138 02/05/2014   K 3.8 02/05/2014   CL 104 02/05/2014   CO2 30 02/05/2014   Lab Results  Component Value Date   ALT 23 02/05/2014   AST 22 02/05/2014   ALKPHOS 70 02/05/2014   BILITOT 0.6 02/05/2014   Lab Results  Component Value Date   CHOL 222* 02/05/2014   Lab Results  Component Value Date   HDL 46.00 02/05/2014   Lab Results  Component Value Date   LDLCALC 153* 02/05/2014   Lab Results  Component Value Date   TRIG 116.0 02/05/2014   Lab Results  Component Value Date   CHOLHDL 5 02/05/2014     Assessment & Plan  Essential hypertension Well controlled, no changes to meds. Encouraged heart healthy diet such as the DASH diet and exercise as tolerated.   Atherosclerosis of aorta Diagnosed with xray will proceed with ultrasound to further investigate.  Back  pain Xray completed and referred to Physical Therapy, Encouraged moist heat and gentle stretching as tolerated. May try NSAIDs and prescription meds as directed and report if symptoms worsen or seek immediate care.

## 2014-04-09 NOTE — Progress Notes (Signed)
Pre visit review using our clinic review tool, if applicable. No additional management support is needed unless otherwise documented below in the visit note. 

## 2014-04-09 NOTE — Patient Instructions (Signed)
Salon Pas patches or gel    Back Pain, Adult Back pain is very common. The pain often gets better over time. The cause of back pain is usually not dangerous. Most people can learn to manage their back pain on their own.  HOME CARE   Stay active. Start with short walks on flat ground if you can. Try to walk farther each day.  Do not sit, drive, or stand in one place for more than 30 minutes. Do not stay in bed.  Do not avoid exercise or work. Activity can help your back heal faster.  Be careful when you bend or lift an object. Bend at your knees, keep the object close to you, and do not twist.  Sleep on a firm mattress. Lie on your side, and bend your knees. If you lie on your back, put a pillow under your knees.  Only take medicines as told by your doctor.  Put ice on the injured area.  Put ice in a plastic bag.  Place a towel between your skin and the bag.  Leave the ice on for 15-20 minutes, 03-04 times a day for the first 2 to 3 days. After that, you can switch between ice and heat packs.  Ask your doctor about back exercises or massage.  Avoid feeling anxious or stressed. Find good ways to deal with stress, such as exercise. GET HELP RIGHT AWAY IF:   Your pain does not go away with rest or medicine.  Your pain does not go away in 1 week.  You have new problems.  You do not feel well.  The pain spreads into your legs.  You cannot control when you poop (bowel movement) or pee (urinate).  Your arms or legs feel weak or lose feeling (numbness).  You feel sick to your stomach (nauseous) or throw up (vomit).  You have belly (abdominal) pain.  You feel like you may pass out (faint). MAKE SURE YOU:   Understand these instructions.  Will watch your condition.  Will get help right away if you are not doing well or get worse. Document Released: 11/01/2007 Document Revised: 08/07/2011 Document Reviewed: 09/16/2013 [facility] Patient Information 2015 [place], [place].  This information is not intended to replace advice given to you by your health care provider. Make sure you discuss any questions you have with your health care provider.

## 2014-04-11 ENCOUNTER — Ambulatory Visit (HOSPITAL_BASED_OUTPATIENT_CLINIC_OR_DEPARTMENT_OTHER): Admission: RE | Admit: 2014-04-11 | Payer: BC Managed Care – PPO | Source: Ambulatory Visit

## 2014-04-11 LAB — URINE CULTURE
Colony Count: NO GROWTH
Organism ID, Bacteria: NO GROWTH

## 2014-04-12 ENCOUNTER — Encounter: Payer: Self-pay | Admitting: Family Medicine

## 2014-04-12 DIAGNOSIS — I700 Atherosclerosis of aorta: Secondary | ICD-10-CM | POA: Insufficient documentation

## 2014-04-12 HISTORY — DX: Atherosclerosis of aorta: I70.0

## 2014-04-12 NOTE — Assessment & Plan Note (Signed)
Xray completed and referred to Physical Therapy, Encouraged moist heat and gentle stretching as tolerated. May try NSAIDs and prescription meds as directed and report if symptoms worsen or seek immediate care.

## 2014-04-12 NOTE — Assessment & Plan Note (Signed)
Avoid offending foods, start probiotics. Do not eat large meals in late evening and consider raising head of bed. Continue current meds for now and may need follow up with GI

## 2014-04-12 NOTE — Assessment & Plan Note (Signed)
Well controlled, no changes to meds. Encouraged heart healthy diet such as the DASH diet and exercise as tolerated.  °

## 2014-04-12 NOTE — Progress Notes (Signed)
pateint left without being seen

## 2014-04-12 NOTE — Assessment & Plan Note (Signed)
Diagnosed with xray will proceed with ultrasound to further investigate.

## 2014-06-23 ENCOUNTER — Other Ambulatory Visit: Payer: Self-pay | Admitting: Family Medicine

## 2014-06-23 NOTE — Telephone Encounter (Signed)
Last filled: 12/26/13 Amt: 30, 2  Last OV:  04/09/14  Med filled

## 2014-08-17 ENCOUNTER — Ambulatory Visit (INDEPENDENT_AMBULATORY_CARE_PROVIDER_SITE_OTHER): Payer: BLUE CROSS/BLUE SHIELD | Admitting: Family Medicine

## 2014-08-17 ENCOUNTER — Encounter: Payer: Self-pay | Admitting: Family Medicine

## 2014-08-17 VITALS — BP 130/80 | HR 72 | Temp 98.0°F | Resp 16 | Wt 171.2 lb

## 2014-08-17 DIAGNOSIS — M545 Low back pain: Secondary | ICD-10-CM | POA: Diagnosis not present

## 2014-08-17 DIAGNOSIS — K2970 Gastritis, unspecified, without bleeding: Secondary | ICD-10-CM

## 2014-08-17 DIAGNOSIS — I700 Atherosclerosis of aorta: Secondary | ICD-10-CM

## 2014-08-17 DIAGNOSIS — R0789 Other chest pain: Secondary | ICD-10-CM | POA: Diagnosis not present

## 2014-08-17 DIAGNOSIS — R739 Hyperglycemia, unspecified: Secondary | ICD-10-CM

## 2014-08-17 DIAGNOSIS — I10 Essential (primary) hypertension: Secondary | ICD-10-CM

## 2014-08-17 DIAGNOSIS — E782 Mixed hyperlipidemia: Secondary | ICD-10-CM

## 2014-08-17 DIAGNOSIS — K449 Diaphragmatic hernia without obstruction or gangrene: Secondary | ICD-10-CM

## 2014-08-17 DIAGNOSIS — G4730 Sleep apnea, unspecified: Secondary | ICD-10-CM

## 2014-08-17 DIAGNOSIS — M4120 Other idiopathic scoliosis, site unspecified: Secondary | ICD-10-CM

## 2014-08-17 DIAGNOSIS — K219 Gastro-esophageal reflux disease without esophagitis: Secondary | ICD-10-CM | POA: Diagnosis not present

## 2014-08-17 DIAGNOSIS — K2990 Gastroduodenitis, unspecified, without bleeding: Secondary | ICD-10-CM

## 2014-08-17 DIAGNOSIS — R109 Unspecified abdominal pain: Secondary | ICD-10-CM

## 2014-08-17 LAB — LIPID PANEL
CHOLESTEROL: 205 mg/dL — AB (ref 0–200)
HDL: 55.3 mg/dL (ref >39.00)
LDL CALC: 131 mg/dL — AB (ref 0–99)
NonHDL: 149.7
Total CHOL/HDL Ratio: 4
Triglycerides: 96 mg/dL (ref 0.0–149.0)
VLDL: 19.2 mg/dL (ref 0.0–40.0)

## 2014-08-17 LAB — COMPREHENSIVE METABOLIC PANEL
ALT: 19 U/L (ref 0–35)
AST: 19 U/L (ref 0–37)
Albumin: 4.5 g/dL (ref 3.5–5.2)
Alkaline Phosphatase: 90 U/L (ref 39–117)
BILIRUBIN TOTAL: 0.3 mg/dL (ref 0.2–1.2)
BUN: 16 mg/dL (ref 6–23)
CO2: 27 mEq/L (ref 19–32)
Calcium: 9.8 mg/dL (ref 8.4–10.5)
Chloride: 103 mEq/L (ref 96–112)
Creatinine, Ser: 0.72 mg/dL (ref 0.40–1.20)
GFR: 87.45 mL/min (ref >60.00)
Glucose, Bld: 98 mg/dL (ref 70–99)
Potassium: 3.8 mEq/L (ref 3.5–5.1)
SODIUM: 140 meq/L (ref 135–145)
Total Protein: 7.7 g/dL (ref 6.0–8.3)

## 2014-08-17 LAB — CBC
HCT: 40.7 % (ref 36.0–46.0)
Hemoglobin: 13.5 g/dL (ref 12.0–15.0)
MCHC: 33.2 g/dL (ref 30.0–36.0)
MCV: 85.3 fl (ref 78.0–100.0)
PLATELETS: 286 10*3/uL (ref 150.0–400.0)
RBC: 4.77 Mil/uL (ref 3.87–5.11)
RDW: 13.4 % (ref 11.5–15.5)
WBC: 6.5 10*3/uL (ref 4.0–10.5)

## 2014-08-17 LAB — TSH: TSH: 1.08 u[IU]/mL (ref 0.35–4.50)

## 2014-08-17 LAB — HEMOGLOBIN A1C: HEMOGLOBIN A1C: 6.2 % (ref 4.6–6.5)

## 2014-08-17 MED ORDER — FAMOTIDINE 40 MG PO TABS: 40.0000 mg | ORAL_TABLET | Freq: Every day | ORAL | Status: DC

## 2014-08-17 MED ORDER — PANTOPRAZOLE SODIUM 40 MG PO TBEC: 40.0000 mg | DELAYED_RELEASE_TABLET | Freq: Every day | ORAL | Status: DC

## 2014-08-17 NOTE — Progress Notes (Signed)
Margaret [name]  [id number] Aug 27, 1950 08/17/2014      Progress Note-Follow Up  Subjective  Chief Complaint  Chief Complaint  Patient presents with  . Cough    unproductive  . Generalized Body Aches    1 week  . Facial Pain    pt was taking mucinex  . Sore Throat    post nasal drainage    HPI  Patient is a 64 y.o. female in today for routine medical care. Patient is in today with numerous complaints. She's been feeling poorly for about a week. Reports worsening heartburn with dyspepsia, epigastric pain, chest tightness and sour fluid with sore throat in her mouth frequently. Has a history of a hiatal hernia with reflux and H. pylori but had been doing much better until about a week ago. Is struggling with back pain as well as shoulder pain and having trouble finding a comfortable position. No change in bowel habits, bloody or tarry stool. Denies CP/palp/SOB/HA/congestion/fevers/GU c/o. Taking meds as prescribed  Past Medical History  Diagnosis Date  . Chest pain   . Hyperlipidemia   . Sleep apnea   . GERD (gastroesophageal reflux disease)   . Obesity   . At risk for colon cancer     Last colonoscopy in [place] 03/2009 found hyperplastic polyp only  . Osteopenia 08/31/2012  . Left shoulder pain 11/14/2012  . Unspecified sinusitis (chronic) 03/30/2013  . Allergic state 03/25/2012  . Preventative health care 03/30/2013  . Acute sinusitis 03/30/2013  . Hiatal hernia with gastroesophageal reflux 04/27/2010    Qualifier: Diagnosis of  By: Danelle Earthly CMA, Darlene  Failed Omeprazole and Protonix   . Atherosclerosis of aorta 04/12/2014    Past Surgical History  Procedure Laterality Date  . Dilation and curettage of uterus      Family History  Problem Relation Age of Onset  . Stroke Mother   . Lung cancer Father     History   Social History  . Marital Status: Married    Spouse Name: N/A  . Number of Children: 2  . Years of Education: N/A   Occupational History  . Accountant     Social History Main Topics  . Smoking status: Never Smoker   . Smokeless tobacco: Never Used  . Alcohol Use: No  . Drug Use: No  . Sexual Activity: Not on file   Other Topics Concern  . Not on file   Social History Narrative    Current Outpatient Prescriptions on File Prior to Visit  Medication Sig Dispense Refill  . CRESTOR 10 MG tablet TAKE 1 TABLET (10 MG TOTAL) BY MOUTH DAILY. 30 tablet 1  . fish oil-omega-3 fatty acids 1000 MG capsule Take 2 g by mouth daily.    . MULTIPLE VITAMINS PO Take by mouth daily.    . ranitidine (ZANTAC) 300 MG tablet TAKE 1 TABLET (300 MG TOTAL) BY MOUTH AT BEDTIME. 30 tablet 2  . aspirin EC 81 MG tablet Take 81 mg by mouth every other day.    . cetirizine (ZYRTEC) 10 MG tablet Take 1 tablet (10 mg total) by mouth daily as needed for allergies. (Patient not taking: Reported on 08/17/2014) 30 tablet 5  . pantoprazole (PROTONIX) 40 MG tablet Take 1 tablet (40 mg total) by mouth daily. (Patient not taking: Reported on 08/17/2014) 30 tablet 3  . sucralfate (CARAFATE) 1 GM/10ML suspension Take 10 mLs (1 g total) by mouth 4 (four) times daily -  with meals and at bedtime. (Patient not  taking: Reported on 08/17/2014) 420 mL 1  . traMADol (ULTRAM) 50 MG tablet Take 1 tablet (50 mg total) by mouth every 6 (six) hours as needed. (Patient not taking: Reported on 08/17/2014) 15 tablet 0   No current facility-administered medications on file prior to visit.    No Known Allergies  Review of Systems  Review of Systems  Constitutional: Negative for fever and malaise/fatigue.  HENT: Positive for sore throat. Negative for congestion.   Eyes: Negative for discharge.  Respiratory: Positive for sputum production. Negative for shortness of breath.   Cardiovascular: Negative for chest pain, palpitations and leg swelling.  Gastrointestinal: Negative for nausea, abdominal pain and diarrhea.  Genitourinary: Negative for dysuria.  Musculoskeletal: Negative for falls.    Skin: Negative for rash.  Neurological: Negative for loss of consciousness and headaches.  Endo/Heme/Allergies: Negative for polydipsia.  Psychiatric/Behavioral: Negative for depression and suicidal ideas. The patient is not nervous/anxious and does not have insomnia.     Objective  BP 130/80 mmHg  Pulse 72  Temp(Src) 98 F (36.7 C) (Oral)  Resp 16  Wt 171 lb 4 oz (77.678 kg)  SpO2 96%  Physical Exam  Physical Exam  Constitutional: She is oriented to person, place, and time and well-developed, well-nourished, and in no distress. No distress.  HENT:  Head: Normocephalic and atraumatic.  Eyes: Conjunctivae are normal.  Neck: Neck supple. No thyromegaly present.  Cardiovascular: Normal rate, regular rhythm and normal heart sounds.   No murmur heard. Pulmonary/Chest: Effort normal and breath sounds normal. She has no wheezes.  Abdominal: She exhibits no distension and no mass.  Musculoskeletal: She exhibits no edema.  Lymphadenopathy:    She has no cervical adenopathy.  Neurological: She is alert and oriented to person, place, and time.  Skin: Skin is warm and dry. No rash noted. She is not diaphoretic.  Psychiatric: Memory, affect and judgment normal.    Lab Results  Component Value Date   TSH 0.83 02/05/2014   Lab Results  Component Value Date   WBC 4.7 03/31/2014   HGB 12.7 03/31/2014   HCT 38.7 03/31/2014   MCV 85.9 03/31/2014   PLT 223.0 03/31/2014   Lab Results  Component Value Date   CREATININE 0.7 02/05/2014   BUN 16 02/05/2014   NA 138 02/05/2014   K 3.8 02/05/2014   CL 104 02/05/2014   CO2 30 02/05/2014   Lab Results  Component Value Date   ALT 23 02/05/2014   AST 22 02/05/2014   ALKPHOS 70 02/05/2014   BILITOT 0.6 02/05/2014   Lab Results  Component Value Date   CHOL 222* 02/05/2014   Lab Results  Component Value Date   HDL 46.00 02/05/2014   Lab Results  Component Value Date   LDLCALC 153* 02/05/2014   Lab Results  Component Value  Date   TRIG 116.0 02/05/2014   Lab Results  Component Value Date   CHOLHDL 5 02/05/2014     Assessment & Plan  Essential hypertension Well controlled, no changes to meds. Encouraged heart healthy diet such as the DASH diet and exercise as tolerated.    Hiatal hernia with gastroesophageal reflux Symptoms worsening over past week. Will start on Protonix and Avoid offending foods, start probiotics. Do not eat large meals in late evening and consider raising head of bed. With history will have to return to gastroenterology for further evaluation   Atherosclerosis of aorta US shows no dilation in aortic aorta despite calcifications   Hyperlipidemia, mixed Tolerating  statin, encouraged heart healthy diet, avoid trans fats, minimize simple carbs and saturated fats. Increase exercise as tolerated   Back pain Encouraged moist heat and gentle stretching as tolerated. May try NSAIDs and prescription meds as directed and report if symptoms worsen or seek immediate care. Referred to sports med for further consideration.   Sleep apnea Using CPAP routinely   Hyperglycemia hgba1c acceptable, minimize simple carbs. Increase exercise as tolerated.

## 2014-08-17 NOTE — Progress Notes (Signed)
Pre visit review using our clinic review tool, if applicable. No additional management support is needed unless otherwise documented below in the visit note. 

## 2014-08-17 NOTE — Patient Instructions (Signed)

## 2014-08-19 ENCOUNTER — Encounter: Payer: Self-pay | Admitting: Physician Assistant

## 2014-08-20 ENCOUNTER — Ambulatory Visit (HOSPITAL_BASED_OUTPATIENT_CLINIC_OR_DEPARTMENT_OTHER)
Admission: RE | Admit: 2014-08-20 | Discharge: 2014-08-20 | Disposition: A | Discharge status: Home / Self Care | Payer: BLUE CROSS/BLUE SHIELD | Source: Ambulatory Visit | Attending: Family Medicine | Admitting: Family Medicine

## 2014-08-20 ENCOUNTER — Ambulatory Visit: Payer: BLUE CROSS/BLUE SHIELD | Admitting: Family Medicine

## 2014-08-20 DIAGNOSIS — R109 Unspecified abdominal pain: Secondary | ICD-10-CM

## 2014-08-20 DIAGNOSIS — I700 Atherosclerosis of aorta: Secondary | ICD-10-CM | POA: Diagnosis present

## 2014-08-27 ENCOUNTER — Ambulatory Visit: Payer: BLUE CROSS/BLUE SHIELD | Admitting: Family Medicine

## 2014-08-27 ENCOUNTER — Encounter: Payer: Self-pay | Admitting: Family Medicine

## 2014-08-27 DIAGNOSIS — I700 Atherosclerosis of aorta: Secondary | ICD-10-CM | POA: Insufficient documentation

## 2014-08-27 HISTORY — DX: Atherosclerosis of aorta: I70.0

## 2014-08-27 NOTE — Assessment & Plan Note (Signed)
Encouraged moist heat and gentle stretching as tolerated. May try NSAIDs and prescription meds as directed and report if symptoms worsen or seek immediate care. Referred to sports med for further consideration 

## 2014-08-27 NOTE — Assessment & Plan Note (Signed)
Well controlled, no changes to meds. Encouraged heart healthy diet such as the DASH diet and exercise as tolerated.  °

## 2014-08-27 NOTE — Assessment & Plan Note (Signed)
US shows no dilation in aortic aorta despite calcifications

## 2014-08-27 NOTE — Assessment & Plan Note (Signed)
hgba1c acceptable, minimize simple carbs. Increase exercise as tolerated.  

## 2014-08-27 NOTE — Assessment & Plan Note (Addendum)
Symptoms worsening over past week. Will start on Protonix and Avoid offending foods, start probiotics. Do not eat large meals in late evening and consider raising head of bed. With history will have to return to gastroenterology for further evaluation

## 2014-08-27 NOTE — Assessment & Plan Note (Signed)
Tolerating statin, encouraged heart healthy diet, avoid trans fats, minimize simple carbs and saturated fats. Increase exercise as tolerated 

## 2014-08-27 NOTE — Assessment & Plan Note (Signed)
Using CPAP routinely 

## 2014-09-03 ENCOUNTER — Encounter: Payer: Self-pay | Admitting: Physician Assistant

## 2014-09-03 ENCOUNTER — Ambulatory Visit (INDEPENDENT_AMBULATORY_CARE_PROVIDER_SITE_OTHER): Payer: BLUE CROSS/BLUE SHIELD | Admitting: Physician Assistant

## 2014-09-03 VITALS — BP 108/66 | HR 64 | Ht 61.5 in | Wt 173.0 lb

## 2014-09-03 DIAGNOSIS — K219 Gastro-esophageal reflux disease without esophagitis: Secondary | ICD-10-CM | POA: Diagnosis not present

## 2014-09-03 DIAGNOSIS — R0789 Other chest pain: Secondary | ICD-10-CM

## 2014-09-03 DIAGNOSIS — K449 Diaphragmatic hernia without obstruction or gangrene: Secondary | ICD-10-CM | POA: Diagnosis not present

## 2014-09-03 MED ORDER — PANTOPRAZOLE SODIUM 40 MG PO TBEC: DELAYED_RELEASE_TABLET | ORAL | Status: DC

## 2014-09-03 NOTE — Patient Instructions (Signed)
We sent refills  To [facility] [address], [facility] for the Pantoprazole Sodium 40 mg .  We made you a follow up appointment with Dr. Owens Loffler on 10-27-2014 at 1:30 PM.

## 2014-09-03 NOTE — Progress Notes (Signed)
Patient ID: Margaret [name], female   DOB: [birth year], 64 y.o.   MRN: [id number]   Subjective:    Patient ID: Margaret [name], female    DOB: Nov 04, 1950, 64 y.o.   MRN: [id number]  HPI [name] is a pleasant 64 year old female known to Dr. Ardis Hughs. She has history of chronic GERD. She was last seen in 2012. At that time she had undergone upper endoscopy which showed a small hiatal hernia gastritis and a submucosal lesion in the proximal stomach. Biopsies were done to rule out H. pylori and these were negative. 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 Patient has been on Protonix over the past 5 years. She comes in today for heartburn and indigestion. Patient states that she doesn't take the Protonix on a regular basis but rather as needed and has been taking Pepcid 40 mg in addition at bedtime. She says at this point she's been having daily symptoms for quite a while with heartburn indigestion and chest discomfort. She says this is worse postprandially and seems to be improved by getting up and moving around after eating rather than sitting. She has no complaints of dysphagia or odynophagia. She is also having frequent nighttime awakening with a choking sensation. She says she does not get sour brash but feels that something comes up in her throat and gives her a sensation of choking like she can't get her air. She has to get up and walk, around have something to drink etc. and this seems to settle down. She also has sleep apnea and uses a CPAP machine. Last colonoscopy was done in 2010 in [place] and she was found to have a hyperplastic polyp only.  Review of Systems Pertinent positive and negative review of systems were noted in the above HPI section.  All other review of systems was otherwise negative.  Outpatient Encounter Prescriptions as of 09/03/2014  Medication Sig  . aspirin EC 81 MG tablet Take 81 mg by  mouth every other day.  . cetirizine (ZYRTEC) 10 MG tablet Take 1 tablet (10 mg total) by mouth daily as needed for allergies.  Marland Kitchen CRESTOR 10 MG tablet TAKE 1 TABLET (10 MG TOTAL) BY MOUTH DAILY.  . famotidine (PEPCID) 40 MG tablet Take 1 tablet (40 mg total) by mouth daily.  . fish oil-omega-3 fatty acids 1000 MG capsule Take 2 g by mouth daily.  . MULTIPLE VITAMINS PO Take by mouth daily.  . pantoprazole (PROTONIX) 40 MG tablet Take 1 tab in the morning.  . [DISCONTINUED] pantoprazole (PROTONIX) 40 MG tablet Take 1 tablet (40 mg total) by mouth daily.  . sucralfate (CARAFATE) 1 GM/10ML suspension Take 10 mLs (1 g total) by mouth 4 (four) times daily -  with meals and at bedtime. (Patient not taking: Reported on 08/17/2014)  . traMADol (ULTRAM) 50 MG tablet Take 1 tablet (50 mg total) by mouth every 6 (six) hours as needed. (Patient not taking: Reported on 08/17/2014)   No Known Allergies Patient Active Problem List   Diagnosis Date Noted  . Atherosclerosis of aorta 04/12/2014  . Rash and nonspecific skin eruption 03/31/2014  . Joint stiffness 03/31/2014  . Dysuria 02/08/2014  . Essential hypertension 02/08/2014  . Atypical chest pain 01/21/2014  . Chest pain 07/27/2013  . Hyperglycemia 07/27/2013  . Acute sinusitis 03/30/2013  . Preventative health care 03/30/2013  . Left shoulder pain 11/14/2012  . Osteopenia 08/31/2012  . Back  pain 03/25/2012  . Knee pain 03/25/2012  . Allergic state 03/25/2012  . HELICOBACTER PYLORI INFECTION 06/08/2010  . Hiatal hernia with gastroesophageal reflux 04/27/2010  . Glucose intolerance 04/27/2010  . Hyperlipidemia, mixed 04/07/2010  . Sleep apnea 04/07/2010   History   Social History  . Marital Status: Married    Spouse Name: N/A  . Number of Children: 2  . Years of Education: N/A   Occupational History  . Accountant    Social History Main Topics  . Smoking status: Never Smoker   . Smokeless tobacco: Never Used  . Alcohol Use: No  .  Drug Use: No  . Sexual Activity: Not on file   Other Topics Concern  . Not on file   Social History Narrative    Ms. Mayeda family history includes Lung cancer in her father; Stroke in her mother.      Objective:    Filed Vitals:   09/03/14 0942  BP: 108/66  Pulse: 64    Physical Exam  well-developed older female in no acute distress, pleasant blood pressure 108/66 pulse 64 height 5 foot 1 weight 173. HEENT; nontraumatic normocephalic EOMI PERRLA sclera anicteric, Supple; no JVD, Cardiovascular ;regular rate and rhythm with S1-S2 no murmur or gallop, Pulmonary; clear bilaterally, Abdomen ;soft nontender nondistended bowel sounds are active there is no palpable mass or hepatosplenomegaly, Rectal; exam not done, Extremities; no clubbing cyanosis or edema skin warm and dry, Psych; mood and affect normal and appropriate       Assessment & Plan:   #1 64 yo female with chronic GERD,poorly controlled, and nocturnal episodes of choking sensation waking from sleep . #2 hx of hyperplastic polyps-2010 #3 HTN #4 OSA-CPAP  Plan; reviewed a strict antireflux regimen, including NPO for 3 hours before bed, and elevating HOB at least 45 degrees. Start taking Protonix 40 mg po Qm, and Pepcisd 40 mg q pm If this is not effective she will stop Pepcid, and increase PPI to BID Follow up with Dr Ardis Hughs in 4-6 weeks   Parul Porcelli Genia Harold PA-C 09/03/2014   Cc: Mosie Lukes, MD

## 2014-09-04 NOTE — Progress Notes (Signed)
i agree with the above note, plan 

## 2014-10-27 ENCOUNTER — Ambulatory Visit: Payer: BLUE CROSS/BLUE SHIELD | Admitting: Gastroenterology

## 2014-10-28 ENCOUNTER — Encounter: Payer: Self-pay | Admitting: Gastroenterology

## 2014-11-03 ENCOUNTER — Encounter: Payer: Self-pay | Admitting: Gastroenterology

## 2014-11-03 ENCOUNTER — Ambulatory Visit (INDEPENDENT_AMBULATORY_CARE_PROVIDER_SITE_OTHER): Payer: BLUE CROSS/BLUE SHIELD | Admitting: Gastroenterology

## 2014-11-03 VITALS — BP 132/74 | HR 62 | Ht 60.75 in | Wt 175.1 lb

## 2014-11-03 DIAGNOSIS — R0789 Other chest pain: Secondary | ICD-10-CM | POA: Diagnosis not present

## 2014-11-03 DIAGNOSIS — K219 Gastro-esophageal reflux disease without esophagitis: Secondary | ICD-10-CM

## 2014-11-03 DIAGNOSIS — K449 Diaphragmatic hernia without obstruction or gangrene: Secondary | ICD-10-CM

## 2014-11-03 MED ORDER — PANTOPRAZOLE SODIUM 40 MG PO TBEC: 40.0000 mg | DELAYED_RELEASE_TABLET | Freq: Two times a day (BID) | ORAL | Status: DC

## 2014-11-03 NOTE — Progress Notes (Signed)
Review of pertinent gastrointestinal problems: 1. Routine risk for colon cancer, last colonoscopy in [place] 11 2010 found a hyperplastic polyp only. Was recommended for repeat colonoscopy at 10 year interval. 2. Esophageal duplication cyst; noted by EGD Dr. Ardis Hughs 2012 (submucosal bulge), then underwent EUS Dr. 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  3. Mild gastritis: EGD Dr. Ardis Hughs 06/2010 Mild gastritis, biopsies showed no H. Pylori.  Small HH noted, Small submucosal lesion noted, see above.  HPI: This is a   very pleasant 64 year old woman. She was here in our office 2 months ago and saw Amy  Chief complaint is GERD  Has been taking protonix before BF meal daily (about an hour before). Takes pepcid at bedtime nightly.  Since changing to that regimen, she has noticed improvement but not perfectly normal.  She will still feel choking sensation at night. Chest pains.  Overall her weight has been going up.  Rare caffeine.  She has intermittent dypshagia to food, pills at times as well. This is not progressive.   Past Medical History  Diagnosis Date  . Chest pain   . Hyperlipidemia   . Sleep apnea   . GERD (gastroesophageal reflux disease)   . Obesity   . At risk for colon cancer     Last colonoscopy in [place] 03/2009 found hyperplastic polyp only  . Osteopenia 08/31/2012  . Left shoulder pain 11/14/2012  . Unspecified sinusitis (chronic) 03/30/2013  . Allergic state 03/25/2012  . Preventative health care 03/30/2013  . Acute sinusitis 03/30/2013  . Hiatal hernia with gastroesophageal reflux 04/27/2010    Qualifier: Diagnosis of  By: Danelle Earthly CMA, Darlene  Failed Omeprazole and Protonix   . Atherosclerosis of aorta 04/12/2014  . Aortic calcification 08/27/2014    Past Surgical History  Procedure Laterality Date  . Dilation and curettage of uterus      Current Outpatient Prescriptions  Medication Sig Dispense Refill  . cetirizine (ZYRTEC) 10 MG  tablet Take 1 tablet (10 mg total) by mouth daily as needed for allergies. 30 tablet 5  . CRESTOR 10 MG tablet TAKE 1 TABLET (10 MG TOTAL) BY MOUTH DAILY. 30 tablet 1  . famotidine (PEPCID) 40 MG tablet Take 1 tablet (40 mg total) by mouth daily. 30 tablet 3  . fish oil-omega-3 fatty acids 1000 MG capsule Take 2 g by mouth daily.    . MULTIPLE VITAMINS PO Take by mouth daily.    . pantoprazole (PROTONIX) 40 MG tablet Take 1 tab in the morning. 30 tablet 11  . aspirin EC 81 MG tablet Take 81 mg by mouth every other day.     No current facility-administered medications for this visit.    Allergies as of 11/03/2014  . (No Known Allergies)    Family History  Problem Relation Age of Onset  . Stroke Mother   . Lung cancer Father   . Stroke Sister     History   Social History  . Marital Status: Married    Spouse Name: N/A  . Number of Children: 2  . Years of Education: N/A   Occupational History  . Accountant    Social History Main Topics  . Smoking status: Never Smoker   . Smokeless tobacco: Never Used  . Alcohol Use: No  . Drug Use: No  . Sexual Activity: Not on file   Other Topics Concern  . Not on file   Social History Narrative     Physical Exam:  BP 132/74 mmHg  Pulse 62  Ht 5' 0.75" (1.543 m)  Wt 175 lb 2 oz (79.436 kg)  BMI 33.36 kg/m2 Constitutional: generally well-appearing Psychiatric: alert and oriented x3 Abdomen: soft, nontender, nondistended, no obvious ascites, no peritoneal signs, normal bowel sounds   Assessment and plan: 64 y.o. female with GERD, GERD-like chest pains  Her symptoms have improved with more regular use of proton pump inhibitor and bedtime H2 blocker. She is still periodically bothered however and I recommended she increase her antiacid medicines by increasing the proton pump inhibitor to twice daily, 20-30 minutes before breakfast and dinner meal. She will continue H2 blocker nightly at bedtime. She will call to report on her  response to this change in 5-6 weeks and sooner if needed.   Owens Loffler, MD [facility] 11/03/2014, 10:37 AM

## 2014-11-03 NOTE — Patient Instructions (Addendum)
Please increase your protonix to one pill twice a day.  These are best to take 20-30 min before meals (BF and dinner meals). Continue pepcid at bedtime every night. Call to report on your response in 5-6 weeks.

## 2014-11-08 ENCOUNTER — Other Ambulatory Visit: Payer: Self-pay | Admitting: Family Medicine

## 2014-12-04 ENCOUNTER — Encounter: Payer: Self-pay | Admitting: Family Medicine

## 2014-12-04 ENCOUNTER — Ambulatory Visit (INDEPENDENT_AMBULATORY_CARE_PROVIDER_SITE_OTHER): Payer: BLUE CROSS/BLUE SHIELD | Admitting: Family Medicine

## 2014-12-04 VITALS — BP 140/59 | HR 83 | Temp 98.5°F | Ht 62.0 in | Wt 176.1 lb

## 2014-12-04 DIAGNOSIS — G4730 Sleep apnea, unspecified: Secondary | ICD-10-CM | POA: Diagnosis not present

## 2014-12-04 DIAGNOSIS — R0789 Other chest pain: Secondary | ICD-10-CM | POA: Diagnosis not present

## 2014-12-04 DIAGNOSIS — E782 Mixed hyperlipidemia: Secondary | ICD-10-CM

## 2014-12-04 DIAGNOSIS — R609 Edema, unspecified: Secondary | ICD-10-CM

## 2014-12-04 DIAGNOSIS — E785 Hyperlipidemia, unspecified: Secondary | ICD-10-CM

## 2014-12-04 DIAGNOSIS — R600 Localized edema: Secondary | ICD-10-CM

## 2014-12-04 DIAGNOSIS — K219 Gastro-esophageal reflux disease without esophagitis: Secondary | ICD-10-CM | POA: Diagnosis not present

## 2014-12-04 DIAGNOSIS — M545 Low back pain: Secondary | ICD-10-CM | POA: Diagnosis not present

## 2014-12-04 DIAGNOSIS — T7840XA Allergy, unspecified, initial encounter: Secondary | ICD-10-CM | POA: Diagnosis not present

## 2014-12-04 DIAGNOSIS — I10 Essential (primary) hypertension: Secondary | ICD-10-CM

## 2014-12-04 LAB — URINALYSIS, ROUTINE W REFLEX MICROSCOPIC
Bilirubin Urine: NEGATIVE
Hgb urine dipstick: NEGATIVE
KETONES UR: NEGATIVE
Nitrite: NEGATIVE
Total Protein, Urine: NEGATIVE
URINE GLUCOSE: NEGATIVE
Urobilinogen, UA: 0.2 (ref 0.0–1.0)
pH: 6.5 (ref 5.0–8.0)

## 2014-12-04 MED ORDER — FUROSEMIDE 20 MG PO TABS: 20.0000 mg | ORAL_TABLET | Freq: Every day | ORAL | Status: DC

## 2014-12-04 MED ORDER — ROSUVASTATIN CALCIUM 10 MG PO TABS: ORAL_TABLET | ORAL | Status: DC

## 2014-12-04 MED ORDER — CYCLOBENZAPRINE HCL 10 MG PO TABS: 10.0000 mg | ORAL_TABLET | Freq: Every evening | ORAL | Status: DC | PRN

## 2014-12-04 MED ORDER — CETIRIZINE HCL 10 MG PO TABS
10.0000 mg | ORAL_TABLET | Freq: Every day | ORAL | Status: DC | PRN
Start: 2014-12-04 — End: 2015-05-06

## 2014-12-04 NOTE — Progress Notes (Signed)
Pre visit review using our clinic review tool, if applicable. No additional management support is needed unless otherwise documented below in the visit note. 

## 2014-12-04 NOTE — Patient Instructions (Signed)
[employer] makes a 10 strain probiotic Online at [email or web address] Compression hose by [employer] stockings light weight 10 to 20 mmHG knee hi on in am off in pm   DASH Eating Plan DASH stands for "Dietary Approaches to Stop Hypertension." The DASH eating plan is a healthy eating plan that has been shown to reduce high blood pressure (hypertension). Additional health benefits may include reducing the risk of type 2 diabetes mellitus, heart disease, and stroke. The DASH eating plan may also help with weight loss. WHAT DO I NEED TO KNOW ABOUT THE DASH EATING PLAN? For the DASH eating plan, you will follow these general guidelines:  Choose foods with a percent daily value for sodium of less than 5% (as listed on the food label).  Use salt-free seasonings or herbs instead of table salt or sea salt.  Check with your health care provider or pharmacist before using salt substitutes.  Eat lower-sodium products, often labeled as "lower sodium" or "no salt added."  Eat fresh foods.  Eat more vegetables, fruits, and low-fat dairy products.  Choose whole grains. Look for the word "whole" as the first word in the ingredient list.  Choose fish and skinless chicken or [place] more often than red meat. Limit fish, poultry, and meat to 6 oz (170 g) each day.  Limit sweets, desserts, sugars, and sugary drinks.  Choose heart-healthy fats.  Limit cheese to 1 oz (28 g) per day.  Eat more home-cooked food and less restaurant, buffet, and fast food.  Limit fried foods.  Cook foods using methods other than frying.  Limit canned vegetables. If you do use them, rinse them well to decrease the sodium.  When eating at a restaurant, ask that your food be prepared with less salt, or no salt if possible. WHAT FOODS CAN I EAT? Seek help from a dietitian for individual calorie needs. Grains Whole grain or whole wheat bread. Brown rice. Whole grain or whole wheat pasta. Quinoa, bulgur, and whole grain cereals.  Low-sodium cereals. Corn or whole wheat flour tortillas. Whole grain cornbread. Whole grain crackers. Low-sodium crackers. Vegetables Fresh or frozen vegetables (raw, steamed, roasted, or grilled). Low-sodium or reduced-sodium tomato and vegetable juices. Low-sodium or reduced-sodium tomato sauce and paste. Low-sodium or reduced-sodium canned vegetables.  Fruits All fresh, canned (in natural juice), or frozen fruits. Meat and Other Protein Products Ground beef (85% or leaner), grass-fed beef, or beef trimmed of fat. Skinless chicken or [place]. Ground chicken or [place]. Pork trimmed of fat. All fish and seafood. Eggs. Dried beans, peas, or lentils. Unsalted nuts and seeds. Unsalted canned beans. Dairy Low-fat dairy products, such as skim or 1% milk, 2% or reduced-fat cheeses, low-fat ricotta or cottage cheese, or plain low-fat yogurt. Low-sodium or reduced-sodium cheeses. Fats and Oils Tub margarines without trans fats. Light or reduced-fat mayonnaise and salad dressings (reduced sodium). Avocado. Safflower, olive, or canola oils. Natural peanut or almond butter. Other Unsalted popcorn and pretzels. The items listed above may not be a complete list of recommended foods or beverages. Contact your dietitian for more options. WHAT FOODS ARE NOT RECOMMENDED? Grains White bread. White pasta. White rice. Refined cornbread. Bagels and croissants. Crackers that contain trans fat. Vegetables Creamed or fried vegetables. Vegetables in a cheese sauce. Regular canned vegetables. Regular canned tomato sauce and paste. Regular tomato and vegetable juices. Fruits Dried fruits. Canned fruit in light or heavy syrup. Fruit juice. Meat and Other Protein Products Fatty cuts of meat. Ribs, chicken wings, bacon, sausage, bologna, salami, chitterlings,  fatback, hot dogs, bratwurst, and packaged luncheon meats. Salted nuts and seeds. Canned beans with salt. Dairy Whole or 2% milk, cream, half-and-half, and cream  cheese. Whole-fat or sweetened yogurt. Full-fat cheeses or blue cheese. Nondairy creamers and whipped toppings. Processed cheese, cheese spreads, or cheese curds. Condiments Onion and garlic salt, seasoned salt, table salt, and sea salt. Canned and packaged gravies. Worcestershire sauce. Tartar sauce. Barbecue sauce. Teriyaki sauce. Soy sauce, including reduced sodium. Steak sauce. Fish sauce. Oyster sauce. Cocktail sauce. Horseradish. Ketchup and mustard. Meat flavorings and tenderizers. Bouillon cubes. Hot sauce. Tabasco sauce. Marinades. Taco seasonings. Relishes. Fats and Oils Butter, stick margarine, lard, shortening, ghee, and bacon fat. Coconut, palm kernel, or palm oils. Regular salad dressings. Other Pickles and olives. Salted popcorn and pretzels. The items listed above may not be a complete list of foods and beverages to avoid. Contact your dietitian for more information. WHERE CAN I FIND MORE INFORMATION? National Heart, Lung, and Blood Institute: [employer] Document Released: 05/04/2011 Document Revised: 09/29/2013 Document Reviewed: 03/19/2013 [facility] Patient Information 2015 [place], [place]. This information is not intended to replace advice given to you by your health care provider. Make sure you discuss any questions you have with your health care provider.

## 2014-12-05 LAB — URINE CULTURE: Colony Count: 50000

## 2014-12-09 ENCOUNTER — Ambulatory Visit (HOSPITAL_BASED_OUTPATIENT_CLINIC_OR_DEPARTMENT_OTHER)
Admission: RE | Admit: 2014-12-09 | Discharge: 2014-12-09 | Disposition: A | Discharge status: Home / Self Care | Payer: BLUE CROSS/BLUE SHIELD | Source: Ambulatory Visit | Attending: Family Medicine | Admitting: Family Medicine

## 2014-12-09 DIAGNOSIS — E785 Hyperlipidemia, unspecified: Secondary | ICD-10-CM | POA: Insufficient documentation

## 2014-12-09 DIAGNOSIS — R079 Chest pain, unspecified: Secondary | ICD-10-CM | POA: Insufficient documentation

## 2014-12-09 DIAGNOSIS — R600 Localized edema: Secondary | ICD-10-CM | POA: Diagnosis not present

## 2014-12-09 DIAGNOSIS — R0789 Other chest pain: Secondary | ICD-10-CM

## 2014-12-09 NOTE — Progress Notes (Signed)
  Echocardiogram 2D Echocardiogram has been performed.  Jennette Dubin 12/09/2014, 10:03 AM

## 2014-12-13 ENCOUNTER — Encounter: Payer: Self-pay | Admitting: Family Medicine

## 2014-12-13 DIAGNOSIS — R609 Edema, unspecified: Secondary | ICD-10-CM

## 2014-12-13 DIAGNOSIS — R600 Localized edema: Secondary | ICD-10-CM

## 2014-12-13 HISTORY — DX: Edema, unspecified: R60.9

## 2014-12-13 HISTORY — DX: Localized edema: R60.0

## 2014-12-13 NOTE — Assessment & Plan Note (Signed)
None today, encouraged to minimize sodium, elevate legs and consider compression hose prn

## 2014-12-13 NOTE — Assessment & Plan Note (Signed)
Tolerating statin, encouraged heart healthy diet, avoid trans fats, minimize simple carbs and saturated fats. Increase exercise as tolerated 

## 2014-12-13 NOTE — Assessment & Plan Note (Signed)
Is referred to pulmonology for further testing and management.

## 2014-12-13 NOTE — Assessment & Plan Note (Signed)
minimize simple carbs. Increase exercise as tolerated.  

## 2014-12-13 NOTE — Progress Notes (Signed)
Margaret [name]  [id number] 04-12-1951 12/13/2014      Progress Note-Follow Up  Subjective  Chief Complaint  Chief Complaint  Patient presents with  . Leg Swelling    HPI  Patient is a 64 y.o. female in today for routine medical care. Patient is in today for follow-up. She is generally doing well but complains of some persistent fatigue and some intermittent peripheral edema. She reports the edema is been occurring off and on around her ankles for about 2 months now no other acute illness or complaint. Denies CP/palp/SOB/HA/congestion/fevers/GI or GU c/o. Taking meds as prescribed  Past Medical History  Diagnosis Date  . Chest pain   . Hyperlipidemia   . Sleep apnea   . GERD (gastroesophageal reflux disease)   . Obesity   . At risk for colon cancer     Last colonoscopy in [place] 03/2009 found hyperplastic polyp only  . Osteopenia 08/31/2012  . Left shoulder pain 11/14/2012  . Unspecified sinusitis (chronic) 03/30/2013  . Allergic state 03/25/2012  . Preventative health care 03/30/2013  . Acute sinusitis 03/30/2013  . Hiatal hernia with gastroesophageal reflux 04/27/2010    Qualifier: Diagnosis of  By: Danelle Earthly CMA, Darlene  Failed Omeprazole and Protonix   . Atherosclerosis of aorta 04/12/2014  . Aortic calcification 08/27/2014  . Peripheral edema 12/13/2014    Past Surgical History  Procedure Laterality Date  . Dilation and curettage of uterus      Family History  Problem Relation Age of Onset  . Stroke Mother   . Lung cancer Father   . Stroke Sister     History   Social History  . Marital Status: Married    Spouse Name: N/A  . Number of Children: 2  . Years of Education: N/A   Occupational History  . Accountant    Social History Main Topics  . Smoking status: Never Smoker   . Smokeless tobacco: Never Used  . Alcohol Use: No  . Drug Use: No  . Sexual Activity: Not on file   Other Topics Concern  . Not on file   Social History Narrative    Current  Outpatient Prescriptions on File Prior to Visit  Medication Sig Dispense Refill  . famotidine (PEPCID) 40 MG tablet Take 1 tablet (40 mg total) by mouth daily. 30 tablet 3  . fish oil-omega-3 fatty acids 1000 MG capsule Take 2 g by mouth daily.    . MULTIPLE VITAMINS PO Take by mouth daily.    . pantoprazole (PROTONIX) 40 MG tablet Take 1 tablet (40 mg total) by mouth 2 (two) times daily before a meal. Take 1 tab in the morning. 60 tablet 11  . aspirin EC 81 MG tablet Take 81 mg by mouth every other day.     No current facility-administered medications on file prior to visit.    No Known Allergies  Review of Systems  Review of Systems  Constitutional: Positive for malaise/fatigue. Negative for fever.  HENT: Negative for congestion.   Eyes: Negative for discharge.  Respiratory: Positive for cough. Negative for shortness of breath.   Cardiovascular: Positive for leg swelling. Negative for chest pain and palpitations.  Gastrointestinal: Negative for nausea, abdominal pain and diarrhea.  Genitourinary: Positive for urgency. Negative for dysuria.  Musculoskeletal: Negative for falls.  Skin: Negative for rash.  Neurological: Negative for loss of consciousness and headaches.  Endo/Heme/Allergies: Negative for polydipsia.  Psychiatric/Behavioral: Negative for depression and suicidal ideas. The patient is not nervous/anxious and does not  have insomnia.     Objective  BP 140/59 mmHg  Pulse 83  Temp(Src) 98.5 F (36.9 C) (Oral)  Ht 5\' 2"  (1.575 m)  Wt 176 lb 2 oz (79.89 kg)  BMI 32.21 kg/m2  SpO2 99%  Physical Exam  Physical Exam  Constitutional: She is oriented to person, place, and time and well-developed, well-nourished, and in no distress. No distress.  HENT:  Head: Normocephalic and atraumatic.  Eyes: Conjunctivae are normal.  Neck: Neck supple. No thyromegaly present.  Cardiovascular: Normal rate, regular rhythm and normal heart sounds.   No murmur  heard. Pulmonary/Chest: Effort normal and breath sounds normal. She has no wheezes.  Abdominal: She exhibits no distension and no mass.  Musculoskeletal: She exhibits no edema.  Lymphadenopathy:    She has no cervical adenopathy.  Neurological: She is alert and oriented to person, place, and time.  Skin: Skin is warm and dry. No rash noted. She is not diaphoretic.  Psychiatric: Memory, affect and judgment normal.    Lab Results  Component Value Date   TSH 1.08 08/17/2014   Lab Results  Component Value Date   WBC 6.5 08/17/2014   HGB 13.5 08/17/2014   HCT 40.7 08/17/2014   MCV 85.3 08/17/2014   PLT 286.0 08/17/2014   Lab Results  Component Value Date   CREATININE 0.72 08/17/2014   BUN 16 08/17/2014   NA 140 08/17/2014   K 3.8 08/17/2014   CL 103 08/17/2014   CO2 27 08/17/2014   Lab Results  Component Value Date   ALT 19 08/17/2014   AST 19 08/17/2014   ALKPHOS 90 08/17/2014   BILITOT 0.3 08/17/2014   Lab Results  Component Value Date   CHOL 205* 08/17/2014   Lab Results  Component Value Date   HDL 55.30 08/17/2014   Lab Results  Component Value Date   LDLCALC 131* 08/17/2014   Lab Results  Component Value Date   TRIG 96.0 08/17/2014   Lab Results  Component Value Date   CHOLHDL 4 08/17/2014     Assessment & Plan  Essential hypertension Well controlled, no changes to meds. Encouraged heart healthy diet such as the DASH diet and exercise as tolerated.   Glucose intolerance  minimize simple carbs. Increase exercise as tolerated.   Hyperlipidemia, mixed Tolerating statin, encouraged heart healthy diet, avoid trans fats, minimize simple carbs and saturated fats. Increase exercise as tolerated  Sleep apnea Is referred to pulmonology for further testing and management.  Peripheral edema None today, encouraged to minimize sodium, elevate legs and consider compression hose prn

## 2014-12-13 NOTE — Assessment & Plan Note (Signed)
Well controlled, no changes to meds. Encouraged heart healthy diet such as the DASH diet and exercise as tolerated.  °

## 2014-12-23 ENCOUNTER — Other Ambulatory Visit: Payer: Self-pay | Admitting: Family Medicine

## 2015-01-05 ENCOUNTER — Other Ambulatory Visit: Payer: Self-pay | Admitting: Family Medicine

## 2015-01-06 ENCOUNTER — Encounter (HOSPITAL_BASED_OUTPATIENT_CLINIC_OR_DEPARTMENT_OTHER): Payer: Self-pay

## 2015-01-06 ENCOUNTER — Emergency Department (HOSPITAL_BASED_OUTPATIENT_CLINIC_OR_DEPARTMENT_OTHER): Payer: BLUE CROSS/BLUE SHIELD

## 2015-01-06 ENCOUNTER — Emergency Department (HOSPITAL_BASED_OUTPATIENT_CLINIC_OR_DEPARTMENT_OTHER)
Admission: EM | Admit: 2015-01-06 | Discharge: 2015-01-06 | Disposition: A | Discharge status: Home / Self Care | Payer: BLUE CROSS/BLUE SHIELD | Attending: Emergency Medicine | Admitting: Emergency Medicine

## 2015-01-06 DIAGNOSIS — Z8669 Personal history of other diseases of the nervous system and sense organs: Secondary | ICD-10-CM | POA: Diagnosis not present

## 2015-01-06 DIAGNOSIS — E785 Hyperlipidemia, unspecified: Secondary | ICD-10-CM | POA: Insufficient documentation

## 2015-01-06 DIAGNOSIS — Z79899 Other long term (current) drug therapy: Secondary | ICD-10-CM | POA: Diagnosis not present

## 2015-01-06 DIAGNOSIS — K219 Gastro-esophageal reflux disease without esophagitis: Secondary | ICD-10-CM | POA: Diagnosis not present

## 2015-01-06 DIAGNOSIS — M542 Cervicalgia: Secondary | ICD-10-CM | POA: Insufficient documentation

## 2015-01-06 DIAGNOSIS — Z7982 Long term (current) use of aspirin: Secondary | ICD-10-CM | POA: Insufficient documentation

## 2015-01-06 DIAGNOSIS — Z8679 Personal history of other diseases of the circulatory system: Secondary | ICD-10-CM | POA: Diagnosis not present

## 2015-01-06 DIAGNOSIS — E669 Obesity, unspecified: Secondary | ICD-10-CM | POA: Diagnosis not present

## 2015-01-06 DIAGNOSIS — Z8709 Personal history of other diseases of the respiratory system: Secondary | ICD-10-CM | POA: Diagnosis not present

## 2015-01-06 DIAGNOSIS — R200 Anesthesia of skin: Secondary | ICD-10-CM | POA: Diagnosis present

## 2015-01-06 MED ORDER — IBUPROFEN 400 MG PO TABS
600.0000 mg | ORAL_TABLET | Freq: Once | ORAL | Status: AC
  Administered 2015-01-06: 600 mg via ORAL
  Filled 2015-01-06 (×2): qty 1

## 2015-01-06 NOTE — ED Notes (Signed)
MD at bedside. 

## 2015-01-06 NOTE — Discharge Instructions (Signed)
You have neck pain, possibly from a cervical strain and/or pinched nerve.   SEEK IMMEDIATE MEDICAL ATTENTION IF: You develop difficulties swallowing or breathing.  You have new or worse numbness, weakness, tingling, or movement problems in your arms or legs.  You develop increasing pain which is uncontrolled with medications.  You have change in bowel or bladder function, or other concerns.  SEEK IMMEDIATE MEDICAL ATTENTION IF: New numbness, tingling, weakness, or problem with the use of your arms or legs.  Severe back pain not relieved with medications.  Change in bowel or bladder control (if you lose control of stool or urine, or if you are unable to urinate) Increasing pain in any areas of the body (such as chest or abdominal pain).  Shortness of breath, dizziness or fainting.  Nausea (feeling sick to your stomach), vomiting, fever, or sweats.

## 2015-01-06 NOTE — ED Notes (Signed)
Presents with weakness x 1 month, worse today. Appetite unchanged, activity level reduced. Denies any fevers. C/o having body aches, numbness at left leg, gait very steady, able to walk w/o difficulty

## 2015-01-06 NOTE — ED Provider Notes (Signed)
CSN: [id number]     Arrival date & time 01/06/15  1121 History   First MD Initiated Contact with Patient 01/06/15 1235     Chief Complaint  Patient presents with  . Numbness     The history is provided by the patient.  Patient reports left sided neck pain for past month.  It is worsening.  It is worsened with palpation.  No falls/trauma.  She reports pain radiates into left arm.  No focal weakness.  No cp/sob.  No HA  She also reports left leg numbness and cramping at times, none currently No acute back pain reported No urinary/fecal incontinence is reported.   Past Medical History  Diagnosis Date  . Chest pain   . Hyperlipidemia   . Sleep apnea   . GERD (gastroesophageal reflux disease)   . Obesity   . At risk for colon cancer     Last colonoscopy in [place] 03/2009 found hyperplastic polyp only  . Osteopenia 08/31/2012  . Left shoulder pain 11/14/2012  . Unspecified sinusitis (chronic) 03/30/2013  . Allergic state 03/25/2012  . Preventative health care 03/30/2013  . Acute sinusitis 03/30/2013  . Hiatal hernia with gastroesophageal reflux 04/27/2010    Qualifier: Diagnosis of  By: Danelle Earthly CMA, Darlene  Failed Omeprazole and Protonix   . Atherosclerosis of aorta 04/12/2014  . Aortic calcification 08/27/2014  . Peripheral edema 12/13/2014   Past Surgical History  Procedure Laterality Date  . Dilation and curettage of uterus     Family History  Problem Relation Age of Onset  . Stroke Mother   . Lung cancer Father   . Stroke Sister    Social History  Substance Use Topics  . Smoking status: Never Smoker   . Smokeless tobacco: Never Used  . Alcohol Use: No   OB History    No data available     Review of Systems  Constitutional: Negative for fever.  Cardiovascular: Negative for chest pain.  Musculoskeletal: Positive for neck pain.  Neurological: Positive for numbness.      Allergies  Review of patient's allergies indicates no known allergies.  Home Medications    Prior to Admission medications   Medication Sig Start Date End Date Taking? Authorizing Provider  aspirin EC 81 MG tablet Take 81 mg by mouth every other day. 07/21/13   Mosie Lukes, MD  cetirizine (ZYRTEC) 10 MG tablet Take 1 tablet (10 mg total) by mouth daily as needed for allergies. 12/04/14   Mosie Lukes, MD  cyclobenzaprine (FLEXERIL) 10 MG tablet Take 1 tablet (10 mg total) by mouth at bedtime as needed for muscle spasms. 12/04/14   Mosie Lukes, MD  famotidine (PEPCID) 40 MG tablet TAKE 1 TABLET (40 MG TOTAL) BY MOUTH DAILY. 12/23/14   Mosie Lukes, MD  famotidine (PEPCID) 40 MG tablet TAKE 1 TABLET (40 MG TOTAL) BY MOUTH DAILY. 01/05/15   Mosie Lukes, MD  fish oil-omega-3 fatty acids 1000 MG capsule Take 2 g by mouth daily.    Historical Provider, MD  furosemide (LASIX) 20 MG tablet Take 1 tablet (20 mg total) by mouth daily. Take for edema or weight gain>3# 12/04/14   Mosie Lukes, MD  MULTIPLE VITAMINS PO Take by mouth daily.    Historical Provider, MD  pantoprazole (PROTONIX) 40 MG tablet Take 1 tablet (40 mg total) by mouth 2 (two) times daily before a meal. Take 1 tab in the morning. 11/03/14   Milus Banister, MD  rosuvastatin (  CRESTOR) 10 MG tablet TAKE 1 TABLET (10 MG TOTAL) BY MOUTH DAILY. 12/04/14   Mosie Lukes, MD   BP 129/54 mmHg  Pulse 66  Temp(Src) 98.3 F (36.8 C) (Oral)  Resp 16  Ht 5\' 2"  (1.575 m)  Wt 17 lb 6.9 oz (7.906 kg)  BMI 3.19 kg/m2  SpO2 99% Physical Exam CONSTITUTIONAL: Well developed/well nourished, well appearing, using phone when I enter HEAD: Normocephalic/atraumatic EYES: EOMI/PERRL ENMT: Mucous membranes moist NECK: supple no meningeal signs SPINE/BACK:entire spine nontender, cervical paraspinal tenderness CV: S1/S2 noted, no murmurs/rubs/gallops noted LUNGS: Lungs are clear to auscultation bilaterally, no apparent distress ABDOMEN: soft, nontender, no rebound or guarding GU:no cva tenderness NEURO: Awake/alert,  equal motor 5/5  strength noted with the following: hip flexion/knee flexion/extension, foot dorsi/plantar flexion, no sensory deficit in any dermatome.   Pt is able to ambulate unassisted.  Equal power (5/5) with hand grip, wrist flex/extension, elbow flex/extension, and equal power with shoulder abduction/adduction.  No focal sensory deficit to light touch is noted in either UE.   Equal (2+) biceps/brachioradialis reflex in bilateral UE EXTREMITIES: pulses normal, full ROM, no calf tenderness/edema noted SKIN: warm, color normal PSYCH: no abnormalities of mood noted, alert and oriented to situation   ED Course  Procedures  Imaging Review Dg Cervical Spine Complete  01/06/2015   CLINICAL DATA:  Neck pain for a few months.  No radiculopathy.  EXAM: CERVICAL SPINE  4+ VIEWS  COMPARISON:  None.  FINDINGS: The cervical spine is visualized to the level of C6-7.  The vertebral body heights are maintained. The alignment is normal. There is loss of the normal cervical lordosis with mild reversal. The prevertebral soft tissues are normal. There is no acute fracture or static listhesis. There is mild degenerative disc disease at C5-6 with disc height loss.  IMPRESSION: Negative cervical spine radiographs.   Electronically Signed   By: Kathreen Devoid   On: 01/06/2015 14:06   2:19 PM Pt ambulatory, well appearing, no distress No focal arm/leg weakness noted Advised need for PCP follouwp  MDM   Final diagnoses:  None    Nursing notes including past medical history and social history reviewed and considered in documentation xrays/imaging reviewed by myself and considered during evaluation     Ripley Fraise, MD 01/06/15 1420

## 2015-01-06 NOTE — ED Notes (Signed)
Patient transported to X-ray 

## 2015-01-06 NOTE — ED Notes (Signed)
C/o numbness to left leg and left arm and pain to posterior neck x 1 month-pt with steady gait-NAD

## 2015-01-19 ENCOUNTER — Encounter: Payer: BLUE CROSS/BLUE SHIELD | Admitting: Family Medicine

## 2015-01-27 ENCOUNTER — Encounter: Payer: Self-pay | Admitting: Cardiology

## 2015-01-27 ENCOUNTER — Ambulatory Visit (INDEPENDENT_AMBULATORY_CARE_PROVIDER_SITE_OTHER): Payer: BLUE CROSS/BLUE SHIELD | Admitting: Cardiology

## 2015-01-27 VITALS — BP 134/73 | HR 79 | Ht 62.0 in | Wt 176.1 lb

## 2015-01-27 DIAGNOSIS — R0789 Other chest pain: Secondary | ICD-10-CM

## 2015-01-27 DIAGNOSIS — I10 Essential (primary) hypertension: Secondary | ICD-10-CM | POA: Diagnosis not present

## 2015-01-27 DIAGNOSIS — E782 Mixed hyperlipidemia: Secondary | ICD-10-CM | POA: Diagnosis not present

## 2015-01-27 DIAGNOSIS — R072 Precordial pain: Secondary | ICD-10-CM | POA: Diagnosis not present

## 2015-01-27 NOTE — Patient Instructions (Signed)
Your physician recommends that you schedule a follow-up appointment in: as needed  

## 2015-01-27 NOTE — Assessment & Plan Note (Signed)
Symptoms are most consistent with gastroesophageal reflux disease. Previous nuclear study negative. No plans for further ischemia evaluation at this point. I have encouraged her to be compliant with her Protonix and Pepcid.

## 2015-01-27 NOTE — Assessment & Plan Note (Signed)
Blood pressure controlled. Continue present medications. 

## 2015-01-27 NOTE — Assessment & Plan Note (Signed)
Continue statin. 

## 2015-01-27 NOTE — Progress Notes (Signed)
HPI: 64 year old female for evaluation of chest pain. Seen by Dr. Marlou Porch in April 2015 with complaints of chest pain. Nuclear study May 2015 showed normal LV function with no ischemia or infarction. Abdominal ultrasound March 2016 showed no aneurysm. Echocardiogram July 2016 showed normal LV function and grade 1 diastolic dysfunction. Mild left ventricular hypertrophy. Patient describes epigastric and substernal pain predominantly after eating. It is relieved with Protonix and Pepcid. His long-standing. Activities improve her chest pain. The pain does not radiate and there are no associated symptoms. She does not have exertional chest pain. Mild dyspnea on exertion but no orthopnea, PND or syncope. Occasional mild pedal edema.  Current Outpatient Prescriptions  Medication Sig Dispense Refill  . cetirizine (ZYRTEC) 10 MG tablet Take 1 tablet (10 mg total) by mouth daily as needed for allergies. 30 tablet 6  . cyclobenzaprine (FLEXERIL) 10 MG tablet Take 1 tablet (10 mg total) by mouth at bedtime as needed for muscle spasms. 30 tablet 0  . famotidine (PEPCID) 40 MG tablet TAKE 1 TABLET (40 MG TOTAL) BY MOUTH DAILY. 30 tablet 3  . fish oil-omega-3 fatty acids 1000 MG capsule Take 2 g by mouth daily.    . furosemide (LASIX) 20 MG tablet Take 1 tablet (20 mg total) by mouth daily. Take for edema or weight gain>3# 30 tablet 3  . MULTIPLE VITAMINS PO Take by mouth daily.    . pantoprazole (PROTONIX) 40 MG tablet Take 1 tablet (40 mg total) by mouth 2 (two) times daily before a meal. Take 1 tab in the morning. 60 tablet 11  . rosuvastatin (CRESTOR) 10 MG tablet TAKE 1 TABLET (10 MG TOTAL) BY MOUTH DAILY. 30 tablet 6   No current facility-administered medications for this visit.    Allergies  Allergen Reactions  . No Known Allergies      Past Medical History  Diagnosis Date  . Chest pain   . Hyperlipidemia   . Sleep apnea   . GERD (gastroesophageal reflux disease)   . Obesity   . At risk  for colon cancer     Last colonoscopy in [place] 03/2009 found hyperplastic polyp only  . Osteopenia 08/31/2012  . Unspecified sinusitis (chronic) 03/30/2013  . Allergic state 03/25/2012  . Hiatal hernia with gastroesophageal reflux 04/27/2010    Qualifier: Diagnosis of  By: Danelle Earthly CMA, Darlene  Failed Omeprazole and Protonix   . Atherosclerosis of aorta 04/12/2014  . Aortic calcification 08/27/2014  . Peripheral edema 12/13/2014    Past Surgical History  Procedure Laterality Date  . Dilation and curettage of uterus      Social History   Social History  . Marital Status: Married    Spouse Name: N/A  . Number of Children: 2  . Years of Education: N/A   Occupational History  . Accountant    Social History Main Topics  . Smoking status: Never Smoker   . Smokeless tobacco: Never Used  . Alcohol Use: 0.0 oz/week    0 Standard drinks or equivalent per week     Comment: Rarely  . Drug Use: No  . Sexual Activity: Not on file   Other Topics Concern  . Not on file   Social History Narrative    Family History  Problem Relation Age of Onset  . Stroke Mother   . Lung cancer Father   . Stroke Sister     ROS: no fevers or chills, productive cough, hemoptysis, dysphasia, odynophagia, melena, hematochezia, dysuria, hematuria, rash, seizure  activity, orthopnea, PND, claudication. Remaining systems are negative.  Physical Exam:   Blood pressure 134/73, pulse 79, height 5\' 2"  (1.575 m), weight 176 lb 1.9 oz (79.888 kg).  General:  Well developed/well nourished in NAD Skin warm/dry Patient not depressed No peripheral clubbing Back-normal HEENT-normal/normal eyelids Neck supple/normal carotid upstroke bilaterally; no bruits; no JVD; no thyromegaly chest - CTA/ normal expansion CV - RRR/normal S1 and S2; no murmurs, rubs or gallops;  PMI nondisplaced Abdomen -NT/ND, no HSM, no mass, + bowel sounds, no bruit 2+ femoral pulses, no bruits Ext-trace edema, no chords, 2+  DP Neuro-grossly nonfocal  ECG sinus rhythm at a rate of 79. No ST changes.

## 2015-01-28 ENCOUNTER — Encounter: Payer: Self-pay | Admitting: Pulmonary Disease

## 2015-01-28 ENCOUNTER — Ambulatory Visit (INDEPENDENT_AMBULATORY_CARE_PROVIDER_SITE_OTHER): Payer: BLUE CROSS/BLUE SHIELD | Admitting: Pulmonary Disease

## 2015-01-28 VITALS — BP 136/77 | HR 74 | Temp 98.2°F | Ht 62.0 in | Wt 178.0 lb

## 2015-01-28 DIAGNOSIS — K219 Gastro-esophageal reflux disease without esophagitis: Secondary | ICD-10-CM | POA: Diagnosis not present

## 2015-01-28 DIAGNOSIS — G4730 Sleep apnea, unspecified: Secondary | ICD-10-CM

## 2015-01-28 DIAGNOSIS — K449 Diaphragmatic hernia without obstruction or gangrene: Secondary | ICD-10-CM | POA: Diagnosis not present

## 2015-01-28 NOTE — Assessment & Plan Note (Signed)
Rx will be sent to [facility] for new CPAP 9 cm & supplies Obtain  Download in 4 wks Call as needed  Weight loss encouraged, compliance with goal of at least 4-6 hrs every night is the expectation. Advised against medications with sedative side effects Cautioned against driving when sleepy - understanding that sleepiness will vary on a day to day basis

## 2015-01-28 NOTE — Patient Instructions (Signed)
Rx will be sent to [facility] for new CPAP 9 cm & supplies Obtain  Download in 4 wks Call as needed

## 2015-01-28 NOTE — Assessment & Plan Note (Signed)
corelation with OSA discussed

## 2015-01-28 NOTE — Addendum Note (Signed)
Addended by: Mathis Dad on: 01/28/2015 04:41 PM   Modules accepted: Orders

## 2015-01-28 NOTE — Progress Notes (Signed)
   Subjective:    Patient ID: Margaret [name], female    DOB: Jul 23, 1950, 64 y.o.   MRN: [id number]  HPI  59/F, Filipino woman, never smoker, for FU of obstructive sleep apnea.  She moved from [place] In 2010  PSG 05/2007 >> AHI 49/h, lowest desatn 71%, , corrected by CPAP 9 cm, placed on auto CPAP with nasal pillows   01/28/2015  Chief Complaint  Patient presents with  . Sleep Apnea    Patient says she has had her machine for 5 years and needs a new machine.  Patient never received new machine.  wants a smaller machine that she can travel with.  machine doesn't work properly,     New CPAP Rx written last year but never got it - she did not call back  She has accepted the fact that she needs CPAP Does not rest well without it - feels tired FF mask works best - pillows get dislodged Pr ok, mask ok , would like to get new cpap machine Wt same - she is compliant now    Review of Systems neg for any significant sore throat, dysphagia, itching, sneezing, nasal congestion or excess/ purulent secretions, fever, chills, sweats, unintended wt loss, pleuritic or exertional cp, hempoptysis, orthopnea pnd or change in chronic leg swelling. Also denies presyncope, palpitations, heartburn, abdominal pain, nausea, vomiting, diarrhea or change in bowel or urinary habits, dysuria,hematuria, rash, arthralgias, visual complaints, headache, numbness weakness or ataxia.     Objective:   Physical Exam  Gen. Pleasant, obese, in no distress ENT - no lesions, no post nasal drip Neck: No JVD, no thyromegaly, no carotid bruits Lungs: no use of accessory muscles, no dullness to percussion, decreased without rales or rhonchi  Cardiovascular: Rhythm regular, heart sounds  normal, no murmurs or gallops, no peripheral edema Musculoskeletal: No deformities, no cyanosis or clubbing , no tremors       Assessment & Plan:

## 2015-02-03 ENCOUNTER — Telehealth: Payer: Self-pay | Admitting: Pulmonary Disease

## 2015-02-03 NOTE — Telephone Encounter (Signed)
Alverda Skeans, spoke with Glenn has been taken care of and nothing further is needed at this time.  Will sign off.

## 2015-02-10 ENCOUNTER — Encounter: Payer: Self-pay | Admitting: *Deleted

## 2015-02-10 ENCOUNTER — Telehealth: Payer: Self-pay | Admitting: *Deleted

## 2015-02-10 NOTE — Telephone Encounter (Signed)
Pre-Visit Call completed with patient and chart updated.   Pre-Visit Info documented in Specialty Comments under [employer].    

## 2015-02-11 ENCOUNTER — Encounter: Payer: Self-pay | Admitting: Family Medicine

## 2015-02-11 ENCOUNTER — Ambulatory Visit (INDEPENDENT_AMBULATORY_CARE_PROVIDER_SITE_OTHER): Payer: BLUE CROSS/BLUE SHIELD | Admitting: Family Medicine

## 2015-02-11 VITALS — BP 126/62 | HR 64 | Temp 97.5°F | Ht 62.0 in | Wt 176.8 lb

## 2015-02-11 DIAGNOSIS — R252 Cramp and spasm: Secondary | ICD-10-CM

## 2015-02-11 DIAGNOSIS — I10 Essential (primary) hypertension: Secondary | ICD-10-CM | POA: Diagnosis not present

## 2015-02-11 DIAGNOSIS — Z1239 Encounter for other screening for malignant neoplasm of breast: Secondary | ICD-10-CM | POA: Diagnosis not present

## 2015-02-11 DIAGNOSIS — Z0000 Encounter for general adult medical examination without abnormal findings: Secondary | ICD-10-CM

## 2015-02-11 DIAGNOSIS — R739 Hyperglycemia, unspecified: Secondary | ICD-10-CM | POA: Diagnosis not present

## 2015-02-11 DIAGNOSIS — Z23 Encounter for immunization: Secondary | ICD-10-CM

## 2015-02-11 DIAGNOSIS — Z8619 Personal history of other infectious and parasitic diseases: Secondary | ICD-10-CM

## 2015-02-11 DIAGNOSIS — E782 Mixed hyperlipidemia: Secondary | ICD-10-CM | POA: Diagnosis not present

## 2015-02-11 LAB — LIPID PANEL
Cholesterol: 188 mg/dL (ref 0–200)
HDL: 57.8 mg/dL (ref >39.00)
LDL CALC: 112 mg/dL — AB (ref 0–99)
NonHDL: 130.32
Total CHOL/HDL Ratio: 3
Triglycerides: 91 mg/dL (ref 0.0–149.0)
VLDL: 18.2 mg/dL (ref 0.0–40.0)

## 2015-02-11 LAB — CBC
HEMATOCRIT: 40.5 % (ref 36.0–46.0)
Hemoglobin: 13.2 g/dL (ref 12.0–15.0)
MCHC: 32.6 g/dL (ref 30.0–36.0)
MCV: 86.7 fl (ref 78.0–100.0)
PLATELETS: 236 10*3/uL (ref 150.0–400.0)
RBC: 4.68 Mil/uL (ref 3.87–5.11)
RDW: 14.3 % (ref 11.5–15.5)
WBC: 5.1 10*3/uL (ref 4.0–10.5)

## 2015-02-11 LAB — HEMOGLOBIN A1C: HEMOGLOBIN A1C: 6.1 % (ref 4.6–6.5)

## 2015-02-11 LAB — COMPREHENSIVE METABOLIC PANEL
ALT: 24 U/L (ref 0–35)
AST: 20 U/L (ref 0–37)
Albumin: 4.4 g/dL (ref 3.5–5.2)
Alkaline Phosphatase: 78 U/L (ref 39–117)
BUN: 15 mg/dL (ref 6–23)
CALCIUM: 9.6 mg/dL (ref 8.4–10.5)
CHLORIDE: 104 meq/L (ref 96–112)
CO2: 32 meq/L (ref 19–32)
Creatinine, Ser: 0.74 mg/dL (ref 0.40–1.20)
GFR: 84.59 mL/min (ref >60.00)
Glucose, Bld: 97 mg/dL (ref 70–99)
Potassium: 4.2 mEq/L (ref 3.5–5.1)
Sodium: 141 mEq/L (ref 135–145)
Total Bilirubin: 0.4 mg/dL (ref 0.2–1.2)
Total Protein: 7.5 g/dL (ref 6.0–8.3)

## 2015-02-11 LAB — MAGNESIUM: Magnesium: 2.4 mg/dL (ref 1.5–2.5)

## 2015-02-11 LAB — TSH: TSH: 1.33 u[IU]/mL (ref 0.35–4.50)

## 2015-02-11 MED ORDER — INFLUENZA VAC SPLIT QUAD 0.5 ML IM SUSY
0.5000 mL | PREFILLED_SYRINGE | INTRAMUSCULAR | Status: AC
  Administered 2015-02-11: 0.5 mL via INTRAMUSCULAR

## 2015-02-11 NOTE — Assessment & Plan Note (Signed)
Worsening symptoms with a history of H Pylori will proceed with breath test in 2 weeks once she has been off the PPI continue Pepcid and can use Rolaids etc as needed

## 2015-02-11 NOTE — Assessment & Plan Note (Signed)
minimize simple carbs. Increase exercise as tolerated.  

## 2015-02-11 NOTE — Progress Notes (Signed)
Pre visit review using our clinic review tool, if applicable. No additional management support is needed unless otherwise documented below in the visit note. 

## 2015-02-11 NOTE — Assessment & Plan Note (Signed)
Encouraged heart healthy diet, increase exercise, avoid trans fats, consider a krill oil cap daily 

## 2015-02-11 NOTE — Assessment & Plan Note (Signed)
Well controlled, no changes to meds. Encouraged heart healthy diet such as the DASH diet and exercise as tolerated.  °

## 2015-02-11 NOTE — Patient Instructions (Addendum)
Psyllium natural fiber supplement good for bowels Tums, Mylanta or Rolaids in case of increased indigestion during next 2 weeks  Preventive Care for Adults A healthy lifestyle and preventive care can promote health and wellness. Preventive health guidelines for women include the following key practices.  A routine yearly physical is a good way to check with your health care provider about your health and preventive screening. It is a chance to share any concerns and updates on your health and to receive a thorough exam.  Visit your dentist for a routine exam and preventive care every 6 months. Brush your teeth twice a day and floss once a day. Good oral hygiene prevents tooth decay and gum disease.  The frequency of eye exams is based on your age, health, family medical history, use of contact lenses, and other factors. Follow your health care provider's recommendations for frequency of eye exams.  Eat a healthy diet. Foods like vegetables, fruits, whole grains, low-fat dairy products, and lean protein foods contain the nutrients you need without too many calories. Decrease your intake of foods high in solid fats, added sugars, and salt. Eat the right amount of calories for you.Get information about a proper diet from your health care provider, if necessary.  Regular physical exercise is one of the most important things you can do for your health. Most adults should get at least 150 minutes of moderate-intensity exercise (any activity that increases your heart rate and causes you to sweat) each week. In addition, most adults need muscle-strengthening exercises on 2 or more days a week.  Maintain a healthy weight. The body mass index (BMI) is a screening tool to identify possible weight problems. It provides an estimate of body fat based on height and weight. Your health care provider can find your BMI and can help you achieve or maintain a healthy weight.For adults 20 years and older:  A BMI  below 18.5 is considered underweight.  A BMI of 18.5 to 24.9 is normal.  A BMI of 25 to 29.9 is considered overweight.  A BMI of 30 and above is considered obese.  Maintain normal blood lipids and cholesterol levels by exercising and minimizing your intake of saturated fat. Eat a balanced diet with plenty of fruit and vegetables. Blood tests for lipids and cholesterol should begin at age 57 and be repeated every 5 years. If your lipid or cholesterol levels are high, you are over 50, or you are at high risk for heart disease, you may need your cholesterol levels checked more frequently.Ongoing high lipid and cholesterol levels should be treated with medicines if diet and exercise are not working.  If you smoke, find out from your health care provider how to quit. If you do not use tobacco, do not start.  Lung cancer screening is recommended for adults aged 73-80 years who are at high risk for developing lung cancer because of a history of smoking. A yearly low-dose CT scan of the lungs is recommended for people who have at least a 30-pack-year history of smoking and are a current smoker or have quit within the past 15 years. A pack year of smoking is smoking an average of 1 pack of cigarettes a day for 1 year (for example: 1 pack a day for 30 years or 2 packs a day for 15 years). Yearly screening should continue until the smoker has stopped smoking for at least 15 years. Yearly screening should be stopped for people who develop a health problem that  would prevent them from having lung cancer treatment.  If you are pregnant, do not drink alcohol. If you are breastfeeding, be very cautious about drinking alcohol. If you are not pregnant and choose to drink alcohol, do not have more than 1 drink per day. One drink is considered to be 12 ounces (355 mL) of beer, 5 ounces (148 mL) of wine, or 1.5 ounces (44 mL) of liquor.  Avoid use of street drugs. Do not share needles with anyone. Ask for help if you  need support or instructions about stopping the use of drugs.  High blood pressure causes heart disease and increases the risk of stroke. Your blood pressure should be checked at least every 1 to 2 years. Ongoing high blood pressure should be treated with medicines if weight loss and exercise do not work.  If you are 55-79 years old, ask your health care provider if you should take aspirin to prevent strokes.  Diabetes screening involves taking a blood sample to check your fasting blood sugar level. This should be done once every 3 years, after age 45, if you are within normal weight and without risk factors for diabetes. Testing should be considered at a younger age or be carried out more frequently if you are overweight and have at least 1 risk factor for diabetes.  Breast cancer screening is essential preventive care for women. You should practice "breast self-awareness." This means understanding the normal appearance and feel of your breasts and may include breast self-examination. Any changes detected, no matter how small, should be reported to a health care provider. Women in their 20s and 30s should have a clinical breast exam (CBE) by a health care provider as part of a regular health exam every 1 to 3 years. After age 40, women should have a CBE every year. Starting at age 40, women should consider having a mammogram (breast X-ray test) every year. Women who have a family history of breast cancer should talk to their health care provider about genetic screening. Women at a high risk of breast cancer should talk to their health care providers about having an MRI and a mammogram every year.  Breast cancer gene (BRCA)-related cancer risk assessment is recommended for women who have family members with BRCA-related cancers. BRCA-related cancers include breast, ovarian, tubal, and peritoneal cancers. Having family members with these cancers may be associated with an increased risk for harmful changes  (mutations) in the breast cancer genes BRCA1 and BRCA2. Results of the assessment will determine the need for genetic counseling and BRCA1 and BRCA2 testing.  Routine pelvic exams to screen for cancer are no longer recommended for nonpregnant women who are considered low risk for cancer of the pelvic organs (ovaries, uterus, and vagina) and who do not have symptoms. Ask your health care provider if a screening pelvic exam is right for you.  If you have had past treatment for cervical cancer or a condition that could lead to cancer, you need Pap tests and screening for cancer for at least 20 years after your treatment. If Pap tests have been discontinued, your risk factors (such as having a new sexual partner) need to be reassessed to determine if screening should be resumed. Some women have medical problems that increase the chance of getting cervical cancer. In these cases, your health care provider may recommend more frequent screening and Pap tests.  The HPV test is an additional test that may be used for cervical cancer screening. The HPV test looks   for the virus that can cause the cell changes on the cervix. The cells collected during the Pap test can be tested for HPV. The HPV test could be used to screen women aged 30 years and older, and should be used in women of any age who have unclear Pap test results. After the age of 30, women should have HPV testing at the same frequency as a Pap test.  Colorectal cancer can be detected and often prevented. Most routine colorectal cancer screening begins at the age of 50 years and continues through age 75 years. However, your health care provider may recommend screening at an earlier age if you have risk factors for colon cancer. On a yearly basis, your health care provider may provide home test kits to check for hidden blood in the stool. Use of a small camera at the end of a tube, to directly examine the colon (sigmoidoscopy or colonoscopy), can detect the  earliest forms of colorectal cancer. Talk to your health care provider about this at age 50, when routine screening begins. Direct exam of the colon should be repeated every 5-10 years through age 75 years, unless early forms of pre-cancerous polyps or small growths are found.  People who are at an increased risk for hepatitis B should be screened for this virus. You are considered at high risk for hepatitis B if:  You were born in a country where hepatitis B occurs often. Talk with your health care provider about which countries are considered high risk.  Your parents were born in a high-risk country and you have not received a shot to protect against hepatitis B (hepatitis B vaccine).  You have HIV or AIDS.  You use needles to inject street drugs.  You live with, or have sex with, someone who has hepatitis B.  You get hemodialysis treatment.  You take certain medicines for conditions like cancer, organ transplantation, and autoimmune conditions.  Hepatitis C blood testing is recommended for all people born from 1945 through 1965 and any individual with known risks for hepatitis C.  Practice safe sex. Use condoms and avoid high-risk sexual practices to reduce the spread of sexually transmitted infections (STIs). STIs include gonorrhea, chlamydia, syphilis, trichomonas, herpes, HPV, and human immunodeficiency virus (HIV). Herpes, HIV, and HPV are viral illnesses that have no cure. They can result in disability, cancer, and death.  You should be screened for sexually transmitted illnesses (STIs) including gonorrhea and chlamydia if:  You are sexually active and are younger than 24 years.  You are older than 24 years and your health care provider tells you that you are at risk for this type of infection.  Your sexual activity has changed since you were last screened and you are at an increased risk for chlamydia or gonorrhea. Ask your health care provider if you are at risk.  If you are  at risk of being infected with HIV, it is recommended that you take a prescription medicine daily to prevent HIV infection. This is called preexposure prophylaxis (PrEP). You are considered at risk if:  You are a heterosexual woman, are sexually active, and are at increased risk for HIV infection.  You take drugs by injection.  You are sexually active with a partner who has HIV.  Talk with your health care provider about whether you are at high risk of being infected with HIV. If you choose to begin PrEP, you should first be tested for HIV. You should then be tested every 3 months   for as long as you are taking PrEP.  Osteoporosis is a disease in which the bones lose minerals and strength with aging. This can result in serious bone fractures or breaks. The risk of osteoporosis can be identified using a bone density scan. Women ages 65 years and over and women at risk for fractures or osteoporosis should discuss screening with their health care providers. Ask your health care provider whether you should take a calcium supplement or vitamin D to reduce the rate of osteoporosis.  Menopause can be associated with physical symptoms and risks. Hormone replacement therapy is available to decrease symptoms and risks. You should talk to your health care provider about whether hormone replacement therapy is right for you.  Use sunscreen. Apply sunscreen liberally and repeatedly throughout the day. You should seek shade when your shadow is shorter than you. Protect yourself by wearing long sleeves, pants, a wide-brimmed hat, and sunglasses year round, whenever you are outdoors.  Once a month, do a whole body skin exam, using a mirror to look at the skin on your back. Tell your health care provider of new moles, moles that have irregular borders, moles that are larger than a pencil eraser, or moles that have changed in shape or color.  Stay current with required vaccines (immunizations).  Influenza vaccine.  All adults should be immunized every year.  Tetanus, diphtheria, and acellular pertussis (Td, Tdap) vaccine. Pregnant women should receive 1 dose of Tdap vaccine during each pregnancy. The dose should be obtained regardless of the length of time since the last dose. Immunization is preferred during the 27th-36th week of gestation. An adult who has not previously received Tdap or who does not know her vaccine status should receive 1 dose of Tdap. This initial dose should be followed by tetanus and diphtheria toxoids (Td) booster doses every 10 years. Adults with an unknown or incomplete history of completing a 3-dose immunization series with Td-containing vaccines should begin or complete a primary immunization series including a Tdap dose. Adults should receive a Td booster every 10 years.  Varicella vaccine. An adult without evidence of immunity to varicella should receive 2 doses or a second dose if she has previously received 1 dose. Pregnant females who do not have evidence of immunity should receive the first dose after pregnancy. This first dose should be obtained before leaving the health care facility. The second dose should be obtained 4-8 weeks after the first dose.  Human papillomavirus (HPV) vaccine. Females aged 13-26 years who have not received the vaccine previously should obtain the 3-dose series. The vaccine is not recommended for use in pregnant females. However, pregnancy testing is not needed before receiving a dose. If a female is found to be pregnant after receiving a dose, no treatment is needed. In that case, the remaining doses should be delayed until after the pregnancy. Immunization is recommended for any person with an immunocompromised condition through the age of 26 years if she did not get any or all doses earlier. During the 3-dose series, the second dose should be obtained 4-8 weeks after the first dose. The third dose should be obtained 24 weeks after the first dose and 16  weeks after the second dose.  Zoster vaccine. One dose is recommended for adults aged 60 years or older unless certain conditions are present.  Measles, mumps, and rubella (MMR) vaccine. Adults born before 1957 generally are considered immune to measles and mumps. Adults born in 1957 or later should have 1 or   more doses of MMR vaccine unless there is a contraindication to the vaccine or there is laboratory evidence of immunity to each of the three diseases. A routine second dose of MMR vaccine should be obtained at least 28 days after the first dose for students attending postsecondary schools, health care workers, or international travelers. People who received inactivated measles vaccine or an unknown type of measles vaccine during 1963-1967 should receive 2 doses of MMR vaccine. People who received inactivated mumps vaccine or an unknown type of mumps vaccine before 1979 and are at high risk for mumps infection should consider immunization with 2 doses of MMR vaccine. For females of childbearing age, rubella immunity should be determined. If there is no evidence of immunity, females who are not pregnant should be vaccinated. If there is no evidence of immunity, females who are pregnant should delay immunization until after pregnancy. Unvaccinated health care workers born before 1957 who lack laboratory evidence of measles, mumps, or rubella immunity or laboratory confirmation of disease should consider measles and mumps immunization with 2 doses of MMR vaccine or rubella immunization with 1 dose of MMR vaccine.  Pneumococcal 13-valent conjugate (PCV13) vaccine. When indicated, a person who is uncertain of her immunization history and has no record of immunization should receive the PCV13 vaccine. An adult aged 19 years or older who has certain medical conditions and has not been previously immunized should receive 1 dose of PCV13 vaccine. This PCV13 should be followed with a dose of pneumococcal  polysaccharide (PPSV23) vaccine. The PPSV23 vaccine dose should be obtained at least 8 weeks after the dose of PCV13 vaccine. An adult aged 19 years or older who has certain medical conditions and previously received 1 or more doses of PPSV23 vaccine should receive 1 dose of PCV13. The PCV13 vaccine dose should be obtained 1 or more years after the last PPSV23 vaccine dose.  Pneumococcal polysaccharide (PPSV23) vaccine. When PCV13 is also indicated, PCV13 should be obtained first. All adults aged 65 years and older should be immunized. An adult younger than age 65 years who has certain medical conditions should be immunized. Any person who resides in a nursing home or long-term care facility should be immunized. An adult smoker should be immunized. People with an immunocompromised condition and certain other conditions should receive both PCV13 and PPSV23 vaccines. People with human immunodeficiency virus (HIV) infection should be immunized as soon as possible after diagnosis. Immunization during chemotherapy or radiation therapy should be avoided. Routine use of PPSV23 vaccine is not recommended for American Indians, [place] Natives, or people younger than 65 years unless there are medical conditions that require PPSV23 vaccine. When indicated, people who have unknown immunization and have no record of immunization should receive PPSV23 vaccine. One-time revaccination 5 years after the first dose of PPSV23 is recommended for people aged 19-64 years who have chronic kidney failure, nephrotic syndrome, asplenia, or immunocompromised conditions. People who received 1-2 doses of PPSV23 before age 65 years should receive another dose of PPSV23 vaccine at age 65 years or later if at least 5 years have passed since the previous dose. Doses of PPSV23 are not needed for people immunized with PPSV23 at or after age 65 years.  Meningococcal vaccine. Adults with asplenia or persistent complement component deficiencies  should receive 2 doses of quadrivalent meningococcal conjugate (MenACWY-D) vaccine. The doses should be obtained at least 2 months apart. Microbiologists working with certain meningococcal bacteria, military recruits, people at risk during an outbreak, and people who travel   to or live in countries with a high rate of meningitis should be immunized. A first-year college student up through age 21 years who is living in a residence hall should receive a dose if she did not receive a dose on or after her 16th birthday. Adults who have certain high-risk conditions should receive one or more doses of vaccine.  Hepatitis A vaccine. Adults who wish to be protected from this disease, have certain high-risk conditions, work with hepatitis A-infected animals, work in hepatitis A research labs, or travel to or work in countries with a high rate of hepatitis A should be immunized. Adults who were previously unvaccinated and who anticipate close contact with an international adoptee during the first 60 days after arrival in the [place] from a country with a high rate of hepatitis A should be immunized.  Hepatitis B vaccine. Adults who wish to be protected from this disease, have certain high-risk conditions, may be exposed to blood or other infectious body fluids, are household contacts or sex partners of hepatitis B positive people, are clients or workers in certain care facilities, or travel to or work in countries with a high rate of hepatitis B should be immunized.  Haemophilus influenzae type b (Hib) vaccine. A previously unvaccinated person with asplenia or sickle cell disease or having a scheduled splenectomy should receive 1 dose of Hib vaccine. Regardless of previous immunization, a recipient of a hematopoietic stem cell transplant should receive a 3-dose series 6-12 months after her successful transplant. Hib vaccine is not recommended for adults with HIV infection. Preventive Services / Frequency Ages 19  to 39 years  Blood pressure check.** / Every 1 to 2 years.  Lipid and cholesterol check.** / Every 5 years beginning at age 20.  Clinical breast exam.** / Every 3 years for women in their 20s and 30s.  BRCA-related cancer risk assessment.** / For women who have family members with a BRCA-related cancer (breast, ovarian, tubal, or peritoneal cancers).  Pap test.** / Every 2 years from ages 21 through 29. Every 3 years starting at age 30 through age 65 or 70 with a history of 3 consecutive normal Pap tests.  HPV screening.** / Every 3 years from ages 30 through ages 65 to 70 with a history of 3 consecutive normal Pap tests.  Hepatitis C blood test.** / For any individual with known risks for hepatitis C.  Skin self-exam. / Monthly.  Influenza vaccine. / Every year.  Tetanus, diphtheria, and acellular pertussis (Tdap, Td) vaccine.** / Consult your health care provider. Pregnant women should receive 1 dose of Tdap vaccine during each pregnancy. 1 dose of Td every 10 years.  Varicella vaccine.** / Consult your health care provider. Pregnant females who do not have evidence of immunity should receive the first dose after pregnancy.  HPV vaccine. / 3 doses over 6 months, if 26 and younger. The vaccine is not recommended for use in pregnant females. However, pregnancy testing is not needed before receiving a dose.  Measles, mumps, rubella (MMR) vaccine.** / You need at least 1 dose of MMR if you were born in 1957 or later. You may also need a 2nd dose. For females of childbearing age, rubella immunity should be determined. If there is no evidence of immunity, females who are not pregnant should be vaccinated. If there is no evidence of immunity, females who are pregnant should delay immunization until after pregnancy.  Pneumococcal 13-valent conjugate (PCV13) vaccine.** / Consult your health care provider.  Pneumococcal   polysaccharide (PPSV23) vaccine.** / 1 to 2 doses if you smoke cigarettes  or if you have certain conditions.  Meningococcal vaccine.** / 1 dose if you are age 19 to 21 years and a first-year college student living in a residence hall, or have one of several medical conditions, you need to get vaccinated against meningococcal disease. You may also need additional booster doses.  Hepatitis A vaccine.** / Consult your health care provider.  Hepatitis B vaccine.** / Consult your health care provider.  Haemophilus influenzae type b (Hib) vaccine.** / Consult your health care provider. Ages 40 to 64 years  Blood pressure check.** / Every 1 to 2 years.  Lipid and cholesterol check.** / Every 5 years beginning at age 20 years.  Lung cancer screening. / Every year if you are aged 55-80 years and have a 30-pack-year history of smoking and currently smoke or have quit within the past 15 years. Yearly screening is stopped once you have quit smoking for at least 15 years or develop a health problem that would prevent you from having lung cancer treatment.  Clinical breast exam.** / Every year after age 40 years.  BRCA-related cancer risk assessment.** / For women who have family members with a BRCA-related cancer (breast, ovarian, tubal, or peritoneal cancers).  Mammogram.** / Every year beginning at age 40 years and continuing for as long as you are in good health. Consult with your health care provider.  Pap test.** / Every 3 years starting at age 30 years through age 65 or 70 years with a history of 3 consecutive normal Pap tests.  HPV screening.** / Every 3 years from ages 30 years through ages 65 to 70 years with a history of 3 consecutive normal Pap tests.  Fecal occult blood test (FOBT) of stool. / Every year beginning at age 50 years and continuing until age 75 years. You may not need to do this test if you get a colonoscopy every 10 years.  Flexible sigmoidoscopy or colonoscopy.** / Every 5 years for a flexible sigmoidoscopy or every 10 years for a colonoscopy  beginning at age 50 years and continuing until age 75 years.  Hepatitis C blood test.** / For all people born from 1945 through 1965 and any individual with known risks for hepatitis C.  Skin self-exam. / Monthly.  Influenza vaccine. / Every year.  Tetanus, diphtheria, and acellular pertussis (Tdap/Td) vaccine.** / Consult your health care provider. Pregnant women should receive 1 dose of Tdap vaccine during each pregnancy. 1 dose of Td every 10 years.  Varicella vaccine.** / Consult your health care provider. Pregnant females who do not have evidence of immunity should receive the first dose after pregnancy.  Zoster vaccine.** / 1 dose for adults aged 60 years or older.  Measles, mumps, rubella (MMR) vaccine.** / You need at least 1 dose of MMR if you were born in 1955 or later. You may also need a 2nd dose. For females of childbearing age, rubella immunity should be determined. If there is no evidence of immunity, females who are not pregnant should be vaccinated. If there is no evidence of immunity, females who are pregnant should delay immunization until after pregnancy.  Pneumococcal 13-valent conjugate (PCV13) vaccine.** / Consult your health care provider.  Pneumococcal polysaccharide (PPSV23) vaccine.** / 1 to 2 doses if you smoke cigarettes or if you have certain conditions.  Meningococcal vaccine.** / Consult your health care provider.  Hepatitis A vaccine.** / Consult your health care provider.  Hepatitis B   vaccine.** / Consult your health care provider.  Haemophilus influenzae type b (Hib) vaccine.** / Consult your health care provider. Ages 65 years and over  Blood pressure check.** / Every 1 to 2 years.  Lipid and cholesterol check.** / Every 5 years beginning at age 20 years.  Lung cancer screening. / Every year if you are aged 55-80 years and have a 30-pack-year history of smoking and currently smoke or have quit within the past 15 years. Yearly screening is stopped  once you have quit smoking for at least 15 years or develop a health problem that would prevent you from having lung cancer treatment.  Clinical breast exam.** / Every year after age 40 years.  BRCA-related cancer risk assessment.** / For women who have family members with a BRCA-related cancer (breast, ovarian, tubal, or peritoneal cancers).  Mammogram.** / Every year beginning at age 40 years and continuing for as long as you are in good health. Consult with your health care provider.  Pap test.** / Every 3 years starting at age 30 years through age 65 or 70 years with 3 consecutive normal Pap tests. Testing can be stopped between 65 and 70 years with 3 consecutive normal Pap tests and no abnormal Pap or HPV tests in the past 10 years.  HPV screening.** / Every 3 years from ages 30 years through ages 65 or 70 years with a history of 3 consecutive normal Pap tests. Testing can be stopped between 65 and 70 years with 3 consecutive normal Pap tests and no abnormal Pap or HPV tests in the past 10 years.  Fecal occult blood test (FOBT) of stool. / Every year beginning at age 50 years and continuing until age 75 years. You may not need to do this test if you get a colonoscopy every 10 years.  Flexible sigmoidoscopy or colonoscopy.** / Every 5 years for a flexible sigmoidoscopy or every 10 years for a colonoscopy beginning at age 50 years and continuing until age 75 years.  Hepatitis C blood test.** / For all people born from 1945 through 1965 and any individual with known risks for hepatitis C.  Osteoporosis screening.** / A one-time screening for women ages 65 years and over and women at risk for fractures or osteoporosis.  Skin self-exam. / Monthly.  Influenza vaccine. / Every year.  Tetanus, diphtheria, and acellular pertussis (Tdap/Td) vaccine.** / 1 dose of Td every 10 years.  Varicella vaccine.** / Consult your health care provider.  Zoster vaccine.** / 1 dose for adults aged 60 years  or older.  Pneumococcal 13-valent conjugate (PCV13) vaccine.** / Consult your health care provider.  Pneumococcal polysaccharide (PPSV23) vaccine.** / 1 dose for all adults aged 65 years and older.  Meningococcal vaccine.** / Consult your health care provider.  Hepatitis A vaccine.** / Consult your health care provider.  Hepatitis B vaccine.** / Consult your health care provider.  Haemophilus influenzae type b (Hib) vaccine.** / Consult your health care provider. ** Family history and personal history of risk and conditions may change your health care provider's recommendations. Document Released: 07/11/2001 Document Revised: 09/29/2013 Document Reviewed: 10/10/2010 ExitCare Patient Information 2015 [employer]. This information is not intended to replace advice given to you by your health care provider. Make sure you discuss any questions you have with your health care provider.  

## 2015-02-12 LAB — HIV ANTIBODY (ROUTINE TESTING W REFLEX): HIV: NONREACTIVE

## 2015-02-12 LAB — HEPATITIS C ANTIBODY: HCV Ab: NEGATIVE

## 2015-02-21 NOTE — Progress Notes (Signed)
Subjective:    Patient ID: Margaret [name], female    DOB: Sep 10, 1950, 64 y.o.   MRN: [id number]  Chief Complaint  Patient presents with  . Annual Exam    PAP due 2017    HPI Patient is in today for annual exam. She feels fairly well all things considered. Is noting some recent trouble with cramps in her legs most notably in the left lung. It occurs anywhere between her knee and her foot and is worse in the evenings. Also noted after prolonged sitting. No swelling or warmth associated. She's had no recent acute illness. She is trying to maintain a heart healthy diet but has not been significantly physically active. Denies CP/palp/SOB/HA/congestion/fevers/GI or GU c/o. Taking meds as prescribed  Past Medical History  Diagnosis Date  . Chest pain   . Hyperlipidemia   . Sleep apnea   . GERD (gastroesophageal reflux disease)   . Obesity   . At risk for colon cancer     Last colonoscopy in [place] 03/2009 found hyperplastic polyp only  . Osteopenia 08/31/2012  . Unspecified sinusitis (chronic) 03/30/2013  . Allergic state 03/25/2012  . Hiatal hernia with gastroesophageal reflux 04/27/2010    Qualifier: Diagnosis of  By: Danelle Earthly CMA, Darlene  Failed Omeprazole and Protonix   . Atherosclerosis of aorta 04/12/2014  . Aortic calcification 08/27/2014  . Peripheral edema 12/13/2014    Past Surgical History  Procedure Laterality Date  . Dilation and curettage of uterus      Family History  Problem Relation Age of Onset  . Stroke Mother   . Lung cancer Father   . Stroke Sister     36  . Thyroid disease Sister   . Diabetes Sister   . Hypertension Sister   . Hyperlipidemia Sister   . Hypertension Sister   . Sleep apnea Son   . Obesity Son     Social History   Social History  . Marital Status: Married    Spouse Name: N/A  . Number of Children: 2  . Years of Education: N/A   Occupational History  . Accountant    Social History Main Topics  . Smoking status: Never Smoker   .  Smokeless tobacco: Never Used  . Alcohol Use: 0.0 oz/week    0 Standard drinks or equivalent per week     Comment: Rarely  . Drug Use: No  . Sexual Activity: Not on file     Comment: no dietary restrictions, lives with husand, works at [employer]   Other Topics Concern  . Not on file   Social History Narrative    Outpatient Prescriptions Prior to Visit  Medication Sig Dispense Refill  . famotidine (PEPCID) 40 MG tablet TAKE 1 TABLET (40 MG TOTAL) BY MOUTH DAILY. 30 tablet 3  . fish oil-omega-3 fatty acids 1000 MG capsule Take 2 g by mouth daily.    . MULTIPLE VITAMINS PO Take by mouth daily.    . pantoprazole (PROTONIX) 40 MG tablet Take 1 tablet (40 mg total) by mouth 2 (two) times daily before a meal. Take 1 tab in the morning. 60 tablet 11  . rosuvastatin (CRESTOR) 10 MG tablet TAKE 1 TABLET (10 MG TOTAL) BY MOUTH DAILY. 30 tablet 6  . cetirizine (ZYRTEC) 10 MG tablet Take 1 tablet (10 mg total) by mouth daily as needed for allergies. (Patient not taking: Reported on 01/28/2015) 30 tablet 6  . cyclobenzaprine (FLEXERIL) 10 MG tablet Take 1 tablet (10 mg total) by mouth  at bedtime as needed for muscle spasms. (Patient not taking: Reported on 01/28/2015) 30 tablet 0  . furosemide (LASIX) 20 MG tablet Take 1 tablet (20 mg total) by mouth daily. Take for edema or weight gain>3# (Patient not taking: Reported on 01/28/2015) 30 tablet 3   No facility-administered medications prior to visit.    Allergies  Allergen Reactions  . No Known Allergies     Review of Systems  Constitutional: Negative for fever, chills and malaise/fatigue.  HENT: Negative for congestion and hearing loss.   Eyes: Negative for discharge.  Respiratory: Negative for cough, sputum production and shortness of breath.   Cardiovascular: Negative for chest pain, palpitations and leg swelling.  Gastrointestinal: Negative for heartburn, nausea, vomiting, abdominal pain, diarrhea, constipation and blood in stool.  Genitourinary:  Negative for dysuria, urgency, frequency and hematuria.  Musculoskeletal: Positive for myalgias. Negative for back pain and falls.  Skin: Negative for rash.  Neurological: Negative for dizziness, sensory change, loss of consciousness, weakness and headaches.  Endo/Heme/Allergies: Negative for environmental allergies. Does not bruise/bleed easily.  Psychiatric/Behavioral: Negative for depression and suicidal ideas. The patient is not nervous/anxious and does not have insomnia.        Objective:    Physical Exam  Constitutional: She is oriented to person, place, and time. She appears well-developed and well-nourished. No distress.  HENT:  Head: Normocephalic and atraumatic.  Eyes: Conjunctivae are normal.  Neck: Neck supple. No thyromegaly present.  Cardiovascular: Normal rate, regular rhythm and normal heart sounds.   No murmur heard. Pulmonary/Chest: Effort normal and breath sounds normal. No respiratory distress.  Abdominal: Soft. Bowel sounds are normal. She exhibits no distension and no mass. There is no tenderness.  Musculoskeletal: She exhibits no edema.  Lymphadenopathy:    She has no cervical adenopathy.  Neurological: She is alert and oriented to person, place, and time.  Skin: Skin is warm and dry.  Psychiatric: She has a normal mood and affect. Her behavior is normal.    BP 139/64 mmHg  Pulse 64  Temp(Src) 97.5 F (36.4 C) (Oral)  Ht 5\' 2"  (1.575 m)  Wt 176 lb 12.8 oz (80.196 kg)  BMI 32.33 kg/m2  SpO2 96% Wt Readings from Last 3 Encounters:  02/11/15 176 lb 12.8 oz (80.196 kg)  01/28/15 178 lb (80.74 kg)  01/27/15 176 lb 1.9 oz (79.888 kg)     Lab Results  Component Value Date   WBC 5.1 02/11/2015   HGB 13.2 02/11/2015   HCT 40.5 02/11/2015   PLT 236.0 02/11/2015   GLUCOSE 97 02/11/2015   CHOL 188 02/11/2015   TRIG 91.0 02/11/2015   HDL 57.80 02/11/2015   LDLCALC 112* 02/11/2015   ALT 24 02/11/2015   AST 20 02/11/2015   NA 141 02/11/2015   K 4.2  02/11/2015   CL 104 02/11/2015   CREATININE 0.74 02/11/2015   BUN 15 02/11/2015   CO2 32 02/11/2015   TSH 1.33 02/11/2015   HGBA1C 6.1 02/11/2015   MICROALBUR 3.06* 05/16/2011    Lab Results  Component Value Date   TSH 1.33 02/11/2015   Lab Results  Component Value Date   WBC 5.1 02/11/2015   HGB 13.2 02/11/2015   HCT 40.5 02/11/2015   MCV 86.7 02/11/2015   PLT 236.0 02/11/2015   Lab Results  Component Value Date   NA 141 02/11/2015   K 4.2 02/11/2015   CO2 32 02/11/2015   GLUCOSE 97 02/11/2015   BUN 15 02/11/2015   CREATININE 0.74  02/11/2015   BILITOT 0.4 02/11/2015   ALKPHOS 78 02/11/2015   AST 20 02/11/2015   ALT 24 02/11/2015   PROT 7.5 02/11/2015   ALBUMIN 4.4 02/11/2015   CALCIUM 9.6 02/11/2015   ANIONGAP 12 01/12/2014   GFR 84.59 02/11/2015   Lab Results  Component Value Date   CHOL 188 02/11/2015   Lab Results  Component Value Date   HDL 57.80 02/11/2015   Lab Results  Component Value Date   LDLCALC 112* 02/11/2015   Lab Results  Component Value Date   TRIG 91.0 02/11/2015   Lab Results  Component Value Date   CHOLHDL 3 02/11/2015   Lab Results  Component Value Date   HGBA1C 6.1 02/11/2015       Assessment & Plan:   Problem List Items Addressed This Visit    Preventative health care    Patient encouraged to maintain heart healthy diet, regular exercise, adequate sleep. Consider daily probiotics. Take medications as prescribed. Labs ordered and reviewed. Given and reviewed copy of ACP documents from [employer] and encouraged to complete and return      Relevant Orders   CBC (Completed)   Comprehensive metabolic panel (Completed)   Lipid panel (Completed)   TSH (Completed)   Hepatitis C antibody (Completed)   HIV antibody (Completed)   Hyperlipidemia, mixed    Encouraged heart healthy diet, increase exercise, avoid trans fats, consider a krill oil cap daily      Relevant Orders   Lipid panel (Completed)    Hyperglycemia     minimize simple carbs. Increase exercise as tolerated.       Essential hypertension    Well controlled, no changes to meds. Encouraged heart healthy diet such as the DASH diet and exercise as tolerated.       Relevant Orders   CBC (Completed)   Comprehensive metabolic panel (Completed)   TSH (Completed)    Other Visit Diagnoses    History of Helicobacter pylori infection    -  Primary    Relevant Medications    Influenza vac split quadrivalent PF (FLUARIX) injection 0.5 mL (Completed)    Other Relevant Orders    H. pylori breath test    Need for prophylactic vaccination and inoculation against influenza        Relevant Medications    Influenza vac split quadrivalent PF (FLUARIX) injection 0.5 mL (Completed)    Cramp of both lower extremities        Relevant Orders    Magnesium (Completed)    Breast cancer screening        Relevant Orders    MM DIGITAL SCREENING BILATERAL      Given flu shot today I am having Ms. Neils maintain her fish oil-omega-3 fatty acids, MULTIPLE VITAMINS PO, pantoprazole, cetirizine, rosuvastatin, furosemide, cyclobenzaprine, and famotidine. We administered Influenza vac split quadrivalent PF.  Meds ordered this encounter  Medications  . Influenza vac split quadrivalent PF (FLUARIX) injection 0.5 mL    Sig:      Penni Homans, MD

## 2015-02-21 NOTE — Assessment & Plan Note (Signed)
Patient encouraged to maintain heart healthy diet, regular exercise, adequate sleep. Consider daily probiotics. Take medications as prescribed. Labs ordered and reviewed. Given and reviewed copy of ACP documents from [place] Secretary of State and encouraged to complete and return 

## 2015-02-25 ENCOUNTER — Other Ambulatory Visit (INDEPENDENT_AMBULATORY_CARE_PROVIDER_SITE_OTHER): Payer: BLUE CROSS/BLUE SHIELD

## 2015-02-25 DIAGNOSIS — Z8619 Personal history of other infectious and parasitic diseases: Secondary | ICD-10-CM

## 2015-02-26 LAB — H. PYLORI BREATH TEST: H. PYLORI BREATH TEST: DETECTED — AB

## 2015-02-26 MED ORDER — OMEPRAZOLE 20 MG PO CPDR: 20.0000 mg | DELAYED_RELEASE_CAPSULE | Freq: Two times a day (BID) | ORAL | Status: DC

## 2015-02-26 MED ORDER — AMOXICILLIN 500 MG PO CAPS: 1000.0000 mg | ORAL_CAPSULE | Freq: Two times a day (BID) | ORAL | Status: DC

## 2015-02-26 MED ORDER — CLARITHROMYCIN 500 MG PO TABS: 500.0000 mg | ORAL_TABLET | Freq: Two times a day (BID) | ORAL | Status: DC

## 2015-02-26 NOTE — Addendum Note (Signed)
Addended by: Leticia Penna A on: 02/26/2015 04:25 PM   Modules accepted: Orders

## 2015-02-26 NOTE — Progress Notes (Signed)
Quick Note:  Notified patient and she stated understanding of results and medications. Rx sent to [facility] on [address] per patient preference. ______

## 2015-03-01 ENCOUNTER — Telehealth: Payer: Self-pay | Admitting: *Deleted

## 2015-03-01 NOTE — Telephone Encounter (Signed)
PA for omeprazole 20 mg BID initiated. Awaiting determination. JG//CMA

## 2015-03-02 NOTE — Telephone Encounter (Signed)
Approved effective 03/01/2015 through 05/28/2038. Case number: [id number]. Approval letter sent for scanning. JG//CMA

## 2015-03-12 ENCOUNTER — Encounter: Payer: BLUE CROSS/BLUE SHIELD | Admitting: Behavioral Health

## 2015-03-12 NOTE — Progress Notes (Signed)
Pre visit review using our clinic review tool, if applicable. No additional management support is needed unless otherwise documented below in the visit note.  Patient presented today for a Tdap vaccine, but reported that she had the vaccination two years ago when she traveled out of the country. Per the patient, she  will send the office documentation. This encounter was created in error - please disregard.

## 2015-05-06 ENCOUNTER — Ambulatory Visit (INDEPENDENT_AMBULATORY_CARE_PROVIDER_SITE_OTHER): Payer: BLUE CROSS/BLUE SHIELD | Admitting: Family Medicine

## 2015-05-06 ENCOUNTER — Encounter: Payer: Self-pay | Admitting: Family Medicine

## 2015-05-06 VITALS — BP 130/80 | HR 90 | Temp 97.9°F | Ht 62.0 in | Wt 177.4 lb

## 2015-05-06 DIAGNOSIS — Z0000 Encounter for general adult medical examination without abnormal findings: Secondary | ICD-10-CM | POA: Diagnosis not present

## 2015-05-06 DIAGNOSIS — I10 Essential (primary) hypertension: Secondary | ICD-10-CM | POA: Diagnosis not present

## 2015-05-06 DIAGNOSIS — K449 Diaphragmatic hernia without obstruction or gangrene: Secondary | ICD-10-CM

## 2015-05-06 DIAGNOSIS — R739 Hyperglycemia, unspecified: Secondary | ICD-10-CM | POA: Diagnosis not present

## 2015-05-06 DIAGNOSIS — K219 Gastro-esophageal reflux disease without esophagitis: Secondary | ICD-10-CM

## 2015-05-06 DIAGNOSIS — Z1239 Encounter for other screening for malignant neoplasm of breast: Secondary | ICD-10-CM

## 2015-05-06 DIAGNOSIS — R600 Localized edema: Secondary | ICD-10-CM

## 2015-05-06 DIAGNOSIS — R609 Edema, unspecified: Secondary | ICD-10-CM

## 2015-05-06 DIAGNOSIS — E782 Mixed hyperlipidemia: Secondary | ICD-10-CM | POA: Diagnosis not present

## 2015-05-06 DIAGNOSIS — G4730 Sleep apnea, unspecified: Secondary | ICD-10-CM

## 2015-05-06 MED ORDER — FAMOTIDINE 40 MG PO TABS: 20.0000 mg | ORAL_TABLET | Freq: Two times a day (BID) | ORAL | Status: DC

## 2015-05-06 MED ORDER — FUROSEMIDE 20 MG PO TABS: 20.0000 mg | ORAL_TABLET | Freq: Every day | ORAL | Status: DC

## 2015-05-06 MED ORDER — ROSUVASTATIN CALCIUM 10 MG PO TABS: 10.0000 mg | ORAL_TABLET | Freq: Every day | ORAL | Status: DC

## 2015-05-06 MED ORDER — ROSUVASTATIN CALCIUM 10 MG PO TABS: 10.0000 mg | ORAL_TABLET | ORAL | Status: DC

## 2015-05-06 NOTE — Patient Instructions (Signed)
Edema °Edema is an abnormal buildup of fluids in your body tissues. Edema is somewhat dependent on gravity to pull the fluid to the lowest place in your body. That makes the condition more common in the legs and thighs (lower extremities). Painless swelling of the feet and ankles is common and becomes more likely as you get older. It is also common in looser tissues, like around your eyes.  °When the affected area is squeezed, the fluid may move out of that spot and leave a dent for a few moments. This dent is called pitting.  °CAUSES  °There are many possible causes of edema. Eating too much salt and being on your feet or sitting for a long time can cause edema in your legs and ankles. Hot weather may make edema worse. Common medical causes of edema include: °· Heart failure. °· Liver disease. °· Kidney disease. °· Weak blood vessels in your legs. °· Cancer. °· An injury. °· Pregnancy. °· Some medications. °· Obesity.  °SYMPTOMS  °Edema is usually painless. Your skin may look swollen or shiny.  °DIAGNOSIS  °Your health care provider may be able to diagnose edema by asking about your medical history and doing a physical exam. You may need to have tests such as X-rays, an electrocardiogram, or blood tests to check for medical conditions that may cause edema.  °TREATMENT  °Edema treatment depends on the cause. If you have heart, liver, or kidney disease, you need the treatment appropriate for these conditions. General treatment may include: °· Elevation of the affected body part above the level of your heart. °· Compression of the affected body part. Pressure from elastic bandages or support stockings squeezes the tissues and forces fluid back into the blood vessels. This keeps fluid from entering the tissues. °· Restriction of fluid and salt intake. °· Use of a water pill (diuretic). These medications are appropriate only for some types of edema. They pull fluid out of your body and make you urinate more often. This  gets rid of fluid and reduces swelling, but diuretics can have side effects. Only use diuretics as directed by your health care provider. °HOME CARE INSTRUCTIONS  °· Keep the affected body part above the level of your heart when you are lying down.   °· Do not sit still or stand for prolonged periods.   °· Do not put anything directly under your knees when lying down. °· Do not wear constricting clothing or garters on your upper legs.   °· Exercise your legs to work the fluid back into your blood vessels. This may help the swelling go down.   °· Wear elastic bandages or support stockings to reduce ankle swelling as directed by your health care provider.   °· Eat a low-salt diet to reduce fluid if your health care provider recommends it.   °· Only take medicines as directed by your health care provider.  °SEEK MEDICAL CARE IF:  °· Your edema is not responding to treatment. °· You have heart, liver, or kidney disease and notice symptoms of edema. °· You have edema in your legs that does not improve after elevating them.   °· You have sudden and unexplained weight gain. °SEEK IMMEDIATE MEDICAL CARE IF:  °· You develop shortness of breath or chest pain.   °· You cannot breathe when you lie down. °· You develop pain, redness, or warmth in the swollen areas.   °· You have heart, liver, or kidney disease and suddenly get edema. °· You have a fever and your symptoms suddenly get worse. °MAKE SURE YOU:  °·   Understand these instructions. °· Will watch your condition. °· Will get help right away if you are not doing well or get worse. °  °This information is not intended to replace advice given to you by your health care provider. Make sure you discuss any questions you have with your health care provider. °  °Document Released: 05/15/2005 Document Revised: 06/05/2014 Document Reviewed: 03/07/2013 °Elsevier Interactive Patient Education ©2016 Else[employer] Inc[employer] ° °

## 2015-05-06 NOTE — Progress Notes (Signed)
Pre visit review using our clinic review tool, if applicable. No additional management support is needed unless otherwise documented below in the visit note. 

## 2015-05-06 NOTE — Progress Notes (Signed)
Ortensia Motzer [id number] 11/12/1950 05/06/2015      Patient Progress Note   Subjective  Chief Complaint  Chief Complaint  Patient presents with  . Follow-up    HPI  64 year old female presents for routine care. She is still experiencing reflux after every meal despite taking pantoprazole. She is not taking the omeprazole or prilosec. She saw gastroenterology in June but has not followed up and is only taking her medication in the morning. She knows that it was recommended to take it before breakfast and dinner. Patient complains of cramps, but after further discussion it sounds more like myalgias, mostly in calves. Also has generalized hand pain that is worse in the morning. The pain is on the backside of her hand and goes away after movement. She has been taking crestor for 5-6 years. Denies CP/palp/SOB/HA/congestion/fevers/GI or GU c/o. Taking meds as prescribed Patient denies shortness of breath, chest pain,changes in urination, GI issues, recent fevers or illnesses. Mild swelling in feet, but no warmth. Patient is travelling to the [place] at the end of the month and would like to get her vaccinations once she gets back    Past Medical History  Diagnosis Date  . Chest pain   . Hyperlipidemia   . Sleep apnea   . GERD (gastroesophageal reflux disease)   . Obesity   . At risk for colon cancer     Last colonoscopy in [place] 03/2009 found hyperplastic polyp only  . Osteopenia 08/31/2012  . Unspecified sinusitis (chronic) 03/30/2013  . Allergic state 03/25/2012  . Hiatal hernia with gastroesophageal reflux 04/27/2010    Qualifier: Diagnosis of  By: Danelle Earthly CMA, Darlene  Failed Omeprazole and Protonix   . Atherosclerosis of aorta ([place]) 04/12/2014  . Aortic calcification ([place]) 08/27/2014  . Peripheral edema 12/13/2014    Past Surgical History  Procedure Laterality Date  . Dilation and curettage of uterus      Family History  Problem Relation Age of Onset  . Stroke Mother   .  Lung cancer Father   . Stroke Sister     17  . Thyroid disease Sister   . Diabetes Sister   . Hypertension Sister   . Hyperlipidemia Sister   . Hypertension Sister   . Sleep apnea Son   . Obesity Son     Social History   Social History  . Marital Status: Married    Spouse Name: N/A  . Number of Children: 2  . Years of Education: N/A   Occupational History  . Accountant    Social History Main Topics  . Smoking status: Never Smoker   . Smokeless tobacco: Never Used  . Alcohol Use: 0.0 oz/week    0 Standard drinks or equivalent per week     Comment: Rarely  . Drug Use: No  . Sexual Activity: Not on file     Comment: no dietary restrictions, lives with husand, works at [employer]   Other Topics Concern  . Not on file   Social History Narrative    Current Outpatient Prescriptions on File Prior to Visit  Medication Sig Dispense Refill  . famotidine (PEPCID) 40 MG tablet TAKE 1 TABLET (40 MG TOTAL) BY MOUTH DAILY. 30 tablet 3  . fish oil-omega-3 fatty acids 1000 MG capsule Take 2 g by mouth daily.    . MULTIPLE VITAMINS PO Take by mouth daily.    . pantoprazole (PROTONIX) 40 MG tablet Take 1 tablet (40 mg total) by mouth 2 (two) times daily  before a meal. Take 1 tab in the morning. 60 tablet 11  . rosuvastatin (CRESTOR) 10 MG tablet TAKE 1 TABLET (10 MG TOTAL) BY MOUTH DAILY. 30 tablet 6  . omeprazole (PRILOSEC) 20 MG capsule Take 1 capsule (20 mg total) by mouth 2 (two) times daily before a meal. (Patient not taking: Reported on 05/06/2015) 60 capsule 3   No current facility-administered medications on file prior to visit.    Allergies  Allergen Reactions  . No Known Allergies     Review of Systems   Constitutional: Negative for fever and malaise/fatigue.  HENT: Negative for congestion.  Eyes: Negative for discharge.  Respiratory: Negative for shortness of breath.  Cardiovascular: Negative for palpitations and leg swelling. Positive for chest pain (GERD)   Gastrointestinal: Negative for nausea, abdominal pain and diarrhea.  Genitourinary: Negative for dysuria and urgency, hematuria and flank pain.  Musculoskeletal: Positive for myalgias  Skin: Negative for rash.  Neurological: Negative for loss of consciousness and headaches.  Endo/Heme/Allergies: Negative for polydipsia.  Psychiatric/Behavioral: Negative for depression and suicidal ideas. The patient is not nervous/anxious and does not have insomnia.   Objective  BP 130/80 mmHg  Pulse 90  Temp(Src) 97.9 F (36.6 C) (Oral)  Ht 5\' 2"  (1.575 m)  Wt 177 lb 6 oz (80.457 kg)  BMI 32.43 kg/m2  SpO2 95%  Physical Exam   Constitutional: Oriented to person, place, and time. Appears well-nourished. No distress.  Eyes: EOM are normal. Pupils are equal, round, and reactive to light.  Cardiovascular: Normal rate and regular rhythm.  Pulmonary/Chest: Breath sounds normal.  Abdominal: Soft. Bowel sounds are normal.  Lymphadenopathy:   No cervical adenopathy.  Extremities: mild edema in feet, calves, hands bilaterally Neurological: Alert and oriented to person, place, and time. Normal reflexes. No cranial nerve deficit.    Assessment & Plan  Hyperlipidemia -continue statin, will monitor for myalgias -Encouraged heart healthy diet, avoid trans fats, minimize simple carbs and saturated fats.  -Increase exercise as tolerated  Essential hypertension -Well controlled, no changes to meds. -Encouraged heart healthy diet and exercise as tolerated.   GERD -Avoid offending foods, start probiotics.  -Do not eat large meals in late evening and consider raising head of bed.  -Restart pepcid  Obstructive Sleep Apnea -Compliant with CPAP, continue to use  Edema -start lasix 20mg  once daily for 3-5 days then go down to 10mg  once daily -discussed low sodium diet  Preventative -Will look up tetanus documentation  -Would like to get shingles after her month trip -Mammogram  Lab Work  Ordered -CBC, CMP, Lipid panel, A1c

## 2015-05-07 LAB — LIPID PANEL
Cholesterol: 229 mg/dL — ABNORMAL HIGH (ref 0–200)
HDL: 56.8 mg/dL
LDL Cholesterol: 145 mg/dL — ABNORMAL HIGH (ref 0–99)
NonHDL: 171.95
Total CHOL/HDL Ratio: 4
Triglycerides: 135 mg/dL (ref 0.0–149.0)
VLDL: 27 mg/dL (ref 0.0–40.0)

## 2015-05-07 LAB — CBC
HCT: 40.6 % (ref 36.0–46.0)
Hemoglobin: 13.3 g/dL (ref 12.0–15.0)
MCHC: 32.7 g/dL (ref 30.0–36.0)
MCV: 86.8 fl (ref 78.0–100.0)
Platelets: 262 10*3/uL (ref 150.0–400.0)
RBC: 4.68 Mil/uL (ref 3.87–5.11)
RDW: 14.1 % (ref 11.5–15.5)
WBC: 6.7 10*3/uL (ref 4.0–10.5)

## 2015-05-07 LAB — COMPREHENSIVE METABOLIC PANEL
ALT: 31 U/L (ref 0–35)
AST: 25 U/L (ref 0–37)
Albumin: 4.4 g/dL (ref 3.5–5.2)
Alkaline Phosphatase: 76 U/L (ref 39–117)
BUN: 12 mg/dL (ref 6–23)
CALCIUM: 9.8 mg/dL (ref 8.4–10.5)
CHLORIDE: 105 meq/L (ref 96–112)
CO2: 29 meq/L (ref 19–32)
Creatinine, Ser: 0.68 mg/dL (ref 0.40–1.20)
GFR: 93.19 mL/min (ref >60.00)
GLUCOSE: 76 mg/dL (ref 70–99)
Potassium: 3.8 mEq/L (ref 3.5–5.1)
Sodium: 142 mEq/L (ref 135–145)
Total Bilirubin: 0.4 mg/dL (ref 0.2–1.2)
Total Protein: 7.4 g/dL (ref 6.0–8.3)

## 2015-05-07 LAB — HEMOGLOBIN A1C: HEMOGLOBIN A1C: 6 % (ref 4.6–6.5)

## 2015-05-07 LAB — TSH: TSH: 1.23 u[IU]/mL (ref 0.35–4.50)

## 2015-05-07 LAB — RHEUMATOID FACTOR: Rhuematoid fact SerPl-aCnc: <10 IU/mL (ref <=14)

## 2015-05-09 ENCOUNTER — Encounter: Payer: Self-pay | Admitting: Family Medicine

## 2015-05-09 NOTE — Assessment & Plan Note (Signed)
Well controlled, no changes to meds. Encouraged heart healthy diet such as the DASH diet and exercise as tolerated.  °

## 2015-05-09 NOTE — Assessment & Plan Note (Signed)
minimize simple carbs. Increase exercise as tolerated.  

## 2015-05-09 NOTE — Assessment & Plan Note (Signed)
Pantoprazole not working well

## 2015-05-09 NOTE — Progress Notes (Signed)
Subjective:    Patient ID: Margaret [name], female    DOB: October 29, 1950, 64 y.o.   MRN: [id number]  Chief Complaint  Patient presents with  . Follow-up    HPI Patient is in today for follow-up. Overall she is doing well but she is complaining of some fatigue and intermittent peripheral edema worse over the last few days. No recent illness. No fevers. Is trying to stay active and maintain a heart healthy diet. Denies CP/palp/SOB/HA/congestion/fevers/GI or GU c/o. Taking meds as prescribed  Past Medical History  Diagnosis Date  . Chest pain   . Hyperlipidemia   . Sleep apnea   . GERD (gastroesophageal reflux disease)   . Obesity   . At risk for colon cancer     Last colonoscopy in [place] 03/2009 found hyperplastic polyp only  . Osteopenia 08/31/2012  . Unspecified sinusitis (chronic) 03/30/2013  . Allergic state 03/25/2012  . Hiatal hernia with gastroesophageal reflux 04/27/2010    Qualifier: Diagnosis of  By: Danelle Earthly CMA, Darlene  Failed Omeprazole and Protonix   . Atherosclerosis of aorta ([place]) 04/12/2014  . Aortic calcification ([place]) 08/27/2014  . Peripheral edema 12/13/2014    Past Surgical History  Procedure Laterality Date  . Dilation and curettage of uterus      Family History  Problem Relation Age of Onset  . Stroke Mother   . Lung cancer Father   . Stroke Sister     39  . Thyroid disease Sister   . Diabetes Sister   . Hypertension Sister   . Hyperlipidemia Sister   . Hypertension Sister   . Sleep apnea Son   . Obesity Son     Social History   Social History  . Marital Status: Married    Spouse Name: N/A  . Number of Children: 2  . Years of Education: N/A   Occupational History  . Accountant    Social History Main Topics  . Smoking status: Never Smoker   . Smokeless tobacco: Never Used  . Alcohol Use: 0.0 oz/week    0 Standard drinks or equivalent per week     Comment: Rarely  . Drug Use: No  . Sexual Activity: Not on file     Comment: no dietary  restrictions, lives with husand, works at [employer]   Other Topics Concern  . Not on file   Social History Narrative    Outpatient Prescriptions Prior to Visit  Medication Sig Dispense Refill  . fish oil-omega-3 fatty acids 1000 MG capsule Take 2 g by mouth daily.    . MULTIPLE VITAMINS PO Take by mouth daily.    . cetirizine (ZYRTEC) 10 MG tablet Take 1 tablet (10 mg total) by mouth daily as needed for allergies. 30 tablet 6  . famotidine (PEPCID) 40 MG tablet TAKE 1 TABLET (40 MG TOTAL) BY MOUTH DAILY. 30 tablet 3  . pantoprazole (PROTONIX) 40 MG tablet Take 1 tablet (40 mg total) by mouth 2 (two) times daily before a meal. Take 1 tab in the morning. 60 tablet 11  . rosuvastatin (CRESTOR) 10 MG tablet TAKE 1 TABLET (10 MG TOTAL) BY MOUTH DAILY. 30 tablet 6  . omeprazole (PRILOSEC) 20 MG capsule Take 1 capsule (20 mg total) by mouth 2 (two) times daily before a meal. (Patient not taking: Reported on 05/06/2015) 60 capsule 3  . amoxicillin (AMOXIL) 500 MG capsule Take 2 capsules (1,000 mg total) by mouth 2 (two) times daily. 40 capsule 0  . clarithromycin (BIAXIN) 500 MG  tablet Take 1 tablet (500 mg total) by mouth 2 (two) times daily. 20 tablet 0  . cyclobenzaprine (FLEXERIL) 10 MG tablet Take 1 tablet (10 mg total) by mouth at bedtime as needed for muscle spasms. (Patient not taking: Reported on 01/28/2015) 30 tablet 0  . furosemide (LASIX) 20 MG tablet Take 1 tablet (20 mg total) by mouth daily. Take for edema or weight gain>3# (Patient not taking: Reported on 01/28/2015) 30 tablet 3   No facility-administered medications prior to visit.    Allergies  Allergen Reactions  . No Known Allergies     Review of Systems  Constitutional: Negative for fever and malaise/fatigue.  HENT: Negative for congestion.   Eyes: Negative for discharge.  Respiratory: Negative for shortness of breath.   Cardiovascular: Positive for leg swelling. Negative for chest pain and palpitations.  Gastrointestinal:  Negative for nausea and abdominal pain.  Genitourinary: Negative for dysuria.  Musculoskeletal: Negative for falls.  Skin: Negative for rash.  Neurological: Negative for loss of consciousness and headaches.  Endo/Heme/Allergies: Negative for environmental allergies.  Psychiatric/Behavioral: Negative for depression. The patient is not nervous/anxious.        Objective:    Physical Exam  Constitutional: She is oriented to person, place, and time. She appears well-developed and well-nourished. No distress.  HENT:  Head: Normocephalic and atraumatic.  Nose: Nose normal.  Eyes: Right eye exhibits no discharge. Left eye exhibits no discharge.  Neck: Normal range of motion. Neck supple.  Cardiovascular: Normal rate and regular rhythm.   No murmur heard. Pulmonary/Chest: Effort normal and breath sounds normal.  Abdominal: Soft. Bowel sounds are normal. There is no tenderness.  Musculoskeletal: She exhibits edema.  Tr to 1 + edema b/l  Neurological: She is alert and oriented to person, place, and time.  Skin: Skin is warm and dry.  Psychiatric: She has a normal mood and affect.  Nursing note and vitals reviewed.   BP 130/80 mmHg  Pulse 90  Temp(Src) 97.9 F (36.6 C) (Oral)  Ht 5\' 2"  (1.575 m)  Wt 177 lb 6 oz (80.457 kg)  BMI 32.43 kg/m2  SpO2 95% Wt Readings from Last 3 Encounters:  05/06/15 177 lb 6 oz (80.457 kg)  02/11/15 176 lb 12.8 oz (80.196 kg)  01/28/15 178 lb (80.74 kg)     Lab Results  Component Value Date   WBC 6.7 05/06/2015   HGB 13.3 05/06/2015   HCT 40.6 05/06/2015   PLT 262.0 05/06/2015   GLUCOSE 76 05/06/2015   CHOL 229* 05/06/2015   TRIG 135.0 05/06/2015   HDL 56.80 05/06/2015   LDLCALC 145* 05/06/2015   ALT 31 05/06/2015   AST 25 05/06/2015   NA 142 05/06/2015   K 3.8 05/06/2015   CL 105 05/06/2015   CREATININE 0.68 05/06/2015   BUN 12 05/06/2015   CO2 29 05/06/2015   TSH 1.23 05/06/2015   HGBA1C 6.0 05/06/2015   MICROALBUR 3.06* 05/16/2011     Lab Results  Component Value Date   TSH 1.23 05/06/2015   Lab Results  Component Value Date   WBC 6.7 05/06/2015   HGB 13.3 05/06/2015   HCT 40.6 05/06/2015   MCV 86.8 05/06/2015   PLT 262.0 05/06/2015   Lab Results  Component Value Date   NA 142 05/06/2015   K 3.8 05/06/2015   CO2 29 05/06/2015   GLUCOSE 76 05/06/2015   BUN 12 05/06/2015   CREATININE 0.68 05/06/2015   BILITOT 0.4 05/06/2015   ALKPHOS 76 05/06/2015  AST 25 05/06/2015   ALT 31 05/06/2015   PROT 7.4 05/06/2015   ALBUMIN 4.4 05/06/2015   CALCIUM 9.8 05/06/2015   ANIONGAP 12 01/12/2014   GFR 93.19 05/06/2015   Lab Results  Component Value Date   CHOL 229* 05/06/2015   Lab Results  Component Value Date   HDL 56.80 05/06/2015   Lab Results  Component Value Date   LDLCALC 145* 05/06/2015   Lab Results  Component Value Date   TRIG 135.0 05/06/2015   Lab Results  Component Value Date   CHOLHDL 4 05/06/2015   Lab Results  Component Value Date   HGBA1C 6.0 05/06/2015       Assessment & Plan:   Problem List Items Addressed This Visit    Essential hypertension - Primary    Well controlled, no changes to meds. Encouraged heart healthy diet such as the DASH diet and exercise as tolerated.       Relevant Medications   famotidine (PEPCID) 40 MG tablet   furosemide (LASIX) 20 MG tablet   rosuvastatin (CRESTOR) 10 MG tablet   Other Relevant Orders   CBC (Completed)   TSH (Completed)   Lipid panel (Completed)   Hemoglobin A1c (Completed)   Comprehensive metabolic panel (Completed)   Rheumatoid Factor (Completed)   Hiatal hernia with gastroesophageal reflux    Pantoprazole not working well      Relevant Medications   famotidine (PEPCID) 40 MG tablet   Hyperglycemia    minimize simple carbs. Increase exercise as tolerated.       Hyperlipidemia, mixed    Encouraged heart healthy diet, increase exercise, avoid trans fats, consider a krill oil cap daily, tolerating Crestor       Relevant Medications   furosemide (LASIX) 20 MG tablet   rosuvastatin (CRESTOR) 10 MG tablet   Peripheral edema    Minimize sodium, elevate feet and use Lasix 40 mg x several days then drop to 20 mg daily as needed.      Preventative health care   Sleep apnea    Using CPAP regularly       Other Visit Diagnoses    Pedal edema        Relevant Medications    furosemide (LASIX) 20 MG tablet    Breast cancer screening        Relevant Orders    MM Digital Screening       I have discontinued Ms. Dott pantoprazole, cetirizine, rosuvastatin, cyclobenzaprine, clarithromycin, amoxicillin, and rosuvastatin. I have also changed her famotidine and rosuvastatin. Additionally, I am having her maintain her fish oil-omega-3 fatty acids, MULTIPLE VITAMINS PO, omeprazole, and furosemide.  Meds ordered this encounter  Medications  . famotidine (PEPCID) 40 MG tablet    Sig: Take 0.5 tablets (20 mg total) by mouth 2 (two) times daily.    Dispense:  30 tablet    Refill:  3  . furosemide (LASIX) 20 MG tablet    Sig: Take 1 tablet (20 mg total) by mouth daily. Take for edema or weight gain>3#    Dispense:  30 tablet    Refill:  3  . DISCONTD: rosuvastatin (CRESTOR) 10 MG tablet    Sig: Take 1 tablet (10 mg total) by mouth every other day. TAKE 1 TABLET (10 MG TOTAL) BY MOUTH DAILY.    Dispense:  30 tablet    Refill:  6  . DISCONTD: rosuvastatin (CRESTOR) 10 MG tablet    Sig: Take 10 mg by mouth daily.  . rosuvastatin (  CRESTOR) 10 MG tablet    Sig: Take 1 tablet (10 mg total) by mouth daily.    Dispense:  30 tablet    Refill:  6     Penni Homans, MD

## 2015-05-09 NOTE — Assessment & Plan Note (Addendum)
Encouraged heart healthy diet, increase exercise, avoid trans fats, consider a krill oil cap daily, tolerating Crestor

## 2015-05-09 NOTE — Assessment & Plan Note (Signed)
Using CPAP regularly

## 2015-05-09 NOTE — Assessment & Plan Note (Signed)
Minimize sodium, elevate feet and use Lasix 40 mg x several days then drop to 20 mg daily as needed.

## 2015-05-10 ENCOUNTER — Telehealth: Payer: Self-pay | Admitting: Family Medicine

## 2015-05-10 NOTE — Telephone Encounter (Signed)
-----   Message from Quintin Alto sent at 05/10/2015  2:12 PM EST ----- Regarding: Returned Call Contact: [phone] Patient returning call from Dr. Jacinto Reap

## 2015-05-10 NOTE — Telephone Encounter (Signed)
[id number] ° °

## 2015-05-10 NOTE — Telephone Encounter (Signed)
Patient informed of lab results/PCP instructions. Lab results from 05/06/2015.

## 2015-05-10 NOTE — Telephone Encounter (Signed)
Called left message to call back 

## 2015-08-09 ENCOUNTER — Telehealth: Payer: Self-pay | Admitting: Family Medicine

## 2015-08-09 DIAGNOSIS — R739 Hyperglycemia, unspecified: Secondary | ICD-10-CM

## 2015-08-09 DIAGNOSIS — I10 Essential (primary) hypertension: Secondary | ICD-10-CM

## 2015-08-09 DIAGNOSIS — E785 Hyperlipidemia, unspecified: Secondary | ICD-10-CM

## 2015-08-09 NOTE — Telephone Encounter (Signed)
She should have appt in next 1-2 months with labs prior, lipid, cmp, cbc, tsh, hgba1c

## 2015-08-09 NOTE — Telephone Encounter (Signed)
Patient called back and schedule lab appt.

## 2015-08-09 NOTE — Telephone Encounter (Signed)
Labs have been ordered/dated 09/09/2015. Called the patient left a message to call back to schedule a lab appt. And and OV with PCP.

## 2015-08-09 NOTE — Telephone Encounter (Signed)
Pt says that she need to schedule a 3 month FU appt. Pt would like to know if she need labs completed first?    CB: [phone]

## 2015-08-30 ENCOUNTER — Other Ambulatory Visit (INDEPENDENT_AMBULATORY_CARE_PROVIDER_SITE_OTHER): Payer: BLUE CROSS/BLUE SHIELD

## 2015-08-30 DIAGNOSIS — I10 Essential (primary) hypertension: Secondary | ICD-10-CM | POA: Diagnosis not present

## 2015-08-30 DIAGNOSIS — E785 Hyperlipidemia, unspecified: Secondary | ICD-10-CM

## 2015-08-30 DIAGNOSIS — R739 Hyperglycemia, unspecified: Secondary | ICD-10-CM | POA: Diagnosis not present

## 2015-08-30 LAB — LIPID PANEL
CHOLESTEROL: 151 mg/dL (ref 0–200)
HDL: 42.7 mg/dL (ref >39.00)
LDL Cholesterol: 83 mg/dL (ref 0–99)
NonHDL: 108.74
TRIGLYCERIDES: 130 mg/dL (ref 0.0–149.0)
Total CHOL/HDL Ratio: 4
VLDL: 26 mg/dL (ref 0.0–40.0)

## 2015-08-30 LAB — CBC WITH DIFFERENTIAL/PLATELET
BASOS ABS: 0 10*3/uL (ref 0.0–0.1)
Basophils Relative: 0.7 % (ref 0.0–3.0)
EOS ABS: 0.3 10*3/uL (ref 0.0–0.7)
Eosinophils Relative: 5.7 % — ABNORMAL HIGH (ref 0.0–5.0)
HEMATOCRIT: 37.6 % (ref 36.0–46.0)
Hemoglobin: 12.4 g/dL (ref 12.0–15.0)
LYMPHS ABS: 2.2 10*3/uL (ref 0.7–4.0)
LYMPHS PCT: 38.5 % (ref 12.0–46.0)
MCHC: 33 g/dL (ref 30.0–36.0)
MCV: 86.3 fl (ref 78.0–100.0)
Monocytes Absolute: 0.5 10*3/uL (ref 0.1–1.0)
Monocytes Relative: 9.4 % (ref 3.0–12.0)
NEUTROS ABS: 2.6 10*3/uL (ref 1.4–7.7)
NEUTROS PCT: 45.7 % (ref 43.0–77.0)
PLATELETS: 221 10*3/uL (ref 150.0–400.0)
RBC: 4.36 Mil/uL (ref 3.87–5.11)
RDW: 14.4 % (ref 11.5–15.5)
WBC: 5.7 10*3/uL (ref 4.0–10.5)

## 2015-08-30 LAB — COMPREHENSIVE METABOLIC PANEL
ALBUMIN: 3.9 g/dL (ref 3.5–5.2)
ALK PHOS: 72 U/L (ref 39–117)
ALT: 27 U/L (ref 0–35)
AST: 19 U/L (ref 0–37)
BILIRUBIN TOTAL: 0.4 mg/dL (ref 0.2–1.2)
BUN: 14 mg/dL (ref 6–23)
CALCIUM: 9.2 mg/dL (ref 8.4–10.5)
CO2: 30 mEq/L (ref 19–32)
Chloride: 105 mEq/L (ref 96–112)
Creatinine, Ser: 0.6 mg/dL (ref 0.40–1.20)
GFR: 107.56 mL/min (ref >60.00)
Glucose, Bld: 99 mg/dL (ref 70–99)
Potassium: 4.3 mEq/L (ref 3.5–5.1)
Sodium: 140 mEq/L (ref 135–145)
TOTAL PROTEIN: 6.9 g/dL (ref 6.0–8.3)

## 2015-08-30 LAB — TSH: TSH: 1.04 u[IU]/mL (ref 0.35–4.50)

## 2015-08-30 LAB — HEMOGLOBIN A1C: HEMOGLOBIN A1C: 6.2 % (ref 4.6–6.5)

## 2015-08-31 ENCOUNTER — Ambulatory Visit (INDEPENDENT_AMBULATORY_CARE_PROVIDER_SITE_OTHER): Payer: BLUE CROSS/BLUE SHIELD | Admitting: Family Medicine

## 2015-08-31 ENCOUNTER — Encounter: Payer: Self-pay | Admitting: Family Medicine

## 2015-08-31 ENCOUNTER — Ambulatory Visit: Payer: BLUE CROSS/BLUE SHIELD | Admitting: Family Medicine

## 2015-08-31 VITALS — BP 132/56 | HR 70 | Temp 98.8°F | Ht 62.0 in | Wt 177.2 lb

## 2015-08-31 DIAGNOSIS — K449 Diaphragmatic hernia without obstruction or gangrene: Secondary | ICD-10-CM

## 2015-08-31 DIAGNOSIS — M5442 Lumbago with sciatica, left side: Secondary | ICD-10-CM

## 2015-08-31 DIAGNOSIS — Z1239 Encounter for other screening for malignant neoplasm of breast: Secondary | ICD-10-CM

## 2015-08-31 DIAGNOSIS — E782 Mixed hyperlipidemia: Secondary | ICD-10-CM

## 2015-08-31 DIAGNOSIS — K219 Gastro-esophageal reflux disease without esophagitis: Secondary | ICD-10-CM

## 2015-08-31 DIAGNOSIS — M546 Pain in thoracic spine: Secondary | ICD-10-CM

## 2015-08-31 DIAGNOSIS — R739 Hyperglycemia, unspecified: Secondary | ICD-10-CM

## 2015-08-31 DIAGNOSIS — I10 Essential (primary) hypertension: Secondary | ICD-10-CM

## 2015-08-31 DIAGNOSIS — M5441 Lumbago with sciatica, right side: Secondary | ICD-10-CM | POA: Diagnosis not present

## 2015-08-31 DIAGNOSIS — R1013 Epigastric pain: Secondary | ICD-10-CM

## 2015-08-31 DIAGNOSIS — M8580 Other specified disorders of bone density and structure, unspecified site: Secondary | ICD-10-CM

## 2015-08-31 NOTE — Patient Instructions (Addendum)
[employer]akes 10 strain probiotic daily, [employer]  Food Choices for Gastroesophageal Reflux Disease, Adult When you have gastroesophageal reflux disease (GERD), the foods you eat and your eating habits are very important. Choosing the right foods can help ease the discomfort of GERD. WHAT GENERAL GUIDELINES DO I NEED TO FOLLOW?  Choose fruits, vegetables, whole grains, low-fat dairy products, and low-fat meat, fish, and poultry.  Limit fats such as oils, salad dressings, butter, nuts, and avocado.  Keep a food diary to identify foods that cause symptoms.  Avoid foods that cause reflux. These may be different for different people.  Eat frequent small meals instead of three large meals each day.  Eat your meals slowly, in a relaxed setting.  Limit fried foods.  Cook foods using methods other than frying.  Avoid drinking alcohol.  Avoid drinking large amounts of liquids with your meals.  Avoid bending over or lying down until 2-3 hours after eating. WHAT FOODS ARE NOT RECOMMENDED? The following are some foods and drinks that may worsen your symptoms: Vegetables Tomatoes. Tomato juice. Tomato and spaghetti sauce. Chili peppers. Onion and garlic. Horseradish. Fruits Oranges, grapefruit, and lemon (fruit and juice). Meats High-fat meats, fish, and poultry. This includes hot dogs, ribs, ham, sausage, salami, and bacon. Dairy Whole milk and chocolate milk. Sour cream. Cream. Butter. Ice cream. Cream cheese.  Beverages Coffee and tea, with or without caffeine. Carbonated beverages or energy drinks. Condiments Hot sauce. Barbecue sauce.  Sweets/Desserts Chocolate and cocoa. Donuts. Peppermint and spearmint. Fats and Oils High-fat foods, including [place] fries and potato chips. Other Vinegar. Strong spices, such as black pepper, white pepper, red pepper, cayenne, curry powder, cloves, ginger, and chili powder. The items listed above may not be a complete list of foods and  beverages to avoid. Contact your dietitian for more information.   This information is not intended to replace advice given to you by your health care provider. Make sure you discuss any questions you have with your health care provider.   Document Released: 05/15/2005 Document Revised: 06/05/2014 Document Reviewed: 03/19/2013 Elsevier Interactive Patient Education [employer].

## 2015-08-31 NOTE — Progress Notes (Signed)
Pre visit review using our clinic review tool, if applicable. No additional management support is needed unless otherwise documented below in the visit note. 

## 2015-09-02 ENCOUNTER — Telehealth: Payer: Self-pay | Admitting: Gastroenterology

## 2015-09-02 NOTE — Telephone Encounter (Signed)
I have left a message for the patient to call back. Can make an appointment with Dr Silverio Decamp to establish.

## 2015-09-02 NOTE — Telephone Encounter (Signed)
Please advise 

## 2015-09-02 NOTE — Telephone Encounter (Signed)
That is ok with me  

## 2015-09-02 NOTE — Telephone Encounter (Signed)
ok 

## 2015-09-02 NOTE — Telephone Encounter (Signed)
Beth see below

## 2015-09-02 NOTE — Telephone Encounter (Signed)
Dr Silverio Decamp is this ok with you?

## 2015-09-03 ENCOUNTER — Ambulatory Visit (HOSPITAL_BASED_OUTPATIENT_CLINIC_OR_DEPARTMENT_OTHER)
Admission: RE | Admit: 2015-09-03 | Discharge: 2015-09-03 | Disposition: A | Discharge status: Home / Self Care | Payer: BLUE CROSS/BLUE SHIELD | Source: Ambulatory Visit | Attending: Family Medicine | Admitting: Family Medicine

## 2015-09-03 ENCOUNTER — Other Ambulatory Visit (HOSPITAL_BASED_OUTPATIENT_CLINIC_OR_DEPARTMENT_OTHER): Payer: Self-pay | Admitting: Internal Medicine

## 2015-09-03 DIAGNOSIS — R1013 Epigastric pain: Secondary | ICD-10-CM | POA: Diagnosis not present

## 2015-09-03 DIAGNOSIS — Z9289 Personal history of other medical treatment: Secondary | ICD-10-CM

## 2015-09-03 DIAGNOSIS — M4184 Other forms of scoliosis, thoracic region: Secondary | ICD-10-CM | POA: Diagnosis not present

## 2015-09-03 DIAGNOSIS — M5441 Lumbago with sciatica, right side: Secondary | ICD-10-CM

## 2015-09-03 DIAGNOSIS — Z1239 Encounter for other screening for malignant neoplasm of breast: Secondary | ICD-10-CM

## 2015-09-03 DIAGNOSIS — M5442 Lumbago with sciatica, left side: Secondary | ICD-10-CM

## 2015-09-03 DIAGNOSIS — M8580 Other specified disorders of bone density and structure, unspecified site: Secondary | ICD-10-CM | POA: Insufficient documentation

## 2015-09-03 DIAGNOSIS — M546 Pain in thoracic spine: Secondary | ICD-10-CM | POA: Diagnosis not present

## 2015-09-03 DIAGNOSIS — K760 Fatty (change of) liver, not elsewhere classified: Secondary | ICD-10-CM | POA: Insufficient documentation

## 2015-09-03 DIAGNOSIS — K7689 Other specified diseases of liver: Secondary | ICD-10-CM | POA: Insufficient documentation

## 2015-09-07 ENCOUNTER — Ambulatory Visit (HOSPITAL_BASED_OUTPATIENT_CLINIC_OR_DEPARTMENT_OTHER)
Admission: RE | Admit: 2015-09-07 | Discharge: 2015-09-07 | Disposition: A | Discharge status: Home / Self Care | Payer: BLUE CROSS/BLUE SHIELD | Source: Ambulatory Visit | Attending: Family Medicine | Admitting: Family Medicine

## 2015-09-07 DIAGNOSIS — Z1231 Encounter for screening mammogram for malignant neoplasm of breast: Secondary | ICD-10-CM | POA: Diagnosis present

## 2015-09-07 DIAGNOSIS — M5442 Lumbago with sciatica, left side: Secondary | ICD-10-CM | POA: Diagnosis not present

## 2015-09-07 DIAGNOSIS — M546 Pain in thoracic spine: Secondary | ICD-10-CM | POA: Diagnosis not present

## 2015-09-07 DIAGNOSIS — R1013 Epigastric pain: Secondary | ICD-10-CM | POA: Insufficient documentation

## 2015-09-07 DIAGNOSIS — Z1239 Encounter for other screening for malignant neoplasm of breast: Secondary | ICD-10-CM

## 2015-09-07 DIAGNOSIS — M5441 Lumbago with sciatica, right side: Secondary | ICD-10-CM | POA: Insufficient documentation

## 2015-09-12 NOTE — Assessment & Plan Note (Signed)
Encouraged to get adequate exercise, calcium and vitamin d intake 

## 2015-09-12 NOTE — Assessment & Plan Note (Signed)
hgba1c acceptable, minimize simple carbs. Increase exercise as tolerated. Continue current meds 

## 2015-09-12 NOTE — Assessment & Plan Note (Signed)
Epigastric pain occuring after eating, ultrasound is negative for gallstones, she will report if worsens

## 2015-09-12 NOTE — Progress Notes (Signed)
Patient ID: Margaret [name], female   DOB: 03/03/1951, 65 y.o.   MRN: [id number]   Subjective:    Patient ID: Margaret [name], female    DOB: May 15, 1951, 65 y.o.   MRN: [id number]  Chief Complaint  Patient presents with  . Follow-up    HPI Patient is in today for follow up. She continues to struggle with  Low back pain intermittently. No radiculopathy or incontinence. Does note some epigastric pain after eating but denies dyspepsia. Denies CP/palp/SOB/HA/congestion/fevers/GI or GU c/o. Taking meds as prescribed  Past Medical History  Diagnosis Date  . Chest pain   . Hyperlipidemia   . Sleep apnea   . GERD (gastroesophageal reflux disease)   . Obesity   . At risk for colon cancer     Last colonoscopy in [place] 03/2009 found hyperplastic polyp only  . Osteopenia 08/31/2012  . Unspecified sinusitis (chronic) 03/30/2013  . Allergic state 03/25/2012  . Hiatal hernia with gastroesophageal reflux 04/27/2010    Qualifier: Diagnosis of  By: Danelle Earthly CMA, Darlene  Failed Omeprazole and Protonix   . Atherosclerosis of aorta ([place]) 04/12/2014  . Aortic calcification ([place]) 08/27/2014  . Peripheral edema 12/13/2014    Past Surgical History  Procedure Laterality Date  . Dilation and curettage of uterus      Family History  Problem Relation Age of Onset  . Stroke Mother   . Lung cancer Father   . Stroke Sister     3  . Thyroid disease Sister   . Diabetes Sister   . Hypertension Sister   . Hyperlipidemia Sister   . Hypertension Sister   . Sleep apnea Son   . Obesity Son     Social History   Social History  . Marital Status: Married    Spouse Name: N/A  . Number of Children: 2  . Years of Education: N/A   Occupational History  . Accountant    Social History Main Topics  . Smoking status: Never Smoker   . Smokeless tobacco: Never Used  . Alcohol Use: 0.0 oz/week    0 Standard drinks or equivalent per week     Comment: Rarely  . Drug Use: No  . Sexual Activity: Not on file   Comment: no dietary restrictions, lives with husand, works at [employer]   Other Topics Concern  . Not on file   Social History Narrative    Outpatient Prescriptions Prior to Visit  Medication Sig Dispense Refill  . famotidine (PEPCID) 40 MG tablet Take 0.5 tablets (20 mg total) by mouth 2 (two) times daily. 30 tablet 3  . fish oil-omega-3 fatty acids 1000 MG capsule Take 2 g by mouth daily.    . MULTIPLE VITAMINS PO Take by mouth daily.    Marland Kitchen omeprazole (PRILOSEC) 20 MG capsule Take 1 capsule (20 mg total) by mouth 2 (two) times daily before a meal. 60 capsule 3  . rosuvastatin (CRESTOR) 10 MG tablet Take 1 tablet (10 mg total) by mouth daily. 30 tablet 6  . furosemide (LASIX) 20 MG tablet Take 1 tablet (20 mg total) by mouth daily. Take for edema or weight gain>3# (Patient not taking: Reported on 08/31/2015) 30 tablet 3   No facility-administered medications prior to visit.    Allergies  Allergen Reactions  . No Known Allergies     Review of Systems  Constitutional: Negative for fever and malaise/fatigue.  HENT: Negative for congestion.   Eyes: Negative for blurred vision.  Respiratory: Negative for shortness of breath.  Cardiovascular: Negative for chest pain, palpitations and leg swelling.  Gastrointestinal: Positive for abdominal pain. Negative for nausea and blood in stool.  Genitourinary: Negative for dysuria and frequency.  Musculoskeletal: Positive for back pain. Negative for falls.  Skin: Negative for rash.  Neurological: Negative for dizziness, loss of consciousness and headaches.  Endo/Heme/Allergies: Negative for environmental allergies.  Psychiatric/Behavioral: Negative for depression. The patient is not nervous/anxious.        Objective:    Physical Exam  Constitutional: She is oriented to person, place, and time. She appears well-developed and well-nourished. No distress.  HENT:  Head: Normocephalic and atraumatic.  Nose: Nose normal.  Eyes: Right eye exhibits  no discharge. Left eye exhibits no discharge.  Neck: Normal range of motion. Neck supple.  Cardiovascular: Normal rate and regular rhythm.   No murmur heard. Pulmonary/Chest: Effort normal and breath sounds normal.  Abdominal: Soft. Bowel sounds are normal. There is no tenderness.  Musculoskeletal: She exhibits no edema.  Neurological: She is alert and oriented to person, place, and time.  Skin: Skin is warm and dry.  Psychiatric: She has a normal mood and affect.  Nursing note and vitals reviewed.   BP 132/56 mmHg  Pulse 70  Temp(Src) 98.8 F (37.1 C) (Oral)  Ht 5\' 2"  (1.575 m)  Wt 177 lb 4 oz (80.4 kg)  BMI 32.41 kg/m2  SpO2 98% Wt Readings from Last 3 Encounters:  08/31/15 177 lb 4 oz (80.4 kg)  05/06/15 177 lb 6 oz (80.457 kg)  02/11/15 176 lb 12.8 oz (80.196 kg)     Lab Results  Component Value Date   WBC 5.7 08/30/2015   HGB 12.4 08/30/2015   HCT 37.6 08/30/2015   PLT 221.0 08/30/2015   GLUCOSE 99 08/30/2015   CHOL 151 08/30/2015   TRIG 130.0 08/30/2015   HDL 42.70 08/30/2015   LDLCALC 83 08/30/2015   ALT 27 08/30/2015   AST 19 08/30/2015   NA 140 08/30/2015   K 4.3 08/30/2015   CL 105 08/30/2015   CREATININE 0.60 08/30/2015   BUN 14 08/30/2015   CO2 30 08/30/2015   TSH 1.04 08/30/2015   HGBA1C 6.2 08/30/2015   MICROALBUR 3.06* 05/16/2011    Lab Results  Component Value Date   TSH 1.04 08/30/2015   Lab Results  Component Value Date   WBC 5.7 08/30/2015   HGB 12.4 08/30/2015   HCT 37.6 08/30/2015   MCV 86.3 08/30/2015   PLT 221.0 08/30/2015   Lab Results  Component Value Date   NA 140 08/30/2015   K 4.3 08/30/2015   CO2 30 08/30/2015   GLUCOSE 99 08/30/2015   BUN 14 08/30/2015   CREATININE 0.60 08/30/2015   BILITOT 0.4 08/30/2015   ALKPHOS 72 08/30/2015   AST 19 08/30/2015   ALT 27 08/30/2015   PROT 6.9 08/30/2015   ALBUMIN 3.9 08/30/2015   CALCIUM 9.2 08/30/2015   ANIONGAP 12 01/12/2014   GFR 107.56 08/30/2015   Lab Results    Component Value Date   CHOL 151 08/30/2015   Lab Results  Component Value Date   HDL 42.70 08/30/2015   Lab Results  Component Value Date   LDLCALC 83 08/30/2015   Lab Results  Component Value Date   TRIG 130.0 08/30/2015   Lab Results  Component Value Date   CHOLHDL 4 08/30/2015   Lab Results  Component Value Date   HGBA1C 6.2 08/30/2015       Assessment & Plan:   Problem List  Items Addressed This Visit    Back pain   Relevant Orders   DG Lumbar Spine Complete (Completed)   DG Thoracic Spine 4V (Completed)   US Abdomen Complete (Completed)   MM DIGITAL SCREENING BILATERAL (Completed)   Ambulatory referral to Gastroenterology   DG Lumbar Spine Complete (Completed)   DG Thoracic Spine 4V (Completed)   US Abdomen Complete (Completed)   MM DIGITAL SCREENING BILATERAL (Completed)   Ambulatory referral to Gastroenterology   Essential hypertension    Well controlled, no changes to meds. Encouraged heart healthy diet such as the DASH diet and exercise as tolerated.       Hiatal hernia with gastroesophageal reflux    Epigastric pain occuring after eating, ultrasound is negative for gallstones, she will report if worsens      Hyperglycemia    hgba1c acceptable, minimize simple carbs. Increase exercise as tolerated. Continue current meds      Hyperlipidemia, mixed    Tolerating statin, encouraged heart healthy diet, avoid trans fats, minimize simple carbs and saturated fats. Increase exercise as tolerated      Osteopenia    Encouraged to get adequate exercise, calcium and vitamin d intake       Other Visit Diagnoses    Dyspepsia    -  Primary    Relevant Orders    DG Lumbar Spine Complete (Completed)    DG Thoracic Spine 4V (Completed)    US Abdomen Complete (Completed)    MM DIGITAL SCREENING BILATERAL (Completed)    Ambulatory referral to Gastroenterology    Breast cancer screening        Relevant Orders    DG Lumbar Spine Complete (Completed)    DG  Thoracic Spine 4V (Completed)    US Abdomen Complete (Completed)    MM DIGITAL SCREENING BILATERAL (Completed)    Ambulatory referral to Gastroenterology       I am having Ms. Treglia maintain her fish oil-omega-3 fatty acids, MULTIPLE VITAMINS PO, omeprazole, famotidine, furosemide, and rosuvastatin.  No orders of the defined types were placed in this encounter.     Penni Homans, MD

## 2015-09-12 NOTE — Assessment & Plan Note (Signed)
Tolerating statin, encouraged heart healthy diet, avoid trans fats, minimize simple carbs and saturated fats. Increase exercise as tolerated 

## 2015-09-12 NOTE — Assessment & Plan Note (Signed)
Well controlled, no changes to meds. Encouraged heart healthy diet such as the DASH diet and exercise as tolerated.  °

## 2015-09-15 ENCOUNTER — Other Ambulatory Visit: Payer: Self-pay | Admitting: Family Medicine

## 2015-11-12 ENCOUNTER — Ambulatory Visit (INDEPENDENT_AMBULATORY_CARE_PROVIDER_SITE_OTHER): Payer: BLUE CROSS/BLUE SHIELD | Admitting: Gastroenterology

## 2015-11-12 ENCOUNTER — Other Ambulatory Visit (INDEPENDENT_AMBULATORY_CARE_PROVIDER_SITE_OTHER): Payer: BLUE CROSS/BLUE SHIELD

## 2015-11-12 ENCOUNTER — Encounter: Payer: Self-pay | Admitting: Gastroenterology

## 2015-11-12 VITALS — BP 132/70 | HR 64 | Ht 60.75 in | Wt 179.2 lb

## 2015-11-12 DIAGNOSIS — K219 Gastro-esophageal reflux disease without esophagitis: Secondary | ICD-10-CM | POA: Diagnosis not present

## 2015-11-12 DIAGNOSIS — R1310 Dysphagia, unspecified: Secondary | ICD-10-CM

## 2015-11-12 DIAGNOSIS — K589 Irritable bowel syndrome without diarrhea: Secondary | ICD-10-CM

## 2015-11-12 LAB — IGA: IGA: 136 mg/dL (ref 68–378)

## 2015-11-12 MED ORDER — DEXLANSOPRAZOLE 30 MG PO CPDR: 30.0000 mg | DELAYED_RELEASE_CAPSULE | Freq: Every day | ORAL | Status: DC

## 2015-11-12 MED ORDER — RANITIDINE HCL 300 MG PO TABS: 300.0000 mg | ORAL_TABLET | Freq: Every day | ORAL | Status: DC

## 2015-11-12 NOTE — Patient Instructions (Addendum)
You have been scheduled for an esophageal manometry at [facility] on 11/17/2015 at 8:30am. Please arrive 30 minutes prior to your procedure for registration. You will need to go to outpatient registration (1st floor of the hospital) first. Make certain to bring your insurance cards as well as a complete list of medications.  Please remember the following:  1) Nothing to eat or drink after 12:00 midnight on the night before your test.  2) Hold all diabetic medications/insulin the morning of the test. You may eat and take             your medications after the test.  3) For 7  days prior to your test do not take: Dexilant, Prevacid, Nexium, Protonix,         Aciphex, Zegerid, Pantoprazole, Prilosec or omeprazole.  4) For 7  days prior to your test, do not take: Reglan, Tagamet, Zantac, Axid or Pepcid.  5) You MAY use an antacid such as Rolaids or Tums up to 12 hours prior to your test.  It will take at least 2 weeks to receive the results of this test from your physician. ------------------------------------------ ABOUT ESOPHAGEAL MANOMETRY Esophageal manometry (muh-NOM-uh-tree) is a test that gauges how well your esophagus works. Your esophagus is the long, muscular tube that connects your throat to your stomach. Esophageal manometry measures the rhythmic muscle contractions (peristalsis) that occur in your esophagus when you swallow. Esophageal manometry also measures the coordination and force exerted by the muscles of your esophagus.  During esophageal manometry, a thin, flexible tube (catheter) that contains sensors is passed through your nose, down your esophagus and into your stomach. Esophageal manometry can be helpful in diagnosing some mostly uncommon disorders that affect your esophagus.  Why it's done Esophageal manometry is used to evaluate the movement (motility) of food through the esophagus and into the stomach. The test measures how well the circular bands of muscle  (sphincters) at the top and bottom of your esophagus open and close, as well as the pressure, strength and pattern of the wave of esophageal muscle contractions that moves food along.  What you can expect Esophageal manometry is an outpatient procedure done without sedation. Most people tolerate it well. You may be asked to change into a hospital gown before the test starts.  During esophageal manometry  While you are sitting up, a member of your health care team sprays your throat with a numbing medication or puts numbing gel in your nose or both.  A catheter is guided through your nose into your esophagus. The catheter may be sheathed in a water-filled sleeve. It doesn't interfere with your breathing. However, your eyes may water, and you may gag. You may have a slight nosebleed from irritation.  After the catheter is in place, you may be asked to lie on your back on an exam table, or you may be asked to remain seated.  You then swallow small sips of water. As you do, a computer connected to the catheter records the pressure, strength and pattern of your esophageal muscle contractions.  During the test, you'll be asked to breathe slowly and smoothly, remain as still as possible, and swallow only when you're asked to do so.  A member of your health care team may move the catheter down into your stomach while the catheter continues its measurements.  The catheter then is slowly withdrawn. The test usually lasts 20 to 30 minutes.  After esophageal manometry  When your esophageal manometry is  complete, you may return to your normal activities  This test typically takes 30-45 minutes to complete. ________________________________________________________________________________  Go to the basement today for labs Discontinue Omeprazole and Protonix Start Zantac and dexilant, prescriptions sent to your pharmacy Use VSL # 3 112 BU daily, can be purchased over the counter IB Guard twice a day as  needed

## 2015-11-15 ENCOUNTER — Other Ambulatory Visit: Payer: Self-pay

## 2015-11-15 ENCOUNTER — Telehealth: Payer: Self-pay | Admitting: Gastroenterology

## 2015-11-15 LAB — TISSUE TRANSGLUTAMINASE, IGA: Tissue Transglutaminase Ab, IgA: 1 U/mL (ref <4)

## 2015-11-15 MED ORDER — ESOMEPRAZOLE MAGNESIUM 40 MG PO CPDR: 40.0000 mg | DELAYED_RELEASE_CAPSULE | Freq: Every day | ORAL | Status: DC

## 2015-11-15 MED ORDER — VSL#3 PO CAPS
ORAL_CAPSULE | ORAL | Status: DC
Start: 2015-11-15 — End: 2016-09-06

## 2015-11-15 NOTE — Telephone Encounter (Signed)
Patient is advised.  

## 2015-11-15 NOTE — Progress Notes (Signed)
Margaret [name]    [id number]    1950/09/21  Primary Care Physician:BLYTH, Erline Levine, MD  Referring Physician: Mosie Lukes, MD [address] [place], [place] [address]  Chief complaint: GERD, atypical chest pain, bloating, IBS  HPI: 65 year old female with long-standing history of GERD previously followed by Dr. Ardis Hughs is here for follow-up visit. She was last seen in June 2016. She is currently taking Protonix once daily and is getting spasms heartburn and chest pain on daily basis by a late afternoon and is significantly worse by bedtime. She is no longer taking H2 blocker and is reluctant to take PPI twice daily. She sometimes chokes with liquids and pills and feels takes little longer for food to go down at times. Denies any odynophagia, nausea or vomiting. She also complains of bloating and lower abdominal cramping. Also c/o excessive flatus. No diarrhea or constipation.  ast colonoscopy in [place] 11 2010 found a hyperplastic polyp only. Was recommended for repeat colonoscopy at 10 year interval. Last EGD Dr. Ardis Hughs 06/2010 Mild gastritis, biopsies showed no H. Pylori. Small HH noted,She has esophageal duplication cyst; noted by EGD Dr. Ardis Hughs 2012 (submucosal bulge), then underwent EUS Dr. 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    Outpatient Encounter Prescriptions as of 11/12/2015  Medication Sig  . fish oil-omega-3 fatty acids 1000 MG capsule Take 2 g by mouth daily.  . MULTIPLE VITAMINS PO Take by mouth daily.  . rosuvastatin (CRESTOR) 10 MG tablet Take 1 tablet (10 mg total) by mouth daily.  . [DISCONTINUED] omeprazole (PRILOSEC) 20 MG capsule Take 1 capsule (20 mg total) by mouth 2 (two) times daily before a meal.  . [DISCONTINUED] pantoprazole (PROTONIX) 40 MG tablet TAKE 1 TABLET BY MOUTH EVERY MORNING  . Dexlansoprazole (DEXILANT) 30 MG capsule Take 1 capsule (30 mg total) by mouth daily.  . ranitidine  (ZANTAC) 300 MG tablet Take 1 tablet (300 mg total) by mouth at bedtime.  . [DISCONTINUED] famotidine (PEPCID) 40 MG tablet Take 0.5 tablets (20 mg total) by mouth 2 (two) times daily.  . [DISCONTINUED] furosemide (LASIX) 20 MG tablet Take 1 tablet (20 mg total) by mouth daily. Take for edema or weight gain>3# (Patient not taking: Reported on 08/31/2015)   No facility-administered encounter medications on file as of 11/12/2015.    Allergies as of 11/12/2015 - Review Complete 11/12/2015  Allergen Reaction Noted  . No known allergies  01/27/2015    Past Medical History  Diagnosis Date  . Chest pain   . Hyperlipidemia   . Sleep apnea   . GERD (gastroesophageal reflux disease)   . Obesity   . At risk for colon cancer     Last colonoscopy in [place] 03/2009 found hyperplastic polyp only  . Osteopenia 08/31/2012  . Unspecified sinusitis (chronic) 03/30/2013  . Allergic state 03/25/2012  . Hiatal hernia with gastroesophageal reflux 04/27/2010    Qualifier: Diagnosis of  By: Danelle Earthly CMA, Darlene  Failed Omeprazole and Protonix   . Atherosclerosis of aorta ([place]) 04/12/2014  . Aortic calcification ([place]) 08/27/2014  . Peripheral edema 12/13/2014    Past Surgical History  Procedure Laterality Date  . Dilation and curettage of uterus      Family History  Problem Relation Age of Onset  . Stroke Mother   . Lung cancer Father   . Stroke Sister     32  . Thyroid disease Sister   .  Diabetes Sister   . Hypertension Sister   . Hyperlipidemia Sister   . Hypertension Sister   . Sleep apnea Son   . Obesity Son     Social History   Social History  . Marital Status: Married    Spouse Name: N/A  . Number of Children: 2  . Years of Education: N/A   Occupational History  . Accountant    Social History Main Topics  . Smoking status: Never Smoker   . Smokeless tobacco: Never Used  . Alcohol Use: 0.0 oz/week    0 Standard drinks or equivalent per week     Comment: Rarely  . Drug Use: No  .  Sexual Activity: Not on file     Comment: no dietary restrictions, lives with husand, works at [employer]   Other Topics Concern  . Not on file   Social History Narrative      Review of systems: Review of Systems  Constitutional: Negative for fever and chills.  HENT: Negative.   Eyes: Negative for blurred vision.  Respiratory: Negative for cough, shortness of breath and wheezing.   Cardiovascular: Negative for chest pain and palpitations.  Gastrointestinal: as per HPI Genitourinary: Negative for dysuria, urgency, frequency and hematuria.  Musculoskeletal: Negative for myalgias, back pain and joint pain.  Skin: Negative for itching and rash.  Neurological: Negative for dizziness, tremors, focal weakness, seizures and loss of consciousness.  Endo/Heme/Allergies: Negative for environmental allergies.  Psychiatric/Behavioral: Negative for depression, suicidal ideas and hallucinations.  All other systems reviewed and are negative.   Physical Exam: Filed Vitals:   11/12/15 0858  BP: 132/70  Pulse: 64   Gen:      No acute distress HEENT:  EOMI, sclera anicteric Neck:     No masses; no thyromegaly Lungs:    Clear to auscultation bilaterally; normal respiratory effort CV:         Regular rate and rhythm; no murmurs Abd:      + bowel sounds; soft, non-tender; no palpable masses, no distension Ext:    No edema; adequate peripheral perfusion Skin:      Warm and dry; no rash Neuro: alert and oriented x 3 Psych: normal mood and affect  Data Reviewed: Reviewed chart in epic   Assessment and Plan/Recommendations: 65 year old female with long-standing history of GERD here with complaints of daily breakthrough symptoms (heartburn, chest pain, spasms and also intermittent dysphagia) on Protonix once daily Will switch to Dexilant 30mg  daily Start H2 blocker at bedtime as needed Will schedule for esophageal manometry to assess esophageal function and motility and also do 24 hr pH impedance  off PPI IBS with bloating and excessive flatus: start VSL #3 112 b units daily Check TTG IgA Ab to exclude celiac disease IB guard twice daily as needed Return in 3 months  K. Denzil Magnuson , MD [phone] Mon-Fri 8a-5p [phone] after 5p, weekends, holidays  CC: Mosie Lukes, MD

## 2015-11-15 NOTE — Telephone Encounter (Signed)
I have moved her Esoph. Mano to 12/08/15 as requested. Dexilant will cost $97. She would like different PPI. VSL#3 must be sent in as an Rx in order for her to use the coupon. She would like an Rx for this also.

## 2015-11-15 NOTE — Telephone Encounter (Signed)
She can take Nexium 40mg  daily and Rantidine 300mg  at bedtime as needed, will switch Rx. I also requested Robin to send Rx for VSL#3. Thanks

## 2015-11-19 ENCOUNTER — Other Ambulatory Visit: Payer: Self-pay | Admitting: Family Medicine

## 2015-12-15 ENCOUNTER — Encounter (HOSPITAL_COMMUNITY)
Admission: RE | Disposition: A | Discharge status: Home / Self Care | Payer: Self-pay | Source: Ambulatory Visit | Attending: Gastroenterology

## 2015-12-15 ENCOUNTER — Ambulatory Visit (HOSPITAL_COMMUNITY)
Admission: RE | Admit: 2015-12-15 | Discharge: 2015-12-15 | Disposition: A | Discharge status: Home / Self Care | Payer: BLUE CROSS/BLUE SHIELD | Source: Ambulatory Visit | Attending: Gastroenterology | Admitting: Gastroenterology

## 2015-12-15 DIAGNOSIS — K219 Gastro-esophageal reflux disease without esophagitis: Secondary | ICD-10-CM | POA: Diagnosis not present

## 2015-12-15 DIAGNOSIS — K449 Diaphragmatic hernia without obstruction or gangrene: Secondary | ICD-10-CM | POA: Diagnosis not present

## 2015-12-15 DIAGNOSIS — R12 Heartburn: Secondary | ICD-10-CM

## 2015-12-15 HISTORY — PX: 24 HOUR PH STUDY: SHX5419

## 2015-12-15 HISTORY — PX: ESOPHAGEAL MANOMETRY: SHX5429

## 2015-12-15 SURGERY — MANOMETRY, ESOPHAGUS

## 2015-12-15 MED ORDER — LIDOCAINE VISCOUS 2 % MT SOLN
OROMUCOSAL | Status: AC
  Filled 2015-12-15: qty 15

## 2015-12-15 SURGICAL SUPPLY — 2 items
FACESHIELD LNG OPTICON STERILE (SAFETY) IMPLANT
GLOVE BIO SURGEON STRL SZ8 (GLOVE) ×4 IMPLANT

## 2015-12-15 NOTE — Progress Notes (Signed)
Esophageal Manometry done per protocol. Pt tolerated well without complication. 15 PH study with impedance study explained to pt. Monitor and probe explained and teach back done with pt. Pt verbalized understanding. PH probe placed at 34cm. Pt tolerated well. Teach back regarding monitor done with Pt. Pt instructed to return at or after 11am on 12/16/2015 to have probe removed and study downloaded. Report to be sent to Dr. Silverio Decamp.

## 2015-12-16 ENCOUNTER — Encounter (HOSPITAL_COMMUNITY): Payer: Self-pay | Admitting: Gastroenterology

## 2016-01-10 DIAGNOSIS — R12 Heartburn: Secondary | ICD-10-CM

## 2016-01-11 ENCOUNTER — Encounter: Payer: Self-pay | Admitting: Gastroenterology

## 2016-01-11 ENCOUNTER — Ambulatory Visit (INDEPENDENT_AMBULATORY_CARE_PROVIDER_SITE_OTHER): Payer: BLUE CROSS/BLUE SHIELD | Admitting: Gastroenterology

## 2016-01-11 VITALS — BP 132/70 | HR 64 | Ht 60.75 in | Wt 181.0 lb

## 2016-01-11 DIAGNOSIS — M545 Low back pain: Secondary | ICD-10-CM

## 2016-01-11 DIAGNOSIS — K760 Fatty (change of) liver, not elsewhere classified: Secondary | ICD-10-CM

## 2016-01-11 DIAGNOSIS — R1013 Epigastric pain: Secondary | ICD-10-CM

## 2016-01-11 DIAGNOSIS — R12 Heartburn: Secondary | ICD-10-CM | POA: Diagnosis not present

## 2016-01-11 DIAGNOSIS — G8929 Other chronic pain: Secondary | ICD-10-CM

## 2016-01-11 MED ORDER — NORTRIPTYLINE HCL 10 MG PO CAPS: 10.0000 mg | ORAL_CAPSULE | Freq: Every day | ORAL | 3 refills | Status: DC

## 2016-01-11 NOTE — Patient Instructions (Signed)
We have sent in your prescription to your pharmacy ( Nortriptyline) We will refer you to Drusilla Kanner MD for back pain, they will contct you with that appointment

## 2016-01-11 NOTE — Progress Notes (Signed)
Margaret [name]    [id number]    10-03-1950  Primary Care Physician:BLYTH, Erline Levine, MD  Referring Physician: Mosie Lukes, MD [address] [place], [place] [address]  Chief complaint:  Epigastric pain HPI: 65 year old female is here for follow-up visit. Last office visit in June 2017. She is currently taking Nexium once daily and continues to have epigastric pain and spasm . She has only occasional heartburn or chest spasms . Of PPI and H2 blocker for 7 days prior to pH impedance study.  Denies any odynophagia, nausea or vomiting. She also complains of bloating and upper abdominal cramping. Also c/o excessive flatus. No diarrhea or constipation.  She feels epigastric pain is worse when she sits for long duration of time, it feels like a band across her upper abdomen below her ribs going to the back. Pain is usually worse towards the end of the day. She has gained 12 pounds in the past 1 year.      Outpatient Encounter Prescriptions as of 01/11/2016  Medication Sig  . esomeprazole (NEXIUM) 40 MG capsule Take 1 capsule (40 mg total) by mouth daily at 12 noon.  . fish oil-omega-3 fatty acids 1000 MG capsule Take 2 g by mouth daily.  . MULTIPLE VITAMINS PO Take by mouth daily.  Marland Kitchen omeprazole (PRILOSEC) 20 MG capsule TAKE 1 CAPSULE (20 MG TOTAL) BY MOUTH 2 (TWO) TIMES DAILY BEFORE A MEAL.  . Probiotic Product (VSL#3) CAPS 1 capsule a day  . ranitidine (ZANTAC) 300 MG tablet Take 1 tablet (300 mg total) by mouth at bedtime.  . rosuvastatin (CRESTOR) 10 MG tablet Take 1 tablet (10 mg total) by mouth daily.  . nortriptyline (PAMELOR) 10 MG capsule Take 1 capsule (10 mg total) by mouth at bedtime.   No facility-administered encounter medications on file as of 01/11/2016.     Allergies as of 01/11/2016 - Review Complete 01/11/2016  Allergen Reaction Noted  . No known allergies  01/27/2015    Past Medical History:  Diagnosis Date  . Allergic state 03/25/2012    . Aortic calcification ([place]) 08/27/2014  . At risk for colon cancer    Last colonoscopy in [place] 03/2009 found hyperplastic polyp only  . Atherosclerosis of aorta ([place]) 04/12/2014  . Chest pain   . GERD (gastroesophageal reflux disease)   . Hiatal hernia with gastroesophageal reflux 04/27/2010   Qualifier: Diagnosis of  By: Danelle Earthly CMA, Darlene  Failed Omeprazole and Protonix   . Hyperlipidemia   . Obesity   . Osteopenia 08/31/2012  . Peripheral edema 12/13/2014  . Sleep apnea   . Unspecified sinusitis (chronic) 03/30/2013    Past Surgical History:  Procedure Laterality Date  . [place] STUDY N/A 12/15/2015   Procedure: [place] STUDY;  Surgeon: Mauri Pole, MD;  Location: [facility];  Service: Endoscopy;  Laterality: N/A;  . DILATION AND CURETTAGE OF UTERUS    . ESOPHAGEAL MANOMETRY N/A 12/15/2015   Procedure: ESOPHAGEAL MANOMETRY (EM);  Surgeon: Mauri Pole, MD;  Location: [facility];  Service: Endoscopy;  Laterality: N/A;    Family History  Problem Relation Age of Onset  . Stroke Mother   . Lung cancer Father   . Stroke Sister     96  . Thyroid disease Sister   . Diabetes Sister   . Hypertension Sister   . Hyperlipidemia Sister   . Hypertension Sister   . Sleep apnea Son   .  Obesity Son     Social History   Social History  . Marital status: Married    Spouse name: N/A  . Number of children: 2  . Years of education: N/A   Occupational History  . Accountant    Social History Main Topics  . Smoking status: Never Smoker  . Smokeless tobacco: Never Used  . Alcohol use 0.0 oz/week     Comment: Rarely  . Drug use: No  . Sexual activity: Not on file     Comment: no dietary restrictions, lives with husand, works at [employer]   Other Topics Concern  . Not on file   Social History Narrative  . No narrative on file      Review of systems: Review of Systems  Constitutional: Negative for fever and chills.  HENT: Negative.   Eyes: Negative for  blurred vision.  Respiratory: Negative for cough, shortness of breath and wheezing.   Cardiovascular: Negative for chest pain and palpitations.  Gastrointestinal: as per HPI Genitourinary: Negative for dysuria, urgency, frequency and hematuria.  Musculoskeletal: Negative for myalgias, back pain and joint pain.  Skin: Negative for itching and rash.  Neurological: Negative for dizziness, tremors, focal weakness, seizures and loss of consciousness.  Endo/Heme/Allergies: Negative for environmental allergies.  Psychiatric/Behavioral: Negative for depression, suicidal ideas and hallucinations.  All other systems reviewed and are negative.   Physical Exam: Vitals:   01/11/16 1017  BP: 132/70  Pulse: 64   Gen:      No acute distress HEENT:  EOMI, sclera anicteric Neck:     No masses; no thyromegaly Lungs:    Clear to auscultation bilaterally; normal respiratory effort CV:         Regular rate and rhythm; no murmurs Abd:      + bowel sounds; soft, non-tender; no palpable masses, no distension, palpable left lobe of liver and liver edge palpable 3 cm below costal margin Ext:    No edema; adequate peripheral perfusion Skin:      Warm and dry; no rash Neuro: alert and oriented x 3 Psych: normal mood and affect  Data Reviewed:  Colonoscopy in [place] Nov 2010 found a hyperplastic polyp, recall colonoscopy at 10 year interval. EGD and EUS by Dr. Ardis Hughs 06/2010 Mild gastritis, biopsies showed no H. Pylori. 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  Esophageal manometry 11/2015: No significant abnormality 24-hour pH impedance 11/2015: Slightly elevated weakly acid reflux events but otherwise normal DeMesster score and overall reflux events within normal limits. Poor symptom correlation  Assessment and Plan/Recommendations: 65 year old female here with complaints of epigastric pain radiating to the back and likely functional heartburn based on 24-hour pH impedance  She has  evidence of fatty liver based on abdominal ultrasound in April 2017, LFT within normal limits. Discussed in detail diet and exercise to lose 10% body weight in next 3-6 months  CT abdomen and pelvis with contrast in 2015 for similar abdominal pain did not show any significant abnormality Epigastric abdominal pain radiating to the back appears to be mostly musculoskeletal related to poor posture and also weight gain We'll refer to Dr. Hulan Saas for evaluation and management Functional heartburn: Start nortriptyline 10 mg at bedtime Zantac as needed Advised patient to discontinue PPI Due for recall colonoscopy in November 2020 Return as needed  Greater than 50% of the time used for counseling as well as treatment plan and follow-up. She had multiple questions which were answered to her satisfaction  K. Margarette Asal  Silverio Decamp , MD [phone] Mon-Fri 8a-5p [id number] after 5p, weekends, holidays  CC: Mosie Lukes, MD

## 2016-01-13 ENCOUNTER — Other Ambulatory Visit: Payer: Self-pay | Admitting: Family Medicine

## 2016-02-17 ENCOUNTER — Emergency Department (HOSPITAL_BASED_OUTPATIENT_CLINIC_OR_DEPARTMENT_OTHER)
Admission: EM | Admit: 2016-02-17 | Discharge: 2016-02-18 | Disposition: A | Discharge status: Home / Self Care | Payer: BLUE CROSS/BLUE SHIELD | Attending: Emergency Medicine | Admitting: Emergency Medicine

## 2016-02-17 ENCOUNTER — Encounter (HOSPITAL_BASED_OUTPATIENT_CLINIC_OR_DEPARTMENT_OTHER): Payer: Self-pay | Admitting: Emergency Medicine

## 2016-02-17 DIAGNOSIS — R51 Headache: Secondary | ICD-10-CM | POA: Insufficient documentation

## 2016-02-17 DIAGNOSIS — R519 Headache, unspecified: Secondary | ICD-10-CM

## 2016-02-17 DIAGNOSIS — Z79899 Other long term (current) drug therapy: Secondary | ICD-10-CM | POA: Insufficient documentation

## 2016-02-17 DIAGNOSIS — I10 Essential (primary) hypertension: Secondary | ICD-10-CM | POA: Insufficient documentation

## 2016-02-17 MED ORDER — SODIUM CHLORIDE 0.9 % IV BOLUS (SEPSIS)
1000.0000 mL | Freq: Once | INTRAVENOUS | Status: AC
  Administered 2016-02-18: 1000 mL via INTRAVENOUS

## 2016-02-17 MED ORDER — METOCLOPRAMIDE HCL 5 MG/ML IJ SOLN
10.0000 mg | Freq: Once | INTRAMUSCULAR | Status: AC
  Administered 2016-02-18: 10 mg via INTRAVENOUS
  Filled 2016-02-17: qty 2

## 2016-02-17 MED ORDER — DIPHENHYDRAMINE HCL 50 MG/ML IJ SOLN
25.0000 mg | Freq: Once | INTRAMUSCULAR | Status: AC
  Administered 2016-02-18: 25 mg via INTRAVENOUS
  Filled 2016-02-17: qty 1

## 2016-02-17 MED ORDER — ACETAMINOPHEN 325 MG PO TABS
650.0000 mg | ORAL_TABLET | Freq: Once | ORAL | Status: AC
  Administered 2016-02-18: 650 mg via ORAL
  Filled 2016-02-17: qty 2

## 2016-02-17 NOTE — ED Triage Notes (Signed)
Patient states that she is having a headache that radiates to her neck. The patient denies any nausea  - blurred vision

## 2016-02-17 NOTE — ED Provider Notes (Signed)
[place] [facility] Provider Note   CSN: [id number] Arrival date & time: 02/17/16  1951   By signing my name below, I, [name], attest that this documentation has been prepared under the direction and in the presence of non-physician practitioner, Waynetta Pean, PA-C. Electronically Signed: [name], Scribe. 02/17/2016. 10:49 PM.  History   Chief Complaint Chief Complaint  Patient presents with  . Headache    The history is provided by the patient. No language interpreter was used.     HPI Comments:  [name] is a 65 y.o. female who presents to the Emergency Department complaining of a HA since yesterday. She reports diffuse pain throughout her head that radiates to the back of the neck. Pt notes her pain has improved at this time; states her pain was originally a 6/10 but is a 5/10 at this time. Pt has a h/o similar HAs but notes this episode has lasted longer than usual. Pt reports associated mild lightheadedness with position change. No dizziness or room spinning. No alleviating factors noted. She has taken nothing for treatment of her symptoms today.   She denies double vision, photophobia, CP, SOB, numbness, weakness, nausea, vomiting, diarrhea, urinary symptoms, and neck stiffness. She also denies recent fall/head injury.   Past Medical History:  Diagnosis Date  . Allergic state 03/25/2012  . Aortic calcification ([place]) 08/27/2014  . At risk for colon cancer    Last colonoscopy in [place] 03/2009 found hyperplastic polyp only  . Atherosclerosis of aorta ([place]) 04/12/2014  . Chest pain   . GERD (gastroesophageal reflux disease)   . Hiatal hernia with gastroesophageal reflux 04/27/2010   Qualifier: Diagnosis of  By: Danelle Earthly CMA, Darlene  Failed Omeprazole and Protonix   . Hyperlipidemia   . Obesity   . Osteopenia 08/31/2012  . Peripheral edema 12/13/2014  . Sleep apnea   . Unspecified sinusitis (chronic) 03/30/2013    Patient Active Problem List   Diagnosis  Date Noted  . Heartburn   . Peripheral edema 12/13/2014  . Atherosclerosis of aorta ([place]) 04/12/2014  . Rash and nonspecific skin eruption 03/31/2014  . Joint stiffness 03/31/2014  . Dysuria 02/08/2014  . Essential hypertension 02/08/2014  . Atypical chest pain 01/21/2014  . Chest pain 07/27/2013  . Hyperglycemia 07/27/2013  . Preventative health care 03/30/2013  . Left shoulder pain 11/14/2012  . Osteopenia 08/31/2012  . Back pain 03/25/2012  . Knee pain 03/25/2012  . Allergic state 03/25/2012  . HELICOBACTER PYLORI INFECTION 06/08/2010  . Hiatal hernia with gastroesophageal reflux 04/27/2010  . Hyperlipidemia, mixed 04/07/2010  . Sleep apnea 04/07/2010    Past Surgical History:  Procedure Laterality Date  . [place] STUDY N/A 12/15/2015   Procedure: [place] STUDY;  Surgeon: Mauri Pole, MD;  Location: [facility];  Service: Endoscopy;  Laterality: N/A;  . DILATION AND CURETTAGE OF UTERUS    . ESOPHAGEAL MANOMETRY N/A 12/15/2015   Procedure: ESOPHAGEAL MANOMETRY (EM);  Surgeon: Mauri Pole, MD;  Location: [facility];  Service: Endoscopy;  Laterality: N/A;    OB History    No data available       Home Medications    Prior to Admission medications   Medication Sig Start Date End Date Taking? Authorizing Provider  esomeprazole (NEXIUM) 40 MG capsule Take 1 capsule (40 mg total) by mouth daily at 12 noon. 11/15/15   Mauri Pole, MD  fish oil-omega-3 fatty acids 1000 MG capsule Take 2 g by mouth daily.  Historical Provider, MD  MULTIPLE VITAMINS PO Take by mouth daily.    Historical Provider, MD  nortriptyline (PAMELOR) 10 MG capsule Take 1 capsule (10 mg total) by mouth at bedtime. 01/11/16   Mauri Pole, MD  omeprazole (PRILOSEC) 20 MG capsule TAKE 1 CAPSULE (20 MG TOTAL) BY MOUTH 2 (TWO) TIMES DAILY BEFORE A MEAL. 11/19/15   Mosie Lukes, MD  Probiotic Product (VSL#3) CAPS 1 capsule a day 11/15/15   Mauri Pole, MD  ranitidine  (ZANTAC) 300 MG tablet Take 1 tablet (300 mg total) by mouth at bedtime. 11/12/15   Mauri Pole, MD  rosuvastatin (CRESTOR) 10 MG tablet TAKE 1 TABLET BY MOUTH DAILY. 01/13/16   Mosie Lukes, MD    Family History Family History  Problem Relation Age of Onset  . Stroke Mother   . Lung cancer Father   . Stroke Sister     36  . Thyroid disease Sister   . Diabetes Sister   . Hypertension Sister   . Hyperlipidemia Sister   . Hypertension Sister   . Sleep apnea Son   . Obesity Son     Social History Social History  Substance Use Topics  . Smoking status: Never Smoker  . Smokeless tobacco: Never Used  . Alcohol use 0.0 oz/week     Comment: Rarely     Allergies   No known allergies   Review of Systems Review of Systems  Constitutional: Negative for chills and fever.  HENT: Negative for congestion, ear discharge, ear pain and sore throat.   Eyes: Negative for photophobia, pain and visual disturbance.  Respiratory: Negative for cough and shortness of breath.   Cardiovascular: Negative for chest pain.  Gastrointestinal: Negative for abdominal pain, diarrhea, nausea and vomiting.  Genitourinary: Negative for dysuria, frequency, hematuria and urgency.  Musculoskeletal: Negative for back pain, neck pain and neck stiffness.  Skin: Negative for rash.  Neurological: Positive for light-headedness and headaches. Negative for dizziness, syncope, weakness and numbness.     Physical Exam Updated Vital Signs BP (!) 107/44 (BP Location: Left Arm)   Pulse 63   Temp 97.9 F (36.6 C) (Oral)   Resp 18   Ht 5\' 2"  (1.575 m)   Wt 77.1 kg   SpO2 98%   BMI 31.09 kg/m   Physical Exam  Constitutional: She is oriented to person, place, and time. She appears well-developed and well-nourished. No distress.  Nontoxic appearing.  HENT:  Head: Normocephalic and atraumatic.  Right Ear: External ear normal.  Left Ear: External ear normal.  Mouth/Throat: Oropharynx is clear and moist.   Bilateral tympanic membranes are pearly-gray without erythema or loss of landmarks.  No temporal edema or tenderness.   Eyes: Conjunctivae and EOM are normal. Pupils are equal, round, and reactive to light. Right eye exhibits no discharge. Left eye exhibits no discharge.  Neck: Normal range of motion. Neck supple. No JVD present. No tracheal deviation present.  No meningeal signs. No midline neck tenderness.   Cardiovascular: Normal rate, regular rhythm, normal heart sounds and intact distal pulses.  Exam reveals no gallop and no friction rub.   No murmur heard. Pulmonary/Chest: Effort normal and breath sounds normal. No stridor. No respiratory distress. She has no wheezes. She has no rales.  Abdominal: Soft. There is no tenderness.  Musculoskeletal: Normal range of motion. She exhibits no edema.  Lymphadenopathy:    She has no cervical adenopathy.  Neurological: She is alert and oriented to person, place,  and time. No cranial nerve deficit. Coordination normal.  Normal gait Cranial Nerves intact  Speech clear and coherent No pronator drift  Finger to nose intact bilaterally EOMI Vision is grossly intact.  Skin: Skin is warm and dry. Capillary refill takes less than 2 seconds. No rash noted. She is not diaphoretic. No erythema. No pallor.  Psychiatric: She has a normal mood and affect. Her behavior is normal.  Nursing note and vitals reviewed.    ED Treatments / Results  DIAGNOSTIC STUDIES:  Oxygen Saturation is 95% on RA, adequate by my interpretation.    COORDINATION OF CARE:  10:44 PM Discussed treatment plan with pt at bedside and pt agreed to plan.  Labs (all labs ordered are listed, but only abnormal results are displayed) Labs Reviewed - No data to display  EKG  EKG Interpretation None       Radiology No results found.  Procedures Procedures (including critical care time)  Medications Ordered in ED Medications  sodium chloride 0.9 % bolus 1,000 mL (0 mLs  Intravenous Stopped 02/18/16 0050)  metoCLOPramide (REGLAN) injection 10 mg (10 mg Intravenous Given 02/18/16 0007)  diphenhydrAMINE (BENADRYL) injection 25 mg (25 mg Intravenous Given 02/18/16 0009)  acetaminophen (TYLENOL) tablet 650 mg (650 mg Oral Given 02/18/16 0012)     Initial Impression / Assessment and Plan / ED Course  I have reviewed the triage vital signs and the nursing notes.  Pertinent labs & imaging results that were available during my care of the patient were reviewed by me and considered in my medical decision making (see chart for details).  Clinical Course     This is a 65 y.o. female who presents to the Emergency Department complaining of a HA since yesterday. She reports diffuse pain throughout her head that radiates to the back of the neck. Pt notes her pain has improved at this time; states her pain was originally a 6/10 but is a 5/10 at this time. Pt has a h/o similar HAs but notes this episode has lasted longer than usual. Pt reports associated mild lightheadedness with position change. No dizziness or room spinning. No alleviating factors noted. She has taken nothing for treatment of her symptoms today.  Pt HA treated and resolved while in ED.  She reports lightheadedness has resolved. Presentation is like pts typical HA and non concerning for [facility], ICH, Meningitis, or temporal arteritis. Pt is afebrile with no focal neuro deficits, nuchal rigidity, or change in vision. Pt is to follow up with PCP to discuss prophylactic medication. Pt verbalizes understanding and is agreeable with plan to dc. I advised the patient to follow-up with their primary care provider this week. I advised the patient to return to the emergency department with new or worsening symptoms or new concerns. The patient verbalized understanding and agreement with plan.     Final Clinical Impressions(s) / ED Diagnoses   Final diagnoses:  Bad headache    New Prescriptions New Prescriptions   No  medications on file   I personally performed the services described in this documentation, which was scribed in my presence. The recorded information has been reviewed and is accurate.       Waynetta Pean, PA-C 02/18/16 0122    Davonna Belling, MD 02/19/16 [phone]

## 2016-02-17 NOTE — ED Notes (Signed)
Pt c/o ha w pain radiating into neck  States feels an area to rt back of head that feels abnormal  Denies inj  Has not taken anything for pain

## 2016-02-21 ENCOUNTER — Encounter: Payer: Self-pay | Admitting: Medical

## 2016-02-21 ENCOUNTER — Ambulatory Visit (INDEPENDENT_AMBULATORY_CARE_PROVIDER_SITE_OTHER): Payer: BLUE CROSS/BLUE SHIELD | Admitting: Medical

## 2016-02-21 VITALS — BP 128/56 | HR 68 | Temp 98.8°F | Ht 60.75 in | Wt 180.2 lb

## 2016-02-21 DIAGNOSIS — H938X3 Other specified disorders of ear, bilateral: Secondary | ICD-10-CM | POA: Diagnosis not present

## 2016-02-21 DIAGNOSIS — R5383 Other fatigue: Secondary | ICD-10-CM | POA: Diagnosis not present

## 2016-02-21 DIAGNOSIS — R51 Headache: Secondary | ICD-10-CM

## 2016-02-21 DIAGNOSIS — R519 Headache, unspecified: Secondary | ICD-10-CM

## 2016-02-21 LAB — CBC WITH DIFFERENTIAL/PLATELET
BASOS ABS: 0 10*3/uL (ref 0.0–0.1)
BASOS PCT: 0.7 % (ref 0.0–3.0)
EOS ABS: 0.3 10*3/uL (ref 0.0–0.7)
Eosinophils Relative: 4.7 % (ref 0.0–5.0)
HCT: 41.2 % (ref 36.0–46.0)
HEMOGLOBIN: 13.9 g/dL (ref 12.0–15.0)
Lymphocytes Relative: 43.1 % (ref 12.0–46.0)
Lymphs Abs: 2.6 10*3/uL (ref 0.7–4.0)
MCHC: 33.9 g/dL (ref 30.0–36.0)
MCV: 85.5 fl (ref 78.0–100.0)
MONO ABS: 0.5 10*3/uL (ref 0.1–1.0)
Monocytes Relative: 8.6 % (ref 3.0–12.0)
NEUTROS ABS: 2.6 10*3/uL (ref 1.4–7.7)
Neutrophils Relative %: 42.9 % — ABNORMAL LOW (ref 43.0–77.0)
PLATELETS: 241 10*3/uL (ref 150.0–400.0)
RBC: 4.82 Mil/uL (ref 3.87–5.11)
RDW: 14.4 % (ref 11.5–15.5)
WBC: 6 10*3/uL (ref 4.0–10.5)

## 2016-02-21 LAB — COMPREHENSIVE METABOLIC PANEL
ALT: 51 U/L — ABNORMAL HIGH (ref 0–35)
AST: 34 U/L (ref 0–37)
Albumin: 4.3 g/dL (ref 3.5–5.2)
Alkaline Phosphatase: 83 U/L (ref 39–117)
BUN: 12 mg/dL (ref 6–23)
CHLORIDE: 106 meq/L (ref 96–112)
CO2: 31 meq/L (ref 19–32)
CREATININE: 0.68 mg/dL (ref 0.40–1.20)
Calcium: 9.7 mg/dL (ref 8.4–10.5)
GFR: 92.95 mL/min (ref >60.00)
Glucose, Bld: 90 mg/dL (ref 70–99)
Potassium: 4.8 mEq/L (ref 3.5–5.1)
SODIUM: 142 meq/L (ref 135–145)
Total Bilirubin: 0.4 mg/dL (ref 0.2–1.2)
Total Protein: 7.7 g/dL (ref 6.0–8.3)

## 2016-02-21 LAB — LIPID PANEL
CHOL/HDL RATIO: 3
Cholesterol: 168 mg/dL (ref 0–200)
HDL: 51.2 mg/dL (ref >39.00)
LDL CALC: 99 mg/dL (ref 0–99)
NONHDL: 116.84
Triglycerides: 90 mg/dL (ref 0.0–149.0)
VLDL: 18 mg/dL (ref 0.0–40.0)

## 2016-02-21 MED ORDER — FLUTICASONE PROPIONATE 50 MCG/ACT NA SUSP: 2.0000 | Freq: Every day | NASAL | 1 refills | Status: DC

## 2016-02-21 MED ORDER — CYCLOBENZAPRINE HCL 5 MG PO TABS: ORAL_TABLET | ORAL | 0 refills | Status: DC

## 2016-02-21 NOTE — Progress Notes (Signed)
Pre visit review using our clinic tool,if applicable. No additional management support is needed unless otherwise documented below in the visit note.  

## 2016-02-21 NOTE — Progress Notes (Signed)
Subjective:    Patient ID: Margaret [name], female    DOB: 02/11/1951, 65 y.o.   MRN: [id number]  HPI   Pt in for follow up for ha the other day. She went to the ED.   Pt states the other day went to ED on Thursday night  Pt states some ha in the past has had some HA in past. Described very mild self limited type ha in past. She would take tylenol in past and it would stop ha.   In past ha was very rare occasional.  Just recently ha on Thursday was more severe than she had in the past. Some occipital ha. Pt bp other day was 107/44.  Pt did not have any imaging of her head. They gave her iv fluids per pt.  Pt ha did subside that night. But over weekend ha seemed to [place] Saturday night had for brief time. Then went away on Sunday. But on Monday pain came back(level 7/10 ha today). HA occipital and some posterior neck pain. Pt son is getting married in 2 weeks. She is preparing for this and feels stressed.   Pt feels some pressure in hear ears with ha.  Pt denies nasal congestion but does report ear pressure. Some frontal ha. Some watery eye and occasional sneeze.   Some fatigue. Recently her stress.      Review of Systems  Constitutional: Negative for chills, fatigue and fever.  HENT: Negative for dental problem, ear pain, postnasal drip and rhinorrhea.   Respiratory: Negative for cough, chest tightness, shortness of breath and wheezing.   Cardiovascular: Negative for chest pain and palpitations.  Gastrointestinal: Negative for abdominal pain.  Genitourinary: Negative for dyspareunia and dysuria.  Neurological: Positive for headaches. Negative for dizziness, syncope, weakness, light-headedness and numbness.   Past Medical History:  Diagnosis Date  . Allergic state 03/25/2012  . Aortic calcification ([place]) 08/27/2014  . At risk for colon cancer    Last colonoscopy in [place] 03/2009 found hyperplastic polyp only  . Atherosclerosis of aorta ([place]) 04/12/2014  . Chest pain   .  GERD (gastroesophageal reflux disease)   . Hiatal hernia with gastroesophageal reflux 04/27/2010   Qualifier: Diagnosis of  By: Danelle Earthly CMA, Darlene  Failed Omeprazole and Protonix   . Hyperlipidemia   . Obesity   . Osteopenia 08/31/2012  . Peripheral edema 12/13/2014  . Sleep apnea   . Unspecified sinusitis (chronic) 03/30/2013     Social History   Social History  . Marital status: Married    Spouse name: N/A  . Number of children: 2  . Years of education: N/A   Occupational History  . Accountant    Social History Main Topics  . Smoking status: Never Smoker  . Smokeless tobacco: Never Used  . Alcohol use 0.0 oz/week     Comment: Rarely  . Drug use: No  . Sexual activity: Not on file     Comment: no dietary restrictions, lives with husand, works at [employer]   Other Topics Concern  . Not on file   Social History Narrative  . No narrative on file    Past Surgical History:  Procedure Laterality Date  . [place] STUDY N/A 12/15/2015   Procedure: [place] STUDY;  Surgeon: Mauri Pole, MD;  Location: [facility];  Service: Endoscopy;  Laterality: N/A;  . DILATION AND CURETTAGE OF UTERUS    . ESOPHAGEAL MANOMETRY N/A 12/15/2015   Procedure: ESOPHAGEAL MANOMETRY (EM);  Surgeon: Karleen Hampshire  Bary Richard, MD;  Location: [facility];  Service: Endoscopy;  Laterality: N/A;    Family History  Problem Relation Age of Onset  . Stroke Mother   . Lung cancer Father   . Stroke Sister     46  . Thyroid disease Sister   . Diabetes Sister   . Hypertension Sister   . Hyperlipidemia Sister   . Hypertension Sister   . Sleep apnea Son   . Obesity Son     Allergies  Allergen Reactions  . No Known Allergies     Current Outpatient Prescriptions on File Prior to Visit  Medication Sig Dispense Refill  . esomeprazole (NEXIUM) 40 MG capsule Take 1 capsule (40 mg total) by mouth daily at 12 noon. 30 capsule 11  . fish oil-omega-3 fatty acids 1000 MG capsule Take 2 g by mouth daily.      . MULTIPLE VITAMINS PO Take by mouth daily.    . nortriptyline (PAMELOR) 10 MG capsule Take 1 capsule (10 mg total) by mouth at bedtime. 30 capsule 3  . omeprazole (PRILOSEC) 20 MG capsule TAKE 1 CAPSULE (20 MG TOTAL) BY MOUTH 2 (TWO) TIMES DAILY BEFORE A MEAL. 60 capsule 3  . Probiotic Product (VSL#3) CAPS 1 capsule a day 30 capsule 3  . ranitidine (ZANTAC) 300 MG tablet Take 1 tablet (300 mg total) by mouth at bedtime. 30 tablet 3  . rosuvastatin (CRESTOR) 10 MG tablet TAKE 1 TABLET BY MOUTH DAILY. 30 tablet 6   No current facility-administered medications on file prior to visit.     BP (!) 128/56   Pulse 68   Temp 98.8 F (37.1 C) (Oral)   Ht 5' 0.75" (1.543 m)   Wt 180 lb 3.2 oz (81.7 kg)   SpO2 99%   BMI 34.33 kg/m       Objective:   Physical Exam   General Mental Status- Alert. General Appearance- Not in acute distress.   Skin General: Color- Normal Color. Moisture- Normal Moisture.  Neck Carotid Arteries- Normal color. Moisture- Normal Moisture. No carotid bruits. No JVD. Rt trapezius mild tender to palpation. Rt side where inserts into occipital area.   Chest and Lung Exam Auscultation: Breath Sounds:-Normal.  Cardiovascular Auscultation:Rythm- Regular. Murmurs & Other Heart Sounds:Auscultation of the heart reveals- No Murmurs.  Abdomen Inspection:-Inspeection Normal. Palpation/Percussion:Note:No mass. Palpation and Percussion of the abdomen reveal- Non Tender, Non Distended + BS, no rebound or guarding.    Neurologic Cranial Nerve exam:- CN III-XII intact(No nystagmus), symmetric smile. Drift Test:- No drift. Romberg Exam:- Negative.  Heal to Toe Gait exam:-Normal. Finger to Nose:- Normal/Intact Strength:- 5/5 equal and symmetric strength both upper and lower extremities.     Assessment & Plan:  For your headache I offered toradol 60 mg im. You declined but  if ha persist and you change your mind let me know we could give med tomorrow.  For  possible tension had will rx flexeril to use at night.   For ear pressure will rx flonase. Some of your symptoms may represent allergies. Some inflammation of your nares on exam  If any ear pain notify us. In that event would rx antibiotic. On exam now som faint redness central aspect of tm.  For your fatigue will get cbc and cmp today.  For history of elevated cholesterol will get lipid panel since you are fasting.  Follow up in 7 days or as needed.  Since you ha have been more severe in past and different features  will try to get ct of head without contrast.   Sandro Burgo, Percell Miller, PA-C

## 2016-02-21 NOTE — Patient Instructions (Addendum)
For your headache I offered toradol 60 mg im. You declined but if ha persist and you change your mind let me know we could give med tomorrow.  For possible tension had will rx flexeril to use at night.   For ear pressure will rx flonase. Some of your symptoms may represent allergies. Some inflammation of your nares on exam  If any ear pain notify us. In that event would rx antibiotic. On exam now som faint redness central aspect of tm rt side.  For your fatigue will get cbc and cmp today.  For history of elevated cholesterol will get lipid panel since you are fasting.  Follow up in 7 days or as needed.  Since you ha have been more severe in past and different features will try to get ct of head without contrast.

## 2016-02-22 ENCOUNTER — Ambulatory Visit (HOSPITAL_BASED_OUTPATIENT_CLINIC_OR_DEPARTMENT_OTHER): Payer: BLUE CROSS/BLUE SHIELD

## 2016-02-23 ENCOUNTER — Ambulatory Visit (HOSPITAL_BASED_OUTPATIENT_CLINIC_OR_DEPARTMENT_OTHER)
Admission: RE | Admit: 2016-02-23 | Discharge: 2016-02-23 | Disposition: A | Discharge status: Home / Self Care | Payer: BLUE CROSS/BLUE SHIELD | Source: Ambulatory Visit | Attending: Medical | Admitting: Medical

## 2016-02-23 DIAGNOSIS — R51 Headache: Secondary | ICD-10-CM | POA: Diagnosis not present

## 2016-02-23 DIAGNOSIS — R519 Headache, unspecified: Secondary | ICD-10-CM

## 2016-02-28 ENCOUNTER — Ambulatory Visit (INDEPENDENT_AMBULATORY_CARE_PROVIDER_SITE_OTHER): Payer: BLUE CROSS/BLUE SHIELD | Admitting: Family Medicine

## 2016-02-28 VITALS — BP 132/72 | HR 72 | Temp 98.3°F | Wt 182.0 lb

## 2016-02-28 DIAGNOSIS — K449 Diaphragmatic hernia without obstruction or gangrene: Secondary | ICD-10-CM

## 2016-02-28 DIAGNOSIS — K219 Gastro-esophageal reflux disease without esophagitis: Secondary | ICD-10-CM

## 2016-02-28 DIAGNOSIS — I10 Essential (primary) hypertension: Secondary | ICD-10-CM

## 2016-02-28 DIAGNOSIS — E785 Hyperlipidemia, unspecified: Secondary | ICD-10-CM | POA: Diagnosis not present

## 2016-02-28 DIAGNOSIS — G4730 Sleep apnea, unspecified: Secondary | ICD-10-CM

## 2016-02-28 DIAGNOSIS — R739 Hyperglycemia, unspecified: Secondary | ICD-10-CM | POA: Diagnosis not present

## 2016-02-28 DIAGNOSIS — T7840XD Allergy, unspecified, subsequent encounter: Secondary | ICD-10-CM

## 2016-02-28 DIAGNOSIS — M8580 Other specified disorders of bone density and structure, unspecified site: Secondary | ICD-10-CM

## 2016-02-28 NOTE — Assessment & Plan Note (Signed)
Encouraged to get adequate exercise, calcium and vitamin d intake 

## 2016-02-28 NOTE — Assessment & Plan Note (Signed)
minimize simple carbs. Increase exercise as tolerated.  

## 2016-02-28 NOTE — Assessment & Plan Note (Signed)
Avoid offending foods, start probiotics. Do not eat large meals in late evening and consider raising head of bed. Continue Dexilant andRanitidine

## 2016-02-28 NOTE — Assessment & Plan Note (Signed)
Has seen Dr Elsworth Soho in past but has not been seen for awhile and is now having am headaches 5/10, referred back for further evaluation

## 2016-02-28 NOTE — Assessment & Plan Note (Addendum)
She did not tolerate Flonase felt jittery so stopped, may retry. Try nasal saline twice daily

## 2016-02-28 NOTE — Progress Notes (Signed)
Patient ID: Margaret [name], female   DOB: [birth year], 65 y.o.   MRN: [id number]   Subjective:    Patient ID: Margaret [name], female    DOB: [birth year], 65 y.o.   MRN: [id number]  Chief Complaint  Patient presents with  . Follow-up    HPI Patient is in today for follow up . She has had headaches daily and was seen here and in ER, work up and CT scan was negativeShe traces back to having her carpets professionally cleaned and since then the headahces have come. She notes the pain is in neck and back of head. No fall or trauma. She is also under a great deal of stress with her son's upcoming wedding. Is still using her CPAP nightly. No fevers or falls. Has not tried any of the meds she has been prescribed for this. Denies CP/palp/SOB/congestion/fevers/GI or GU c/o. Taking meds as prescribed  Past Medical History:  Diagnosis Date  . Allergic state 03/25/2012  . Aortic calcification ([place]) 08/27/2014  . At risk for colon cancer    Last colonoscopy in [place] 03/2009 found hyperplastic polyp only  . Atherosclerosis of aorta ([place]) 04/12/2014  . Chest pain   . GERD (gastroesophageal reflux disease)   . Hiatal hernia with gastroesophageal reflux 04/27/2010   Qualifier: Diagnosis of  By: Danelle Earthly CMA, Darlene  Failed Omeprazole and Protonix   . Hyperlipidemia   . Obesity   . Osteopenia 08/31/2012  . Peripheral edema 12/13/2014  . Sleep apnea   . Unspecified sinusitis (chronic) 03/30/2013    Past Surgical History:  Procedure Laterality Date  . [place] STUDY N/A 12/15/2015   Procedure: [place] STUDY;  Surgeon: Mauri Pole, MD;  Location: [facility];  Service: Endoscopy;  Laterality: N/A;  . DILATION AND CURETTAGE OF UTERUS    . ESOPHAGEAL MANOMETRY N/A 12/15/2015   Procedure: ESOPHAGEAL MANOMETRY (EM);  Surgeon: Mauri Pole, MD;  Location: [facility];  Service: Endoscopy;  Laterality: N/A;    Family History  Problem Relation Age of Onset  . Stroke Mother   . Lung cancer  Father   . Stroke Sister     25  . Thyroid disease Sister   . Diabetes Sister   . Hypertension Sister   . Hyperlipidemia Sister   . Hypertension Sister   . Sleep apnea Son   . Obesity Son     Social History   Social History  . Marital status: Married    Spouse name: N/A  . Number of children: 2  . Years of education: N/A   Occupational History  . Accountant    Social History Main Topics  . Smoking status: Never Smoker  . Smokeless tobacco: Never Used  . Alcohol use 0.0 oz/week     Comment: Rarely  . Drug use: No  . Sexual activity: Not on file     Comment: no dietary restrictions, lives with husand, works at [employer]   Other Topics Concern  . Not on file   Social History Narrative  . No narrative on file    Outpatient Medications Prior to Visit  Medication Sig Dispense Refill  . cyclobenzaprine (FLEXERIL) 5 MG tablet 1 tab po q ha as needed tension ha or muscle spasms. 10 tablet 0  . esomeprazole (NEXIUM) 40 MG capsule Take 1 capsule (40 mg total) by mouth daily at 12 noon. 30 capsule 11  . fish oil-omega-3 fatty acids 1000 MG capsule Take 2 g by mouth daily.    Marland Kitchen  fluticasone (FLONASE) 50 MCG/ACT nasal spray Place 2 sprays into both nostrils daily. 16 g 1  . MULTIPLE VITAMINS PO Take by mouth daily.    . nortriptyline (PAMELOR) 10 MG capsule Take 1 capsule (10 mg total) by mouth at bedtime. 30 capsule 3  . omeprazole (PRILOSEC) 20 MG capsule TAKE 1 CAPSULE (20 MG TOTAL) BY MOUTH 2 (TWO) TIMES DAILY BEFORE A MEAL. 60 capsule 3  . Probiotic Product (VSL#3) CAPS 1 capsule a day 30 capsule 3  . ranitidine (ZANTAC) 300 MG tablet Take 1 tablet (300 mg total) by mouth at bedtime. 30 tablet 3  . rosuvastatin (CRESTOR) 10 MG tablet TAKE 1 TABLET BY MOUTH DAILY. 30 tablet 6   No facility-administered medications prior to visit.     Allergies  Allergen Reactions  . No Known Allergies     Review of Systems  Constitutional: Negative for fever and malaise/fatigue.  HENT:  Positive for congestion.   Eyes: Negative for blurred vision.  Respiratory: Negative for shortness of breath.   Cardiovascular: Negative for chest pain, palpitations and leg swelling.  Gastrointestinal: Negative for abdominal pain, blood in stool and nausea.  Genitourinary: Negative for dysuria and frequency.  Musculoskeletal: Positive for neck pain. Negative for falls.  Skin: Negative for rash.  Neurological: Positive for headaches. Negative for dizziness and loss of consciousness.  Endo/Heme/Allergies: Negative for environmental allergies.  Psychiatric/Behavioral: Negative for depression. The patient is nervous/anxious.        Objective:    Physical Exam  BP 132/72 (BP Location: Left Arm, Patient Position: Sitting, Cuff Size: Normal)   Pulse 72   Temp 98.3 F (36.8 C) (Oral)   Wt 182 lb (82.6 kg)   SpO2 96%   BMI 34.67 kg/m  Wt Readings from Last 3 Encounters:  02/28/16 182 lb (82.6 kg)  02/21/16 180 lb 3.2 oz (81.7 kg)  02/17/16 170 lb (77.1 kg)     Lab Results  Component Value Date   WBC 6.0 02/21/2016   HGB 13.9 02/21/2016   HCT 41.2 02/21/2016   PLT 241.0 02/21/2016   GLUCOSE 90 02/21/2016   CHOL 168 02/21/2016   TRIG 90.0 02/21/2016   HDL 51.20 02/21/2016   LDLCALC 99 02/21/2016   ALT 51 (H) 02/21/2016   AST 34 02/21/2016   NA 142 02/21/2016   K 4.8 02/21/2016   CL 106 02/21/2016   CREATININE 0.68 02/21/2016   BUN 12 02/21/2016   CO2 31 02/21/2016   TSH 1.04 08/30/2015   HGBA1C 6.2 08/30/2015   MICROALBUR 3.06 (H) 05/16/2011    Lab Results  Component Value Date   TSH 1.04 08/30/2015   Lab Results  Component Value Date   WBC 6.0 02/21/2016   HGB 13.9 02/21/2016   HCT 41.2 02/21/2016   MCV 85.5 02/21/2016   PLT 241.0 02/21/2016   Lab Results  Component Value Date   NA 142 02/21/2016   K 4.8 02/21/2016   CO2 31 02/21/2016   GLUCOSE 90 02/21/2016   BUN 12 02/21/2016   CREATININE 0.68 02/21/2016   BILITOT 0.4 02/21/2016   ALKPHOS 83  02/21/2016   AST 34 02/21/2016   ALT 51 (H) 02/21/2016   PROT 7.7 02/21/2016   ALBUMIN 4.3 02/21/2016   CALCIUM 9.7 02/21/2016   ANIONGAP 12 01/12/2014   GFR 92.95 02/21/2016   Lab Results  Component Value Date   CHOL 168 02/21/2016   Lab Results  Component Value Date   HDL 51.20 02/21/2016   Lab  Results  Component Value Date   LDLCALC 99 02/21/2016   Lab Results  Component Value Date   TRIG 90.0 02/21/2016   Lab Results  Component Value Date   CHOLHDL 3 02/21/2016   Lab Results  Component Value Date   HGBA1C 6.2 08/30/2015       Assessment & Plan:   Problem List Items Addressed This Visit    Hiatal hernia with gastroesophageal reflux    Avoid offending foods, start probiotics. Do not eat large meals in late evening and consider raising head of bed. Continue Dexilant andRanitidine      Sleep apnea    Has seen Dr Elsworth Soho in past but has not been seen for awhile and is now having am headaches 5/10, referred back for further evaluation      Allergic state    She did not tolerate Flonase felt jittery so stopped, may retry. Try nasal saline twice daily      Osteopenia    Encouraged to get adequate exercise, calcium and vitamin d intake      Hyperglycemia     minimize simple carbs. Increase exercise as tolerated.       Essential hypertension    Well controlled, no changes to meds. Encouraged heart healthy diet such as the DASH diet and exercise as tolerated.        Other Visit Diagnoses   None.     I am having Ms. Leitz maintain her fish oil-omega-3 fatty acids, MULTIPLE VITAMINS PO, ranitidine, VSL#3, esomeprazole, omeprazole, nortriptyline, rosuvastatin, cyclobenzaprine, and fluticasone.  No orders of the defined types were placed in this encounter.    Penni Homans, MD

## 2016-02-28 NOTE — Patient Instructions (Addendum)
Encouraged increased hydration, 64 ounces of clear fluids daily. Minimize alcohol and caffeine. Eat small frequent meals with lean proteins and complex carbs. Avoid high and low blood sugars. Get adequate sleep, 7-8 hours a night. Needs exercise daily preferably in the morning. Nasal saline to nose twice daily Consider Salon Pas, Icy Hot or Aspercreme have lidocaine and non sterdoidal patches or cream Have CPAP Vitamin D 2000 IU daily General Headache Without Cause A headache is pain or discomfort felt around the head or neck area. The specific cause of a headache may not be found. There are many causes and types of headaches. A few common ones are:  Tension headaches.  Migraine headaches.  Cluster headaches.  Chronic daily headaches. HOME CARE INSTRUCTIONS  Watch your condition for any changes. Take these steps to help with your condition: Managing Pain  Take over-the-counter and prescription medicines only as told by your health care provider.  Lie down in a dark, quiet room when you have a headache.  If directed, apply ice to the head and neck area:  Put ice in a plastic bag.  Place a towel between your skin and the bag.  Leave the ice on for 20 minutes, 2-3 times per day.  Use a heating pad or hot shower to apply heat to the head and neck area as told by your health care provider.  Keep lights dim if bright lights bother you or make your headaches worse. Eating and Drinking  Eat meals on a regular schedule.  Limit alcohol use.  Decrease the amount of caffeine you drink, or stop drinking caffeine. General Instructions  Keep all follow-up visits as told by your health care provider. This is important.  Keep a headache journal to help find out what may trigger your headaches. For example, write down:  What you eat and drink.  How much sleep you get.  Any change to your diet or medicines.  Try massage or other relaxation techniques.  Limit stress.  Sit up  straight, and do not tense your muscles.  Do not use tobacco products, including cigarettes, chewing tobacco, or e-cigarettes. If you need help quitting, ask your health care provider.  Exercise regularly as told by your health care provider.  Sleep on a regular schedule. Get 7-9 hours of sleep, or the amount recommended by your health care provider. SEEK MEDICAL CARE IF:   Your symptoms are not helped by medicine.  You have a headache that is different from the usual headache.  You have nausea or you vomit.  You have a fever. SEEK IMMEDIATE MEDICAL CARE IF:   Your headache becomes severe.  You have repeated vomiting.  You have a stiff neck.  You have a loss of vision.  You have problems with speech.  You have pain in the eye or ear.  You have muscular weakness or loss of muscle control.  You lose your balance or have trouble walking.  You feel faint or pass out.  You have confusion.   This information is not intended to replace advice given to you by your health care provider. Make sure you discuss any questions you have with your health care provider.   Document Released: 05/15/2005 Document Revised: 02/03/2015 Document Reviewed: 09/07/2014 Elsevier Interactive Patient Education [employer].

## 2016-02-28 NOTE — Assessment & Plan Note (Signed)
Well controlled, no changes to meds. Encouraged heart healthy diet such as the DASH diet and exercise as tolerated.  °

## 2016-02-28 NOTE — Progress Notes (Signed)
Pre visit review using our clinic review tool, if applicable. No additional management support is needed unless otherwise documented below in the visit note. 

## 2016-03-18 ENCOUNTER — Other Ambulatory Visit: Payer: Self-pay | Admitting: Gastroenterology

## 2016-06-01 ENCOUNTER — Ambulatory Visit (INDEPENDENT_AMBULATORY_CARE_PROVIDER_SITE_OTHER): Payer: BLUE CROSS/BLUE SHIELD | Admitting: Behavioral Health

## 2016-06-01 DIAGNOSIS — Z23 Encounter for immunization: Secondary | ICD-10-CM

## 2016-07-09 ENCOUNTER — Other Ambulatory Visit: Payer: Self-pay | Admitting: Gastroenterology

## 2016-07-21 ENCOUNTER — Ambulatory Visit (INDEPENDENT_AMBULATORY_CARE_PROVIDER_SITE_OTHER): Payer: BLUE CROSS/BLUE SHIELD | Admitting: Family Medicine

## 2016-07-21 VITALS — BP 128/68 | HR 59 | Temp 98.0°F | Resp 18 | Wt 174.4 lb

## 2016-07-21 DIAGNOSIS — K219 Gastro-esophageal reflux disease without esophagitis: Secondary | ICD-10-CM | POA: Diagnosis not present

## 2016-07-21 DIAGNOSIS — E782 Mixed hyperlipidemia: Secondary | ICD-10-CM | POA: Diagnosis not present

## 2016-07-21 DIAGNOSIS — K449 Diaphragmatic hernia without obstruction or gangrene: Secondary | ICD-10-CM

## 2016-07-21 DIAGNOSIS — I10 Essential (primary) hypertension: Secondary | ICD-10-CM | POA: Diagnosis not present

## 2016-07-21 DIAGNOSIS — R300 Dysuria: Secondary | ICD-10-CM | POA: Diagnosis not present

## 2016-07-21 DIAGNOSIS — M8580 Other specified disorders of bone density and structure, unspecified site: Secondary | ICD-10-CM

## 2016-07-21 LAB — CBC
HCT: 42.3 % (ref 35.0–45.0)
HEMOGLOBIN: 13.6 g/dL (ref 11.7–15.5)
MCH: 28.3 pg (ref 27.0–33.0)
MCHC: 32.2 g/dL (ref 32.0–36.0)
MCV: 87.9 fL (ref 80.0–100.0)
MPV: 9.9 fL (ref 7.5–12.5)
Platelets: 248 10*3/uL (ref 140–400)
RBC: 4.81 MIL/uL (ref 3.80–5.10)
RDW: 14.3 % (ref 11.0–15.0)
WBC: 6.6 10*3/uL (ref 3.8–10.8)

## 2016-07-21 LAB — COMPREHENSIVE METABOLIC PANEL
ALBUMIN: 4.4 g/dL (ref 3.6–5.1)
ALT: 40 U/L — ABNORMAL HIGH (ref 6–29)
AST: 28 U/L (ref 10–35)
Alkaline Phosphatase: 82 U/L (ref 33–130)
BUN: 13 mg/dL (ref 7–25)
CHLORIDE: 105 mmol/L (ref 98–110)
CO2: 26 mmol/L (ref 20–31)
CREATININE: 0.78 mg/dL (ref 0.50–0.99)
Calcium: 9.6 mg/dL (ref 8.6–10.4)
Glucose, Bld: 95 mg/dL (ref 65–99)
POTASSIUM: 4 mmol/L (ref 3.5–5.3)
SODIUM: 140 mmol/L (ref 135–146)
Total Bilirubin: 0.4 mg/dL (ref 0.2–1.2)
Total Protein: 7.2 g/dL (ref 6.1–8.1)

## 2016-07-21 LAB — LIPID PANEL
CHOL/HDL RATIO: 3.6 ratio (ref <5.0)
CHOLESTEROL: 161 mg/dL (ref <200)
HDL: 45 mg/dL — AB (ref >50)
LDL Cholesterol: 92 mg/dL (ref <100)
TRIGLYCERIDES: 118 mg/dL (ref <150)
VLDL: 24 mg/dL (ref <30)

## 2016-07-21 LAB — TSH: TSH: 2.06 mIU/L

## 2016-07-21 MED ORDER — NITROFURANTOIN MONOHYD MACRO 100 MG PO CAPS: 100.0000 mg | ORAL_CAPSULE | Freq: Two times a day (BID) | ORAL | 0 refills | Status: DC

## 2016-07-21 MED ORDER — OMEPRAZOLE 20 MG PO CPDR: 20.0000 mg | DELAYED_RELEASE_CAPSULE | Freq: Every day | ORAL | 0 refills | Status: DC | PRN

## 2016-07-21 NOTE — Progress Notes (Signed)
Pre visit review using our clinic review tool, if applicable. No additional management support is needed unless otherwise documented below in the visit note. 

## 2016-07-21 NOTE — Assessment & Plan Note (Signed)
Doing better, not taking Nexium uses Omeprazole prn with good results

## 2016-07-21 NOTE — Assessment & Plan Note (Signed)
Well controlled, no changes to meds. Encouraged heart healthy diet such as the DASH diet and exercise as tolerated.  °

## 2016-07-21 NOTE — Patient Instructions (Signed)
Food Choices for Gastroesophageal Reflux Disease, Adult When you have gastroesophageal reflux disease (GERD), the foods you eat and your eating habits are very important. Choosing the right foods can help ease your discomfort. What guidelines do I need to follow?  Choose fruits, vegetables, whole grains, and low-fat dairy products.  Choose low-fat meat, fish, and poultry.  Limit fats such as oils, salad dressings, butter, nuts, and avocado.  Keep a food diary. This helps you identify foods that cause symptoms.  Avoid foods that cause symptoms. These may be different for everyone.  Eat small meals often instead of 3 large meals a day.  Eat your meals slowly, in a place where you are relaxed.  Limit fried foods.  Cook foods using methods other than frying.  Avoid drinking alcohol.  Avoid drinking large amounts of liquids with your meals.  Avoid bending over or lying down until 2-3 hours after eating. What foods are not recommended? These are some foods and drinks that may make your symptoms worse: Vegetables  Tomatoes. Tomato juice. Tomato and spaghetti sauce. Chili peppers. Onion and garlic. Horseradish. Fruits  Oranges, grapefruit, and lemon (fruit and juice). Meats  High-fat meats, fish, and poultry. This includes hot dogs, ribs, ham, sausage, salami, and bacon. Dairy  Whole milk and chocolate milk. Sour cream. Cream. Butter. Ice cream. Cream cheese. Drinks  Coffee and tea. Bubbly (carbonated) drinks or energy drinks. Condiments  Hot sauce. Barbecue sauce. Sweets/Desserts  Chocolate and cocoa. Donuts. Peppermint and spearmint. Fats and Oils  High-fat foods. This includes French fries and potato chips. Other  Vinegar. Strong spices. This includes black pepper, white pepper, red pepper, cayenne, curry powder, cloves, ginger, and chili powder. The items listed above may not be a complete list of foods and drinks to avoid. Contact your dietitian for more information.    This information is not intended to replace advice given to you by your health care provider. Make sure you discuss any questions you have with your health care provider. Document Released: 11/14/2011 Document Revised: 10/21/2015 Document Reviewed: 03/19/2013 Elsevier Interactive Patient Education  2017 Elsevier Inc.  

## 2016-07-21 NOTE — Progress Notes (Signed)
Patient ID: Margaret [name], female   DOB: [birth year], 66 y.o.   MRN: [id number]   Subjective:  I acted as a Education administrator for Dr. Charlett Blake. [place], [place]   Patient ID: Margaret [name], female    DOB: April 24, 1951, 66 y.o.   MRN: [id number]  Chief Complaint  Patient presents with  . Follow-up  . Hypertension  . Hyperlipidemia    Hypertension  This is a recurrent problem. The problem has been gradually improving since onset. Pertinent negatives include no blurred vision, chest pain, headaches, malaise/fatigue, palpitations or shortness of breath.  Hyperlipidemia  This is a recurrent problem. The problem is controlled. Pertinent negatives include no chest pain or shortness of breath.    Is noting some mild dysuria today but no significant abdominal pain, nausea or urinary urgency. No hematuria or fevers, chills. Denies CP/palp/SOB/HA/congestion/fevers/GI or c/o. Taking meds as prescribed  Patient is in today for 4 month follow up for hypertension Past Medical History:  Diagnosis Date  . Allergic state 03/25/2012  . Aortic calcification ([place]) 08/27/2014  . At risk for colon cancer    Last colonoscopy in [place] 03/2009 found hyperplastic polyp only  . Atherosclerosis of aorta ([place]) 04/12/2014  . Chest pain   . GERD (gastroesophageal reflux disease)   . Hiatal hernia with gastroesophageal reflux 04/27/2010   Qualifier: Diagnosis of  By: Danelle Earthly CMA, Darlene  Failed Omeprazole and Protonix   . Hyperlipidemia   . Obesity   . Osteopenia 08/31/2012  . Peripheral edema 12/13/2014  . Sleep apnea   . Unspecified sinusitis (chronic) 03/30/2013    Past Surgical History:  Procedure Laterality Date  . [place] STUDY N/A 12/15/2015   Procedure: [place] STUDY;  Surgeon: Mauri Pole, MD;  Location: [facility];  Service: Endoscopy;  Laterality: N/A;  . DILATION AND CURETTAGE OF UTERUS    . ESOPHAGEAL MANOMETRY N/A 12/15/2015   Procedure: ESOPHAGEAL MANOMETRY (EM);  Surgeon: Mauri Pole, MD;   Location: [facility];  Service: Endoscopy;  Laterality: N/A;    Family History  Problem Relation Age of Onset  . Stroke Mother   . Lung cancer Father   . Stroke Sister     25  . Thyroid disease Sister   . Diabetes Sister   . Hypertension Sister   . Hyperlipidemia Sister   . Hypertension Sister   . Sleep apnea Son   . Obesity Son     Social History   Social History  . Marital status: Married    Spouse name: N/A  . Number of children: 2  . Years of education: N/A   Occupational History  . Accountant    Social History Main Topics  . Smoking status: Never Smoker  . Smokeless tobacco: Never Used  . Alcohol use 0.0 oz/week     Comment: Rarely  . Drug use: No  . Sexual activity: Not on file     Comment: no dietary restrictions, lives with husand, works at [employer]   Other Topics Concern  . Not on file   Social History Narrative  . No narrative on file    Outpatient Medications Prior to Visit  Medication Sig Dispense Refill  . cyclobenzaprine (FLEXERIL) 5 MG tablet 1 tab po q ha as needed tension ha or muscle spasms. 10 tablet 0  . fish oil-omega-3 fatty acids 1000 MG capsule Take 2 g by mouth daily.    . fluticasone (FLONASE) 50 MCG/ACT nasal spray Place 2 sprays into both nostrils daily. [place]  g 1  . MULTIPLE VITAMINS PO Take by mouth daily.    . nortriptyline (PAMELOR) 10 MG capsule Take 1 capsule (10 mg total) by mouth at bedtime. 30 capsule 3  . Probiotic Product (VSL#3) CAPS 1 capsule a day 30 capsule 3  . ranitidine (ZANTAC) 300 MG tablet TAKE 1 TABLET BY MOUTH AT BEDTIME 30 tablet 3  . rosuvastatin (CRESTOR) 10 MG tablet TAKE 1 TABLET BY MOUTH DAILY. 30 tablet 6  . esomeprazole (NEXIUM) 40 MG capsule Take 1 capsule (40 mg total) by mouth daily at 12 noon. 30 capsule 11  . omeprazole (PRILOSEC) 20 MG capsule TAKE 1 CAPSULE (20 MG TOTAL) BY MOUTH 2 (TWO) TIMES DAILY BEFORE A MEAL. 60 capsule 3   No facility-administered medications prior to visit.     Allergies    Allergen Reactions  . No Known Allergies     Review of Systems  Constitutional: Negative for fever and malaise/fatigue.  HENT: Negative for congestion.   Eyes: Negative for blurred vision.  Respiratory: Negative for cough and shortness of breath.   Cardiovascular: Negative for chest pain, palpitations and leg swelling.  Gastrointestinal: Negative for abdominal pain, constipation, diarrhea and vomiting.  Genitourinary: Positive for dysuria. Negative for frequency, hematuria and urgency.  Musculoskeletal: Negative for back pain.  Skin: Negative for rash.  Neurological: Negative for loss of consciousness and headaches.       Objective:    Physical Exam  Constitutional: She is oriented to person, place, and time. She appears well-developed and well-nourished. No distress.  HENT:  Head: Normocephalic and atraumatic.  Eyes: Conjunctivae are normal.  Neck: Normal range of motion. No thyromegaly present.  Cardiovascular: Normal rate and regular rhythm.   Pulmonary/Chest: Effort normal and breath sounds normal. She has no wheezes.  Abdominal: Soft. Bowel sounds are normal. There is no tenderness.  Musculoskeletal: Normal range of motion. She exhibits no edema or deformity.  Neurological: She is alert and oriented to person, place, and time.  Skin: Skin is warm and dry. She is not diaphoretic.  Psychiatric: She has a normal mood and affect.    BP 128/68 (BP Location: Left Arm, Patient Position: Sitting, Cuff Size: Normal)   Pulse (!) 59   Temp 98 F (36.7 C) (Oral)   Resp 18   Wt 174 lb 6.4 oz (79.1 kg)   SpO2 98%   BMI 33.22 kg/m  Wt Readings from Last 3 Encounters:  07/21/16 174 lb 6.4 oz (79.1 kg)  02/28/16 182 lb (82.6 kg)  02/21/16 180 lb 3.2 oz (81.7 kg)     Lab Results  Component Value Date   WBC 6.6 07/21/2016   HGB 13.6 07/21/2016   HCT 42.3 07/21/2016   PLT 248 07/21/2016   GLUCOSE 95 07/21/2016   CHOL 161 07/21/2016   TRIG 118 07/21/2016   HDL 45 (L)  07/21/2016   LDLCALC 92 07/21/2016   ALT 40 (H) 07/21/2016   AST 28 07/21/2016   NA 140 07/21/2016   K 4.0 07/21/2016   CL 105 07/21/2016   CREATININE 0.78 07/21/2016   BUN 13 07/21/2016   CO2 26 07/21/2016   TSH 2.06 07/21/2016   HGBA1C 6.2 08/30/2015   MICROALBUR 3.06 (H) 05/16/2011    Lab Results  Component Value Date   TSH 2.06 07/21/2016   Lab Results  Component Value Date   WBC 6.6 07/21/2016   HGB 13.6 07/21/2016   HCT 42.3 07/21/2016   MCV 87.9 07/21/2016   PLT 248  07/21/2016   Lab Results  Component Value Date   NA 140 07/21/2016   K 4.0 07/21/2016   CO2 26 07/21/2016   GLUCOSE 95 07/21/2016   BUN 13 07/21/2016   CREATININE 0.78 07/21/2016   BILITOT 0.4 07/21/2016   ALKPHOS 82 07/21/2016   AST 28 07/21/2016   ALT 40 (H) 07/21/2016   PROT 7.2 07/21/2016   ALBUMIN 4.4 07/21/2016   CALCIUM 9.6 07/21/2016   ANIONGAP 12 01/12/2014   GFR 92.95 02/21/2016   Lab Results  Component Value Date   CHOL 161 07/21/2016   Lab Results  Component Value Date   HDL 45 (L) 07/21/2016   Lab Results  Component Value Date   LDLCALC 92 07/21/2016   Lab Results  Component Value Date   TRIG 118 07/21/2016   Lab Results  Component Value Date   CHOLHDL 3.6 07/21/2016   Lab Results  Component Value Date   HGBA1C 6.2 08/30/2015       Assessment & Plan:   Problem List Items Addressed This Visit    Hyperlipidemia, mixed    Tolerating statin, encouraged heart healthy diet, avoid trans fats, minimize simple carbs and saturated fats. Increase exercise as tolerated      Relevant Orders   Lipid panel (Completed)   Hiatal hernia with gastroesophageal reflux    Doing better, not taking Nexium uses Omeprazole prn with good results      Relevant Medications   omeprazole (PRILOSEC) 20 MG capsule   Osteopenia    Encouraged to get adequate exercise, calcium and vitamin d intake      Dysuria - Primary    Check urine culture      Relevant Orders   Urine  culture (Completed)   Urinalysis (Completed)   Essential hypertension    Well controlled, no changes to meds. Encouraged heart healthy diet such as the DASH diet and exercise as tolerated.       Relevant Orders   CBC (Completed)   Comprehensive metabolic panel (Completed)   TSH (Completed)      I have discontinued Ms. Quihuis esomeprazole. I have also changed her omeprazole. Additionally, I am having her start on nitrofurantoin (macrocrystal-monohydrate). Lastly, I am having her maintain her fish oil-omega-3 fatty acids, MULTIPLE VITAMINS PO, VSL#3, nortriptyline, rosuvastatin, cyclobenzaprine, fluticasone, and ranitidine.  Meds ordered this encounter  Medications  . omeprazole (PRILOSEC) 20 MG capsule    Sig: Take 1 capsule (20 mg total) by mouth daily as needed.    Dispense:  60 capsule    Refill:  0  . nitrofurantoin, macrocrystal-monohydrate, (MACROBID) 100 MG capsule    Sig: Take 1 capsule (100 mg total) by mouth 2 (two) times daily.    Dispense:  10 capsule    Refill:  0    CMA served as scribe during this visit. History, Physical and Plan performed by medical provider. Documentation and orders reviewed and attested to.  Penni Homans, MD

## 2016-07-21 NOTE — Assessment & Plan Note (Signed)
Tolerating statin, encouraged heart healthy diet, avoid trans fats, minimize simple carbs and saturated fats. Increase exercise as tolerated 

## 2016-07-21 NOTE — Assessment & Plan Note (Signed)
Encouraged to get adequate exercise, calcium and vitamin d intake 

## 2016-07-23 LAB — URINE CULTURE: ORGANISM ID, BACTERIA: NO GROWTH

## 2016-07-24 LAB — URINALYSIS

## 2016-07-30 NOTE — Assessment & Plan Note (Signed)
Check urine culture  

## 2016-08-14 ENCOUNTER — Other Ambulatory Visit: Payer: Self-pay | Admitting: Family Medicine

## 2016-08-15 ENCOUNTER — Other Ambulatory Visit: Payer: Self-pay | Admitting: Family Medicine

## 2016-08-15 ENCOUNTER — Telehealth: Payer: Self-pay | Admitting: Family Medicine

## 2016-08-15 DIAGNOSIS — M5442 Lumbago with sciatica, left side: Principal | ICD-10-CM

## 2016-08-15 DIAGNOSIS — G8929 Other chronic pain: Secondary | ICD-10-CM

## 2016-08-15 NOTE — Telephone Encounter (Signed)
Specifics of back pain has been going on one year.  The pain is low back and on left side. She has difficulty getting out of bed in the mornings.  Sitting along time bothers her.  Light exercise and walking helps.  The pain comes and goes.  She did mention to PCP and you recommended to her.   So does want referral here in our building---she went there before.  Pain if more often now than then. She is now home and can do therapy.

## 2016-08-15 NOTE — Telephone Encounter (Signed)
Pt called in to request a referral to sports medicine. She says that she has seen provider for back pain.

## 2016-08-15 NOTE — Telephone Encounter (Signed)
Telephone number to call is [phone]

## 2016-08-17 ENCOUNTER — Encounter: Payer: Self-pay | Admitting: Family Medicine

## 2016-08-17 ENCOUNTER — Ambulatory Visit (INDEPENDENT_AMBULATORY_CARE_PROVIDER_SITE_OTHER): Payer: BLUE CROSS/BLUE SHIELD | Admitting: Family Medicine

## 2016-08-17 VITALS — BP 143/80 | HR 81 | Ht 62.0 in | Wt 170.0 lb

## 2016-08-17 DIAGNOSIS — S39012A Strain of muscle, fascia and tendon of lower back, initial encounter: Secondary | ICD-10-CM | POA: Diagnosis not present

## 2016-08-17 MED ORDER — MELOXICAM 15 MG PO TABS: 15.0000 mg | ORAL_TABLET | Freq: Every day | ORAL | 2 refills | Status: DC

## 2016-08-17 NOTE — Patient Instructions (Signed)
You have a lumbar strain and mild SI joint inflammation. Take tylenol for baseline pain relief (1-2 extra strength tabs 3x/day) Meloxicam 15mg  daily with food for pain and inflammation - take 7-10 days then as needed. Physical therapy has been shown to be helpful as well - start this. Strengthening of low back muscles, abdominal musculature are key for long term pain relief. Follow up with me in 6 weeks for reevaluation.

## 2016-08-18 NOTE — Assessment & Plan Note (Signed)
2/2 lumbar strain and to lesser extent sacroiliitis.  She will start physical therapy and home exercise program.  Start meloxicam.  Can take tylenol if needed.  F/u in 6 weeks.

## 2016-08-18 NOTE — Progress Notes (Addendum)
PCP and consultation requested by: Penni Homans, MD  Subjective:   HPI: Patient is a 66 y.o. female here for low back pain.  Patient reports she's had problems with low back for the past couple months now. Remotely had similar problem which responded well to physical therapy. Pain is 5/10, sharp, radiates to left side of buttocks. Worse with prolonged standing and sitting. Worse in morning. Feels like back locks up at times and she cannot move. Tried salon pas, heating pad, ibuprofen. No bowel/bladder dysfunction. No skin changes, numbness.  Past Medical History:  Diagnosis Date  . Allergic state 03/25/2012  . Aortic calcification ([place]) 08/27/2014  . At risk for colon cancer    Last colonoscopy in [place] 03/2009 found hyperplastic polyp only  . Atherosclerosis of aorta ([place]) 04/12/2014  . Chest pain   . GERD (gastroesophageal reflux disease)   . Hiatal hernia with gastroesophageal reflux 04/27/2010   Qualifier: Diagnosis of  By: Danelle Earthly CMA, Darlene  Failed Omeprazole and Protonix   . Hyperlipidemia   . Obesity   . Osteopenia 08/31/2012  . Peripheral edema 12/13/2014  . Sleep apnea   . Unspecified sinusitis (chronic) 03/30/2013    Current Outpatient Prescriptions on File Prior to Visit  Medication Sig Dispense Refill  . fish oil-omega-3 fatty acids 1000 MG capsule Take 2 g by mouth daily.    . fluticasone (FLONASE) 50 MCG/ACT nasal spray Place 2 sprays into both nostrils daily. 16 g 1  . MULTIPLE VITAMINS PO Take by mouth daily.    . nortriptyline (PAMELOR) 10 MG capsule Take 1 capsule (10 mg total) by mouth at bedtime. 30 capsule 3  . omeprazole (PRILOSEC) 20 MG capsule Take 1 capsule (20 mg total) by mouth daily as needed. 60 capsule 0  . Probiotic Product (VSL#3) CAPS 1 capsule a day 30 capsule 3  . ranitidine (ZANTAC) 300 MG tablet TAKE 1 TABLET BY MOUTH AT BEDTIME 30 tablet 3  . rosuvastatin (CRESTOR) 10 MG tablet TAKE 1 TABLET BY MOUTH DAILY. 30 tablet 6   No current  facility-administered medications on file prior to visit.     Past Surgical History:  Procedure Laterality Date  . [place] STUDY N/A 12/15/2015   Procedure: [place] STUDY;  Surgeon: Mauri Pole, MD;  [facility]: [facility];  Service: Endoscopy;  Laterality: N/A;  . DILATION AND CURETTAGE OF UTERUS    . ESOPHAGEAL MANOMETRY N/A 12/15/2015   Procedure: ESOPHAGEAL MANOMETRY (EM);  Surgeon: Mauri Pole, MD;  Location: [facility];  Service: Endoscopy;  Laterality: N/A;    Allergies  Allergen Reactions  . No Known Allergies     Social History   Social History  . Marital status: Married    Spouse name: N/A  . Number of children: 2  . Years of education: N/A   Occupational History  . Accountant    Social History Main Topics  . Smoking status: Never Smoker  . Smokeless tobacco: Never Used  . Alcohol use 0.0 oz/week     Comment: Rarely  . Drug use: No  . Sexual activity: Not on file     Comment: no dietary restrictions, lives with husand, works at [employer]   Other Topics Concern  . Not on file   Social History Narrative  . No narrative on file    Family History  Problem Relation Age of Onset  . Stroke Mother   . Lung cancer Father   . Stroke Sister  63  . Thyroid disease Sister   . Diabetes Sister   . Hypertension Sister   . Hyperlipidemia Sister   . Hypertension Sister   . Sleep apnea Son   . Obesity Son     BP (!) 143/80   Pulse 81   Ht 5\' 2"  (1.575 m)   Wt 170 lb (77.1 kg)   BMI 31.09 kg/m   Review of Systems: See HPI above.     Objective:  Physical Exam:  Gen: NAD, comfortable in exam room  Back: No gross deformity, scoliosis. TTP mildly left paraspinal lumbar region.  No midline or bony TTP. FROM with pain on extension > flexion. Strength LEs 5/5 all muscle groups.   2+ MSRs in patellar and right achilles tendons, trace left achilles. Negative SLRs. Sensation intact to light touch bilaterally. Negative logroll bilateral  hips Mild positive left fabers, negative right.  Negative piriformis stretches.   Assessment & Plan:  1. Low back pain - 2/2 lumbar strain and to lesser extent sacroiliitis.  She will start physical therapy and home exercise program.  Start meloxicam.  Can take tylenol if needed.  F/u in 6 weeks.

## 2016-08-28 NOTE — Telephone Encounter (Signed)
Referral done

## 2016-08-29 ENCOUNTER — Ambulatory Visit: Payer: BLUE CROSS/BLUE SHIELD | Attending: Family Medicine | Admitting: Physical Therapy

## 2016-08-29 DIAGNOSIS — M545 Low back pain: Secondary | ICD-10-CM | POA: Diagnosis not present

## 2016-08-29 DIAGNOSIS — R293 Abnormal posture: Secondary | ICD-10-CM

## 2016-08-29 DIAGNOSIS — M5412 Radiculopathy, cervical region: Secondary | ICD-10-CM | POA: Insufficient documentation

## 2016-08-29 DIAGNOSIS — M542 Cervicalgia: Secondary | ICD-10-CM | POA: Diagnosis present

## 2016-08-29 DIAGNOSIS — M6281 Muscle weakness (generalized): Secondary | ICD-10-CM | POA: Diagnosis present

## 2016-08-29 NOTE — Therapy (Signed)
[facility] [address]  [place], [place], [address] Phone: [phone]   Fax:  [phone]  Physical Therapy Evaluation  Patient Details  Name: Margaret [name] MRN: [id number] Date of Birth: Oct 12, 1950 Referring Provider: Dr. Karlton Lemon  Encounter Date: 08/29/2016      PT End of Session - 08/29/16 0851    Visit Number 1   Number of Visits 16   Date for PT Re-Evaluation 10/24/16   PT Start Time 0849   PT Stop Time 0928   PT Time Calculation (min) 39 min   Activity Tolerance Patient tolerated treatment well   Behavior During Therapy [facility] for tasks assessed/performed      Past Medical History:  Diagnosis Date  . Allergic state 03/25/2012  . Aortic calcification ([place]) 08/27/2014  . At risk for colon cancer    Last colonoscopy in [place] 03/2009 found hyperplastic polyp only  . Atherosclerosis of aorta ([place]) 04/12/2014  . Chest pain   . GERD (gastroesophageal reflux disease)   . Hiatal hernia with gastroesophageal reflux 04/27/2010   Qualifier: Diagnosis of  By: Danelle Earthly CMA, Darlene  Failed Omeprazole and Protonix   . Hyperlipidemia   . Obesity   . Osteopenia 08/31/2012  . Peripheral edema 12/13/2014  . Sleep apnea   . Unspecified sinusitis (chronic) 03/30/2013    Past Surgical History:  Procedure Laterality Date  . [place] STUDY N/A 12/15/2015   Procedure: [place] STUDY;  Surgeon: Mauri Pole, MD;  [facility];  Service: Endoscopy;  Laterality: N/A;  . DILATION AND CURETTAGE OF UTERUS    . ESOPHAGEAL MANOMETRY N/A 12/15/2015   Procedure: ESOPHAGEAL MANOMETRY (EM);  Surgeon: Mauri Pole, MD;  [facility];  Service: Endoscopy;  Laterality: N/A;    There were no vitals filed for this visit.       Subjective Assessment - 08/29/16 0850    Subjective Patient reporting low back pain and stiffness - started "quite a while ago." Does have in increase in pain/pressure with  prolonged standing and walking. Transfers can be difficult, as well as stair climbing. Has tried OTC pain medication with some relief. Does not take prescription meds due to feeling like it was too strong for her. No known mechanism of injury.    Limitations Standing;Walking   How long can you stand comfortably? 30 minutes   How long can you walk comfortably? < 1 mile   Diagnostic tests Xray - no diagnostic findings   Patient Stated Goals Improve pain, function   Currently in Pain? Yes   Pain Score 5    Pain Location Back   Pain Orientation Lower   Pain Descriptors / Indicators Pressure;Constant   Pain Type Acute pain   Pain Onset More than a month ago   Pain Frequency Intermittent   Aggravating Factors  prolonged walking, standing   Pain Relieving Factors massage, heat, Tylenol            [facility] PT Assessment - 08/29/16 0858      Assessment   Medical Diagnosis Strain of lumbar region   Referring Provider Dr. Karlton Lemon   Onset Date/Surgical Date --  few months ago   Next MD Visit --  6 weeks   Prior Therapy yes - 3 years ago     Precautions   Precautions None     Restrictions   Weight Bearing Restrictions No     Balance Screen   Has  the patient fallen in the past 6 months No   Has the patient had a decrease in activity level because of a fear of falling?  No   Is the patient reluctant to leave their home because of a fear of falling?  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[employer], [employer] consultant   Overall Cognitive Status Within Functional Limits for tasks assessed     Observation/Other Assessments   Focus on Therapeutic Outcomes (FOTO)  Lumbar  Spine: 69 (31% limited, predicted 25% limited)     Sensation   Light Touch Appears Intact     Coordination   Gross Motor Movements are Fluid and Coordinated Yes     Posture/Postural Control   Posture/Postural Control Postural limitations   Postural Limitations Rounded Shoulders;Forward head     ROM / Strength   AROM / PROM / Strength AROM;Strength     AROM   AROM Assessment Site Lumbar   Lumbar Flexion fingertip to above joint line - painful   Lumbar Extension 50% limited - pressure   Lumbar - Right Side Bend fingertip to above joint line - pressure    Lumbar - Left Side Bend fingertip to above joint line - pressure   Lumbar - Right Rotation WFL - pressure   Lumbar - Left Rotation WFL - pressure     Strength   Strength Assessment Site Hip;Knee;Ankle   Right/Left Hip Right;Left   Right Hip Flexion 4-/5   Left Hip Flexion 4-/5   Right/Left Knee Right;Left   Right Knee Flexion 4-/5   Right Knee Extension 4/5   Left Knee Flexion 4-/5   Left Knee Extension 4/5   Right/Left Ankle Right;Left   Right Ankle Dorsiflexion 4-/5   Left Ankle Dorsiflexion 3+/5  reports prior ankle injury limiting strength     Flexibility   Soft Tissue Assessment /Muscle Length yes   Hamstrings B tightness   Quadriceps B tightness     Palpation   Spinal mobility WFL   Palpation comment some tenderness to palpation along thoracic and lumbar paraspinals     Special Tests    Special Tests Lumbar;Sacrolliac Tests   Lumbar Tests Straight Leg Raise   Sacroiliac Tests  --     Straight Leg Raise   Findings Negative   Comment bilateral     Pelvic Dictraction   Findings Negative     Pelvic Compression   Findings Negative     Sacral thrust    Findings Negative     Transfers   Comments taught patient log rolling technique to reduce strain on low back                   [facility] Adult PT Treatment/Exercise - 08/29/16 0858      Exercises   Exercises Lumbar     Lumbar Exercises:  Stretches   Passive Hamstring Stretch 3 reps;30 seconds   Passive Hamstring Stretch Limitations supine with strap - bilateral   Single Knee to Chest Stretch 5 reps;10 seconds   Single Knee to Chest Stretch Limitations bilateral   Lower Trunk Rotation 5  reps;10 seconds   Lower Trunk Rotation Limitations bilateral     Lumbar Exercises: Supine   Bridge 10 reps                PT Education - 08/29/16 [phone]    Education provided Yes   Education Details exam findings, POC, HEP   Person(s) Educated Patient   Methods Explanation;Demonstration;Handout   Comprehension Verbalized understanding;Returned demonstration          PT Short Term Goals - 08/29/16 1137      PT SHORT TERM GOAL #1   Title Patient to be independent with HEP (09/26/16)   Status New     PT SHORT TERM GOAL #2   Title Patient to demonstrate good posture and body mechanics to reduced stressors placed on low back musculature for reduced pain (09/26/16)   Status New           PT Long Term Goals - 08/29/16 1138      PT LONG TERM GOAL #1   Title Patient to be independent with advanced HEP (10/24/16)   Status New     PT LONG TERM GOAL #2   Title Patient to report ability to ambulate for >/= 1 mile with no increase in back pain (10/24/16)   Status New     PT LONG TERM GOAL #3   Title Patient to report ability to perform transfers from various positions with no increase in pain (10/24/16)   Status New     PT LONG TERM GOAL #4   Title Patient to improve B LE by >/= 1 grade (10/24/16)   Status New               Plan - 08/29/16 0851    Clinical Impression Statement Patient is a 66 y/o female presenting to [facility] today with primary complaints of low back pain with no known mechanism of injury. Patient today with noted tightness of B quad and HS musculature, poor psoturing, tenderness along B lumbar and thoracic paraspinals as well as reduced lumbar AROM with associated pain/pressure. Patient to benefit from  PT to address the above listed deficits to allow for improved functional mobility with reduced pain.    Rehab Potential Good   PT Frequency 2x / week   PT Duration 8 weeks   PT Treatment/Interventions ADLs/Self Care Home Management;Cryotherapy;Electrical Stimulation;Moist Heat;Traction;Ultrasound;Neuromuscular re-education;Balance training;Therapeutic exercise;Therapeutic activities;Functional mobility training;Stair training;Gait training;Patient/family education;Manual techniques;Passive range of motion;Taping;Dry needling   Consulted and Agree with Plan of Care Patient      Patient will benefit from skilled therapeutic intervention in order to improve the following deficits and impairments:  Decreased activity tolerance, Decreased range of motion, Decreased mobility, Decreased strength, Pain, Difficulty walking  Visit Diagnosis: Acute low back pain, unspecified back pain laterality, with sciatica presence unspecified - Plan: PT plan of care cert/re-cert  Abnormal posture - Plan: PT plan of care cert/re-cert  Muscle weakness (generalized) - Plan: PT plan of care cert/re-cert     Problem List Patient Active Problem List   Diagnosis Date Noted  . Peripheral edema 12/13/2014  . Atherosclerosis of aorta ([place]) 04/12/2014  . Rash and nonspecific skin eruption 03/31/2014  . Joint stiffness 03/31/2014  . Dysuria 02/08/2014  . Essential hypertension 02/08/2014  . Atypical chest pain 01/21/2014  . Chest pain 07/27/2013  . Hyperglycemia 07/27/2013  . Preventative health care 03/30/2013  . Left shoulder pain 11/14/2012  . Osteopenia 08/31/2012  . Back pain 03/25/2012  . Knee pain 03/25/2012  .  Allergic state 03/25/2012  . HELICOBACTER PYLORI INFECTION 06/08/2010  . Hiatal hernia with gastroesophageal reflux 04/27/2010  . Hyperlipidemia, mixed 04/07/2010  . Sleep apnea 04/07/2010     Lanney Gins, PT, DPT 08/29/16 11:41 AM   [facility] [address]  [place], [place], [address] Phone: [phone]   Fax:  [phone]  Name: Margaret [name] MRN: [id number] Date of Birth: 09/25/1950

## 2016-08-29 NOTE — Patient Instructions (Signed)
Hamstring Step 2   Left foot relaxed, knee straight, other leg bent, foot flat. Raise straight leg further upward to maximal range. Hold __30_ seconds. Relax leg completely down. Repeat _3__ times.    Single knee to Chest  Pull 1 knee to chest at a time - hold 10-15 seconds - repeat 5 times each leg    Lower Lumbar rotation  Lying with knees bent - let knees fall to each side - 10 x for 10 second hold each   Bridge   Lie back, legs bent. Inhale, pressing hips up. Keeping ribs in, lengthen lower back. Exhale, rolling down along spine from top. Repeat __15__ times. Do _2___ sessions per day.

## 2016-09-01 ENCOUNTER — Ambulatory Visit: Payer: BLUE CROSS/BLUE SHIELD

## 2016-09-01 DIAGNOSIS — M545 Low back pain: Secondary | ICD-10-CM | POA: Diagnosis not present

## 2016-09-01 DIAGNOSIS — M6281 Muscle weakness (generalized): Secondary | ICD-10-CM

## 2016-09-01 DIAGNOSIS — R293 Abnormal posture: Secondary | ICD-10-CM

## 2016-09-01 NOTE — Therapy (Signed)
[facility] [address]  [place], [place], [address] Phone: [phone]   Fax:  [phone]  Physical Therapy Treatment  Patient Details  Name: Margaret [name] MRN: [id number] Date of Birth: May 31, 1950 Referring Provider: Dr. Karlton Lemon  Encounter Date: 09/01/2016      PT End of Session - 09/01/16 0943    Visit Number 2   Number of Visits 16   Date for PT Re-Evaluation 10/24/16   PT Start Time 0934   PT Stop Time 1030   PT Time Calculation (min) 56 min   Activity Tolerance Patient tolerated treatment well   Behavior During Therapy [facility] for tasks assessed/performed      Past Medical History:  Diagnosis Date  . Allergic state 03/25/2012  . Aortic calcification ([place]) 08/27/2014  . At risk for colon cancer    Last colonoscopy in [place] 03/2009 found hyperplastic polyp only  . Atherosclerosis of aorta ([place]) 04/12/2014  . Chest pain   . GERD (gastroesophageal reflux disease)   . Hiatal hernia with gastroesophageal reflux 04/27/2010   Qualifier: Diagnosis of  By: Danelle Earthly CMA, Darlene  Failed Omeprazole and Protonix   . Hyperlipidemia   . Obesity   . Osteopenia 08/31/2012  . Peripheral edema 12/13/2014  . Sleep apnea   . Unspecified sinusitis (chronic) 03/30/2013    Past Surgical History:  Procedure Laterality Date  . [place] STUDY N/A 12/15/2015   Procedure: [place] STUDY;  Surgeon: Mauri Pole, MD;  [facility];  Service: Endoscopy;  Laterality: N/A;  . DILATION AND CURETTAGE OF UTERUS    . ESOPHAGEAL MANOMETRY N/A 12/15/2015   Procedure: ESOPHAGEAL MANOMETRY (EM);  Surgeon: Mauri Pole, MD;  [facility];  Service: Endoscopy;  Laterality: N/A;    There were no vitals filed for this visit.      Subjective Assessment - 09/01/16 0937    Subjective Pt. reporting pressure mostly in L sided lower back today.     Patient Stated Goals Improve pain, function   Currently in  Pain? Yes   Pain Score 6    Pain Location Back   Pain Orientation Lower;Left   Pain Descriptors / Indicators Pressure   Pain Type Acute pain   Pain Onset More than a month ago   Multiple Pain Sites No                         [facility] Adult PT Treatment/Exercise - 09/01/16 0948      Lumbar Exercises: Stretches   Passive Hamstring Stretch 30 seconds;2 reps  pt. instructed to use bath towel for this at home    Passive Hamstring Stretch Limitations supine with strap - bilateral   Single Knee to Chest Stretch 2 reps;30 seconds   Single Knee to Chest Stretch Limitations bilateral   Double Knee to Chest Stretch 1 rep;60 seconds   Double Knee to Chest Stretch Limitations with heels on peanut p-ball with therapist   Lower Trunk Rotation 5 reps;10 seconds   Lower Trunk Rotation Limitations bilateral   Quad Stretch 2 reps;30 seconds   Quad Stretch Limitations bilaterally with strap      Lumbar Exercises: Aerobic   Stationary Bike NuStep: lvl 4, 8 min      Lumbar Exercises: Supine   Bridge 10 reps   Other Supine Lumbar Exercises Hooklying bridge with sustained hip abduction into green TB 3" x 10 reps  Modalities   Modalities Electrical Stimulation;Moist Heat     Moist Heat Therapy   Number Minutes Moist Heat 15 Minutes   Moist Heat Location Lumbar Spine  L sided paraspinals      Electrical Stimulation   Electrical Stimulation Location lumbar (L sided paraspinals)   Electrical Stimulation Action IFC    Electrical Stimulation Parameters 80-150Hz ; intensity to pt. tolerance, 15 min    Electrical Stimulation Goals Tone;Pain     Manual Therapy   Manual Therapy Soft tissue mobilization   Manual therapy comments Prone    Soft tissue mobilization STM to pt. tolerance to lumbar paraspinals                   PT Short Term Goals - 09/01/16 0944      PT SHORT TERM GOAL #1   Title Patient to be independent with HEP (09/26/16)   Status On-going     PT SHORT  TERM GOAL #2   Title Patient to demonstrate good posture and body mechanics to reduced stressors placed on low back musculature for reduced pain (09/26/16)   Status On-going           PT Long Term Goals - 09/01/16 0944      PT LONG TERM GOAL #1   Title Patient to be independent with advanced HEP (10/24/16)   Status On-going     PT LONG TERM GOAL #2   Title Patient to report ability to ambulate for >/= 1 mile with no increase in back pain (10/24/16)   Status On-going     PT LONG TERM GOAL #3   Title Patient to report ability to perform transfers from various positions with no increase in pain (10/24/16)   Status On-going     PT LONG TERM GOAL #4   Title Patient to improve B LE by >/= 1 grade (10/24/16)   Status On-going               Plan - 09/01/16 0944    Clinical Impression Statement Pt. requiring cueing with HEP review today however able to perform all activities by end of treatment with good technique.  Pt. instructed on use of towel for HS stretch due to not performing because she does not have a strap.  Today's treatment focusing on gentle lumbar ROM and lumbopelvic strengthening activity per tolerance.  Manual STM to lumbar paraspinals with pt. confirming good relief with this.  Pain well controlled in hooklying positioning today.  E-stim/moist heat to end treatment to L-sided Lumbar to promote muscular relaxation and reduce post-exercise pain.     PT Treatment/Interventions ADLs/Self Care Home Management;Cryotherapy;Electrical Stimulation;Moist Heat;Traction;Ultrasound;Neuromuscular re-education;Balance training;Therapeutic exercise;Therapeutic activities;Functional mobility training;Stair training;Gait training;Patient/family education;Manual techniques;Passive range of motion;Taping;Dry needling   PT Next Visit Plan Progress to more standing lumbopelvic stability activities per tolerance;       Patient will benefit from skilled therapeutic intervention in order to  improve the following deficits and impairments:  Decreased activity tolerance, Decreased range of motion, Decreased mobility, Decreased strength, Pain, Difficulty walking  Visit Diagnosis: Acute low back pain, unspecified back pain laterality, with sciatica presence unspecified  Abnormal posture  Muscle weakness (generalized)     Problem List Patient Active Problem List   Diagnosis Date Noted  . Peripheral edema 12/13/2014  . Atherosclerosis of aorta ([place]) 04/12/2014  . Rash and nonspecific skin eruption 03/31/2014  . Joint stiffness 03/31/2014  . Dysuria 02/08/2014  . Essential hypertension 02/08/2014  . Atypical chest pain 01/21/2014  .  Chest pain 07/27/2013  . Hyperglycemia 07/27/2013  . Preventative health care 03/30/2013  . Left shoulder pain 11/14/2012  . Osteopenia 08/31/2012  . Back pain 03/25/2012  . Knee pain 03/25/2012  . Allergic state 03/25/2012  . HELICOBACTER PYLORI INFECTION 06/08/2010  . Hiatal hernia with gastroesophageal reflux 04/27/2010  . Hyperlipidemia, mixed 04/07/2010  . Sleep apnea 04/07/2010    Bess Harvest, PTA 09/01/16 12:34 PM  [facility] [address]  [place], [place], [address] Phone: [phone]   Fax:  [phone]  Name: Margaret [name] MRN: [id number] Date of Birth: Oct 01, 1950

## 2016-09-04 ENCOUNTER — Ambulatory Visit: Payer: BLUE CROSS/BLUE SHIELD

## 2016-09-04 DIAGNOSIS — M6281 Muscle weakness (generalized): Secondary | ICD-10-CM

## 2016-09-04 DIAGNOSIS — R293 Abnormal posture: Secondary | ICD-10-CM

## 2016-09-04 DIAGNOSIS — M545 Low back pain: Secondary | ICD-10-CM | POA: Diagnosis not present

## 2016-09-04 NOTE — Therapy (Signed)
[facility] [address]  [place], [place], [address] Phone: [phone]   Fax:  [phone]  Physical Therapy Treatment  Patient Details  Name: Margaret [name] MRN: [id number] Date of Birth: 1950-10-14 Referring Provider: Dr. Karlton Lemon  Encounter Date: 09/04/2016      PT End of Session - 09/04/16 1321    Visit Number 3   Number of Visits 16   Date for PT Re-Evaluation 10/24/16   PT Start Time 8270   PT Stop Time 1413   PT Time Calculation (min) 59 min   Activity Tolerance Patient tolerated treatment well   Behavior During Therapy [facility] for tasks assessed/performed      Past Medical History:  Diagnosis Date  . Allergic state 03/25/2012  . Aortic calcification ([place]) 08/27/2014  . At risk for colon cancer    Last colonoscopy in [place] 03/2009 found hyperplastic polyp only  . Atherosclerosis of aorta ([place]) 04/12/2014  . Chest pain   . GERD (gastroesophageal reflux disease)   . Hiatal hernia with gastroesophageal reflux 04/27/2010   Qualifier: Diagnosis of  By: Danelle Earthly CMA, Darlene  Failed Omeprazole and Protonix   . Hyperlipidemia   . Obesity   . Osteopenia 08/31/2012  . Peripheral edema 12/13/2014  . Sleep apnea   . Unspecified sinusitis (chronic) 03/30/2013    Past Surgical History:  Procedure Laterality Date  . [place] STUDY N/A 12/15/2015   Procedure: [place] STUDY;  Surgeon: Mauri Pole, MD;  Location: [facility];  Service: Endoscopy;  Laterality: N/A;  . DILATION AND CURETTAGE OF UTERUS    . ESOPHAGEAL MANOMETRY N/A 12/15/2015   Procedure: ESOPHAGEAL MANOMETRY (EM);  Surgeon: Mauri Pole, MD;  Location: [facility];  Service: Endoscopy;  Laterality: N/A;    There were no vitals filed for this visit.      Subjective Assessment - 09/04/16 1316    Subjective Pt. reporting LBP has improved over the weekend.     Patient Stated Goals Improve pain, function   Currently in Pain? Yes    Pain Score 3    Pain Location Back   Pain Orientation Lower;Left   Pain Descriptors / Indicators Pressure   Pain Type Acute pain   Pain Onset More than a month ago   Pain Frequency Intermittent   Aggravating Factors  prolonged walking, standing    Pain Relieving Factors Moving frequently    Effect of Pain on Daily Activities decreased speed climbing stairs    Multiple Pain Sites No                         [facility] Adult PT Treatment/Exercise - 09/04/16 1318      Lumbar Exercises: Stretches   Passive Hamstring Stretch 30 seconds;2 reps   Passive Hamstring Stretch Limitations supine with strap - bilateral   Double Knee to Chest Stretch 1 rep;60 seconds   Double Knee to Chest Stretch Limitations with heels on peanut p-ball with therapist   Quad Stretch 2 reps;30 seconds   Quad Stretch Limitations mod thomas position; bilaterally with strap      Lumbar Exercises: Aerobic   Stationary Bike NuStep: lvl 5, 6 min      Lumbar Exercises: Standing   Functional Squats 10 reps;3 seconds   Functional Squats Limitations minisquat at [employer] AROM;Strengthening;Both;10 reps   Theraband Level (Row) Level 3 (Green)   Row Limitations 3" hold  Shoulder Extension AROM;Strengthening;Both;10 reps;Theraband   Theraband Level (Shoulder Extension) Level 3 (Green)   Shoulder Extension Limitations 3" hold    Other Standing Lumbar Exercises Standing red TB x 15 reps      Lumbar Exercises: Supine   Bridge 15 reps  3" hold    Bridge Limitations with sustained hip abd/ER with green TB    Other Supine Lumbar Exercises B Open book stretch x 30 sec each way     Lumbar Exercises: Prone   Other Prone Lumbar Exercises 3-way prone childs pose lumbar stretch x 30 sec each way     Lumbar Exercises: Quadruped   Straight Leg Raise 5 reps;2 seconds   Straight Leg Raises Limitations with peanut p-ball support     Moist Heat Therapy   Number Minutes Moist Heat 15 Minutes   Moist Heat  Location Lumbar Spine     Electrical Stimulation   Electrical Stimulation Location lumbar (L sided paraspinals)  hooklying    Electrical Stimulation Action IFC   Electrical Stimulation Parameters 80-150Hz , intensity to pt. tolerance, 15'    Electrical Stimulation Goals Tone;Pain                  PT Short Term Goals - 09/01/16 0944      PT SHORT TERM GOAL #1   Title Patient to be independent with HEP (09/26/16)   Status On-going     PT SHORT TERM GOAL #2   Title Patient to demonstrate good posture and body mechanics to reduced stressors placed on low back musculature for reduced pain (09/26/16)   Status On-going           PT Long Term Goals - 09/01/16 0944      PT LONG TERM GOAL #1   Title Patient to be independent with advanced HEP (10/24/16)   Status On-going     PT LONG TERM GOAL #2   Title Patient to report ability to ambulate for >/= 1 mile with no increase in back pain (10/24/16)   Status On-going     PT LONG TERM GOAL #3   Title Patient to report ability to perform transfers from various positions with no increase in pain (10/24/16)   Status On-going     PT LONG TERM GOAL #4   Title Patient to improve B LE by >/= 1 grade (10/24/16)   Status On-going               Plan - 09/04/16 1321    Clinical Impression Statement Pt. reporting some improvement in LBP over weekend and reports adherence to HEP.  Lumbopelvic strengthening activities progressed to more standing activities today and pt. tolerated this well.  Some pain increase with mini squats, and narrow stance pallof press however this self-resolved to baseline following activity.  E-stim/moist heat to end treatment to promote muscular relaxation and reduce post-exercise pain and muscular tension.    PT Treatment/Interventions ADLs/Self Care Home Management;Cryotherapy;Electrical Stimulation;Moist Heat;Traction;Ultrasound;Neuromuscular re-education;Balance training;Therapeutic exercise;Therapeutic  activities;Functional mobility training;Stair training;Gait training;Patient/family education;Manual techniques;Passive range of motion;Taping;Dry needling   PT Next Visit Plan Progress standing lumbopelvic stability activities per tolerance; monitor adherence to HEP      Patient will benefit from skilled therapeutic intervention in order to improve the following deficits and impairments:  Decreased activity tolerance, Decreased range of motion, Decreased mobility, Decreased strength, Pain, Difficulty walking  Visit Diagnosis: Acute low back pain, unspecified back pain laterality, with sciatica presence unspecified  Abnormal posture  Muscle weakness (generalized)  Problem List Patient Active Problem List   Diagnosis Date Noted  . Peripheral edema 12/13/2014  . Atherosclerosis of aorta ([place]) 04/12/2014  . Rash and nonspecific skin eruption 03/31/2014  . Joint stiffness 03/31/2014  . Dysuria 02/08/2014  . Essential hypertension 02/08/2014  . Atypical chest pain 01/21/2014  . Chest pain 07/27/2013  . Hyperglycemia 07/27/2013  . Preventative health care 03/30/2013  . Left shoulder pain 11/14/2012  . Osteopenia 08/31/2012  . Back pain 03/25/2012  . Knee pain 03/25/2012  . Allergic state 03/25/2012  . HELICOBACTER PYLORI INFECTION 06/08/2010  . Hiatal hernia with gastroesophageal reflux 04/27/2010  . Hyperlipidemia, mixed 04/07/2010  . Sleep apnea 04/07/2010   Bess Harvest, PTA 09/04/16 6:20 PM  [facility] [address]  [place], [place], [address] Phone: [phone]   Fax:  [phone]  Name: Margaret [name] MRN: [id number] Date of Birth: [birth year]

## 2016-09-05 ENCOUNTER — Observation Stay (HOSPITAL_BASED_OUTPATIENT_CLINIC_OR_DEPARTMENT_OTHER)
Admission: EM | Admit: 2016-09-05 | Discharge: 2016-09-07 | Disposition: A | Discharge status: Home / Self Care | Payer: BLUE CROSS/BLUE SHIELD | Attending: Nephrology | Admitting: Nephrology

## 2016-09-05 ENCOUNTER — Encounter (HOSPITAL_BASED_OUTPATIENT_CLINIC_OR_DEPARTMENT_OTHER): Payer: Self-pay | Admitting: Emergency Medicine

## 2016-09-05 DIAGNOSIS — R030 Elevated blood-pressure reading, without diagnosis of hypertension: Secondary | ICD-10-CM | POA: Diagnosis not present

## 2016-09-05 DIAGNOSIS — K219 Gastro-esophageal reflux disease without esophagitis: Secondary | ICD-10-CM | POA: Insufficient documentation

## 2016-09-05 DIAGNOSIS — R200 Anesthesia of skin: Secondary | ICD-10-CM | POA: Diagnosis present

## 2016-09-05 DIAGNOSIS — E669 Obesity, unspecified: Secondary | ICD-10-CM | POA: Insufficient documentation

## 2016-09-05 DIAGNOSIS — Z6831 Body mass index (BMI) 31.0-31.9, adult: Secondary | ICD-10-CM | POA: Diagnosis not present

## 2016-09-05 DIAGNOSIS — G4730 Sleep apnea, unspecified: Secondary | ICD-10-CM | POA: Diagnosis present

## 2016-09-05 DIAGNOSIS — Z79899 Other long term (current) drug therapy: Secondary | ICD-10-CM | POA: Insufficient documentation

## 2016-09-05 DIAGNOSIS — R29898 Other symptoms and signs involving the musculoskeletal system: Secondary | ICD-10-CM

## 2016-09-05 DIAGNOSIS — M5412 Radiculopathy, cervical region: Secondary | ICD-10-CM

## 2016-09-05 DIAGNOSIS — E782 Mixed hyperlipidemia: Secondary | ICD-10-CM | POA: Diagnosis not present

## 2016-09-05 DIAGNOSIS — Z791 Long term (current) use of non-steroidal anti-inflammatories (NSAID): Secondary | ICD-10-CM | POA: Insufficient documentation

## 2016-09-05 DIAGNOSIS — G459 Transient cerebral ischemic attack, unspecified: Secondary | ICD-10-CM | POA: Diagnosis not present

## 2016-09-05 DIAGNOSIS — G4733 Obstructive sleep apnea (adult) (pediatric): Secondary | ICD-10-CM | POA: Diagnosis not present

## 2016-09-05 LAB — DIFFERENTIAL
BASOS ABS: 0 10*3/uL (ref 0.0–0.1)
Basophils Relative: 1 %
Eosinophils Absolute: 0.3 10*3/uL (ref 0.0–0.7)
Eosinophils Relative: 5 %
LYMPHS ABS: 2.8 10*3/uL (ref 0.7–4.0)
LYMPHS PCT: 45 %
Monocytes Absolute: 0.6 10*3/uL (ref 0.1–1.0)
Monocytes Relative: 10 %
NEUTROS PCT: 39 %
Neutro Abs: 2.4 10*3/uL (ref 1.7–7.7)

## 2016-09-05 LAB — CBC
HEMATOCRIT: 39.8 % (ref 36.0–46.0)
Hemoglobin: 13.5 g/dL (ref 12.0–15.0)
MCH: 29.3 pg (ref 26.0–34.0)
MCHC: 33.9 g/dL (ref 30.0–36.0)
MCV: 86.5 fL (ref 78.0–100.0)
Platelets: 213 10*3/uL (ref 150–400)
RBC: 4.6 MIL/uL (ref 3.87–5.11)
RDW: 13.3 % (ref 11.5–15.5)
WBC: 6.1 10*3/uL (ref 4.0–10.5)

## 2016-09-05 LAB — URINALYSIS, ROUTINE W REFLEX MICROSCOPIC
Bilirubin Urine: NEGATIVE
Glucose, UA: NEGATIVE mg/dL
Hgb urine dipstick: NEGATIVE
Ketones, ur: NEGATIVE mg/dL
LEUKOCYTES UA: NEGATIVE
NITRITE: NEGATIVE
PH: 7.5 (ref 5.0–8.0)
Protein, ur: NEGATIVE mg/dL
SPECIFIC GRAVITY, URINE: 1.007 (ref 1.005–1.030)

## 2016-09-05 LAB — CBG MONITORING, ED: GLUCOSE-CAPILLARY: 111 mg/dL — AB (ref 65–99)

## 2016-09-05 NOTE — ED Provider Notes (Addendum)
[facility] DEPT [facility] Provider Note: Georgena Spurling, MD, FACEP  By signing my name below, I, Margaret [name], attest that this documentation has been prepared under the direction and in the presence of Shanon Rosser, MD. Electronically Signed: Hansel [name], ED Scribe. 09/05/16. 11:49 PM.    CSN: [id number] MRN: [id number] ARRIVAL: 09/05/16 at 2238 ROOM: MH07/MH07   CHIEF COMPLAINT  Numbness (right hand)   HISTORY OF PRESENT ILLNESS  Margaret [name] is a 66 y.o. female who presents to the Emergency Department complaining of an episode of right hand weakness and numbness that began at 10 pm and lasted several minutes. Pt states she was having difficulty picking up objects during the episode as she could not get a grip or feel the object. She reports that she massaged the hand with resolution of the weakness. No h/o of similar symptoms. She does report some epigastric pain that began at 4 pm after eating, which she states is similar to prior h/o GERD. She notes she normally takes Zantac before bed, but has not tried any for her current pain. She denies numbness or weakness to the right arm, face or leg, HA, blurred vision, SOB, nausea or vomiting.   Past Medical History:  Diagnosis Date  . Allergic state 03/25/2012  . Aortic calcification ([place]) 08/27/2014  . At risk for colon cancer    Last colonoscopy in [place] 03/2009 found hyperplastic polyp only  . Atherosclerosis of aorta ([place]) 04/12/2014  . Chest pain   . GERD (gastroesophageal reflux disease)   . Hiatal hernia with gastroesophageal reflux 04/27/2010   Qualifier: Diagnosis of  By: Danelle Earthly CMA, Darlene  Failed Omeprazole and Protonix   . Hyperlipidemia   . Obesity   . Osteopenia 08/31/2012  . Peripheral edema 12/13/2014  . Sleep apnea   . Unspecified sinusitis (chronic) 03/30/2013    Past Surgical History:  Procedure Laterality Date  . [place] STUDY N/A 12/15/2015   Procedure: [place] STUDY;  Surgeon: Mauri Pole, MD;   [facility];  Service: Endoscopy;  Laterality: N/A;  . DILATION AND CURETTAGE OF UTERUS    . ESOPHAGEAL MANOMETRY N/A 12/15/2015   Procedure: ESOPHAGEAL MANOMETRY (EM);  Surgeon: Mauri Pole, MD;  [facility];  Service: Endoscopy;  Laterality: N/A;    Family History  Problem Relation Age of Onset  . Stroke Mother   . Lung cancer Father   . Stroke Sister     44  . Thyroid disease Sister   . Diabetes Sister   . Hypertension Sister   . Hyperlipidemia Sister   . Hypertension Sister   . Sleep apnea Son   . Obesity Son     Social History  Substance Use Topics  . Smoking status: Never Smoker  . Smokeless tobacco: Never Used  . Alcohol use 0.0 oz/week     Comment: Rarely    Prior to Admission medications   Medication Sig Start Date End Date Taking? Authorizing Provider  fish oil-omega-3 fatty acids 1000 MG capsule Take 2 g by mouth daily.    Historical Provider, MD  fluticasone (FLONASE) 50 MCG/ACT nasal spray Place 2 sprays into both nostrils daily. 02/21/16   Percell Miller Saguier, PA-C  meloxicam (MOBIC) 15 MG tablet Take 1 tablet (15 mg total) by mouth daily. 08/17/16   Dene Gentry, MD  MULTIPLE VITAMINS PO Take by mouth daily.    Historical Provider, MD  nortriptyline (PAMELOR) 10 MG capsule Take 1 capsule (10 mg total)  by mouth at bedtime. 01/11/16   Mauri Pole, MD  omeprazole (PRILOSEC) 20 MG capsule Take 1 capsule (20 mg total) by mouth daily as needed. 07/21/16   Mosie Lukes, MD  Probiotic Product (VSL#3) CAPS 1 capsule a day 11/15/15   Mauri Pole, MD  ranitidine (ZANTAC) 300 MG tablet TAKE 1 TABLET BY MOUTH AT BEDTIME 07/10/16   Mauri Pole, MD  rosuvastatin (CRESTOR) 10 MG tablet TAKE 1 TABLET BY MOUTH DAILY. 08/14/16   Mosie Lukes, MD    Allergies No known allergies   REVIEW OF SYSTEMS  Negative except as noted here or in the History of Present Illness.   PHYSICAL EXAMINATION  Initial Vital Signs Blood pressure (!)  182/80, pulse 64, temperature 99 F (37.2 C), temperature source Oral, resp. rate 18, height 5\' 2"  (1.575 m), weight 170 lb (77.1 kg), SpO2 100 %.  Examination General: Well-developed, well-nourished female in no acute distress; appearance consistent with age of record HENT: normocephalic; atraumatic Eyes: pupils equal, round and reactive to light; extraocular muscles intact Neck: supple; no bruit Heart: regular rate and rhythm Lungs: clear to auscultation bilaterally Abdomen: soft; nondistended; no masses or hepatosplenomegaly; bowel sounds present; mild epigastric tenderness  Extremities: No deformity; full range of motion; pulses normal Neurologic: Awake, alert and oriented; motor function intact in all extremities and symmetric except slight weakness in right hand grip; no facial droop; sensation intact bilaterally and symmetric  Skin: Warm and dry Psychiatric: Normal mood and affect   RESULTS  Summary of this visit's results, reviewed by myself:   EKG Interpretation  Date/Time:  Tuesday September 05 2016 22:59:45 EDT Ventricular Rate:  67 PR Interval:  154 QRS Duration: 88 QT Interval:  388 QTC Calculation: 409 R Axis:   55 Text Interpretation:  Normal sinus rhythm Normal ECG Rate is faster Confirmed by Trinia Georgi  MD, Jenny Reichmann ([id number]) on 09/05/2016 11:03:13 PM      Laboratory Studies: Results for orders placed or performed during the hospital encounter of 09/05/16 (from the past 24 hour(s))  Basic metabolic panel     Status: Abnormal   Collection Time: 09/05/16 11:14 PM  Result Value Ref Range   Sodium 139 135 - 145 mmol/L   Potassium 3.9 3.5 - 5.1 mmol/L   Chloride 105 101 - 111 mmol/L   CO2 27 22 - 32 mmol/L   Glucose, Bld 106 (H) 65 - 99 mg/dL   BUN 11 6 - 20 mg/dL   Creatinine, Ser 0.68 0.44 - 1.00 mg/dL   Calcium 9.5 8.9 - 10.3 mg/dL   GFR calc non Af Amer >60 >60 mL/min   GFR calc Af Amer >60 >60 mL/min   Anion gap 7 5 - 15  CBC     Status: None   Collection Time:  09/05/16 11:14 PM  Result Value Ref Range   WBC 6.1 4.0 - 10.5 K/uL   RBC 4.60 3.87 - 5.11 MIL/uL   Hemoglobin 13.5 12.0 - 15.0 g/dL   HCT 39.8 36.0 - 46.0 %   MCV 86.5 78.0 - 100.0 fL   MCH 29.3 26.0 - 34.0 pg   MCHC 33.9 30.0 - 36.0 g/dL   RDW 13.3 11.5 - 15.5 %   Platelets 213 150 - 400 K/uL  Urinalysis, Routine w reflex microscopic     Status: None   Collection Time: 09/05/16 11:14 PM  Result Value Ref Range   Color, Urine YELLOW YELLOW   APPearance CLEAR CLEAR   Specific  Gravity, Urine 1.007 1.005 - 1.030   pH 7.5 5.0 - 8.0   Glucose, UA NEGATIVE NEGATIVE mg/dL   Hgb urine dipstick NEGATIVE NEGATIVE   Bilirubin Urine NEGATIVE NEGATIVE   Ketones, ur NEGATIVE NEGATIVE mg/dL   Protein, ur NEGATIVE NEGATIVE mg/dL   Nitrite NEGATIVE NEGATIVE   Leukocytes, UA NEGATIVE NEGATIVE  Differential     Status: None   Collection Time: 09/05/16 11:14 PM  Result Value Ref Range   Neutrophils Relative % 39 %   Neutro Abs 2.4 1.7 - 7.7 K/uL   Lymphocytes Relative 45 %   Lymphs Abs 2.8 0.7 - 4.0 K/uL   Monocytes Relative 10 %   Monocytes Absolute 0.6 0.1 - 1.0 K/uL   Eosinophils Relative 5 %   Eosinophils Absolute 0.3 0.0 - 0.7 K/uL   Basophils Relative 1 %   Basophils Absolute 0.0 0.0 - 0.1 K/uL  CBG monitoring, ED     Status: Abnormal   Collection Time: 09/05/16 11:53 PM  Result Value Ref Range   Glucose-Capillary 111 (H) 65 - 99 mg/dL   Imaging Studies: Ct Head Wo Contrast  Result Date: 09/06/2016 CLINICAL DATA:  Right hand weakness for 2 hours EXAM: CT HEAD WITHOUT CONTRAST TECHNIQUE: Contiguous axial images were obtained from the base of the skull through the vertex without intravenous contrast. COMPARISON:  Head CT 02/23/2016 FINDINGS: Brain: No mass lesion, intraparenchymal hemorrhage or extra-axial collection. No evidence of acute cortical infarct. Brain parenchyma and CSF-containing spaces are normal for age. Vascular: No hyperdense vessel or unexpected calcification. Skull:  Normal visualized skull base, calvarium and extracranial soft tissues. Sinuses/Orbits: Frothy secretions in the right maxillary sinus. Otherwise clear. Normal orbits. ASPECTS [facility] Stroke Program Early CT Score) - Ganglionic level infarction (caudate, lentiform nuclei, internal capsule, insula, M1-M3 cortex): 7 - Supraganglionic infarction (M4-M6 cortex): 3 Total score (0-10 with 10 being normal): 10 IMPRESSION: 1. Normal head CT. 2. ASPECTS is 10. These results were called by telephone at the time of interpretation on 09/06/2016 at 12:50 am to Dr. Shanon Rosser , who verbally acknowledged these results. Electronically Signed   By: Ulyses Jarred M.D.   On: 09/06/2016 00:51    ED COURSE  Nursing notes and initial vitals signs, including pulse oximetry, reviewed.  Vitals:   09/05/16 2259 09/05/16 2330 09/06/16 0100 09/06/16 0139  BP: (!) 182/80 (!) 143/67  129/62  Pulse: 64 (!) 58 (!) 55 (!) 59  Resp: 18 15 13 19   Temp: 99 F (37.2 C)     TempSrc: Oral     SpO2: 100% 98% 97% 97%  Weight:      Height:       1:46 AM Neuro exam unchanged. Patient's acid reflux symptoms improved with IV Protonix and oral Carafate. We will have her admitted for TIA workup. Dr. Hal Hope accepts for transfer to [facility]. Dr. Leonel Ramsay of neurology consulted.  PROCEDURES    ED DIAGNOSES     ICD-9-CM ICD-10-CM   1. Weakness of right hand 728.87 R29.898   2. Numbness of right hand 782.0 R20.0      I personally performed the services described in this documentation, which was scribed in my presence. The recorded information has been reviewed and is accurate.    Shanon Rosser, MD 09/06/16 0147    Shanon Rosser, MD 09/06/16 6834

## 2016-09-05 NOTE — ED Notes (Signed)
ED Provider at bedside. 

## 2016-09-05 NOTE — ED Triage Notes (Signed)
Patient reports that she had numbness that started about 1 hour ago to her right hand. Patient states that after massaging her hand the numbness cleared. The patient reports that she noted some weakness to her right hand. No noted weakness at this time. Patient reports some burning to her chest as well  - she thought is was acid reflux

## 2016-09-06 ENCOUNTER — Emergency Department (HOSPITAL_BASED_OUTPATIENT_CLINIC_OR_DEPARTMENT_OTHER): Payer: BLUE CROSS/BLUE SHIELD

## 2016-09-06 ENCOUNTER — Observation Stay (HOSPITAL_BASED_OUTPATIENT_CLINIC_OR_DEPARTMENT_OTHER): Payer: BLUE CROSS/BLUE SHIELD

## 2016-09-06 ENCOUNTER — Encounter (HOSPITAL_BASED_OUTPATIENT_CLINIC_OR_DEPARTMENT_OTHER): Payer: Self-pay

## 2016-09-06 ENCOUNTER — Observation Stay (HOSPITAL_COMMUNITY): Payer: BLUE CROSS/BLUE SHIELD

## 2016-09-06 DIAGNOSIS — E782 Mixed hyperlipidemia: Secondary | ICD-10-CM

## 2016-09-06 DIAGNOSIS — R42 Dizziness and giddiness: Secondary | ICD-10-CM

## 2016-09-06 DIAGNOSIS — R29898 Other symptoms and signs involving the musculoskeletal system: Secondary | ICD-10-CM

## 2016-09-06 DIAGNOSIS — I639 Cerebral infarction, unspecified: Secondary | ICD-10-CM | POA: Insufficient documentation

## 2016-09-06 DIAGNOSIS — G459 Transient cerebral ischemic attack, unspecified: Secondary | ICD-10-CM

## 2016-09-06 DIAGNOSIS — G451 Carotid artery syndrome (hemispheric): Secondary | ICD-10-CM | POA: Diagnosis not present

## 2016-09-06 DIAGNOSIS — Z8673 Personal history of transient ischemic attack (TIA), and cerebral infarction without residual deficits: Secondary | ICD-10-CM | POA: Insufficient documentation

## 2016-09-06 DIAGNOSIS — G458 Other transient cerebral ischemic attacks and related syndromes: Secondary | ICD-10-CM

## 2016-09-06 LAB — COMPREHENSIVE METABOLIC PANEL
ALBUMIN: 3.8 g/dL (ref 3.5–5.0)
ALK PHOS: 72 U/L (ref 38–126)
ALT: 31 U/L (ref 14–54)
AST: 23 U/L (ref 15–41)
Anion gap: 9 (ref 5–15)
BILIRUBIN TOTAL: 0.6 mg/dL (ref 0.3–1.2)
BUN: 8 mg/dL (ref 6–20)
CO2: 29 mmol/L (ref 22–32)
Calcium: 9.5 mg/dL (ref 8.9–10.3)
Chloride: 104 mmol/L (ref 101–111)
Creatinine, Ser: 0.64 mg/dL (ref 0.44–1.00)
GFR calc Af Amer: >60 mL/min (ref >60)
GFR calc non Af Amer: >60 mL/min (ref >60)
GLUCOSE: 108 mg/dL — AB (ref 65–99)
POTASSIUM: 3.8 mmol/L (ref 3.5–5.1)
Sodium: 142 mmol/L (ref 135–145)
TOTAL PROTEIN: 6.5 g/dL (ref 6.5–8.1)

## 2016-09-06 LAB — BASIC METABOLIC PANEL
Anion gap: 7 (ref 5–15)
BUN: 11 mg/dL (ref 6–20)
CHLORIDE: 105 mmol/L (ref 101–111)
CO2: 27 mmol/L (ref 22–32)
CREATININE: 0.68 mg/dL (ref 0.44–1.00)
Calcium: 9.5 mg/dL (ref 8.9–10.3)
GFR calc Af Amer: >60 mL/min (ref >60)
GFR calc non Af Amer: >60 mL/min (ref >60)
GLUCOSE: 106 mg/dL — AB (ref 65–99)
POTASSIUM: 3.9 mmol/L (ref 3.5–5.1)
Sodium: 139 mmol/L (ref 135–145)

## 2016-09-06 LAB — CBC
HCT: 39.6 % (ref 36.0–46.0)
HEMOGLOBIN: 13.1 g/dL (ref 12.0–15.0)
MCH: 28.5 pg (ref 26.0–34.0)
MCHC: 33.1 g/dL (ref 30.0–36.0)
MCV: 86.1 fL (ref 78.0–100.0)
Platelets: 205 10*3/uL (ref 150–400)
RBC: 4.6 MIL/uL (ref 3.87–5.11)
RDW: 13.3 % (ref 11.5–15.5)
WBC: 6.1 10*3/uL (ref 4.0–10.5)

## 2016-09-06 LAB — VAS US CAROTID
LCCAPDIAS: 21 cm/s
LCCAPSYS: 86 cm/s
LEFT ECA DIAS: -20 cm/s
LEFT VERTEBRAL DIAS: -14 cm/s
Left CCA dist dias: 20 cm/s
Left CCA dist sys: 75 cm/s
Left ICA dist dias: -30 cm/s
Left ICA dist sys: -81 cm/s
Left ICA prox dias: -17 cm/s
Left ICA prox sys: -81 cm/s
RCCADSYS: -92 cm/s
RCCAPDIAS: 19 cm/s
RCCAPSYS: 86 cm/s
RIGHT ECA DIAS: -16 cm/s
RIGHT VERTEBRAL DIAS: 14 cm/s

## 2016-09-06 LAB — LIPID PANEL
Cholesterol: 150 mg/dL (ref 0–200)
HDL: 49 mg/dL (ref >40)
LDL CALC: 80 mg/dL (ref 0–99)
TRIGLYCERIDES: 106 mg/dL (ref <150)
Total CHOL/HDL Ratio: 3.1 RATIO
VLDL: 21 mg/dL (ref 0–40)

## 2016-09-06 LAB — ECHOCARDIOGRAM COMPLETE
Height: 62 in
Weight: 2730.18 oz

## 2016-09-06 LAB — HIV ANTIBODY (ROUTINE TESTING W REFLEX): HIV Screen 4th Generation wRfx: NONREACTIVE

## 2016-09-06 MED ORDER — ACETAMINOPHEN 160 MG/5ML PO SOLN: 650.0000 mg | ORAL | Status: DC | PRN

## 2016-09-06 MED ORDER — SUCRALFATE 1 G PO TABS
1.0000 g | ORAL_TABLET | Freq: Once | ORAL | Status: AC
  Administered 2016-09-06: 1 g via ORAL
  Filled 2016-09-06: qty 1

## 2016-09-06 MED ORDER — PANTOPRAZOLE SODIUM 40 MG IV SOLR
40.0000 mg | Freq: Once | INTRAVENOUS | Status: AC
  Administered 2016-09-06: 40 mg via INTRAVENOUS
  Filled 2016-09-06: qty 40

## 2016-09-06 MED ORDER — ENOXAPARIN SODIUM 40 MG/0.4ML ~~LOC~~ SOLN
40.0000 mg | SUBCUTANEOUS | Status: DC
  Administered 2016-09-06: 40 mg via SUBCUTANEOUS
  Filled 2016-09-06: qty 0.4

## 2016-09-06 MED ORDER — FLUTICASONE PROPIONATE 50 MCG/ACT NA SUSP
2.0000 | Freq: Every day | NASAL | Status: DC
  Filled 2016-09-06 (×2): qty 16

## 2016-09-06 MED ORDER — FAMOTIDINE 20 MG PO TABS
20.0000 mg | ORAL_TABLET | Freq: Every day | ORAL | Status: DC
  Administered 2016-09-06: 20 mg via ORAL
  Filled 2016-09-06: qty 1

## 2016-09-06 MED ORDER — PANTOPRAZOLE SODIUM 40 MG PO TBEC
40.0000 mg | DELAYED_RELEASE_TABLET | Freq: Every day | ORAL | Status: DC
  Administered 2016-09-06 – 2016-09-07 (×2): 40 mg via ORAL
  Filled 2016-09-06 (×2): qty 1

## 2016-09-06 MED ORDER — STROKE: EARLY STAGES OF RECOVERY BOOK
Freq: Once | Status: AC
  Administered 2016-09-06: 07:00:00
  Filled 2016-09-06: qty 1

## 2016-09-06 MED ORDER — ACETAMINOPHEN 650 MG RE SUPP: 650.0000 mg | RECTAL | Status: DC | PRN

## 2016-09-06 MED ORDER — NORTRIPTYLINE HCL 10 MG PO CAPS
10.0000 mg | ORAL_CAPSULE | Freq: Every day | ORAL | Status: DC
  Administered 2016-09-06: 10 mg via ORAL
  Filled 2016-09-06: qty 1

## 2016-09-06 MED ORDER — OMEGA-3-ACID ETHYL ESTERS 1 G PO CAPS
1.0000 g | ORAL_CAPSULE | Freq: Every day | ORAL | Status: DC
  Administered 2016-09-06 – 2016-09-07 (×2): 1 g via ORAL
  Filled 2016-09-06 (×2): qty 1

## 2016-09-06 MED ORDER — ASPIRIN 325 MG PO TABS
325.0000 mg | ORAL_TABLET | Freq: Every day | ORAL | Status: DC
  Administered 2016-09-06: 325 mg via ORAL
  Filled 2016-09-06: qty 1

## 2016-09-06 MED ORDER — ASPIRIN EC 325 MG PO TBEC
325.0000 mg | DELAYED_RELEASE_TABLET | Freq: Every day | ORAL | Status: DC
  Administered 2016-09-07: 325 mg via ORAL
  Filled 2016-09-06 (×2): qty 1

## 2016-09-06 MED ORDER — ACETAMINOPHEN 325 MG PO TABS: 650.0000 mg | ORAL_TABLET | ORAL | Status: DC | PRN

## 2016-09-06 MED ORDER — ASPIRIN 300 MG RE SUPP: 300.0000 mg | Freq: Every day | RECTAL | Status: DC

## 2016-09-06 MED ORDER — ROSUVASTATIN CALCIUM 5 MG PO TABS
10.0000 mg | ORAL_TABLET | Freq: Every day | ORAL | Status: DC
  Administered 2016-09-06 – 2016-09-07 (×2): 10 mg via ORAL
  Filled 2016-09-06 (×2): qty 2

## 2016-09-06 NOTE — Progress Notes (Signed)
Preliminary results by tech - Carotid Duplex Completed. Patent carotid system without evidence of stenosis. Vertebral arteries demonstrated antegrade flow.  Oda Cogan, BS, RDMS, RVT

## 2016-09-06 NOTE — Progress Notes (Signed)
Pt is on NIV at this time tolerating it well.  

## 2016-09-06 NOTE — Progress Notes (Signed)
SLP Cancellation Note  Patient Details Name: Margaret [name] MRN: [id number] DOB: May 24, 1951   Cancelled treatment:       Reason Eval/Treat Not Completed: SLP screened, no needs identified, will sign off   [name] 09/06/2016, 10:53 AM

## 2016-09-06 NOTE — H&P (Signed)
History and Physical    Margaret [name] NAT:[id number] DOB: [birth year] DOA: 09/05/2016  PCP: Penni Homans, MD  Patient coming from: Home.  Chief Complaint: Right hand weakness and numbness.  HPI: Margaret [name] is a 66 y.o. female with history of sleep apnea and hyperlipidemia started to experience right hand numbness and weakness at around 10 PM last night. The numbness lasted for a few seconds and resolved but the weakness lasted for almost one hour. Patient had decreased grip strength. Denies any difficulty abducting the right upper extremity or other extremities. Denies any difficulty swallowing speaking visual symptoms. Patient came to the ER. By then symptoms have largely resolved.   ED Course: CT of the head was unremarkable. EKG was showing normal sinus rhythm. Patient is being admitted for possible TIA. ER physician had discussed with on-call neurologist Dr. Leonel Ramsay.  Review of Systems: As per HPI, rest all negative.   Past Medical History:  Diagnosis Date  . Allergic state 03/25/2012  . Aortic calcification ([place]) 08/27/2014  . At risk for colon cancer    Last colonoscopy in [place] 03/2009 found hyperplastic polyp only  . Atherosclerosis of aorta ([place]) 04/12/2014  . GERD (gastroesophageal reflux disease)   . Hiatal hernia with gastroesophageal reflux 04/27/2010   Qualifier: Diagnosis of  By: Danelle Earthly CMA, Darlene  Failed Omeprazole and Protonix   . Hyperlipidemia   . Osteopenia 08/31/2012  . Peripheral edema 12/13/2014  . Sleep apnea   . Unspecified sinusitis (chronic) 03/30/2013    Past Surgical History:  Procedure Laterality Date  . [place] STUDY N/A 12/15/2015   Procedure: [place] STUDY;  Surgeon: Mauri Pole, MD;  Location: [facility];  Service: Endoscopy;  Laterality: N/A;  . DILATION AND CURETTAGE OF UTERUS    . ESOPHAGEAL MANOMETRY N/A 12/15/2015   Procedure: ESOPHAGEAL MANOMETRY (EM);  Surgeon: Mauri Pole, MD;  Location: [facility];   Service: Endoscopy;  Laterality: N/A;     reports that she has never smoked. She has never used smokeless tobacco. She reports that she drinks alcohol. She reports that she does not use drugs.  Allergies  Allergen Reactions  . No Known Allergies     Family History  Problem Relation Age of Onset  . Stroke Mother   . Lung cancer Father   . Stroke Sister     4  . Thyroid disease Sister   . Diabetes Sister   . Hypertension Sister   . Hyperlipidemia Sister   . Hypertension Sister   . Sleep apnea Son   . Obesity Son     Prior to Admission medications   Medication Sig Start Date End Date Taking? Authorizing Provider  fish oil-omega-3 fatty acids 1000 MG capsule Take 2 g by mouth daily.    Historical Provider, MD  fluticasone (FLONASE) 50 MCG/ACT nasal spray Place 2 sprays into both nostrils daily. 02/21/16   Percell Miller Saguier, PA-C  meloxicam (MOBIC) 15 MG tablet Take 1 tablet (15 mg total) by mouth daily. 08/17/16   Dene Gentry, MD  MULTIPLE VITAMINS PO Take by mouth daily.    Historical Provider, MD  nortriptyline (PAMELOR) 10 MG capsule Take 1 capsule (10 mg total) by mouth at bedtime. 01/11/16   Mauri Pole, MD  omeprazole (PRILOSEC) 20 MG capsule Take 1 capsule (20 mg total) by mouth daily as needed. 07/21/16   Mosie Lukes, MD  Probiotic Product (VSL#3) CAPS 1 capsule a day 11/15/15   Mauri Pole, MD  ranitidine (ZANTAC) 300 MG tablet TAKE 1 TABLET BY MOUTH AT BEDTIME 07/10/16   Mauri Pole, MD  rosuvastatin (CRESTOR) 10 MG tablet TAKE 1 TABLET BY MOUTH DAILY. 08/14/16   Mosie Lukes, MD    Physical Exam: Vitals:   09/06/16 0230 09/06/16 0336 09/06/16 0400 09/06/16 0411  BP: 127/63 (!) 155/59  (!) 159/71  Pulse: (!) 56 (!) 58  65  Resp: 10 16 16 18   Temp: 97.5 F (36.4 C) 97.9 F (36.6 C)  98.6 F (37 C)  TempSrc: Oral Oral  Oral  SpO2: 97% 97%  96%  Weight:  77.4 kg (170 lb 10.2 oz)    Height:  5\' 2"  (1.575 m)        Constitutional:  Moderately built and nourished. Vitals:   09/06/16 0230 09/06/16 0336 09/06/16 0400 09/06/16 0411  BP: 127/63 (!) 155/59  (!) 159/71  Pulse: (!) 56 (!) 58  65  Resp: 10 16 16 18   Temp: 97.5 F (36.4 C) 97.9 F (36.6 C)  98.6 F (37 C)  TempSrc: Oral Oral  Oral  SpO2: 97% 97%  96%  Weight:  77.4 kg (170 lb 10.2 oz)    Height:  5\' 2"  (1.575 m)     Eyes: Anicteric no pallor. ENMT: No discharge from the ears eyes nose unremarkable. Neck: No mass felt. No neck rigidity. No JVD appreciated. Respiratory: No rhonchi or crepitations. Cardiovascular: S1-S2 heard no murmurs appreciated. Abdomen: Soft nontender bowel sounds present. Musculoskeletal: No edema. No joint effusion. Skin: No rash. Skin appears warm. Neurologic: Alert awake oriented to time place and person. Moves all extremities 5 x 5. No facial asymmetry tongue is midline. Pupils are equal and reacting to light. Psychiatric: Appears normal. Normal affect.   Labs on Admission: I have personally reviewed following labs and imaging studies  CBC:  Recent Labs Lab 09/05/16 2314  WBC 6.1  NEUTROABS 2.4  HGB 13.5  HCT 39.8  MCV 86.5  PLT 517   Basic Metabolic Panel:  Recent Labs Lab 09/05/16 2314  NA 139  K 3.9  CL 105  CO2 27  GLUCOSE 106*  BUN 11  CREATININE 0.68  CALCIUM 9.5   GFR: Estimated Creatinine Clearance: 69.3 mL/min (by C-G formula based on SCr of 0.68 mg/dL). Liver Function Tests: No results for input(s): AST, ALT, ALKPHOS, BILITOT, PROT, ALBUMIN in the last 168 hours. No results for input(s): LIPASE, AMYLASE in the last 168 hours. No results for input(s): AMMONIA in the last 168 hours. Coagulation Profile: No results for input(s): INR, PROTIME in the last 168 hours. Cardiac Enzymes: No results for input(s): CKTOTAL, CKMB, CKMBINDEX, TROPONINI in the last 168 hours. BNP (last 3 results) No results for input(s): PROBNP in the last 8760 hours. HbA1C: No results for input(s): HGBA1C in the last  72 hours. CBG:  Recent Labs Lab 09/05/16 2353  GLUCAP 111*   Lipid Profile: No results for input(s): CHOL, HDL, LDLCALC, TRIG, CHOLHDL, LDLDIRECT in the last 72 hours. Thyroid Function Tests: No results for input(s): TSH, T4TOTAL, FREET4, T3FREE, THYROIDAB in the last 72 hours. Anemia Panel: No results for input(s): VITAMINB12, FOLATE, FERRITIN, TIBC, IRON, RETICCTPCT in the last 72 hours. Urine analysis:    Component Value Date/Time   COLORURINE YELLOW 09/05/2016 [place] 09/05/2016 2314   LABSPEC 1.007 09/05/2016 2314   PHURINE 7.5 09/05/2016 2314   GLUCOSEU NEGATIVE 09/05/2016 2314   GLUCOSEU NEGATIVE 12/04/2014 1532   HGBUR NEGATIVE 09/05/2016 2314  BILIRUBINUR NEGATIVE 09/05/2016 [place] 09/05/2016 2314   PROTEINUR NEGATIVE 09/05/2016 2314   UROBILINOGEN 0.2 12/04/2014 1532   NITRITE NEGATIVE 09/05/2016 2314   LEUKOCYTESUR NEGATIVE 09/05/2016 2314   Sepsis Labs: @LABRCNTIP (procalcitonin:4,lacticidven:4) )No results found for this or any previous visit (from the past 240 hour(s)).   Radiological Exams on Admission: Ct Head Wo Contrast  Result Date: 09/06/2016 CLINICAL DATA:  Right hand weakness for 2 hours EXAM: CT HEAD WITHOUT CONTRAST TECHNIQUE: Contiguous axial images were obtained from the base of the skull through the vertex without intravenous contrast. COMPARISON:  Head CT 02/23/2016 FINDINGS: Brain: No mass lesion, intraparenchymal hemorrhage or extra-axial collection. No evidence of acute cortical infarct. Brain parenchyma and CSF-containing spaces are normal for age. Vascular: No hyperdense vessel or unexpected calcification. Skull: Normal visualized skull base, calvarium and extracranial soft tissues. Sinuses/Orbits: Frothy secretions in the right maxillary sinus. Otherwise clear. Normal orbits. ASPECTS [facility] Stroke Program Early CT Score) - Ganglionic level infarction (caudate, lentiform nuclei, internal capsule, insula,  M1-M3 cortex): 7 - Supraganglionic infarction (M4-M6 cortex): 3 Total score (0-10 with 10 being normal): 10 IMPRESSION: 1. Normal head CT. 2. ASPECTS is 10. These results were called by telephone at the time of interpretation on 09/06/2016 at 12:50 am to Dr. Shanon Rosser , who verbally acknowledged these results. Electronically Signed   By: Ulyses Jarred M.D.   On: 09/06/2016 00:51    EKG: Independently reviewed. Normal sinus rhythm.  Assessment/Plan Principal Problem:   TIA (transient ischemic attack) Active Problems:   Hyperlipidemia, mixed   Sleep apnea    1. TIA - I have discussed with on-call neurologist Dr. Leonel Ramsay. At this time we will observe patient for further workup for TIA. MRI/MRA brain 2-D echo carotid Doppler and aspirin has been ordered. Check hemoglobin A1c and lipid panel. 2. Elevated blood pressure - closely follow blood pressure trends. Allow for permissive hypertension for now. 3. Hyperlipidemia on statins. 4. Sleep apnea on CPAP.   DVT prophylaxis: Lovenox. Code Status: Full code.  Family Communication: Discussed with patient.  Disposition Plan: Home.  Consults called: Neurology.  Admission status: Observation.    Rise Patience MD [facility] Pager [phone].  If 7PM-7AM, please contact night-coverage [email or web address] Password [facility]  09/06/2016, 5:34 AM

## 2016-09-06 NOTE — Evaluation (Signed)
Occupational Therapy Evaluation Patient Details Name: Margaret [name] MRN: [id number] DOB: [birth year] Today's Date: 09/06/2016    History of Present Illness Pt is a 66 y.o. female with a history of hyperlipidemia who presented to ED on 09/05/16 with transient right hand weakness. She felt like she may have had some numbness, but it was not a prominent part of her complaint. Her major difficulty was that she could not use her hand as she typically does. She denies any involvement of her face. CT was negative. MRI pending.    Clinical Impression   PTA Pt independent in ADL/IADL and mobility. Pt reports she is seeing a PT twice a week because of back issues. Pt currently independent in all ADL/functional transfers. Please see below report for full details. Pt reports that her Right hand is back to normal and she does not have trouble holding toothbrush, utensils, or problems with it functionally. OT to sign off, thank you for this referral.    Follow Up Recommendations  No OT follow up    Equipment Recommendations  None recommended by OT    Recommendations for Other Services       Precautions / Restrictions Restrictions Weight Bearing Restrictions: No      Mobility Bed Mobility Overal bed mobility: Independent                Transfers Overall transfer level: Independent Equipment used: None                  Balance Overall balance assessment: No apparent balance deficits (not formally assessed)                                         ADL either performed or assessed with clinical judgement   ADL Overall ADL's : Independent                                       General ADL Comments: Pt eating breakfast with no problems when OT entered the room. Pt able to problem solve and read from menu, complete toilet transfer and peri care, sort through grooming items at sink, perform sink level grooming, tub transfer and 3 stairs without the  use of hand rails.      Vision Baseline Vision/History: Wears glasses Wears Glasses: Reading only Patient Visual Report: No change from baseline Vision Assessment?: No apparent visual deficits Additional Comments: Able to read from menu and functionally navigate environment including reading room numbers as we walked in the hall     Perception     Praxis      Pertinent Vitals/Pain Pain Assessment: No/denies pain     Hand Dominance Right   Extremity/Trunk Assessment Upper Extremity Assessment Upper Extremity Assessment: Overall WFL for tasks assessed   Lower Extremity Assessment Lower Extremity Assessment: Overall WFL for tasks assessed   Cervical / Trunk Assessment Cervical / Trunk Assessment: Other exceptions Cervical / Trunk Exceptions: rounded shoulders and forward head   Communication Communication Communication: No difficulties   Cognition Arousal/Alertness: Awake/alert Behavior During Therapy: WFL for tasks assessed/performed Overall Cognitive Status: Within Functional Limits for tasks assessed  General Comments  Pt able to carryon conversation while walking in the hall on the way to gym to demonstrate tub transfers with no LOB. Pt denies any difficulty holding onto eating utensils, toothbrush, or items with RUE. 5/5 grasp strength and Pt able to hold arms out in forward flexion without problem for 10 seconds (eyes closed)    Exercises     Shoulder Instructions      Home Living Family/patient expects to be discharged to:: Private residence Living Arrangements: Spouse/significant other;Alone (Husband is out of town) Available Help at Discharge: Friend(s);Available PRN/intermittently Type of Home: House Home Access: Stairs to enter Entrance Stairs-Number of Steps: 2 Entrance Stairs-Rails: Right Home Layout: Two level Alternate Level Stairs-Number of Steps: flight Alternate Level Stairs-Rails:  Right Bathroom Shower/Tub: Tub/shower unit;Curtain   Bathroom Toilet: Standard Bathroom Accessibility: Yes How Accessible: Accessible via walker Home Equipment: Hand held shower head          Prior Functioning/Environment Level of Independence: Independent        Comments: driving, doing all ADL and  IADL        OT Problem List: Impaired UE functional use      OT Treatment/Interventions:      OT Goals(Current goals can be found in the care plan section) Acute Rehab OT Goals Patient Stated Goal: to get home OT Goal Formulation: With patient Time For Goal Achievement: 09/13/16 Potential to Achieve Goals: Good  OT Frequency:     Barriers to D/C:            Co-evaluation              End of Session Equipment Utilized During Treatment: Gait belt Nurse Communication: Mobility status (indpendence of Pt during session)  Activity Tolerance: Patient tolerated treatment well Patient left: in chair;with call bell/phone within reach  OT Visit Diagnosis: Other symptoms and signs involving the nervous system (R29.898)                Time: 4401-0272 OT Time Calculation (min): 48 min Charges:  OT General Charges $OT Visit: 1 Procedure OT Evaluation $OT Eval Low Complexity: 1 Procedure OT Treatments $Self Care/Home Management : 8-22 mins $Therapeutic Activity: 8-22 mins G-Codes: OT G-codes **NOT FOR INPATIENT CLASS** Functional Assessment Tool Used: AM-PAC 6 Clicks Daily Activity Functional Limitation: Self care Self Care Current Status (Z3664): 0 percent impaired, limited or restricted Self Care Goal Status (Q0347): 0 percent impaired, limited or restricted Self Care Discharge Status (Q2595): 0 percent impaired, limited or restricted   Hulda Humphrey OTR/L [place] 09/06/2016, 10:03 AM

## 2016-09-06 NOTE — ED Notes (Signed)
Pt verbalizes relief of pain to epigastric area

## 2016-09-06 NOTE — Progress Notes (Signed)
Patient was seen and examined at bedside. Please see H&P from 09/06/2016 for detail.  Briefly, 66 year old female with dyslipidemia presented with right hand numbness and weakness. CT scan of head was unremarkable. Patient's symptoms are likely due to TIA. Weakness and numbness resolved. MRI of the brain and MRA unremarkable. Follow-up neurologist. Pending carotid Doppler ultrasound and echocardiogram Ordered MRI of cervical spine by neurologist LDL level 80 A1c level pending  BP 127/59, HR 71 NAD, sitting on bed comfortable Clear to auscultation bilateral lungs Regular rate rhythm, S1-S2 normal Muscle strength 5 over 5 in all extremities, no focal neurological exam No lower extremity edema  Continue aspirin, Crestor, lovaza. DVT prophylaxis with Lovenox subcutaneous  Discussed with the stroke neurologist.

## 2016-09-06 NOTE — Consult Note (Signed)
Neurology Consultation Reason for Consult: Right hand weakness Referring Physician: Hal Hope, A  CC: Right hand weakness  History is obtained from: Patient  HPI: Margaret [name] is a 66 y.o. female with a history of hyperlipidemia who presents with transient right hand weakness. She felt like she may have had some numbness, but it was not a prominent part of her complaint. Her major difficulty was that she could not use her hand as she typically does. She denies any involvement of her face.  She denies any abnormal arm positioning prior to this starting.   LKW: 10 PM tpa given?: no, rapidly improving symptoms    ROS: A 14 point ROS was performed and is negative except as noted in the HPI.   Past Medical History:  Diagnosis Date  . Allergic state 03/25/2012  . Aortic calcification ([place]) 08/27/2014  . At risk for colon cancer    Last colonoscopy in [place] 03/2009 found hyperplastic polyp only  . Atherosclerosis of aorta ([place]) 04/12/2014  . GERD (gastroesophageal reflux disease)   . Hiatal hernia with gastroesophageal reflux 04/27/2010   Qualifier: Diagnosis of  By: Danelle Earthly CMA, Darlene  Failed Omeprazole and Protonix   . Hyperlipidemia   . Osteopenia 08/31/2012  . Peripheral edema 12/13/2014  . Sleep apnea   . Unspecified sinusitis (chronic) 03/30/2013     Family History  Problem Relation Age of Onset  . Stroke Mother   . Lung cancer Father   . Stroke Sister     71  . Thyroid disease Sister   . Diabetes Sister   . Hypertension Sister   . Hyperlipidemia Sister   . Hypertension Sister   . Sleep apnea Son   . Obesity Son      Social History:  reports that she has never smoked. She has never used smokeless tobacco. She reports that she drinks alcohol. She reports that she does not use drugs.   Exam: Current vital signs: BP (!) 159/71 (BP Location: Left Leg)   Pulse 65   Temp 98.6 F (37 C) (Oral)   Resp 18   Ht 5\' 2"  (1.575 m)   Wt 77.4 kg (170 lb 10.2 oz)   SpO2  96%   BMI 31.21 kg/m  Vital signs in last 24 hours: Temp:  [97.5 F (36.4 C)-99 F (37.2 C)] 98.6 F (37 C) (04/11 0411) Pulse Rate:  [55-65] 65 (04/11 0411) Resp:  [10-19] 18 (04/11 0411) BP: (127-182)/(59-80) 159/71 (04/11 0411) SpO2:  [96 %-100 %] 96 % (04/11 0411) Weight:  [77.1 kg (170 lb)-77.4 kg (170 lb 10.2 oz)] 77.4 kg (170 lb 10.2 oz) (04/11 0336)   Physical Exam  Constitutional: Appears well-developed and well-nourished.  Psych: Affect appropriate to situation Eyes: No scleral injection HENT: No OP obstrucion Head: Normocephalic.  Cardiovascular: Normal rate and regular rhythm.  Respiratory: Effort normal and breath sounds normal to anterior ascultation GI: Soft.  No distension. There is no tenderness.  Skin: WDI  Neuro: Mental Status: Patient is awake, alert, oriented to person, place, month, year, and situation. Patient is able to give a clear and coherent history. No signs of aphasia or neglect Cranial Nerves: II: Visual Fields are full. Pupils are equal, round, and reactive to light.   III,IV, VI: EOMI without ptosis or diploplia.  V: Facial sensation is symmetric to temperature VII: Facial movement is symmetric.  VIII: hearing is intact to voice X: Uvula elevates symmetrically XI: Shoulder shrug is symmetric. XII: tongue is midline without atrophy or fasciculations.  Motor: Tone is normal. Bulk is normal. 5/5 strength was present in all four extremities. ? Mild decreased grip on the right, but APB is intact Sensory: Sensation is symmetric to light touch and temperature in the arms and legs. Deep Tendon Reflexes: 2+ and symmetric in the biceps and patellae.  Plantars: Toes are downgoing bilaterally.  Cerebellar: FNF and HKS are intact bilaterally   I have reviewed labs in epic and the results pertinent to this consultation are: BMP - unremarkable  I have reviewed the images obtained:CT head- negative.   Impression: 66 yo F with transient right  hand weakness. Without being able to examine her during the weakness, difficult to be sure, but from description it sounds like it crossed dermatomal and peripheral nerve distributions making a central cause(e.g. TIA) more likely.   With a history of severe GERD, would favor using plavix rather than ASA  Recommendations: 1. HgbA1c, fasting lipid panel 2. MRI, MRA  of the brain without contrast 3. Frequent neuro checks 4. Echocardiogram 5. Carotid dopplers 6. Prophylactic therapy-Antiplatelet med: Plavix 75mg  daily 7. Risk factor modification 8. Telemetry monitoring 9. PT consult, OT consult, Speech consult 10. please page stroke NP  Or  PA  Or MD  from 8am -4 pm as this patient will be followed by the stroke team at this point.   You can look them up on [email or web address]      Roland Rack, MD [facility] [phone]  If 7pm- 7am, please page neurology on call as listed in [place].

## 2016-09-06 NOTE — Progress Notes (Addendum)
STROKE TEAM PROGRESS NOTE   HISTORY OF PRESENT ILLNESS (per record) Margaret [name] is a 66 y.o. female with a history of hyperlipidemia who presents with transient right hand weakness. She felt like she may have had some numbness, but it was not a prominent part of her complaint. Her major difficulty was that she could not use her hand as she typically does. She denies any involvement of her face. She denies any abnormal arm positioning prior to this starting. She was LKW at 10 PM 09/05/2016. Patient was not administered IV t-PA secondary to rapidly improving symptoms. She was admitted for further evaluation and treatment.   SUBJECTIVE (INTERVAL HISTORY) No family is at bedside. Pt stated that her right hand clumsiness has resolved this am. She denies any leg or facial symptoms. She had LBP on PT and sometimes has neck pain and HA, but denies HA or neck pain at this time.    OBJECTIVE Temp:  [97.5 F (36.4 C)-99 F (37.2 C)] 97.8 F (36.6 C) (04/11 0948) Pulse Rate:  [53-67] 67 (04/11 0948) Cardiac Rhythm: Normal sinus rhythm (04/11 0914) Resp:  [10-19] 16 (04/11 0948) BP: (107-182)/(53-80) 107/57 (04/11 0948) SpO2:  [96 %-100 %] 97 % (04/11 0948) Weight:  [77.1 kg (170 lb)-77.4 kg (170 lb 10.2 oz)] 77.4 kg (170 lb 10.2 oz) (04/11 0336)  CBC:  Recent Labs Lab 09/05/16 2314 09/06/16 0606  WBC 6.1 6.1  NEUTROABS 2.4  --   HGB 13.5 13.1  HCT 39.8 39.6  MCV 86.5 86.1  PLT 213 098    Basic Metabolic Panel:  Recent Labs Lab 09/05/16 2314 09/06/16 0606  NA 139 142  K 3.9 3.8  CL 105 104  CO2 27 29  GLUCOSE 106* 108*  BUN 11 8  CREATININE 0.68 0.64  CALCIUM 9.5 9.5    Lipid Panel:    Component Value Date/Time   CHOL 150 09/06/2016 0606   TRIG 106 09/06/2016 0606   HDL 49 09/06/2016 0606   CHOLHDL 3.1 09/06/2016 0606   VLDL 21 09/06/2016 0606   LDLCALC 80 09/06/2016 0606   HgbA1c:  Lab Results  Component Value Date   HGBA1C 6.2 08/30/2015   Urine Drug Screen: No  results found for: LABOPIA, COCAINSCRNUR, LABBENZ, AMPHETMU, THCU, LABBARB  Alcohol Level No results found for: [place] I have personally reviewed the radiological images below and agree with the radiology interpretations.  Ct Head Wo Contrast 09/06/2016 1. Normal head CT. 2. ASPECTS is 10.   Mr Brain Wo Contrast 09/06/2016 1. Negative brain MRI.  No acute finding, including infarct.   Mr Jodene Nam Head/brain Wo Cm 09/06/2016 2. Atherosclerotic change along the carotid siphons. No flow limiting stenosis or acute arterial finding. 3. 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   CUS - Patent carotid system without evidence of stenosis. Vertebral arteries demonstrated antegrade flow.   TTE pending  MRI C-spine pending   PHYSICAL EXAM  Temp:  [97.5 F (36.4 C)-99 F (37.2 C)] 98.1 F (36.7 C) (04/11 1342) Pulse Rate:  [53-71] 71 (04/11 1342) Resp:  [10-19] 16 (04/11 1414) BP: (107-182)/(53-80) 113/64 (04/11 1414) SpO2:  [96 %-100 %] 98 % (04/11 1414) Weight:  [170 lb (77.1 kg)-170 lb 10.2 oz (77.4 kg)] 170 lb 10.2 oz (77.4 kg) (04/11 0336)  General - Well nourished, well developed, in no apparent distress.  Ophthalmologic - Sharp disc margins OU.   Cardiovascular - Regular rate and rhythm.  Mental Status -  Level of arousal  and orientation to time, place, and person were intact. Language including expression, naming, repetition, comprehension was assessed and found intact. Fund of Knowledge was assessed and was intact.  Cranial Nerves II - XII - II - Visual field intact OU. III, IV, VI - Extraocular movements intact. V - Facial sensation intact bilaterally. VII - Facial movement intact bilaterally. VIII - Hearing & vestibular intact bilaterally. X - Palate elevates symmetrically. XI - Chin turning & shoulder shrug intact bilaterally. XII - Tongue protrusion intact.  Motor Strength - The patient's strength was normal in all extremities and pronator  drift was absent.  Bulk was normal and fasciculations were absent.   Motor Tone - Muscle tone was assessed at the neck and appendages and was normal.  Reflexes - The patient's reflexes were 1+ in all extremities and she had no pathological reflexes.  Sensory - Light touch, temperature/pinprick, vibration and proprioception, and Romberg testing were assessed and were symmetrical.    Coordination - The patient had normal movements in the hands and feet with no ataxia or dysmetria.  Tremor was absent.  Gait and Station - The patient's transfers, posture, gait, station, and turns were observed as normal.   ASSESSMENT/PLAN [name] is a 66 y.o. female with history of OSA nad HLD presenting with R hand numbness and weakness. She did not receive IV t-PA due to rapidly resolving symptoms.   Transient right hand clumsiness - could be TIA but pt lack of significant stroke risk factors. Need to rule out cervical radiculopathy.  Code stroke CT normal. Aspects 10  MRI  Unremarkable. No acute stroke  MRA  Carotid atherosclerosis. 1-39mm R hypophyseal aneurysm vs infundibulum  Carotid Doppler  unremarkable  2D Echo  Pending  MRI C-spine pending  LDL 80  HgbA1c pending  Lovenox 40 mg sq daily for VTE prophylaxis  Diet Heart Room service appropriate? Yes; Fluid consistency: Thin  No antithrombotic prior to admission, now on aspirin 325 mg daily.   Patient counseled to be compliant with her antithrombotic medications  Ongoing aggressive stroke risk factor management  Therapy recommendations:  No therapy needs  Disposition:  Return home  Hyperlipidemia  Home meds:  crestor 10, resumed in hospital  LDL 80, goal < 70  Continue statin at discharge  Other Stroke Risk Factors  ETOH use, advised to drink no more than 1 drink(s) a day  Obesity, Body mass index is 31.21 kg/m., recommend weight loss, diet and exercise as appropriate   Family hx stroke (mother,  sister)  Obstructive sleep apnea, on CPAP at home  Other active problems  LBP  Neck pain and HA  Hospital day # 0  Rosalin Hawking, MD PhD Stroke Neurology 09/06/2016 5:38 PM  To contact Stroke Continuity provider, please refer to [email or web address] After hours, contact General Neurology

## 2016-09-06 NOTE — Progress Notes (Signed)
PT Cancellation Note  Patient Details Name: Margaret [name] MRN: [id number] DOB: Nov 03, 1950   Cancelled Treatment:    Reason Eval/Treat Not Completed: PT screened, no needs identified, will sign off. Discussed pt case with OT who states that pt is independent with mobility at this time. Pt completed stair training with OT during session as well. If needs change, please reconsult.    [name] 09/06/2016, 10:58 AM   [name], PT, DPT Acute Rehabilitation Services Pager: [phone]

## 2016-09-07 DIAGNOSIS — M5412 Radiculopathy, cervical region: Secondary | ICD-10-CM

## 2016-09-07 DIAGNOSIS — G451 Carotid artery syndrome (hemispheric): Secondary | ICD-10-CM | POA: Diagnosis not present

## 2016-09-07 DIAGNOSIS — E782 Mixed hyperlipidemia: Secondary | ICD-10-CM | POA: Diagnosis not present

## 2016-09-07 LAB — HEMOGLOBIN A1C
Hgb A1c MFr Bld: 6 % — ABNORMAL HIGH (ref 4.8–5.6)
MEAN PLASMA GLUCOSE: 126 mg/dL

## 2016-09-07 MED ORDER — ASPIRIN 325 MG PO TBEC: 325.0000 mg | DELAYED_RELEASE_TABLET | Freq: Every day | ORAL | 0 refills | Status: DC

## 2016-09-07 NOTE — Progress Notes (Signed)
STROKE TEAM PROGRESS NOTE   SUBJECTIVE (INTERVAL HISTORY) No family is at bedside. Pt at baseline. MRI C-spine showed cervical disc herniation, with cord flattening but no cord compression. Her transient symptoms could be due to radiculopathy. She has PT working with her for LBP and we will ask PT to work with her for cervical radiculopathy too.    OBJECTIVE Temp:  [97.8 F (36.6 C)-98.2 F (36.8 C)] 98.2 F (36.8 C) (04/12 1050) Pulse Rate:  [56-88] 88 (04/12 1050) Cardiac Rhythm: Sinus bradycardia (04/12 0700) Resp:  [16-17] 16 (04/12 0100) BP: (113-133)/(51-64) 116/57 (04/12 1050) SpO2:  [96 %-99 %] 96 % (04/12 1050)  CBC:   Recent Labs Lab 09/05/16 2314 09/06/16 0606  WBC 6.1 6.1  NEUTROABS 2.4  --   HGB 13.5 13.1  HCT 39.8 39.6  MCV 86.5 86.1  PLT 213 254    Basic Metabolic Panel:   Recent Labs Lab 09/05/16 2314 09/06/16 0606  NA 139 142  K 3.9 3.8  CL 105 104  CO2 27 29  GLUCOSE 106* 108*  BUN 11 8  CREATININE 0.68 0.64  CALCIUM 9.5 9.5    Lipid Panel:     Component Value Date/Time   CHOL 150 09/06/2016 0606   TRIG 106 09/06/2016 0606   HDL 49 09/06/2016 0606   CHOLHDL 3.1 09/06/2016 0606   VLDL 21 09/06/2016 0606   LDLCALC 80 09/06/2016 0606   HgbA1c:  Lab Results  Component Value Date   HGBA1C 6.0 (H) 09/06/2016   Urine Drug Screen: No results found for: LABOPIA, COCAINSCRNUR, LABBENZ, AMPHETMU, THCU, LABBARB  Alcohol Level No results found for: [place] I have personally reviewed the radiological images below and agree with the radiology interpretations.  Ct Head Wo Contrast 09/06/2016 1. Normal head CT. 2. ASPECTS is 10.   Mr Brain Wo Contrast 09/06/2016 1. Negative brain MRI.  No acute finding, including infarct.   Mr Jodene Nam Head/brain Wo Cm 09/06/2016 2. Atherosclerotic change along the carotid siphons. No flow limiting stenosis or acute arterial finding. 3. 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   CUS -  Patent carotid system without evidence of stenosis. Vertebral arteries demonstrated antegrade flow.   TTE - Left ventricle: The cavity size was normal. There was mild focal   basal hypertrophy of the septum. Systolic function was normal.   The estimated ejection fraction was in the range of 60% to 65%.   Wall motion was normal; there were no regional wall motion   abnormalities. Doppler parameters are consistent with abnormal   left ventricular relaxation (grade 1 diastolic dysfunction).   Doppler parameters are consistent with indeterminate ventricular   filling pressure. - Aortic valve: Transvalvular velocity was within the normal range.   There was no stenosis. There was no regurgitation. Valve area   (VTI): 1.9 cm^2. Valve area (Vmax): 1.77 cm^2. Valve area   (Vmean): 1.83 cm^2. - Mitral valve: Transvalvular velocity was within the normal range.   There was no evidence for stenosis. There was no regurgitation. - Right ventricle: The cavity size was normal. Wall thickness was   normal. Systolic function was normal. - Atrial septum: No defect or patent foramen ovale was identified   by color flow Doppler. - Tricuspid valve: There was trivial regurgitation. - Pulmonary arteries: Systolic pressure was within the normal   range. PA peak pressure: 16 mm Hg (S). - Global longitudinal strain -21.4%.  Mr Cervical Spine Wo Contrast 09/07/2016 IMPRESSION: 1. Multilevel degenerative spondylolysis  as detailed above with resultant mild canal stenosis at C4-5 and C5-6. 2. Resultant multilevel bilateral foraminal narrowing. Most notable findings include moderate right C3 foraminal stenosis, severe right with moderate left C4 foraminal narrowing, moderate right C5 foraminal stenosis, and severe left C6 foraminal narrowing. 3. 5.0 x 2.5 cm ovoid mass within the subcutaneous fat of the left upper back, incompletely visualized. Further evaluation with dedicated MRI, with and without contrast, suggested for  complete evaluation (MRI neck and/or chest with attention to include this lesion could be performed).     PHYSICAL EXAM  Temp:  [97.8 F (36.6 C)-98.2 F (36.8 C)] 98.2 F (36.8 C) (04/12 1050) Pulse Rate:  [56-88] 88 (04/12 1050) Resp:  [16-17] 16 (04/12 0100) BP: (113-133)/(51-64) 116/57 (04/12 1050) SpO2:  [96 %-99 %] 96 % (04/12 1050)  General - Well nourished, well developed, in no apparent distress.  Ophthalmologic - Sharp disc margins OU.   Cardiovascular - Regular rate and rhythm.  Mental Status -  Level of arousal and orientation to time, place, and person were intact. Language including expression, naming, repetition, comprehension was assessed and found intact. Fund of Knowledge was assessed and was intact.  Cranial Nerves II - XII - II - Visual field intact OU. III, IV, VI - Extraocular movements intact. V - Facial sensation intact bilaterally. VII - Facial movement intact bilaterally. VIII - Hearing & vestibular intact bilaterally. X - Palate elevates symmetrically. XI - Chin turning & shoulder shrug intact bilaterally. XII - Tongue protrusion intact.  Motor Strength - The patient's strength was normal in all extremities and pronator drift was absent.  Bulk was normal and fasciculations were absent.   Motor Tone - Muscle tone was assessed at the neck and appendages and was normal.  Reflexes - The patient's reflexes were 1+ in all extremities and she had no pathological reflexes.  Sensory - Light touch, temperature/pinprick, vibration and proprioception, and Romberg testing were assessed and were symmetrical.    Coordination - The patient had normal movements in the hands and feet with no ataxia or dysmetria.  Tremor was absent.  Gait and Station - The patient's transfers, posture, gait, station, and turns were observed as normal.   ASSESSMENT/PLAN Margaret [name] is a 66 y.o. female with history of OSA nad HLD presenting with R hand numbness and  weakness. She did not receive IV t-PA due to rapidly resolving symptoms.   Transient right hand clumsiness - TIA vs. Cervical radiculopathy  Code stroke CT normal. Aspects 10  MRI  Unremarkable. No acute stroke  MRA  Carotid atherosclerosis. 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 Foraminal stenosis  Will do EMG/NCS as outpt to evaluate radiculopathy  LDL 80  HgbA1c 6.0  Lovenox 40 mg sq daily for VTE prophylaxis Diet Heart Room service appropriate? Yes; Fluid consistency: Thin  No antithrombotic prior to admission, now on aspirin 325 mg daily. Continue ASA on discharge.  Patient counseled to be compliant with her antithrombotic medications  Ongoing aggressive stroke risk factor management  Therapy recommendations:  Recommend to continue work with outpt PT for LBP but also for cervical radiculopathy  Disposition:  Return home  Hyperlipidemia  Home meds:  crestor 10, resumed in hospital  LDL 80, goal < 70  Continue statin at discharge  Other Stroke Risk Factors  ETOH use, advised to drink no more than  1 drink(s) a day  Obesity, Body mass index is 31.21 kg/m., recommend weight loss, diet and exercise as appropriate   Family hx stroke (mother, sister)  Obstructive sleep apnea, on CPAP at home  Other active problems  LBP  Neck pain and HA  Hospital day # 0   Neurology will sign off. Please call with questions. Pt will follow up with Cecille Rubin NP at [facility] in about 6 weeks. Thanks for the consult.   Rosalin Hawking, MD PhD Stroke Neurology 09/07/2016 1:48 PM  To contact Stroke Continuity provider, please refer to [email or web address] After hours, contact General Neurology

## 2016-09-07 NOTE — Discharge Summary (Signed)
Physician Discharge Summary  Margaret [name] RJJ:[id number] DOB: [birth year] DOA: 09/05/2016  PCP: Penni Homans, MD  Admit date: 09/05/2016 Discharge date: 09/07/2016  Admitted From:home Disposition:home  Recommendations for Outpatient Follow-up:  1. Follow up with PCP in 1-2 weeks 2. Please obtain BMP/CBC in one week  Home Health:no Equipment/Devices:no Discharge Condition:stable CODE STATUS:full Diet recommendation:heart healthy  Brief/Interim Summary: 66 year old female with dyslipidemia presented with right hand numbness and weakness. CT scan of head was unremarkable. Patient's symptoms are likely due to TIA vs radiculopathy. Weakness and numbness resolved. Patient was evaluated by neurologist. MRI of the brain and MRA unremarkable. Carotid Doppler with no significant stenosis. Echocardiogram with EF of 60-65% with no wall motion abnormalities. MRI of cervical spine was obtained which showed cervical disc herniation but no cord compression. I discussed with neurologist Dr. Erlinda Hong. Patient's symptoms likely contributed by radical opacity. Patient follows up with physical therapy outpatient. Recommended to continue to follow-up with the physical therapy for her cervical radiculopathy too. Patient verbalized understanding. Neurologist is planning for EMG test as an outpatient. Recommend to follow-up at outpatient. On aspirin, statin. Patient is clinically improved. Verbalized understanding of follow-up instruction.  Discharge Diagnoses:  Principal Problem:   TIA (transient ischemic attack) Active Problems:   Hyperlipidemia, mixed   Sleep apnea   Right hand weakness    Discharge Instructions  Discharge Instructions    Call MD for:  difficulty breathing, headache or visual disturbances    Complete by:  As directed    Call MD for:  extreme fatigue    Complete by:  As directed    Call MD for:  hives    Complete by:  As directed    Call MD for:  persistant dizziness or  light-headedness    Complete by:  As directed    Call MD for:  persistant nausea and vomiting    Complete by:  As directed    Call MD for:  severe uncontrolled pain    Complete by:  As directed    Call MD for:  temperature >100.4    Complete by:  As directed    Diet - low sodium heart healthy    Complete by:  As directed    Discharge instructions    Complete by:  As directed    Please follow up with PCP and neurologist, Please continue outpatient PT treatment.   Increase activity slowly    Complete by:  As directed      Allergies as of 09/07/2016   No Known Allergies     Medication List    STOP taking these medications   ranitidine 300 MG tablet Commonly known as:  ZANTAC     TAKE these medications   aspirin 325 MG EC tablet Take 1 tablet (325 mg total) by mouth daily. Start taking on:  09/08/2016   fish oil-omega-3 fatty acids 1000 MG capsule Take 2 g by mouth daily.   multivitamin with minerals Tabs tablet Take 1 tablet by mouth daily.   omeprazole 20 MG capsule Commonly known as:  PRILOSEC Take 1 capsule (20 mg total) by mouth daily as needed.   rosuvastatin 10 MG tablet Commonly known as:  CRESTOR TAKE 1 TABLET BY MOUTH DAILY.      Follow-up Information    Dennie Bible, NP. Schedule an appointment as soon as possible for a visit in 6 week(s).   Specialty:  Family Medicine Contact information: [address] [place] [address] [phone]  Penni Homans, MD. Schedule an appointment as soon as possible for a visit in 1 week(s).   Specialty:  Family Medicine Contact information: [address] [place] [address] [phone]          No Known Allergies  Consultations: Neurologist  Procedures/Studies: MRI and MRA, carotid ultrasound, echocardiogram  Subjective: Patient was seen and examined at bedside. Reported doing well. Denied weakness or numbness. No headaches dizziness. Denied chest pain  or shortness of breath.  Discharge Exam: Vitals:   09/07/16 0100 09/07/16 1050  BP: (!) 122/59 (!) 116/57  Pulse: 63 88  Resp: 16   Temp: 98.2 F (36.8 C) 98.2 F (36.8 C)   Vitals:   09/06/16 1836 09/06/16 2100 09/07/16 0100 09/07/16 1050  BP: (!) 125/56 (!) 121/51 (!) 122/59 (!) 116/57  Pulse: 87 (!) 56 63 88  Resp: 16 17 16    Temp: 98.2 F (36.8 C) 97.8 F (36.6 C) 98.2 F (36.8 C) 98.2 F (36.8 C)  TempSrc: Oral Oral Oral Oral  SpO2: 96% 99% 98% 96%  Weight:      Height:        General: Pt is alert, awake, not in acute distress Cardiovascular: RRR, S1/S2 +, no rubs, no gallops Respiratory: CTA bilaterally, no wheezing, no rhonchi Abdominal: Soft, NT, ND, bowel sounds + Extremities: no edema, no cyanosis    The results of significant diagnostics from this hospitalization (including imaging, microbiology, ancillary and laboratory) are listed below for reference.     Microbiology: No results found for this or any previous visit (from the past 240 hour(s)).   Labs: BNP (last 3 results) No results for input(s): BNP in the last 8760 hours. Basic Metabolic Panel:  Recent Labs Lab 09/05/16 2314 09/06/16 0606  NA 139 142  K 3.9 3.8  CL 105 104  CO2 27 29  GLUCOSE 106* 108*  BUN 11 8  CREATININE 0.68 0.64  CALCIUM 9.5 9.5   Liver Function Tests:  Recent Labs Lab 09/06/16 0606  AST 23  ALT 31  ALKPHOS 72  BILITOT 0.6  PROT 6.5  ALBUMIN 3.8   No results for input(s): LIPASE, AMYLASE in the last 168 hours. No results for input(s): AMMONIA in the last 168 hours. CBC:  Recent Labs Lab 09/05/16 2314 09/06/16 0606  WBC 6.1 6.1  NEUTROABS 2.4  --   HGB 13.5 13.1  HCT 39.8 39.6  MCV 86.5 86.1  PLT 213 205   Cardiac Enzymes: No results for input(s): CKTOTAL, CKMB, CKMBINDEX, TROPONINI in the last 168 hours. BNP: Invalid input(s): POCBNP CBG:  Recent Labs Lab 09/05/16 2353  GLUCAP 111*   D-Dimer No results for input(s): DDIMER in the  last 72 hours. Hgb A1c  Recent Labs  09/06/16 0606  HGBA1C 6.0*   Lipid Profile  Recent Labs  09/06/16 0606  CHOL 150  HDL 49  LDLCALC 80  TRIG 106  CHOLHDL 3.1   Thyroid function studies No results for input(s): TSH, T4TOTAL, T3FREE, THYROIDAB in the last 72 hours.  Invalid input(s): FREET3 Anemia work up No results for input(s): VITAMINB12, FOLATE, FERRITIN, TIBC, IRON, RETICCTPCT in the last 72 hours. Urinalysis    Component Value Date/Time   COLORURINE YELLOW 09/05/2016 [place] 09/05/2016 2314   LABSPEC 1.007 09/05/2016 2314   PHURINE 7.5 09/05/2016 2314   GLUCOSEU NEGATIVE 09/05/2016 2314   GLUCOSEU NEGATIVE 12/04/2014 1532   HGBUR NEGATIVE 09/05/2016 2314   BILIRUBINUR NEGATIVE 09/05/2016 2314  KETONESUR NEGATIVE 09/05/2016 2314   PROTEINUR NEGATIVE 09/05/2016 2314   UROBILINOGEN 0.2 12/04/2014 1532   NITRITE NEGATIVE 09/05/2016 2314   LEUKOCYTESUR NEGATIVE 09/05/2016 2314   Sepsis Labs Invalid input(s): PROCALCITONIN,  WBC,  LACTICIDVEN Microbiology No results found for this or any previous visit (from the past 240 hour(s)).   Time coordinating discharge: 26 minutes  SIGNED:   Rosita Fire, MD  [facility] 09/07/2016, 2:22 PM  If 7PM-7AM, please contact night-coverage [email or web address] Password [email or web address]

## 2016-09-07 NOTE — Progress Notes (Signed)
Discharge instructions reviewed with patient/family. All questions answered at this time. Transport home by family.   Arley Salamone, RN 

## 2016-09-08 ENCOUNTER — Ambulatory Visit: Payer: BLUE CROSS/BLUE SHIELD | Admitting: Physical Therapy

## 2016-09-08 ENCOUNTER — Telehealth: Payer: Self-pay | Admitting: Behavioral Health

## 2016-09-08 NOTE — Telephone Encounter (Signed)
Patient voiced that she's "feeling better and happy that they ruled out stroke". Appointment for hospital follow-up has been scheduled for 09/12/16 at 11:15 AM with PCP.

## 2016-09-11 ENCOUNTER — Ambulatory Visit: Payer: BLUE CROSS/BLUE SHIELD

## 2016-09-11 DIAGNOSIS — M6281 Muscle weakness (generalized): Secondary | ICD-10-CM

## 2016-09-11 DIAGNOSIS — M545 Low back pain: Secondary | ICD-10-CM

## 2016-09-11 DIAGNOSIS — R293 Abnormal posture: Secondary | ICD-10-CM

## 2016-09-11 NOTE — Therapy (Signed)
[facility] [address]  [place], [place], [address] Phone: [phone]   Fax:  [phone]  Physical Therapy Treatment  Patient Details  Name: Margaret [name] MRN: [id number] Date of Birth: [birth year] Referring Provider: Dr. Karlton Lemon  Encounter Date: 09/11/2016      PT End of Session - 09/11/16 1337    Visit Number 4   Number of Visits 16   Date for PT Re-Evaluation 10/24/16   PT Start Time 7026   PT Stop Time 1415   PT Time Calculation (min) 60 min   Activity Tolerance Patient tolerated treatment well   Behavior During Therapy [facility] for tasks assessed/performed      Past Medical History:  Diagnosis Date  . Allergic state 03/25/2012  . Aortic calcification ([place]) 08/27/2014  . At risk for colon cancer    Last colonoscopy in [place] 03/2009 found hyperplastic polyp only  . Atherosclerosis of aorta ([place]) 04/12/2014  . GERD (gastroesophageal reflux disease)   . Hiatal hernia with gastroesophageal reflux 04/27/2010   Qualifier: Diagnosis of  By: Danelle Earthly CMA, Darlene  Failed Omeprazole and Protonix   . Hyperlipidemia   . Osteopenia 08/31/2012  . Peripheral edema 12/13/2014  . Sleep apnea   . Unspecified sinusitis (chronic) 03/30/2013    Past Surgical History:  Procedure Laterality Date  . [place] STUDY N/A 12/15/2015   Procedure: [place] STUDY;  Surgeon: Mauri Pole, MD;  Location: [facility];  Service: Endoscopy;  Laterality: N/A;  . DILATION AND CURETTAGE OF UTERUS    . ESOPHAGEAL MANOMETRY N/A 12/15/2015   Procedure: ESOPHAGEAL MANOMETRY (EM);  Surgeon: Mauri Pole, MD;  Location: [facility];  Service: Endoscopy;  Laterality: N/A;    There were no vitals filed for this visit.      Subjective Assessment - 09/11/16 1315    Subjective Pt. reports the HEP stretching activities do provide relief from back pain however she has not been able to get to them since visiting the ED on 4/10.   Patient Stated Goals Improve pain, function   Currently in Pain? Yes   Pain Score 3    Pain Location Back   Pain Orientation Right   Pain Descriptors / Indicators Burning   Pain Type Acute pain   Pain Onset More than a month ago   Aggravating Factors  prolonged walking and standing    Multiple Pain Sites No                         [facility] Adult PT Treatment/Exercise - 09/11/16 1329      Lumbar Exercises: Stretches   Passive Hamstring Stretch 30 seconds;2 reps   Passive Hamstring Stretch Limitations supine with strap - bilateral   Single Knee to Chest Stretch 2 reps;30 seconds   Single Knee to Chest Stretch Limitations bilateral; 2nd set self-stretch      Lumbar Exercises: Aerobic   Stationary Bike NuStep: lvl 6, 6 min      Lumbar Exercises: Standing   Functional Squats 10 reps;3 seconds  3" hold at bottom; cues for core bracing   Functional Squats Limitations minisquat at counter   Row AROM;Strengthening;Both;15 reps   Theraband Level (Row) Level 3 (Green)   Row Limitations 3" hold    Shoulder Extension AROM;Strengthening;Both;Theraband;15 reps   Theraband Level (Shoulder Extension) Level 3 (Green)   Shoulder Extension Limitations 3" hold      Lumbar Exercises: Supine  Ab Set 10 reps;5 seconds   AB Set Limitations with sustained hip abduction/ER with green TB    Bridge 15 reps   Bridge Limitations with sustained hip abd/ER with green TB      Lumbar Exercises: Sidelying   Clam 10 reps;3 seconds   Clam Limitations with red TB around knees      Moist Heat Therapy   Number Minutes Moist Heat 15 Minutes   Moist Heat Location Lumbar Spine     Electrical Stimulation   Electrical Stimulation Location lumbar (L sided paraspinals)   Electrical Stimulation Action IFC   Electrical Stimulation Parameters 80-150Hz , intensity to pt. tolerance, 15'   Electrical Stimulation Goals Tone;Pain                  PT Short Term Goals - 09/01/16 0944      PT  SHORT TERM GOAL #1   Title Patient to be independent with HEP (09/26/16)   Status On-going     PT SHORT TERM GOAL #2   Title Patient to demonstrate good posture and body mechanics to reduced stressors placed on low back musculature for reduced pain (09/26/16)   Status On-going           PT Long Term Goals - 09/01/16 0944      PT LONG TERM GOAL #1   Title Patient to be independent with advanced HEP (10/24/16)   Status On-going     PT LONG TERM GOAL #2   Title Patient to report ability to ambulate for >/= 1 mile with no increase in back pain (10/24/16)   Status On-going     PT LONG TERM GOAL #3   Title Patient to report ability to perform transfers from various positions with no increase in pain (10/24/16)   Status On-going     PT LONG TERM GOAL #4   Title Patient to improve B LE by >/= 1 grade (10/24/16)   Status On-going               Plan - 09/11/16 1517    Clinical Impression Statement Pt. reports back pain has been worse since visiting the ED on 4.10 however has not been able to perform HEP since that point.  Pt. visited ED on 4.10 with numbness in R hand, which she feared was due to a stroke.  Testing ruled out a stroke and pt. has f/u with PCP tomorrow 4.17.  Treatment today focusing on gentle LE/lumbar stretching and lumbopelvic stability activities.  Pt. noting good relief with stretching and E-stim/Moist heat combo to end treatment today leaving pain free.  Pt. instructed to start back performance of HEP daily for full benefit from therapy.     PT Treatment/Interventions ADLs/Self Care Home Management;Cryotherapy;Electrical Stimulation;Moist Heat;Traction;Ultrasound;Neuromuscular re-education;Balance training;Therapeutic exercise;Therapeutic activities;Functional mobility training;Stair training;Gait training;Patient/family education;Manual techniques;Passive range of motion;Taping;Dry needling   PT Next Visit Plan Progress standing lumbopelvic stability activities per  tolerance; monitor adherence to HEP      Patient will benefit from skilled therapeutic intervention in order to improve the following deficits and impairments:  Decreased activity tolerance, Decreased range of motion, Decreased mobility, Decreased strength, Pain, Difficulty walking  Visit Diagnosis: Acute low back pain, unspecified back pain laterality, with sciatica presence unspecified  Abnormal posture  Muscle weakness (generalized)     Problem List Patient Active Problem List   Diagnosis Date Noted  . Cervical radiculopathy   . 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 TIA (transient ischemic attack) 09/06/2016  . Right  hand weakness   . Peripheral edema 12/13/2014  . Atherosclerosis of aorta ([place]) 04/12/2014  . Rash and nonspecific skin eruption 03/31/2014  . Joint stiffness 03/31/2014  . Dysuria 02/08/2014  . Essential hypertension 02/08/2014  . Atypical chest pain 01/21/2014  . Chest pain 07/27/2013  . Hyperglycemia 07/27/2013  . Preventative health care 03/30/2013  . Left shoulder pain 11/14/2012  . Osteopenia 08/31/2012  . Back pain 03/25/2012  . Knee pain 03/25/2012  . Allergic state 03/25/2012  . HELICOBACTER PYLORI INFECTION 06/08/2010  . Hiatal hernia with gastroesophageal reflux 04/27/2010  . Hyperlipidemia, mixed 04/07/2010  . Sleep apnea 04/07/2010    Bess Harvest, PTA 09/11/16 3:26 PM  [facility] [address]  [place], [place], [address] Phone: [phone]   Fax:  [phone]  Name: Margaret [name] MRN: [id number] Date of Birth: 10-28-1950

## 2016-09-12 ENCOUNTER — Ambulatory Visit (INDEPENDENT_AMBULATORY_CARE_PROVIDER_SITE_OTHER): Payer: BLUE CROSS/BLUE SHIELD | Admitting: Family Medicine

## 2016-09-12 ENCOUNTER — Encounter: Payer: Self-pay | Admitting: Family Medicine

## 2016-09-12 VITALS — BP 131/54 | HR 64 | Temp 97.9°F | Ht 62.0 in | Wt 176.2 lb

## 2016-09-12 DIAGNOSIS — K449 Diaphragmatic hernia without obstruction or gangrene: Secondary | ICD-10-CM

## 2016-09-12 DIAGNOSIS — R0789 Other chest pain: Secondary | ICD-10-CM

## 2016-09-12 DIAGNOSIS — K219 Gastro-esophageal reflux disease without esophagitis: Secondary | ICD-10-CM | POA: Diagnosis not present

## 2016-09-12 DIAGNOSIS — R739 Hyperglycemia, unspecified: Secondary | ICD-10-CM

## 2016-09-12 DIAGNOSIS — G451 Carotid artery syndrome (hemispheric): Secondary | ICD-10-CM

## 2016-09-12 DIAGNOSIS — M549 Dorsalgia, unspecified: Secondary | ICD-10-CM | POA: Diagnosis not present

## 2016-09-12 DIAGNOSIS — A048 Other specified bacterial intestinal infections: Secondary | ICD-10-CM

## 2016-09-12 DIAGNOSIS — R29898 Other symptoms and signs involving the musculoskeletal system: Secondary | ICD-10-CM

## 2016-09-12 DIAGNOSIS — I10 Essential (primary) hypertension: Secondary | ICD-10-CM

## 2016-09-12 DIAGNOSIS — M5412 Radiculopathy, cervical region: Secondary | ICD-10-CM | POA: Diagnosis not present

## 2016-09-12 DIAGNOSIS — E782 Mixed hyperlipidemia: Secondary | ICD-10-CM

## 2016-09-12 DIAGNOSIS — R110 Nausea: Secondary | ICD-10-CM

## 2016-09-12 DIAGNOSIS — M5410 Radiculopathy, site unspecified: Secondary | ICD-10-CM | POA: Diagnosis not present

## 2016-09-12 MED ORDER — ROSUVASTATIN CALCIUM 10 MG PO TABS: 10.0000 mg | ORAL_TABLET | Freq: Every day | ORAL | 6 refills | Status: DC

## 2016-09-12 NOTE — Assessment & Plan Note (Signed)
c/o upper abdominal pain, nausea and worsening dyspepsia. Will increase Omeprazole 2o mg to 2 tabs po qd and see if that helps. Avoid offending foods, rolaids prn and complete US of abd ordered and referral back to gastro is ordered as well

## 2016-09-12 NOTE — Assessment & Plan Note (Signed)
Work up in hospital inconclusive for TIA but she will continue ECASA 325 mg til seen by neurology, consider dropping dosing to 81 mg ECASA 1 or 2 daily to see if that helps GI issues

## 2016-09-12 NOTE — Progress Notes (Signed)
Patient ID: Margaret [name], female   DOB: 1950-10-31, 66 y.o.   MRN: [id number]   Subjective:  I acted as a Education administrator for Penni Homans, [place], [place]   Patient ID: Margaret [name], female    DOB: 1950/10/20, 66 y.o.   MRN: [id number]  Chief Complaint  Patient presents with  . Follow-up    Hospital F/U. Admitted to [facility] on 09/05/2016    HPI  Patient is in today for Hospital F/U. Admitted to [facility] on 09/05/2016, for a two day stay, for a possible TIA. Wants to discuss the MRI performed. Patient has ongoing concerns with back pain. Patient has a Hx of HTN, TIA, osteopenia, mixed hyperlipidemia. Patient has no additional acute concerns noted at this time. Patient's initial symptom was of right hand weakness and numbness. She had been sitting on her couch watching TV when symptoms developed. She denies radicular symptoms down the arm now but does acknowledge ongoing neck pain. She initially had some right hand numbness but that has improved. No falls or trauma. The symptom have not recurred since she's been back from the hospital. She does continue to endorse abdominal pain, mid back pain, low back pain, epigastric discomfort most notable after fatty meals as well as nausea and ongoing dyspepsia all to her throat on a regular basis. Denies CP/palp/SOB/HA/congestion/fevers or GU c/o. Taking meds as prescribed. Imaging and lab work from hospital visit reviewed.  Patient Care Team: Mosie Lukes, MD as PCP - General (Family Medicine) Rigoberto Noel, MD as Consulting Physician (Pulmonary Disease) Lelon Perla, MD as Consulting Physician (Cardiology) Collene Schlichter, Student-PA (Inactive) as Consulting Physician (Gastroenterology)   Past Medical History:  Diagnosis Date  . Allergic state 03/25/2012  . Aortic calcification ([place]) 08/27/2014  . At risk for colon cancer    Last colonoscopy in [place] 03/2009 found hyperplastic polyp only  .  Atherosclerosis of aorta ([place]) 04/12/2014  . GERD (gastroesophageal reflux disease)   . Hiatal hernia with gastroesophageal reflux 04/27/2010   Qualifier: Diagnosis of  By: Danelle Earthly CMA, Darlene  Failed Omeprazole and Protonix   . Hyperlipidemia   . Osteopenia 08/31/2012  . Peripheral edema 12/13/2014  . Sleep apnea   . Unspecified sinusitis (chronic) 03/30/2013    Past Surgical History:  Procedure Laterality Date  . [place] STUDY N/A 12/15/2015   Procedure: [place] STUDY;  Surgeon: Mauri Pole, MD;  [facility];  Service: Endoscopy;  Laterality: N/A;  . DILATION AND CURETTAGE OF UTERUS    . ESOPHAGEAL MANOMETRY N/A 12/15/2015   Procedure: ESOPHAGEAL MANOMETRY (EM);  Surgeon: Mauri Pole, MD;  [facility];  Service: Endoscopy;  Laterality: N/A;    Family History  Problem Relation Age of Onset  . Stroke Mother   . Lung cancer Father   . Stroke Sister     53  . Thyroid disease Sister   . Diabetes Sister   . Hypertension Sister   . Hyperlipidemia Sister   . Hypertension Sister   . Sleep apnea Son   . Obesity Son     Social History   Social History  . Marital status: Married    Spouse name: N/A  . Number of children: 2  . Years of education: N/A   Occupational History  . Accountant    Social History Main Topics  . Smoking status: Never Smoker  . Smokeless tobacco: Never Used  . Alcohol use 0.0 oz/week  Comment: Rarely  . Drug use: No  . Sexual activity: Not on file     Comment: no dietary restrictions, lives with husand, works at [employer]   Other Topics Concern  . Not on file   Social History Narrative  . No narrative on file    Outpatient Medications Prior to Visit  Medication Sig Dispense Refill  . aspirin EC 325 MG EC tablet Take 1 tablet (325 mg total) by mouth daily. 30 tablet 0  . fish oil-omega-3 fatty acids 1000 MG capsule Take 2 g by mouth daily.    . Multiple Vitamin (MULTIVITAMIN WITH MINERALS) TABS tablet Take 1  tablet by mouth daily.    Marland Kitchen omeprazole (PRILOSEC) 20 MG capsule Take 1 capsule (20 mg total) by mouth daily as needed. 60 capsule 0  . rosuvastatin (CRESTOR) 10 MG tablet TAKE 1 TABLET BY MOUTH DAILY. 30 tablet 6   No facility-administered medications prior to visit.     No Known Allergies  Review of Systems  Constitutional: Negative for fever and malaise/fatigue.  HENT: Negative for congestion.   Eyes: Negative for blurred vision.  Respiratory: Negative for shortness of breath.   Cardiovascular: Positive for chest pain. Negative for palpitations and leg swelling.  Gastrointestinal: Positive for abdominal pain, heartburn and nausea. Negative for blood in stool, melena and vomiting.  Genitourinary: Negative for dysuria and frequency.  Musculoskeletal: Positive for back pain and neck pain. Negative for falls.  Skin: Negative for rash.  Neurological: Positive for focal weakness. Negative for dizziness, loss of consciousness and headaches.  Endo/Heme/Allergies: Negative for environmental allergies.  Psychiatric/Behavioral: Negative for depression. The patient is not nervous/anxious.        Objective:    Physical Exam  Constitutional: She is oriented to person, place, and time. She appears well-developed and well-nourished. No distress.  HENT:  Head: Normocephalic and atraumatic.  Nose: Nose normal.  Eyes: Right eye exhibits no discharge. Left eye exhibits no discharge.  Neck: Normal range of motion. Neck supple.  Cardiovascular: Normal rate and regular rhythm.   No murmur heard. Pulmonary/Chest: Effort normal and breath sounds normal.  Abdominal: Soft. Bowel sounds are normal. There is no tenderness.  Musculoskeletal: She exhibits no edema.  Neurological: She is alert and oriented to person, place, and time.  Skin: Skin is warm and dry.  Psychiatric: She has a normal mood and affect.  Nursing note and vitals reviewed.   BP (!) 131/54 (BP Location: Left Arm, Patient Position:  Sitting, Cuff Size: Normal)   Pulse 64   Temp 97.9 F (36.6 C) (Oral)   Ht 5\' 2"  (1.575 m)   Wt 176 lb 3.2 oz (79.9 kg)   SpO2 98% Comment: RA  BMI 32.23 kg/m  Wt Readings from Last 3 Encounters:  09/12/16 176 lb 3.2 oz (79.9 kg)  09/06/16 170 lb 10.2 oz (77.4 kg)  08/17/16 170 lb (77.1 kg)   BP Readings from Last 3 Encounters:  09/12/16 (!) 131/54  09/07/16 (!) 116/57  08/17/16 (!) 143/80     Immunization History  Administered Date(s) Administered  . Influenza,inj,Quad PF,36+ Mos 03/24/2013, 04/07/2014, 02/11/2015, 06/01/2016  . Pneumococcal Conjugate-13 04/10/2013    Health Maintenance  Topic Date Due  . PAP SMEAR  02/27/2016  . TETANUS/TDAP  03/14/2017 (Originally 05/13/1972)  . INFLUENZA VACCINE  12/27/2016  . MAMMOGRAM  09/06/2017  . COLONOSCOPY  06/12/2018  . Hepatitis C Screening  Completed  . HIV Screening  Completed    Lab Results  Component Value  Date   WBC 6.1 09/06/2016   HGB 13.1 09/06/2016   HCT 39.6 09/06/2016   PLT 205 09/06/2016   GLUCOSE 108 (H) 09/06/2016   CHOL 150 09/06/2016   TRIG 106 09/06/2016   HDL 49 09/06/2016   LDLCALC 80 09/06/2016   ALT 31 09/06/2016   AST 23 09/06/2016   NA 142 09/06/2016   K 3.8 09/06/2016   CL 104 09/06/2016   CREATININE 0.64 09/06/2016   BUN 8 09/06/2016   CO2 29 09/06/2016   TSH 2.06 07/21/2016   HGBA1C 6.0 (H) 09/06/2016   MICROALBUR 3.06 (H) 05/16/2011    Lab Results  Component Value Date   TSH 2.06 07/21/2016   Lab Results  Component Value Date   WBC 6.1 09/06/2016   HGB 13.1 09/06/2016   HCT 39.6 09/06/2016   MCV 86.1 09/06/2016   PLT 205 09/06/2016   Lab Results  Component Value Date   NA 142 09/06/2016   K 3.8 09/06/2016   CO2 29 09/06/2016   GLUCOSE 108 (H) 09/06/2016   BUN 8 09/06/2016   CREATININE 0.64 09/06/2016   BILITOT 0.6 09/06/2016   ALKPHOS 72 09/06/2016   AST 23 09/06/2016   ALT 31 09/06/2016   PROT 6.5 09/06/2016   ALBUMIN 3.8 09/06/2016   CALCIUM 9.5 09/06/2016     ANIONGAP 9 09/06/2016   GFR 92.95 02/21/2016   Lab Results  Component Value Date   CHOL 150 09/06/2016   Lab Results  Component Value Date   HDL 49 09/06/2016   Lab Results  Component Value Date   LDLCALC 80 09/06/2016   Lab Results  Component Value Date   TRIG 106 09/06/2016   Lab Results  Component Value Date   CHOLHDL 3.1 09/06/2016   Lab Results  Component Value Date   HGBA1C 6.0 (H) 09/06/2016         Assessment & Plan:   Problem List Items Addressed This Visit    Hyperlipidemia, mixed    Encouraged heart healthy diet, increase exercise, avoid trans fats, consider a krill oil cap daily      Relevant Medications   rosuvastatin (CRESTOR) 10 MG tablet   Hiatal hernia with gastroesophageal reflux    c/o upper abdominal pain, nausea and worsening dyspepsia. Will increase Omeprazole 2o mg to 2 tabs po qd and see if that helps. Avoid offending foods, rolaids prn and complete US of abd ordered and referral back to gastro is ordered as well      Helicobacter pylori infection    History of, now with recurrent symptoms. Referred to gastroenterology for further consideration and asked to avoid offending foods. Encouraged daily Omeprazole. With abdominal pain is also set up for abdominal ultrasound.       Back pain    Encouraged moist heat and gentle stretching as tolerated. May try NSAIDs and prescription meds as directed and report if symptoms worsen or seek immediate care. Consider PT vs chiropractic care.       Relevant Orders   US Abdomen Complete (Completed)   Hyperglycemia    hgba1c acceptable, minimize simple carbs. Increase exercise as tolerated.       Atypical chest pain    She correlates with abdominal pain and nausea after eating fatty foods and is likely gallbladder related but due to risk factors is offered a cardiology consultation but declines. She will let us know if she decides to proceed and if he pain changes she will let us know  Essential hypertension    Well controlled, no changes to meds. Encouraged heart healthy diet such as the DASH diet and exercise as tolerated.       Relevant Medications   rosuvastatin (CRESTOR) 10 MG tablet   TIA (transient ischemic attack)    Work up in hospital inconclusive for TIA but she will continue ECASA 325 mg til seen by neurology, consider dropping dosing to 81 mg ECASA 1 or 2 daily to see if that helps GI issues      Relevant Medications   rosuvastatin (CRESTOR) 10 MG tablet   Right hand weakness    Has been seen in hospital recently by neurology Dr Erlinda Hong and while her symptoms are improved she would like to proceed with outpatient work up, EMG etc that was recommended she is referredt o neurology.       Cervical radiculopathy    Was admitted to the hospital for right hand weakness and numbness. MRI of head unremarkable but neck showed diffuse DDD and foraminal stenosis most severe at C4 to right. She was seen in hospital by Dr Erlinda Hong of neuro and they discussed outpatient f/u and EMG, referral is placed today.        Other Visit Diagnoses    Radiculopathy, unspecified spinal region    -  Primary   Relevant Orders   Ambulatory referral to Neurology   Gastroesophageal reflux disease, esophagitis presence not specified       Relevant Orders   Ambulatory referral to Gastroenterology   US Abdomen Complete (Completed)   Nausea       Relevant Orders   US Abdomen Complete (Completed)      I have changed Ms. Stuckert rosuvastatin. I am also having her maintain her fish oil-omega-3 fatty acids, omeprazole, multivitamin with minerals, and aspirin.  Meds ordered this encounter  Medications  . rosuvastatin (CRESTOR) 10 MG tablet    Sig: Take 1 tablet (10 mg total) by mouth daily.    Dispense:  30 tablet    Refill:  6    CMA served as scribe during this visit. History, Physical and Plan performed by medical provider. Documentation and orders reviewed and attested to.  Penni Homans, MD

## 2016-09-12 NOTE — Assessment & Plan Note (Signed)
Was admitted to the hospital for right hand weakness and numbness. MRI of head unremarkable but neck showed diffuse DDD and foraminal stenosis most severe at C4 to right. She was seen in hospital by Dr Erlinda Hong of neuro and they discussed outpatient f/u and EMG, referral is placed today.

## 2016-09-12 NOTE — Assessment & Plan Note (Signed)
hgba1c acceptable, minimize simple carbs. Increase exercise as tolerated.  

## 2016-09-12 NOTE — Assessment & Plan Note (Signed)
Encouraged heart healthy diet, increase exercise, avoid trans fats, consider a krill oil cap daily 

## 2016-09-12 NOTE — Progress Notes (Signed)
Pre visit review using our clinic review tool, if applicable. No additional management support is needed unless otherwise documented below in the visit note. 

## 2016-09-12 NOTE — Assessment & Plan Note (Signed)
She correlates with abdominal pain and nausea after eating fatty foods and is likely gallbladder related but due to risk factors is offered a cardiology consultation but declines. She will let us know if she decides to proceed and if he pain changes she will let us know

## 2016-09-12 NOTE — Patient Instructions (Signed)
Back Pain, Adult  Back pain is very common. The pain often gets better over time. The cause of back pain is usually not dangerous. Most people can learn to manage their back pain on their own.  Follow these instructions at home:  Watch your back pain for any changes. The following actions may help to lessen any pain you are feeling:  · Stay active. Start with short walks on flat ground if you can. Try to walk farther each day.  · Exercise regularly as told by your doctor. Exercise helps your back heal faster. It also helps avoid future injury by keeping your muscles strong and flexible.  · Do not sit, drive, or stand in one place for more than 30 minutes.  · Do not stay in bed. Resting more than 1-2 days can slow down your recovery.  · Be careful when you bend or lift an object. Use good form when lifting:  ? Bend at your knees.  ? Keep the object close to your body.  ? Do not twist.  · Sleep on a firm mattress. Lie on your side, and bend your knees. If you lie on your back, put a pillow under your knees.  · Take medicines only as told by your doctor.  · Put ice on the injured area.  ? Put ice in a plastic bag.  ? Place a towel between your skin and the bag.  ? Leave the ice on for 20 minutes, 2-3 times a day for the first 2-3 days. After that, you can switch between ice and heat packs.  · Avoid feeling anxious or stressed. Find good ways to deal with stress, such as exercise.  · Maintain a healthy weight. Extra weight puts stress on your back.    Contact a doctor if:  · You have pain that does not go away with rest or medicine.  · You have worsening pain that goes down into your legs or buttocks.  · You have pain that does not get better in one week.  · You have pain at night.  · You lose weight.  · You have a fever or chills.  Get help right away if:  · You cannot control when you poop (bowel movement) or pee (urinate).  · Your arms or legs feel weak.  · Your arms or legs lose feeling (numbness).  · You feel sick  to your stomach (nauseous) or throw up (vomit).  · You have belly (abdominal) pain.  · You feel like you may pass out (faint).  This information is not intended to replace advice given to you by your health care provider. Make sure you discuss any questions you have with your health care provider.  Document Released: 11/01/2007 Document Revised: 10/21/2015 Document Reviewed: 09/16/2013  Elsevier Interactive Patient Education © 2017 [employer]

## 2016-09-12 NOTE — Assessment & Plan Note (Signed)
Well controlled, no changes to meds. Encouraged heart healthy diet such as the DASH diet and exercise as tolerated.  °

## 2016-09-14 ENCOUNTER — Ambulatory Visit: Payer: BLUE CROSS/BLUE SHIELD | Admitting: Physical Therapy

## 2016-09-14 DIAGNOSIS — M542 Cervicalgia: Secondary | ICD-10-CM

## 2016-09-14 DIAGNOSIS — R293 Abnormal posture: Secondary | ICD-10-CM

## 2016-09-14 DIAGNOSIS — M545 Low back pain: Secondary | ICD-10-CM

## 2016-09-14 DIAGNOSIS — M6281 Muscle weakness (generalized): Secondary | ICD-10-CM

## 2016-09-14 DIAGNOSIS — M5412 Radiculopathy, cervical region: Secondary | ICD-10-CM

## 2016-09-14 NOTE — Patient Instructions (Signed)
Neck Rotation    Begin with chin level, head centered over spine. Slowly turn head to look over shoulder, keeping head centered, shoulders and torso stationary. Hold __5__ seconds. Slowly return to starting position. Repeat to other side. Repeat _10-15___ times to each side. **lying down   Axial Extension (Chin Tuck)    Pull chin in and lengthen back of neck. Hold __5__ seconds while counting out loud. Repeat __15__ times. Do _2-3___ sessions per day.     Adduction (Active)    Maintaining erect posture, draw shoulders back while bringing elbows back and inward. Repeat __15__ times. Do __2__ sessions per day.    Flexibility: Upper Trapezius Stretch    Gently grasp right side of head while reaching behind back with other hand. Tilt head away until a gentle stretch is felt. Hold _30___ seconds. Repeat _3___ times per set.     Levator Scapula Stretch, Sitting    Sit, one hand tucked under hip on side to be stretched, other hand over top of head. Turn head toward other side and look down. Use hand on head to gently stretch neck in that position. Hold _30__ seconds. Repeat _3__ times per session.

## 2016-09-14 NOTE — Therapy (Signed)
[facility] [address]  [place], [place], [address] Phone: [phone]   Fax:  [phone]  Physical Therapy Treatment  Patient Details  Name: Margaret [name] MRN: [id number] Date of Birth: 1951-01-22 Referring Provider: Dr. Karlton Lemon  Encounter Date: 09/14/2016      PT End of Session - 09/14/16 1816    Visit Number 5   Number of Visits 16   Date for PT Re-Evaluation 10/24/16   PT Start Time 9371   PT Stop Time 1458   PT Time Calculation (min) 65 min   Activity Tolerance Patient tolerated treatment well   Behavior During Therapy [facility] for tasks assessed/performed      Past Medical History:  Diagnosis Date  . Allergic state 03/25/2012  . Aortic calcification ([place]) 08/27/2014  . At risk for colon cancer    Last colonoscopy in [place] 03/2009 found hyperplastic polyp only  . Atherosclerosis of aorta ([place]) 04/12/2014  . GERD (gastroesophageal reflux disease)   . Hiatal hernia with gastroesophageal reflux 04/27/2010   Qualifier: Diagnosis of  By: Danelle Earthly CMA, Darlene  Failed Omeprazole and Protonix   . Hyperlipidemia   . Osteopenia 08/31/2012  . Peripheral edema 12/13/2014  . Sleep apnea   . Unspecified sinusitis (chronic) 03/30/2013    Past Surgical History:  Procedure Laterality Date  . [place] STUDY N/A 12/15/2015   Procedure: [place] STUDY;  Surgeon: Mauri Pole, MD;  Location: [facility];  Service: Endoscopy;  Laterality: N/A;  . DILATION AND CURETTAGE OF UTERUS    . ESOPHAGEAL MANOMETRY N/A 12/15/2015   Procedure: ESOPHAGEAL MANOMETRY (EM);  Surgeon: Mauri Pole, MD;  Location: [facility];  Service: Endoscopy;  Laterality: N/A;    There were no vitals filed for this visit.      Subjective Assessment - 09/14/16 1353    Subjective went to ED on 4/10 due to hand weakness - thought she was having stroke. Had MRI and CT scan - TIA vs cervical radiculopathy. Imaging revealing forminal  stenosis. Feels like she has some trouble looking around - doesn't feel like she is as "flexible" as she was. Reports pain on L side of neck into shoulder. Feels like her ears are affected whe she lies down - "like when you have a cold." Does report a history of headaches daily - finds relief with head massage and pain meds (tylenol). Only has N&T into hand in the morning upon waking - resolves throughout day.    Diagnostic tests MRI: Multilevel degenerative spondylolysis as detailed above with resultant mild canal stenosis at C4-5 and C5-6. Resultant multilevel bilateral foraminal narrowing. Most notable findings include moderate right C3 foraminal stenosis, severe right with moderate left C4 foraminal narrowing, moderate right C5 foraminal stenosis, and severe left C6 foraminal narrowing.    Patient Stated Goals Improve pain, function   Currently in Pain? Yes   Pain Score 5    Pain Location Neck   Pain Orientation Right;Left;Posterior   Pain Descriptors / Indicators Aching;Constant;Discomfort   Pain Type Acute pain   Aggravating Factors  moving head/neck, lying on L side   Pain Relieving Factors head massage, tylenol   Multiple Pain Sites Yes   Pain Score 5   Pain Location Back   Pain Orientation Mid;Lower   Pain Descriptors / Indicators Aching;Sore;Discomfort            [facility] PT Assessment - 09/14/16 1402      AROM  AROM Assessment Site Cervical   Cervical Flexion 40   Cervical Extension 24   Cervical - Right Side Bend 9   Cervical - Left Side Bend 17   Cervical - Right Rotation 26   Cervical - Left Rotation 22     Strength   Strength Assessment Site Shoulder  grossly 4-/5     Palpation   Palpation comment tenderness to palpation with tightness noted throughout B UT (R>L), B LS, B cervical paraspinals, B suboccipital mm                     [facility] Adult PT Treatment/Exercise - 09/14/16 0001      Exercises   Exercises Neck     Neck Exercises: Seated   Neck  Retraction 10 reps;5 secs   Other Seated Exercise scap retraction - 10 x 5" hold     Neck Exercises: Supine   Cervical Rotation 10 reps;Both     Modalities   Modalities Electrical Stimulation;Moist Heat     Moist Heat Therapy   Number Minutes Moist Heat 15 Minutes   Moist Heat Location Cervical     Electrical Stimulation   Electrical Stimulation Location B cervical paraspinals to B UT   Electrical Stimulation Action IFC   Electrical Stimulation Parameters to tolerance   Electrical Stimulation Goals Pain;Tone     Manual Therapy   Manual Therapy Soft tissue mobilization;Myofascial release   Manual therapy comments hooklying with bolster   Soft tissue mobilization STM to B UT, B LS, B cervical paraspinals, B suboccipital mm   Myofascial Release manual trigger point release B UT (R>L)     Neck Exercises: Stretches   Upper Trapezius Stretch 3 reps;30 seconds   Upper Trapezius Stretch Limitations bilateral   Levator Stretch 3 reps;30 seconds   Levator Stretch Limitations bilateral                PT Education - 09/14/16 1816    Education provided Yes   Education Details HEP for cervical spine   Person(s) Educated Patient   Methods Explanation;Demonstration;Handout   Comprehension Verbalized understanding;Returned demonstration          PT Short Term Goals - 09/01/16 0944      PT SHORT TERM GOAL #1   Title Patient to be independent with HEP (09/26/16)   Status On-going     PT SHORT TERM GOAL #2   Title Patient to demonstrate good posture and body mechanics to reduced stressors placed on low back musculature for reduced pain (09/26/16)   Status On-going           PT Long Term Goals - 09/14/16 1817      PT LONG TERM GOAL #1   Title Patient to be independent with advanced HEP for both low back and cervical spine (10/24/16)   Status Revised     PT LONG TERM GOAL #2   Title Patient to report ability to ambulate for >/= 1 mile with no increase in back pain  (10/24/16)   Status On-going     PT LONG TERM GOAL #3   Title Patient to report ability to perform transfers from various positions with no increase in pain (10/24/16)   Status On-going     PT LONG TERM GOAL #4   Title Patient to improve B LE by >/= 1 grade (10/24/16)   Status On-going     PT LONG TERM GOAL #5   Title Patient to report neck pain of </=  2/10 for greater than 2 weeks (10/24/16)   Status New     Additional Long Term Goals   Additional Long Term Goals Yes     PT LONG TERM GOAL #6   Title Patient to report no N&T into hands after sleeping for greater than 2 weeks (10/24/16)   Status New     PT LONG TERM GOAL #7   Title Patient to report ability to perform ADLs and driving without neck pain/tightness limiting function (10/24/16)   Status New               Plan - 09/14/16 1819    Clinical Impression Statement PT assessing patient today for cervical pain/radiculopathy after presenting to ED on 09/05/16 with UE numbness and tingling. Patient with full work-up to rule out CVA and TIA with imaging only revealing foraminal stenosis with likely culprit of cervical radiculopathy to be cause of N&T into hand. Patient to follow-up with neuro MD for EMG to assess severity. Paitent today with reduced cervical AROM, tenderness and tightness noted throughout cervical and upper back musculature, as well as poo posturing of cervical spine - all likely contributing to current symptoms. PT plan of care now to include treatment for cervicalgia with associated radiculopathy symptoms.    PT Treatment/Interventions ADLs/Self Care Home Management;Cryotherapy;Electrical Stimulation;Moist Heat;Traction;Ultrasound;Neuromuscular re-education;Balance training;Therapeutic exercise;Therapeutic activities;Functional mobility training;Stair training;Gait training;Patient/family education;Manual techniques;Passive range of motion;Taping;Dry needling   PT Next Visit Plan Progress standing lumbopelvic  stability activities per tolerance; include gentle cervical spine stretching and manual; modalities prn as indicated   Consulted and Agree with Plan of Care Patient      Patient will benefit from skilled therapeutic intervention in order to improve the following deficits and impairments:  Decreased activity tolerance, Decreased range of motion, Decreased mobility, Decreased strength, Pain, Difficulty walking, Postural dysfunction  Visit Diagnosis: Acute low back pain, unspecified back pain laterality, with sciatica presence unspecified - Plan: PT plan of care cert/re-cert  Abnormal posture - Plan: PT plan of care cert/re-cert  Muscle weakness (generalized) - Plan: PT plan of care cert/re-cert  Cervicalgia - Plan: PT plan of care cert/re-cert  Radiculopathy, cervical region - Plan: PT plan of care cert/re-cert     Problem List Patient Active Problem List   Diagnosis Date Noted  . Cervical radiculopathy   . 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 TIA (transient ischemic attack) 09/06/2016  . Right hand weakness   . Peripheral edema 12/13/2014  . Atherosclerosis of aorta ([place]) 04/12/2014  . Rash and nonspecific skin eruption 03/31/2014  . Joint stiffness 03/31/2014  . Dysuria 02/08/2014  . Essential hypertension 02/08/2014  . Atypical chest pain 01/21/2014  . Chest pain 07/27/2013  . Hyperglycemia 07/27/2013  . Preventative health care 03/30/2013  . Left shoulder pain 11/14/2012  . Osteopenia 08/31/2012  . Back pain 03/25/2012  . Knee pain 03/25/2012  . Allergic state 03/25/2012  . HELICOBACTER PYLORI INFECTION 06/08/2010  . Hiatal hernia with gastroesophageal reflux 04/27/2010  . Hyperlipidemia, mixed 04/07/2010  . Sleep apnea 04/07/2010     Lanney Gins, PT, DPT 09/14/16 6:29 PM   [facility] [address]  [place], [place], [address] Phone: [phone]   Fax:  [phone]  Name: Margaret [name] MRN: [id number] Date of Birth: [birth year]

## 2016-09-15 ENCOUNTER — Ambulatory Visit (HOSPITAL_BASED_OUTPATIENT_CLINIC_OR_DEPARTMENT_OTHER)
Admission: RE | Admit: 2016-09-15 | Discharge: 2016-09-15 | Disposition: A | Discharge status: Home / Self Care | Payer: BLUE CROSS/BLUE SHIELD | Source: Ambulatory Visit | Attending: Family Medicine | Admitting: Family Medicine

## 2016-09-15 DIAGNOSIS — K219 Gastro-esophageal reflux disease without esophagitis: Secondary | ICD-10-CM | POA: Insufficient documentation

## 2016-09-15 DIAGNOSIS — R109 Unspecified abdominal pain: Secondary | ICD-10-CM | POA: Diagnosis not present

## 2016-09-15 DIAGNOSIS — M549 Dorsalgia, unspecified: Secondary | ICD-10-CM | POA: Diagnosis present

## 2016-09-15 DIAGNOSIS — K760 Fatty (change of) liver, not elsewhere classified: Secondary | ICD-10-CM | POA: Diagnosis not present

## 2016-09-15 DIAGNOSIS — K7689 Other specified diseases of liver: Secondary | ICD-10-CM | POA: Insufficient documentation

## 2016-09-15 DIAGNOSIS — R110 Nausea: Secondary | ICD-10-CM

## 2016-09-17 NOTE — Assessment & Plan Note (Addendum)
History of, now with recurrent symptoms. Referred to gastroenterology for further consideration and asked to avoid offending foods. Encouraged daily Omeprazole. With abdominal pain is also set up for abdominal ultrasound.

## 2016-09-17 NOTE — Assessment & Plan Note (Signed)
Encouraged moist heat and gentle stretching as tolerated. May try NSAIDs and prescription meds as directed and report if symptoms worsen or seek immediate care. Consider PT vs chiropractic care.

## 2016-09-17 NOTE — Assessment & Plan Note (Signed)
Has been seen in hospital recently by neurology Dr Erlinda Hong and while her symptoms are improved she would like to proceed with outpatient work up, EMG etc that was recommended she is referredt o neurology.

## 2016-09-18 ENCOUNTER — Ambulatory Visit: Payer: BLUE CROSS/BLUE SHIELD | Admitting: Physical Therapy

## 2016-09-18 DIAGNOSIS — M545 Low back pain: Secondary | ICD-10-CM | POA: Diagnosis not present

## 2016-09-18 DIAGNOSIS — R293 Abnormal posture: Secondary | ICD-10-CM

## 2016-09-18 DIAGNOSIS — M5412 Radiculopathy, cervical region: Secondary | ICD-10-CM

## 2016-09-18 DIAGNOSIS — M6281 Muscle weakness (generalized): Secondary | ICD-10-CM

## 2016-09-18 DIAGNOSIS — M542 Cervicalgia: Secondary | ICD-10-CM

## 2016-09-18 NOTE — Therapy (Signed)
[facility] [address]  [place], [place], [address] Phone: [phone]   Fax:  [phone]  Physical Therapy Treatment  Patient Details  Name: Margaret [name] MRN: [id number] Date of Birth: [birth year] Referring Provider: Dr. Karlton Lemon  Encounter Date: 09/18/2016      PT End of Session - 09/18/16 1521    Visit Number 6   Number of Visits 16   Date for PT Re-Evaluation 10/24/16   PT Start Time 5427   PT Stop Time 1416   PT Time Calculation (min) 61 min   Activity Tolerance Patient tolerated treatment well   Behavior During Therapy [facility] for tasks assessed/performed      Past Medical History:  Diagnosis Date  . Allergic state 03/25/2012  . Aortic calcification ([place]) 08/27/2014  . At risk for colon cancer    Last colonoscopy in [place] 03/2009 found hyperplastic polyp only  . Atherosclerosis of aorta ([place]) 04/12/2014  . GERD (gastroesophageal reflux disease)   . Hiatal hernia with gastroesophageal reflux 04/27/2010   Qualifier: Diagnosis of  By: Danelle Earthly CMA, Darlene  Failed Omeprazole and Protonix   . Hyperlipidemia   . Osteopenia 08/31/2012  . Peripheral edema 12/13/2014  . Sleep apnea   . Unspecified sinusitis (chronic) 03/30/2013    Past Surgical History:  Procedure Laterality Date  . [place] STUDY N/A 12/15/2015   Procedure: [place] STUDY;  Surgeon: Mauri Pole, MD;  [facility];  Service: Endoscopy;  Laterality: N/A;  . DILATION AND CURETTAGE OF UTERUS    . ESOPHAGEAL MANOMETRY N/A 12/15/2015   Procedure: ESOPHAGEAL MANOMETRY (EM);  Surgeon: Mauri Pole, MD;  Location[facility];  Service: Endoscopy;  Laterality: N/A;    There were no vitals filed for this visit.      Subjective Assessment - 09/18/16 1318    Subjective Feeling well today - feels like the neck stretches are helping her move a little better - feels like the numbness and tingling happens after period of  rests   Diagnostic tests MRI: Multilevel degenerative spondylolysis as detailed above with resultant mild canal stenosis at C4-5 and C5-6. Resultant multilevel bilateral foraminal narrowing. Most notable findings include moderate right C3 foraminal stenosis, severe right with moderate left C4 foraminal narrowing, moderate right C5 foraminal stenosis, and severe left C6 foraminal narrowing.    Patient Stated Goals Improve pain, function   Currently in Pain? Yes   Pain Score --  "pressure" in low back                         [facility] Adult PT Treatment/Exercise - 09/18/16 0001      Neck Exercises: Seated   Neck Retraction 15 reps;5 secs     Neck Exercises: Sidelying   Other Sidelying Exercise open book - 10 x 5" each side     Lumbar Exercises: Standing   Row Strengthening;Both;15 reps;Theraband   Theraband Level (Row) Level 2 (Red)   Shoulder Extension Strengthening;Both;Theraband   Theraband Level (Shoulder Extension) Level 2 (Red)     Modalities   Modalities Electrical Stimulation;Moist Heat     Moist Heat Therapy   Number Minutes Moist Heat 15 Minutes   Moist Heat Location Cervical     Electrical Stimulation   Electrical Stimulation Location B cervical paraspinals to B UT   Electrical Stimulation Action IFC    Electrical Stimulation Parameters to tolerance   Electrical Stimulation Goals  Pain;Tone     Manual Therapy   Manual Therapy Soft tissue mobilization;Myofascial release   Manual therapy comments hooklying with bolster   Soft tissue mobilization STM to B UT, B LS, B cervical paraspinals, B suboccipital mm   Myofascial Release manual trigger point release B UT (R>L)     Neck Exercises: Stretches   Upper Trapezius Stretch 2 reps;60 seconds   Upper Trapezius Stretch Limitations manual   Levator Stretch 1 rep;60 seconds   Levator Stretch Limitations manual   Other Neck Stretches cervical rotation - 1 rep, 60 seconds - bilateral - manual                   PT Short Term Goals - 09/01/16 0944      PT SHORT TERM GOAL #1   Title Patient to be independent with HEP (09/26/16)   Status On-going     PT SHORT TERM GOAL #2   Title Patient to demonstrate good posture and body mechanics to reduced stressors placed on low back musculature for reduced pain (09/26/16)   Status On-going           PT Long Term Goals - 09/14/16 1817      PT LONG TERM GOAL #1   Title Patient to be independent with advanced HEP for both low back and cervical spine (10/24/16)   Status Revised     PT LONG TERM GOAL #2   Title Patient to report ability to ambulate for >/= 1 mile with no increase in back pain (10/24/16)   Status On-going     PT LONG TERM GOAL #3   Title Patient to report ability to perform transfers from various positions with no increase in pain (10/24/16)   Status On-going     PT LONG TERM GOAL #4   Title Patient to improve B LE by >/= 1 grade (10/24/16)   Status On-going     PT LONG TERM GOAL #5   Title Patient to report neck pain of </= 2/10 for greater than 2 weeks (10/24/16)   Status New     Additional Long Term Goals   Additional Long Term Goals Yes     PT LONG TERM GOAL #6   Title Patient to report no N&T into hands after sleeping for greater than 2 weeks (10/24/16)   Status New     PT LONG TERM GOAL #7   Title Patient to report ability to perform ADLs and driving without neck pain/tightness limiting function (10/24/16)   Status New               Plan - 09/18/16 1521    Clinical Impression Statement Patient doing well - feels as if the N&T into amrs/hands is not as pronounced as it has been - feels like the stretches given at last visit were very beneficial. COntinued manual therapy to upper neck/shoulder region conitnued today as patient is most restricted in these areas. Gentle mid back stretching and strengthening also continued today with good tolerance. Will contiue to address cervical and lumbar pain.     PT Treatment/Interventions ADLs/Self Care Home Management;Cryotherapy;Electrical Stimulation;Moist Heat;Traction;Ultrasound;Neuromuscular re-education;Balance training;Therapeutic exercise;Therapeutic activities;Functional mobility training;Stair training;Gait training;Patient/family education;Manual techniques;Passive range of motion;Taping;Dry needling   PT Next Visit Plan Progress standing lumbopelvic stability activities per tolerance; include gentle cervical spine stretching and manual; modalities prn as indicated   Consulted and Agree with Plan of Care Patient      Patient will benefit from skilled therapeutic intervention in order  to improve the following deficits and impairments:  Decreased activity tolerance, Decreased range of motion, Decreased mobility, Decreased strength, Pain, Difficulty walking, Postural dysfunction  Visit Diagnosis: Acute low back pain, unspecified back pain laterality, with sciatica presence unspecified  Abnormal posture  Muscle weakness (generalized)  Cervicalgia  Radiculopathy, cervical region     Problem List Patient Active Problem List   Diagnosis Date Noted  . Cervical radiculopathy   . 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 TIA (transient ischemic attack) 09/06/2016  . Right hand weakness   . Peripheral edema 12/13/2014  . Atherosclerosis of aorta ([place]) 04/12/2014  . Rash and nonspecific skin eruption 03/31/2014  . Joint stiffness 03/31/2014  . Dysuria 02/08/2014  . Essential hypertension 02/08/2014  . Atypical chest pain 01/21/2014  . Chest pain 07/27/2013  . Hyperglycemia 07/27/2013  . Preventative health care 03/30/2013  . Left shoulder pain 11/14/2012  . Osteopenia 08/31/2012  . Back pain 03/25/2012  . Knee pain 03/25/2012  . Allergic state 03/25/2012  . Helicobacter pylori infection 06/08/2010  . Hiatal hernia with gastroesophageal reflux 04/27/2010  . Hyperlipidemia, mixed 04/07/2010  . Sleep apnea 04/07/2010       Lanney Gins, PT, DPT 09/18/16 3:25 PM   [facility] [address]  [place], [place], [address] Phone: [phone]   Fax:  [phone]  Name: Margaret [name] MRN: [id number] Date of Birth: 09-14-1950

## 2016-09-20 ENCOUNTER — Telehealth: Payer: Self-pay | Admitting: Family Medicine

## 2016-09-20 NOTE — Telephone Encounter (Signed)
Pt says that the hospital started her on aspirin EC 325 MG EC tablet. She has been taking it longer than 10 days. She would like to be advised by PCP if she should continue taking medication.    Please assist pt further.   CB: [phone]

## 2016-09-20 NOTE — Telephone Encounter (Signed)
She should continue on the 325 aspirin til seen by neurology unless she has any concerning side effects.

## 2016-09-21 ENCOUNTER — Ambulatory Visit: Payer: BLUE CROSS/BLUE SHIELD

## 2016-09-21 DIAGNOSIS — M6281 Muscle weakness (generalized): Secondary | ICD-10-CM

## 2016-09-21 DIAGNOSIS — M5412 Radiculopathy, cervical region: Secondary | ICD-10-CM

## 2016-09-21 DIAGNOSIS — M545 Low back pain: Secondary | ICD-10-CM | POA: Diagnosis not present

## 2016-09-21 DIAGNOSIS — M542 Cervicalgia: Secondary | ICD-10-CM

## 2016-09-21 DIAGNOSIS — R293 Abnormal posture: Secondary | ICD-10-CM

## 2016-09-21 NOTE — Telephone Encounter (Addendum)
Have sent a message to Neurology to check status, awaiting status update

## 2016-09-21 NOTE — Telephone Encounter (Signed)
Patient needs update on neurology referral please advise

## 2016-09-21 NOTE — Telephone Encounter (Signed)
Patient informed of PCP instruction. She has not yet gotten the neurology appt.  And concerned why taking so long.

## 2016-09-21 NOTE — Therapy (Signed)
[facility] [address]  [place], [place], [address] Phone: [phone]   Fax:  [phone]  Physical Therapy Treatment  Patient Details  Name: Margaret [name] MRN: [id number] Date of Birth: [birth year] Referring Provider: Dr. Karlton Lemon  Encounter Date: 09/21/2016      PT End of Session - 09/21/16 1326    Visit Number 7   Number of Visits 16   Date for PT Re-Evaluation 10/24/16   PT Start Time 1320  pt. arrived late    PT Stop Time 1414   PT Time Calculation (min) 54 min   Activity Tolerance Patient tolerated treatment well   Behavior During Therapy [facility] for tasks assessed/performed      Past Medical History:  Diagnosis Date  . Allergic state 03/25/2012  . Aortic calcification ([place]) 08/27/2014  . At risk for colon cancer    Last colonoscopy in [place] 03/2009 found hyperplastic polyp only  . Atherosclerosis of aorta ([place]) 04/12/2014  . GERD (gastroesophageal reflux disease)   . Hiatal hernia with gastroesophageal reflux 04/27/2010   Qualifier: Diagnosis of  By: Danelle Earthly CMA, Darlene  Failed Omeprazole and Protonix   . Hyperlipidemia   . Osteopenia 08/31/2012  . Peripheral edema 12/13/2014  . Sleep apnea   . Unspecified sinusitis (chronic) 03/30/2013    Past Surgical History:  Procedure Laterality Date  . [place] STUDY N/A 12/15/2015   Procedure: [place] STUDY;  Surgeon: Mauri Pole, MD;  Location: [facility];  Service: Endoscopy;  Laterality: N/A;  . DILATION AND CURETTAGE OF UTERUS    . ESOPHAGEAL MANOMETRY N/A 12/15/2015   Procedure: ESOPHAGEAL MANOMETRY (EM);  Surgeon: Mauri Pole, MD;  Location: [facility];  Service: Endoscopy;  Laterality: N/A;    There were no vitals filed for this visit.      Subjective Assessment - 09/21/16 1324    Subjective Pt. noting neck and back pain are both bothering her today.     Patient Stated Goals Improve pain, function   Currently in Pain?  Yes   Pain Score 4    Pain Location Neck   Pain Orientation Left   Pain Descriptors / Indicators Aching;Constant   Pain Type Acute pain   Pain Onset More than a month ago   Pain Frequency Intermittent   Aggravating Factors  sitting on couch with poor posture   Multiple Pain Sites Yes   Pain Score 4   Pain Location Back   Pain Orientation Mid;Lower   Pain Descriptors / Indicators Aching  "pressure"                         [facility] Adult PT Treatment/Exercise - 09/21/16 1347      Neck Exercises: Theraband   Horizontal ABduction 15 reps;Other (comment)   Horizontal ABduction Limitations at doorseal with yellow TB   scap. squeeze    Other Theraband Exercises B ER with scap. squeeze 5" x 15 reps with yellow TB      Lumbar Exercises: Stretches   Lower Trunk Rotation 2 reps;20 seconds   Lower Trunk Rotation Limitations bilateral      Lumbar Exercises: Supine   Bridge 15 reps     Moist Heat Therapy   Number Minutes Moist Heat 15 Minutes   Moist Heat Location Cervical     Electrical Stimulation   Electrical Stimulation Location B cervical paraspinals to B UT   Electrical Stimulation Action IFC  Electrical Stimulation Parameters to tolerance    Electrical Stimulation Goals Pain;Tone     Manual Therapy   Manual Therapy Soft tissue mobilization;Myofascial release   Manual therapy comments supine    Soft tissue mobilization STM to B UT, B LS, B cervical paraspinals   Myofascial Release manual trigger point release B UT     Neck Exercises: Stretches   Upper Trapezius Stretch 1 rep;30 seconds   Upper Trapezius Stretch Limitations manual   Levator Stretch 1 rep;30 seconds   Levator Stretch Limitations manual                  PT Short Term Goals - 09/01/16 0944      PT SHORT TERM GOAL #1   Title Patient to be independent with HEP (09/26/16)   Status On-going     PT SHORT TERM GOAL #2   Title Patient to demonstrate good posture and body mechanics  to reduced stressors placed on low back musculature for reduced pain (09/26/16)   Status On-going           PT Long Term Goals - 09/14/16 1817      PT LONG TERM GOAL #1   Title Patient to be independent with advanced HEP for both low back and cervical spine (10/24/16)   Status Revised     PT LONG TERM GOAL #2   Title Patient to report ability to ambulate for >/= 1 mile with no increase in back pain (10/24/16)   Status On-going     PT LONG TERM GOAL #3   Title Patient to report ability to perform transfers from various positions with no increase in pain (10/24/16)   Status On-going     PT LONG TERM GOAL #4   Title Patient to improve B LE by >/= 1 grade (10/24/16)   Status On-going     PT LONG TERM GOAL #5   Title Patient to report neck pain of </= 2/10 for greater than 2 weeks (10/24/16)   Status New     Additional Long Term Goals   Additional Long Term Goals Yes     PT LONG TERM GOAL #6   Title Patient to report no N&T into hands after sleeping for greater than 2 weeks (10/24/16)   Status New     PT LONG TERM GOAL #7   Title Patient to report ability to perform ADLs and driving without neck pain/tightness limiting function (10/24/16)   Status New               Plan - 09/21/16 1327    Clinical Impression Statement Pt. noting neck and back pain bothering her today.  Manual STM/TPR and gentle stretching continued to cervical and shoulder musculature today.  Pt. finding relief from this with a decrease in pain.  Gentle scapular strengthening and lumbar strengthening activity, which were tolerated well.  Pt. reports she is consistently performing HEP for back and neck now.  E-stim/moist heat to lumbar and cervical spine to end treatment to decrease tone and pain.   PT Treatment/Interventions ADLs/Self Care Home Management;Cryotherapy;Electrical Stimulation;Moist Heat;Traction;Ultrasound;Neuromuscular re-education;Balance training;Therapeutic exercise;Therapeutic  activities;Functional mobility training;Stair training;Gait training;Patient/family education;Manual techniques;Passive range of motion;Taping;Dry needling   PT Next Visit Plan Progress standing lumbopelvic stability activities per tolerance; include gentle cervical spine stretching and manual; modalities prn as indicated      Patient will benefit from skilled therapeutic intervention in order to improve the following deficits and impairments:  Decreased activity tolerance, Decreased range of motion,  Decreased mobility, Decreased strength, Pain, Difficulty walking, Postural dysfunction  Visit Diagnosis: Acute low back pain, unspecified back pain laterality, with sciatica presence unspecified  Abnormal posture  Muscle weakness (generalized)  Cervicalgia  Radiculopathy, cervical region     Problem List Patient Active Problem List   Diagnosis Date Noted  . Cervical radiculopathy   . 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 TIA (transient ischemic attack) 09/06/2016  . Right hand weakness   . Peripheral edema 12/13/2014  . Atherosclerosis of aorta ([place]) 04/12/2014  . Rash and nonspecific skin eruption 03/31/2014  . Joint stiffness 03/31/2014  . Dysuria 02/08/2014  . Essential hypertension 02/08/2014  . Atypical chest pain 01/21/2014  . Chest pain 07/27/2013  . Hyperglycemia 07/27/2013  . Preventative health care 03/30/2013  . Left shoulder pain 11/14/2012  . Osteopenia 08/31/2012  . Back pain 03/25/2012  . Knee pain 03/25/2012  . Allergic state 03/25/2012  . Helicobacter pylori infection 06/08/2010  . Hiatal hernia with gastroesophageal reflux 04/27/2010  . Hyperlipidemia, mixed 04/07/2010  . Sleep apnea 04/07/2010    Bess Harvest, PTA 09/21/16 7:08 PM   [facility] [address]  [place], [place], [address] Phone: [phone]   Fax:  [phone]  Name: Margaret [name] MRN: [id number] Date of Birth:  [birth year]

## 2016-09-25 ENCOUNTER — Ambulatory Visit: Payer: BLUE CROSS/BLUE SHIELD

## 2016-09-25 DIAGNOSIS — M542 Cervicalgia: Secondary | ICD-10-CM

## 2016-09-25 DIAGNOSIS — M6281 Muscle weakness (generalized): Secondary | ICD-10-CM

## 2016-09-25 DIAGNOSIS — M5412 Radiculopathy, cervical region: Secondary | ICD-10-CM

## 2016-09-25 DIAGNOSIS — R293 Abnormal posture: Secondary | ICD-10-CM

## 2016-09-25 DIAGNOSIS — M545 Low back pain: Secondary | ICD-10-CM

## 2016-09-25 NOTE — Therapy (Signed)
[facility] [address]  [place], [place], [address] Phone: [phone]   Fax:  [phone]  Physical Therapy Treatment  Patient Details  Name: Margaret [name] MRN: [id number] Date of Birth: 09-10-1950 Referring Provider: Dr. Karlton Lemon  Encounter Date: 09/25/2016      PT End of Session - 09/25/16 1325    Visit Number 8   Number of Visits 16   Date for PT Re-Evaluation 10/24/16   PT Start Time 1319  pt. arrived late    PT Stop Time 1423   PT Time Calculation (min) 64 min   Activity Tolerance Patient tolerated treatment well   Behavior During Therapy [facility] for tasks assessed/performed      Past Medical History:  Diagnosis Date  . Allergic state 03/25/2012  . Aortic calcification ([place]) 08/27/2014  . At risk for colon cancer    Last colonoscopy in [place] 03/2009 found hyperplastic polyp only  . Atherosclerosis of aorta ([place]) 04/12/2014  . GERD (gastroesophageal reflux disease)   . Hiatal hernia with gastroesophageal reflux 04/27/2010   Qualifier: Diagnosis of  By: Danelle Earthly CMA, Darlene  Failed Omeprazole and Protonix   . Hyperlipidemia   . Osteopenia 08/31/2012  . Peripheral edema 12/13/2014  . Sleep apnea   . Unspecified sinusitis (chronic) 03/30/2013    Past Surgical History:  Procedure Laterality Date  . [place] STUDY N/A 12/15/2015   Procedure: [place] STUDY;  Surgeon: Mauri Pole, MD;  [facility];  Service: Endoscopy;  Laterality: N/A;  . DILATION AND CURETTAGE OF UTERUS    . ESOPHAGEAL MANOMETRY N/A 12/15/2015   Procedure: ESOPHAGEAL MANOMETRY (EM);  Surgeon: Mauri Pole, MD;  [facility]: [facility];  Service: Endoscopy;  Laterality: N/A;    There were no vitals filed for this visit.      Subjective Assessment - 09/25/16 1323    Subjective Pt. notes she feels neck and back pain are improving and numbness and tingling is only present in UE in mornings.  Pt. trying new  pillow that she feels is helping.     Diagnostic tests MRI: Multilevel degenerative spondylolysis as detailed above with resultant mild canal stenosis at C4-5 and C5-6. Resultant multilevel bilateral foraminal narrowing. Most notable findings include moderate right C3 foraminal stenosis, severe right with moderate left C4 foraminal narrowing, moderate right C5 foraminal stenosis, and severe left C6 foraminal narrowing.    Patient Stated Goals Improve pain, function   Currently in Pain? Yes   Pain Score 3    Pain Location Neck   Pain Orientation Left   Pain Descriptors / Indicators Aching;Constant   Pain Type Acute pain   Pain Radiating Towards numbness into B hands   Pain Onset More than a month ago   Pain Frequency Intermittent   Multiple Pain Sites Yes   Pain Score 3   Pain Location Back   Pain Orientation Mid;Lower   Pain Descriptors / Indicators Aching                         [facility] Adult PT Treatment/Exercise - 09/25/16 1326      Lumbar Exercises: Stretches   Double Knee to Chest Stretch 5 reps;10 seconds   Double Knee to Chest Stretch Limitations Seated leaning onto green P-ball (84m) 3-direction lumbar stretch     Lumbar Exercises: Standing   Functional Squats 10 reps;3 seconds   Functional Squats Limitations minisquat at counter  Lumbar Exercises: Supine   Bridge 15 reps  5" hold   Bridge Limitations with sustained hip abd/ER with green TB      Lumbar Exercises: Sidelying   Clam 15 reps;3 seconds   Clam Limitations with green TB issued to      Lumbar Exercises: Prone   Other Prone Lumbar Exercises 3-way prone childs pose lumbar stretch x 30 sec each way     Moist Heat Therapy   Number Minutes Moist Heat 15 Minutes   Moist Heat Location Lumbar Spine     Electrical Stimulation   Electrical Stimulation Location lumbar spine   L sided paraspinals    Electrical Stimulation Action IFC    Electrical Stimulation Parameters to tolerance   Electrical  Stimulation Goals Pain;Tone     Manual Therapy   Manual Therapy Soft tissue mobilization;Myofascial release;Passive ROM   Manual therapy comments supine    Soft tissue mobilization STM to B UT, B LS, B cervical paraspinals   Myofascial Release manual trigger point release B UT   Passive ROM Cervical PROM and gentle stretch for B UT, B Levator Scap      Neck Exercises: Stretches   Upper Trapezius Stretch 1 rep;30 seconds   Upper Trapezius Stretch Limitations self-stretch; technique correction required   Levator Stretch 1 rep;30 seconds   Levator Stretch Limitations self-stretch; technique correction required   Other Neck Stretches --                PT Education - 09/25/16 1439    Education provided Yes   Education Details childs pose stretch, 3-way lumbar stretch seated with p-ball (pt. has p-ball at home), clam shell with green TB    Person(s) Educated Patient   Methods Explanation;Verbal cues;Handout   Comprehension Verbalized understanding;Returned demonstration;Verbal cues required;Need further instruction          PT Short Term Goals - 09/25/16 1410      PT SHORT TERM GOAL #1   Title Patient to be independent with HEP (09/26/16)   Status Achieved     PT SHORT TERM GOAL #2   Title Patient to demonstrate good posture and body mechanics to reduced stressors placed on low back musculature for reduced pain (09/26/16)   Status On-going           PT Long Term Goals - 09/14/16 1817      PT LONG TERM GOAL #1   Title Patient to be independent with advanced HEP for both low back and cervical spine (10/24/16)   Status Revised     PT LONG TERM GOAL #2   Title Patient to report ability to ambulate for >/= 1 mile with no increase in back pain (10/24/16)   Status On-going     PT LONG TERM GOAL #3   Title Patient to report ability to perform transfers from various positions with no increase in pain (10/24/16)   Status On-going     PT LONG TERM GOAL #4   Title Patient to  improve B LE by >/= 1 grade (10/24/16)   Status On-going     PT LONG TERM GOAL #5   Title Patient to report neck pain of </= 2/10 for greater than 2 weeks (10/24/16)   Status New     Additional Long Term Goals   Additional Long Term Goals Yes     PT LONG TERM GOAL #6   Title Patient to report no N&T into hands after sleeping for greater than 2 weeks (10/24/16)  Status New     PT LONG TERM GOAL #7   Title Patient to report ability to perform ADLs and driving without neck pain/tightness limiting function (10/24/16)   Status New               Plan - 09/25/16 1347    Clinical Impression Statement Pt. noting numbness and tingling into B hands is only present in mornings then goes away once moving.  Pt. feels neck and back pain has improved over weekend and stretches help manage pain.  Pt. reports performing HEP activities 1x/day consistently.  Today's treatment focusing on manual stretching/STM to cervical musculature and gentle stretching and strengthening to lumbar spine.  Pt. tolerating all activities well however did have some increased back pain with mini squat today.  HEP updated to include lumbar stretching and hip strengthening activities.  Pt. ending treatment pain free at neck however with some back pain thus E-stim/moist heat to lumbar spine to end treatment.     PT Treatment/Interventions ADLs/Self Care Home Management;Cryotherapy;Electrical Stimulation;Moist Heat;Traction;Ultrasound;Neuromuscular re-education;Balance training;Therapeutic exercise;Therapeutic activities;Functional mobility training;Stair training;Gait training;Patient/family education;Manual techniques;Passive range of motion;Taping;Dry needling   PT Next Visit Plan Monitor response to updated HEP; Progress to more standing lumbopelvic strengthening as able; include gentle cervical spine stretching and manual; modalities prn as indicated      Patient will benefit from skilled therapeutic intervention in order to  improve the following deficits and impairments:  Decreased activity tolerance, Decreased range of motion, Decreased mobility, Decreased strength, Pain, Difficulty walking, Postural dysfunction  Visit Diagnosis: Acute low back pain, unspecified back pain laterality, with sciatica presence unspecified  Abnormal posture  Muscle weakness (generalized)  Cervicalgia  Radiculopathy, cervical region     Problem List Patient Active Problem List   Diagnosis Date Noted  . Cervical radiculopathy   . 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 TIA (transient ischemic attack) 09/06/2016  . Right hand weakness   . Peripheral edema 12/13/2014  . Atherosclerosis of aorta ([place]) 04/12/2014  . Rash and nonspecific skin eruption 03/31/2014  . Joint stiffness 03/31/2014  . Dysuria 02/08/2014  . Essential hypertension 02/08/2014  . Atypical chest pain 01/21/2014  . Chest pain 07/27/2013  . Hyperglycemia 07/27/2013  . Preventative health care 03/30/2013  . Left shoulder pain 11/14/2012  . Osteopenia 08/31/2012  . Back pain 03/25/2012  . Knee pain 03/25/2012  . Allergic state 03/25/2012  . Helicobacter pylori infection 06/08/2010  . Hiatal hernia with gastroesophageal reflux 04/27/2010  . Hyperlipidemia, mixed 04/07/2010  . Sleep apnea 04/07/2010    Bess Harvest, PTA 09/25/16 2:46 PM  [facility] [address]  [place], [place], [address] Phone: [phone]   Fax:  [phone]  Name: Margaret [name] MRN: [id number] Date of Birth: 30-Jan-1951

## 2016-09-28 ENCOUNTER — Ambulatory Visit: Payer: BLUE CROSS/BLUE SHIELD | Attending: Family Medicine

## 2016-09-28 DIAGNOSIS — M545 Low back pain: Secondary | ICD-10-CM | POA: Diagnosis not present

## 2016-09-28 DIAGNOSIS — M6281 Muscle weakness (generalized): Secondary | ICD-10-CM | POA: Diagnosis present

## 2016-09-28 DIAGNOSIS — R293 Abnormal posture: Secondary | ICD-10-CM | POA: Diagnosis present

## 2016-09-28 DIAGNOSIS — M5412 Radiculopathy, cervical region: Secondary | ICD-10-CM | POA: Diagnosis present

## 2016-09-28 DIAGNOSIS — M542 Cervicalgia: Secondary | ICD-10-CM | POA: Diagnosis present

## 2016-09-28 NOTE — Therapy (Signed)
[facility] [address]  [place], [place], [address] Phone: [phone]   Fax:  [phone]  Physical Therapy Treatment  Patient Details  Name: Margaret [name] MRN: [id number] Date of Birth: 04-Sep-1950 Referring Provider: Dr. Karlton Lemon  Encounter Date: 09/28/2016      PT End of Session - 09/28/16 1335    Visit Number 9   Number of Visits 16   Date for PT Re-Evaluation 10/24/16   PT Start Time 4268  pt. arrived late    PT Stop Time 1420   PT Time Calculation (min) 61 min   Activity Tolerance Patient tolerated treatment well   Behavior During Therapy [facility] for tasks assessed/performed      Past Medical History:  Diagnosis Date  . Allergic state 03/25/2012  . Aortic calcification ([place]) 08/27/2014  . At risk for colon cancer    Last colonoscopy in [place] 03/2009 found hyperplastic polyp only  . Atherosclerosis of aorta ([place]) 04/12/2014  . GERD (gastroesophageal reflux disease)   . Hiatal hernia with gastroesophageal reflux 04/27/2010   Qualifier: Diagnosis of  By: Danelle Earthly CMA, Darlene  Failed Omeprazole and Protonix   . Hyperlipidemia   . Osteopenia 08/31/2012  . Peripheral edema 12/13/2014  . Sleep apnea   . Unspecified sinusitis (chronic) 03/30/2013    Past Surgical History:  Procedure Laterality Date  . [place] STUDY N/A 12/15/2015   Procedure: [place] STUDY;  Surgeon: Mauri Pole, MD;  Location: [facility];  Service: Endoscopy;  Laterality: N/A;  . DILATION AND CURETTAGE OF UTERUS    . ESOPHAGEAL MANOMETRY N/A 12/15/2015   Procedure: ESOPHAGEAL MANOMETRY (EM);  Surgeon: Mauri Pole, MD;  Location: [facility];  Service: Endoscopy;  Laterality: N/A;    There were no vitals filed for this visit.      Subjective Assessment - 09/28/16 1324    Subjective Pt. noting neck is still feeling better.  Some hand numbness in mornings still.  pt. noting this numbness is more of a "stiffness".      Patient Stated Goals Improve pain, function   Currently in Pain? Yes   Pain Score 2    Pain Location Back   Pain Orientation Left   Pain Descriptors / Indicators --  pulling    Pain Onset More than a month ago   Pain Frequency Intermittent   Multiple Pain Sites No                         [facility] Adult PT Treatment/Exercise - 09/28/16 1333      Lumbar Exercises: Stretches   Single Knee to Chest Stretch 2 reps;30 seconds   Single Knee to Chest Stretch Limitations bil; self-stretch    Double Knee to Chest Stretch 5 reps;10 seconds   Double Knee to Chest Stretch Limitations Seated leaning onto green P-ball (39m 3-direction lumbar stretch     Lumbar Exercises: Aerobic   Stationary Bike Bike: lvl 2, 7 min      Lumbar Exercises: Standing   Other Standing Lumbar Exercises Side stepping with green TB 2 x 20 ft; good tolerance      Lumbar Exercises: Seated   Sit to Stand 15 reps  2 sets; 2nd set from box with airex x 10    Sit to Stand Limitations No UE pushoff      Lumbar Exercises: Supine   Bridge 20 reps   Bridge Limitations with sustained  hip abd/ER with green TB      Lumbar Exercises: Prone   Other Prone Lumbar Exercises 3-way prone childs pose lumbar stretch x 30 sec each way  cues to sit back on heels      Lumbar Exercises: Quadruped   Straight Leg Raise 10 reps   Straight Leg Raises Limitations quadruped on peanut p-ball      Moist Heat Therapy   Number Minutes Moist Heat 15 Minutes   Moist Heat Location Lumbar Spine     Electrical Stimulation   Electrical Stimulation Location lumbar spine    Electrical Stimulation Action IFC   15'   Electrical Stimulation Parameters to tolerance    Electrical Stimulation Goals Pain;Tone                PT Education - 09/28/16 1420    Education provided Yes   Education Details Sit<>stand without pushoff    Person(s) Educated Patient   Methods Explanation;Handout;Demonstration;Verbal cues    Comprehension Verbalized understanding;Returned demonstration;Verbal cues required;Need further instruction          PT Short Term Goals - 09/25/16 1410      PT SHORT TERM GOAL #1   Title Patient to be independent with HEP (09/26/16)   Status Achieved     PT SHORT TERM GOAL #2   Title Patient to demonstrate good posture and body mechanics to reduced stressors placed on low back musculature for reduced pain (09/26/16)   Status On-going           PT Long Term Goals - 09/28/16 1339      PT LONG TERM GOAL #1   Title Patient to be independent with advanced HEP for both low back and cervical spine (10/24/16)   Status Partially Met  5.3.18: met for current with good recall     PT LONG TERM GOAL #2   Title Patient to report ability to ambulate for >/= 1 mile with no increase in back pain (10/24/16)   Status On-going     PT LONG TERM GOAL #3   Title Patient to report ability to perform transfers from various positions with no increase in pain (10/24/16)   Status On-going     PT LONG TERM GOAL #4   Title Patient to improve B LE by >/= 1 grade (10/24/16)   Status On-going     PT LONG TERM GOAL #5   Title Patient to report neck pain of </= 2/10 for greater than 2 weeks (10/24/16)   Status On-going     PT LONG TERM GOAL #6   Title Patient to report no N&T into hands after sleeping for greater than 2 weeks (10/24/16)   Status On-going     PT LONG TERM GOAL #7   Title Patient to report ability to perform ADLs and driving without neck pain/tightness limiting function (10/24/16)   Status On-going               Plan - 09/28/16 1335    Clinical Impression Statement Pt. noting neck has not given her pain since last week.  Some numbness in hands first thing in mornings, which goes away as soon as she starts moving around.  Pt. still with "pulling" pain in low back following >1hr of standing/walking while shopping.  Treatment today focused on standing lumbopelvic strengthening activity  with addition of sit<>stand from low surfaces, and sit stepping with band.  Activities well tolerated today thus HEP updated to include sit<>stand.  Pt. still reporting, "  pulling" sensation in low back to start which remained unchanged with therex.  Pt. now reporting consistent adherence to HEP with good recall and verbalizes she is able to use these activities to manage pain.  E-stim/moist heat to lumbar spine to end treatment to decrease post-exercise pain and tone.     PT Treatment/Interventions ADLs/Self Care Home Management;Cryotherapy;Electrical Stimulation;Moist Heat;Traction;Ultrasound;Neuromuscular re-education;Balance training;Therapeutic exercise;Therapeutic activities;Functional mobility training;Stair training;Gait training;Patient/family education;Manual techniques;Passive range of motion;Taping;Dry needling   PT Next Visit Plan Update/alter HEP prn (adding side stepping possibly); Monitor response to standing therex; Progress to more standing lumbopelvic strengthening as able; include gentle cervical spine stretching and manual; modalities prn as indicated      Patient will benefit from skilled therapeutic intervention in order to improve the following deficits and impairments:  Decreased activity tolerance, Decreased range of motion, Decreased mobility, Decreased strength, Pain, Difficulty walking, Postural dysfunction  Visit Diagnosis: Acute low back pain, unspecified back pain laterality, with sciatica presence unspecified  Abnormal posture  Muscle weakness (generalized)  Cervicalgia  Radiculopathy, cervical region     Problem List Patient Active Problem List   Diagnosis Date Noted  . Cervical radiculopathy   . 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 TIA (transient ischemic attack) 09/06/2016  . Right hand weakness   . Peripheral edema 12/13/2014  . Atherosclerosis of aorta ([place]) 04/12/2014  . Rash and nonspecific skin eruption 03/31/2014  . Joint stiffness 03/31/2014   . Dysuria 02/08/2014  . Essential hypertension 02/08/2014  . Atypical chest pain 01/21/2014  . Chest pain 07/27/2013  . Hyperglycemia 07/27/2013  . Preventative health care 03/30/2013  . Left shoulder pain 11/14/2012  . Osteopenia 08/31/2012  . Back pain 03/25/2012  . Knee pain 03/25/2012  . Allergic state 03/25/2012  . Helicobacter pylori infection 06/08/2010  . Hiatal hernia with gastroesophageal reflux 04/27/2010  . Hyperlipidemia, mixed 04/07/2010  . Sleep apnea 04/07/2010    Bess Harvest, PTA 09/28/16 2:36 PM  [facility] [address]  [place], [place], [address] Phone: [phone]   Fax:  [phone]  Name: Margaret [name] MRN: [id number] Date of Birth: 1951-05-14

## 2016-10-02 ENCOUNTER — Ambulatory Visit: Payer: BLUE CROSS/BLUE SHIELD

## 2016-10-02 DIAGNOSIS — M545 Low back pain: Secondary | ICD-10-CM | POA: Diagnosis not present

## 2016-10-02 DIAGNOSIS — M6281 Muscle weakness (generalized): Secondary | ICD-10-CM

## 2016-10-02 DIAGNOSIS — R293 Abnormal posture: Secondary | ICD-10-CM

## 2016-10-02 DIAGNOSIS — M542 Cervicalgia: Secondary | ICD-10-CM

## 2016-10-02 DIAGNOSIS — M5412 Radiculopathy, cervical region: Secondary | ICD-10-CM

## 2016-10-02 NOTE — Patient Instructions (Signed)

## 2016-10-02 NOTE — Therapy (Addendum)
[facility] [address]  [place], [place], [address] Phone: [phone]   Fax:  [phone]  Physical Therapy Treatment  Patient Details  Name: Margaret [name] MRN: [id number] Date of Birth: 28-Oct-1950 Referring Provider: Dr. Karlton Lemon  Encounter Date: 10/02/2016      PT End of Session - 10/02/16 1457    Visit Number 10   Number of Visits 16   Date for PT Re-Evaluation 10/24/16   PT Start Time 7867   PT Stop Time 1533   PT Time Calculation (min) 46 min   Activity Tolerance Patient tolerated treatment well   Behavior During Therapy [facility] for tasks assessed/performed      Past Medical History:  Diagnosis Date  . Allergic state 03/25/2012  . Aortic calcification ([place]) 08/27/2014  . At risk for colon cancer    Last colonoscopy in [place] 03/2009 found hyperplastic polyp only  . Atherosclerosis of aorta ([place]) 04/12/2014  . GERD (gastroesophageal reflux disease)   . Hiatal hernia with gastroesophageal reflux 04/27/2010   Qualifier: Diagnosis of  By: Danelle Earthly CMA, Darlene  Failed Omeprazole and Protonix   . Hyperlipidemia   . Osteopenia 08/31/2012  . Peripheral edema 12/13/2014  . Sleep apnea   . Unspecified sinusitis (chronic) 03/30/2013    Past Surgical History:  Procedure Laterality Date  . [place] STUDY N/A 12/15/2015   Procedure: [place] STUDY;  Surgeon: Mauri Pole, MD;  [facility];  Service: Endoscopy;  Laterality: N/A;  . DILATION AND CURETTAGE OF UTERUS    . ESOPHAGEAL MANOMETRY N/A 12/15/2015   Procedure: ESOPHAGEAL MANOMETRY (EM);  Surgeon: Mauri Pole, MD;  [facility]: [facility];  Service: Endoscopy;  Laterality: N/A;    There were no vitals filed for this visit.      Subjective Assessment - 10/02/16 1451    Subjective Pt. reporting she feels the neck and back are still improving.  Pt. pain free today.  Pt. still having occasional neck pain with prolonged sitting  watching TV   Patient Stated Goals Improve pain, function   Currently in Pain? No/denies   Multiple Pain Sites No            [facility] PT Assessment - 10/02/16 1518      Strength   Strength Assessment Site Hip   Right/Left Hip Right;Left   Right Hip Flexion 4/5   Right Hip Extension 4/5   Right Hip ABduction 4/5   Right Hip ADduction 4+/5   Left Hip Flexion 4/5   Left Hip Extension 4/5   Left Hip ABduction 4/5   Left Hip ADduction 4+/5   Right/Left Knee Right;Left   Right Knee Flexion 4/5   Right Knee Extension 4+/5   Left Knee Flexion 4/5   Left Knee Extension 4+/5   Right/Left Ankle Right;Left   Right Ankle Dorsiflexion 4+/5   Left Ankle Dorsiflexion 4+/5                     [facility] Adult PT Treatment/Exercise - 10/02/16 1455      Self-Care   Self-Care Other Self-Care Comments   Other Self-Care Comments  education and discussion of proper body mechanics with household chores such as vacuuming, sweeping, and review of proper sitting and standing posture; reviewed due to pt. reporting pain still with vacuuming, prolonged sitting watching TV, and sweeping     Neck Exercises: Machines for Strengthening   UBE (Upper Arm Bike) UBE:  lvl 4.0, 3 min each     Lumbar Exercises: Stretches   Single Knee to Chest Stretch 2 reps;30 seconds   Single Knee to Chest Stretch Limitations bil; self-stretch    Double Knee to Chest Stretch 5 reps;10 seconds   Double Knee to Chest Stretch Limitations Seated leaning onto green P-ball (15m 3-direction lumbar stretch     Lumbar Exercises: Machines for Strengthening   Other Lumbar Machine Exercise BATCA low row 20# x 15 reps      Lumbar Exercises: Standing   Functional Squats 3 seconds;15 reps   Functional Squats Limitations minisquat at counter     Lumbar Exercises: Seated   Sit to Stand 10 reps  2 sets    Sit to Stand Limitations No UE pushoff; from box with airex; 2nd set box without airex                 PT  Education - 10/02/16 1544    Education provided Yes   Education Details Counter squat, Posture and body mechanics handout    Person(s) Educated Patient   Methods Explanation;Demonstration;Verbal cues;Handout   Comprehension Verbalized understanding;Returned demonstration;Verbal cues required;Need further instruction          PT Short Term Goals - 10/02/16 1604      PT SHORT TERM GOAL #1   Title Patient to be independent with HEP (09/26/16)   Status Achieved     PT SHORT TERM GOAL #2   Title Patient to demonstrate good posture and body mechanics to reduced stressors placed on low back musculature for reduced pain (09/26/16)   Status Achieved           PT Long Term Goals - 10/02/16 1459      PT LONG TERM GOAL #1   Title Patient to be independent with advanced HEP for both low back and cervical spine (10/24/16)   Status Partially Met  5.3.18: met for current with good recall     PT LONG TERM GOAL #2   Title Patient to report ability to ambulate for >/= 1 mile with no increase in back pain (10/24/16)   Status On-going  5.7.18: pt. not yet attempted     PT LONG TERM GOAL #3   Title Patient to report ability to perform transfers from various positions with no increase in pain (10/24/16)   Status Achieved     PT LONG TERM GOAL #4   Title Patient to improve B LE by >/= 1 grade (10/24/16)   Status Partially Met  5.7.18: grossly a half muscle grade improvement      PT LONG TERM GOAL #5   Title Patient to report neck pain of </= 2/10 for greater than 2 weeks (10/24/16)   Status Achieved     PT LONG TERM GOAL #6   Title Patient to report no N&T into hands after sleeping for greater than 2 weeks (10/24/16)   Status Partially Met  5.7.18: pt. reporting she has not had N&T in hands in a week and at that point it lasted 5 min upon waking in mornings.       PT LONG TERM GOAL #7   Title Patient to report ability to perform ADLs and driving without neck pain/tightness limiting function  (10/24/16)   Status Partially Met  5.7.18: still some back pain free with vacuuming                Plan - 10/02/16 1545    Clinical Impression Statement Pt.  reporting she feels like she is still getting better and is now able to manage occasional neck and back pain with HEP stretches.  Some pain still in neck and back while sitting watching TV for prolonged periods however this is relieved when she changes positions.  Pt. improving LE strength grossly 1/2 grade with strength testing today.  Pt. noting she has not had significant neck pain in two weeks and no longer has back pain when transferring from surfaces at home.  Pt. noting she has not felt N&T in hands in mornings in one week. Pt. still having occasional back, "pulling" pain with vacuuming and sweeping thus proper body mechanics with these tasks reviewed with pt.  HEP updated with counter squat with pt. able to perform pain free.  Pt. has not yet attempted walking 1 mile in neighborhood and was instructed to attempt this before next treatment.  Pt. not to return to therapy until 5.17.18 due to being gone for vacation.  Discussion today with pt. regarding continue need for therapy with pt. verbalizing she will be comfortable transitioning to home program following next visit and review of HEP.  Will plan to review and update HEP prn next visit and address pt. status for continued need for therapy.     PT Treatment/Interventions ADLs/Self Care Home Management;Cryotherapy;Electrical Stimulation;Moist Heat;Traction;Ultrasound;Neuromuscular re-education;Balance training;Therapeutic exercise;Therapeutic activities;Functional mobility training;Stair training;Gait training;Patient/family education;Manual techniques;Passive range of motion;Taping;Dry needling   PT Next Visit Plan Update/alter HEP prn; address pt. status and need for further therapy; monitor tolerance to walking mile in neighborhood       Patient will benefit from skilled  therapeutic intervention in order to improve the following deficits and impairments:  Decreased activity tolerance, Decreased range of motion, Decreased mobility, Decreased strength, Pain, Difficulty walking, Postural dysfunction  Visit Diagnosis: Acute low back pain, unspecified back pain laterality, with sciatica presence unspecified  Abnormal posture  Muscle weakness (generalized)  Cervicalgia  Radiculopathy, cervical region     Problem List Patient Active Problem List   Diagnosis Date Noted  . Cervical radiculopathy   . 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 TIA (transient ischemic attack) 09/06/2016  . Right hand weakness   . Peripheral edema 12/13/2014  . Atherosclerosis of aorta ([place]) 04/12/2014  . Rash and nonspecific skin eruption 03/31/2014  . Joint stiffness 03/31/2014  . Dysuria 02/08/2014  . Essential hypertension 02/08/2014  . Atypical chest pain 01/21/2014  . Chest pain 07/27/2013  . Hyperglycemia 07/27/2013  . Preventative health care 03/30/2013  . Left shoulder pain 11/14/2012  . Osteopenia 08/31/2012  . Back pain 03/25/2012  . Knee pain 03/25/2012  . Allergic state 03/25/2012  . Helicobacter pylori infection 06/08/2010  . Hiatal hernia with gastroesophageal reflux 04/27/2010  . Hyperlipidemia, mixed 04/07/2010  . Sleep apnea 04/07/2010    Bess Harvest, PTA 10/02/16 4:06 PM   PHYSICAL THERAPY DISCHARGE SUMMARY  Visits from Start of Care: 10  Current functional level related to goals / functional outcomes: See above   Remaining deficits: See above   Education / Equipment: HEP  Plan: Patient agrees to discharge.  Patient goals were partially met. Patient is being discharged due to not returning since the last visit.  ?????    Lanney Gins, PT, DPT 12/18/16 9:54 AM   [facility] [address]  [place], [place], [address] Phone: [phone]   Fax:   [phone]  Name: Dakiya Puopolo MRN: [id number] Date of Birth: 1950/10/21

## 2016-10-10 ENCOUNTER — Ambulatory Visit (INDEPENDENT_AMBULATORY_CARE_PROVIDER_SITE_OTHER): Payer: BLUE CROSS/BLUE SHIELD | Admitting: Diagnostic Neuroimaging

## 2016-10-10 ENCOUNTER — Encounter: Payer: Self-pay | Admitting: Diagnostic Neuroimaging

## 2016-10-10 VITALS — BP 135/64 | HR 61 | Ht 62.0 in | Wt 176.4 lb

## 2016-10-10 DIAGNOSIS — M19041 Primary osteoarthritis, right hand: Secondary | ICD-10-CM | POA: Diagnosis not present

## 2016-10-10 DIAGNOSIS — R200 Anesthesia of skin: Secondary | ICD-10-CM

## 2016-10-10 DIAGNOSIS — R29898 Other symptoms and signs involving the musculoskeletal system: Secondary | ICD-10-CM

## 2016-10-10 NOTE — Patient Instructions (Signed)

## 2016-10-10 NOTE — Progress Notes (Signed)
[facility]  PATIENT: Margaret [name] DOB: [birth year]  REFERRING CLINICIAN: Frederik Pear HISTORY FROM: patient  REASON FOR VISIT: new consult    HISTORICAL  CHIEF COMPLAINT:  Chief Complaint  Patient presents with  . Radiculopathy    rm 7, New Pt, "numbness in my hands x 2 months"    HISTORY OF PRESENT ILLNESS:   66 year old right-handed female with hypercholesterolemia, here for evaluation of right hand stiffness, weakness, numbness. 09/05/16 patient had onset of right hand stiffness, "frozen", hand locked up feeling with some weakness and numbness. Patient massaged her hand and was gradually able to loosen hand. However she was having some heartburn/chest pain symptoms at the same time. Patient went to the hospital for further evaluation was admitted for stroke evaluation. MRI was negative for stroke. Stroke risk factor evaluation was completed. Patient was discharged on aspirin and cholesterol management. MRI cervical spine was obtained, which showed some degenerative changes, raising possibility of cervical radiculopathy. EMG nerve conduction was recommended to be considered as an outpatient basis.  Since discharge patient has had no recurrence of symptoms. Her bilateral hands and arms feel normal. No recurrence of "frozen" sensation in the right hand. No recurrence of weakness or numbness.   REVIEW OF SYSTEMS: Full 14 system review of systems performed and negative with exception of: Allergies runny nose snoring swelling in legs easy bruising joint pain headache.   ALLERGIES: No Known Allergies  HOME MEDICATIONS: Outpatient Medications Prior to Visit  Medication Sig Dispense Refill  . aspirin EC 325 MG EC tablet Take 1 tablet (325 mg total) by mouth daily. 30 tablet 0  . fish oil-omega-3 fatty acids 1000 MG capsule Take 2 g by mouth daily.    . Multiple Vitamin (MULTIVITAMIN WITH MINERALS) TABS tablet Take 1 tablet by mouth daily.    Marland Kitchen omeprazole (PRILOSEC)  20 MG capsule Take 1 capsule (20 mg total) by mouth daily as needed. 60 capsule 0  . rosuvastatin (CRESTOR) 10 MG tablet Take 1 tablet (10 mg total) by mouth daily. 30 tablet 6   No facility-administered medications prior to visit.     PAST MEDICAL HISTORY: Past Medical History:  Diagnosis Date  . Allergic state 03/25/2012  . Aortic calcification ([place]) 08/27/2014  . At risk for colon cancer    Last colonoscopy in [place] 03/2009 found hyperplastic polyp only  . Atherosclerosis of aorta ([place]) 04/12/2014  . GERD (gastroesophageal reflux disease)   . Hiatal hernia with gastroesophageal reflux 04/27/2010   Qualifier: Diagnosis of  By: Danelle Earthly CMA, Darlene  Failed Omeprazole and Protonix   . Hyperlipidemia   . Osteopenia 08/31/2012  . Peripheral edema 12/13/2014  . Sleep apnea   . Unspecified sinusitis (chronic) 03/30/2013    PAST SURGICAL HISTORY: Past Surgical History:  Procedure Laterality Date  . [place] STUDY N/A 12/15/2015   Procedure: [place] STUDY;  Surgeon: Mauri Pole, MD;  Location: [facility];  Service: Endoscopy;  Laterality: N/A;  . DILATION AND CURETTAGE OF UTERUS    . ESOPHAGEAL MANOMETRY N/A 12/15/2015   Procedure: ESOPHAGEAL MANOMETRY (EM);  Surgeon: Mauri Pole, MD;  Location: [facility];  Service: Endoscopy;  Laterality: N/A;    FAMILY HISTORY: Family History  Problem Relation Age of Onset  . Stroke Mother   . Lung cancer Father   . Stroke Sister        59  . Thyroid disease Sister   . Diabetes Sister   . Hypertension Sister   .  Hyperlipidemia Sister   . Hypertension Sister   . Sleep apnea Son   . Obesity Son     SOCIAL HISTORY:  Social History   Social History  . Marital status: Married    Spouse name: [name]  . Number of children: 2  . Years of education: 16   Occupational History  . Accountant     retired   Social History Main Topics  . Smoking status: Never Smoker  . Smokeless tobacco: Never Used  . Alcohol use 0.0  oz/week     Comment: Rarely  . Drug use: No  . Sexual activity: Not on file     Comment: no dietary restrictions, lives with husand, works at [employer]   Other Topics Concern  . Not on file   Social History Narrative   Lives with husband   Caffeine- tea, 2 cups daily     PHYSICAL EXAM  GENERAL EXAM/CONSTITUTIONAL: Vitals:  Vitals:   10/10/16 1424  BP: 135/64  Pulse: 61  Weight: 176 lb 6.4 oz (80 kg)  Height: 5\' 2"  (1.575 m)     Body mass index is 32.26 kg/m.  Visual Acuity Screening   Right eye Left eye Both eyes  Without correction: 20/40 20/40   With correction:        Patient is in no distress; well developed, nourished and groomed; neck is supple  CARDIOVASCULAR:  Examination of carotid arteries is normal; no carotid bruits  Regular rate and rhythm, no murmurs  Examination of peripheral vascular system by observation and palpation is normal  EYES:  Ophthalmoscopic exam of optic discs and posterior segments is normal; no papilledema or hemorrhages  MUSCULOSKELETAL:  Gait, strength, tone, movements noted in Neurologic exam below  NEUROLOGIC: MENTAL STATUS:  No flowsheet data found.  awake, alert, oriented to person, place and time  recent and remote memory intact  normal attention and concentration  language fluent, comprehension intact, naming intact,   fund of knowledge appropriate  CRANIAL NERVE:   2nd - no papilledema on fundoscopic exam  2nd, 3rd, 4th, 6th - pupils equal and reactive to light, visual fields full to confrontation, extraocular muscles intact, no nystagmus  5th - facial sensation symmetric  7th - facial strength symmetric  8th - hearing intact  9th - palate elevates symmetrically, uvula midline  11th - shoulder shrug symmetric  12th - tongue protrusion midline  MOTOR:   normal bulk and tone, full strength in the BUE, BLE  SENSORY:   normal and symmetric to light touch, temperature, vibration  COORDINATION:     finger-nose-finger, fine finger movements normal  REFLEXES:   deep tendon reflexes present and symmetric  GAIT/STATION:   narrow based gait    DIAGNOSTIC DATA (LABS, IMAGING, TESTING) - I reviewed patient records, labs, notes, testing and imaging myself where available.  Lab Results  Component Value Date   WBC 6.1 09/06/2016   HGB 13.1 09/06/2016   HCT 39.6 09/06/2016   MCV 86.1 09/06/2016   PLT 205 09/06/2016      Component Value Date/Time   NA 142 09/06/2016 0606   K 3.8 09/06/2016 0606   CL 104 09/06/2016 0606   CO2 29 09/06/2016 0606   GLUCOSE 108 (H) 09/06/2016 0606   BUN 8 09/06/2016 0606   CREATININE 0.64 09/06/2016 0606   CREATININE 0.78 07/21/2016 1627   CALCIUM 9.5 09/06/2016 0606   PROT 6.5 09/06/2016 0606   ALBUMIN 3.8 09/06/2016 0606   AST 23 09/06/2016 0606  ALT 31 09/06/2016 0606   ALKPHOS 72 09/06/2016 0606   BILITOT 0.6 09/06/2016 0606   GFRNONAA >60 09/06/2016 0606   GFRAA >60 09/06/2016 0606   Lab Results  Component Value Date   CHOL 150 09/06/2016   HDL 49 09/06/2016   LDLCALC 80 09/06/2016   TRIG 106 09/06/2016   CHOLHDL 3.1 09/06/2016   Lab Results  Component Value Date   HGBA1C 6.0 (H) 09/06/2016   No results found for: [id number] Lab Results  Component Value Date   TSH 2.06 07/21/2016    09/06/16 MRI / MRA brain [I reviewed images myself and agree with interpretation. -VRP]  1. Negative brain MRI.  No acute finding, including infarct. 2. Atherosclerotic change along the carotid siphons. No flow limiting stenosis or acute arterial finding. 3. 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  09/06/16 MRI cervical [I reviewed images myself and agree with interpretation. -VRP]  1. Multilevel degenerative spondylolysis as detailed above with resultant mild canal stenosis at C4-5 and C5-6. 2. Resultant multilevel bilateral foraminal narrowing. Most notable findings include moderate right C3 foraminal stenosis, severe  right with moderate left C4 foraminal narrowing, moderate right C5 foraminal stenosis, and severe left C6 foraminal narrowing. 3. 5.0 x 2.5 cm ovoid mass within the subcutaneous fat of the left upper back, incompletely visualized. Further evaluation with dedicated MRI, with and without contrast, suggested for complete evaluation (MRI neck and/or chest with attention to include this lesion could be performed).      ASSESSMENT AND PLAN  66 y.o. year old female here with admission in April 2018 for right hand stiffness, weakness, numbness, and completed TIA workup. No recurrent symptoms. Neurologic exam today is normal.    Dx:  1. Weakness of right hand   2. Numbness of right hand   3. Arthritis of right hand      PLAN: - continue stroke prevention with aspirin, statin, fish oil - no role for emg/ncs at this time for transient right hand stiffness / weakness issue, which has since resolved - monitor and conservative mgmt of cervical spine degeneration at this time; no clinical evidence of cervical radiculopathy at this time  Return if symptoms worsen or fail to improve, for return to PCP.    Penni Bombard, MD 8/36/6294, 7:65 PM Certified in Neurology, Neurophysiology and Neuroimaging  [facility] [address], [place], [place] [address] [phone]

## 2016-10-12 ENCOUNTER — Ambulatory Visit: Payer: BLUE CROSS/BLUE SHIELD | Admitting: Physical Therapy

## 2016-10-18 ENCOUNTER — Encounter: Payer: Self-pay | Admitting: Gastroenterology

## 2016-10-18 ENCOUNTER — Ambulatory Visit: Payer: BLUE CROSS/BLUE SHIELD | Admitting: Gastroenterology

## 2016-10-18 ENCOUNTER — Ambulatory Visit (INDEPENDENT_AMBULATORY_CARE_PROVIDER_SITE_OTHER): Payer: BLUE CROSS/BLUE SHIELD | Admitting: Gastroenterology

## 2016-10-18 VITALS — BP 108/64 | HR 66 | Ht 62.0 in | Wt 175.6 lb

## 2016-10-18 DIAGNOSIS — R109 Unspecified abdominal pain: Secondary | ICD-10-CM

## 2016-10-18 DIAGNOSIS — R110 Nausea: Secondary | ICD-10-CM | POA: Diagnosis not present

## 2016-10-18 DIAGNOSIS — K589 Irritable bowel syndrome without diarrhea: Secondary | ICD-10-CM

## 2016-10-18 DIAGNOSIS — R140 Abdominal distension (gaseous): Secondary | ICD-10-CM

## 2016-10-18 DIAGNOSIS — R12 Heartburn: Secondary | ICD-10-CM | POA: Diagnosis not present

## 2016-10-18 DIAGNOSIS — Z86010 Personal history of colonic polyps: Secondary | ICD-10-CM | POA: Diagnosis not present

## 2016-10-18 MED ORDER — ONDANSETRON HCL 4 MG PO TABS: 4.0000 mg | ORAL_TABLET | Freq: Three times a day (TID) | ORAL | 0 refills | Status: DC | PRN

## 2016-10-18 NOTE — Patient Instructions (Addendum)
Use VSL # 3 112 billion units daily  Use IB Gard 1 capsule three times a day as needed  We will send Zofran 4 mg to your pharmacy  Gastroparesis diet given  Eat frequent small meals  Low Fiber diet  Low fat diet       Low  Fat  Diet Getting too much fat and cholesterol in your diet may cause health problems. Following this diet helps keep your fat and cholesterol at normal levels. This can keep you from getting sick. What types of fat should I choose?  Choose monosaturated and polyunsaturated fats. These are found in foods such as olive oil, canola oil, flaxseeds, walnuts, almonds, and seeds.  Eat more omega-3 fats. Good choices include salmon, mackerel, sardines, tuna, flaxseed oil, and ground flaxseeds.  Limit saturated fats. These are in animal products such as meats, butter, and cream. They can also be in plant products such as palm oil, palm kernel oil, and coconut oil.  Avoid foods with partially hydrogenated oils in them. These contain trans fats. Examples of foods that have trans fats are stick margarine, some tub margarines, cookies, crackers, and other baked goods. What general guidelines do I need to follow?  Check food labels. Look for the words "trans fat" and "saturated fat."  When preparing a meal:  Fill half of your plate with vegetables and green salads.  Fill one fourth of your plate with whole grains. Look for the word "whole" as the first word in the ingredient list.  Fill one fourth of your plate with lean protein foods.  Eat more foods that have fiber, like apples, carrots, beans, peas, and barley.  Eat more home-cooked foods. Eat less at restaurants and buffets.  Limit or avoid alcohol.  Limit foods high in starch and sugar.  Limit fried foods.  Cook foods without frying them. Baking, boiling, grilling, and broiling are all great options.  Lose weight if you are overweight. Losing even a small amount of weight can help your overall health. It  can also help prevent diseases such as diabetes and heart disease. What foods can I eat? Grains  Whole grains, such as whole wheat or whole grain breads, crackers, cereals, and pasta. Unsweetened oatmeal, bulgur, barley, quinoa, or brown rice. Corn or whole wheat flour tortillas. Vegetables  Fresh or frozen vegetables (raw, steamed, roasted, or grilled). Green salads. Fruits  All fresh, canned (in natural juice), or frozen fruits. Meat and Other Protein Products  Ground beef (85% or leaner), grass-fed beef, or beef trimmed of fat. Skinless chicken or [place]. Ground chicken or [place]. Pork trimmed of fat. All fish and seafood. Eggs. Dried beans, peas, or lentils. Unsalted nuts or seeds. Unsalted canned or dry beans. Dairy  Low-fat dairy products, such as skim or 1% milk, 2% or reduced-fat cheeses, low-fat ricotta or cottage cheese, or plain low-fat yogurt. Fats and Oils  Tub margarines without trans fats. Light or reduced-fat mayonnaise and salad dressings. Avocado. Olive, canola, sesame, or safflower oils. Natural peanut or almond butter (choose ones without added sugar and oil). The items listed above may not be a complete list of recommended foods or beverages. Contact your dietitian for more options.  What foods are not recommended? Grains  White bread. White pasta. White rice. Cornbread. Bagels, pastries, and croissants. Crackers that contain trans fat. Vegetables  White potatoes. Corn. Creamed or fried vegetables. Vegetables in a cheese sauce. Fruits  Dried fruits. Canned fruit in light or heavy syrup. Fruit juice. Meat  and Other Protein Products  Fatty cuts of meat. Ribs, chicken wings, bacon, sausage, bologna, salami, chitterlings, fatback, hot dogs, bratwurst, and packaged luncheon meats. Liver and organ meats. Dairy  Whole or 2% milk, cream, half-and-half, and cream cheese. Whole milk cheeses. Whole-fat or sweetened yogurt. Full-fat cheeses. Nondairy creamers and whipped toppings.  Processed cheese, cheese spreads, or cheese curds. Sweets and Desserts  Corn syrup, sugars, honey, and molasses. Candy. Jam and jelly. Syrup. Sweetened cereals. Cookies, pies, cakes, donuts, muffins, and ice cream. Fats and Oils  Butter, stick margarine, lard, shortening, ghee, or bacon fat. Coconut, palm kernel, or palm oils. Beverages  Alcohol. Sweetened drinks (such as sodas, lemonade, and fruit drinks or punches). The items listed above may not be a complete list of foods and beverages to avoid. Contact your dietitian for more information.  This information is not intended to replace advice given to you by your health care provider. Make sure you discuss any questions you have with your health care provider. Document Released: 11/14/2011 Document Revised: 01/20/2016 Document Reviewed: 08/14/2013 Elsevier Interactive Patient Education  2017 Elsevier Inc.   Low-Fiber Diet Fiber is found in fruits, vegetables, and whole grains. A low-fiber diet restricts fibrous foods that are not digested in the small intestine. A diet containing about 10-15 grams of fiber per day is considered low fiber. Low-fiber diets may be used to:  Promote healing and rest the bowel during intestinal flare-ups.  Prevent blockage of a partially obstructed or narrowed gastrointestinal tract.  Reduce fecal weight and volume.  Slow the movement of feces. You may be on a low-fiber diet as a transitional diet following surgery, after an injury (trauma), or because of a short (acute) or lifelong (chronic) illness. Your health care provider will determine the length of time you need to stay on this diet. What do I need to know about a low-fiber diet? Always check the fiber content on the packaging's Nutrition Facts label, especially on foods from the grains list. Ask your dietitian if you have questions about specific foods that are related to your condition, especially if the food is not listed below. In general, a  low-fiber food will have less than 2 g of fiber. What foods can I eat? Grains  All breads and crackers made with white flour. Sweet rolls, doughnuts, waffles, pancakes, [place] toast, bagels. Pretzels, Melba toast, zwieback. Well-cooked cereals, such as cornmeal, farina, or cream cereals. Dry cereals that do not contain whole grains, fruit, or nuts, such as refined corn, wheat, rice, and oat cereals. Potatoes prepared any way without skins, plain pastas and noodles, refined white rice. Use white flour for baking and making sauces. Use allowed list of grains for casseroles, dumplings, and puddings. Vegetables  Strained tomato and vegetable juices. Fresh lettuce, cucumber, spinach. Well-cooked (no skin or pulp) or canned vegetables, such as asparagus, bean sprouts, beets, carrots, green beans, mushrooms, potatoes, pumpkin, spinach, yellow squash, tomato sauce/puree, turnips, yams, and zucchini. Keep servings limited to  cup. Fruits  All fruit juices except prune juice. Cooked or canned fruits without skin and seeds, such as applesauce, apricots, cherries, fruit cocktail, grapefruit, grapes, mandarin oranges, melons, peaches, pears, pineapple, and plums. Fresh fruits without skin, such as apricots, avocados, bananas, melons, pineapple, nectarines, and peaches. Keep servings limited to  cup or 1 piece. Meat and Other Protein Sources  Ground or well-cooked tender beef, ham, veal, lamb, pork, or poultry. Eggs, plain cheese. Fish, oysters, shrimp, lobster, and other seafood. Liver, organ meats. Smooth nut  butters. Dairy  All milk products and alternative dairy substitutes, such as soy, rice, almond, and coconut, not containing added whole nuts, seeds, or added fruit. Beverages  Decaf coffee, fruit, and vegetable juices or smoothies (small amounts, with no pulp or skins, and with fruits from allowed list), sports drinks, herbal tea. Condiments  Ketchup, mustard, vinegar, cream sauce, cheese sauce, cocoa  powder. Spices in moderation, such as allspice, basil, bay leaves, celery powder or leaves, cinnamon, cumin powder, curry powder, ginger, mace, marjoram, onion or garlic powder, oregano, paprika, parsley flakes, ground pepper, rosemary, sage, savory, tarragon, thyme, and turmeric. Sweets and Desserts  Plain cakes and cookies, pie made with allowed fruit, pudding, custard, cream pie. Gelatin, fruit, ice, sherbet, frozen ice pops. Ice cream, ice milk without nuts. Plain hard candy, honey, jelly, molasses, syrup, sugar, chocolate syrup, gumdrops, marshmallows. Limit overall sugar intake. Fats and Oil  Margarine, butter, cream, mayonnaise, salad oils, plain salad dressings made from allowed foods. Choose healthy fats such as olive oil, canola oil, and omega-3 fatty acids (such as found in salmon or tuna) when possible. Other  Bouillon, broth, or cream soups made from allowed foods. Any strained soup. Casseroles or mixed dishes made with allowed foods. The items listed above may not be a complete list of recommended foods or beverages. Contact your dietitian for more options.  What foods are not recommended? Grains  All whole wheat and whole grain breads and crackers. Multigrains, rye, bran seeds, nuts, or coconut. Cereals containing whole grains, multigrains, bran, coconut, nuts, raisins. Cooked or dry oatmeal, steel-cut oats. Coarse wheat cereals, granola. Cereals advertised as high fiber. Potato skins. Whole grain pasta, wild or brown rice. Popcorn. Coconut flour. Bran, buckwheat, corn bread, multigrains, rye, wheat germ. Vegetables  Fresh, cooked or canned vegetables, such as artichokes, asparagus, beet greens, broccoli, Brussels sprouts, cabbage, celery, cauliflower, corn, eggplant, kale, legumes or beans, okra, peas, and tomatoes. Avoid large servings of any vegetables, especially raw vegetables. Fruits  Fresh fruits, such as apples with or without skin, berries, cherries, figs, grapes, grapefruit,  guavas, kiwis, mangoes, oranges, papayas, pears, persimmons, pineapple, and pomegranate. Prune juice and juices with pulp, stewed or dried prunes. Dried fruits, dates, raisins. Fruit seeds or skins. Avoid large servings of all fresh fruits. Meats and Other Protein Sources  Tough, fibrous meats with gristle. Chunky nut butter. Cheese made with seeds, nuts, or other foods not recommended. Nuts, seeds, legumes (beans, including baked beans), dried peas, beans, lentils. Dairy  Yogurt or cheese that contains nuts, seeds, or added fruit. Beverages  Fruit juices with high pulp, prune juice. Caffeinated coffee and teas. Condiments  Coconut, maple syrup, pickles, olives. Sweets and Desserts  Desserts, cookies, or candies that contain nuts or coconut, chunky peanut butter, dried fruits. Jams, preserves with seeds, marmalade. Large amounts of sugar and sweets. Any other dessert made with fruits from the not recommended list. Other  Soups made from vegetables that are not recommended or that contain other foods not recommended. The items listed above may not be a complete list of foods and beverages to avoid. Contact your dietitian for more information.  This information is not intended to replace advice given to you by your health care provider. Make sure you discuss any questions you have with your health care provider. Document Released: 11/04/2001 Document Revised: 10/21/2015 Document Reviewed: 04/07/2013 Elsevier Interactive Patient Education  2017 [place] have been scheduled for a gastric emptying scan at [facility] on  11/10/2016 at  7:30am. Please arrive at least 15 minutes prior to your appointment for registration. Please make certain not to have anything to eat or drink after midnight the night before your test. Hold all stomach medications (ex: Zofran, phenergan, Reglan) 48 hours prior to your test. If you need to reschedule your appointment, please contact radiology  scheduling at [phone]. _____________________________________________________________________ A gastric-emptying study measures how long it takes for food to move through your stomach. There are several ways to measure stomach emptying. In the most common test, you eat food that contains a small amount of radioactive material. A scanner that detects the movement of the radioactive material is placed over your abdomen to monitor the rate at which food leaves your stomach. This test normally takes about 4 hours to complete. _____________________________________________________________________   [facility] [facility] have been scheduled for a colonoscopy. Please follow written instructions given to you at your visit today.  Please pick up your prep supplies at the pharmacy within the next 1-3 days. If you use inhalers (even only as needed), please bring them with you on the day of your procedure. Your physician has requested that you go to www.startemmi.com and enter the access code given to you at your visit today. This web site gives a general overview about your procedure. However, you should still follow specific instructions given to you by our office regarding your preparation for the procedure.

## 2016-10-18 NOTE — Progress Notes (Addendum)
Margaret [name]    [id number]    1950/12/18  Primary Care Physician:Blyth, Bonnita Levan, MD  Referring Physician: Mosie Lukes, MD [address] [place], [place] [address]  Chief complaint: Bloating, abdominal pain,   HPI: 66 year old female here for follow-up visit with complaints of excessive bloating, fullness associated with epigastric abdominal pain and diffuse abdominal cramps. She feels full after a meal and is worse when she eats raw vegetables or fruits with excessive fiber or greasy food. She also experiences heartburn with nausea when she is bloated. She eats kimchi  and fermented vegetables. She is taking PPI as needed. Denies any change in bowel habits or blood in stool.   Esophageal manometry does not show any significant abnormality and pH impedance showed no evidence of increased acid or nonacid reflux events  Outpatient Encounter Prescriptions as of 10/18/2016  Medication Sig  . aspirin EC 325 MG EC tablet Take 1 tablet (325 mg total) by mouth daily.  . fish oil-omega-3 fatty acids 1000 MG capsule Take 2 g by mouth daily.  . Multiple Vitamin (MULTIVITAMIN WITH MINERALS) TABS tablet Take 1 tablet by mouth daily.  Marland Kitchen omeprazole (PRILOSEC) 20 MG capsule Take 1 capsule (20 mg total) by mouth daily as needed.  . rosuvastatin (CRESTOR) 10 MG tablet Take 1 tablet (10 mg total) by mouth daily.   No facility-administered encounter medications on file as of 10/18/2016.     Allergies as of 10/18/2016  . (No Known Allergies)    Past Medical History:  Diagnosis Date  . Allergic state 03/25/2012  . Aortic calcification ([place]) 08/27/2014  . At risk for colon cancer    Last colonoscopy in [place] 03/2009 found hyperplastic polyp only  . Atherosclerosis of aorta ([place]) 04/12/2014  . GERD (gastroesophageal reflux disease)   . Hiatal hernia with gastroesophageal reflux 04/27/2010   Qualifier: Diagnosis of  By: Danelle Earthly CMA, Darlene  Failed Omeprazole and Protonix    . Hyperlipidemia   . Osteopenia 08/31/2012  . Peripheral edema 12/13/2014  . Sleep apnea   . Unspecified sinusitis (chronic) 03/30/2013    Past Surgical History:  Procedure Laterality Date  . [place] STUDY N/A 12/15/2015   Procedure: [place] STUDY;  Surgeon: Mauri Pole, MD;  Location: [facility];  Service: Endoscopy;  Laterality: N/A;  . DILATION AND CURETTAGE OF UTERUS    . ESOPHAGEAL MANOMETRY N/A 12/15/2015   Procedure: ESOPHAGEAL MANOMETRY (EM);  Surgeon: Mauri Pole, MD;  Location: [facility];  Service: Endoscopy;  Laterality: N/A;    Family History  Problem Relation Age of Onset  . Stroke Mother   . Lung cancer Father   . Stroke Sister        12  . Thyroid disease Sister   . Diabetes Sister   . Hypertension Sister   . Hyperlipidemia Sister   . Hypertension Sister   . Sleep apnea Son   . Obesity Son     Social History   Social History  . Marital status: Married    Spouse name: [name]  . Number of children: 2  . Years of education: 16   Occupational History  . Accountant     retired   Social History Main Topics  . Smoking status: Never Smoker  . Smokeless tobacco: Never Used  . Alcohol use 0.0 oz/week     Comment: Rarely  . Drug use: No  . Sexual activity:  Not on file     Comment: no dietary restrictions, lives with husand, works at [employer]   Other Topics Concern  . Not on file   Social History Narrative   Lives with husband   Caffeine- tea, 2 cups daily      Review of systems: Review of Systems  Constitutional: Negative for fever and chills.  HENT: Negative.   Eyes: Negative for blurred vision.  Respiratory: Negative for cough, shortness of breath and wheezing.   Cardiovascular: Negative for chest pain and palpitations.  Gastrointestinal: as per HPI Genitourinary: Negative for dysuria, urgency, frequency and hematuria.  Musculoskeletal: Positive for myalgias, back pain and joint pain.  Skin: Negative for itching and rash.    Neurological: Negative for dizziness, tremors, focal weakness, seizures and loss of consciousness.  Endo/Heme/Allergies: Positive for seasonal allergies.  Psychiatric/Behavioral: Negative for depression, suicidal ideas and hallucinations.  All other systems reviewed and are negative.   Physical Exam: Vitals:   10/18/16 1317  BP: 108/64  Pulse: 66   Body mass index is 32.12 kg/m. Gen:      No acute distress HEENT:  EOMI, sclera anicteric Neck:     No masses; no thyromegaly Lungs:    Clear to auscultation bilaterally; normal respiratory effort CV:         Regular rate and rhythm; no murmurs Abd:      + bowel sounds; soft, non-tender; no palpable masses, no distension Ext:    No edema; adequate peripheral perfusion Skin:      Warm and dry; no rash Neuro: alert and oriented x 3 Psych: normal mood and affect  Data Reviewed:  Reviewed labs, radiology imaging, old records and pertinent past GI work up  Colonoscopy in [place] Nov 2010 found a hyperplastic polyp, recall colonoscopy at 5 year interval due to history of colon polyps on prior colonoscopies, report not available. EGD and EUS by Dr. Ardis Hughs 06/2010 Mild gastritis, biopsies showed no H. Pylori. 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  Esophageal manometry 11/2015: No significant abnormality 24-hour pH impedance 11/2015: Slightly elevated weakly acid reflux events but otherwise normal DeMesster score and overall reflux events within normal limits. 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, functional heartburn here with complaints of abdominal discomfort and bloating Advised patient to decrease fiber intake and also fermented foods to decrease bloating We'll do a trial of probiotic VSL#3 one capsule daily and IB gard 1 capsule 3 times daily as needed  Upper abdominal pain with fullness postprandial We'll check gastric  emptying scan to evaluate for possible gastroparesis Small frequent meals Avoid high-fiber and high fat diet  Patient is planning to go on cruise and is concerned about possible nausea while she is on cruise We'll send prescription for Zofran 4 mg 3 times daily as needed  Colorectal cancer screening: Increased risk with history of polyps Last colonoscopy in November 2010 Schedule for colonoscopy The risks and benefits as well as alternatives of endoscopic procedure(s) have been discussed and reviewed. All questions answered. The patient agrees to proceed.   Return in 2-3 months or sooner if needed  25 minutes was spent face-to-face with the patient. Greater than 50% of the time used for counseling as well as treatment plan and follow-up. She had multiple questions which were answered to her satisfaction  K. Denzil Magnuson , MD [phone] Mon-Fri 8a-5p [phone] after 5p, weekends, holidays  CC: Mosie Lukes, MD

## 2016-10-19 ENCOUNTER — Telehealth: Payer: Self-pay | Admitting: *Deleted

## 2016-10-19 DIAGNOSIS — Z86010 Personal history of colonic polyps: Secondary | ICD-10-CM

## 2016-10-19 DIAGNOSIS — R110 Nausea: Secondary | ICD-10-CM

## 2016-10-19 DIAGNOSIS — R109 Unspecified abdominal pain: Secondary | ICD-10-CM

## 2016-10-19 MED ORDER — NA SULFATE-K SULFATE-MG SULF 17.5-3.13-1.6 GM/177ML PO SOLN: 1.0000 | Freq: Once | ORAL | 0 refills | Status: AC

## 2016-10-19 NOTE — Telephone Encounter (Signed)
Colonoscopy scheduled for 11/24/2016 Friday at 10am , Patient aware of date and time and I will mail her instructions in the mail for her colonoscopy, she was just seen in the office on 10/18/2016. She will sign her paperwork the day of her procedure

## 2016-10-30 ENCOUNTER — Ambulatory Visit (HOSPITAL_COMMUNITY): Payer: BLUE CROSS/BLUE SHIELD

## 2016-11-03 ENCOUNTER — Telehealth: Payer: Self-pay | Admitting: Gastroenterology

## 2016-11-03 NOTE — Telephone Encounter (Signed)
Called patient to answer all questions about gastric emptying scan schedued for 6/15, patient has been feeling bad and may cancel her gastric emptying scan, told her if she does she should reschedule it call back as needed

## 2016-11-10 ENCOUNTER — Ambulatory Visit (HOSPITAL_COMMUNITY): Payer: BLUE CROSS/BLUE SHIELD

## 2016-11-10 ENCOUNTER — Encounter: Payer: Self-pay | Admitting: Gastroenterology

## 2016-11-11 ENCOUNTER — Other Ambulatory Visit: Payer: Self-pay | Admitting: Gastroenterology

## 2016-11-17 ENCOUNTER — Ambulatory Visit: Payer: BLUE CROSS/BLUE SHIELD | Admitting: Family Medicine

## 2016-11-22 ENCOUNTER — Ambulatory Visit (HOSPITAL_COMMUNITY)
Admission: RE | Admit: 2016-11-22 | Discharge: 2016-11-22 | Disposition: A | Discharge status: Home / Self Care | Payer: BLUE CROSS/BLUE SHIELD | Source: Ambulatory Visit | Attending: Gastroenterology | Admitting: Gastroenterology

## 2016-11-22 DIAGNOSIS — R110 Nausea: Secondary | ICD-10-CM | POA: Insufficient documentation

## 2016-11-22 DIAGNOSIS — R109 Unspecified abdominal pain: Secondary | ICD-10-CM | POA: Insufficient documentation

## 2016-11-22 DIAGNOSIS — Z86010 Personal history of colonic polyps: Secondary | ICD-10-CM | POA: Diagnosis not present

## 2016-11-22 DIAGNOSIS — R140 Abdominal distension (gaseous): Secondary | ICD-10-CM

## 2016-11-22 DIAGNOSIS — R12 Heartburn: Secondary | ICD-10-CM | POA: Diagnosis not present

## 2016-11-22 MED ORDER — TECHNETIUM TC 99M SULFUR COLLOID: 2.1000 | Freq: Once | INTRAVENOUS | Status: DC | PRN

## 2016-11-24 ENCOUNTER — Encounter: Payer: BLUE CROSS/BLUE SHIELD | Admitting: Gastroenterology

## 2017-01-04 ENCOUNTER — Ambulatory Visit (INDEPENDENT_AMBULATORY_CARE_PROVIDER_SITE_OTHER): Payer: BLUE CROSS/BLUE SHIELD | Admitting: Family Medicine

## 2017-01-04 ENCOUNTER — Encounter: Payer: Self-pay | Admitting: Family Medicine

## 2017-01-04 DIAGNOSIS — J329 Chronic sinusitis, unspecified: Secondary | ICD-10-CM

## 2017-01-04 DIAGNOSIS — I10 Essential (primary) hypertension: Secondary | ICD-10-CM | POA: Diagnosis not present

## 2017-01-04 DIAGNOSIS — R739 Hyperglycemia, unspecified: Secondary | ICD-10-CM

## 2017-01-04 MED ORDER — AMOXICILLIN 500 MG PO CAPS: 500.0000 mg | ORAL_CAPSULE | Freq: Three times a day (TID) | ORAL | 0 refills | Status: DC

## 2017-01-04 NOTE — Assessment & Plan Note (Signed)
Encouraged increased rest and hydration, add probiotics, zinc such as Coldeze or Xicam. Treat fevers as needed. Plain Mucinex twice daily x 10 days. Vitamin C 500 to 1000 mg daily, elderberry and aged or black garlic. Amoxicillin for 10-14 days.

## 2017-01-04 NOTE — Assessment & Plan Note (Signed)
minimize simple carbs. Increase exercise as tolerated.  

## 2017-01-04 NOTE — Assessment & Plan Note (Signed)
Slightly elevated with acute illness. no changes to meds. Encouraged heart healthy diet such as the DASH diet and exercise as tolerated.

## 2017-01-04 NOTE — Patient Instructions (Addendum)
Cherritussin is over the counter Encouraged increased rest and hydration, add probiotics, zinc such as Coldeze or Xicam. Treat fevers as needed. Plain Mucinex twice daily x 10 days. Vitamin C 500 to 1000 mg daily, elderberry and aged or black garlic. Amoxicillin for 10-14 days.  Sinusitis, Adult Sinusitis is soreness and inflammation of your sinuses. Sinuses are hollow spaces in the bones around your face. They are located:  Around your eyes.  In the middle of your forehead.  Behind your nose.  In your cheekbones.  Your sinuses and nasal passages are lined with a stringy fluid (mucus). Mucus normally drains out of your sinuses. When your nasal tissues get inflamed or swollen, the mucus can get trapped or blocked so air cannot flow through your sinuses. This lets bacteria, viruses, and funguses grow, and that leads to infection. Follow these instructions at home: Medicines  Take, use, or apply over-the-counter and prescription medicines only as told by your doctor. These may include nasal sprays.  If you were prescribed an antibiotic medicine, take it as told by your doctor. Do not stop taking the antibiotic even if you start to feel better. Hydrate and Humidify  Drink enough water to keep your pee (urine) clear or pale yellow.  Use a cool mist humidifier to keep the humidity level in your home above 50%.  Breathe in steam for 10-15 minutes, 3-4 times a day or as told by your doctor. You can do this in the bathroom while a hot shower is running.  Try not to spend time in cool or dry air. Rest  Rest as much as possible.  Sleep with your head raised (elevated).  Make sure to get enough sleep each night. General instructions  Put a warm, moist washcloth on your face 3-4 times a day or as told by your doctor. This will help with discomfort.  Wash your hands often with soap and water. If there is no soap and water, use hand sanitizer.  Do not smoke. Avoid being around people who  are smoking (secondhand smoke).  Keep all follow-up visits as told by your doctor. This is important. Contact a doctor if:  You have a fever.  Your symptoms get worse.  Your symptoms do not get better within 10 days. Get help right away if:  You have a very bad headache.  You cannot stop throwing up (vomiting).  You have pain or swelling around your face or eyes.  You have trouble seeing.  You feel confused.  Your neck is stiff.  You have trouble breathing. This information is not intended to replace advice given to you by your health care provider. Make sure you discuss any questions you have with your health care provider. Document Released: 11/01/2007 Document Revised: 01/09/2016 Document Reviewed: 03/10/2015 Elsevier Interactive Patient Education  2018 [place] increased rest and hydration, add probiotics, zinc such as Coldeze or Xicam. Treat fevers as needed. Plain Mucinex twice daily x 10 days. Vitamin C 500 to 1000 mg daily, elderberry and aged or black garlic. Amoxicillin for 10-14 days.

## 2017-01-04 NOTE — Progress Notes (Signed)
Subjective:  I acted as a Education administrator for Dr. Charlett Blake. [place], [place]  Patient ID: Margaret [name], female    DOB: Oct 31, 1950, 66 y.o.   MRN: [id number]  No chief complaint on file.   HPI  Patient is in today for an acute visit. Patient c/o having headaches, cough, bodyaches and congestions for the past 3 weeks. She states she has not gotten better despite OTC meds. Notes sore throat, ears potting, sinus pressure, malaise and myalgias. Denies CP/palp/SOB/fevers/GI or GU c/o. Taking meds as prescribed Patient Care Team: Mosie Lukes, MD as PCP - General (Family Medicine) Rigoberto Noel, MD as Consulting Physician (Pulmonary Disease) Lelon Perla, MD as Consulting Physician (Cardiology) Myra Rude (Inactive) as Consulting Physician (Gastroenterology)   Past Medical History:  Diagnosis Date  . Allergic state 03/25/2012  . Aortic calcification ([place]) 08/27/2014  . At risk for colon cancer    Last colonoscopy in [place] 03/2009 found hyperplastic polyp only  . Atherosclerosis of aorta ([place]) 04/12/2014  . GERD (gastroesophageal reflux disease)   . Hiatal hernia with gastroesophageal reflux 04/27/2010   Qualifier: Diagnosis of  By: Danelle Earthly CMA, Darlene  Failed Omeprazole and Protonix   . Hyperlipidemia   . Osteopenia 08/31/2012  . Peripheral edema 12/13/2014  . Sleep apnea   . Unspecified sinusitis (chronic) 03/30/2013    Past Surgical History:  Procedure Laterality Date  . [place] STUDY N/A 12/15/2015   Procedure: [place] STUDY;  Surgeon: Mauri Pole, MD;  Location: [facility];  Service: Endoscopy;  Laterality: N/A;  . DILATION AND CURETTAGE OF UTERUS    . ESOPHAGEAL MANOMETRY N/A 12/15/2015   Procedure: ESOPHAGEAL MANOMETRY (EM);  Surgeon: Mauri Pole, MD;  Location: [facility];  Service: Endoscopy;  Laterality: N/A;    Family History  Problem Relation Age of Onset  . Stroke Mother   . Lung cancer Father   . Stroke Sister        19  . Thyroid  disease Sister   . Diabetes Sister   . Hypertension Sister   . Hyperlipidemia Sister   . Hypertension Sister   . Sleep apnea Son   . Obesity Son     Social History   Social History  . Marital status: Married    Spouse name: [name]  . Number of children: 2  . Years of education: 16   Occupational History  . Accountant     retired   Social History Main Topics  . Smoking status: Never Smoker  . Smokeless tobacco: Never Used  . Alcohol use 0.0 oz/week     Comment: Rarely  . Drug use: No  . Sexual activity: Not on file     Comment: no dietary restrictions, lives with husand, works at [employer]   Other Topics Concern  . Not on file   Social History Narrative   Lives with husband   Caffeine- tea, 2 cups daily    Outpatient Medications Prior to Visit  Medication Sig Dispense Refill  . aspirin EC 325 MG EC tablet Take 1 tablet (325 mg total) by mouth daily. 30 tablet 0  . esomeprazole (NEXIUM) 40 MG capsule TAKE 1 CAPSULE (40 MG TOTAL) BY MOUTH DAILY AT 12 NOON. 30 capsule 11  . fish oil-omega-3 fatty acids 1000 MG capsule Take 2 g by mouth daily.    . Multiple Vitamin (MULTIVITAMIN WITH MINERALS) TABS tablet Take 1 tablet by mouth daily.    Marland Kitchen omeprazole (PRILOSEC) 20  MG capsule Take 1 capsule (20 mg total) by mouth daily as needed. 60 capsule 0  . ondansetron (ZOFRAN) 4 MG tablet Take 1 tablet (4 mg total) by mouth every 8 (eight) hours as needed for nausea or vomiting. 30 tablet 0  . rosuvastatin (CRESTOR) 10 MG tablet Take 1 tablet (10 mg total) by mouth daily. 30 tablet 6   No facility-administered medications prior to visit.     No Known Allergies  Review of Systems  Constitutional: Positive for malaise/fatigue. Negative for fever.  HENT: Positive for congestion, ear pain, sinus pain and sore throat. Negative for ear discharge and nosebleeds.   Eyes: Negative for blurred vision.  Respiratory: Positive for cough and sputum production. Negative for shortness of breath and  wheezing.   Cardiovascular: Negative for chest pain, palpitations and leg swelling.  Gastrointestinal: Negative for vomiting.  Musculoskeletal: Positive for myalgias. Negative for back pain.  Skin: Negative for rash.  Neurological: Positive for headaches. Negative for loss of consciousness.       Objective:    Physical Exam  Constitutional: She is oriented to person, place, and time. She appears well-developed and well-nourished. No distress.  HENT:  Head: Normocephalic and atraumatic.  Eyes: Conjunctivae are normal.  Neck: Normal range of motion. No thyromegaly present.  Cardiovascular: Normal rate and regular rhythm.   Pulmonary/Chest: Effort normal and breath sounds normal. She has no wheezes.  Abdominal: Soft. Bowel sounds are normal. There is no tenderness.  Musculoskeletal: Normal range of motion. She exhibits no edema or deformity.  Neurological: She is alert and oriented to person, place, and time.  Skin: Skin is warm and dry. She is not diaphoretic.  Psychiatric: She has a normal mood and affect.    BP (!) 148/68 (BP Location: Left Arm, Patient Position: Sitting, Cuff Size: Normal)   Pulse 92   Temp 98.4 F (36.9 C) (Oral)   Resp 18   Wt 172 lb 12.8 oz (78.4 kg)   SpO2 96%   BMI 31.61 kg/m  Wt Readings from Last 3 Encounters:  01/04/17 172 lb 12.8 oz (78.4 kg)  10/18/16 175 lb 9.6 oz (79.7 kg)  10/10/16 176 lb 6.4 oz (80 kg)   BP Readings from Last 3 Encounters:  01/04/17 (!) 148/68  10/18/16 108/64  10/10/16 135/64     Immunization History  Administered Date(s) Administered  . Influenza,inj,Quad PF,36+ Mos 03/24/2013, 04/07/2014, 02/11/2015, 06/01/2016  . Pneumococcal Conjugate-13 04/10/2013    Health Maintenance  Topic Date Due  . PAP SMEAR  02/27/2016  . INFLUENZA VACCINE  12/27/2016  . TETANUS/TDAP  03/14/2017 (Originally 05/13/1972)  . MAMMOGRAM  09/06/2017  . COLONOSCOPY  06/12/2018  . Hepatitis C Screening  Completed  . HIV Screening   Completed    Lab Results  Component Value Date   WBC 6.1 09/06/2016   HGB 13.1 09/06/2016   HCT 39.6 09/06/2016   PLT 205 09/06/2016   GLUCOSE 108 (H) 09/06/2016   CHOL 150 09/06/2016   TRIG 106 09/06/2016   HDL 49 09/06/2016   LDLCALC 80 09/06/2016   ALT 31 09/06/2016   AST 23 09/06/2016   NA 142 09/06/2016   K 3.8 09/06/2016   CL 104 09/06/2016   CREATININE 0.64 09/06/2016   BUN 8 09/06/2016   CO2 29 09/06/2016   TSH 2.06 07/21/2016   HGBA1C 6.0 (H) 09/06/2016   MICROALBUR 3.06 (H) 05/16/2011    Lab Results  Component Value Date   TSH 2.06 07/21/2016   Lab  Results  Component Value Date   WBC 6.1 09/06/2016   HGB 13.1 09/06/2016   HCT 39.6 09/06/2016   MCV 86.1 09/06/2016   PLT 205 09/06/2016   Lab Results  Component Value Date   NA 142 09/06/2016   K 3.8 09/06/2016   CO2 29 09/06/2016   GLUCOSE 108 (H) 09/06/2016   BUN 8 09/06/2016   CREATININE 0.64 09/06/2016   BILITOT 0.6 09/06/2016   ALKPHOS 72 09/06/2016   AST 23 09/06/2016   ALT 31 09/06/2016   PROT 6.5 09/06/2016   ALBUMIN 3.8 09/06/2016   CALCIUM 9.5 09/06/2016   ANIONGAP 9 09/06/2016   GFR 92.95 02/21/2016   Lab Results  Component Value Date   CHOL 150 09/06/2016   Lab Results  Component Value Date   HDL 49 09/06/2016   Lab Results  Component Value Date   LDLCALC 80 09/06/2016   Lab Results  Component Value Date   TRIG 106 09/06/2016   Lab Results  Component Value Date   CHOLHDL 3.1 09/06/2016   Lab Results  Component Value Date   HGBA1C 6.0 (H) 09/06/2016         Assessment & Plan:   Problem List Items Addressed This Visit    Sinusitis    Encouraged increased rest and hydration, add probiotics, zinc such as Coldeze or Xicam. Treat fevers as needed. Plain Mucinex twice daily x 10 days. Vitamin C 500 to 1000 mg daily, elderberry and aged or black garlic. Amoxicillin for 10-14 days.      Relevant Medications   amoxicillin (AMOXIL) 500 MG capsule   Hyperglycemia     minimize simple carbs. Increase exercise as tolerated.       Essential hypertension    Slightly elevated with acute illness. no changes to meds. Encouraged heart healthy diet such as the DASH diet and exercise as tolerated.          I am having Margaret [name] start on amoxicillin. I am also having her maintain her fish oil-omega-3 fatty acids, omeprazole, multivitamin with minerals, aspirin, rosuvastatin, ondansetron, and esomeprazole.  Meds ordered this encounter  Medications  . amoxicillin (AMOXIL) 500 MG capsule    Sig: Take 1 capsule (500 mg total) by mouth 3 (three) times daily.    Dispense:  42 capsule    Refill:  0    CMA served as scribe during this visit. History, Physical and Plan performed by medical provider. Documentation and orders reviewed and attested to.  Penni Homans, MD

## 2017-01-16 ENCOUNTER — Ambulatory Visit (AMBULATORY_SURGERY_CENTER): Payer: BLUE CROSS/BLUE SHIELD | Admitting: Gastroenterology

## 2017-01-16 ENCOUNTER — Encounter: Payer: Self-pay | Admitting: Gastroenterology

## 2017-01-16 VITALS — BP 147/70 | HR 60 | Temp 98.2°F | Resp 12 | Ht 62.0 in | Wt 175.0 lb

## 2017-01-16 DIAGNOSIS — D122 Benign neoplasm of ascending colon: Secondary | ICD-10-CM | POA: Diagnosis not present

## 2017-01-16 DIAGNOSIS — R109 Unspecified abdominal pain: Secondary | ICD-10-CM

## 2017-01-16 MED ORDER — SODIUM CHLORIDE 0.9 % IV SOLN: 500.0000 mL | INTRAVENOUS | Status: DC

## 2017-01-16 NOTE — Op Note (Signed)
[facility] Patient Name: Margaret [name] Procedure Date: 01/16/2017 10:43 AM MRN: [id number] Endoscopist: Mauri Pole , MD Age: 66 Referring MD:  Date of Birth: 1950/09/18 Gender: Female Account #: [id number] Procedure:                Colonoscopy Indications:              High risk colon cancer surveillance: Personal                            history of colonic polyps, Last colonoscopy: 2010 Medicines:                Monitored Anesthesia Care Procedure:                Pre-Anesthesia Assessment:                           - Prior to the procedure, a History and Physical                            was performed, and patient medications and                            allergies were reviewed. The patient's tolerance of                            previous anesthesia was also reviewed. The risks                            and benefits of the procedure and the sedation                            options and risks were discussed with the patient.                            All questions were answered, and informed consent                            was obtained. Prior Anticoagulants: The patient has                            taken no previous anticoagulant or antiplatelet                            agents. ASA Grade Assessment: II - A patient with                            mild systemic disease. After reviewing the risks                            and benefits, the patient was deemed in                            satisfactory condition to undergo the procedure.  After obtaining informed consent, the colonoscope                            was passed under direct vision. Throughout the                            procedure, the patient's blood pressure, pulse, and                            oxygen saturations were monitored continuously. The                            Colonoscope was introduced through the anus and   advanced to the the cecum, identified by                            appendiceal orifice and ileocecal valve. The                            colonoscopy was performed without difficulty. The                            patient tolerated the procedure well. The quality                            of the bowel preparation was excellent. The                            ileocecal valve, appendiceal orifice, and rectum                            were photographed. Scope In: 10:50:05 AM Scope Out: 11:16:49 AM Scope Withdrawal Time: 0 hours 16 minutes 53 seconds  Total Procedure Duration: 0 hours 26 minutes 44 seconds  Findings:                 The perianal and digital rectal examinations were                            normal.                           A 5 mm polyp was found in the sigmoid colon. The                            polyp was sessile. The polyp was removed with a                            cold snare. Resection and retrieval were complete.                           A 10 mm polyp was found in the rectum. The polyp                            was sessile. The  polyp was removed with a cold                            snare. Resection and retrieval were complete.                            Coagulation for hemostasis using monopolar probe                            was unsuccessful. To prevent bleeding after the                            polypectomy, one hemostatic clip was successfully                            placed (MR conditional). There was no bleeding at                            the end of the procedure.                           Multiple small and large-mouthed diverticula were                            found in the sigmoid colon and ascending colon.                           Non-bleeding internal hemorrhoids were found during                            retroflexion. The hemorrhoids were small. Complications:            No immediate complications. Estimated Blood Loss:      Estimated blood loss was minimal. Impression:               - One 5 mm polyp in the sigmoid colon, removed with                            a cold snare. Resected and retrieved.                           - One 10 mm polyp in the rectum, removed with a                            cold snare. Resected and retrieved. Treatment not                            successful. Treated with a monopolar probe. Clip                            (MR conditional) was placed.                           - Diverticulosis in the sigmoid colon and in the  ascending colon.                           - Non-bleeding internal hemorrhoids. Recommendation:           - Patient has a contact number available for                            emergencies. The signs and symptoms of potential                            delayed complications were discussed with the                            patient. Return to normal activities tomorrow.                            Written discharge instructions were provided to the                            patient.                           - Resume previous diet.                           - Continue present medications.                           - Await pathology results.                           - Repeat colonoscopy in 3 - 5 years for                            surveillance based on pathology results. Mauri Pole, MD 01/16/2017 11:24:32 AM This report has been signed electronically.

## 2017-01-16 NOTE — Patient Instructions (Signed)
Impression/Recommendations:  Polyp handout given to patient. Diverticulosis handout given to patient. Hemorrhoid handout given to patient.  Resume previous diet. Continue present medications.  Repeat colonoscopy in 3-5 years for surveillance based on pathology results.  YOU HAD AN ENDOSCOPIC PROCEDURE TODAY AT THE [facility]:   Refer to the procedure report that was given to you for any specific questions about what was found during the examination.  If the procedure report does not answer your questions, please call your gastroenterologist to clarify.  If you requested that your care partner not be given the details of your procedure findings, then the procedure report has been included in a sealed envelope for you to review at your convenience later.  YOU SHOULD EXPECT: Some feelings of bloating in the abdomen. Passage of more gas than usual.  Walking can help get rid of the air that was put into your GI tract during the procedure and reduce the bloating. If you had a lower endoscopy (such as a colonoscopy or flexible sigmoidoscopy) you may notice spotting of blood in your stool or on the toilet paper. If you underwent a bowel prep for your procedure, you may not have a normal bowel movement for a few days.  Please Note:  You might notice some irritation and congestion in your nose or some drainage.  This is from the oxygen used during your procedure.  There is no need for concern and it should clear up in a day or so.  SYMPTOMS TO REPORT IMMEDIATELY:   Following lower endoscopy (colonoscopy or flexible sigmoidoscopy):  Excessive amounts of blood in the stool  Significant tenderness or worsening of abdominal pains  Swelling of the abdomen that is new, acute  Fever of 100F or higher  For urgent or emergent issues, a gastroenterologist can be reached at any hour by calling [phone].   DIET:  We do recommend a small meal at first, but then you may proceed to your  regular diet.  Drink plenty of fluids but you should avoid alcoholic beverages for 24 hours.  ACTIVITY:  You should plan to take it easy for the rest of today and you should NOT DRIVE or use heavy machinery until tomorrow (because of the sedation medicines used during the test).    FOLLOW UP: Our staff will call the number listed on your records the next business day following your procedure to check on you and address any questions or concerns that you may have regarding the information given to you following your procedure. If we do not reach you, we will leave a message.  However, if you are feeling well and you are not experiencing any problems, there is no need to return our call.  We will assume that you have returned to your regular daily activities without incident.  If any biopsies were taken you will be contacted by phone or by letter within the next 1-3 weeks.  Please call us at [phone] if you have not heard about the biopsies in 3 weeks.    SIGNATURES/CONFIDENTIALITY: You and/or your care partner have signed paperwork which will be entered into your electronic medical record.  These signatures attest to the fact that that the information above on your After Visit Summary has been reviewed and is understood.  Full responsibility of the confidentiality of this discharge information lies with you and/or your care-partner. 

## 2017-01-16 NOTE — Progress Notes (Signed)
Spontaneous respirations throughout. VSS. Resting comfortably. To PACU on room air. Report to  RN. 

## 2017-01-16 NOTE — Progress Notes (Signed)
Called to room to assist during endoscopic procedure.  Patient ID and intended procedure confirmed with present staff. Received instructions for my participation in the procedure from the performing physician.  

## 2017-01-17 ENCOUNTER — Telehealth: Payer: Self-pay | Admitting: *Deleted

## 2017-01-17 NOTE — Telephone Encounter (Signed)
  Follow up Call-  Call back number 01/16/2017  Post procedure Call Back phone  # [phone]  Some recent data might be hidden     Patient questions:  Do you have a fever, pain , or abdominal swelling? No. Pain Score  0 *  Have you tolerated food without any problems? Yes.    Have you been able to return to your normal activities? Yes.    Do you have any questions about your discharge instructions: Diet   No. Medications  No. Follow up visit  No.  Do you have questions or concerns about your Care? No.  Actions: * If pain score is 4 or above: No action needed, pain <4.

## 2017-01-20 ENCOUNTER — Encounter: Payer: Self-pay | Admitting: Gastroenterology

## 2017-01-22 ENCOUNTER — Telehealth: Payer: Self-pay | Admitting: Gastroenterology

## 2017-01-22 NOTE — Telephone Encounter (Signed)
Advised of her results per the letter written by Dr Silverio Decamp. Copy of biopsy report mailed to the patient.

## 2017-01-23 ENCOUNTER — Ambulatory Visit (INDEPENDENT_AMBULATORY_CARE_PROVIDER_SITE_OTHER): Payer: BLUE CROSS/BLUE SHIELD | Admitting: Family Medicine

## 2017-01-23 ENCOUNTER — Encounter: Payer: Self-pay | Admitting: Family Medicine

## 2017-01-23 VITALS — BP 126/70 | HR 70 | Temp 98.2°F | Resp 18 | Wt 173.6 lb

## 2017-01-23 DIAGNOSIS — Z860101 Personal history of adenomatous and serrated colon polyps: Secondary | ICD-10-CM

## 2017-01-23 DIAGNOSIS — R739 Hyperglycemia, unspecified: Secondary | ICD-10-CM

## 2017-01-23 DIAGNOSIS — M8580 Other specified disorders of bone density and structure, unspecified site: Secondary | ICD-10-CM | POA: Diagnosis not present

## 2017-01-23 DIAGNOSIS — I10 Essential (primary) hypertension: Secondary | ICD-10-CM

## 2017-01-23 DIAGNOSIS — E782 Mixed hyperlipidemia: Secondary | ICD-10-CM

## 2017-01-23 DIAGNOSIS — J329 Chronic sinusitis, unspecified: Secondary | ICD-10-CM

## 2017-01-23 DIAGNOSIS — Z124 Encounter for screening for malignant neoplasm of cervix: Secondary | ICD-10-CM

## 2017-01-23 DIAGNOSIS — Z86010 Personal history of colonic polyps: Secondary | ICD-10-CM | POA: Diagnosis not present

## 2017-01-23 DIAGNOSIS — K5900 Constipation, unspecified: Secondary | ICD-10-CM | POA: Diagnosis not present

## 2017-01-23 HISTORY — DX: Personal history of colonic polyps: Z86.010

## 2017-01-23 HISTORY — DX: Constipation, unspecified: K59.00

## 2017-01-23 HISTORY — DX: Personal history of adenomatous and serrated colon polyps: Z86.0101

## 2017-01-23 MED ORDER — ASPIRIN EC 81 MG PO TBEC: 81.0000 mg | DELAYED_RELEASE_TABLET | Freq: Every day | ORAL | Status: DC

## 2017-01-23 NOTE — Assessment & Plan Note (Signed)
Well controlled, no changes to meds. Encouraged heart healthy diet such as the DASH diet and exercise as tolerated.  °

## 2017-01-23 NOTE — Assessment & Plan Note (Signed)
Encouraged increased hydration and fiber in diet. Daily probiotics. If bowels not moving can use MOM 2 tbls po in 4 oz of warm prune juice by mouth every 2-3 days. If no results then repeat in 4 hours with  Dulcolax suppository pr, may repeat again in 4 more hours as needed. Seek care if symptoms worsen. Consider daily Miralax and/or Dulcolax if symptoms persist.  

## 2017-01-23 NOTE — Assessment & Plan Note (Signed)
Encouraged heart healthy diet, increase exercise, avoid trans fats, consider a krill oil cap daily 

## 2017-01-23 NOTE — Patient Instructions (Signed)

## 2017-01-23 NOTE — Assessment & Plan Note (Signed)
Colonoscopy 01/16/2017 with 2 polyps one was adenomatous. recommeded follow up in 5 years.performed by Dr Silverio Decamp. Encouraged increased hydration and fiber in diet. Daily probiotics. If bowels not moving can use MOM 2 tbls po in 4 oz of warm prune juice by mouth every 2-3 days. If no results then repeat in 4 hours with  Dulcolax suppository pr, may repeat again in 4 more hours as needed. Seek care if symptoms worsen. Consider daily Miralax and/or Dulcolax if symptoms persist.

## 2017-01-23 NOTE — Assessment & Plan Note (Signed)
minimize simple carbs. Increase exercise as tolerated.  

## 2017-01-23 NOTE — Assessment & Plan Note (Signed)
Doing better. No further cough.

## 2017-01-23 NOTE — Progress Notes (Signed)
Subjective:  I acted as a Education administrator for Dr. Charlett Blake. [place], [place]  Patient ID: Margaret [name], female    DOB: May 19, 1951, 66 y.o.   MRN: [id number]  No chief complaint on file.   HPI  Patient is in today for a follow up. Patient has no acute concerns. No recent febrile illness or acute hospitalizations. Denies CP/palp/SOB/HA/congestion/fevers/GI or GU c/o. Taking meds as prescribed. She is feeling much better. No sinus pressure, cough, sneezing, fevers or new acute concerns. Notes some post nasal drip is ongoing.   Patient Care Team: Mosie Lukes, MD as PCP - General (Family Medicine) Rigoberto Noel, MD as Consulting Physician (Pulmonary Disease) Lelon Perla, MD as Consulting Physician (Cardiology) Myra Rude (Inactive) as Consulting Physician (Gastroenterology)   Past Medical History:  Diagnosis Date  . Allergic state 03/25/2012  . Allergy   . Aortic calcification ([facility]) 08/27/2014  . Atherosclerosis of aorta ([facility]) 04/12/2014  . Constipation 01/23/2017  . GERD (gastroesophageal reflux disease)   . Hiatal hernia with gastroesophageal reflux 04/27/2010   Qualifier: Diagnosis of  By: Danelle Earthly CMA, Darlene  Failed Omeprazole and Protonix   . Hx of adenomatous polyp of colon 01/23/2017   Colonoscopy 01/16/2017 with 2 polyps one was adenomatous. recommeded follow up in 5 years.performed by Dr Silverio Decamp  . Hyperlipidemia   . Osteopenia 08/31/2012  . Peripheral edema 12/13/2014  . Sleep apnea   . Unspecified sinusitis (chronic) 03/30/2013    Past Surgical History:  Procedure Laterality Date  . [place] STUDY N/A 12/15/2015   Procedure: [place] STUDY;  Surgeon: Mauri Pole, MD;  Location: [facility];  Service: Endoscopy;  Laterality: N/A;  . DILATION AND CURETTAGE OF UTERUS    . ESOPHAGEAL MANOMETRY N/A 12/15/2015   Procedure: ESOPHAGEAL MANOMETRY (EM);  Surgeon: Mauri Pole, MD;  Location: [facility];  Service: Endoscopy;  Laterality: N/A;     Family History  Problem Relation Age of Onset  . Stroke Mother   . Lung cancer Father   . Stroke Sister        48  . Thyroid disease Sister   . Diabetes Sister   . Hypertension Sister   . Hyperlipidemia Sister   . Hypertension Sister   . Sleep apnea Son   . Obesity Son     Social History   Social History  . Marital status: Married    Spouse name: [name]  . Number of children: 2  . Years of education: 16   Occupational History  . Accountant     retired   Social History Main Topics  . Smoking status: Never Smoker  . Smokeless tobacco: Never Used  . Alcohol use 0.0 oz/week     Comment: Rarely  . Drug use: No  . Sexual activity: Not on file     Comment: no dietary restrictions, lives with husand, works at [employer]   Other Topics Concern  . Not on file   Social History Narrative   Lives with husband   Caffeine- tea, 2 cups daily    Outpatient Medications Prior to Visit  Medication Sig Dispense Refill  . fish oil-omega-3 fatty acids 1000 MG capsule Take 2 g by mouth daily.    . Multiple Vitamin (MULTIVITAMIN WITH MINERALS) TABS tablet Take 1 tablet by mouth daily.    Marland Kitchen omeprazole (PRILOSEC) 20 MG capsule Take 1 capsule (20 mg total) by mouth daily as needed. 60 capsule 0  . rosuvastatin (CRESTOR) 10 MG  tablet Take 1 tablet (10 mg total) by mouth daily. 30 tablet 6  . aspirin EC 325 MG EC tablet Take 1 tablet (325 mg total) by mouth daily. 30 tablet 0  . esomeprazole (NEXIUM) 40 MG capsule TAKE 1 CAPSULE (40 MG TOTAL) BY MOUTH DAILY AT 12 NOON. (Patient not taking: Reported on 01/16/2017) 30 capsule 11  . ondansetron (ZOFRAN) 4 MG tablet Take 1 tablet (4 mg total) by mouth every 8 (eight) hours as needed for nausea or vomiting. 30 tablet 0  . 0.9 %  sodium chloride infusion      No facility-administered medications prior to visit.     No Known Allergies  Review of Systems  Constitutional: Negative for fever and malaise/fatigue.  HENT: Negative for congestion.    Eyes: Negative for blurred vision.  Respiratory: Negative for cough and shortness of breath.   Cardiovascular: Negative for chest pain, palpitations and leg swelling.  Gastrointestinal: Negative for vomiting.  Musculoskeletal: Negative for back pain.  Skin: Negative for rash.  Neurological: Negative for loss of consciousness and headaches.       Objective:    Physical Exam  Constitutional: She is oriented to person, place, and time. She appears well-developed and well-nourished. No distress.  HENT:  Head: Normocephalic and atraumatic.  Eyes: Conjunctivae are normal.  Neck: Normal range of motion. No thyromegaly present.  Cardiovascular: Normal rate and regular rhythm.   Pulmonary/Chest: Effort normal and breath sounds normal. She has no wheezes.  Abdominal: Soft. Bowel sounds are normal. There is no tenderness.  Musculoskeletal: Normal range of motion. She exhibits no edema or deformity.  Neurological: She is alert and oriented to person, place, and time.  Skin: Skin is warm and dry. She is not diaphoretic.  Psychiatric: She has a normal mood and affect.    BP 126/70 (BP Location: Left Arm, Patient Position: Sitting, Cuff Size: Normal)   Pulse 70   Temp 98.2 F (36.8 C) (Oral)   Resp 18   Wt 173 lb 9.6 oz (78.7 kg)   SpO2 97%   BMI 31.75 kg/m  Wt Readings from Last 3 Encounters:  01/23/17 173 lb 9.6 oz (78.7 kg)  01/16/17 175 lb (79.4 kg)  01/04/17 172 lb 12.8 oz (78.4 kg)   BP Readings from Last 3 Encounters:  01/23/17 126/70  01/16/17 (!) 147/70  01/04/17 (!) 148/68     Immunization History  Administered Date(s) Administered  . Influenza,inj,Quad PF,6+ Mos 03/24/2013, 04/07/2014, 02/11/2015, 06/01/2016  . Pneumococcal Conjugate-13 04/10/2013    Health Maintenance  Topic Date Due  . PAP SMEAR  02/27/2016  . INFLUENZA VACCINE  12/27/2016  . TETANUS/TDAP  03/14/2017 (Originally 05/13/1972)  . MAMMOGRAM  09/06/2017  . COLONOSCOPY  01/16/2022  . Hepatitis C  Screening  Completed  . HIV Screening  Completed    Lab Results  Component Value Date   WBC 6.1 09/06/2016   HGB 13.1 09/06/2016   HCT 39.6 09/06/2016   PLT 205 09/06/2016   GLUCOSE 108 (H) 09/06/2016   CHOL 150 09/06/2016   TRIG 106 09/06/2016   HDL 49 09/06/2016   LDLCALC 80 09/06/2016   ALT 31 09/06/2016   AST 23 09/06/2016   NA 142 09/06/2016   K 3.8 09/06/2016   CL 104 09/06/2016   CREATININE 0.64 09/06/2016   BUN 8 09/06/2016   CO2 29 09/06/2016   TSH 2.06 07/21/2016   HGBA1C 6.0 (H) 09/06/2016   MICROALBUR 3.06 (H) 05/16/2011    Lab Results  Component Value Date   TSH 2.06 07/21/2016   Lab Results  Component Value Date   WBC 6.1 09/06/2016   HGB 13.1 09/06/2016   HCT 39.6 09/06/2016   MCV 86.1 09/06/2016   PLT 205 09/06/2016   Lab Results  Component Value Date   NA 142 09/06/2016   K 3.8 09/06/2016   CO2 29 09/06/2016   GLUCOSE 108 (H) 09/06/2016   BUN 8 09/06/2016   CREATININE 0.64 09/06/2016   BILITOT 0.6 09/06/2016   ALKPHOS 72 09/06/2016   AST 23 09/06/2016   ALT 31 09/06/2016   PROT 6.5 09/06/2016   ALBUMIN 3.8 09/06/2016   CALCIUM 9.5 09/06/2016   ANIONGAP 9 09/06/2016   GFR 92.95 02/21/2016   Lab Results  Component Value Date   CHOL 150 09/06/2016   Lab Results  Component Value Date   HDL 49 09/06/2016   Lab Results  Component Value Date   LDLCALC 80 09/06/2016   Lab Results  Component Value Date   TRIG 106 09/06/2016   Lab Results  Component Value Date   CHOLHDL 3.1 09/06/2016   Lab Results  Component Value Date   HGBA1C 6.0 (H) 09/06/2016         Assessment & Plan:   Problem List Items Addressed This Visit    Hyperlipidemia, mixed    Encouraged heart healthy diet, increase exercise, avoid trans fats, consider a krill oil cap daily      Relevant Medications   aspirin EC 81 MG tablet   Other Relevant Orders   Lipid panel   Osteopenia    Encouraged to get adequate exercise, calcium and vitamin d intake        Sinusitis    Doing better. No further cough.      Hyperglycemia     minimize simple carbs. Increase exercise as tolerated.      Relevant Orders   Hemoglobin A1c   Essential hypertension    Well controlled, no changes to meds. Encouraged heart healthy diet such as the DASH diet and exercise as tolerated.       Relevant Medications   aspirin EC 81 MG tablet   Other Relevant Orders   CBC   Comprehensive metabolic panel   TSH   Hx of adenomatous polyp of colon    Colonoscopy 01/16/2017 with 2 polyps one was adenomatous. recommeded follow up in 5 years.performed by Dr Silverio Decamp. Encouraged increased hydration and fiber in diet. Daily probiotics. If bowels not moving can use MOM 2 tbls po in 4 oz of warm prune juice by mouth every 2-3 days. If no results then repeat in 4 hours with  Dulcolax suppository pr, may repeat again in 4 more hours as needed. Seek care if symptoms worsen. Consider daily Miralax and/or Dulcolax if symptoms persist.       Constipation    Encouraged increased hydration and fiber in diet. Daily probiotics. If bowels not moving can use MOM 2 tbls po in 4 oz of warm prune juice by mouth every 2-3 days. If no results then repeat in 4 hours with  Dulcolax suppository pr, may repeat again in 4 more hours as needed. Seek care if symptoms worsen. Consider daily Miralax and/or Dulcolax if symptoms persist.        Other Visit Diagnoses    Cervical cancer screening    -  Primary   Relevant Orders   Ambulatory referral to Obstetrics / Gynecology      I have discontinued Ms. Jansma  aspirin, ondansetron, and esomeprazole. I am also having her start on aspirin EC. Additionally, I am having her maintain her fish oil-omega-3 fatty acids, omeprazole, multivitamin with minerals, and rosuvastatin. We will stop administering sodium chloride.  Meds ordered this encounter  Medications  . aspirin EC 81 MG tablet    Sig: Take 1 tablet (81 mg total) by mouth daily.    CMA served  as Education administrator during this visit. History, Physical and Plan performed by medical provider. Documentation and orders reviewed and attested to.  Penni Homans, MD

## 2017-01-23 NOTE — Assessment & Plan Note (Signed)
Encouraged to get adequate exercise, calcium and vitamin d intake 

## 2017-01-24 LAB — COMPREHENSIVE METABOLIC PANEL
ALBUMIN: 4.2 g/dL (ref 3.5–5.2)
ALK PHOS: 72 U/L (ref 39–117)
ALT: 25 U/L (ref 0–35)
AST: 21 U/L (ref 0–37)
BUN: 16 mg/dL (ref 6–23)
CO2: 31 mEq/L (ref 19–32)
Calcium: 9.8 mg/dL (ref 8.4–10.5)
Chloride: 102 mEq/L (ref 96–112)
Creatinine, Ser: 0.64 mg/dL (ref 0.40–1.20)
GFR: 99.39 mL/min (ref >60.00)
Glucose, Bld: 89 mg/dL (ref 70–99)
POTASSIUM: 4.3 meq/L (ref 3.5–5.1)
Sodium: 140 mEq/L (ref 135–145)
TOTAL PROTEIN: 7.3 g/dL (ref 6.0–8.3)
Total Bilirubin: 0.2 mg/dL (ref 0.2–1.2)

## 2017-01-24 LAB — CBC
HEMATOCRIT: 40.7 % (ref 36.0–46.0)
HEMOGLOBIN: 13.4 g/dL (ref 12.0–15.0)
MCHC: 32.9 g/dL (ref 30.0–36.0)
MCV: 88.1 fl (ref 78.0–100.0)
Platelets: 230 10*3/uL (ref 150.0–400.0)
RBC: 4.62 Mil/uL (ref 3.87–5.11)
RDW: 14.3 % (ref 11.5–15.5)
WBC: 6.1 10*3/uL (ref 4.0–10.5)

## 2017-01-24 LAB — LIPID PANEL
Cholesterol: 184 mg/dL (ref 0–200)
HDL: 53.4 mg/dL (ref >39.00)
LDL Cholesterol: 92 mg/dL (ref 0–99)
NonHDL: 130.44
TRIGLYCERIDES: 193 mg/dL — AB (ref 0.0–149.0)
Total CHOL/HDL Ratio: 3
VLDL: 38.6 mg/dL (ref 0.0–40.0)

## 2017-01-24 LAB — HEMOGLOBIN A1C: Hgb A1c MFr Bld: 6.1 % (ref 4.6–6.5)

## 2017-01-24 LAB — TSH: TSH: 1.28 u[IU]/mL (ref 0.35–4.50)

## 2017-01-26 ENCOUNTER — Telehealth: Payer: Self-pay | Admitting: Gastroenterology

## 2017-01-26 ENCOUNTER — Other Ambulatory Visit: Payer: Self-pay

## 2017-01-26 DIAGNOSIS — K219 Gastro-esophageal reflux disease without esophagitis: Secondary | ICD-10-CM

## 2017-01-26 DIAGNOSIS — K449 Diaphragmatic hernia without obstruction or gangrene: Principal | ICD-10-CM

## 2017-01-26 MED ORDER — SIMETHICONE 180 MG PO CAPS: 1.0000 | ORAL_CAPSULE | Freq: Three times a day (TID) | ORAL | 0 refills | Status: DC

## 2017-01-26 MED ORDER — ESOMEPRAZOLE MAGNESIUM 40 MG PO CPDR: 40.0000 mg | DELAYED_RELEASE_CAPSULE | Freq: Every day | ORAL | 11 refills | Status: DC

## 2017-01-26 MED ORDER — LIDOCAINE (ANORECTAL) 5 % EX CREA: 1.0000 | TOPICAL_CREAM | Freq: Two times a day (BID) | CUTANEOUS | 0 refills | Status: DC

## 2017-01-26 MED ORDER — OMEPRAZOLE 20 MG PO CPDR: 20.0000 mg | DELAYED_RELEASE_CAPSULE | Freq: Every day | ORAL | 0 refills | Status: DC | PRN

## 2017-01-26 NOTE — Telephone Encounter (Signed)
Continue current meds. Phazyme 1 capsule TID as needed. Recticare twice daily as needed, apply small amount per rectum. Bring her in if I have any cancellations. Thanks

## 2017-01-26 NOTE — Telephone Encounter (Signed)
Patient is advised. Put on the wait list. No appointments available.

## 2017-01-26 NOTE — Telephone Encounter (Signed)
Continues to have "gerd" starting with the moment she awakens "before I do anything at all." Confirmed medications. She says she takes Nexium 40 every morning and she takes Omeprazole 20 mg every evening at bedtime. Burping is "terrible" because it makes "everything worse" and a "pinch " in her upper abdominal area. She wants to see you. She says she is not worse, but she is not better.  Also her rectum hurts and she is uncomfortable when she sits.

## 2017-02-19 ENCOUNTER — Telehealth: Payer: Self-pay | Admitting: Gastroenterology

## 2017-02-19 NOTE — Telephone Encounter (Signed)
Patient will come in tomorrow for her abdominal pain. This is an ongoing issue. She is on a PPI. Burping.

## 2017-02-20 ENCOUNTER — Ambulatory Visit (INDEPENDENT_AMBULATORY_CARE_PROVIDER_SITE_OTHER): Payer: BLUE CROSS/BLUE SHIELD | Admitting: Gastroenterology

## 2017-02-20 ENCOUNTER — Encounter: Payer: Self-pay | Admitting: Gastroenterology

## 2017-02-20 VITALS — BP 122/64 | HR 68 | Ht 62.0 in | Wt 175.4 lb

## 2017-02-20 DIAGNOSIS — K588 Other irritable bowel syndrome: Secondary | ICD-10-CM | POA: Diagnosis not present

## 2017-02-20 DIAGNOSIS — R140 Abdominal distension (gaseous): Secondary | ICD-10-CM | POA: Diagnosis not present

## 2017-02-20 DIAGNOSIS — K3184 Gastroparesis: Secondary | ICD-10-CM

## 2017-02-20 DIAGNOSIS — K219 Gastro-esophageal reflux disease without esophagitis: Secondary | ICD-10-CM

## 2017-02-20 MED ORDER — RANITIDINE HCL 150 MG PO TABS: 150.0000 mg | ORAL_TABLET | Freq: Every day | ORAL | 3 refills | Status: DC

## 2017-02-20 MED ORDER — ESOMEPRAZOLE MAGNESIUM 40 MG PO CPDR: 40.0000 mg | DELAYED_RELEASE_CAPSULE | Freq: Two times a day (BID) | ORAL | 6 refills | Status: DC

## 2017-02-20 MED ORDER — METOCLOPRAMIDE HCL 5 MG PO TABS: 5.0000 mg | ORAL_TABLET | Freq: Three times a day (TID) | ORAL | 0 refills | Status: DC | PRN

## 2017-02-20 NOTE — Patient Instructions (Signed)
We have sent your increase of Nexium to your pharmacy  We have sent Zantac to your pharmacy  We have sent reglan to your pharmacy   Gastroparesis Gastroparesis, also called delayed gastric emptying, is a condition in which food takes longer than normal to empty from the stomach. The condition is usually long-lasting (chronic). What are the causes? This condition may be caused by:  An endocrine disorder, such as hypothyroidism or diabetes. Diabetes is the most common cause of this condition.  A nervous system disease, such as Parkinson disease or multiple sclerosis.  Cancer, infection, or surgery of the stomach or vagus nerve.  A connective tissue disorder, such as scleroderma.  Certain medicines.  In most cases, the cause is not known. What increases the risk? This condition is more likely to develop in:  People with certain disorders, including endocrine disorders, eating disorders, amyloidosis, and scleroderma.  People with certain diseases, including Parkinson disease or multiple sclerosis.  People with cancer or infection of the stomach or vagus nerve.  People who have had surgery on the stomach or vagus nerve.  People who take certain medicines.  Women.  What are the signs or symptoms? Symptoms of this condition include:  An early feeling of fullness when eating.  Nausea.  Weight loss.  Vomiting.  Heartburn.  Abdominal bloating.  Inconsistent blood glucose levels.  Lack of appetite.  Acid from the stomach coming up into the esophagus (gastroesophageal reflux).  Spasms of the stomach.  Symptoms may come and go. How is this diagnosed? This condition is diagnosed with tests, such as:  Tests that check how long it takes food to move through the stomach and intestines. These tests include: ? Upper gastrointestinal (GI) series. In this test, X-rays of the intestines are taken after you drink a liquid. The liquid makes the intestines show up better on  the X-rays. ? Gastric emptying scintigraphy. In this test, scans are taken after you eat food that contains a small amount of radioactive material. ? Wireless capsule GI monitoring system. This test involves swallowing a capsule that records information about movement through the stomach.  Gastric manometry. This test measures electrical and muscular activity in the stomach. It is done with a thin tube that is passed down the throat and into the stomach.  Endoscopy. This test checks for abnormalities in the lining of the stomach. It is done with a long, thin tube that is passed down the throat and into the stomach.  An ultrasound. This test can help rule out gallbladder disease or pancreatitis as a cause of your symptoms. It uses sound waves to take pictures of the inside of your body.  How is this treated? There is no cure for gastroparesis. This condition may be managed with:  Treatment of the underlying condition causing the gastroparesis.  Lifestyle changes, including exercise and dietary changes. Dietary changes can include: ? Changes in what and when you eat. ? Eating smaller meals more often. ? Eating low-fat foods. ? Eating low-fiber forms of high-fiber foods, such as cooked vegetables instead of raw vegetables. ? Having liquid foods in place of solid foods. Liquid foods are easier to digest.  Medicines. These may be given to control nausea and vomiting and to stimulate stomach muscles.  Getting food through a feeding tube. This may be done in severe cases.  A gastric neurostimulator. This is a device that is inserted into the body with surgery. It helps improve stomach emptying and control nausea and vomiting.  Follow  these instructions at home:  Follow your health care provider's instructions about exercise and diet.  Take medicines only as directed by your health care provider. Contact a health care provider if:  Your symptoms do not improve with treatment.  You have  new symptoms. Get help right away if:  You have severe abdominal pain that does not improve with treatment.  You have nausea that does not go away.  You cannot keep fluids down. This information is not intended to replace advice given to you by your health care provider. Make sure you discuss any questions you have with your health care provider. Document Released: 05/15/2005 Document Revised: 10/21/2015 Document Reviewed: 05/11/2014 Elsevier Interactive Patient Education  [employer].

## 2017-02-20 NOTE — Progress Notes (Signed)
Margaret [name]    [id number]    22-Aug-1950  Primary Care Physician:Blyth, Bonnita Levan, MD  Referring Physician: Mosie Lukes, MD [address] [place], [place] [address]  Chief complaint:  GERD  HPI:  66 year old female with irritable bowel syndrome, GERD, gastroparesis here for follow-up visit with complaints of excessive bloating and fullness and epigastric abdominal pain worse postprandial. She is trying to avoid greasy food and excessive fiber. Currently on Nexium once daily, but on most days she has increased heartburn and reflux symptoms in the evening and has to take an additional dose of Prilosec or Zantac. Denies any vomiting, diarrhea, melena or blood per rectum    Outpatient Encounter Prescriptions as of 02/20/2017  Medication Sig  . aspirin EC 81 MG tablet Take 1 tablet (81 mg total) by mouth daily.  Marland Kitchen esomeprazole (NEXIUM) 40 MG capsule Take 1 capsule (40 mg total) by mouth daily before breakfast. (Patient taking differently: Take 40 mg by mouth daily. )  . fish oil-omega-3 fatty acids 1000 MG capsule Take 2 g by mouth daily.  . Multiple Vitamin (MULTIVITAMIN WITH MINERALS) TABS tablet Take 1 tablet by mouth daily.  . rosuvastatin (CRESTOR) 10 MG tablet Take 1 tablet (10 mg total) by mouth daily.  . [DISCONTINUED] Lidocaine, Anorectal, (RECTICARE) 5 % CREA Apply 1 Applicatorful topically 2 (two) times daily.  . [DISCONTINUED] Simethicone (PHAZYME) 180 MG CAPS Take 1 capsule (180 mg total) by mouth 3 (three) times daily.  Marland Kitchen omeprazole (PRILOSEC) 20 MG capsule Take 1 capsule (20 mg total) by mouth daily as needed. (Patient not taking: Reported on 02/20/2017)   No facility-administered encounter medications on file as of 02/20/2017.     Allergies as of 02/20/2017  . (No Known Allergies)    Past Medical History:  Diagnosis Date  . Allergic state 03/25/2012  . Allergy   . Aortic calcification ([place]) 08/27/2014  . Atherosclerosis of aorta ([place])  04/12/2014  . Constipation 01/23/2017  . GERD (gastroesophageal reflux disease)   . Hiatal hernia with gastroesophageal reflux 04/27/2010   Qualifier: Diagnosis of  By: Danelle Earthly CMA, Darlene  Failed Omeprazole and Protonix   . Hx of adenomatous polyp of colon 01/23/2017   Colonoscopy 01/16/2017 with 2 polyps one was adenomatous. recommeded follow up in 5 years.performed by Dr Silverio Decamp  . Hyperlipidemia   . Osteopenia 08/31/2012  . Peripheral edema 12/13/2014  . Sleep apnea   . Unspecified sinusitis (chronic) 03/30/2013    Past Surgical History:  Procedure Laterality Date  . [place] STUDY N/A 12/15/2015   Procedure: [place] STUDY;  Surgeon: Mauri Pole, MD;  Location: [facility];  Service: Endoscopy;  Laterality: N/A;  . DILATION AND CURETTAGE OF UTERUS    . ESOPHAGEAL MANOMETRY N/A 12/15/2015   Procedure: ESOPHAGEAL MANOMETRY (EM);  Surgeon: Mauri Pole, MD;  Location: [facility];  Service: Endoscopy;  Laterality: N/A;    Family History  Problem Relation Age of Onset  . Stroke Mother   . Lung cancer Father   . Stroke Sister        41  . Thyroid disease Sister   . Diabetes Sister   . Hypertension Sister   . Hyperlipidemia Sister   . Hypertension Sister   . Sleep apnea Son   . Obesity Son     Social History   Social History  . Marital status: Married    Spouse name: [name]  .  Number of children: 2  . Years of education: 16   Occupational History  . Accountant     retired   Social History Main Topics  . Smoking status: Never Smoker  . Smokeless tobacco: Never Used  . Alcohol use 0.0 oz/week     Comment: Rarely  . Drug use: No  . Sexual activity: Not on file     Comment: no dietary restrictions, lives with husand, works at [employer]   Other Topics Concern  . Not on file   Social History Narrative   Lives with husband   Caffeine- tea, 2 cups daily      Review of systems: Review of Systems  Constitutional: Negative for fever and chills.  HENT:  Negative.   Eyes: Negative for blurred vision.  Respiratory: Negative for cough, shortness of breath and wheezing.   Cardiovascular: Negative for chest pain and palpitations.  Gastrointestinal: as per HPI Genitourinary: Negative for dysuria, urgency, frequency and hematuria.  Musculoskeletal: Positive for myalgias, back pain and joint pain.  Skin: Negative for itching and rash.  Neurological: Negative for dizziness, tremors, focal weakness, seizures and loss of consciousness.  Endo/Heme/Allergies: Positive for seasonal allergies.  Psychiatric/Behavioral: Negative for depression, suicidal ideas and hallucinations.  All other systems reviewed and are negative.   Physical Exam: Vitals:   02/20/17 1348  BP: 122/64  Pulse: 68   Body mass index is 32.08 kg/m. Gen:      No acute distress HEENT:  EOMI, sclera anicteric Neck:     No masses; no thyromegaly Lungs:    Clear to auscultation bilaterally; normal respiratory effort CV:         Regular rate and rhythm; no murmurs Abd:      + bowel sounds; soft, non-tender; no palpable masses, no distension Ext:    No edema; adequate peripheral perfusion Skin:      Warm and dry; no rash Neuro: alert and oriented x 3 Psych: normal mood and affect  Data Reviewed:  Reviewed labs, radiology imaging, old records and pertinent past GI work up  Esophageal manometry does not show any significant abnormality and pH impedance showed no evidence of increased acid or nonacid reflux events  Gastric emptying scan 45% emptied at 1 hr ( normal >= 10%)  63% emptied at 2 hr ( normal >= 40%)  85% emptied at 3 hr ( normal >= 70%)  86% emptied at 4 hr ( normal >= 90%)  IMPRESSION: Borderline decreased gastric emptying.  Assessment and Plan/Recommendations:  66 year old female with obesity, chronic GERD, borderline decreased gastric emptying concerning for gastroparesis, IBS with excessive bloating and postprandial fullness  GERD: Increase Nexium  to twice daily Zantac at bedtime as needed Discussed antireflux measures and lifestyle modification in detail  Gastroparesis: Discussed diet in detail Avoid excessive fiber and high fat diet All frequent meals We'll do a trial of low-dose Reglan 5 mg 3 times daily before meals    25 minutes was spent face-to-face with the patient. Greater than 50% of the time used for counseling as well as treatment plan and follow-up. She had multiple questions which were answered to her satisfaction  K. Denzil Magnuson , MD [phone] Mon-Fri 8a-5p [phone] after 5p, weekends, holidays  CC: Mosie Lukes, MD

## 2017-02-28 ENCOUNTER — Telehealth: Payer: Self-pay | Admitting: Gastroenterology

## 2017-02-28 NOTE — Telephone Encounter (Signed)
Insurance will not approve twice a day per patient, we have not received any denial on this patient for twice a day [facility]  Per pharmacists he stated her insurance will pay for twice a day nexium after 10-8   Does not require a prior authorization  Contacted patient to inform her and to take her current once a day NExium twice a day until the 8th

## 2017-03-22 ENCOUNTER — Telehealth: Payer: Self-pay | Admitting: *Deleted

## 2017-03-22 NOTE — Telephone Encounter (Signed)
Done a prior auth on Nexium twice a day for patient through cover my meds today

## 2017-03-25 ENCOUNTER — Other Ambulatory Visit: Payer: Self-pay | Admitting: Gastroenterology

## 2017-03-29 ENCOUNTER — Ambulatory Visit: Payer: BLUE CROSS/BLUE SHIELD

## 2017-03-29 ENCOUNTER — Ambulatory Visit (INDEPENDENT_AMBULATORY_CARE_PROVIDER_SITE_OTHER): Payer: BLUE CROSS/BLUE SHIELD

## 2017-03-29 DIAGNOSIS — Z23 Encounter for immunization: Secondary | ICD-10-CM

## 2017-04-24 ENCOUNTER — Ambulatory Visit: Payer: BLUE CROSS/BLUE SHIELD | Admitting: Gastroenterology

## 2017-04-26 ENCOUNTER — Encounter: Payer: Self-pay | Admitting: Gastroenterology

## 2017-04-26 ENCOUNTER — Ambulatory Visit (INDEPENDENT_AMBULATORY_CARE_PROVIDER_SITE_OTHER): Payer: BLUE CROSS/BLUE SHIELD | Admitting: Gastroenterology

## 2017-04-26 VITALS — BP 124/60 | HR 68 | Ht 60.63 in | Wt 176.2 lb

## 2017-04-26 DIAGNOSIS — R140 Abdominal distension (gaseous): Secondary | ICD-10-CM

## 2017-04-26 DIAGNOSIS — K648 Other hemorrhoids: Secondary | ICD-10-CM

## 2017-04-26 DIAGNOSIS — R1013 Epigastric pain: Secondary | ICD-10-CM | POA: Diagnosis not present

## 2017-04-26 DIAGNOSIS — R142 Eructation: Secondary | ICD-10-CM

## 2017-04-26 DIAGNOSIS — R143 Flatulence: Secondary | ICD-10-CM

## 2017-04-26 DIAGNOSIS — K3184 Gastroparesis: Secondary | ICD-10-CM

## 2017-04-26 DIAGNOSIS — K641 Second degree hemorrhoids: Secondary | ICD-10-CM

## 2017-04-26 DIAGNOSIS — K219 Gastro-esophageal reflux disease without esophagitis: Secondary | ICD-10-CM | POA: Diagnosis not present

## 2017-04-26 NOTE — Patient Instructions (Addendum)
HEMORRHOID BANDING PROCEDURE    FOLLOW-UP CARE   1. The procedure you have had should have been relatively painless since the banding of the area involved does not have nerve endings and there is no pain sensation.  The rubber band cuts off the blood supply to the hemorrhoid and the band may fall off as soon as 48 hours after the banding (the band may occasionally be seen in the toilet bowl following a bowel movement). You may notice a temporary feeling of fullness in the rectum which should respond adequately to plain Tylenol or Motrin.  2. Following the banding, avoid strenuous exercise that evening and resume full activity the next day.  A sitz bath (soaking in a warm tub) or bidet is soothing, and can be useful for cleansing the area after bowel movements.     3. To avoid constipation, take two tablespoons of natural wheat bran, natural oat bran, flax, Benefiber or any over the counter fiber supplement and increase your water intake to 7-8 glasses daily.    4. Unless you have been prescribed anorectal medication, do not put anything inside your rectum for two weeks: No suppositories, enemas, fingers, etc.  5. Occasionally, you may have more bleeding than usual after the banding procedure.  This is often from the untreated hemorrhoids rather than the treated one.  Don't be concerned if there is a tablespoon or so of blood.  If there is more blood than this, lie flat with your bottom higher than your head and apply an ice pack to the area. If the bleeding does not stop within a half an hour or if you feel faint, call our office at [phone] or go to the emergency room.  6. Problems are not common; however, if there is a substantial amount of bleeding, severe pain, chills, fever or difficulty passing urine (very rare) or other problems, you should call us at [phone] or report to the nearest emergency room.  7. Do not stay seated continuously for more than 2-3 hours for a day or two  after the procedure.  Tighten your buttock muscles 10-15 times every two hours and take 10-15 deep breaths every 1-2 hours.  Do not spend more than a few minutes on the toilet if you cannot empty your bowel; instead re-visit the toilet at a later time.   Take benefiber daily  1 tablespoon twice a day with meals  Use Gaviscon after meals as needed  Gas X three times a day as needed

## 2017-04-26 NOTE — Progress Notes (Signed)
Margaret [name]    [id number]    [birth year]  Primary Care Physician:Blyth, Bonnita Levan, MD  Referring Physician: Mosie Lukes, MD [address] [place], [place] [address]  Chief complaint: GERD, rectal pressure, hemorrhoids   HPI:  66 year old female with chronic GERD, mild gastroparesis, irritable bowel syndrome here for follow-up visit with complaints of excessive gas, bloating and epigastric fullness worse when she eats greasy food.  She had an episode with significant epigastric and chest pressure with gas and belching after she had [place] food.  She is currently on Nexium once a day, her insurance denied twice daily Nexium with breakthrough symptoms once or twice a week.  She has to take Tums at bedtime with some improvement.  She tried Reglan but did not notice any improvement and currently is not taking it. denies any dysphagia, nausea or vomiting.  Patient also complaining of pressure in the rectum and sensation of tissue protruding out but no bleeding.  No constipation or diarrhea.    Outpatient Encounter Medications as of 04/26/2017  Medication Sig  . aspirin EC 81 MG tablet Take 1 tablet (81 mg total) by mouth daily.  . Calcium Carbonate Antacid (TUMS PO) Take by mouth as needed.  Marland Kitchen esomeprazole (NEXIUM) 40 MG capsule Take 1 capsule (40 mg total) by mouth 2 (two) times daily before a meal.  . fish oil-omega-3 fatty acids 1000 MG capsule Take 2 g by mouth daily.  . Multiple Vitamin (MULTIVITAMIN WITH MINERALS) TABS tablet Take 1 tablet by mouth daily.  . ranitidine (ZANTAC) 150 MG tablet Take 1 tablet (150 mg total) by mouth at bedtime.  . rosuvastatin (CRESTOR) 10 MG tablet Take 1 tablet (10 mg total) by mouth daily.  . metoCLOPramide (REGLAN) 5 MG tablet TAKE 1 TABLET (5 MG TOTAL) BY MOUTH EVERY 8 (EIGHT) HOURS AS NEEDED FOR NAUSEA (BEFORE MEALS). (Patient not taking: Reported on 04/26/2017)   No facility-administered encounter medications on file  as of 04/26/2017.     Allergies as of 04/26/2017  . (No Known Allergies)    Past Medical History:  Diagnosis Date  . Allergic state 03/25/2012  . Allergy   . Aortic calcification ([place]) 08/27/2014  . Atherosclerosis of aorta ([place]) 04/12/2014  . Constipation 01/23/2017  . GERD (gastroesophageal reflux disease)   . Hiatal hernia with gastroesophageal reflux 04/27/2010   Qualifier: Diagnosis of  By: Danelle Earthly CMA, Darlene  Failed Omeprazole and Protonix   . Hx of adenomatous polyp of colon 01/23/2017   Colonoscopy 01/16/2017 with 2 polyps one was adenomatous. recommeded follow up in 5 years.performed by Dr Silverio Decamp  . Hyperlipidemia   . Osteopenia 08/31/2012  . Peripheral edema 12/13/2014  . Sleep apnea   . Unspecified sinusitis (chronic) 03/30/2013    Past Surgical History:  Procedure Laterality Date  . [place] STUDY N/A 12/15/2015   Procedure: [place] STUDY;  Surgeon: Mauri Pole, MD;  Location: [facility];  Service: Endoscopy;  Laterality: N/A;  . DILATION AND CURETTAGE OF UTERUS    . ESOPHAGEAL MANOMETRY N/A 12/15/2015   Procedure: ESOPHAGEAL MANOMETRY (EM);  Surgeon: Mauri Pole, MD;  Location: [facility];  Service: Endoscopy;  Laterality: N/A;    Family History  Problem Relation Age of Onset  . Stroke Mother   . Lung cancer Father   . Stroke Sister        15  . Thyroid disease Sister   .  Diabetes Sister   . Hypertension Sister   . Hyperlipidemia Sister   . Hypertension Sister   . Sleep apnea Son   . Obesity Son     Social History   Socioeconomic History  . Marital status: Married    Spouse name: [name]  . Number of children: 2  . Years of education: 50  . Highest education level: Not on file  Social Needs  . Financial resource strain: Not on file  . Food insecurity - worry: Not on file  . Food insecurity - inability: Not on file  . Transportation needs - medical: Not on file  . Transportation needs - non-medical: Not on file  Occupational  History  . Occupation: Optometrist    Comment: retired  Tobacco Use  . Smoking status: Never Smoker  . Smokeless tobacco: Never Used  Substance and Sexual Activity  . Alcohol use: Yes    Alcohol/week: 0.0 oz    Comment: Rarely  . Drug use: No  . Sexual activity: Not on file    Comment: no dietary restrictions, lives with husand, works at [employer]  Other Topics Concern  . Not on file  Social History Narrative   Lives with husband   Caffeine- tea, 2 cups daily      Review of systems: Review of Systems  Constitutional: Negative for fever and chills. Positive for lack of energy HENT: Negative.   Eyes: Negative for blurred vision.  Respiratory: Negative for cough, shortness of breath and wheezing.   Cardiovascular: Negative for chest pain and palpitations.  Gastrointestinal: as per HPI Genitourinary: Negative for dysuria, urgency, frequency and hematuria.  Musculoskeletal: Positive for myalgias, back pain and joint pain.  Skin: Negative for itching and rash.  Neurological: Negative for dizziness, tremors, focal weakness, seizures and loss of consciousness.  Endo/Heme/Allergies: Positive for seasonal allergies.  Psychiatric/Behavioral: Negative for depression, suicidal ideas and hallucinations.  All other systems reviewed and are negative.   Physical Exam: Vitals:   04/26/17 1312  BP: 124/60  Pulse: 68   Body mass index is 33.71 kg/m. Gen:      No acute distress HEENT:  EOMI, sclera anicteric Neck:     No masses; no thyromegaly Lungs:    Clear to auscultation bilaterally; normal respiratory effort CV:         Regular rate and rhythm; no murmurs Abd:      + bowel sounds; soft, non-tender; no palpable masses, no distension Ext:    No edema; adequate peripheral perfusion Skin:      Warm and dry; no rash Neuro: alert and oriented x 3 Psych: normal mood and affect  Data Reviewed:  Reviewed labs, radiology imaging, old records and pertinent past GI work up  Esophageal  manometry does not show any significant abnormality and pH impedance showed no evidence of increased acid or nonacid reflux events  Gastric emptying scan 45% emptied at 1 hr ( normal >= 10%)  63% emptied at 2 hr ( normal >= 40%)  85% emptied at 3 hr ( normal >= 70%)  86% emptied at 4 hr ( normal >= 90%)  IMPRESSION: Borderline decreased gastric emptying.  Assessment and Plan/Recommendations:  66 year old female with chronic GERD, obesity, mild gastroparesis with borderline decreased gastric emptying and symptomatic hemorrhoids  Continue Nexium 40 mg daily, according to patient her insurance will no longer cover Nexium in 2019.  She does not know what the preferred medication will be.  She will inform us once she hears back from her  insurance Ranitidine at bedtime as needed for nocturnal symptoms Gaviscon after meals for breakthrough heartburn Okay to use Gas-X or Phazyme 1 capsule 3 times daily as needed for excessive gas, belching and bloating  She has symptomatic grade II  hemorrhoids, requesting rubber band ligation of his/her hemorrhoidal disease.  All risks, benefits and alternative forms of therapy were described and informed consent was obtained.  In the Left Lateral Decubitus position anoscopic examination revealed grade II hemorrhoids in the right posterior, left lateral and right anterior position(s).  The anorectum was pre-medicated with 0.125% Nitroglycerine and Recticare The decision was made to band the Right posterior internal hemorrhoid, and the [place] was used to perform band ligation without complication.  Digital anorectal examination was then performed to assure proper positioning of the band, and to adjust the banded tissue as required.  The patient was discharged home without pain or other issues.  Dietary and behavioral recommendations were given and along with follow-up instructions.   Benefiber 1 tablespoon twice daily with meals  The patient  will return in 2-4 for  follow-up and possible additional banding as required. No complications were encountered and the patient tolerated the procedure well.    Damaris Hippo , MD [phone] Mon-Fri 8a-5p [phone] after 5p, weekends, holidays  CC: Mosie Lukes, MD

## 2017-05-01 ENCOUNTER — Telehealth: Payer: Self-pay | Admitting: Gastroenterology

## 2017-05-01 ENCOUNTER — Encounter: Payer: Self-pay | Admitting: Gastroenterology

## 2017-05-01 NOTE — Telephone Encounter (Signed)
Patient was calling to reschedule her banding appt from Monday to [facility]

## 2017-05-01 NOTE — Telephone Encounter (Signed)
Patient states she is returning Margaret [name]'s call.

## 2017-05-07 ENCOUNTER — Encounter: Payer: BLUE CROSS/BLUE SHIELD | Admitting: Gastroenterology

## 2017-05-23 ENCOUNTER — Encounter: Payer: BLUE CROSS/BLUE SHIELD | Admitting: Gastroenterology

## 2017-06-04 ENCOUNTER — Ambulatory Visit (INDEPENDENT_AMBULATORY_CARE_PROVIDER_SITE_OTHER): Payer: Managed Care, Other (non HMO) | Admitting: Gastroenterology

## 2017-06-04 ENCOUNTER — Encounter: Payer: Self-pay | Admitting: Gastroenterology

## 2017-06-04 VITALS — BP 148/72 | HR 70 | Ht 60.0 in | Wt 177.0 lb

## 2017-06-04 DIAGNOSIS — K641 Second degree hemorrhoids: Secondary | ICD-10-CM

## 2017-06-04 NOTE — Progress Notes (Signed)
PROCEDURE NOTE: The patient presents with symptomatic grade II hemorrhoids, requesting rubber band ligation of his/her hemorrhoidal disease.  All risks, benefits and alternative forms of therapy were described and informed consent was obtained.   The anorectum was pre-medicated with 0.125% Nitroglycerine and recticare The decision was made to band the right anterior internal hemorrhoid, and the [place] was used to perform band ligation without complication.  Digital anorectal examination was then performed to assure proper positioning of the band, and to adjust the banded tissue as required.  The patient was discharged home without pain or other issues.  Dietary and behavioral recommendations were given and along with follow-up instructions.     The following adjunctive treatments were recommended: Benefiber 1 tablespoon 3 times daily with meals Increase fluid intake to 10-12 cups of water daily  The patient will return in 2-4 weeks for  follow-up and possible additional banding as required. No complications were encountered and the patient tolerated the procedure well.  Damaris Hippo , MD [phone] Mon-Fri 8a-5p [phone] after 5p, weekends, holidays

## 2017-06-04 NOTE — Patient Instructions (Signed)

## 2017-06-21 ENCOUNTER — Ambulatory Visit: Payer: Self-pay | Admitting: Family Medicine

## 2017-06-26 ENCOUNTER — Encounter: Payer: BLUE CROSS/BLUE SHIELD | Admitting: Gastroenterology

## 2017-06-28 ENCOUNTER — Telehealth: Payer: Self-pay | Admitting: *Deleted

## 2017-06-28 MED ORDER — OMEPRAZOLE 40 MG PO CPDR: 40.0000 mg | DELAYED_RELEASE_CAPSULE | Freq: Two times a day (BID) | ORAL | 3 refills | Status: DC

## 2017-06-28 NOTE — Telephone Encounter (Signed)
Per [facility] from [facility] patients Nexium is no longer covered and her cost is $110 a month.. Omeprazole 40 mg is only a $5 co-pay  Per previous med list she had not tried the Omeprazole 40 mg at twice a day so I sent in a new prescription for her

## 2017-07-02 ENCOUNTER — Encounter: Payer: Self-pay | Admitting: Family Medicine

## 2017-07-02 ENCOUNTER — Ambulatory Visit (INDEPENDENT_AMBULATORY_CARE_PROVIDER_SITE_OTHER): Payer: Managed Care, Other (non HMO) | Admitting: Family Medicine

## 2017-07-02 VITALS — BP 130/58 | HR 67 | Temp 98.5°F | Resp 18 | Ht 60.0 in | Wt 175.8 lb

## 2017-07-02 DIAGNOSIS — E782 Mixed hyperlipidemia: Secondary | ICD-10-CM | POA: Diagnosis not present

## 2017-07-02 DIAGNOSIS — R8290 Unspecified abnormal findings in urine: Secondary | ICD-10-CM | POA: Diagnosis not present

## 2017-07-02 DIAGNOSIS — R739 Hyperglycemia, unspecified: Secondary | ICD-10-CM | POA: Diagnosis not present

## 2017-07-02 DIAGNOSIS — R300 Dysuria: Secondary | ICD-10-CM | POA: Diagnosis not present

## 2017-07-02 DIAGNOSIS — I10 Essential (primary) hypertension: Secondary | ICD-10-CM

## 2017-07-02 LAB — POC URINALSYSI DIPSTICK (AUTOMATED)
Bilirubin, UA: NEGATIVE
GLUCOSE UA: NEGATIVE
Ketones, UA: NEGATIVE
LEUKOCYTES UA: NEGATIVE
NITRITE UA: NEGATIVE
Protein, UA: NEGATIVE
RBC UA: NEGATIVE
Spec Grav, UA: 1.025 (ref 1.010–1.025)
UROBILINOGEN UA: 0.2 U/dL
pH, UA: 6 (ref 5.0–8.0)

## 2017-07-02 MED ORDER — CIPROFLOXACIN HCL 500 MG PO TABS
500.0000 mg | ORAL_TABLET | Freq: Two times a day (BID) | ORAL | 0 refills | Status: DC
Start: 2017-07-02 — End: 2017-08-06

## 2017-07-02 NOTE — Assessment & Plan Note (Signed)
hgba1c acceptable, minimize simple carbs. Increase exercise as tolerated.  

## 2017-07-02 NOTE — Assessment & Plan Note (Signed)
For a couple of weeks urine dip unremarkable

## 2017-07-02 NOTE — Patient Instructions (Signed)

## 2017-07-02 NOTE — Assessment & Plan Note (Signed)
Encouraged heart healthy diet, increase exercise, avoid trans fats, consider a krill oil cap daily 

## 2017-07-02 NOTE — Assessment & Plan Note (Signed)
Well controlled, no changes to meds. Encouraged heart healthy diet such as the DASH diet and exercise as tolerated.  °

## 2017-07-03 LAB — COMPREHENSIVE METABOLIC PANEL
ALBUMIN: 4.1 g/dL (ref 3.5–5.2)
ALK PHOS: 67 U/L (ref 39–117)
ALT: 25 U/L (ref 0–35)
AST: 22 U/L (ref 0–37)
BUN: 12 mg/dL (ref 6–23)
CO2: 33 mEq/L — ABNORMAL HIGH (ref 19–32)
Calcium: 9.5 mg/dL (ref 8.4–10.5)
Chloride: 107 mEq/L (ref 96–112)
Creatinine, Ser: 0.82 mg/dL (ref 0.40–1.20)
GFR: 74.56 mL/min (ref >60.00)
GLUCOSE: 97 mg/dL (ref 70–99)
POTASSIUM: 4.5 meq/L (ref 3.5–5.1)
Sodium: 145 mEq/L (ref 135–145)
TOTAL PROTEIN: 7.2 g/dL (ref 6.0–8.3)
Total Bilirubin: 0.3 mg/dL (ref 0.2–1.2)

## 2017-07-03 LAB — LIPID PANEL
Cholesterol: 148 mg/dL (ref 0–200)
HDL: 60.4 mg/dL (ref >39.00)
LDL CALC: 68 mg/dL (ref 0–99)
NONHDL: 87.99
Total CHOL/HDL Ratio: 2
Triglycerides: 102 mg/dL (ref 0.0–149.0)
VLDL: 20.4 mg/dL (ref 0.0–40.0)

## 2017-07-03 LAB — CBC
HEMATOCRIT: 38.7 % (ref 36.0–46.0)
Hemoglobin: 12.9 g/dL (ref 12.0–15.0)
MCHC: 33.3 g/dL (ref 30.0–36.0)
MCV: 87.7 fl (ref 78.0–100.0)
Platelets: 221 10*3/uL (ref 150.0–400.0)
RBC: 4.42 Mil/uL (ref 3.87–5.11)
RDW: 13.6 % (ref 11.5–15.5)
WBC: 5.8 10*3/uL (ref 4.0–10.5)

## 2017-07-03 LAB — TSH: TSH: 1.8 u[IU]/mL (ref 0.35–4.50)

## 2017-07-03 LAB — HEMOGLOBIN A1C: Hgb A1c MFr Bld: 5.9 % (ref 4.6–6.5)

## 2017-07-08 NOTE — Progress Notes (Signed)
Patient ID: Margaret [name], female   DOB: 1951-01-23, 67 y.o.   MRN: [id number]   Subjective:    Patient ID: Margaret [name], female    DOB: 12-May-1951, 67 y.o.   MRN: [id number]  Chief Complaint  Patient presents with  . Follow-up    Pt wants labs before trip to [place]. Has hyperlipidemia, HTN and hyperglycemia. Reports last tdap was 2014.  . urine odor    Pt reports strong odor to her urine x 1 week and some burning with urination.     HPI Patient is in today for follow-up and lab work prior to leaving the country for vacation.  She feels well today but she has noted some very mild dysuria intermittently over the last few weeks.  Mild malodorous urine.  No hematuria, belly pain or back pain.  No other acute concerns.  No recent febrile illness or hospitalization. Denies CP/palp/SOB/HA/congestion/fevers/GI c/o. Taking meds as prescribed  Past Medical History:  Diagnosis Date  . Allergic state 03/25/2012  . Allergy   . Aortic calcification ([place]) 08/27/2014  . Atherosclerosis of aorta ([place]) 04/12/2014  . Constipation 01/23/2017  . GERD (gastroesophageal reflux disease)   . Hiatal hernia with gastroesophageal reflux 04/27/2010   Qualifier: Diagnosis of  By: Danelle Earthly CMA, Darlene  Failed Omeprazole and Protonix   . Hx of adenomatous polyp of colon 01/23/2017   Colonoscopy 01/16/2017 with 2 polyps one was adenomatous. recommeded follow up in 5 years.performed by Dr Silverio Decamp  . Hyperlipidemia   . Osteopenia 08/31/2012  . Peripheral edema 12/13/2014  . Sleep apnea   . Unspecified sinusitis (chronic) 03/30/2013    Past Surgical History:  Procedure Laterality Date  . [place] STUDY N/A 12/15/2015   Procedure: [place] STUDY;  Surgeon: Mauri Pole, MD;  Location: [facility];  Service: Endoscopy;  Laterality: N/A;  . DILATION AND CURETTAGE OF UTERUS    . ESOPHAGEAL MANOMETRY N/A 12/15/2015   Procedure: ESOPHAGEAL MANOMETRY (EM);  Surgeon: Mauri Pole, MD;  Location: [facility]  ENDOSCOPY;  Service: Endoscopy;  Laterality: N/A;    Family History  Problem Relation Age of Onset  . Stroke Mother   . Lung cancer Father   . Stroke Sister        64  . Thyroid disease Sister   . Diabetes Sister   . Hypertension Sister   . Hyperlipidemia Sister   . Hypertension Sister   . Sleep apnea Son   . Obesity Son     Social History   Socioeconomic History  . Marital status: Married    Spouse name: [name]  . Number of children: 2  . Years of education: 69  . Highest education level: Not on file  Social Needs  . Financial resource strain: Not on file  . Food insecurity - worry: Not on file  . Food insecurity - inability: Not on file  . Transportation needs - medical: Not on file  . Transportation needs - non-medical: Not on file  Occupational History  . Occupation: Optometrist    Comment: retired  Tobacco Use  . Smoking status: Never Smoker  . Smokeless tobacco: Never Used  Substance and Sexual Activity  . Alcohol use: Yes    Alcohol/week: 0.0 oz    Comment: Rarely  . Drug use: No  . Sexual activity: Not on file    Comment: no dietary restrictions, lives with husand, works at [employer]  Other Topics Concern  . Not on file  Social History Narrative  Lives with husband   Caffeine- tea, 2 cups daily    Outpatient Medications Prior to Visit  Medication Sig Dispense Refill  . aspirin 325 MG tablet Take 325 mg by mouth daily.    . Calcium Carbonate Antacid (TUMS PO) Take by mouth as needed.    . fish oil-omega-3 fatty acids 1000 MG capsule Take 2 g by mouth daily.    . Multiple Vitamin (MULTIVITAMIN WITH MINERALS) TABS tablet Take 1 tablet by mouth daily.    Marland Kitchen omeprazole (PRILOSEC) 40 MG capsule Take 1 capsule (40 mg total) by mouth 2 (two) times daily before a meal. 180 capsule 3  . ranitidine (ZANTAC) 150 MG tablet Take 1 tablet (150 mg total) by mouth at bedtime. 30 tablet 3  . rosuvastatin (CRESTOR) 10 MG tablet Take 1 tablet (10 mg total) by mouth daily. 30  tablet 6  . aspirin EC 81 MG tablet Take 1 tablet (81 mg total) by mouth daily. (Patient not taking: Reported on 07/02/2017)    . metoCLOPramide (REGLAN) 5 MG tablet TAKE 1 TABLET (5 MG TOTAL) BY MOUTH EVERY 8 (EIGHT) HOURS AS NEEDED FOR NAUSEA (BEFORE MEALS). (Patient not taking: Reported on 07/02/2017) 90 tablet 0   No facility-administered medications prior to visit.     No Known Allergies  Review of Systems  Constitutional: Negative for fever and malaise/fatigue.  HENT: Negative for congestion.   Eyes: Negative for blurred vision.  Respiratory: Negative for shortness of breath.   Cardiovascular: Negative for chest pain, palpitations and leg swelling.  Gastrointestinal: Negative for abdominal pain, blood in stool and nausea.  Genitourinary: Positive for dysuria and frequency. Negative for flank pain, hematuria and urgency.  Musculoskeletal: Negative for falls.  Skin: Negative for rash.  Neurological: Negative for dizziness, loss of consciousness and headaches.  Endo/Heme/Allergies: Negative for environmental allergies.  Psychiatric/Behavioral: Negative for depression. The patient is not nervous/anxious.        Objective:    Physical Exam  Constitutional: She is oriented to person, place, and time. She appears well-developed and well-nourished. No distress.  HENT:  Head: Normocephalic and atraumatic.  Nose: Nose normal.  Eyes: Right eye exhibits no discharge. Left eye exhibits no discharge.  Neck: Normal range of motion. Neck supple.  Cardiovascular: Normal rate and regular rhythm.  No murmur heard. Pulmonary/Chest: Effort normal and breath sounds normal.  Abdominal: Soft. Bowel sounds are normal. There is no tenderness.  Musculoskeletal: She exhibits no edema.  Neurological: She is alert and oriented to person, place, and time.  Skin: Skin is warm and dry.  Psychiatric: She has a normal mood and affect.  Nursing note and vitals reviewed.   BP (!) 130/58 (BP Location: Right  Arm, Cuff Size: Large)   Pulse 67   Temp 98.5 F (36.9 C) (Oral)   Resp 18   Ht 5' (1.524 m)   Wt 175 lb 12.8 oz (79.7 kg)   SpO2 97%   BMI 34.33 kg/m  Wt Readings from Last 3 Encounters:  07/02/17 175 lb 12.8 oz (79.7 kg)  06/04/17 177 lb (80.3 kg)  04/26/17 176 lb 4 oz (79.9 kg)     Lab Results  Component Value Date   WBC 5.8 07/02/2017   HGB 12.9 07/02/2017   HCT 38.7 07/02/2017   PLT 221.0 07/02/2017   GLUCOSE 97 07/02/2017   CHOL 148 07/02/2017   TRIG 102.0 07/02/2017   HDL 60.40 07/02/2017   LDLCALC 68 07/02/2017   ALT 25 07/02/2017   AST  22 07/02/2017   NA 145 07/02/2017   K 4.5 07/02/2017   CL 107 07/02/2017   CREATININE 0.82 07/02/2017   BUN 12 07/02/2017   CO2 33 (H) 07/02/2017   TSH 1.80 07/02/2017   HGBA1C 5.9 07/02/2017   MICROALBUR 3.06 (H) 05/16/2011    Lab Results  Component Value Date   TSH 1.80 07/02/2017   Lab Results  Component Value Date   WBC 5.8 07/02/2017   HGB 12.9 07/02/2017   HCT 38.7 07/02/2017   MCV 87.7 07/02/2017   PLT 221.0 07/02/2017   Lab Results  Component Value Date   NA 145 07/02/2017   K 4.5 07/02/2017   CO2 33 (H) 07/02/2017   GLUCOSE 97 07/02/2017   BUN 12 07/02/2017   CREATININE 0.82 07/02/2017   BILITOT 0.3 07/02/2017   ALKPHOS 67 07/02/2017   AST 22 07/02/2017   ALT 25 07/02/2017   PROT 7.2 07/02/2017   ALBUMIN 4.1 07/02/2017   CALCIUM 9.5 07/02/2017   ANIONGAP 9 09/06/2016   GFR 74.56 07/02/2017   Lab Results  Component Value Date   CHOL 148 07/02/2017   Lab Results  Component Value Date   HDL 60.40 07/02/2017   Lab Results  Component Value Date   LDLCALC 68 07/02/2017   Lab Results  Component Value Date   TRIG 102.0 07/02/2017   Lab Results  Component Value Date   CHOLHDL 2 07/02/2017   Lab Results  Component Value Date   HGBA1C 5.9 07/02/2017       Assessment & Plan:   Problem List Items Addressed This Visit    Hyperlipidemia, mixed    Encouraged heart healthy diet,  increase exercise, avoid trans fats, consider a krill oil cap daily      Relevant Medications   aspirin 325 MG tablet   Other Relevant Orders   Lipid panel (Completed)   Hyperglycemia    hgba1c acceptable, minimize simple carbs. Increase exercise as tolerated.       Relevant Orders   Hemoglobin A1c (Completed)   Dysuria    For a couple of weeks urine dip unremarkable      Essential hypertension    Well controlled, no changes to meds. Encouraged heart healthy diet such as the DASH diet and exercise as tolerated.       Relevant Medications   aspirin 325 MG tablet   Other Relevant Orders   CBC (Completed)   Comprehensive metabolic panel (Completed)   TSH (Completed)    Other Visit Diagnoses    Abnormal urine odor    -  Primary   Relevant Orders   POCT Urinalysis Dipstick (Automated) (Completed)      I have discontinued Abbegail Cali's aspirin EC and metoCLOPramide. I am also having her start on ciprofloxacin. Additionally, I am having her maintain her fish oil-omega-3 fatty acids, multivitamin with minerals, rosuvastatin, ranitidine, Calcium Carbonate Antacid (TUMS PO), omeprazole, and aspirin.  Meds ordered this encounter  Medications  . ciprofloxacin (CIPRO) 500 MG tablet    Sig: Take 1 tablet (500 mg total) by mouth 2 (two) times daily.    Dispense:  20 tablet    Refill:  0    Penni Homans, MD

## 2017-08-06 ENCOUNTER — Encounter: Payer: Self-pay | Admitting: Gastroenterology

## 2017-08-06 ENCOUNTER — Ambulatory Visit (INDEPENDENT_AMBULATORY_CARE_PROVIDER_SITE_OTHER): Payer: Managed Care, Other (non HMO) | Admitting: Gastroenterology

## 2017-08-06 VITALS — BP 120/60 | HR 68 | Ht 60.0 in | Wt 177.0 lb

## 2017-08-06 DIAGNOSIS — K641 Second degree hemorrhoids: Secondary | ICD-10-CM | POA: Diagnosis not present

## 2017-08-06 NOTE — Patient Instructions (Signed)

## 2017-08-06 NOTE — Progress Notes (Signed)
PROCEDURE NOTE: The patient presents with symptomatic grade II hemorrhoids, requesting rubber band ligation of his/her hemorrhoidal disease.  All risks, benefits and alternative forms of therapy were described and informed consent was obtained.   The anorectum was pre-medicated with 0.125% nitroglycerine and Recticare The decision was made to band the Left lateral internal hemorrhoid, and the [place] was used to perform band ligation without complication.  Digital anorectal examination was then performed to assure proper positioning of the band, and to adjust the banded tissue as required.  The patient was discharged home without pain or other issues.  Dietary and behavioral recommendations were given and along with follow-up instructions.    The patient will return for  follow-up and possible additional banding as required. No complications were encountered and the patient tolerated the procedure well.  Damaris Hippo , MD [phone] Mon-Fri 8a-5p [phone] after 5p, weekends, holidays

## 2017-11-20 ENCOUNTER — Other Ambulatory Visit: Payer: Self-pay | Admitting: Medical

## 2017-11-20 ENCOUNTER — Telehealth: Payer: Self-pay | Admitting: Family Medicine

## 2017-11-20 NOTE — Telephone Encounter (Signed)
Copied from [place] [id number]. Topic: Quick Communication - Rx Refill/Question >> Nov 20, 2017 11:31 AM Boyd Kerbs wrote: Medication:  rosuvastatin (CRESTOR) 10 MG tablet  Has the patient contacted their pharmacy? No. (Agent: If no, request that the patient contact the pharmacy for the refill.) (Agent: If yes, when and what did the pharmacy advise?)  Preferred Pharmacy (with phone number or street name):  [id number] - [place], [place] - [address] AT [address] [place] [address] Phone: [phone] Fax: [phone]    Agent: Please be advised that RX refills may take up to 3 business days. We ask that you follow-up with your pharmacy.

## 2017-11-21 MED ORDER — ROSUVASTATIN CALCIUM 10 MG PO TABS: 10.0000 mg | ORAL_TABLET | Freq: Every day | ORAL | 6 refills | Status: DC

## 2017-11-27 ENCOUNTER — Encounter (HOSPITAL_BASED_OUTPATIENT_CLINIC_OR_DEPARTMENT_OTHER): Payer: Self-pay | Admitting: *Deleted

## 2017-11-27 ENCOUNTER — Other Ambulatory Visit: Payer: Self-pay

## 2017-11-27 ENCOUNTER — Emergency Department (HOSPITAL_BASED_OUTPATIENT_CLINIC_OR_DEPARTMENT_OTHER)
Admission: EM | Admit: 2017-11-27 | Discharge: 2017-11-27 | Disposition: A | Discharge status: Home / Self Care | Payer: Managed Care, Other (non HMO) | Attending: Emergency Medicine | Admitting: Emergency Medicine

## 2017-11-27 DIAGNOSIS — R5383 Other fatigue: Secondary | ICD-10-CM | POA: Insufficient documentation

## 2017-11-27 DIAGNOSIS — G44209 Tension-type headache, unspecified, not intractable: Secondary | ICD-10-CM | POA: Insufficient documentation

## 2017-11-27 DIAGNOSIS — R110 Nausea: Secondary | ICD-10-CM | POA: Diagnosis not present

## 2017-11-27 DIAGNOSIS — Z8673 Personal history of transient ischemic attack (TIA), and cerebral infarction without residual deficits: Secondary | ICD-10-CM | POA: Insufficient documentation

## 2017-11-27 DIAGNOSIS — R51 Headache: Secondary | ICD-10-CM | POA: Diagnosis present

## 2017-11-27 DIAGNOSIS — Z79899 Other long term (current) drug therapy: Secondary | ICD-10-CM | POA: Insufficient documentation

## 2017-11-27 DIAGNOSIS — Z7982 Long term (current) use of aspirin: Secondary | ICD-10-CM | POA: Insufficient documentation

## 2017-11-27 LAB — CBC WITH DIFFERENTIAL/PLATELET
Basophils Absolute: 0 10*3/uL (ref 0.0–0.1)
Basophils Relative: 1 %
Eosinophils Absolute: 0.3 10*3/uL (ref 0.0–0.7)
Eosinophils Relative: 6 %
HCT: 40.3 % (ref 36.0–46.0)
HEMOGLOBIN: 13.5 g/dL (ref 12.0–15.0)
LYMPHS ABS: 2.2 10*3/uL (ref 0.7–4.0)
LYMPHS PCT: 38 %
MCH: 29 pg (ref 26.0–34.0)
MCHC: 33.5 g/dL (ref 30.0–36.0)
MCV: 86.5 fL (ref 78.0–100.0)
Monocytes Absolute: 0.6 10*3/uL (ref 0.1–1.0)
Monocytes Relative: 11 %
NEUTROS PCT: 44 %
Neutro Abs: 2.5 10*3/uL (ref 1.7–7.7)
Platelets: 219 10*3/uL (ref 150–400)
RBC: 4.66 MIL/uL (ref 3.87–5.11)
RDW: 13.6 % (ref 11.5–15.5)
WBC: 5.7 10*3/uL (ref 4.0–10.5)

## 2017-11-27 LAB — BASIC METABOLIC PANEL
Anion gap: 6 (ref 5–15)
BUN: 15 mg/dL (ref 8–23)
CHLORIDE: 107 mmol/L (ref 98–111)
CO2: 28 mmol/L (ref 22–32)
Calcium: 9.2 mg/dL (ref 8.9–10.3)
Creatinine, Ser: 0.59 mg/dL (ref 0.44–1.00)
GFR calc Af Amer: >60 mL/min (ref >60)
GLUCOSE: 96 mg/dL (ref 70–99)
POTASSIUM: 4 mmol/L (ref 3.5–5.1)
SODIUM: 141 mmol/L (ref 135–145)

## 2017-11-27 MED ORDER — DIPHENHYDRAMINE HCL 50 MG/ML IJ SOLN
12.5000 mg | Freq: Once | INTRAMUSCULAR | Status: AC
  Administered 2017-11-27: 12.5 mg via INTRAVENOUS
  Filled 2017-11-27: qty 1

## 2017-11-27 MED ORDER — SODIUM CHLORIDE 0.9 % IV BOLUS
1000.0000 mL | Freq: Once | INTRAVENOUS | Status: AC
  Administered 2017-11-27: 1000 mL via INTRAVENOUS

## 2017-11-27 MED ORDER — PROCHLORPERAZINE EDISYLATE 10 MG/2ML IJ SOLN
5.0000 mg | Freq: Once | INTRAMUSCULAR | Status: AC
  Administered 2017-11-27: 5 mg via INTRAVENOUS
  Filled 2017-11-27: qty 2

## 2017-11-27 NOTE — ED Provider Notes (Signed)
[facility]GENC[facility] DEPARTMENT Provider Note   CSN: [id number] Arrival date & time: 11/27/17  1415     History   Chief Complaint Chief Complaint  Patient presents with  . Headache    HPI Margaret [name] is a 67 y.o. female.  67 yo F headache.  This been going on for the past week.  She is felt rundown fatigued and thirsty as well.  Feels that the pain is at the lower portion of her head.  She had a massage done yesterday for chronic back pain which she thinks made this pain worse.  She feels that the pain now is stretched up the left side of her head.  About a 6 out of 10.  Has some nausea but denies vomiting.  She makes this worse.  She denies photophobia or phonophobia.  Denies trauma.  Denies fevers or neck pain.  The history is provided by the patient.  Headache   This is a new problem. The current episode started more than 1 week ago. The problem occurs constantly. The problem has not changed since onset.Associated with: palpation. The pain is located in the left unilateral region. The quality of the pain is described as dull. The pain is at a severity of 6/10. The pain is mild. The pain does not radiate. Associated symptoms include nausea. Pertinent negatives include no fever, no palpitations, no shortness of breath and no vomiting. She has tried nothing for the symptoms. The treatment provided no relief.    Past Medical History:  Diagnosis Date  . Allergic state 03/25/2012  . Allergy   . Aortic calcification ([place]) 08/27/2014  . Atherosclerosis of aorta ([place]) 04/12/2014  . Constipation 01/23/2017  . GERD (gastroesophageal reflux disease)   . Hiatal hernia with gastroesophageal reflux 04/27/2010   Qualifier: Diagnosis of  By: Danelle Earthly CMA, Darlene  Failed Omeprazole and Protonix   . Hx of adenomatous polyp of colon 01/23/2017   Colonoscopy 01/16/2017 with 2 polyps one was adenomatous. recommeded follow up in 5 years.performed by Dr Silverio Decamp  . Hyperlipidemia   .  Osteopenia 08/31/2012  . Peripheral edema 12/13/2014  . Sleep apnea   . Unspecified sinusitis (chronic) 03/30/2013    Patient Active Problem List   Diagnosis Date Noted  . Hx of adenomatous polyp of colon 01/23/2017  . Constipation 01/23/2017  . Cervical radiculopathy   . 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 TIA (transient ischemic attack) 09/06/2016  . Right hand weakness   . Peripheral edema 12/13/2014  . Atherosclerosis of aorta ([place]) 04/12/2014  . Rash and nonspecific skin eruption 03/31/2014  . Joint stiffness 03/31/2014  . Dysuria 02/08/2014  . Essential hypertension 02/08/2014  . Atypical chest pain 01/21/2014  . Chest pain 07/27/2013  . Hyperglycemia 07/27/2013  . Sinusitis 03/30/2013  . Preventative health care 03/30/2013  . Left shoulder pain 11/14/2012  . Osteopenia 08/31/2012  . Back pain 03/25/2012  . Knee pain 03/25/2012  . Allergic state 03/25/2012  . Helicobacter pylori infection 06/08/2010  . Hiatal hernia with gastroesophageal reflux 04/27/2010  . Hyperlipidemia, mixed 04/07/2010  . Sleep apnea 04/07/2010    Past Surgical History:  Procedure Laterality Date  . [place] STUDY N/A 12/15/2015   Procedure: [place] STUDY;  Surgeon: Mauri Pole, MD;  Location: [facility];  Service: Endoscopy;  Laterality: N/A;  . DILATION AND CURETTAGE OF UTERUS    . ESOPHAGEAL MANOMETRY N/A 12/15/2015   Procedure: ESOPHAGEAL MANOMETRY (EM);  Surgeon: Mauri Pole, MD;  Location: [facility];  Service: Endoscopy;  Laterality: N/A;     OB History   None      Home Medications    Prior to Admission medications   Medication Sig Start Date End Date Taking? Authorizing Provider  aspirin 325 MG tablet Take 325 mg by mouth daily.    [provider]  Calcium Carbonate Antacid (TUMS PO) Take by mouth as needed.    [provider]  fish oil-omega-3 fatty acids 1000 MG capsule Take 2 g by mouth daily.    [provider]  fluticasone  (FLONASE) 50 MCG/ACT nasal spray PLACE 2 SPRAYS INTO BOTH NOSTRILS DAILY. 11/22/17   Saguier, Percell Miller, PA-C  Multiple Vitamin (MULTIVITAMIN WITH MINERALS) TABS tablet Take 1 tablet by mouth daily.    [provider]  ranitidine (ZANTAC) 150 MG tablet Take 1 tablet (150 mg total) by mouth at bedtime. Patient taking differently: Take 150 mg by mouth at bedtime as needed.  02/20/17   Mauri Pole, MD  rosuvastatin (CRESTOR) 10 MG tablet Take 1 tablet (10 mg total) by mouth daily. 11/21/17   Mosie Lukes, MD    Family History Family History  Problem Relation Age of Onset  . Stroke Mother   . Lung cancer Father   . Stroke Sister        59  . Thyroid disease Sister   . Diabetes Sister   . Hypertension Sister   . Hyperlipidemia Sister   . Hypertension Sister   . Sleep apnea Son   . Obesity Son     Social History Social History   Tobacco Use  . Smoking status: Never Smoker  . Smokeless tobacco: Never Used  Substance Use Topics  . Alcohol use: Yes    Alcohol/week: 0.0 oz    Comment: Rarely  . Drug use: No     Allergies   Patient has no known allergies.   Review of Systems Review of Systems  Constitutional: Positive for fatigue. Negative for chills and fever.  HENT: Negative for congestion and rhinorrhea.   Eyes: Negative for redness and visual disturbance.  Respiratory: Negative for shortness of breath and wheezing.   Cardiovascular: Negative for chest pain and palpitations.  Gastrointestinal: Positive for nausea. Negative for vomiting.  Endocrine: Positive for polydipsia.  Genitourinary: Negative for dysuria and urgency.  Musculoskeletal: Negative for arthralgias and myalgias.  Skin: Negative for pallor and wound.  Neurological: Positive for headaches. Negative for dizziness.     Physical Exam Updated Vital Signs BP (!) 164/73 (BP Location: Right Arm)   Pulse 63   Temp 98.2 F (36.8 C) (Oral)   Resp 18   Ht 5\' 1"  (1.549 m)   Wt 81.2 kg (179 lb)    SpO2 99%   BMI 33.82 kg/m   Physical Exam  Constitutional: She is oriented to person, place, and time. She appears well-developed and well-nourished. No distress.  HENT:  Head: Normocephalic and atraumatic.  Eyes: Pupils are equal, round, and reactive to light. EOM are normal.  Neck: Normal range of motion. Neck supple.  Cardiovascular: Normal rate and regular rhythm. Exam reveals no gallop and no friction rub.  No murmur heard. Pulmonary/Chest: Effort normal. She has no wheezes. She has no rales.  Abdominal: Soft. She exhibits no distension. There is no tenderness.  Musculoskeletal: She exhibits no edema or tenderness.  Neurological: She is alert and oriented to person, place, and time. She has normal strength. No cranial nerve deficit or sensory deficit.  She displays a negative Romberg sign. Coordination and gait normal. GCS eye subscore is 4. GCS verbal subscore is 5. GCS motor subscore is 6.  Benign neuro exam  Skin: Skin is warm and dry. She is not diaphoretic.  Psychiatric: She has a normal mood and affect. Her behavior is normal.  Nursing note and vitals reviewed.    ED Treatments / Results  Labs (all labs ordered are listed, but only abnormal results are displayed) Labs Reviewed  CBC WITH DIFFERENTIAL/PLATELET  BASIC METABOLIC PANEL    EKG None  Radiology No results found.  Procedures Procedures (including critical care time)  Medications Ordered in ED Medications  sodium chloride 0.9 % bolus 1,000 mL (0 mLs Intravenous Stopped 11/27/17 1608)  prochlorperazine (COMPAZINE) injection 5 mg (5 mg Intravenous Given 11/27/17 1529)  diphenhydrAMINE (BENADRYL) injection 12.5 mg (12.5 mg Intravenous Given 11/27/17 1530)     Initial Impression / Assessment and Plan / ED Course  I have reviewed the triage vital signs and the nursing notes.  Pertinent labs & imaging results that were available during my care of the patient were reviewed by me and considered in my medical  decision making (see chart for details).     67 yo F with a chief complaint of a headache.  Sounds like a tension headache based on her history.  She has some palpable pain to the attachment of the trapezius as well bilaterally.  Mild pain to the top of the head left of the vertex.  No pain at the TMJ.  No pain with mastication.  No pain over the temporal artery.  With her feelings of general malaise will obtain basic labs.  This after looking at her prior visits to her family physician seems like a chronic complaint.  She has multiple episodes where she has had lab work drawn for the same.  Has had multiple A1c's drawn in the last year and a half. TSH normal.    Will give fluids, headache cocktail, reassess.   Patient feeling much better, labs at baseline.    D/c home.  4:12 PM:  I have discussed the diagnosis/risks/treatment options with the patient and believe the pt to be eligible for discharge home to follow-up with PCP. We also discussed returning to the ED immediately if new or worsening sx occur. We discussed the sx which are most concerning (e.g., sudden worsening pain, fever, inability to tolerate by mouth) that necessitate immediate return. Medications administered to the patient during their visit and any new prescriptions provided to the patient are listed below.  Medications given during this visit Medications  sodium chloride 0.9 % bolus 1,000 mL (0 mLs Intravenous Stopped 11/27/17 1608)  prochlorperazine (COMPAZINE) injection 5 mg (5 mg Intravenous Given 11/27/17 1529)  diphenhydrAMINE (BENADRYL) injection 12.5 mg (12.5 mg Intravenous Given 11/27/17 1530)      The patient appears reasonably screen and/or stabilized for discharge and I doubt any other medical condition or other [facility] requiring further screening, evaluation, or treatment in the ED at this time prior to discharge.    Final Clinical Impressions(s) / ED Diagnoses   Final diagnoses:  Tension headache    ED Discharge  Orders    None       Deno Etienne, DO 11/27/17 1612

## 2017-11-27 NOTE — Discharge Instructions (Signed)
Return for worsening symptoms, fevers neck pain inability to eat or drink.  Please do not take a medication to try and prevent you from having a headache this may actually make you have one.  Follow-up with your family physician within about a week.

## 2017-11-27 NOTE — ED Notes (Signed)
ED Provider at bedside. 

## 2017-11-27 NOTE — ED Triage Notes (Signed)
Pt amb to triage with quick steady gait in nad. Pt reports one week of headache off and on, today is 6/10. Pt states "I just feel worn out and fatigued, and my mouth is so dry." denies fevers, or any other c/o.

## 2017-11-30 ENCOUNTER — Telehealth: Payer: Self-pay

## 2017-11-30 NOTE — Telephone Encounter (Signed)
Copied from [place] [id number]. Topic: Appointment Scheduling - Scheduling Inquiry for Clinic >> Nov 26, 2017 11:06 AM Valla Leaver wrote: Reason for CRM: Patient wants to know if she can be worked in on Dr. Azalee Course schedule before her next available which is 01/01/2018. She is having low back pain and wants to be seen by Dr. Randel Pigg only.     Patient may want to keep 8/6/

## 2017-12-10 ENCOUNTER — Ambulatory Visit: Payer: Managed Care, Other (non HMO) | Admitting: Family Medicine

## 2017-12-11 ENCOUNTER — Encounter: Payer: Self-pay | Admitting: Family Medicine

## 2017-12-11 ENCOUNTER — Ambulatory Visit (INDEPENDENT_AMBULATORY_CARE_PROVIDER_SITE_OTHER): Payer: Managed Care, Other (non HMO) | Admitting: Family Medicine

## 2017-12-11 VITALS — BP 142/70 | HR 71 | Temp 98.1°F | Resp 18 | Wt 186.4 lb

## 2017-12-11 DIAGNOSIS — I10 Essential (primary) hypertension: Secondary | ICD-10-CM

## 2017-12-11 DIAGNOSIS — K760 Fatty (change of) liver, not elsewhere classified: Secondary | ICD-10-CM | POA: Diagnosis not present

## 2017-12-11 DIAGNOSIS — M549 Dorsalgia, unspecified: Secondary | ICD-10-CM

## 2017-12-11 DIAGNOSIS — R109 Unspecified abdominal pain: Secondary | ICD-10-CM | POA: Diagnosis not present

## 2017-12-11 DIAGNOSIS — IMO0002 Reserved for concepts with insufficient information to code with codable children: Secondary | ICD-10-CM

## 2017-12-11 DIAGNOSIS — R739 Hyperglycemia, unspecified: Secondary | ICD-10-CM

## 2017-12-11 DIAGNOSIS — E782 Mixed hyperlipidemia: Secondary | ICD-10-CM | POA: Diagnosis not present

## 2017-12-11 DIAGNOSIS — M542 Cervicalgia: Secondary | ICD-10-CM | POA: Diagnosis not present

## 2017-12-11 DIAGNOSIS — R229 Localized swelling, mass and lump, unspecified: Secondary | ICD-10-CM | POA: Diagnosis not present

## 2017-12-11 MED ORDER — TIZANIDINE HCL 4 MG PO TABS
4.0000 mg | ORAL_TABLET | Freq: Four times a day (QID) | ORAL | 0 refills | Status: DC | PRN
Start: 2017-12-11 — End: 2018-04-03

## 2017-12-11 MED ORDER — RANITIDINE HCL 150 MG PO TABS: 150.0000 mg | ORAL_TABLET | Freq: Every day | ORAL | 3 refills | Status: DC

## 2017-12-11 MED ORDER — PROMETHAZINE HCL 25 MG PO TABS: 25.0000 mg | ORAL_TABLET | Freq: Three times a day (TID) | ORAL | 0 refills | Status: DC | PRN

## 2017-12-11 MED ORDER — ROSUVASTATIN CALCIUM 10 MG PO TABS: 10.0000 mg | ORAL_TABLET | Freq: Every day | ORAL | 6 refills | Status: DC

## 2017-12-11 NOTE — Patient Instructions (Addendum)
Encouraged moist heat and gentle stretching as tolerated. May try NSAIDs and prescription meds as directed and report if symptoms worsen or seek immediate care  Tylenol/Acetaminophen ER 500 mg tabs, 1-2 tabs twice daily Ibuprofen 200 mg to 400 mg twice daily for back pain Try Excedrine Migraine for headaches Can rub neck with lidocaine gel  Back Pain, Adult Many adults have back pain from time to time. Common causes of back pain include:  A strained muscle or ligament.  Wear and tear (degeneration) of the spinal disks.  Arthritis.  A hit to the back.  Back pain can be short-lived (acute) or last a long time (chronic). A physical exam, lab tests, and imaging studies may be done to find the cause of your pain. Follow these instructions at home: Managing pain and stiffness  Take over-the-counter and prescription medicines only as told by your health care provider.  If directed, apply heat to the affected area as often as told by your health care provider. Use the heat source that your health care provider recommends, such as a moist heat pack or a heating pad. ? Place a towel between your skin and the heat source. ? Leave the heat on for 20-30 minutes. ? Remove the heat if your skin turns bright red. This is especially important if you are unable to feel pain, heat, or cold. You have a greater risk of getting burned.  If directed, apply ice to the injured area: ? Put ice in a plastic bag. ? Place a towel between your skin and the bag. ? Leave the ice on for 20 minutes, 2-3 times a day for the first 2-3 days. Activity  Do not stay in bed. Resting more than 1-2 days can delay your recovery.  Take short walks on even surfaces as soon as you are able. Try to increase the length of time you walk each day.  Do not sit, drive, or stand in one place for more than 30 minutes at a time. Sitting or standing for long periods of time can put stress on your back.  Use proper lifting  techniques. When you bend and lift, use positions that put less stress on your back: ? [place] your knees. ? Keep the load close to your body. ? Avoid twisting.  Exercise regularly as told by your health care provider. Exercising will help your back heal faster. This also helps prevent back injuries by keeping muscles strong and flexible.  Your health care provider may recommend that you see a physical therapist. This person can help you come up with a safe exercise program. Do any exercises as told by your physical therapist. Lifestyle  Maintain a healthy weight. Extra weight puts stress on your back and makes it difficult to have good posture.  Avoid activities or situations that make you feel anxious or stressed. Learn ways to manage anxiety and stress. One way to manage stress is through exercise. Stress and anxiety increase muscle tension and can make back pain worse. General instructions  Sleep on a firm mattress in a comfortable position. Try lying on your side with your knees slightly bent. If you lie on your back, put a pillow under your knees.  Follow your treatment plan as told by your health care provider. This may include: ? Cognitive or behavioral therapy. ? Acupuncture or massage therapy. ? Meditation or yoga. Contact a health care provider if:  You have pain that is not relieved with rest or medicine.  You have increasing  pain going down into your legs or buttocks.  Your pain does not improve in 2 weeks.  You have pain at night.  You lose weight.  You have a fever or chills. Get help right away if:  You develop new bowel or bladder control problems.  You have unusual weakness or numbness in your arms or legs.  You develop nausea or vomiting.  You develop abdominal pain.  You feel faint. Summary  Many adults have back pain from time to time. A physical exam, lab tests, and imaging studies may be done to find the cause of your pain.  Use proper lifting  techniques. When you bend and lift, use positions that put less stress on your back.  Take over-the-counter and prescription medicines and apply heat or ice as directed by your health care provider. This information is not intended to replace advice given to you by your health care provider. Make sure you discuss any questions you have with your health care provider. Document Released: 05/15/2005 Document Revised: 06/19/2016 Document Reviewed: 06/19/2016 Elsevier Interactive Patient Education  [employer].

## 2017-12-12 ENCOUNTER — Ambulatory Visit (HOSPITAL_BASED_OUTPATIENT_CLINIC_OR_DEPARTMENT_OTHER)
Admission: RE | Admit: 2017-12-12 | Discharge: 2017-12-12 | Disposition: A | Discharge status: Home / Self Care | Payer: Managed Care, Other (non HMO) | Source: Ambulatory Visit | Attending: Family Medicine | Admitting: Family Medicine

## 2017-12-12 DIAGNOSIS — M545 Low back pain: Secondary | ICD-10-CM | POA: Insufficient documentation

## 2017-12-12 DIAGNOSIS — R229 Localized swelling, mass and lump, unspecified: Principal | ICD-10-CM

## 2017-12-12 DIAGNOSIS — M47817 Spondylosis without myelopathy or radiculopathy, lumbosacral region: Secondary | ICD-10-CM | POA: Insufficient documentation

## 2017-12-12 DIAGNOSIS — M47816 Spondylosis without myelopathy or radiculopathy, lumbar region: Secondary | ICD-10-CM | POA: Insufficient documentation

## 2017-12-12 DIAGNOSIS — M542 Cervicalgia: Secondary | ICD-10-CM

## 2017-12-12 DIAGNOSIS — R222 Localized swelling, mass and lump, trunk: Secondary | ICD-10-CM | POA: Insufficient documentation

## 2017-12-12 DIAGNOSIS — M50322 Other cervical disc degeneration at C5-C6 level: Secondary | ICD-10-CM | POA: Diagnosis not present

## 2017-12-12 DIAGNOSIS — M4802 Spinal stenosis, cervical region: Secondary | ICD-10-CM | POA: Diagnosis not present

## 2017-12-12 DIAGNOSIS — M549 Dorsalgia, unspecified: Secondary | ICD-10-CM

## 2017-12-12 DIAGNOSIS — IMO0002 Reserved for concepts with insufficient information to code with codable children: Secondary | ICD-10-CM

## 2017-12-12 LAB — COMPREHENSIVE METABOLIC PANEL
ALT: 43 U/L — AB (ref 0–35)
AST: 31 U/L (ref 0–37)
Albumin: 4.3 g/dL (ref 3.5–5.2)
Alkaline Phosphatase: 69 U/L (ref 39–117)
BILIRUBIN TOTAL: 0.4 mg/dL (ref 0.2–1.2)
BUN: 12 mg/dL (ref 6–23)
CALCIUM: 9.2 mg/dL (ref 8.4–10.5)
CO2: 30 meq/L (ref 19–32)
Chloride: 105 mEq/L (ref 96–112)
Creatinine, Ser: 0.76 mg/dL (ref 0.40–1.20)
GFR: 81.28 mL/min (ref >60.00)
GLUCOSE: 83 mg/dL (ref 70–99)
Potassium: 4 mEq/L (ref 3.5–5.1)
Sodium: 142 mEq/L (ref 135–145)
Total Protein: 7.3 g/dL (ref 6.0–8.3)

## 2017-12-12 LAB — URINALYSIS
Bilirubin Urine: NEGATIVE
Hgb urine dipstick: NEGATIVE
KETONES UR: NEGATIVE
LEUKOCYTES UA: NEGATIVE
Nitrite: NEGATIVE
Specific Gravity, Urine: <=1.005 — AB (ref 1.000–1.030)
Total Protein, Urine: NEGATIVE
UROBILINOGEN UA: 0.2 (ref 0.0–1.0)
Urine Glucose: NEGATIVE
pH: 7 (ref 5.0–8.0)

## 2017-12-12 LAB — CBC WITH DIFFERENTIAL/PLATELET
BASOS ABS: 0.1 10*3/uL (ref 0.0–0.1)
Basophils Relative: 1.8 % (ref 0.0–3.0)
EOS ABS: 0.4 10*3/uL (ref 0.0–0.7)
Eosinophils Relative: 6.4 % — ABNORMAL HIGH (ref 0.0–5.0)
HCT: 40.4 % (ref 36.0–46.0)
Hemoglobin: 13.3 g/dL (ref 12.0–15.0)
LYMPHS ABS: 3.2 10*3/uL (ref 0.7–4.0)
Lymphocytes Relative: 45.9 % (ref 12.0–46.0)
MCHC: 32.9 g/dL (ref 30.0–36.0)
MCV: 87.9 fl (ref 78.0–100.0)
Monocytes Absolute: 0.8 10*3/uL (ref 0.1–1.0)
Monocytes Relative: 11.8 % (ref 3.0–12.0)
NEUTROS ABS: 2.4 10*3/uL (ref 1.4–7.7)
NEUTROS PCT: 34.1 % — AB (ref 43.0–77.0)
PLATELETS: 237 10*3/uL (ref 150.0–400.0)
RBC: 4.6 Mil/uL (ref 3.87–5.11)
RDW: 14 % (ref 11.5–15.5)
WBC: 7 10*3/uL (ref 4.0–10.5)

## 2017-12-12 LAB — SEDIMENTATION RATE: Sed Rate: 9 mm/hr (ref 0–30)

## 2017-12-12 LAB — HEMOGLOBIN A1C: Hgb A1c MFr Bld: 6.2 % (ref 4.6–6.5)

## 2017-12-13 ENCOUNTER — Other Ambulatory Visit: Payer: Self-pay | Admitting: Family Medicine

## 2017-12-13 DIAGNOSIS — D171 Benign lipomatous neoplasm of skin and subcutaneous tissue of trunk: Secondary | ICD-10-CM

## 2017-12-14 LAB — URINE CULTURE
MICRO NUMBER:: 90846449
SPECIMEN QUALITY:: ADEQUATE

## 2017-12-16 DIAGNOSIS — M542 Cervicalgia: Secondary | ICD-10-CM | POA: Insufficient documentation

## 2017-12-16 DIAGNOSIS — K760 Fatty (change of) liver, not elsewhere classified: Secondary | ICD-10-CM | POA: Insufficient documentation

## 2017-12-16 DIAGNOSIS — R229 Localized swelling, mass and lump, unspecified: Secondary | ICD-10-CM

## 2017-12-16 DIAGNOSIS — IMO0002 Reserved for concepts with insufficient information to code with codable children: Secondary | ICD-10-CM | POA: Insufficient documentation

## 2017-12-16 NOTE — Assessment & Plan Note (Addendum)
Encouraged moist heat and gentle stretching as tolerated. May try NSAIDs and prescription meds as directed and report if symptoms worsen or seek immediate care. Topical treatments prn. Xray confims degenerative changes but patient declines MRI fo now.

## 2017-12-16 NOTE — Assessment & Plan Note (Signed)
hgba1c acceptable, minimize simple carbs. Increase exercise as tolerated.  

## 2017-12-16 NOTE — Assessment & Plan Note (Signed)
Mild elevation of AST and hepatic steatosis noted on ultrasound. Minimize fast food, simple carbohydrates and fatty foods. Stay active and attempt modest weight loss.

## 2017-12-16 NOTE — Assessment & Plan Note (Signed)
Encouraged heart healthy diet, increase exercise, avoid trans fats, consider a krill oil cap daily 

## 2017-12-16 NOTE — Assessment & Plan Note (Signed)
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

## 2017-12-16 NOTE — Assessment & Plan Note (Signed)
Well controlled, no changes to meds. Encouraged heart healthy diet such as the DASH diet and exercise as tolerated.  °

## 2017-12-16 NOTE — Progress Notes (Signed)
Patient ID: Margaret [name], female   DOB: 11-13-1950, 67 y.o.   MRN: [id number]   Subjective:    Patient ID: Margaret [name], female    DOB: [birth year], 67 y.o.   MRN: [id number]  No chief complaint on file.   HPI Patient is in today for evaluation of back and neck pain. She is noting increased pain and stiffness especially in the morning. No falls or trauma. Starting to affect her activities of daily living. No radicular symptoms o weakness. Denies CP/palp/SOB/HA/congestion/fevers/GI or GU c/o. Taking meds as prescribed  Past Medical History:  Diagnosis Date  . Allergic state 03/25/2012  . Allergy   . Aortic calcification ([place]) 08/27/2014  . Atherosclerosis of aorta ([place]) 04/12/2014  . Constipation 01/23/2017  . GERD (gastroesophageal reflux disease)   . Hiatal hernia with gastroesophageal reflux 04/27/2010   Qualifier: Diagnosis of  By: Danelle Earthly CMA, Darlene  Failed Omeprazole and Protonix   . Hx of adenomatous polyp of colon 01/23/2017   Colonoscopy 01/16/2017 with 2 polyps one was adenomatous. recommeded follow up in 5 years.performed by Dr Silverio Decamp  . Hyperlipidemia   . Osteopenia 08/31/2012  . Peripheral edema 12/13/2014  . Sleep apnea   . Unspecified sinusitis (chronic) 03/30/2013    Past Surgical History:  Procedure Laterality Date  . [place] STUDY N/A 12/15/2015   Procedure: [place] STUDY;  Surgeon: Mauri Pole, MD;  Location: [facility];  Service: Endoscopy;  Laterality: N/A;  . DILATION AND CURETTAGE OF UTERUS    . ESOPHAGEAL MANOMETRY N/A 12/15/2015   Procedure: ESOPHAGEAL MANOMETRY (EM);  Surgeon: Mauri Pole, MD;  Location: [facility];  Service: Endoscopy;  Laterality: N/A;    Family History  Problem Relation Age of Onset  . Stroke Mother   . Lung cancer Father   . Stroke Sister        38  . Thyroid disease Sister   . Diabetes Sister   . Hypertension Sister   . Hyperlipidemia Sister   . Hypertension Sister   . Sleep apnea Son   . Obesity  Son     Social History   Socioeconomic History  . Marital status: Married    Spouse name: [name]  . Number of children: 2  . Years of education: 79  . Highest education level: Not on file  Occupational History  . 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 Financial resource strain: Not on file  . Food insecurity:    Worry: Not on file    Inability: Not on file  . Transportation needs:    Medical: Not on file    Non-medical: Not on file  Tobacco Use  . Smoking status: Never Smoker  . Smokeless tobacco: Never Used  Substance and Sexual Activity  . Alcohol use: Yes    Alcohol/week: 0.0 oz    Comment: Rarely  . Drug use: No  . Sexual activity: Not on file    Comment: no dietary restrictions, lives with husand, works at [employer]  . Physical activity:    Days per week: Not on file    Minutes per session: Not on file  . Stress: Not on file  Relationships  . Social connections:    Talks on phone: Not on file    Gets together: Not on file    Attends religious service: Not on file    Active member of club or organization: Not on file    Attends meetings of  clubs or organizations: Not on file    Relationship status: Not on file  . Intimate partner violence:    Fear of current or ex partner: Not on file    Emotionally abused: Not on file    Physically abused: Not on file    Forced sexual activity: Not on file  Other Topics Concern  . Not on file  Social History Narrative   Lives with husband   Caffeine- tea, 2 cups daily    Outpatient Medications Prior to Visit  Medication Sig Dispense Refill  . aspirin 325 MG tablet Take 81 mg by mouth daily.     . Calcium Carbonate Antacid (TUMS PO) Take by mouth as needed.    . fish oil-omega-3 fatty acids 1000 MG capsule Take 2 g by mouth daily.    . Multiple Vitamin (MULTIVITAMIN WITH MINERALS) TABS tablet Take 1 tablet by mouth daily.    Marland Kitchen omeprazole (PRILOSEC) 40 MG capsule Take 40 mg by mouth daily as  needed.    . fluticasone (FLONASE) 50 MCG/ACT nasal spray PLACE 2 SPRAYS INTO BOTH NOSTRILS DAILY. 16 g 1  . ranitidine (ZANTAC) 150 MG tablet Take 1 tablet (150 mg total) by mouth at bedtime. (Patient taking differently: Take 150 mg by mouth at bedtime as needed. ) 30 tablet 3  . rosuvastatin (CRESTOR) 10 MG tablet Take 1 tablet (10 mg total) by mouth daily. 30 tablet 6   No facility-administered medications prior to visit.     No Known Allergies  Review of Systems  Constitutional: Negative for fever and malaise/fatigue.  HENT: Negative for congestion.   Eyes: Negative for blurred vision.  Respiratory: Negative for shortness of breath.   Cardiovascular: Negative for chest pain, palpitations and leg swelling.  Gastrointestinal: Negative for abdominal pain, blood in stool and nausea.  Genitourinary: Negative for dysuria and frequency.  Musculoskeletal: Positive for back pain and neck pain. Negative for falls.  Skin: Negative for rash.  Neurological: Negative for dizziness, loss of consciousness and headaches.  Endo/Heme/Allergies: Negative for environmental allergies.  Psychiatric/Behavioral: Negative for depression. The patient is not nervous/anxious.        Objective:    Physical Exam  Constitutional: She is oriented to person, place, and time. She appears well-developed and well-nourished. No distress.  HENT:  Head: Normocephalic and atraumatic.  Nose: Nose normal.  Eyes: Right eye exhibits no discharge. Left eye exhibits no discharge.  Neck: Normal range of motion. Neck supple.  Cardiovascular: Normal rate and regular rhythm.  No murmur heard. Pulmonary/Chest: Effort normal and breath sounds normal.  Abdominal: Soft. Bowel sounds are normal. There is no tenderness.  Musculoskeletal: She exhibits no edema.  Neurological: She is alert and oriented to person, place, and time.  Skin: Skin is warm and dry.  2-3 cm firm, raised mass at base of cervical spine on left side. None  tender no erythema o fluctuance.   Psychiatric: She has a normal mood and affect.  Nursing note and vitals reviewed.   BP (!) 142/70 (BP Location: Left Arm, Patient Position: Sitting, Cuff Size: Normal)   Pulse 71   Temp 98.1 F (36.7 C) (Oral)   Resp 18   Wt 186 lb 6.4 oz (84.6 kg)   SpO2 97%   BMI 35.22 kg/m  Wt Readings from Last 3 Encounters:  12/11/17 186 lb 6.4 oz (84.6 kg)  11/27/17 179 lb (81.2 kg)  08/06/17 177 lb (80.3 kg)     Lab Results  Component Value Date  WBC 7.0 12/11/2017   HGB 13.3 12/11/2017   HCT 40.4 12/11/2017   PLT 237.0 12/11/2017   GLUCOSE 83 12/11/2017   CHOL 148 07/02/2017   TRIG 102.0 07/02/2017   HDL 60.40 07/02/2017   LDLCALC 68 07/02/2017   ALT 43 (H) 12/11/2017   AST 31 12/11/2017   NA 142 12/11/2017   K 4.0 12/11/2017   CL 105 12/11/2017   CREATININE 0.76 12/11/2017   BUN 12 12/11/2017   CO2 30 12/11/2017   TSH 1.80 07/02/2017   HGBA1C 6.2 12/11/2017   MICROALBUR 3.06 (H) 05/16/2011    Lab Results  Component Value Date   TSH 1.80 07/02/2017   Lab Results  Component Value Date   WBC 7.0 12/11/2017   HGB 13.3 12/11/2017   HCT 40.4 12/11/2017   MCV 87.9 12/11/2017   PLT 237.0 12/11/2017   Lab Results  Component Value Date   NA 142 12/11/2017   K 4.0 12/11/2017   CO2 30 12/11/2017   GLUCOSE 83 12/11/2017   BUN 12 12/11/2017   CREATININE 0.76 12/11/2017   BILITOT 0.4 12/11/2017   ALKPHOS 69 12/11/2017   AST 31 12/11/2017   ALT 43 (H) 12/11/2017   PROT 7.3 12/11/2017   ALBUMIN 4.3 12/11/2017   CALCIUM 9.2 12/11/2017   ANIONGAP 6 11/27/2017   GFR 81.28 12/11/2017   Lab Results  Component Value Date   CHOL 148 07/02/2017   Lab Results  Component Value Date   HDL 60.40 07/02/2017   Lab Results  Component Value Date   LDLCALC 68 07/02/2017   Lab Results  Component Value Date   TRIG 102.0 07/02/2017   Lab Results  Component Value Date   CHOLHDL 2 07/02/2017   Lab Results  Component Value Date    HGBA1C 6.2 12/11/2017       Assessment & Plan:   Problem List Items Addressed This Visit    Hyperlipidemia, mixed    Encouraged heart healthy diet, increase exercise, avoid trans fats, consider a krill oil cap daily      Relevant Medications   rosuvastatin (CRESTOR) 10 MG tablet   Back pain   Relevant Medications   tiZANidine (ZANAFLEX) 4 MG tablet   Other Relevant Orders   DG Lumbar Spine Complete (Completed)   Sedimentation rate (Completed)   Comprehensive metabolic panel (Completed)   CBC with Differential/Platelet (Completed)   Hyperglycemia    hgba1c acceptable, minimize simple carbs. Increase exercise as tolerated.       Relevant Orders   Hemoglobin A1c (Completed)   Essential hypertension    Well controlled, no changes to meds. Encouraged heart healthy diet such as the DASH diet and exercise as tolerated.       Relevant Medications   rosuvastatin (CRESTOR) 10 MG tablet   Other Relevant Orders   Comprehensive metabolic panel (Completed)   CBC with Differential/Platelet (Completed)   Neck pain    Encouraged moist heat and gentle stretching as tolerated. May try NSAIDs and prescription meds as directed and report if symptoms worsen or seek immediate care. Topical treatments prn. Xray confims degenerative changes but patient declines MRI fo now.       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      Relevant Orders   [place] CHEST SOFT TISSUE (Completed)   Fatty infiltration of  liver    Mild elevation of AST and hepatic steatosis noted on ultrasound. Minimize fast food, simple carbohydrates and fatty foods. Stay active and attempt modest weight loss.        Other Visit Diagnoses    Abdominal pain, unspecified abdominal location    -  Primary   Relevant Orders   Urinalysis (Completed)   Urine Culture (Completed)       I have discontinued Jeniah Hamre's fluticasone. I am also having her start on tiZANidine and promethazine. Additionally, I am having her maintain her fish oil-omega-3 fatty acids, multivitamin with minerals, Calcium Carbonate Antacid (TUMS PO), aspirin, omeprazole, rosuvastatin, and ranitidine.  Meds ordered this encounter  Medications  . rosuvastatin (CRESTOR) 10 MG tablet    Sig: Take 1 tablet (10 mg total) by mouth daily.    Dispense:  30 tablet    Refill:  6  . ranitidine (ZANTAC) 150 MG tablet    Sig: Take 1 tablet (150 mg total) by mouth at bedtime.    Dispense:  30 tablet    Refill:  3  . tiZANidine (ZANAFLEX) 4 MG tablet    Sig: Take 1 tablet (4 mg total) by mouth every 6 (six) hours as needed for muscle spasms.    Dispense:  30 tablet    Refill:  0  . promethazine (PHENERGAN) 25 MG tablet    Sig: Take 1 tablet (25 mg total) by mouth every 8 (eight) hours as needed for nausea or vomiting.    Dispense:  20 tablet    Refill:  0     Penni Homans, MD

## 2018-01-01 ENCOUNTER — Ambulatory Visit: Payer: Managed Care, Other (non HMO) | Admitting: Family Medicine

## 2018-01-18 ENCOUNTER — Telehealth: Payer: Self-pay | Admitting: *Deleted

## 2018-01-18 NOTE — Telephone Encounter (Signed)
Chart review indicates pt is due for a 6 week follow up which is scheduled on 01/22/18. Left detailed message on pt's voicemail that she should keep upcoming appt and to call if any questions.

## 2018-01-18 NOTE — Telephone Encounter (Signed)
Copied from [place] [id number]. Topic: Quick Communication - See Telephone Encounter >> Jan 18, 2018  9:43 AM Antonieta Iba C wrote: CRM for notification. See Telephone encounter for: 01/18/18.  Pt called in to be advised about her apt. Pt says that she was just seen by provider and would like to know if she still need to come in for 6 month f/u?  Please advise.   CB: [phone]

## 2018-01-22 ENCOUNTER — Ambulatory Visit (INDEPENDENT_AMBULATORY_CARE_PROVIDER_SITE_OTHER): Payer: Managed Care, Other (non HMO) | Admitting: Family Medicine

## 2018-01-22 DIAGNOSIS — E782 Mixed hyperlipidemia: Secondary | ICD-10-CM | POA: Diagnosis not present

## 2018-01-22 DIAGNOSIS — R739 Hyperglycemia, unspecified: Secondary | ICD-10-CM | POA: Diagnosis not present

## 2018-01-22 DIAGNOSIS — M8580 Other specified disorders of bone density and structure, unspecified site: Secondary | ICD-10-CM | POA: Diagnosis not present

## 2018-01-22 DIAGNOSIS — E669 Obesity, unspecified: Secondary | ICD-10-CM | POA: Insufficient documentation

## 2018-01-22 DIAGNOSIS — M549 Dorsalgia, unspecified: Secondary | ICD-10-CM

## 2018-01-22 NOTE — Assessment & Plan Note (Signed)
Encouraged DASH diet, decrease po intake and increase exercise as tolerated. Needs 7-8 hours of sleep nightly. Avoid trans fats, eat small, frequent meals every 4-5 hours with lean proteins, complex carbs and healthy fats. Minimize simple carbs 

## 2018-01-22 NOTE — Patient Instructions (Signed)

## 2018-01-28 NOTE — Assessment & Plan Note (Signed)
Encouraged moist heat and gentle stretching as tolerated. May try NSAIDs and prescription meds as directed and report if symptoms worsen or seek immediate care 

## 2018-01-28 NOTE — Assessment & Plan Note (Signed)
Encouraged to get adequate exercise, calcium and vitamin d intake 

## 2018-01-28 NOTE — Assessment & Plan Note (Signed)
hgba1c acceptable, minimize simple carbs. Increase exercise as tolerated.  

## 2018-01-28 NOTE — Progress Notes (Signed)
Subjective:    Patient ID: Margaret [name], female    DOB: 25-Aug-1950, 67 y.o.   MRN: [id number]  No chief complaint on file.   HPI Patient is in today for follow up. She is feeling well. No recent febrile illness or hospitalizations. No falls or injury. No radicular symptoms or incontinence. Has some chronic low back pain at times. Also notes some Left upper quadrant pain as well no changes in bowel or bladder habits. No bloody or tarry stool, no change in bowel habits. Denies CP/palp/SOB/HA/congestion/fevers/GI or GU c/o. Taking meds as prescribed  Past Medical History:  Diagnosis Date  . Allergic state 03/25/2012  . Allergy   . Aortic calcification ([place]) 08/27/2014  . Atherosclerosis of aorta ([place]) 04/12/2014  . Constipation 01/23/2017  . GERD (gastroesophageal reflux disease)   . Hiatal hernia with gastroesophageal reflux 04/27/2010   Qualifier: Diagnosis of  By: Danelle Earthly CMA, Darlene  Failed Omeprazole and Protonix   . Hx of adenomatous polyp of colon 01/23/2017   Colonoscopy 01/16/2017 with 2 polyps one was adenomatous. recommeded follow up in 5 years.performed by Dr Silverio Decamp  . Hyperlipidemia   . Osteopenia 08/31/2012  . Peripheral edema 12/13/2014  . Sleep apnea   . Unspecified sinusitis (chronic) 03/30/2013    Past Surgical History:  Procedure Laterality Date  . [place] STUDY N/A 12/15/2015   Procedure: [place] STUDY;  Surgeon: Mauri Pole, MD;  Location: [facility];  Service: Endoscopy;  Laterality: N/A;  . DILATION AND CURETTAGE OF UTERUS    . ESOPHAGEAL MANOMETRY N/A 12/15/2015   Procedure: ESOPHAGEAL MANOMETRY (EM);  Surgeon: Mauri Pole, MD;  Location: [facility];  Service: Endoscopy;  Laterality: N/A;    Family History  Problem Relation Age of Onset  . Stroke Mother   . Lung cancer Father   . Stroke Sister        2  . Thyroid disease Sister   . Diabetes Sister   . Hypertension Sister   . Hyperlipidemia Sister   . Hypertension Sister   .  Sleep apnea Son   . Obesity Son     Social History   Socioeconomic History  . Marital status: Married    Spouse name: [name]  . Number of children: 2  . Years of education: 77  . Highest education level: Not on file  Occupational History  . 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 Financial resource strain: Not on file  . Food insecurity:    Worry: Not on file    Inability: Not on file  . Transportation needs:    Medical: Not on file    Non-medical: Not on file  Tobacco Use  . Smoking status: Never Smoker  . Smokeless tobacco: Never Used  Substance and Sexual Activity  . Alcohol use: Yes    Alcohol/week: 0.0 standard drinks    Comment: Rarely  . Drug use: No  . Sexual activity: Not on file    Comment: no dietary restrictions, lives with husand, works at [employer]  . Physical activity:    Days per week: Not on file    Minutes per session: Not on file  . Stress: Not on file  Relationships  . Social connections:    Talks on phone: Not on file    Gets together: Not on file    Attends religious service: Not on file    Active member of club or organization: Not on file  Attends meetings of clubs or organizations: Not on file    Relationship status: Not on file  . Intimate partner violence:    Fear of current or ex partner: Not on file    Emotionally abused: Not on file    Physically abused: Not on file    Forced sexual activity: Not on file  Other Topics Concern  . Not on file  Social History Narrative   Lives with husband   Caffeine- tea, 2 cups daily    Outpatient Medications Prior to Visit  Medication Sig Dispense Refill  . aspirin 325 MG tablet Take 81 mg by mouth daily.     . Calcium Carbonate Antacid (TUMS PO) Take by mouth as needed.    . fish oil-omega-3 fatty acids 1000 MG capsule Take 2 g by mouth daily.    . Multiple Vitamin (MULTIVITAMIN WITH MINERALS) TABS tablet Take 1 tablet by mouth daily.    Marland Kitchen omeprazole (PRILOSEC) 40  MG capsule Take 40 mg by mouth daily as needed.    . promethazine (PHENERGAN) 25 MG tablet Take 1 tablet (25 mg total) by mouth every 8 (eight) hours as needed for nausea or vomiting. 20 tablet 0  . ranitidine (ZANTAC) 150 MG tablet Take 1 tablet (150 mg total) by mouth at bedtime. 30 tablet 3  . rosuvastatin (CRESTOR) 10 MG tablet Take 1 tablet (10 mg total) by mouth daily. 30 tablet 6  . tiZANidine (ZANAFLEX) 4 MG tablet Take 1 tablet (4 mg total) by mouth every 6 (six) hours as needed for muscle spasms. 30 tablet 0   No facility-administered medications prior to visit.     No Known Allergies  Review of Systems  Constitutional: Negative for fever and malaise/fatigue.  HENT: Negative for congestion.   Eyes: Negative for blurred vision.  Respiratory: Negative for shortness of breath.   Cardiovascular: Negative for chest pain, palpitations and leg swelling.  Gastrointestinal: Negative for abdominal pain, blood in stool and nausea.  Genitourinary: Negative for dysuria and frequency.  Musculoskeletal: Positive for back pain. Negative for falls.  Skin: Negative for rash.  Neurological: Negative for dizziness, loss of consciousness and headaches.  Endo/Heme/Allergies: Negative for environmental allergies.  Psychiatric/Behavioral: Negative for depression. The patient is not nervous/anxious.        Objective:    Physical Exam  Constitutional: She is oriented to person, place, and time. She appears well-developed and well-nourished. No distress.  HENT:  Head: Normocephalic and atraumatic.  Nose: Nose normal.  Eyes: Right eye exhibits no discharge. Left eye exhibits no discharge.  Neck: Normal range of motion. Neck supple.  Cardiovascular: Normal rate and regular rhythm.  No murmur heard. Pulmonary/Chest: Effort normal and breath sounds normal.  Abdominal: Soft. Bowel sounds are normal. There is no tenderness.  Musculoskeletal: She exhibits no edema.  Neurological: She is alert and  oriented to person, place, and time.  Skin: Skin is warm and dry.  Psychiatric: She has a normal mood and affect.  Nursing note and vitals reviewed.   BP 122/78 (BP Location: Left Arm, Patient Position: Sitting, Cuff Size: Normal)   Pulse 63   Temp 98.4 F (36.9 C) (Oral)   Resp 18   Ht 5' (1.524 m)   Wt 186 lb 9.6 oz (84.6 kg)   SpO2 98%   BMI 36.44 kg/m  Wt Readings from Last 3 Encounters:  01/22/18 186 lb 9.6 oz (84.6 kg)  12/11/17 186 lb 6.4 oz (84.6 kg)  11/27/17 179 lb (81.2 kg)  Lab Results  Component Value Date   WBC 7.0 12/11/2017   HGB 13.3 12/11/2017   HCT 40.4 12/11/2017   PLT 237.0 12/11/2017   GLUCOSE 83 12/11/2017   CHOL 148 07/02/2017   TRIG 102.0 07/02/2017   HDL 60.40 07/02/2017   LDLCALC 68 07/02/2017   ALT 43 (H) 12/11/2017   AST 31 12/11/2017   NA 142 12/11/2017   K 4.0 12/11/2017   CL 105 12/11/2017   CREATININE 0.76 12/11/2017   BUN 12 12/11/2017   CO2 30 12/11/2017   TSH 1.80 07/02/2017   HGBA1C 6.2 12/11/2017   MICROALBUR 3.06 (H) 05/16/2011    Lab Results  Component Value Date   TSH 1.80 07/02/2017   Lab Results  Component Value Date   WBC 7.0 12/11/2017   HGB 13.3 12/11/2017   HCT 40.4 12/11/2017   MCV 87.9 12/11/2017   PLT 237.0 12/11/2017   Lab Results  Component Value Date   NA 142 12/11/2017   K 4.0 12/11/2017   CO2 30 12/11/2017   GLUCOSE 83 12/11/2017   BUN 12 12/11/2017   CREATININE 0.76 12/11/2017   BILITOT 0.4 12/11/2017   ALKPHOS 69 12/11/2017   AST 31 12/11/2017   ALT 43 (H) 12/11/2017   PROT 7.3 12/11/2017   ALBUMIN 4.3 12/11/2017   CALCIUM 9.2 12/11/2017   ANIONGAP 6 11/27/2017   GFR 81.28 12/11/2017   Lab Results  Component Value Date   CHOL 148 07/02/2017   Lab Results  Component Value Date   HDL 60.40 07/02/2017   Lab Results  Component Value Date   LDLCALC 68 07/02/2017   Lab Results  Component Value Date   TRIG 102.0 07/02/2017   Lab Results  Component Value Date   CHOLHDL 2  07/02/2017   Lab Results  Component Value Date   HGBA1C 6.2 12/11/2017       Assessment & Plan:   Problem List Items Addressed This Visit    Hyperlipidemia, mixed    Encouraged heart healthy diet, increase exercise, avoid trans fats, consider a krill oil cap day      Back pain    Encouraged moist heat and gentle stretching as tolerated. May try NSAIDs and prescription meds as directed and report if symptoms worsen or seek immediate care      Osteopenia    Encouraged to get adequate exercise, calcium and vitamin d intake      Hyperglycemia    hgba1c acceptable, minimize simple carbs. Increase exercise as tolerated      Morbid obesity ([place])    Encouraged DASH diet, decrease po intake and increase exercise as tolerated. Needs 7-8 hours of sleep nightly. Avoid trans fats, eat small, frequent meals every 4-5 hours with lean proteins, complex carbs and healthy fats. Minimize simple carbs      Relevant Orders   Amb Ref to Medical Weight Management      I am having Breland Wilemon maintain her fish oil-omega-3 fatty acids, multivitamin with minerals, Calcium Carbonate Antacid (TUMS PO), aspirin, omeprazole, rosuvastatin, ranitidine, tiZANidine, and promethazine.  No orders of the defined types were placed in this encounter.    Penni Homans, MD

## 2018-01-28 NOTE — Assessment & Plan Note (Signed)
Encouraged heart healthy diet, increase exercise, avoid trans fats, consider a krill oil cap day

## 2018-03-26 ENCOUNTER — Encounter (INDEPENDENT_AMBULATORY_CARE_PROVIDER_SITE_OTHER): Payer: Self-pay

## 2018-04-03 ENCOUNTER — Ambulatory Visit (INDEPENDENT_AMBULATORY_CARE_PROVIDER_SITE_OTHER): Payer: Managed Care, Other (non HMO) | Admitting: Bariatrics

## 2018-04-03 ENCOUNTER — Encounter (INDEPENDENT_AMBULATORY_CARE_PROVIDER_SITE_OTHER): Payer: Self-pay | Admitting: Bariatrics

## 2018-04-03 VITALS — BP 148/73 | HR 63 | Temp 97.5°F | Ht 61.0 in | Wt 178.0 lb

## 2018-04-03 DIAGNOSIS — Z6833 Body mass index (BMI) 33.0-33.9, adult: Secondary | ICD-10-CM

## 2018-04-03 DIAGNOSIS — G4733 Obstructive sleep apnea (adult) (pediatric): Secondary | ICD-10-CM | POA: Insufficient documentation

## 2018-04-03 DIAGNOSIS — R7303 Prediabetes: Secondary | ICD-10-CM

## 2018-04-03 DIAGNOSIS — I10 Essential (primary) hypertension: Secondary | ICD-10-CM

## 2018-04-03 DIAGNOSIS — E669 Obesity, unspecified: Secondary | ICD-10-CM

## 2018-04-03 DIAGNOSIS — E559 Vitamin D deficiency, unspecified: Secondary | ICD-10-CM

## 2018-04-03 DIAGNOSIS — E7849 Other hyperlipidemia: Secondary | ICD-10-CM

## 2018-04-03 DIAGNOSIS — K760 Fatty (change of) liver, not elsewhere classified: Secondary | ICD-10-CM

## 2018-04-03 DIAGNOSIS — Z1331 Encounter for screening for depression: Secondary | ICD-10-CM

## 2018-04-03 DIAGNOSIS — R0602 Shortness of breath: Secondary | ICD-10-CM | POA: Diagnosis not present

## 2018-04-03 DIAGNOSIS — Z9189 Other specified personal risk factors, not elsewhere classified: Secondary | ICD-10-CM | POA: Diagnosis not present

## 2018-04-03 DIAGNOSIS — R5383 Other fatigue: Secondary | ICD-10-CM

## 2018-04-03 DIAGNOSIS — Z0289 Encounter for other administrative examinations: Secondary | ICD-10-CM

## 2018-04-03 DIAGNOSIS — R0600 Dyspnea, unspecified: Secondary | ICD-10-CM | POA: Insufficient documentation

## 2018-04-03 DIAGNOSIS — E66811 Obesity, class 1: Secondary | ICD-10-CM

## 2018-04-03 NOTE — Progress Notes (Signed)
Office: [phone]  /  Fax: [phone]   Dear Dr. Charlett [name],   Thank you for referring Margaret [name] to our clinic. The following note includes my evaluation and treatment recommendations.  HPI:   Chief Complaint: OBESITY    Margaret [name] has been referred by Margaret Levine A. Margaret Blake, MD for consultation regarding her obesity and obesity related comorbidities.    Margaret [name] (MR# [id number]) is a 67 y.o. female who presents on 04/03/2018 for obesity evaluation and treatment. Current BMI is Body mass index is 33.63 kg/m.Marland Kitchen Margaret [name] has been struggling with her weight for many years and has been unsuccessful in either losing weight, maintaining weight loss, or reaching her healthy weight goal.     Margaret [name] attended our information session and states she is currently in the action stage of change and ready to dedicate time achieving and maintaining a healthier weight. Margaret [name] is interested in becoming our patient and working on intensive lifestyle modifications including (but not limited to) diet, exercise and weight loss.    Margaret [name] states her family eats meals together she thinks her family will eat healthier with  her her desired weight loss is 30 lbs she started gaining weight after childbirth she has significant food cravings issues  she skips meals frequently she has binge eating behaviors she struggles with emotional eating    Margaret [name] feels her energy is lower than it should be. This has worsened with weight gain and has not worsened recently. Margaret [name] admits to daytime somnolence and admits to waking up still tired. Patient has a history of obstructive sleep apnea and wears CPAP. Patent has a history of symptoms of daytime Margaret, morning Margaret, morning headache and hypertension. Patient generally gets 4 or 5 hours of sleep per night, and states they generally have restless sleep. Snoring is present. Apneic episodes are present. Epworth Sleepiness Score is 10  Dyspnea  on exertion Margaret [name] notes increasing shortness of breath with exercising and seems to be worsening over time with weight gain. She notes getting out of breath sooner with activity than she used to. This has not gotten worse recently. Margaret [name] denies orthopnea.  Hyperlipidemia Margaret [name] has hyperlipidemia and she is currently taking Crestor. Margaret [name] is attempting to improve her cholesterol levels with intensive lifestyle modification including a low saturated fat diet, exercise and weight loss. She denies myalgias.  Hypertension Margaret [name] is a 67 y.o. female with hypertension. She is not taking any medications for blood pressure. Her blood pressure is slightly elevated today at 148/73 and her blood pressure at home runs 130/80. Margaret [name] denies chest pain. She is attempting to work on weight loss to help control her blood pressure with the goal of decreasing her risk of heart attack and stroke. Margaret [name] blood pressure is not currently controlled.  Sleep Apnea Margaret [name] has a history of sleep apnea and states she has "trouble sleeping". She wears CPAP most days.  Fatty Liver Margaret [name] has a history of fatty liver and she had an ultrasound in April 2017. Her most recent ultrasound was in April 2018.  Pre-Diabetes Margaret [name] has a diagnosis of prediabetes based on her elevated Hgb A1c of 6.2 on 12/11/17 and she was informed this puts her at greater risk of developing diabetes. She is not taking metformin currently and continues to work on diet and exercise to decrease risk of diabetes. She denies nausea or hypoglycemia.  At risk for diabetes Margaret [name] is at higher than average risk for developing diabetes due to her obesity and prediabetes. She currently  admits polyuria and denies polydipsia.  Vitamin D deficiency Margaret [name] has a diagnosis of vitamin D deficiency. She is currently taking OTC vit D and denies nausea, vomiting or muscle weakness.  Depression Screen Margaret [name]'s Food and Mood  (modified PHQ-9) score was  Depression screen PHQ 2/9 04/03/2018  Decreased Interest 2  Down, Depressed, Hopeless 1  PHQ - 2 Score 3  Altered sleeping 3  Tired, decreased energy 2  Change in appetite 1  Feeling bad or failure about yourself  0  Trouble concentrating 1  Moving slowly or fidgety/restless 0  Suicidal thoughts 0  PHQ-9 Score 10  Difficult doing work/chores Somewhat difficult    ALLERGIES: No Known Allergies  MEDICATIONS: Current Outpatient Medications on File Prior to Visit  Medication Sig Dispense Refill  . aspirin 81 MG chewable tablet Chew by mouth daily.    . Calcium Carbonate Antacid (TUMS PO) Take by mouth as needed.    . fish oil-omega-3 fatty acids 1000 MG capsule Take 2 g by mouth daily.    . Multiple Vitamin (MULTIVITAMIN WITH MINERALS) TABS tablet Take 1 tablet by mouth daily.    Marland Kitchen omeprazole (PRILOSEC) 40 MG capsule Take 40 mg by mouth daily as needed.    . ranitidine (ZANTAC) 150 MG tablet Take 1 tablet (150 mg total) by mouth at bedtime. 30 tablet 3  . rosuvastatin (CRESTOR) 10 MG tablet Take 1 tablet (10 mg total) by mouth daily. 30 tablet 6   No current facility-administered medications on file prior to visit.     PAST MEDICAL HISTORY: Past Medical History:  Diagnosis Date  . Allergic state 03/25/2012  . Allergy   . Aortic calcification ([place]) 08/27/2014  . Atherosclerosis of aorta ([place]) 04/12/2014  . Back pain   . Constipation 01/23/2017  . GERD (gastroesophageal reflux disease)   . Hiatal hernia with gastroesophageal reflux 04/27/2010   Qualifier: Diagnosis of  By: Danelle Earthly CMA, Darlene  Failed Omeprazole and Protonix   . Hx of adenomatous polyp of colon 01/23/2017   Colonoscopy 01/16/2017 with 2 polyps one was adenomatous. recommeded follow up in 5 years.performed by Dr Silverio Decamp  . Hyperlipidemia   . Joint pain   . Osteopenia 08/31/2012  . Peripheral edema 12/13/2014  . Sleep apnea   . Unspecified sinusitis (chronic) 03/30/2013    PAST  SURGICAL HISTORY: Past Surgical History:  Procedure Laterality Date  . [place] STUDY N/A 12/15/2015   Procedure: [place] STUDY;  Surgeon: Mauri Pole, MD;  Location: [facility];  Service: Endoscopy;  Laterality: N/A;  . DILATION AND CURETTAGE OF UTERUS    . ESOPHAGEAL MANOMETRY N/A 12/15/2015   Procedure: ESOPHAGEAL MANOMETRY (EM);  Surgeon: Mauri Pole, MD;  Location: [facility];  Service: Endoscopy;  Laterality: N/A;    SOCIAL HISTORY: Social History   Tobacco Use  . Smoking status: Never Smoker  . Smokeless tobacco: Never Used  Substance Use Topics  . Alcohol use: Yes    Alcohol/week: 0.0 standard drinks    Comment: Rarely  . Drug use: No    FAMILY HISTORY: Family History  Problem Relation Age of Onset  . Stroke Mother   . High blood pressure Mother   . High Cholesterol Mother   . Lung cancer Father   . Stroke Sister        3  . Thyroid disease Sister   . Diabetes Sister   . Hypertension Sister   . Hyperlipidemia Sister   . Hypertension Sister   .  Sleep apnea Son   . Obesity Son     ROS: Review of Systems  Constitutional: Positive for malaise/Margaret.  HENT: Positive for congestion (nasal stuffiness).        + Difficult or Painful Swallowing + Dry Mouth  Eyes:       + Vision Changes + Floaters   Respiratory: Positive for cough and shortness of breath (with activity).   Cardiovascular: Negative for chest pain and orthopnea.       + Leg Cramping  Gastrointestinal: Positive for heartburn. Negative for nausea and vomiting.  Genitourinary: Positive for frequency.  Musculoskeletal: Positive for back pain. Negative for myalgias.       + Muscle or Joint Pain Negative for muscle weakness  Skin:       + Dryness   Neurological: Positive for dizziness and headaches.  Endo/Heme/Allergies: Negative for polydipsia. Bruises/bleeds easily (bruising).       Negative for hypoglycemia  Psychiatric/Behavioral: The patient has insomnia.      PHYSICAL EXAM: Blood pressure (!) 148/73, pulse 63, temperature (!) 97.5 F (36.4 C), temperature source Oral, height 5\' 1"  (1.549 m), weight 178 lb (80.7 kg), SpO2 99 %. Body mass index is 33.63 kg/m. Physical Exam  Constitutional: She is oriented to person, place, and time. She appears well-developed and well-nourished.  HENT:  Head: Normocephalic and atraumatic.  Nose: Nose normal.  Mallanpati = 4  Eyes: EOM are normal. No scleral icterus.  Neck: Normal range of motion. Neck supple. No thyromegaly present.  Cardiovascular: Normal rate and regular rhythm.  Pulmonary/Chest: Effort normal. No respiratory distress.  Abdominal: Soft. There is no tenderness.  + Obesity  Musculoskeletal: Normal range of motion. She exhibits edema (trace edema bilateral lower extremities).  Range of Motion normal in all 4 extremities  Neurological: She is alert and oriented to person, place, and time. Coordination normal.  Skin: Skin is warm and dry.  Psychiatric: She has a normal mood and affect.  Vitals reviewed.   RECENT LABS AND TESTS: BMET    Component Value Date/Time   NA 142 12/11/2017 1825   K 4.0 12/11/2017 1825   CL 105 12/11/2017 1825   CO2 30 12/11/2017 1825   GLUCOSE 83 12/11/2017 1825   BUN 12 12/11/2017 1825   CREATININE 0.76 12/11/2017 1825   CREATININE 0.78 07/21/2016 1627   CALCIUM 9.2 12/11/2017 1825   GFRNONAA >60 11/27/2017 1515   GFRAA >60 11/27/2017 1515   Lab Results  Component Value Date   HGBA1C 6.2 12/11/2017   No results found for: INSULIN CBC    Component Value Date/Time   WBC 7.0 12/11/2017 1825   RBC 4.60 12/11/2017 1825   HGB 13.3 12/11/2017 1825   HCT 40.4 12/11/2017 1825   PLT 237.0 12/11/2017 1825   MCV 87.9 12/11/2017 1825   MCH 29.0 11/27/2017 1515   MCHC 32.9 12/11/2017 1825   RDW 14.0 12/11/2017 1825   LYMPHSABS 3.2 12/11/2017 1825   MONOABS 0.8 12/11/2017 1825   EOSABS 0.4 12/11/2017 1825   BASOSABS 0.1 12/11/2017 1825    Iron/TIBC/Ferritin/ %Sat No results found for: IRON, TIBC, FERRITIN, IRONPCTSAT Lipid Panel     Component Value Date/Time   CHOL 148 07/02/2017 1708   TRIG 102.0 07/02/2017 1708   HDL 60.40 07/02/2017 1708   CHOLHDL 2 07/02/2017 1708   VLDL 20.4 07/02/2017 1708   LDLCALC 68 07/02/2017 1708   Hepatic Function Panel     Component Value Date/Time   PROT 7.3 12/11/2017 1825  ALBUMIN 4.3 12/11/2017 1825   AST 31 12/11/2017 1825   ALT 43 (H) 12/11/2017 1825   ALKPHOS 69 12/11/2017 1825   BILITOT 0.4 12/11/2017 1825   BILIDIR 0.0 02/05/2014 1426   IBILI 0.4 07/21/2013 1050      Component Value Date/Time   TSH 1.80 07/02/2017 1708   TSH 1.28 01/23/2017 1545   TSH 2.06 07/21/2016 1627    ECG  shows NSR with a rate of 62 BPM INDIRECT CALORIMETER done today shows a VO2 of 192 and a REE of 1335.  Her calculated basal metabolic rate is 6045 thus her basal metabolic rate is worse than expected.    ASSESSMENT AND PLAN: Other Margaret - Plan: EKG 12-Lead, Comprehensive metabolic panel  Shortness of breath on exertion  Other hyperlipidemia  Obstructive sleep apnea syndrome  Essential hypertension  Fatty liver - Plan: Comprehensive metabolic panel  Prediabetes - Plan: Hemoglobin A1c, Insulin, random  Vitamin D deficiency - Plan: VITAMIN D 25 Hydroxy (Vit-D Deficiency, Fractures)  Depression screening  At risk for diabetes mellitus  Class 1 obesity with serious comorbidity and body mass index (BMI) of 33.0 to 33.9 in adult, unspecified obesity type  PLAN: Margaret Raelan was informed that her Margaret may be related to obesity, depression or many other causes. Labs will be ordered, and in the meanwhile Santoria has agreed to work on diet, exercise and weight loss to help with Margaret. Proper sleep hygiene was discussed including the need for 7-8 hours of quality sleep each night. A sleep study was not ordered based on symptoms and Epworth score.  Dyspnea on  exertion Nikoleta's shortness of breath appears to be obesity related and exercise induced. She has agreed to work on weight loss and gradually increase exercise over time to treat her exercise induced shortness of breath. If Akisha follows our instructions and loses weight without improvement of her shortness of breath, we will plan to refer to pulmonology. We will monitor this condition regularly. Tanikka agrees to this plan and she will continue wearing her CPAP.  Hyperlipidemia Charle was informed of the American Heart Association Guidelines emphasizing intensive lifestyle modifications as the first line treatment for hyperlipidemia. We discussed many lifestyle modifications today in depth, and Tanequa will continue to work on decreasing saturated fats such as fatty red meat, butter and many fried foods. She will also increase vegetables and lean protein in her diet and continue to work on exercise and weight loss efforts. Kyra will continue Crestor and follow up at the agreed upon time.  Hypertension We discussed sodium restriction, working on healthy weight loss, and a regular exercise program as the means to achieve improved blood pressure control. Doyle agreed with this plan and agreed to follow up as directed. We will continue to monitor her blood pressure as well as her progress with the above lifestyle modifications. She will watch for signs of hypotension as she continues her lifestyle modifications.  Sleep Apnea Kensly will wear her CPAP nightly and she will start to work on diet and weight loss. She will follow up with our clinic in 2 weeks.  Fatty Liver We discussed the diagnosis of fatty liver disease today. Wania was educated on her risk of developing NASH or even liver failure and the only proven treatment for NAFLD was weight loss. Chasiti agreed to start to work on weight loss efforts (5 to 10%) with healthier diet and exercise as an essential part of her  treatment plan. Her provider will follow her fatty  liver and Kedra will follow up with our clinic in 2 weeks.  Pre-Diabetes Alonah will continue to work on weight loss, exercise, and decreasing simple carbohydrates in her diet to help decrease the risk of diabetes. She was informed that eating too many simple carbohydrates or too many calories at one sitting increases the likelihood of GI side effects. We will check Hgb A1c and insulin level today. Nataley agreed to follow up with [place] as directed to monitor her progress.  Diabetes risk counseling Arlyss was given extended (15 minutes) diabetes prevention counseling today. She is 67 y.o. female and has risk factors for diabetes including obesity and pre-diabetes. We discussed intensive lifestyle modifications today with an emphasis on weight loss as well as increasing exercise and decreasing simple carbohydrates in her diet.  Vitamin D Deficiency Avory was informed that low vitamin D levels contributes to Margaret and are associated with obesity, breast, and colon cancer. She agrees to continue to take OTC vitamin D and will follow up for routine testing of vitamin D, at least 2-3 times per year. She was informed of the risk of over-replacement of vitamin D and agrees to not increase her dose unless she discusses this with [place] first. We will check vitamin D level and she will follow up as directed.  Depression Screen Grisela had a moderately positive depression screening. Depression is commonly associated with obesity and often results in emotional eating behaviors. We will monitor this closely and work on CBT to help improve the non-hunger eating patterns. Referral to Psychology may be required if no improvement is seen as she continues in our clinic.  Obesity Lindamarie is currently in the action stage of change and her goal is to continue with weight loss efforts. I recommend Markeia begin the structured treatment plan as follows:  She has  agreed to follow the Category 1 plan Klaire has been instructed to eventually work up to a goal of 150 minutes of combined cardio and strengthening exercise per week for weight loss and overall health benefits. We discussed the following Behavioral Modification Strategies today: increase H2O intake, increasing lean protein intake, decreasing simple carbohydrates, increasing vegetables and work on meal planning and easy cooking plans   She was informed of the importance of frequent follow up visits to maximize her success with intensive lifestyle modifications for her multiple health conditions. She was informed we would discuss her lab results at her next visit unless there is a critical issue that needs to be addressed sooner. Lucero agreed to keep her next visit at the agreed upon time to discuss these results.    OBESITY BEHAVIORAL INTERVENTION VISIT  Today's visit was # 1   Starting weight: 178 lbs Starting date: 04/03/18 Today's weight : 178 lbs Today's date: 04/03/2018 Total lbs lost to date: 0   ASK: We discussed the diagnosis of obesity with [name] today and Shalina agreed to give [place] permission to discuss obesity behavioral modification therapy today.  ASSESS: Gracelin has the diagnosis of obesity and her BMI today is 33.65 Becci is in the action stage of change   ADVISE: Saraphina was educated on the multiple health risks of obesity as well as the benefit of weight loss to improve her health. She was advised of the need for long term treatment and the importance of lifestyle modifications to improve her current health and to decrease her risk of future health problems.  AGREE: Multiple dietary modification options and treatment options were discussed and  Rebeca agreed  to follow the recommendations documented in the above note.  ARRANGE: Calani was educated on the importance of frequent visits to treat obesity as outlined per CMS and USPSTF guidelines and  agreed to schedule her next follow up appointment today.  Corey Skains, am acting as Location manager for [employer]. Owens Shark, DO  I have reviewed the above documentation for accuracy and completeness, and I agree with the above. -Jearld Lesch, DO

## 2018-04-04 LAB — COMPREHENSIVE METABOLIC PANEL
ALT: 51 IU/L — ABNORMAL HIGH (ref 0–32)
AST: 37 IU/L (ref 0–40)
Albumin/Globulin Ratio: 2.1 (ref 1.2–2.2)
Albumin: 4.7 g/dL (ref 3.6–4.8)
Alkaline Phosphatase: 84 IU/L (ref 39–117)
BUN / CREAT RATIO: 19 (ref 12–28)
BUN: 13 mg/dL (ref 8–27)
Bilirubin Total: 0.5 mg/dL (ref 0.0–1.2)
CO2: 25 mmol/L (ref 20–29)
CREATININE: 0.68 mg/dL (ref 0.57–1.00)
Calcium: 9.9 mg/dL (ref 8.7–10.3)
Chloride: 101 mmol/L (ref 96–106)
GFR calc Af Amer: 107 mL/min/{1.73_m2} (ref >59)
GFR calc non Af Amer: 93 mL/min/{1.73_m2} (ref >59)
GLUCOSE: 97 mg/dL (ref 65–99)
Globulin, Total: 2.2 g/dL (ref 1.5–4.5)
Potassium: 4.2 mmol/L (ref 3.5–5.2)
Sodium: 142 mmol/L (ref 134–144)
Total Protein: 6.9 g/dL (ref 6.0–8.5)

## 2018-04-04 LAB — INSULIN, RANDOM: INSULIN: 18.8 u[IU]/mL (ref 2.6–24.9)

## 2018-04-04 LAB — HEMOGLOBIN A1C
ESTIMATED AVERAGE GLUCOSE: 128 mg/dL
Hgb A1c MFr Bld: 6.1 % — ABNORMAL HIGH (ref 4.8–5.6)

## 2018-04-04 LAB — VITAMIN D 25 HYDROXY (VIT D DEFICIENCY, FRACTURES): VIT D 25 HYDROXY: 28.4 ng/mL — AB (ref 30.0–100.0)

## 2018-04-22 ENCOUNTER — Encounter (INDEPENDENT_AMBULATORY_CARE_PROVIDER_SITE_OTHER): Payer: Self-pay | Admitting: Bariatrics

## 2018-04-22 ENCOUNTER — Ambulatory Visit (INDEPENDENT_AMBULATORY_CARE_PROVIDER_SITE_OTHER): Payer: Managed Care, Other (non HMO) | Admitting: Bariatrics

## 2018-04-22 VITALS — BP 113/51 | HR 67 | Temp 97.9°F | Ht 61.0 in | Wt 177.0 lb

## 2018-04-22 DIAGNOSIS — E7849 Other hyperlipidemia: Secondary | ICD-10-CM | POA: Diagnosis not present

## 2018-04-22 DIAGNOSIS — Z9189 Other specified personal risk factors, not elsewhere classified: Secondary | ICD-10-CM | POA: Diagnosis not present

## 2018-04-22 DIAGNOSIS — E559 Vitamin D deficiency, unspecified: Secondary | ICD-10-CM

## 2018-04-22 DIAGNOSIS — E669 Obesity, unspecified: Secondary | ICD-10-CM

## 2018-04-22 DIAGNOSIS — Z6833 Body mass index (BMI) 33.0-33.9, adult: Secondary | ICD-10-CM

## 2018-04-22 MED ORDER — VITAMIN D (ERGOCALCIFEROL) 1.25 MG (50000 UNIT) PO CAPS: 50000.0000 [IU] | ORAL_CAPSULE | ORAL | 0 refills | Status: DC

## 2018-04-29 ENCOUNTER — Encounter (INDEPENDENT_AMBULATORY_CARE_PROVIDER_SITE_OTHER): Payer: Self-pay | Admitting: Bariatrics

## 2018-04-29 NOTE — Progress Notes (Signed)
Office: [phone]  /  Fax: [phone]   HPI:   Chief Complaint: OBESITY Margaret [name] is here to discuss her progress with her obesity treatment plan. She is on the Category 1 plan and is following her eating plan approximately 0 % of the time. She states she is exercising 0 minutes 0 times per week. Margaret [name] has not started the plan due to a death in the family. She has been traveling. She has been hungry after lunch and dinner. She has had some cravings for "something sweet".  Her weight is 177 lb (80.3 kg) today and has had a weight loss of 1 pound over a period of 3 weeks since her last visit. She has lost 1 lb since starting treatment with [place].  Hyperlipidemia Margaret [name] has hyperlipidemia and has been trying to improve her cholesterol levels with intensive lifestyle modification including a low saturated fat diet, exercise and weight loss. She is taking rosuvastatin and denies any myalgias.  Vitamin D deficiency Margaret [name] has a diagnosis of vitamin D deficiency. She is not currently taking vit D and her last vitamin D level was 28.4 on 04/03/18. She denies nausea, vomiting, or muscle weakness.  At risk for osteopenia and osteoporosis Margaret [name] is at higher risk of osteopenia and osteoporosis due to vitamin D deficiency.   Pre-Diabetes Margaret [name] has a diagnosis of pre-diabetes based on her elevated Hgb A1c and was informed this puts her at greater risk of developing diabetes. Her last Hgb A1c was 6.1 and Insulin was 18.8 on 04/03/18. She is not taking metformin currently and continues to work on diet and exercise to decrease risk of diabetes. She admits polyphagia.  History of Fatty Liver Margaret [name] has a history of fatty liver. Her ALT was 51 on 04/03/18. She denies any abdominal pain.  ALLERGIES: No Known Allergies  MEDICATIONS: Current Outpatient Medications on File Prior to Visit  Medication Sig Dispense Refill  . aspirin 81 MG chewable tablet Chew by mouth daily.    . Calcium  Carbonate Antacid (TUMS PO) Take by mouth as needed.    . fish oil-omega-3 fatty acids 1000 MG capsule Take 2 g by mouth daily.    . Multiple Vitamin (MULTIVITAMIN WITH MINERALS) TABS tablet Take 1 tablet by mouth daily.    Marland Kitchen omeprazole (PRILOSEC) 40 MG capsule Take 40 mg by mouth daily as needed.    . ranitidine (ZANTAC) 150 MG tablet Take 1 tablet (150 mg total) by mouth at bedtime. 30 tablet 3  . rosuvastatin (CRESTOR) 10 MG tablet Take 1 tablet (10 mg total) by mouth daily. 30 tablet 6   No current facility-administered medications on file prior to visit.     PAST MEDICAL HISTORY: Past Medical History:  Diagnosis Date  . Allergic state 03/25/2012  . Allergy   . Aortic calcification ([place]) 08/27/2014  . Atherosclerosis of aorta ([place]) 04/12/2014  . Back pain   . Constipation 01/23/2017  . GERD (gastroesophageal reflux disease)   . Hiatal hernia with gastroesophageal reflux 04/27/2010   Qualifier: Diagnosis of  By: Danelle Earthly CMA, Darlene  Failed Omeprazole and Protonix   . Hx of adenomatous polyp of colon 01/23/2017   Colonoscopy 01/16/2017 with 2 polyps one was adenomatous. recommeded follow up in 5 years.performed by Dr Silverio Decamp  . Hyperlipidemia   . Joint pain   . Osteopenia 08/31/2012  . Peripheral edema 12/13/2014  . Sleep apnea   . Unspecified sinusitis (chronic) 03/30/2013    PAST SURGICAL HISTORY: Past Surgical History:  Procedure Laterality Date  .  [place] STUDY N/A 12/15/2015   Procedure: [place] STUDY;  Surgeon: Mauri Pole, MD;  Location: [facility];  Service: Endoscopy;  Laterality: N/A;  . DILATION AND CURETTAGE OF UTERUS    . ESOPHAGEAL MANOMETRY N/A 12/15/2015   Procedure: ESOPHAGEAL MANOMETRY (EM);  Surgeon: Mauri Pole, MD;  Location: [facility];  Service: Endoscopy;  Laterality: N/A;    SOCIAL HISTORY: Social History   Tobacco Use  . Smoking status: Never Smoker  . Smokeless tobacco: Never Used  Substance Use Topics  . Alcohol use: Yes     Alcohol/week: 0.0 standard drinks    Comment: Rarely  . Drug use: No    FAMILY HISTORY: Family History  Problem Relation Age of Onset  . Stroke Mother   . High blood pressure Mother   . High Cholesterol Mother   . Lung cancer Father   . Stroke Sister        20  . Thyroid disease Sister   . Diabetes Sister   . Hypertension Sister   . Hyperlipidemia Sister   . Hypertension Sister   . Sleep apnea Son   . Obesity Son     ROS: Review of Systems  Constitutional: Positive for weight loss.  Gastrointestinal: Negative for abdominal pain, nausea and vomiting.  Musculoskeletal: Negative for myalgias.       Negative for muscle weakness.  Endo/Heme/Allergies:       Positive for polyphagia.    PHYSICAL EXAM: Blood pressure (!) 113/51, pulse 67, temperature 97.9 F (36.6 C), temperature source Oral, height 5\' 1"  (1.549 m), weight 177 lb (80.3 kg), SpO2 96 %. Body mass index is 33.44 kg/m. Physical Exam  Constitutional: She is oriented to person, place, and time. She appears well-developed and well-nourished.  Cardiovascular: Normal rate.  Pulmonary/Chest: Effort normal.  Musculoskeletal: Normal range of motion.  Neurological: She is oriented to person, place, and time.  Skin: Skin is warm and dry.  Psychiatric: She has a normal mood and affect. Her behavior is normal.  Vitals reviewed.   RECENT LABS AND TESTS: BMET    Component Value Date/Time   NA 142 04/03/2018 0838   K 4.2 04/03/2018 0838   CL 101 04/03/2018 0838   CO2 25 04/03/2018 0838   GLUCOSE 97 04/03/2018 0838   GLUCOSE 83 12/11/2017 1825   BUN 13 04/03/2018 0838   CREATININE 0.68 04/03/2018 0838   CREATININE 0.78 07/21/2016 1627   CALCIUM 9.9 04/03/2018 0838   GFRNONAA 93 04/03/2018 0838   GFRAA 107 04/03/2018 0838   Lab Results  Component Value Date   HGBA1C 6.1 (H) 04/03/2018   HGBA1C 6.2 12/11/2017   HGBA1C 5.9 07/02/2017   HGBA1C 6.1 01/23/2017   HGBA1C 6.0 (H) 09/06/2016   Lab Results    Component Value Date   INSULIN 18.8 04/03/2018   CBC    Component Value Date/Time   WBC 7.0 12/11/2017 1825   RBC 4.60 12/11/2017 1825   HGB 13.3 12/11/2017 1825   HCT 40.4 12/11/2017 1825   PLT 237.0 12/11/2017 1825   MCV 87.9 12/11/2017 1825   MCH 29.0 11/27/2017 1515   MCHC 32.9 12/11/2017 1825   RDW 14.0 12/11/2017 1825   LYMPHSABS 3.2 12/11/2017 1825   MONOABS 0.8 12/11/2017 1825   EOSABS 0.4 12/11/2017 1825   BASOSABS 0.1 12/11/2017 1825   Iron/TIBC/Ferritin/ %Sat No results found for: IRON, TIBC, FERRITIN, IRONPCTSAT Lipid Panel     Component Value Date/Time   CHOL 148 07/02/2017  1708   TRIG 102.0 07/02/2017 1708   HDL 60.40 07/02/2017 1708   CHOLHDL 2 07/02/2017 1708   VLDL 20.4 07/02/2017 1708   LDLCALC 68 07/02/2017 1708   Hepatic Function Panel     Component Value Date/Time   PROT 6.9 04/03/2018 0838   ALBUMIN 4.7 04/03/2018 0838   AST 37 04/03/2018 0838   ALT 51 (H) 04/03/2018 0838   ALKPHOS 84 04/03/2018 0838   BILITOT 0.5 04/03/2018 0838   BILIDIR 0.0 02/05/2014 1426   IBILI 0.4 07/21/2013 1050      Component Value Date/Time   TSH 1.80 07/02/2017 1708   TSH 1.28 01/23/2017 1545   TSH 2.06 07/21/2016 1627   Results for MYLEEN, BRAILSFORD (MRN [id number]) as of 04/29/2018 07:59  Ref. Range 04/03/2018 08:38  Vitamin D, 25-Hydroxy Latest Ref Range: 30.0 - 100.0 ng/mL 28.4 (L)   ASSESSMENT AND PLAN: Other hyperlipidemia  Vitamin D deficiency - Plan: Vitamin D, Ergocalciferol, (DRISDOL) 1.25 MG (50000 UT) CAPS capsule  At risk for osteoporosis  Class 1 obesity with serious comorbidity and body mass index (BMI) of 33.0 to 33.9 in adult, unspecified obesity type  PLAN:  Hyperlipidemia Margaret [name] was informed of the American Heart Association Guidelines emphasizing intensive lifestyle modifications as the first line treatment for hyperlipidemia. We discussed many lifestyle modifications today in depth, and Avaiyah will continue to work on decreasing  saturated fats such as fatty red meat, butter and many fried foods. She will also increase vegetables and lean protein in her diet and continue to work on exercise and weight loss efforts. Delesia agreed to continue her medication and follow up in 2 weeks.  Vitamin D Deficiency Margaret [name] was informed that low vitamin D levels contributes to fatigue and are associated with obesity, breast, and colon cancer. She agrees to begin to take prescription Vit D @50 ,000 IU, 1 capsule PO weekly #4 with no refills and will follow up for routine testing of vitamin D, at least 2-3 times per year. She was informed of the risk of over-replacement of vitamin D and agrees to not increase her dose unless she discusses this with [place] first. Azyah agreed with this plan.  At risk for osteopenia and osteoporosis Margaret [name] was given extended (15 minutes) osteoporosis prevention counseling today. Vrinda is at risk for osteopenia and osteoporosis due to her vitamin D deficiency. She was encouraged to take her vitamin D and follow her higher calcium diet and increase strengthening exercise to help strengthen her bones and decrease her risk of osteopenia and osteoporosis.  Pre-Diabetes Margaret [name] will continue to work on weight loss, exercise, and decreasing simple carbohydrates in her diet to help decrease the risk of diabetes. We discussed pre-diabetes and metformin including benefits and risk per myself and our registered dietitian.Marland Kitchen She was informed that eating too many simple carbohydrates or too many calories at one sitting increases the likelihood of GI side effects. Margaret [name] declined metformin for now and a prescription was not written today. Margaret [name] agreed to follow up with [place] as directed to monitor her progress.  History of Fatty Liver Margaret [name] was advised to lose weight of at least 5 to 10% and exercise with resistance bands and cardio and to build up to 150 minutes per day. She agrees with this  plan.  Obesity Margaret [name] is currently in the action stage of change. As such, her goal is to continue with weight loss efforts. She has agreed to follow the Category 1 plan. Margaret [name] has been instructed to work up to  a goal of 150 minutes of combined cardio and strengthening exercise per week for weight loss and overall health benefits. We discussed the following Behavioral Modification Strategies today: increasing lean protein intake, decreasing simple carbohydrates, increasing vegetables, increasing H2O intake, celebration eating strategies, and holiday eating strategies.   Margaret [name] has agreed to follow up with our clinic in 2 weeks. She was informed of the importance of frequent follow up visits to maximize her success with intensive lifestyle modifications for her multiple health conditions.   OBESITY BEHAVIORAL INTERVENTION VISIT  Today's visit was # 2   Starting weight: 178 lbs Starting date: 04/03/18 Today's weight : Weight: 177 lb (80.3 kg)  Today's date: 04/22/2018 Total lbs lost to date: 1  ASK: We discussed the diagnosis of obesity with Margaret [name] today and Margaret [name] agreed to give [place] permission to discuss obesity behavioral modification therapy today.  ASSESS: Margaret [name] has the diagnosis of obesity and her BMI today is 33.46. Jobina is in the action stage of change.   ADVISE: Margaret [name] was educated on the multiple health risks of obesity as well as the benefit of weight loss to improve her health. She was advised of the need for long term treatment and the importance of lifestyle modifications to improve her current health and to decrease her risk of future health problems.  AGREE: Multiple dietary modification options and treatment options were discussed and Margaret [name] agreed to follow the recommendations documented in the above note.  ARRANGE: Margaret [name] was educated on the importance of frequent visits to treat obesity as outlined per CMS and USPSTF guidelines and  agreed to schedule her next follow up appointment today.  I, Marcille Blanco, am acting as Location manager for [employer]. Owens Shark, DO  I have reviewed the above documentation for accuracy and completeness, and I agree with the above. -Jearld Lesch, DO

## 2018-04-30 ENCOUNTER — Encounter: Payer: Self-pay | Admitting: Family Medicine

## 2018-04-30 ENCOUNTER — Ambulatory Visit (INDEPENDENT_AMBULATORY_CARE_PROVIDER_SITE_OTHER): Payer: Managed Care, Other (non HMO) | Admitting: Family Medicine

## 2018-04-30 VITALS — BP 120/66 | HR 60 | Temp 98.1°F | Resp 18 | Ht 61.0 in | Wt 182.0 lb

## 2018-04-30 DIAGNOSIS — E559 Vitamin D deficiency, unspecified: Secondary | ICD-10-CM | POA: Diagnosis not present

## 2018-04-30 DIAGNOSIS — R739 Hyperglycemia, unspecified: Secondary | ICD-10-CM

## 2018-04-30 DIAGNOSIS — Z1239 Encounter for other screening for malignant neoplasm of breast: Secondary | ICD-10-CM | POA: Diagnosis not present

## 2018-04-30 DIAGNOSIS — I10 Essential (primary) hypertension: Secondary | ICD-10-CM | POA: Diagnosis not present

## 2018-04-30 DIAGNOSIS — R51 Headache: Secondary | ICD-10-CM

## 2018-04-30 DIAGNOSIS — E782 Mixed hyperlipidemia: Secondary | ICD-10-CM

## 2018-04-30 DIAGNOSIS — G4733 Obstructive sleep apnea (adult) (pediatric): Secondary | ICD-10-CM

## 2018-04-30 DIAGNOSIS — Z124 Encounter for screening for malignant neoplasm of cervix: Secondary | ICD-10-CM | POA: Diagnosis not present

## 2018-04-30 DIAGNOSIS — R519 Headache, unspecified: Secondary | ICD-10-CM | POA: Insufficient documentation

## 2018-04-30 DIAGNOSIS — Z23 Encounter for immunization: Secondary | ICD-10-CM

## 2018-04-30 DIAGNOSIS — Z0000 Encounter for general adult medical examination without abnormal findings: Secondary | ICD-10-CM

## 2018-04-30 DIAGNOSIS — G44209 Tension-type headache, unspecified, not intractable: Secondary | ICD-10-CM

## 2018-04-30 MED ORDER — ROSUVASTATIN CALCIUM 10 MG PO TABS: 10.0000 mg | ORAL_TABLET | Freq: Every day | ORAL | 6 refills | Status: DC

## 2018-04-30 NOTE — Assessment & Plan Note (Signed)
Well controlled, no changes to meds. Encouraged heart healthy diet such as the DASH diet and exercise as tolerated.  °

## 2018-04-30 NOTE — Patient Instructions (Addendum)
Need copy of advanced directive paperwork Preventive Care 40-64 Years, Female Preventive care refers to lifestyle choices and visits with your health care provider that can promote health and wellness. What does preventive care include?  A yearly physical exam. This is also called an annual well check.  Dental exams once or twice a year.  Routine eye exams. Ask your health care provider how often you should have your eyes checked.  Personal lifestyle choices, including: ? Daily care of your teeth and gums. ? Regular physical activity. ? Eating a healthy diet. ? Avoiding tobacco and drug use. ? Limiting alcohol use. ? Practicing safe sex. ? Taking low-dose aspirin daily starting at age 11. ? Taking vitamin and mineral supplements as recommended by your health care provider. What happens during an annual well check? The services and screenings done by your health care provider during your annual well check will depend on your age, overall health, lifestyle risk factors, and family history of disease. Counseling Your health care provider may ask you questions about your:  Alcohol use.  Tobacco use.  Drug use.  Emotional well-being.  Home and relationship well-being.  Sexual activity.  Eating habits.  Work and work Statistician.  Method of birth control.  Menstrual cycle.  Pregnancy history.  Screening You may have the following tests or measurements:  Height, weight, and BMI.  Blood pressure.  Lipid and cholesterol levels. These may be checked every 5 years, or more frequently if you are over 51 years old.  Skin check.  Lung cancer screening. You may have this screening every year starting at age 50 if you have a 30-pack-year history of smoking and currently smoke or have quit within the past 15 years.  Fecal occult blood test (FOBT) of the stool. You may have this test every year starting at age 48.  Flexible sigmoidoscopy or colonoscopy. You may have a  sigmoidoscopy every 5 years or a colonoscopy every 10 years starting at age 8.  Hepatitis C blood test.  Hepatitis B blood test.  Sexually transmitted disease (STD) testing.  Diabetes screening. This is done by checking your blood sugar (glucose) after you have not eaten for a while (fasting). You may have this done every 1-3 years.  Mammogram. This may be done every 1-2 years. Talk to your health care provider about when you should start having regular mammograms. This may depend on whether you have a family history of breast cancer.  BRCA-related cancer screening. This may be done if you have a family history of breast, ovarian, tubal, or peritoneal cancers.  Pelvic exam and Pap test. This may be done every 3 years starting at age 81. Starting at age 63, this may be done every 5 years if you have a Pap test in combination with an HPV test.  Bone density scan. This is done to screen for osteoporosis. You may have this scan if you are at high risk for osteoporosis.  Discuss your test results, treatment options, and if necessary, the need for more tests with your health care provider. Vaccines Your health care provider may recommend certain vaccines, such as:  Influenza vaccine. This is recommended every year.  Tetanus, diphtheria, and acellular pertussis (Tdap, Td) vaccine. You may need a Td booster every 10 years.  Varicella vaccine. You may need this if you have not been vaccinated.  Zoster vaccine. You may need this after age 28.  Measles, mumps, and rubella (MMR) vaccine. You may need at least one dose of  MMR if you were born in 1957 or later. You may also need a second dose.  Pneumococcal 13-valent conjugate (PCV13) vaccine. You may need this if you have certain conditions and were not previously vaccinated.  Pneumococcal polysaccharide (PPSV23) vaccine. You may need one or two doses if you smoke cigarettes or if you have certain conditions.  Meningococcal vaccine. You may  need this if you have certain conditions.  Hepatitis A vaccine. You may need this if you have certain conditions or if you travel or work in places where you may be exposed to hepatitis A.  Hepatitis B vaccine. You may need this if you have certain conditions or if you travel or work in places where you may be exposed to hepatitis B.  Haemophilus influenzae type b (Hib) vaccine. You may need this if you have certain conditions.  Talk to your health care provider about which screenings and vaccines you need and how often you need them. This information is not intended to replace advice given to you by your health care provider. Make sure you discuss any questions you have with your health care provider. Document Released: 06/11/2015 Document Revised: 02/02/2016 Document Reviewed: 03/16/2015 Elsevier Interactive Patient Education  [employer].

## 2018-04-30 NOTE — Assessment & Plan Note (Signed)
hgba1c acceptable, minimize simple carbs. Increase exercise as tolerated.  

## 2018-04-30 NOTE — Assessment & Plan Note (Addendum)
Uses most nights when she does not use it she feels like her throat is swollen uses CPAP routinely. Offered to refer pulmonary for further

## 2018-04-30 NOTE — Assessment & Plan Note (Addendum)
Supplement and monitor, has been following with healthy weight and wellness.

## 2018-05-01 NOTE — Progress Notes (Signed)
Subjective:    Patient ID: Margaret [name], female    DOB: [birth year], 67 y.o.   MRN: [id number]  No chief complaint on file.   HPI Patient is in today for annual preventative exam and follow-up on chronic medical concerns.  She continues to struggle with vitamin D deficiency, hyperlipidemia, hyperglycemia, obesity and more.  She is following with healthy weight and wellness and while she has not lost weight yet she feels it will be helpful. Her mother-in-law has passed away recently and with the travel and stress she has not been able to stick to the plan ideally.  She is doing well with her activities of daily living.  She denies any recent febrile illness or hospitalizations.  She is trying to maintain a heart healthy diet and hopes to do better in the future.  Continues to use her CPAP nightly but wonders if it is time for reevaluation.  Sleep can be restless at times. Denies CP/palp/SOB/HA/congestion/fevers/GI or GU c/o. Taking meds as prescribed Past Medical History:  Diagnosis Date  . Allergic state 03/25/2012  . Allergy   . Aortic calcification ([place]) 08/27/2014  . Atherosclerosis of aorta ([place]) 04/12/2014  . Back pain   . Constipation 01/23/2017  . GERD (gastroesophageal reflux disease)   . Hiatal hernia with gastroesophageal reflux 04/27/2010   Qualifier: Diagnosis of  By: Danelle Earthly CMA, Darlene  Failed Omeprazole and Protonix   . Hx of adenomatous polyp of colon 01/23/2017   Colonoscopy 01/16/2017 with 2 polyps one was adenomatous. recommeded follow up in 5 years.performed by Dr Silverio Decamp  . Hyperlipidemia   . Joint pain   . Osteopenia 08/31/2012  . Peripheral edema 12/13/2014  . Sleep apnea   . Unspecified sinusitis (chronic) 03/30/2013    Past Surgical History:  Procedure Laterality Date  . [place] STUDY N/A 12/15/2015   Procedure: [place] STUDY;  Surgeon: Mauri Pole, MD;  Location: [facility];  Service: Endoscopy;  Laterality: N/A;  . DILATION AND CURETTAGE OF  UTERUS    . ESOPHAGEAL MANOMETRY N/A 12/15/2015   Procedure: ESOPHAGEAL MANOMETRY (EM);  Surgeon: Mauri Pole, MD;  Location: [facility];  Service: Endoscopy;  Laterality: N/A;    Family History  Problem Relation Age of Onset  . Stroke Mother   . High blood pressure Mother   . High Cholesterol Mother   . Lung cancer Father   . Stroke Sister        63  . Thyroid disease Sister   . Diabetes Sister   . Hypertension Sister   . Hyperlipidemia Sister   . Hypertension Sister   . Sleep apnea Son   . Obesity Son     Social History   Socioeconomic History  . Marital status: Married    Spouse name: [name]  . Number of children: 2  . Years of education: 64  . Highest education level: Not on file  Occupational History  . 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 Financial resource strain: Not on file  . Food insecurity:    Worry: Not on file    Inability: Not on file  . Transportation needs:    Medical: Not on file    Non-medical: Not on file  Tobacco Use  . Smoking status: Never Smoker  . Smokeless tobacco: Never Used  Substance and Sexual Activity  . Alcohol use: Yes    Alcohol/week: 0.0 standard drinks    Comment: Rarely  . Drug  use: No  . Sexual activity: Not on file    Comment: no dietary restrictions, lives with husand, works at [employer]  . Physical activity:    Days per week: Not on file    Minutes per session: Not on file  . Stress: Not on file  Relationships  . Social connections:    Talks on phone: Not on file    Gets together: Not on file    Attends religious service: Not on file    Active member of club or organization: Not on file    Attends meetings of clubs or organizations: Not on file    Relationship status: Not on file  . Intimate partner violence:    Fear of current or ex partner: Not on file    Emotionally abused: Not on file    Physically abused: Not on file    Forced sexual activity: Not on file  Other Topics  Concern  . Not on file  Social History Narrative   Lives with husband   Caffeine- tea, 2 cups daily    Outpatient Medications Prior to Visit  Medication Sig Dispense Refill  . aspirin 81 MG chewable tablet Chew by mouth daily.    . Calcium Carbonate Antacid (TUMS PO) Take by mouth as needed.    . fish oil-omega-3 fatty acids 1000 MG capsule Take 2 g by mouth daily.    . Multiple Vitamin (MULTIVITAMIN WITH MINERALS) TABS tablet Take 1 tablet by mouth daily.    Marland Kitchen omeprazole (PRILOSEC) 40 MG capsule Take 40 mg by mouth daily as needed.    . ranitidine (ZANTAC) 150 MG tablet Take 1 tablet (150 mg total) by mouth at bedtime. 30 tablet 3  . Vitamin D, Ergocalciferol, (DRISDOL) 1.25 MG (50000 UT) CAPS capsule Take 1 capsule (50,000 Units total) by mouth every 7 (seven) days. 4 capsule 0  . rosuvastatin (CRESTOR) 10 MG tablet Take 1 tablet (10 mg total) by mouth daily. 30 tablet 6   No facility-administered medications prior to visit.     No Known Allergies  Review of Systems  Constitutional: Positive for malaise/fatigue. Negative for chills and fever.  HENT: Negative for congestion and hearing loss.   Eyes: Negative for discharge.  Respiratory: Negative for cough, sputum production and shortness of breath.   Cardiovascular: Negative for chest pain, palpitations and leg swelling.  Gastrointestinal: Negative for abdominal pain, blood in stool, constipation, diarrhea, heartburn, nausea and vomiting.  Genitourinary: Negative for dysuria, frequency, hematuria and urgency.  Musculoskeletal: Positive for joint pain. Negative for back pain, falls and myalgias.  Skin: Negative for rash.  Neurological: Negative for dizziness, sensory change, loss of consciousness, weakness and headaches.  Endo/Heme/Allergies: Negative for environmental allergies. Does not bruise/bleed easily.  Psychiatric/Behavioral: Negative for depression and suicidal ideas. The patient is not nervous/anxious and does not have  insomnia.        Objective:    Physical Exam  Constitutional: She is oriented to person, place, and time. No distress.  HENT:  Head: Normocephalic and atraumatic.  Right Ear: External ear normal.  Left Ear: External ear normal.  Nose: Nose normal.  Mouth/Throat: Oropharynx is clear and moist. No oropharyngeal exudate.  Eyes: Pupils are equal, round, and reactive to light. Conjunctivae are normal. Right eye exhibits no discharge. Left eye exhibits no discharge. No scleral icterus.  Neck: Normal range of motion. Neck supple. No thyromegaly present.  Cardiovascular: Normal rate, regular rhythm, normal heart sounds and intact distal pulses.  No  murmur heard. Pulmonary/Chest: Effort normal and breath sounds normal. No respiratory distress. She has no wheezes. She has no rales.  Abdominal: Soft. Bowel sounds are normal. She exhibits no distension and no mass. There is no tenderness.  Musculoskeletal: Normal range of motion. She exhibits no edema or tenderness.  Lymphadenopathy:    She has no cervical adenopathy.  Neurological: She is alert and oriented to person, place, and time. She has normal reflexes. She displays normal reflexes. No cranial nerve deficit. Coordination normal.  Skin: Skin is warm and dry. No rash noted. She is not diaphoretic.    BP 120/66 (BP Location: Left Arm, Patient Position: Sitting, Cuff Size: Normal)   Pulse 60   Temp 98.1 F (36.7 C) (Oral)   Resp 18   Ht 5\' 1"  (1.549 m)   Wt 182 lb (82.6 kg)   SpO2 97%   BMI 34.39 kg/m  Wt Readings from Last 3 Encounters:  04/30/18 182 lb (82.6 kg)  04/22/18 177 lb (80.3 kg)  04/03/18 178 lb (80.7 kg)     Lab Results  Component Value Date   WBC 7.0 12/11/2017   HGB 13.3 12/11/2017   HCT 40.4 12/11/2017   PLT 237.0 12/11/2017   GLUCOSE 97 04/03/2018   CHOL 148 07/02/2017   TRIG 102.0 07/02/2017   HDL 60.40 07/02/2017   LDLCALC 68 07/02/2017   ALT 51 (H) 04/03/2018   AST 37 04/03/2018   NA 142 04/03/2018     K 4.2 04/03/2018   CL 101 04/03/2018   CREATININE 0.68 04/03/2018   BUN 13 04/03/2018   CO2 25 04/03/2018   TSH 1.80 07/02/2017   HGBA1C 6.1 (H) 04/03/2018   MICROALBUR 3.06 (H) 05/16/2011    Lab Results  Component Value Date   TSH 1.80 07/02/2017   Lab Results  Component Value Date   WBC 7.0 12/11/2017   HGB 13.3 12/11/2017   HCT 40.4 12/11/2017   MCV 87.9 12/11/2017   PLT 237.0 12/11/2017   Lab Results  Component Value Date   NA 142 04/03/2018   K 4.2 04/03/2018   CO2 25 04/03/2018   GLUCOSE 97 04/03/2018   BUN 13 04/03/2018   CREATININE 0.68 04/03/2018   BILITOT 0.5 04/03/2018   ALKPHOS 84 04/03/2018   AST 37 04/03/2018   ALT 51 (H) 04/03/2018   PROT 6.9 04/03/2018   ALBUMIN 4.7 04/03/2018   CALCIUM 9.9 04/03/2018   ANIONGAP 6 11/27/2017   GFR 81.28 12/11/2017   Lab Results  Component Value Date   CHOL 148 07/02/2017   Lab Results  Component Value Date   HDL 60.40 07/02/2017   Lab Results  Component Value Date   LDLCALC 68 07/02/2017   Lab Results  Component Value Date   TRIG 102.0 07/02/2017   Lab Results  Component Value Date   CHOLHDL 2 07/02/2017   Lab Results  Component Value Date   HGBA1C 6.1 (H) 04/03/2018       Assessment & Plan:   Problem List Items Addressed This Visit    Hyperlipidemia, mixed    Tolerating statin, encouraged heart healthy diet, avoid trans fats, minimize simple carbs and saturated fats. Increase exercise as tolerated      Relevant Medications   rosuvastatin (CRESTOR) 10 MG tablet   Sleep apnea    Uses most nights when she does not use it she feels like her throat is swollen uses CPAP routinely. Offered to refer pulmonary for further      Preventative  health care    Patient encouraged to maintain heart healthy diet, regular exercise, adequate sleep. Consider daily probiotics. Take medications as prescribed . MGM ordered and referred to GYN for pap, encouraged to provide [place] with copies of Advanced  directives.      Hyperglycemia    hgba1c acceptable, minimize simple carbs. Increase exercise as tolerated.       Essential hypertension    Well controlled, no changes to meds. Encouraged heart healthy diet such as the DASH diet and exercise as tolerated.       Relevant Medications   rosuvastatin (CRESTOR) 10 MG tablet   Vitamin D deficiency    Supplement and monitor, has been following with healthy weight and wellness.      Headache    Other Visit Diagnoses    Breast cancer screening    -  Primary   Relevant Orders   MM 3D SCREEN BREAST BILATERAL   Cervical cancer screening       Relevant Orders   Ambulatory referral to Obstetrics / Gynecology      I am having Messina Angelo maintain her fish oil-omega-3 fatty acids, multivitamin with minerals, Calcium Carbonate Antacid (TUMS PO), omeprazole, ranitidine, aspirin, Vitamin D (Ergocalciferol), and rosuvastatin.  Meds ordered this encounter  Medications  . rosuvastatin (CRESTOR) 10 MG tablet    Sig: Take 1 tablet (10 mg total) by mouth daily.    Dispense:  30 tablet    Refill:  6     Penni Homans, MD

## 2018-05-01 NOTE — Assessment & Plan Note (Signed)
Tolerating statin, encouraged heart healthy diet, avoid trans fats, minimize simple carbs and saturated fats. Increase exercise as tolerated 

## 2018-05-01 NOTE — Assessment & Plan Note (Signed)
Patient encouraged to maintain heart healthy diet, regular exercise, adequate sleep. Consider daily probiotics. Take medications as prescribed . MGM ordered and referred to GYN for pap, encouraged to provide [place] with copies of Advanced directives.

## 2018-05-04 ENCOUNTER — Ambulatory Visit (HOSPITAL_BASED_OUTPATIENT_CLINIC_OR_DEPARTMENT_OTHER)
Admission: RE | Admit: 2018-05-04 | Discharge: 2018-05-04 | Disposition: A | Discharge status: Home / Self Care | Payer: Managed Care, Other (non HMO) | Source: Ambulatory Visit | Attending: Family Medicine | Admitting: Family Medicine

## 2018-05-04 DIAGNOSIS — Z1239 Encounter for other screening for malignant neoplasm of breast: Secondary | ICD-10-CM | POA: Insufficient documentation

## 2018-05-07 ENCOUNTER — Ambulatory Visit (INDEPENDENT_AMBULATORY_CARE_PROVIDER_SITE_OTHER): Payer: Managed Care, Other (non HMO) | Admitting: Bariatrics

## 2018-05-07 ENCOUNTER — Encounter (INDEPENDENT_AMBULATORY_CARE_PROVIDER_SITE_OTHER): Payer: Self-pay | Admitting: Bariatrics

## 2018-05-07 VITALS — BP 131/72 | HR 64 | Temp 97.9°F | Ht 61.0 in | Wt 175.0 lb

## 2018-05-07 DIAGNOSIS — R7303 Prediabetes: Secondary | ICD-10-CM

## 2018-05-07 DIAGNOSIS — E669 Obesity, unspecified: Secondary | ICD-10-CM | POA: Diagnosis not present

## 2018-05-07 DIAGNOSIS — Z9189 Other specified personal risk factors, not elsewhere classified: Secondary | ICD-10-CM

## 2018-05-07 DIAGNOSIS — E559 Vitamin D deficiency, unspecified: Secondary | ICD-10-CM | POA: Diagnosis not present

## 2018-05-07 DIAGNOSIS — Z6833 Body mass index (BMI) 33.0-33.9, adult: Secondary | ICD-10-CM

## 2018-05-07 MED ORDER — VITAMIN D (ERGOCALCIFEROL) 1.25 MG (50000 UNIT) PO CAPS: 50000.0000 [IU] | ORAL_CAPSULE | ORAL | 0 refills | Status: DC

## 2018-05-08 NOTE — Progress Notes (Signed)
Office: [phone]  /  Fax: [phone]   HPI:   Chief Complaint: OBESITY Margaret [name] is here to discuss her progress with her obesity treatment plan. She is on the Category 1 plan and is following her eating plan approximately 50 % of the time. She states she is exercising 0 minutes 0 times per week. Margaret [name] is doing well on the Category 1 plan. She decreased her calories an fat and she continues to get adequate protein. She is not inappropriately hungry. Her weight is 175 lb (79.4 kg) today and has had a weight loss of 2 pounds over a period of 2 weeks since her last visit. She has lost 3 lbs since starting treatment with [place].  Pre-Diabetes Margaret [name] has a diagnosis of prediabetes based on her elevated Hgb A1c of 6.1 and insulin level of 18.8 and was informed this puts her at greater risk of developing diabetes. Margaret [name] declined metformin at the last visit. She continues to work on diet and exercise to decrease the risk of diabetes. She denies nausea or hypoglycemia.  At risk for diabetes Margaret [name] is at higher than average risk for developing diabetes due to her obesity and prediabetes. She currently denies polyuria or polydipsia.  Vitamin D deficiency Margaret [name] has a diagnosis of vitamin D deficiency. She is currently taking high dose prescription vit D and denies nausea, vomiting or muscle weakness.  ALLERGIES: No Known Allergies  MEDICATIONS: Current Outpatient Medications on File Prior to Visit  Medication Sig Dispense Refill  . aspirin 81 MG chewable tablet Chew by mouth daily.    . Calcium Carbonate Antacid (TUMS PO) Take by mouth as needed.    . fish oil-omega-3 fatty acids 1000 MG capsule Take 2 g by mouth daily.    . Multiple Vitamin (MULTIVITAMIN WITH MINERALS) TABS tablet Take 1 tablet by mouth daily.    Marland Kitchen omeprazole (PRILOSEC) 40 MG capsule Take 40 mg by mouth daily as needed.    . ranitidine (ZANTAC) 150 MG tablet Take 1 tablet (150 mg total) by mouth at bedtime. 30  tablet 3  . rosuvastatin (CRESTOR) 10 MG tablet Take 1 tablet (10 mg total) by mouth daily. 30 tablet 6   No current facility-administered medications on file prior to visit.     PAST MEDICAL HISTORY: Past Medical History:  Diagnosis Date  . Allergic state 03/25/2012  . Allergy   . Aortic calcification ([place]) 08/27/2014  . Atherosclerosis of aorta ([place]) 04/12/2014  . Back pain   . Constipation 01/23/2017  . GERD (gastroesophageal reflux disease)   . Hiatal hernia with gastroesophageal reflux 04/27/2010   Qualifier: Diagnosis of  By: Danelle Earthly CMA, Darlene  Failed Omeprazole and Protonix   . Hx of adenomatous polyp of colon 01/23/2017   Colonoscopy 01/16/2017 with 2 polyps one was adenomatous. recommeded follow up in 5 years.performed by Dr Silverio Decamp  . Hyperlipidemia   . Joint pain   . Osteopenia 08/31/2012  . Peripheral edema 12/13/2014  . Sleep apnea   . Unspecified sinusitis (chronic) 03/30/2013    PAST SURGICAL HISTORY: Past Surgical History:  Procedure Laterality Date  . [place] STUDY N/A 12/15/2015   Procedure: [place] STUDY;  Surgeon: Mauri Pole, MD;  Location: [facility];  Service: Endoscopy;  Laterality: N/A;  . DILATION AND CURETTAGE OF UTERUS    . ESOPHAGEAL MANOMETRY N/A 12/15/2015   Procedure: ESOPHAGEAL MANOMETRY (EM);  Surgeon: Mauri Pole, MD;  Location: [facility];  Service: Endoscopy;  Laterality: N/A;  SOCIAL HISTORY: Social History   Tobacco Use  . Smoking status: Never Smoker  . Smokeless tobacco: Never Used  Substance Use Topics  . Alcohol use: Yes    Alcohol/week: 0.0 standard drinks    Comment: Rarely  . Drug use: No    FAMILY HISTORY: Family History  Problem Relation Age of Onset  . Stroke Mother   . High blood pressure Mother   . High Cholesterol Mother   . Lung cancer Father   . Stroke Sister        7  . Thyroid disease Sister   . Diabetes Sister   . Hypertension Sister   . Hyperlipidemia Sister   . Hypertension  Sister   . Sleep apnea Son   . Obesity Son     ROS: Review of Systems  Constitutional: Positive for weight loss.  Gastrointestinal: Negative for nausea and vomiting.  Genitourinary: Negative for frequency.  Musculoskeletal:       Negative for muscle weakness  Endo/Heme/Allergies: Negative for polydipsia.       Negative for hypoglycemia    PHYSICAL EXAM: Blood pressure 131/72, pulse 64, temperature 97.9 F (36.6 C), temperature source Oral, height 5\' 1"  (1.549 m), weight 175 lb (79.4 kg), SpO2 99 %. Body mass index is 33.07 kg/m. Physical Exam  Constitutional: She is oriented to person, place, and time. She appears well-developed and well-nourished.  Cardiovascular: Normal rate.  Pulmonary/Chest: Effort normal.  Musculoskeletal: Normal range of motion.  Neurological: She is oriented to person, place, and time.  Skin: Skin is warm and dry.  Psychiatric: She has a normal mood and affect. Her behavior is normal.  Vitals reviewed.   RECENT LABS AND TESTS: BMET    Component Value Date/Time   NA 142 04/03/2018 0838   K 4.2 04/03/2018 0838   CL 101 04/03/2018 0838   CO2 25 04/03/2018 0838   GLUCOSE 97 04/03/2018 0838   GLUCOSE 83 12/11/2017 1825   BUN 13 04/03/2018 0838   CREATININE 0.68 04/03/2018 0838   CREATININE 0.78 07/21/2016 1627   CALCIUM 9.9 04/03/2018 0838   GFRNONAA 93 04/03/2018 0838   GFRAA 107 04/03/2018 0838   Lab Results  Component Value Date   HGBA1C 6.1 (H) 04/03/2018   HGBA1C 6.2 12/11/2017   HGBA1C 5.9 07/02/2017   HGBA1C 6.1 01/23/2017   HGBA1C 6.0 (H) 09/06/2016   Lab Results  Component Value Date   INSULIN 18.8 04/03/2018   CBC    Component Value Date/Time   WBC 7.0 12/11/2017 1825   RBC 4.60 12/11/2017 1825   HGB 13.3 12/11/2017 1825   HCT 40.4 12/11/2017 1825   PLT 237.0 12/11/2017 1825   MCV 87.9 12/11/2017 1825   MCH 29.0 11/27/2017 1515   MCHC 32.9 12/11/2017 1825   RDW 14.0 12/11/2017 1825   LYMPHSABS 3.2 12/11/2017 1825    MONOABS 0.8 12/11/2017 1825   EOSABS 0.4 12/11/2017 1825   BASOSABS 0.1 12/11/2017 1825   Iron/TIBC/Ferritin/ %Sat No results found for: IRON, TIBC, FERRITIN, IRONPCTSAT Lipid Panel     Component Value Date/Time   CHOL 148 07/02/2017 1708   TRIG 102.0 07/02/2017 1708   HDL 60.40 07/02/2017 1708   CHOLHDL 2 07/02/2017 1708   VLDL 20.4 07/02/2017 1708   LDLCALC 68 07/02/2017 1708   Hepatic Function Panel     Component Value Date/Time   PROT 6.9 04/03/2018 0838   ALBUMIN 4.7 04/03/2018 0838   AST 37 04/03/2018 0838   ALT 51 (H) 04/03/2018 9528  ALKPHOS 84 04/03/2018 0838   BILITOT 0.5 04/03/2018 0838   BILIDIR 0.0 02/05/2014 1426   IBILI 0.4 07/21/2013 1050      Component Value Date/Time   TSH 1.80 07/02/2017 1708   TSH 1.28 01/23/2017 1545   TSH 2.06 07/21/2016 1627    Ref. Range 04/03/2018 08:38  Vitamin D, 25-Hydroxy Latest Ref Range: 30.0 - 100.0 ng/mL 28.4 (L)   ASSESSMENT AND PLAN: Prediabetes  Vitamin D deficiency - Plan: Vitamin D, Ergocalciferol, (DRISDOL) 1.25 MG (50000 UT) CAPS capsule  At risk for diabetes mellitus  Class 1 obesity with serious comorbidity and body mass index (BMI) of 33.0 to 33.9 in adult, unspecified obesity type  PLAN:  Pre-Diabetes Azalya will continue to work on weight loss, exercise, increasing lean protein and decreasing simple carbohydrates in her diet to help decrease the risk of diabetes. She was informed that eating too many simple carbohydrates or too many calories at one sitting increases the likelihood of GI side effects. Maha agreed to follow up with [place] as directed to monitor her progress.  Diabetes risk counseling Odalys was given extended (15 minutes) diabetes prevention counseling today. She is 67 y.o. female and has risk factors for diabetes including obesity. We discussed intensive lifestyle modifications today with an emphasis on weight loss as well as increasing exercise and decreasing simple carbohydrates in  her diet.  Vitamin D Deficiency Tehani was informed that low vitamin D levels contributes to fatigue and are associated with obesity, breast, and colon cancer. She agrees to continue to take prescription Vit D @50 ,000 IU every week #4 with no refills (will pick up when needed) and will follow up for routine testing of vitamin D, at least 2-3 times per year. She was informed of the risk of over-replacement of vitamin D and agrees to not increase her dose unless she discusses this with [place] first. Armine agrees to follow up as directed.   Obesity Mallorey is currently in the action stage of change. As such, her goal is to continue with weight loss efforts She has agreed to follow the Category 1 plan Taneasha has been instructed to work up to a goal of 150 minutes of combined cardio and strengthening exercise per week for weight loss and overall health benefits. We discussed the following Behavioral Modification Strategies today: increase H2O intake, better snacking choices, increasing lean protein intake, decreasing simple carbohydrates , increasing vegetables, work on meal planning and easy cooking plans, holiday eating strategies and travel eating strategies Handout for "on the road" was provided to patient today.  Jennafer has agreed to follow up with our clinic in 4 weeks. She was informed of the importance of frequent follow up visits to maximize her success with intensive lifestyle modifications for her multiple health conditions.   OBESITY BEHAVIORAL INTERVENTION VISIT  Today's visit was # 3   Starting weight: 178 lbs Starting date: 04/03/2018 Today's weight : 175 lbs  Today's date: 05/07/2018 Total lbs lost to date: 3   ASK: We discussed the diagnosis of obesity with [name] today and Ren agreed to give [place] permission to discuss obesity behavioral modification therapy today.  ASSESS: Cresencia has the diagnosis of obesity and her BMI today is 33.08 Shatora is in  the action stage of change   ADVISE: Antoine was educated on the multiple health risks of obesity as well as the benefit of weight loss to improve her health. She was advised of the need for long term treatment and the importance of  lifestyle modifications to improve her current health and to decrease her risk of future health problems.  AGREE: Multiple dietary modification options and treatment options were discussed and  Belenda agreed to follow the recommendations documented in the above note.  ARRANGE: Kalisha was educated on the importance of frequent visits to treat obesity as outlined per CMS and USPSTF guidelines and agreed to schedule her next follow up appointment today.  Corey Skains, am acting as Location manager for [employer]. Owens Shark, DO  I have reviewed the above documentation for accuracy and completeness, and I agree with the above. -Jearld Lesch, DO

## 2018-05-14 ENCOUNTER — Other Ambulatory Visit: Payer: Self-pay | Admitting: Family Medicine

## 2018-05-27 ENCOUNTER — Ambulatory Visit (INDEPENDENT_AMBULATORY_CARE_PROVIDER_SITE_OTHER): Payer: Managed Care, Other (non HMO) | Admitting: Obstetrics & Gynecology

## 2018-05-27 ENCOUNTER — Encounter: Payer: Self-pay | Admitting: Obstetrics & Gynecology

## 2018-05-27 VITALS — BP 132/65 | HR 66 | Ht 61.0 in | Wt 178.0 lb

## 2018-05-27 DIAGNOSIS — Z124 Encounter for screening for malignant neoplasm of cervix: Secondary | ICD-10-CM | POA: Diagnosis not present

## 2018-05-27 DIAGNOSIS — Z01419 Encounter for gynecological examination (general) (routine) without abnormal findings: Secondary | ICD-10-CM | POA: Diagnosis not present

## 2018-05-27 DIAGNOSIS — Z1151 Encounter for screening for human papillomavirus (HPV): Secondary | ICD-10-CM

## 2018-05-27 NOTE — Progress Notes (Signed)
Subjective:     Margaret [name] is a 67 y.o. female here for a routine exam.  G2P2. Pt delivered by SVD. LMP was 10-12 years. Current complaints: none. No bleeding. Rare leakage of urine with laughing. This is not a problem.  Pt has had colonoscopy and BMD both were WNL.    Gynecologic History No LMP recorded. Patient is postmenopausal. Contraception: abstinence and post menopausal status Last Pap: 2011. Results were: normal Last mammogram: 05/04/2018. Results were: normal  Obstetric History OB History  Gravida Para Term Preterm AB Living  3 3 2     2   SAB TAB Ectopic Multiple Live Births          2    # Outcome Date GA Lbr Len/2nd Weight Sex Delivery Anes PTL Lv  3 Term 1985 [redacted]w[redacted]d   M Vag-Spont EPI  LIV  2 Term 16 [redacted]w[redacted]d   M Vag-Spont EPI  LIV  1 Para              The following portions of the patient's history were reviewed and updated as appropriate: allergies, current medications, past family history, past medical history, past social history, past surgical history and problem list.  Review of Systems Pertinent items are noted in HPI.    Objective:  BP 132/65   Pulse 66   Ht 5\' 1"  (1.549 m)   Wt 178 lb (80.7 kg)   BMI 33.63 kg/m  General Appearance:    Alert, cooperative, no distress, appears stated age  Head:    Normocephalic, without obvious abnormality, atraumatic  Eyes:    conjunctiva/corneas clear, EOM's intact, both eyes  Ears:    Normal external ear canals, both ears  Nose:   Nares normal, septum midline, mucosa normal, no drainage    or sinus tenderness  Throat:   Lips, mucosa, and tongue normal; teeth and gums normal  Neck:   Supple, symmetrical, trachea midline, no adenopathy;    thyroid:  no enlargement/tenderness/nodules  Back:     Symmetric, no curvature, ROM normal, no CVA tenderness  Lungs:     Clear to auscultation bilaterally, respirations unlabored  Chest Wall:    No tenderness or deformity   Heart:    Regular rate and rhythm, S1 and S2 normal, no  murmur, rub   or gallop  Breast Exam:    No tenderness, masses, or nipple abnormality; no skin changes noted  Abdomen:     Soft, non-tender, bowel sounds active all four quadrants,    no masses, no organomegaly  Genitalia:    Normal female without lesion, discharge or tenderness; no cervical lesion. Small uterus- mobile. No adnexal masses.      Extremities:   Extremities normal, atraumatic, no cyanosis or edema  Pulses:   2+ and symmetric all extremities  Skin:   Skin color, texture, turgor normal, no rashes or lesions      Assessment:    Healthy female exam.  Cervical cancer screening.  Breast cancer screening. Normal exam. UTD on mammogram.    Plan:    Follow up in: 1 year.    PAP with hrHPV testing.   Catia Todorov L. Harraway-Smith, M.D., Cherlynn June

## 2018-05-30 LAB — CYTOLOGY - PAP
Diagnosis: NEGATIVE
HPV: NOT DETECTED

## 2018-06-04 ENCOUNTER — Ambulatory Visit (INDEPENDENT_AMBULATORY_CARE_PROVIDER_SITE_OTHER): Payer: Managed Care, Other (non HMO) | Admitting: Bariatrics

## 2018-06-11 ENCOUNTER — Encounter (INDEPENDENT_AMBULATORY_CARE_PROVIDER_SITE_OTHER): Payer: Self-pay | Admitting: Bariatrics

## 2018-06-11 ENCOUNTER — Ambulatory Visit (INDEPENDENT_AMBULATORY_CARE_PROVIDER_SITE_OTHER): Payer: Managed Care, Other (non HMO) | Admitting: Bariatrics

## 2018-06-11 VITALS — BP 117/72 | HR 81 | Temp 98.6°F | Ht 61.0 in | Wt 175.0 lb

## 2018-06-11 DIAGNOSIS — E559 Vitamin D deficiency, unspecified: Secondary | ICD-10-CM | POA: Diagnosis not present

## 2018-06-11 DIAGNOSIS — R682 Dry mouth, unspecified: Secondary | ICD-10-CM | POA: Diagnosis not present

## 2018-06-11 DIAGNOSIS — R7303 Prediabetes: Secondary | ICD-10-CM

## 2018-06-11 DIAGNOSIS — G4733 Obstructive sleep apnea (adult) (pediatric): Secondary | ICD-10-CM | POA: Diagnosis not present

## 2018-06-11 DIAGNOSIS — Z6833 Body mass index (BMI) 33.0-33.9, adult: Secondary | ICD-10-CM

## 2018-06-11 DIAGNOSIS — Z9189 Other specified personal risk factors, not elsewhere classified: Secondary | ICD-10-CM | POA: Diagnosis not present

## 2018-06-11 DIAGNOSIS — E669 Obesity, unspecified: Secondary | ICD-10-CM

## 2018-06-12 NOTE — Progress Notes (Signed)
Office: [phone]  /  Fax: [phone]   HPI:   Chief Complaint: OBESITY Margaret [name] is here to discuss her progress with her obesity treatment plan. She is on the Category 1 plan and is following her eating plan approximately 40 % of the time. She states she is stretching for 10 to 15 minutes 3 times per week. Margaret [name] has been traveling during the holidays. She did try to be more aware. She has an increase of 2 pounds in water weight. Her weight is 175 lb (79.4 kg) today and she has maintained weight over a period of 5 weeks since her last visit. She has lost 3 lbs since starting treatment with [place].  Pre-Diabetes Margaret [name] has a diagnosis of prediabetes based on her elevated Hgb A1c and was informed this puts her at greater risk of developing diabetes. Her last A1c was at 6.1 and last insulin level was at 18.8 She is not taking medications currently and continues to work on diet and exercise to decrease risk of diabetes. She denies polyphagia.  At risk for diabetes Margaret [name] is at higher than average risk for developing diabetes due to her obesity and prediabetes. She currently denies polyuria or polydipsia.  Vitamin D deficiency Margaret [name] has a diagnosis of vitamin D deficiency. She is currently taking high dose vit D and denies nausea, vomiting or muscle weakness.  Dry Mouth Margaret [name] has dry mouth at night, while she is sleeping. There has been an increase with her medications.  OSA (obstructive sleep apnea) Margaret [name] has obstructive sleep apnea and she is feeling better with using CPAP.  ASSESSMENT AND PLAN:  Prediabetes  Vitamin D deficiency  Dry mouth  OSA (obstructive sleep apnea)  At risk for diabetes mellitus  Class 1 obesity with serious comorbidity and body mass index (BMI) of 33.0 to 33.9 in adult, unspecified obesity type  PLAN:  Pre-Diabetes [name] will continue to work on weight loss, exercise, increasing lean protein and decreasing simple carbohydrates in  her diet to help decrease the risk of diabetes. She was informed that eating too many simple carbohydrates or too many calories at one sitting increases the likelihood of GI side effects.  [name] agreed to follow up with [place] as directed to monitor her progress.  Diabetes risk counseling [name] was given extended (15 minutes) diabetes prevention counseling today. She is 68 y.o. female and has risk factors for diabetes including obesity and prediabetes. We discussed intensive lifestyle modifications today with an emphasis on weight loss as well as increasing exercise and decreasing simple carbohydrates in her diet.  Vitamin D Deficiency Margaret [name] was informed that low vitamin D levels contributes to fatigue and are associated with obesity, breast, and colon cancer. She agrees to continue to take prescription Vit D @50 ,000 IU every week and will follow up for routine testing of vitamin D, at least 2-3 times per year. She was informed of the risk of over-replacement of vitamin D and agrees to not increase her dose unless she discusses this with [place] first.  Dry Mouth Margaret [name] will use over the counter medications (Biotene) and she will keep a water bottle near her bed. Ivory agrees to follow up with our clinic in 2 weeks.  OSA (obstructive sleep apnea) Margaret [name] uses her CPAP most nights and she will continue to use CPAP. She will follow up with our clinic at the agreed upon time.  Obesity Margaret [name] is currently in the action stage of change. As such, her goal is to continue with weight loss efforts  She has agreed to follow the Category 1 plan Margaret [name] will start resistance bands and exercise band exercises. She will use light weights.  We discussed the following Behavioral Modification Strategies today: increase H2O intake, keeping healthy foods in the home, increasing lean protein intake, decreasing simple carbohydrates, increasing vegetables, decreasing sodium intake and work on meal planning and  easy cooking plans  Margaret [name] has agreed to follow up with our clinic in 2 weeks. She was informed of the importance of frequent follow up visits to maximize her success with intensive lifestyle modifications for her multiple health conditions.  ALLERGIES: No Known Allergies  MEDICATIONS: Current Outpatient Medications on File Prior to Visit  Medication Sig Dispense Refill  . aspirin 81 MG chewable tablet Chew by mouth daily.    . Calcium Carbonate Antacid (TUMS PO) Take by mouth as needed.    . fish oil-omega-3 fatty acids 1000 MG capsule Take 2 g by mouth daily.    . Multiple Vitamin (MULTIVITAMIN WITH MINERALS) TABS tablet Take 1 tablet by mouth daily.    Marland Kitchen omeprazole (PRILOSEC) 40 MG capsule Take 40 mg by mouth daily as needed.    . ranitidine (ZANTAC) 150 MG tablet TAKE 1 TABLET (150 MG TOTAL) BY MOUTH AT BEDTIME. 30 tablet 3  . rosuvastatin (CRESTOR) 10 MG tablet Take 1 tablet (10 mg total) by mouth daily. 30 tablet 6  . Vitamin D, Ergocalciferol, (DRISDOL) 1.25 MG (50000 UT) CAPS capsule Take 1 capsule (50,000 Units total) by mouth every 7 (seven) days. 4 capsule 0   No current facility-administered medications on file prior to visit.     PAST MEDICAL HISTORY: Past Medical History:  Diagnosis Date  . Allergic state 03/25/2012  . Allergy   . Aortic calcification ([place]) 08/27/2014  . Atherosclerosis of aorta ([place]) 04/12/2014  . Back pain   . Constipation 01/23/2017  . GERD (gastroesophageal reflux disease)   . Hiatal hernia with gastroesophageal reflux 04/27/2010   Qualifier: Diagnosis of  By: Danelle Earthly CMA, Darlene  Failed Omeprazole and Protonix   . Hx of adenomatous polyp of colon 01/23/2017   Colonoscopy 01/16/2017 with 2 polyps one was adenomatous. recommeded follow up in 5 years.performed by Dr Silverio Decamp  . Hyperlipidemia   . Joint pain   . Osteopenia 08/31/2012  . Peripheral edema 12/13/2014  . Sleep apnea   . Unspecified sinusitis (chronic) 03/30/2013    PAST SURGICAL  HISTORY: Past Surgical History:  Procedure Laterality Date  . [place] STUDY N/A 12/15/2015   Procedure: [place] STUDY;  Surgeon: Mauri Pole, MD;  Location: [facility];  Service: Endoscopy;  Laterality: N/A;  . DILATION AND CURETTAGE OF UTERUS    . ESOPHAGEAL MANOMETRY N/A 12/15/2015   Procedure: ESOPHAGEAL MANOMETRY (EM);  Surgeon: Mauri Pole, MD;  Location: [facility];  Service: Endoscopy;  Laterality: N/A;    SOCIAL HISTORY: Social History   Tobacco Use  . Smoking status: Never Smoker  . Smokeless tobacco: Never Used  Substance Use Topics  . Alcohol use: Yes    Alcohol/week: 0.0 standard drinks    Comment: Rarely  . Drug use: No    FAMILY HISTORY: Family History  Problem Relation Age of Onset  . Stroke Mother   . High blood pressure Mother   . High Cholesterol Mother   . Lung cancer Father   . Stroke Sister        30  . Thyroid disease Sister   . Diabetes Sister   .  Hypertension Sister   . Hyperlipidemia Sister   . Hypertension Sister   . Sleep apnea Son   . Obesity Son     ROS: Review of Systems  Constitutional: Negative for weight loss.  Gastrointestinal: Negative for nausea and vomiting.  Genitourinary: Negative for frequency.  Musculoskeletal:       Negative for muscle weakness  Endo/Heme/Allergies: Negative for polydipsia.       Negative for polyphagia    PHYSICAL EXAM: Blood pressure 117/72, pulse 81, temperature 98.6 F (37 C), temperature source Oral, height 5\' 1"  (1.549 m), weight 175 lb (79.4 kg), SpO2 96 %. Body mass index is 33.07 kg/m. Physical Exam Vitals signs reviewed.  Constitutional:      Appearance: Normal appearance. She is well-developed. She is obese.  Cardiovascular:     Rate and Rhythm: Normal rate.  Pulmonary:     Effort: Pulmonary effort is normal.  Musculoskeletal: Normal range of motion.  Skin:    General: Skin is warm and dry.  Neurological:     Mental Status: She is alert and oriented to  person, place, and time.  Psychiatric:        Mood and Affect: Mood normal.        Behavior: Behavior normal.     RECENT LABS AND TESTS: BMET    Component Value Date/Time   NA 142 04/03/2018 0838   K 4.2 04/03/2018 0838   CL 101 04/03/2018 0838   CO2 25 04/03/2018 0838   GLUCOSE 97 04/03/2018 0838   GLUCOSE 83 12/11/2017 1825   BUN 13 04/03/2018 0838   CREATININE 0.68 04/03/2018 0838   CREATININE 0.78 07/21/2016 1627   CALCIUM 9.9 04/03/2018 0838   GFRNONAA 93 04/03/2018 0838   GFRAA 107 04/03/2018 0838   Lab Results  Component Value Date   HGBA1C 6.1 (H) 04/03/2018   HGBA1C 6.2 12/11/2017   HGBA1C 5.9 07/02/2017   HGBA1C 6.1 01/23/2017   HGBA1C 6.0 (H) 09/06/2016   Lab Results  Component Value Date   INSULIN 18.8 04/03/2018   CBC    Component Value Date/Time   WBC 7.0 12/11/2017 1825   RBC 4.60 12/11/2017 1825   HGB 13.3 12/11/2017 1825   HCT 40.4 12/11/2017 1825   PLT 237.0 12/11/2017 1825   MCV 87.9 12/11/2017 1825   MCH 29.0 11/27/2017 1515   MCHC 32.9 12/11/2017 1825   RDW 14.0 12/11/2017 1825   LYMPHSABS 3.2 12/11/2017 1825   MONOABS 0.8 12/11/2017 1825   EOSABS 0.4 12/11/2017 1825   BASOSABS 0.1 12/11/2017 1825   Iron/TIBC/Ferritin/ %Sat No results found for: IRON, TIBC, FERRITIN, IRONPCTSAT Lipid Panel     Component Value Date/Time   CHOL 148 07/02/2017 1708   TRIG 102.0 07/02/2017 1708   HDL 60.40 07/02/2017 1708   CHOLHDL 2 07/02/2017 1708   VLDL 20.4 07/02/2017 1708   LDLCALC 68 07/02/2017 1708   Hepatic Function Panel     Component Value Date/Time   PROT 6.9 04/03/2018 0838   ALBUMIN 4.7 04/03/2018 0838   AST 37 04/03/2018 0838   ALT 51 (H) 04/03/2018 0838   ALKPHOS 84 04/03/2018 0838   BILITOT 0.5 04/03/2018 0838   BILIDIR 0.0 02/05/2014 1426   IBILI 0.4 07/21/2013 1050      Component Value Date/Time   TSH 1.80 07/02/2017 1708   TSH 1.28 01/23/2017 1545   TSH 2.06 07/21/2016 1627     Ref. Range 04/03/2018 08:38  Vitamin  D, 25-Hydroxy Latest Ref Range: 30.0 - 100.0  ng/mL 28.4 (L)     OBESITY BEHAVIORAL INTERVENTION VISIT  Today's visit was # 4   Starting weight: 178 lbs Starting date: 04/03/2018 Today's weight : 175 lbs Today's date: 06/11/2018 Total lbs lost to date: 3   ASK: We discussed the diagnosis of obesity with [name] today and Asmi agreed to give [place] permission to discuss obesity behavioral modification therapy today.  ASSESS: Briaunna has the diagnosis of obesity and her BMI today is 33.08 Sinthia is in the action stage of change   ADVISE: Patsie was educated on the multiple health risks of obesity as well as the benefit of weight loss to improve her health. She was advised of the need for long term treatment and the importance of lifestyle modifications to improve her current health and to decrease her risk of future health problems.  AGREE: Multiple dietary modification options and treatment options were discussed and  Terris agreed to follow the recommendations documented in the above note.  ARRANGE: Johnetta was educated on the importance of frequent visits to treat obesity as outlined per CMS and USPSTF guidelines and agreed to schedule her next follow up appointment today.  Corey Skains, am acting as Location manager for [employer]. Owens Shark, DO  I have reviewed the above documentation for accuracy and completeness, and I agree with the above. -Jearld Lesch, DO

## 2018-06-22 ENCOUNTER — Other Ambulatory Visit (INDEPENDENT_AMBULATORY_CARE_PROVIDER_SITE_OTHER): Payer: Self-pay | Admitting: Bariatrics

## 2018-06-22 DIAGNOSIS — E559 Vitamin D deficiency, unspecified: Secondary | ICD-10-CM

## 2018-06-25 ENCOUNTER — Ambulatory Visit (INDEPENDENT_AMBULATORY_CARE_PROVIDER_SITE_OTHER): Payer: Managed Care, Other (non HMO) | Admitting: Bariatrics

## 2018-07-08 ENCOUNTER — Encounter (INDEPENDENT_AMBULATORY_CARE_PROVIDER_SITE_OTHER): Payer: Self-pay

## 2018-07-08 ENCOUNTER — Ambulatory Visit (INDEPENDENT_AMBULATORY_CARE_PROVIDER_SITE_OTHER): Payer: Managed Care, Other (non HMO) | Admitting: Bariatrics

## 2018-07-16 ENCOUNTER — Encounter (INDEPENDENT_AMBULATORY_CARE_PROVIDER_SITE_OTHER): Payer: Self-pay

## 2018-07-16 ENCOUNTER — Ambulatory Visit (INDEPENDENT_AMBULATORY_CARE_PROVIDER_SITE_OTHER): Payer: Managed Care, Other (non HMO) | Admitting: Bariatrics

## 2018-07-19 ENCOUNTER — Other Ambulatory Visit: Payer: Self-pay | Admitting: Gastroenterology

## 2018-09-05 ENCOUNTER — Encounter: Payer: Self-pay | Admitting: Family Medicine

## 2018-09-10 ENCOUNTER — Ambulatory Visit (INDEPENDENT_AMBULATORY_CARE_PROVIDER_SITE_OTHER): Payer: Managed Care, Other (non HMO) | Admitting: Family Medicine

## 2018-09-10 ENCOUNTER — Other Ambulatory Visit: Payer: Self-pay

## 2018-09-10 DIAGNOSIS — E669 Obesity, unspecified: Secondary | ICD-10-CM | POA: Diagnosis not present

## 2018-09-10 DIAGNOSIS — R739 Hyperglycemia, unspecified: Secondary | ICD-10-CM | POA: Diagnosis not present

## 2018-09-10 DIAGNOSIS — I10 Essential (primary) hypertension: Secondary | ICD-10-CM | POA: Diagnosis not present

## 2018-09-10 NOTE — Telephone Encounter (Signed)
Patient scheduled w/ pcp 09/10/18

## 2018-09-10 NOTE — Assessment & Plan Note (Addendum)
Patient noted her blood pressure starting to trend up in late March. Frequent numbers in the 140s and occasional 557D systolic. The diastolic running 79 to 92. she checks it during the visit and it is 160/97 on recheck 142/76. Started on Metoprolol XL 25 mg daily

## 2018-09-11 MED ORDER — METOPROLOL SUCCINATE ER 25 MG PO TB24: 25.0000 mg | ORAL_TABLET | Freq: Every day | ORAL | 1 refills | Status: DC

## 2018-09-11 NOTE — Assessment & Plan Note (Signed)
Was following with healthy weight and wellness and is now trying to quarantine so is not going. Encouraged DASH diet, decrease po intake and increase exercise as tolerated. Needs 7-8 hours of sleep nightly. Avoid trans fats, eat small, frequent meals every 4-5 hours with lean proteins, complex carbs and healthy fats. Minimize simple carbs

## 2018-09-11 NOTE — Progress Notes (Signed)
Virtual Visit via Video Note  I connected with Margaret [name] on 09/11/18 at 12:00 PM EDT by a video enabled telemedicine application and verified that I am speaking with the correct person using two identifiers.   I discussed the limitations of evaluation and management by telemedicine and the availability of in person appointments. The patient expressed understanding and agreed to proceed. Margaret [name], CMA wa sable to get patient set up on video visit platform    Subjective:    Patient ID: Margaret [name], female    DOB: 04/23/1951, 68 y.o.   MRN: [id number]  No chief complaint on file.   HPI Patient is in today for evaluation of elevated blood pressure. She notes her blood pressure has been trending up over the past week or two. Systolic numbers generally in the 140s but have gone up into 150s. She has had some headaches and fatigue as a result. She admits she has been less active and not eating as well as she had been. No recent febrile illness or hospitalizations. Does not endorse polyuria or polydipsia. Denies CP/palp/SOB/congestion/fevers/GI or GU c/o. Taking meds as prescribed  Past Medical History:  Diagnosis Date  . Allergic state 03/25/2012  . Allergy   . Aortic calcification ([place]) 08/27/2014  . Atherosclerosis of aorta ([place]) 04/12/2014  . Back pain   . Constipation 01/23/2017  . GERD (gastroesophageal reflux disease)   . Hiatal hernia with gastroesophageal reflux 04/27/2010   Qualifier: Diagnosis of  By: Danelle Earthly CMA, Darlene  Failed Omeprazole and Protonix   . Hx of adenomatous polyp of colon 01/23/2017   Colonoscopy 01/16/2017 with 2 polyps one was adenomatous. recommeded follow up in 5 years.performed by Dr Silverio Decamp  . Hyperlipidemia   . Joint pain   . Osteopenia 08/31/2012  . Peripheral edema 12/13/2014  . Sleep apnea   . Unspecified sinusitis (chronic) 03/30/2013    Past Surgical History:  Procedure Laterality Date  . [place] STUDY N/A 12/15/2015   Procedure: [place] STUDY;  Surgeon: Mauri Pole, MD;  Location: [facility];  Service: Endoscopy;  Laterality: N/A;  . DILATION AND CURETTAGE OF UTERUS    . ESOPHAGEAL MANOMETRY N/A 12/15/2015   Procedure: ESOPHAGEAL MANOMETRY (EM);  Surgeon: Mauri Pole, MD;  Location: [facility];  Service: Endoscopy;  Laterality: N/A;    Family History  Problem Relation Age of Onset  . Stroke Mother   . High blood pressure Mother   . High Cholesterol Mother   . Lung cancer Father   . Stroke Sister        58  . Thyroid disease Sister   . Diabetes Sister   . Hypertension Sister   . Hyperlipidemia Sister   . Hypertension Sister   . Sleep apnea Son   . Obesity Son     Social History   Socioeconomic History  . Marital status: Married    Spouse name: [name]  . Number of children: 2  . Years of education: 27  . Highest education level: Not on file  Occupational History  . 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 Financial resource strain: Not on file  . Food insecurity:    Worry: Not on file    Inability: Not on file  . Transportation needs:    Medical: Not on file    Non-medical: Not on file  Tobacco Use  . Smoking status: Never Smoker  . Smokeless tobacco: Never Used  Substance and Sexual  Activity  . Alcohol use: Yes    Alcohol/week: 0.0 standard drinks    Comment: Rarely  . Drug use: No  . Sexual activity: Not Currently    Comment: no dietary restrictions, lives with husand, works at [employer]  . Physical activity:    Days per week: Not on file    Minutes per session: Not on file  . Stress: Not on file  Relationships  . Social connections:    Talks on phone: Not on file    Gets together: Not on file    Attends religious service: Not on file    Active member of club or organization: Not on file    Attends meetings of clubs or organizations: Not on file    Relationship status: Not on file  . Intimate partner violence:    Fear of current or ex  partner: Not on file    Emotionally abused: Not on file    Physically abused: Not on file    Forced sexual activity: Not on file  Other Topics Concern  . Not on file  Social History Narrative   Lives with husband   Caffeine- tea, 2 cups daily    Outpatient Medications Prior to Visit  Medication Sig Dispense Refill  . aspirin 81 MG chewable tablet Chew by mouth daily.    . Calcium Carbonate Antacid (TUMS PO) Take by mouth as needed.    . fish oil-omega-3 fatty acids 1000 MG capsule Take 2 g by mouth daily.    . Multiple Vitamin (MULTIVITAMIN WITH MINERALS) TABS tablet Take 1 tablet by mouth daily.    Marland Kitchen omeprazole (PRILOSEC) 40 MG capsule TAKE 1 CAPSULE (40 MG TOTAL) BY MOUTH 2 (TWO) TIMES DAILY BEFORE A MEAL. 60 capsule 11  . ranitidine (ZANTAC) 150 MG tablet TAKE 1 TABLET (150 MG TOTAL) BY MOUTH AT BEDTIME. 30 tablet 3  . rosuvastatin (CRESTOR) 10 MG tablet Take 1 tablet (10 mg total) by mouth daily. 30 tablet 6  . Vitamin D, Ergocalciferol, (DRISDOL) 1.25 MG (50000 UT) CAPS capsule TAKE 1 CAPSULE (50,000 UNITS TOTAL) BY MOUTH EVERY 7 (SEVEN) DAYS. 2 capsule 0   No facility-administered medications prior to visit.     No Known Allergies  Review of Systems  Constitutional: Positive for malaise/fatigue. Negative for fever.  HENT: Negative for congestion.   Eyes: Negative for blurred vision.  Respiratory: Negative for shortness of breath.   Cardiovascular: Negative for chest pain, palpitations and leg swelling.  Gastrointestinal: Negative for abdominal pain, blood in stool and nausea.  Genitourinary: Negative for dysuria and frequency.  Musculoskeletal: Negative for falls.  Skin: Negative for rash.  Neurological: Positive for headaches. Negative for dizziness and loss of consciousness.  Endo/Heme/Allergies: Negative for environmental allergies.  Psychiatric/Behavioral: Negative for depression. The patient is not nervous/anxious.        Objective:    Physical Exam  Constitutional:      Appearance: Normal appearance.  HENT:     Head: Normocephalic and atraumatic.     Nose: Nose normal.  Pulmonary:     Effort: Pulmonary effort is normal.  Skin:    General: Skin is dry.  Neurological:     Mental Status: She is alert and oriented to person, place, and time.  Psychiatric:        Mood and Affect: Mood normal.        Behavior: Behavior normal.     There were no vitals taken for this visit. Wt Readings from Last  3 Encounters:  06/11/18 175 lb (79.4 kg)  05/27/18 178 lb (80.7 kg)  05/07/18 175 lb (79.4 kg)    Diabetic Foot Exam - Simple   No data filed     Lab Results  Component Value Date   WBC 7.0 12/11/2017   HGB 13.3 12/11/2017   HCT 40.4 12/11/2017   PLT 237.0 12/11/2017   GLUCOSE 97 04/03/2018   CHOL 148 07/02/2017   TRIG 102.0 07/02/2017   HDL 60.40 07/02/2017   LDLCALC 68 07/02/2017   ALT 51 (H) 04/03/2018   AST 37 04/03/2018   NA 142 04/03/2018   K 4.2 04/03/2018   CL 101 04/03/2018   CREATININE 0.68 04/03/2018   BUN 13 04/03/2018   CO2 25 04/03/2018   TSH 1.80 07/02/2017   HGBA1C 6.1 (H) 04/03/2018   MICROALBUR 3.06 (H) 05/16/2011    Lab Results  Component Value Date   TSH 1.80 07/02/2017   Lab Results  Component Value Date   WBC 7.0 12/11/2017   HGB 13.3 12/11/2017   HCT 40.4 12/11/2017   MCV 87.9 12/11/2017   PLT 237.0 12/11/2017   Lab Results  Component Value Date   NA 142 04/03/2018   K 4.2 04/03/2018   CO2 25 04/03/2018   GLUCOSE 97 04/03/2018   BUN 13 04/03/2018   CREATININE 0.68 04/03/2018   BILITOT 0.5 04/03/2018   ALKPHOS 84 04/03/2018   AST 37 04/03/2018   ALT 51 (H) 04/03/2018   PROT 6.9 04/03/2018   ALBUMIN 4.7 04/03/2018   CALCIUM 9.9 04/03/2018   ANIONGAP 6 11/27/2017   GFR 81.28 12/11/2017   Lab Results  Component Value Date   CHOL 148 07/02/2017   Lab Results  Component Value Date   HDL 60.40 07/02/2017   Lab Results  Component Value Date   LDLCALC 68 07/02/2017    Lab Results  Component Value Date   TRIG 102.0 07/02/2017   Lab Results  Component Value Date   CHOLHDL 2 07/02/2017   Lab Results  Component Value Date   HGBA1C 6.1 (H) 04/03/2018       Assessment & Plan:   Problem List Items Addressed This Visit    Hyperglycemia    minimize simple carbs. Increase exercise as tolerated.        Essential hypertension    Patient noted her blood pressure starting to trend up in late March. Frequent numbers in the 140s and occasional 379K systolic. The diastolic running 79 to 92. she checks it during the visit and it is 160/97 on recheck 142/76. Started on Metoprolol XL 25 mg daily      Relevant Medications   metoprolol succinate (TOPROL-XL) 25 MG 24 hr tablet   Obesity    Was following with healthy weight and wellness and is now trying to quarantine so is not going. Encouraged DASH diet, decrease po intake and increase exercise as tolerated. Needs 7-8 hours of sleep nightly. Avoid trans fats, eat small, frequent meals every 4-5 hours with lean proteins, complex carbs and healthy fats. Minimize simple carbs         I am having Trany Strieter start on metoprolol succinate. I am also having her maintain her fish oil-omega-3 fatty acids, multivitamin with minerals, Calcium Carbonate Antacid (TUMS PO), aspirin, rosuvastatin, ranitidine, Vitamin D (Ergocalciferol), and omeprazole.  Meds ordered this encounter  Medications  . metoprolol succinate (TOPROL-XL) 25 MG 24 hr tablet    Sig: Take 1 tablet (25 mg total) by mouth at bedtime.  Dispense:  30 tablet    Refill:  1     I discussed the assessment and treatment plan with the patient. The patient was provided an opportunity to ask questions and all were answered. The patient agreed with the plan and demonstrated an understanding of the instructions.   The patient was advised to call back or seek an in-person evaluation if the symptoms worsen or if the condition fails to improve as  anticipated.  I provided non-face-to-face visit during this encounter.   Penni Homans, MD

## 2018-09-11 NOTE — Assessment & Plan Note (Signed)
minimize simple carbs. Increase exercise as tolerated.  

## 2018-09-12 ENCOUNTER — Telehealth: Payer: Self-pay | Admitting: Family Medicine

## 2018-09-12 NOTE — Telephone Encounter (Signed)
LVM for patient to call back to schedule virtual office visit for 09/20/18.

## 2018-09-20 ENCOUNTER — Encounter: Payer: Self-pay | Admitting: Family Medicine

## 2018-09-20 ENCOUNTER — Other Ambulatory Visit: Payer: Self-pay

## 2018-09-20 ENCOUNTER — Ambulatory Visit (INDEPENDENT_AMBULATORY_CARE_PROVIDER_SITE_OTHER): Payer: Managed Care, Other (non HMO) | Admitting: Family Medicine

## 2018-09-20 DIAGNOSIS — I10 Essential (primary) hypertension: Secondary | ICD-10-CM | POA: Diagnosis not present

## 2018-09-20 DIAGNOSIS — Z7189 Other specified counseling: Secondary | ICD-10-CM | POA: Diagnosis not present

## 2018-09-20 DIAGNOSIS — R739 Hyperglycemia, unspecified: Secondary | ICD-10-CM | POA: Diagnosis not present

## 2018-09-20 NOTE — Assessment & Plan Note (Addendum)
Is doing a good job of staying at home, using masks, washing hands etc. She is encouraged to get adequate rest, hydration and nutrition as well as exercise. Vitamin C 1000 mg tid, Zinc, sodium bicarb 1/4 tsp tid

## 2018-09-22 IMAGING — MR MR HEAD W/O CM
9 of 12 series · 32 of 48 positions shown · non-contrast
Comparison: Head CT from earlier today

CLINICAL DATA: Right hand weakness and numbness

EXAM:
MRI HEAD WITHOUT CONTRAST
MRA HEAD WITHOUT CONTRAST
TECHNIQUE: Multiplanar, multiecho pulse sequences of the brain and surrounding
structures were obtained without intravenous contrast. Angiographic
images of the head were obtained using MRA technique without
contrast.

[Series 3: T1 · sagittal · 5.0mm · 0.47mm/px · 1 of 23 slices shown]
[im 1/23]
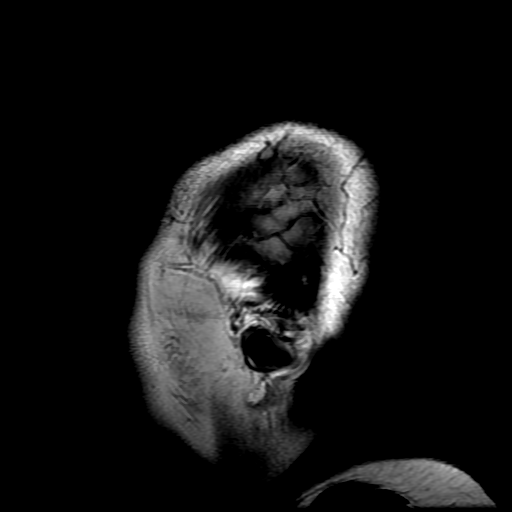

[Series 4: DWI · axial · 3.0mm · 1.09mm/px · z∈[-36,+108]mm · 7 of 98 slices shown (1 of 4)]
[im 1/98]
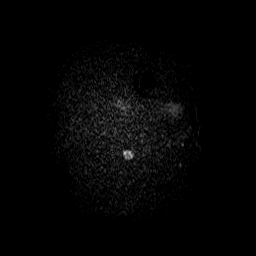
[im 17/98]
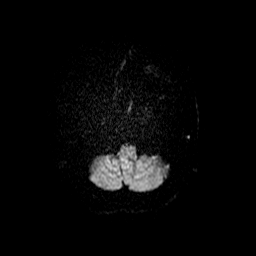
[im 33/98]
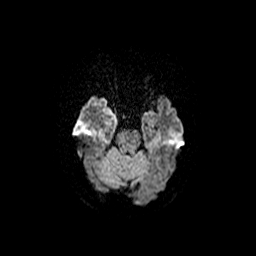
[im 49/98]
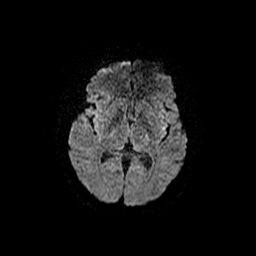
[im 65/98]
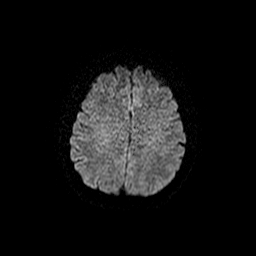
[im 81/98]
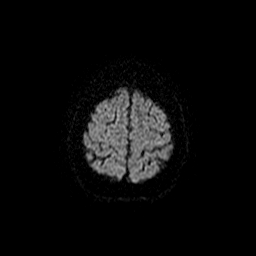
[im 98/98]
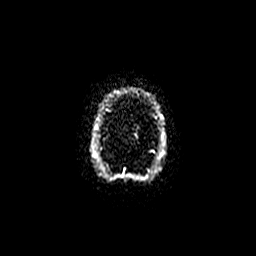

[Series 5: (id) mt fs · axial · 1.4mm · 0.43mm/px · z∈[-41,+24]mm · 6 of 136 slices shown]
[im 1/136]
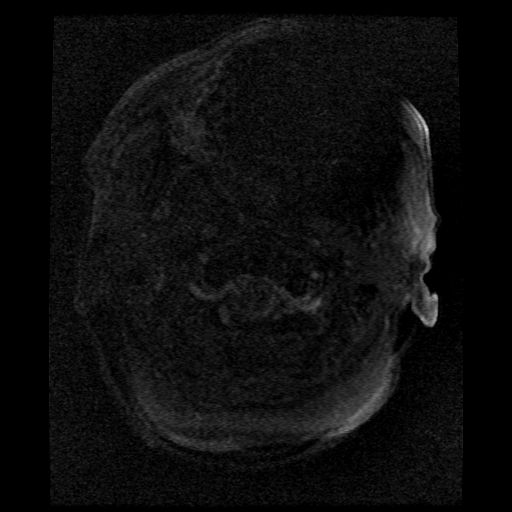
[im 28/136]
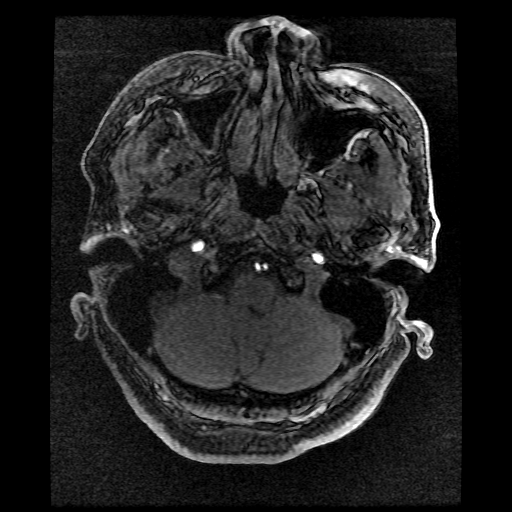
[im 41/136]
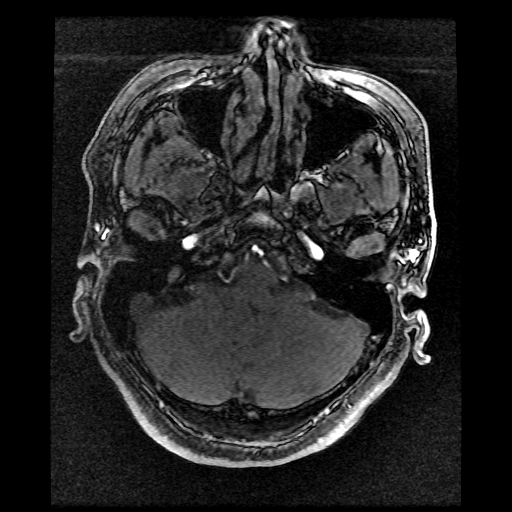
[im 55/136]
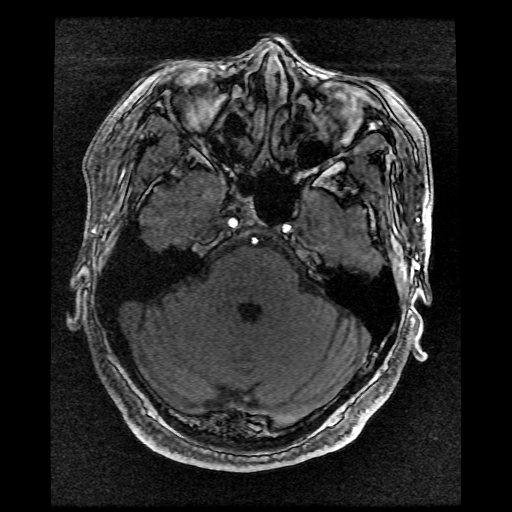
[im 82/136]
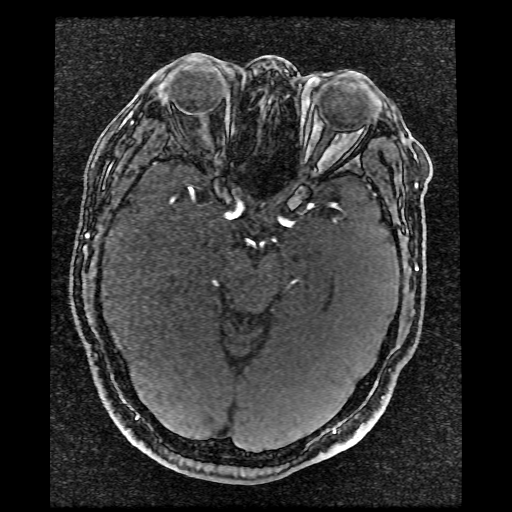
[im 95/136]
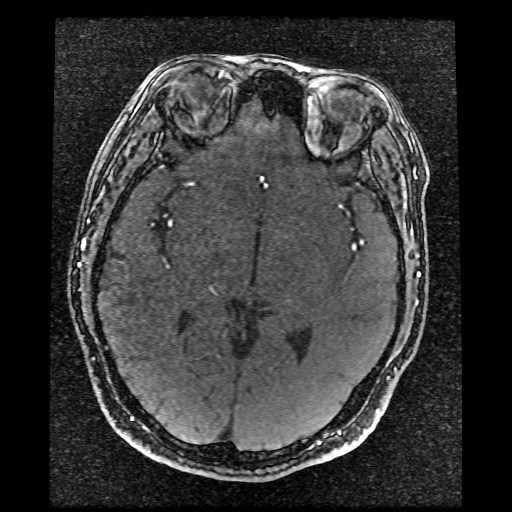

[Series 6: T2 · axial · 5.0mm · 0.43mm/px · z∈[-46,+103]mm · 2 of 26 slices shown (1 of 2)]
[im 1/26]
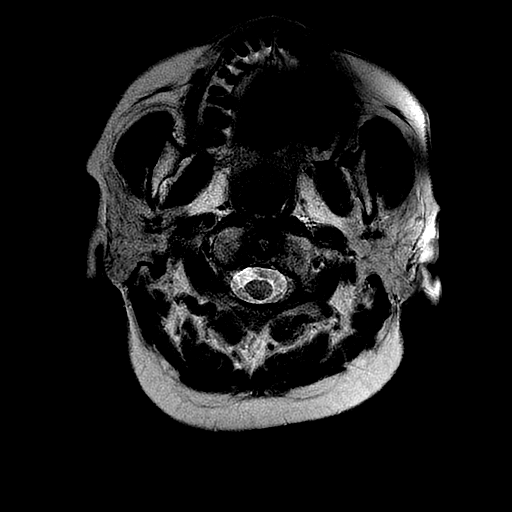
[im 26/26]
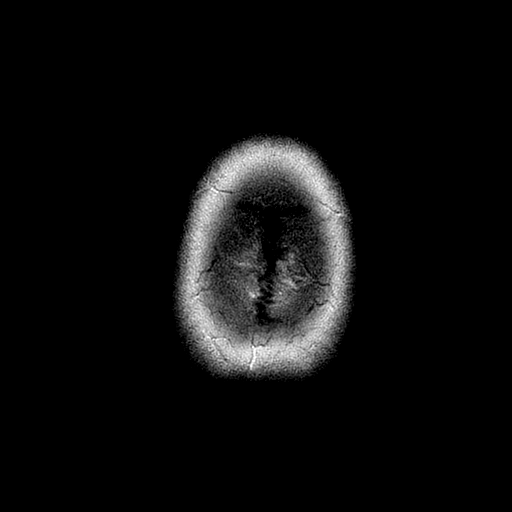

[Series 7: FLAIR · axial · 5.0mm · 0.43mm/px · z∈[-46,+103]mm · 2 of 26 slices shown]
[im 1/26]
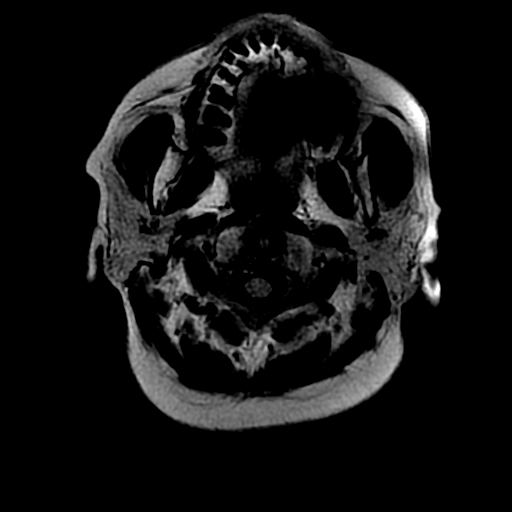
[im 26/26]
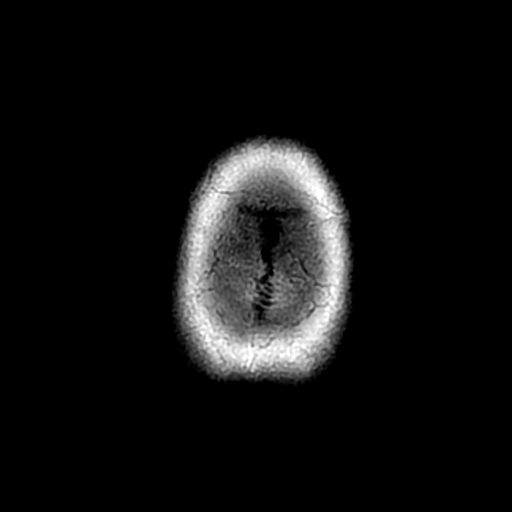

[Series 8: T2 · coronal · 5.0mm · 0.43mm/px · 2 of 27 slices shown (2 of 2)]
[im 1/27]
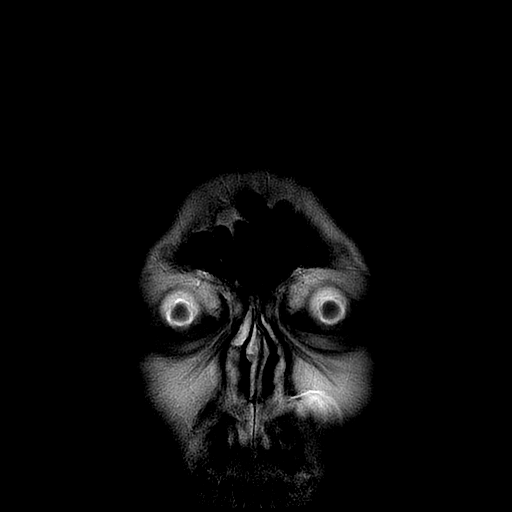
[im 27/27]
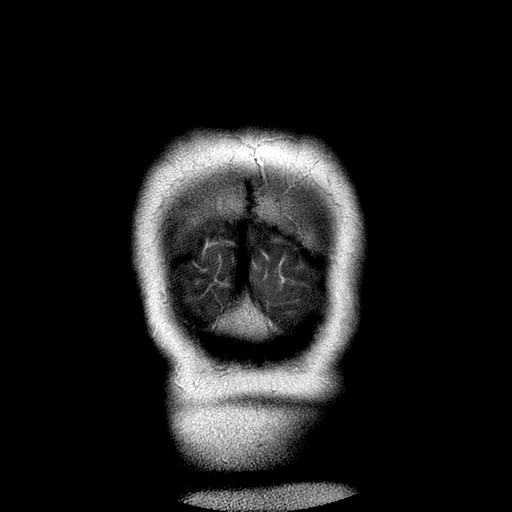

[Series 9: DWI · coronal · 5.0mm · 1.09mm/px · 5 of 66 slices shown (2 of 4)]
[im 1/66]
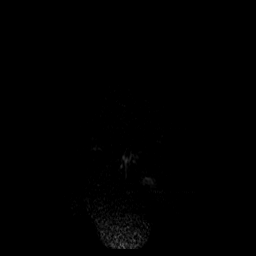
[im 17/66]
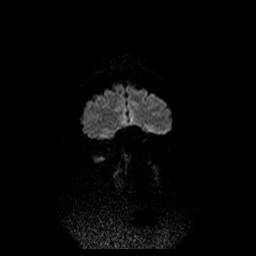
[im 33/66]
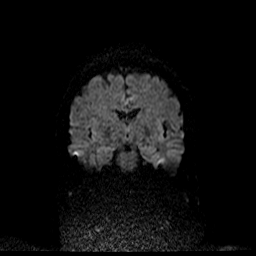
[im 49/66]
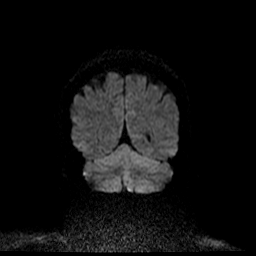
[im 66/66]
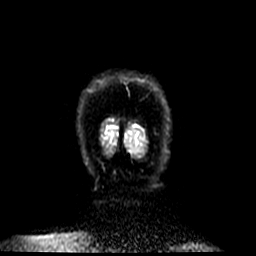

[Series 400: DWI · axial · 3.0mm · 1.09mm/px · z∈[-36,+108]mm · 4 of 49 slices shown (3 of 4)]
[im 1/49]
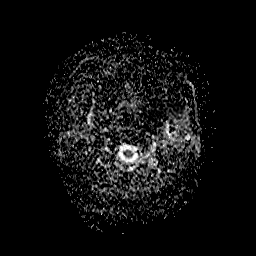
[im 17/49]
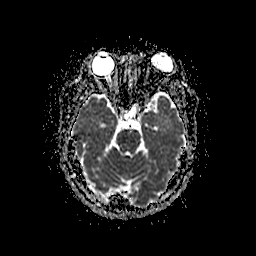
[im 33/49]
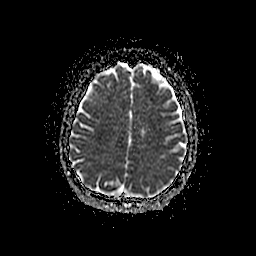
[im 49/49]
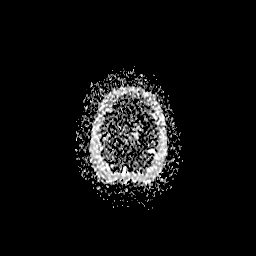

[Series 900: DWI · coronal · 5.0mm · 1.09mm/px · 3 of 33 slices shown (4 of 4)]
[im 1/33]
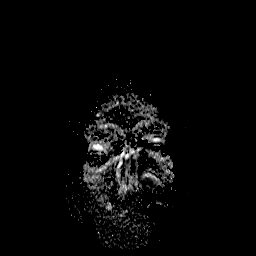
[im 17/33]
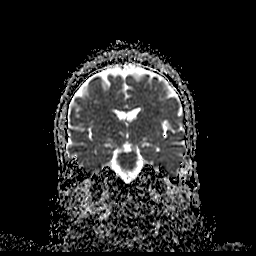
[im 33/33]
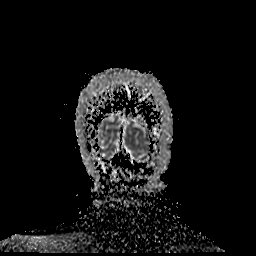

[32 of 48 positions shown; findings below may reference images not displayed]

FINDINGS: MRI HEAD FINDINGS

Brain: No acute infarction, hemorrhage, hydrocephalus, extra-axial
collection or mass lesion.

Single subcentimeter FLAIR hyperintensity in the left frontal white
matter, not directly in line with the motor strip to explain
right-sided symptoms. No generalized white matter disease to suggest
demyelinating process or generalized vasculopathy. In isolation,
findings such as this usually attributed to nonspecific
microvascular injury.

Partially empty sella appearance, considered incidental in this
setting.

Vascular: Normal flow voids.  Arterial findings below.

Skull and upper cervical spine: Negative

Sinuses/Orbits: Negative

MRA HEAD FINDINGS

Symmetric carotid arteries. The carotid siphons have luminal
undulation suggesting atherosclerosis. Symmetric vertebral arteries.
Standard vertebrobasilar branching. No flow limiting stenosis is
suspected. No major branch occlusion.

Downward and medially directed bulge from the right clinoid segment
ICA, 1-2 mm in diameter
IMPRESSION: 1. Negative brain MRI.  No acute finding, including infarct.
2. Atherosclerotic change along the carotid siphons. No flow
limiting stenosis or acute arterial finding.
3. 1-2 mm right superior hypophyseal aneurysm or infundibulum.

## 2018-09-22 MED ORDER — METOPROLOL SUCCINATE ER 25 MG PO TB24: 25.0000 mg | ORAL_TABLET | Freq: Two times a day (BID) | ORAL | 1 refills | Status: DC

## 2018-09-22 NOTE — Assessment & Plan Note (Signed)
minimize simple carbs. Increase exercise as tolerated.  

## 2018-09-22 NOTE — Progress Notes (Signed)
Virtual Visit via Video Note  I connected with Margaret [name] on 09/22/18 at 10:40 AM EDT by a video enabled telemedicine application and verified that I am speaking with the correct person using two identifiers.   I discussed the limitations of evaluation and management by telemedicine and the availability of in person appointments. The patient expressed understanding and agreed to proceed. Margaret [name], CMA was able to get the patient on the video visit.     Subjective:    Patient ID: Margaret [name], female    DOB: 08/06/1950, 68 y.o.   MRN: [id number]  Chief Complaint  Patient presents with  . Hypertension    Follow up. Started on Metoprolol 25mg  last visit. Had light dizziness when first started medication. BP still has some elevation.  . vitamin D deficiency    Pt was seeing DR Owens Shark for this and completed once weekly RX. Wants to know if PCP will refill.  . Immunizations    Pt due for pneumococcal 23.    HPI Patient is in today for follow up on hypertension and chronic medical concerns. She feels well today but is noting some anxiety regarding the current state and COVID. No recent febrile illness or hospitalizations. No polyuria or polydipsia endorsed. Denies CP/palp/SOB/HA/congestion/fevers/GI or GU c/o. Taking meds as prescribed  Past Medical History:  Diagnosis Date  . Allergic state 03/25/2012  . Allergy   . Aortic calcification ([place]) 08/27/2014  . Atherosclerosis of aorta ([place]) 04/12/2014  . Back pain   . Constipation 01/23/2017  . GERD (gastroesophageal reflux disease)   . Hiatal hernia with gastroesophageal reflux 04/27/2010   Qualifier: Diagnosis of  By: Danelle Earthly CMA, Darlene  Failed Omeprazole and Protonix   . Hx of adenomatous polyp of colon 01/23/2017   Colonoscopy 01/16/2017 with 2 polyps one was adenomatous. recommeded follow up in 5 years.performed by Dr Silverio Decamp  . Hyperlipidemia   . Joint pain   . Osteopenia 08/31/2012  . Peripheral edema 12/13/2014  .  Sleep apnea   . Unspecified sinusitis (chronic) 03/30/2013    Past Surgical History:  Procedure Laterality Date  . [place] STUDY N/A 12/15/2015   Procedure: [place] STUDY;  Surgeon: Mauri Pole, MD;  Location: [facility];  Service: Endoscopy;  Laterality: N/A;  . DILATION AND CURETTAGE OF UTERUS    . ESOPHAGEAL MANOMETRY N/A 12/15/2015   Procedure: ESOPHAGEAL MANOMETRY (EM);  Surgeon: Mauri Pole, MD;  Location: [facility];  Service: Endoscopy;  Laterality: N/A;    Family History  Problem Relation Age of Onset  . Stroke Mother   . High blood pressure Mother   . High Cholesterol Mother   . Lung cancer Father   . Stroke Sister        54  . Thyroid disease Sister   . Diabetes Sister   . Hypertension Sister   . Hyperlipidemia Sister   . Hypertension Sister   . Sleep apnea Son   . Obesity Son     Social History   Socioeconomic History  . Marital status: Married    Spouse name: [name]  . Number of children: 2  . Years of education: 68  . Highest education level: Not on file  Occupational History  . 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 Financial resource strain: Not on file  . Food insecurity:    Worry: Not on file    Inability: Not on file  . Transportation needs:  Medical: Not on file    Non-medical: Not on file  Tobacco Use  . Smoking status: Never Smoker  . Smokeless tobacco: Never Used  Substance and Sexual Activity  . Alcohol use: Yes    Alcohol/week: 0.0 standard drinks    Comment: Rarely  . Drug use: No  . Sexual activity: Not Currently    Comment: no dietary restrictions, lives with husand, works at [employer]  . Physical activity:    Days per week: Not on file    Minutes per session: Not on file  . Stress: Not on file  Relationships  . Social connections:    Talks on phone: Not on file    Gets together: Not on file    Attends religious service: Not on file    Active member of club or organization:  Not on file    Attends meetings of clubs or organizations: Not on file    Relationship status: Not on file  . Intimate partner violence:    Fear of current or ex partner: Not on file    Emotionally abused: Not on file    Physically abused: Not on file    Forced sexual activity: Not on file  Other Topics Concern  . Not on file  Social History Narrative   Lives with husband   Caffeine- tea, 2 cups daily    Outpatient Medications Prior to Visit  Medication Sig Dispense Refill  . aspirin 81 MG chewable tablet Chew by mouth daily.    . fish oil-omega-3 fatty acids 1000 MG capsule Take 2 g by mouth daily.    . Multiple Vitamin (MULTIVITAMIN WITH MINERALS) TABS tablet Take 1 tablet by mouth daily.    Marland Kitchen omeprazole (PRILOSEC) 40 MG capsule TAKE 1 CAPSULE (40 MG TOTAL) BY MOUTH 2 (TWO) TIMES DAILY BEFORE A MEAL. 60 capsule 11  . rosuvastatin (CRESTOR) 10 MG tablet Take 1 tablet (10 mg total) by mouth daily. 30 tablet 6  . VITAMIN D PO Take 2,500 Units by mouth daily.    . Calcium Carbonate Antacid (TUMS PO) Take by mouth as needed.    . metoprolol succinate (TOPROL-XL) 25 MG 24 hr tablet Take 1 tablet (25 mg total) by mouth at bedtime. 30 tablet 1  . ranitidine (ZANTAC) 150 MG tablet TAKE 1 TABLET (150 MG TOTAL) BY MOUTH AT BEDTIME. (Patient not taking: Reported on 09/20/2018) 30 tablet 3  . Vitamin D, Ergocalciferol, (DRISDOL) 1.25 MG (50000 UT) CAPS capsule TAKE 1 CAPSULE (50,000 UNITS TOTAL) BY MOUTH EVERY 7 (SEVEN) DAYS. (Patient not taking: Reported on 09/20/2018) 2 capsule 0   No facility-administered medications prior to visit.     No Known Allergies  Review of Systems  Constitutional: Negative for fever and malaise/fatigue.  HENT: Negative for congestion.   Eyes: Negative for blurred vision.  Respiratory: Negative for shortness of breath.   Cardiovascular: Negative for chest pain, palpitations and leg swelling.  Gastrointestinal: Negative for abdominal pain, blood in stool and  nausea.  Genitourinary: Negative for dysuria and frequency.  Musculoskeletal: Negative for falls.  Skin: Negative for rash.  Neurological: Negative for dizziness, loss of consciousness and headaches.  Endo/Heme/Allergies: Negative for environmental allergies.  Psychiatric/Behavioral: Negative for depression. The patient is not nervous/anxious.        Objective:    Physical Exam Constitutional:      Appearance: Normal appearance. She is not ill-appearing.  HENT:     Head: Normocephalic and atraumatic.     Nose: Nose  normal.  Pulmonary:     Effort: Pulmonary effort is normal.  Neurological:     Mental Status: She is alert and oriented to person, place, and time.  Psychiatric:        Mood and Affect: Mood normal.        Behavior: Behavior normal.     BP (!) 140/59 Comment: pt reported  Pulse (!) 58 Comment: pt reported  Wt 174 lb (78.9 kg) Comment: pt reported  BMI 32.88 kg/m  Wt Readings from Last 3 Encounters:  09/20/18 174 lb (78.9 kg)  06/11/18 175 lb (79.4 kg)  05/27/18 178 lb (80.7 kg)    Diabetic Foot Exam - Simple   No data filed     Lab Results  Component Value Date   WBC 7.0 12/11/2017   HGB 13.3 12/11/2017   HCT 40.4 12/11/2017   PLT 237.0 12/11/2017   GLUCOSE 97 04/03/2018   CHOL 148 07/02/2017   TRIG 102.0 07/02/2017   HDL 60.40 07/02/2017   LDLCALC 68 07/02/2017   ALT 51 (H) 04/03/2018   AST 37 04/03/2018   NA 142 04/03/2018   K 4.2 04/03/2018   CL 101 04/03/2018   CREATININE 0.68 04/03/2018   BUN 13 04/03/2018   CO2 25 04/03/2018   TSH 1.80 07/02/2017   HGBA1C 6.1 (H) 04/03/2018   MICROALBUR 3.06 (H) 05/16/2011    Lab Results  Component Value Date   TSH 1.80 07/02/2017   Lab Results  Component Value Date   WBC 7.0 12/11/2017   HGB 13.3 12/11/2017   HCT 40.4 12/11/2017   MCV 87.9 12/11/2017   PLT 237.0 12/11/2017   Lab Results  Component Value Date   NA 142 04/03/2018   K 4.2 04/03/2018   CO2 25 04/03/2018   GLUCOSE 97  04/03/2018   BUN 13 04/03/2018   CREATININE 0.68 04/03/2018   BILITOT 0.5 04/03/2018   ALKPHOS 84 04/03/2018   AST 37 04/03/2018   ALT 51 (H) 04/03/2018   PROT 6.9 04/03/2018   ALBUMIN 4.7 04/03/2018   CALCIUM 9.9 04/03/2018   ANIONGAP 6 11/27/2017   GFR 81.28 12/11/2017   Lab Results  Component Value Date   CHOL 148 07/02/2017   Lab Results  Component Value Date   HDL 60.40 07/02/2017   Lab Results  Component Value Date   LDLCALC 68 07/02/2017   Lab Results  Component Value Date   TRIG 102.0 07/02/2017   Lab Results  Component Value Date   CHOLHDL 2 07/02/2017   Lab Results  Component Value Date   HGBA1C 6.1 (H) 04/03/2018       Assessment & Plan:   Problem List Items Addressed This Visit    Hyperglycemia    minimize simple carbs. Increase exercise as tolerated.      Essential hypertension    Still not fully controlled, increase Metoprolol to 25 mg po bid.  Encouraged heart healthy diet such as the DASH diet and exercise as tolerated.       Relevant Medications   metoprolol succinate (TOPROL-XL) 25 MG 24 hr tablet   Educated About Covid-19 Virus Infection    Is doing a good job of staying at home, using masks, washing hands etc. She is encouraged to get adequate rest, hydration and nutrition as well as exercise. Vitamin C 1000 mg tid, Zinc, sodium bicarb 1/4 tsp tid         I have discontinued Sylvester Sundquist's Calcium Carbonate Antacid (TUMS PO), ranitidine, and Vitamin  D (Ergocalciferol). I have also changed her metoprolol succinate. Additionally, I am having her maintain her fish oil-omega-3 fatty acids, multivitamin with minerals, aspirin, rosuvastatin, omeprazole, and VITAMIN D PO.  Meds ordered this encounter  Medications  . metoprolol succinate (TOPROL-XL) 25 MG 24 hr tablet    Sig: Take 1 tablet (25 mg total) by mouth 2 (two) times daily.    Dispense:  30 tablet    Refill:  1     I discussed the assessment and treatment plan with the  patient. The patient was provided an opportunity to ask questions and all were answered. The patient agreed with the plan and demonstrated an understanding of the instructions.   The patient was advised to call back or seek an in-person evaluation if the symptoms worsen or if the condition fails to improve as anticipated.  I provided 25 minutes of non-face-to-face time during this encounter.   Penni Homans, MD

## 2018-09-22 NOTE — Assessment & Plan Note (Signed)
Still not fully controlled, increase Metoprolol to 25 mg po bid.  Encouraged heart healthy diet such as the DASH diet and exercise as tolerated.

## 2018-10-04 ENCOUNTER — Ambulatory Visit (INDEPENDENT_AMBULATORY_CARE_PROVIDER_SITE_OTHER): Payer: Managed Care, Other (non HMO) | Admitting: Family Medicine

## 2018-10-04 ENCOUNTER — Other Ambulatory Visit: Payer: Self-pay

## 2018-10-04 DIAGNOSIS — E782 Mixed hyperlipidemia: Secondary | ICD-10-CM | POA: Diagnosis not present

## 2018-10-04 DIAGNOSIS — R739 Hyperglycemia, unspecified: Secondary | ICD-10-CM

## 2018-10-04 DIAGNOSIS — I10 Essential (primary) hypertension: Secondary | ICD-10-CM

## 2018-10-04 DIAGNOSIS — E559 Vitamin D deficiency, unspecified: Secondary | ICD-10-CM

## 2018-10-04 MED ORDER — METOPROLOL SUCCINATE ER 50 MG PO TB24: 50.0000 mg | ORAL_TABLET | Freq: Every day | ORAL | 1 refills | Status: DC

## 2018-10-04 MED ORDER — TIZANIDINE HCL 2 MG PO TABS: 2.0000 mg | ORAL_TABLET | Freq: Two times a day (BID) | ORAL | 1 refills | Status: DC | PRN

## 2018-10-04 NOTE — Assessment & Plan Note (Addendum)
She has been forgetting the second dose of the Metoprolol roughly every other day but has tolerated it when she takes it. Let's increase the Metoprolol XR to 50 mg po daily

## 2018-10-06 NOTE — Assessment & Plan Note (Signed)
hgba1c acceptable, minimize simple carbs. Increase exercise as tolerated.  

## 2018-10-06 NOTE — Assessment & Plan Note (Signed)
Encouraged heart healthy diet, increase exercise, avoid trans fats, consider a krill oil cap daily 

## 2018-10-06 NOTE — Progress Notes (Signed)
Virtual Visit via Video Note  I connected with Margaret [name] on 10/06/18 at 10:40 AM EDT by a video enabled telemedicine application and verified that I am speaking with the correct person using two identifiers.  Location: Patient: home Provider: home   I discussed the limitations of evaluation and management by telemedicine and the availability of in person appointments. The patient expressed understanding and agreed to proceed. Margaret [name], CMA was able to get the patient set up on a video visit.    Subjective:    Patient ID: Margaret [name], female    DOB: 04-Dec-1950, 68 y.o.   MRN: [id number]  No chief complaint on file.   HPI Patient is in today for follow up on her hypertension medications. She was running high so we increased Metoprolol XR 25 mg once daily to twice daily. She has forgotten the extra dose 3 or 4 times but does note over all her blood pressure is improving and her pulse is still in her 48s. She has some headaches still but describes them starting with neck stiffness. No recent fall or injury. Otherwise feels well. Denies CP/palp/SOB/congestion/fevers/GI or GU c/o. Taking meds as prescribed  Past Medical History:  Diagnosis Date  . Allergic state 03/25/2012  . Allergy   . Aortic calcification ([place]) 08/27/2014  . Atherosclerosis of aorta ([place]) 04/12/2014  . Back pain   . Constipation 01/23/2017  . GERD (gastroesophageal reflux disease)   . Hiatal hernia with gastroesophageal reflux 04/27/2010   Qualifier: Diagnosis of  By: Danelle Earthly CMA, Darlene  Failed Omeprazole and Protonix   . Hx of adenomatous polyp of colon 01/23/2017   Colonoscopy 01/16/2017 with 2 polyps one was adenomatous. recommeded follow up in 5 years.performed by Dr Silverio Decamp  . Hyperlipidemia   . Joint pain   . Osteopenia 08/31/2012  . Peripheral edema 12/13/2014  . Sleep apnea   . Unspecified sinusitis (chronic) 03/30/2013    Past Surgical History:  Procedure Laterality Date  . [place] STUDY  N/A 12/15/2015   Procedure: [place] STUDY;  Surgeon: Mauri Pole, MD;  Location: [facility];  Service: Endoscopy;  Laterality: N/A;  . DILATION AND CURETTAGE OF UTERUS    . ESOPHAGEAL MANOMETRY N/A 12/15/2015   Procedure: ESOPHAGEAL MANOMETRY (EM);  Surgeon: Mauri Pole, MD;  Location: [facility];  Service: Endoscopy;  Laterality: N/A;    Family History  Problem Relation Age of Onset  . Stroke Mother   . High blood pressure Mother   . High Cholesterol Mother   . Lung cancer Father   . Stroke Sister        37  . Thyroid disease Sister   . Diabetes Sister   . Hypertension Sister   . Hyperlipidemia Sister   . Hypertension Sister   . Sleep apnea Son   . Obesity Son     Social History   Socioeconomic History  . Marital status: Married    Spouse name: [name]  . Number of children: 2  . Years of education: 51  . Highest education level: Not on file  Occupational History  . 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 Financial resource strain: Not on file  . Food insecurity:    Worry: Not on file    Inability: Not on file  . Transportation needs:    Medical: Not on file    Non-medical: Not on file  Tobacco Use  . Smoking status: Never Smoker  . Smokeless  tobacco: Never Used  Substance and Sexual Activity  . Alcohol use: Yes    Alcohol/week: 0.0 standard drinks    Comment: Rarely  . Drug use: No  . Sexual activity: Not Currently    Comment: no dietary restrictions, lives with husand, works at [employer]  . Physical activity:    Days per week: Not on file    Minutes per session: Not on file  . Stress: Not on file  Relationships  . Social connections:    Talks on phone: Not on file    Gets together: Not on file    Attends religious service: Not on file    Active member of club or organization: Not on file    Attends meetings of clubs or organizations: Not on file    Relationship status: Not on file  . Intimate partner  violence:    Fear of current or ex partner: Not on file    Emotionally abused: Not on file    Physically abused: Not on file    Forced sexual activity: Not on file  Other Topics Concern  . Not on file  Social History Narrative   Lives with husband   Caffeine- tea, 2 cups daily    Outpatient Medications Prior to Visit  Medication Sig Dispense Refill  . aspirin 81 MG chewable tablet Chew by mouth daily.    . fish oil-omega-3 fatty acids 1000 MG capsule Take 2 g by mouth daily.    . Multiple Vitamin (MULTIVITAMIN WITH MINERALS) TABS tablet Take 1 tablet by mouth daily.    Marland Kitchen omeprazole (PRILOSEC) 40 MG capsule TAKE 1 CAPSULE (40 MG TOTAL) BY MOUTH 2 (TWO) TIMES DAILY BEFORE A MEAL. 60 capsule 11  . rosuvastatin (CRESTOR) 10 MG tablet Take 1 tablet (10 mg total) by mouth daily. 30 tablet 6  . VITAMIN D PO Take 2,500 Units by mouth daily.    . metoprolol succinate (TOPROL-XL) 25 MG 24 hr tablet Take 1 tablet (25 mg total) by mouth 2 (two) times daily. 30 tablet 1   No facility-administered medications prior to visit.     No Known Allergies  Review of Systems  Constitutional: Negative for fever and malaise/fatigue.  HENT: Negative for congestion.   Eyes: Negative for blurred vision.  Respiratory: Negative for shortness of breath.   Cardiovascular: Negative for chest pain, palpitations and leg swelling.  Gastrointestinal: Negative for abdominal pain, blood in stool and nausea.  Genitourinary: Negative for dysuria and frequency.  Musculoskeletal: Negative for falls.  Skin: Negative for rash.  Neurological: Positive for headaches. Negative for dizziness and loss of consciousness.  Endo/Heme/Allergies: Negative for environmental allergies.  Psychiatric/Behavioral: Negative for depression. The patient is not nervous/anxious.        Objective:    Physical Exam Constitutional:      Appearance: Normal appearance. She is not ill-appearing.  HENT:     Head: Normocephalic and  atraumatic.     Nose: Nose normal.  Pulmonary:     Effort: Pulmonary effort is normal.  Neurological:     Mental Status: She is alert and oriented to person, place, and time.  Psychiatric:        Mood and Affect: Mood normal.        Behavior: Behavior normal.     There were no vitals taken for this visit. Wt Readings from Last 3 Encounters:  09/20/18 174 lb (78.9 kg)  06/11/18 175 lb (79.4 kg)  05/27/18 178 lb (80.7 kg)  Diabetic Foot Exam - Simple   No data filed     Lab Results  Component Value Date   WBC 7.0 12/11/2017   HGB 13.3 12/11/2017   HCT 40.4 12/11/2017   PLT 237.0 12/11/2017   GLUCOSE 97 04/03/2018   CHOL 148 07/02/2017   TRIG 102.0 07/02/2017   HDL 60.40 07/02/2017   LDLCALC 68 07/02/2017   ALT 51 (H) 04/03/2018   AST 37 04/03/2018   NA 142 04/03/2018   K 4.2 04/03/2018   CL 101 04/03/2018   CREATININE 0.68 04/03/2018   BUN 13 04/03/2018   CO2 25 04/03/2018   TSH 1.80 07/02/2017   HGBA1C 6.1 (H) 04/03/2018   MICROALBUR 3.06 (H) 05/16/2011    Lab Results  Component Value Date   TSH 1.80 07/02/2017   Lab Results  Component Value Date   WBC 7.0 12/11/2017   HGB 13.3 12/11/2017   HCT 40.4 12/11/2017   MCV 87.9 12/11/2017   PLT 237.0 12/11/2017   Lab Results  Component Value Date   NA 142 04/03/2018   K 4.2 04/03/2018   CO2 25 04/03/2018   GLUCOSE 97 04/03/2018   BUN 13 04/03/2018   CREATININE 0.68 04/03/2018   BILITOT 0.5 04/03/2018   ALKPHOS 84 04/03/2018   AST 37 04/03/2018   ALT 51 (H) 04/03/2018   PROT 6.9 04/03/2018   ALBUMIN 4.7 04/03/2018   CALCIUM 9.9 04/03/2018   ANIONGAP 6 11/27/2017   GFR 81.28 12/11/2017   Lab Results  Component Value Date   CHOL 148 07/02/2017   Lab Results  Component Value Date   HDL 60.40 07/02/2017   Lab Results  Component Value Date   LDLCALC 68 07/02/2017   Lab Results  Component Value Date   TRIG 102.0 07/02/2017   Lab Results  Component Value Date   CHOLHDL 2 07/02/2017    Lab Results  Component Value Date   HGBA1C 6.1 (H) 04/03/2018       Assessment & Plan:   Problem List Items Addressed This Visit    Hyperlipidemia, mixed    Encouraged heart healthy diet, increase exercise, avoid trans fats, consider a krill oil cap daily      Relevant Medications   metoprolol succinate (TOPROL-XL) 50 MG 24 hr tablet   Other Relevant Orders   Lipid panel   Hyperglycemia - Primary    hgba1c acceptable, minimize simple carbs. Increase exercise as tolerated.       Relevant Orders   Hemoglobin A1c   TSH   Essential hypertension    She has been forgetting the second dose of the Metoprolol roughly every other day but has tolerated it when she takes it. Let's increase the Metoprolol XR to 50 mg po daily      Relevant Medications   metoprolol succinate (TOPROL-XL) 50 MG 24 hr tablet   Other Relevant Orders   CBC   Comprehensive metabolic panel   Vitamin D deficiency   Relevant Orders   VITAMIN D 25 Hydroxy (Vit-D Deficiency, Fractures)      I have discontinued Rosezetta Alto's metoprolol succinate. I am also having her start on tiZANidine and metoprolol succinate. Additionally, I am having her maintain her fish oil-omega-3 fatty acids, multivitamin with minerals, aspirin, rosuvastatin, omeprazole, and VITAMIN D PO.  Meds ordered this encounter  Medications  . tiZANidine (ZANAFLEX) 2 MG tablet    Sig: Take 1-2 tablets (2-4 mg total) by mouth 2 (two) times daily as needed for muscle spasms.  Dispense:  30 tablet    Refill:  1  . metoprolol succinate (TOPROL-XL) 50 MG 24 hr tablet    Sig: Take 1 tablet (50 mg total) by mouth daily. Take with or immediately following a meal.    Dispense:  90 tablet    Refill:  1     I discussed the assessment and treatment plan with the patient. The patient was provided an opportunity to ask questions and all were answered. The patient agreed with the plan and demonstrated an understanding of the instructions.   The  patient was advised to call back or seek an in-person evaluation if the symptoms worsen or if the condition fails to improve as anticipated.  I provided 15 minutes of non-face-to-face time during this encounter.   Penni Homans, MD

## 2018-10-09 ENCOUNTER — Other Ambulatory Visit (INDEPENDENT_AMBULATORY_CARE_PROVIDER_SITE_OTHER): Payer: Managed Care, Other (non HMO)

## 2018-10-09 ENCOUNTER — Other Ambulatory Visit: Payer: Self-pay

## 2018-10-09 DIAGNOSIS — E559 Vitamin D deficiency, unspecified: Secondary | ICD-10-CM

## 2018-10-09 DIAGNOSIS — E782 Mixed hyperlipidemia: Secondary | ICD-10-CM

## 2018-10-09 DIAGNOSIS — I10 Essential (primary) hypertension: Secondary | ICD-10-CM | POA: Diagnosis not present

## 2018-10-09 DIAGNOSIS — R739 Hyperglycemia, unspecified: Secondary | ICD-10-CM

## 2018-10-09 LAB — HEMOGLOBIN A1C: Hgb A1c MFr Bld: 6.1 % (ref 4.6–6.5)

## 2018-10-09 LAB — COMPREHENSIVE METABOLIC PANEL
ALT: 26 U/L (ref 0–35)
AST: 21 U/L (ref 0–37)
Albumin: 4.1 g/dL (ref 3.5–5.2)
Alkaline Phosphatase: 74 U/L (ref 39–117)
BUN: 17 mg/dL (ref 6–23)
CO2: 30 mEq/L (ref 19–32)
Calcium: 8.8 mg/dL (ref 8.4–10.5)
Chloride: 102 mEq/L (ref 96–112)
Creatinine, Ser: 0.66 mg/dL (ref 0.40–1.20)
GFR: 89.77 mL/min (ref >60.00)
Glucose, Bld: 94 mg/dL (ref 70–99)
Potassium: 4.4 mEq/L (ref 3.5–5.1)
Sodium: 137 mEq/L (ref 135–145)
Total Bilirubin: 0.4 mg/dL (ref 0.2–1.2)
Total Protein: 6.6 g/dL (ref 6.0–8.3)

## 2018-10-09 LAB — CBC
HCT: 39.9 % (ref 36.0–46.0)
Hemoglobin: 13.3 g/dL (ref 12.0–15.0)
MCHC: 33.2 g/dL (ref 30.0–36.0)
MCV: 87.4 fl (ref 78.0–100.0)
Platelets: 207 10*3/uL (ref 150.0–400.0)
RBC: 4.57 Mil/uL (ref 3.87–5.11)
RDW: 14.2 % (ref 11.5–15.5)
WBC: 5.8 10*3/uL (ref 4.0–10.5)

## 2018-10-09 LAB — LIPID PANEL
Cholesterol: 158 mg/dL (ref 0–200)
HDL: 52.1 mg/dL (ref >39.00)
LDL Cholesterol: 80 mg/dL (ref 0–99)
NonHDL: 106.36
Total CHOL/HDL Ratio: 3
Triglycerides: 130 mg/dL (ref 0.0–149.0)
VLDL: 26 mg/dL (ref 0.0–40.0)

## 2018-10-09 LAB — TSH: TSH: 1.56 u[IU]/mL (ref 0.35–4.50)

## 2018-10-09 LAB — VITAMIN D 25 HYDROXY (VIT D DEFICIENCY, FRACTURES): VITD: 29.66 ng/mL — ABNORMAL LOW (ref 30.00–100.00)

## 2018-11-01 ENCOUNTER — Ambulatory Visit (INDEPENDENT_AMBULATORY_CARE_PROVIDER_SITE_OTHER): Payer: Managed Care, Other (non HMO) | Admitting: Family Medicine

## 2018-11-01 ENCOUNTER — Other Ambulatory Visit: Payer: Self-pay

## 2018-11-01 ENCOUNTER — Encounter: Payer: Self-pay | Admitting: Family Medicine

## 2018-11-01 ENCOUNTER — Other Ambulatory Visit: Payer: Self-pay | Admitting: Family Medicine

## 2018-11-01 ENCOUNTER — Telehealth: Payer: Self-pay | Admitting: Family Medicine

## 2018-11-01 DIAGNOSIS — E559 Vitamin D deficiency, unspecified: Secondary | ICD-10-CM | POA: Diagnosis not present

## 2018-11-01 DIAGNOSIS — E782 Mixed hyperlipidemia: Secondary | ICD-10-CM

## 2018-11-01 DIAGNOSIS — I10 Essential (primary) hypertension: Secondary | ICD-10-CM

## 2018-11-01 DIAGNOSIS — G44209 Tension-type headache, unspecified, not intractable: Secondary | ICD-10-CM

## 2018-11-01 DIAGNOSIS — R739 Hyperglycemia, unspecified: Secondary | ICD-10-CM | POA: Diagnosis not present

## 2018-11-01 DIAGNOSIS — G4733 Obstructive sleep apnea (adult) (pediatric): Secondary | ICD-10-CM

## 2018-11-01 MED ORDER — ROSUVASTATIN CALCIUM 10 MG PO TABS: 10.0000 mg | ORAL_TABLET | Freq: Every day | ORAL | 6 refills | Status: DC

## 2018-11-01 NOTE — Assessment & Plan Note (Signed)
Needs reevaluation of CPAP machine, she feels it is not always working and is noting a morning HA despite use at times. Will set her up with follow up with pulmonology

## 2018-11-01 NOTE — Telephone Encounter (Signed)
LVM for patient to call back to schedule 3 month f/u (around the first week in September).

## 2018-11-01 NOTE — Progress Notes (Signed)
Virtual Visit via Video Note  I connected with Margaret [name] on 11/01/2018 at  1:00 PM EDT by a video enabled telemedicine application and verified that I am speaking with the correct person using two identifiers.  Location: Patient: home Provider: home   I discussed the limitations of evaluation and management by telemedicine and the availability of in person appointments. The patient expressed understanding and agreed to proceed. Margaret [name], CMA was able to get the patient set up on a video visit.     Subjective:    Patient ID: Margaret [name], female    DOB: November 03, 1950, 68 y.o.   MRN: [id number]  No chief complaint on file.   HPI Patient is in today for follow up on chronic medical concerns most notably hypertension, hyperlipidemia and vitamin D deficiency. No recent febrile illness or hospitalizations. She does note feeling light headed at times but no falls or syncope. Acknowledges she might not have been drinking enough water lately. She also notes some recent headaches although it has been better the last few days. She wakes with them some mornings. She feels her CPAP is not working quite right at the present time. Denies CP/palp/SOB/HA/congestion/fevers/GI or GU c/o. Taking meds as prescribed  Past Medical History:  Diagnosis Date  . Allergic state 03/25/2012  . Allergy   . Aortic calcification ([facility] [employer]) 08/27/2014  . Atherosclerosis of aorta ([place]) 04/12/2014  . Back pain   . Constipation 01/23/2017  . GERD (gastroesophageal reflux disease)   . Hiatal hernia with gastroesophageal reflux 04/27/2010   Qualifier: Diagnosis of  By: Danelle Earthly CMA, Darlene  Failed Omeprazole and Protonix   . Hx of adenomatous polyp of colon 01/23/2017   Colonoscopy 01/16/2017 with 2 polyps one was adenomatous. recommeded follow up in 5 years.performed by Dr Silverio Decamp  . Hyperlipidemia   . Joint pain   . Osteopenia 08/31/2012  . Peripheral edema 12/13/2014  . Sleep apnea   . Unspecified sinusitis  (chronic) 03/30/2013    Past Surgical History:  Procedure Laterality Date  . [place] STUDY N/A 12/15/2015   Procedure: [place] STUDY;  Surgeon: Mauri Pole, MD;  [facility];  Service: Endoscopy;  Laterality: N/A;  . DILATION AND CURETTAGE OF UTERUS    . ESOPHAGEAL MANOMETRY N/A 12/15/2015   Procedure: ESOPHAGEAL MANOMETRY (EM);  Surgeon: Mauri Pole, MD;  Location: [facility];  Service: Endoscopy;  Laterality: N/A;    Family History  Problem Relation Age of Onset  . Stroke Mother   . High blood pressure Mother   . High Cholesterol Mother   . Lung cancer Father   . Stroke Sister        36  . Thyroid disease Sister   . Diabetes Sister   . Hypertension Sister   . Hyperlipidemia Sister   . Hypertension Sister   . Sleep apnea Son   . Obesity Son     Social History   Socioeconomic History  . Marital status: Married    Spouse name: [name]  . Number of children: 2  . Years of education: 40  . Highest education level: Not on file  Occupational History  . 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 Financial resource strain: Not on file  . Food insecurity:    Worry: Not on file    Inability: Not on file  . Transportation needs:    Medical: Not on file    Non-medical: Not on file  Tobacco Use  .  Smoking status: Never Smoker  . Smokeless tobacco: Never Used  Substance and Sexual Activity  . Alcohol use: Yes    Alcohol/week: 0.0 standard drinks    Comment: Rarely  . Drug use: No  . Sexual activity: Not Currently    Comment: no dietary restrictions, lives with husand, works at [employer]  . Physical activity:    Days per week: Not on file    Minutes per session: Not on file  . Stress: Not on file  Relationships  . Social connections:    Talks on phone: Not on file    Gets together: Not on file    Attends religious service: Not on file    Active member of club or organization: Not on file    Attends meetings of  clubs or organizations: Not on file    Relationship status: Not on file  . Intimate partner violence:    Fear of current or ex partner: Not on file    Emotionally abused: Not on file    Physically abused: Not on file    Forced sexual activity: Not on file  Other Topics Concern  . Not on file  Social History Narrative   Lives with husband   Caffeine- tea, 2 cups daily    Outpatient Medications Prior to Visit  Medication Sig Dispense Refill  . aspirin 81 MG chewable tablet Chew by mouth daily.    . fish oil-omega-3 fatty acids 1000 MG capsule Take 2 g by mouth daily.    . metoprolol succinate (TOPROL-XL) 50 MG 24 hr tablet Take 1 tablet (50 mg total) by mouth daily. Take with or immediately following a meal. 90 tablet 1  . Multiple Vitamin (MULTIVITAMIN WITH MINERALS) TABS tablet Take 1 tablet by mouth daily.    Marland Kitchen omeprazole (PRILOSEC) 40 MG capsule TAKE 1 CAPSULE (40 MG TOTAL) BY MOUTH 2 (TWO) TIMES DAILY BEFORE A MEAL. 60 capsule 11  . tiZANidine (ZANAFLEX) 2 MG tablet Take 1-2 tablets (2-4 mg total) by mouth 2 (two) times daily as needed for muscle spasms. 30 tablet 1  . VITAMIN D PO Take 2,500 Units by mouth daily.    . rosuvastatin (CRESTOR) 10 MG tablet Take 1 tablet (10 mg total) by mouth daily. 30 tablet 6   No facility-administered medications prior to visit.     No Known Allergies  Review of Systems  Constitutional: Negative for fever and malaise/fatigue.  HENT: Negative for congestion.   Eyes: Negative for blurred vision.  Respiratory: Negative for shortness of breath.   Cardiovascular: Negative for chest pain, palpitations and leg swelling.  Gastrointestinal: Negative for abdominal pain, blood in stool and nausea.  Genitourinary: Negative for dysuria and frequency.  Musculoskeletal: Negative for falls.  Skin: Negative for rash.  Neurological: Positive for dizziness and headaches. Negative for sensory change, speech change, focal weakness and loss of consciousness.   Endo/Heme/Allergies: Negative for environmental allergies.  Psychiatric/Behavioral: Negative for depression. The patient is not nervous/anxious.        Objective:    Physical Exam Constitutional:      Appearance: Normal appearance. She is not ill-appearing.  HENT:     Head: Normocephalic and atraumatic.     Nose: Nose normal.  Eyes:     General:        Right eye: No discharge.        Left eye: No discharge.  Neurological:     Mental Status: She is alert.     BP 133/70 (  BP Location: Left Arm, Patient Position: Sitting, Cuff Size: Normal)   Pulse 60   Wt 174 lb (78.9 kg)   BMI 32.88 kg/m  Wt Readings from Last 3 Encounters:  11/01/18 174 lb (78.9 kg)  09/20/18 174 lb (78.9 kg)  06/11/18 175 lb (79.4 kg)    Diabetic Foot Exam - Simple   No data filed     Lab Results  Component Value Date   WBC 5.8 10/09/2018   HGB 13.3 10/09/2018   HCT 39.9 10/09/2018   PLT 207.0 10/09/2018   GLUCOSE 94 10/09/2018   CHOL 158 10/09/2018   TRIG 130.0 10/09/2018   HDL 52.10 10/09/2018   LDLCALC 80 10/09/2018   ALT 26 10/09/2018   AST 21 10/09/2018   NA 137 10/09/2018   K 4.4 10/09/2018   CL 102 10/09/2018   CREATININE 0.66 10/09/2018   BUN 17 10/09/2018   CO2 30 10/09/2018   TSH 1.56 10/09/2018   HGBA1C 6.1 10/09/2018   MICROALBUR 3.06 (H) 05/16/2011    Lab Results  Component Value Date   TSH 1.56 10/09/2018   Lab Results  Component Value Date   WBC 5.8 10/09/2018   HGB 13.3 10/09/2018   HCT 39.9 10/09/2018   MCV 87.4 10/09/2018   PLT 207.0 10/09/2018   Lab Results  Component Value Date   NA 137 10/09/2018   K 4.4 10/09/2018   CO2 30 10/09/2018   GLUCOSE 94 10/09/2018   BUN 17 10/09/2018   CREATININE 0.66 10/09/2018   BILITOT 0.4 10/09/2018   ALKPHOS 74 10/09/2018   AST 21 10/09/2018   ALT 26 10/09/2018   PROT 6.6 10/09/2018   ALBUMIN 4.1 10/09/2018   CALCIUM 8.8 10/09/2018   ANIONGAP 6 11/27/2017   GFR 89.77 10/09/2018   Lab Results  Component  Value Date   CHOL 158 10/09/2018   Lab Results  Component Value Date   HDL 52.10 10/09/2018   Lab Results  Component Value Date   LDLCALC 80 10/09/2018   Lab Results  Component Value Date   TRIG 130.0 10/09/2018   Lab Results  Component Value Date   CHOLHDL 3 10/09/2018   Lab Results  Component Value Date   HGBA1C 6.1 10/09/2018       Assessment & Plan:   Problem List Items Addressed This Visit    Hyperlipidemia, mixed    Encouraged heart healthy diet, increase exercise, avoid trans fats, consider a fish oil cap daily      Relevant Medications   rosuvastatin (CRESTOR) 10 MG tablet   Hyperglycemia    hgba1c acceptable, minimize simple carbs. Increase exercise as tolerated.      Essential hypertension    Well controlled, no changes to meds. Encouraged heart healthy diet such as the DASH diet and exercise as tolerated.       Relevant Medications   rosuvastatin (CRESTOR) 10 MG tablet   Obstructive sleep apnea syndrome    Needs reevaluation of CPAP machine, she feels it is not always working and is noting a morning HA despite use at times. Will set her up with follow up with pulmonology      Relevant Orders   Ambulatory referral to Pulmonology   Vitamin D deficiency    Vitamin D very mildly low. Increase from 1000 IU to 2000 IU daily      Headache    Was having more frequent headaches but have settled some this week. Is noting some HA upon awakening still. Have referred  back to pulmonology. Encouraged increased hydration, 64 ounces of clear fluids daily. Minimize alcohol and caffeine. Eat small frequent meals with lean proteins and complex carbs. Avoid high and low blood sugars. Get adequate sleep, 7-8 hours a night. Needs exercise daily preferably in the morning.         I am having Laveda Garant maintain her fish oil-omega-3 fatty acids, multivitamin with minerals, aspirin, omeprazole, VITAMIN D PO, tiZANidine, metoprolol succinate, and rosuvastatin.   Meds ordered this encounter  Medications  . rosuvastatin (CRESTOR) 10 MG tablet    Sig: Take 1 tablet (10 mg total) by mouth daily.    Dispense:  30 tablet    Refill:  6     I discussed the assessment and treatment plan with the patient. The patient was provided an opportunity to ask questions and all were answered. The patient agreed with the plan and demonstrated an understanding of the instructions.   The patient was advised to call back or seek an in-person evaluation if the symptoms worsen or if the condition fails to improve as anticipated.  I provided 25 minutes of non-face-to-face time during this encounter.   Penni Homans, MD

## 2018-11-01 NOTE — Assessment & Plan Note (Signed)
Was having more frequent headaches but have settled some this week. Is noting some HA upon awakening still. Have referred back to pulmonology. Encouraged increased hydration, 64 ounces of clear fluids daily. Minimize alcohol and caffeine. Eat small frequent meals with lean proteins and complex carbs. Avoid high and low blood sugars. Get adequate sleep, 7-8 hours a night. Needs exercise daily preferably in the morning.

## 2018-11-01 NOTE — Assessment & Plan Note (Signed)
Vitamin D very mildly low. Increase from 1000 IU to 2000 IU daily

## 2018-11-01 NOTE — Assessment & Plan Note (Addendum)
Encouraged heart healthy diet, increase exercise, avoid trans fats, consider a fish oil cap daily

## 2018-11-01 NOTE — Assessment & Plan Note (Signed)
Well controlled, no changes to meds. Encouraged heart healthy diet such as the DASH diet and exercise as tolerated.  °

## 2018-11-01 NOTE — Assessment & Plan Note (Signed)
hgba1c acceptable, minimize simple carbs. Increase exercise as tolerated.  

## 2018-11-03 NOTE — Assessment & Plan Note (Signed)
She is waking more with headaches now and questions if her CPAP and masking is correct at present is referred back to pulmonology for further evaluation

## 2018-11-11 ENCOUNTER — Ambulatory Visit (INDEPENDENT_AMBULATORY_CARE_PROVIDER_SITE_OTHER): Payer: Managed Care, Other (non HMO) | Admitting: Internal Medicine

## 2018-11-11 ENCOUNTER — Encounter: Payer: Self-pay | Admitting: Internal Medicine

## 2018-11-11 ENCOUNTER — Other Ambulatory Visit: Payer: Self-pay

## 2018-11-11 VITALS — BP 130/80 | HR 60 | Temp 98.1°F | Ht 62.0 in | Wt 177.6 lb

## 2018-11-11 DIAGNOSIS — G4733 Obstructive sleep apnea (adult) (pediatric): Secondary | ICD-10-CM

## 2018-11-11 NOTE — Patient Instructions (Signed)
Order- DME [facility]- please replace old CPAP machne, change to auto 5-15, mask of choice, humidifier, supplies, Air[facility]/ card  You should have a booklet for your old machine, that will tell you how to adjust the humidifier to help with dryness.  If you are sleeping with your mouth open, that will also cause a dry mouth in the morning.

## 2018-11-11 NOTE — Assessment & Plan Note (Signed)
She is overweight and this will impact several of her health problems. Weight loss encouraged.

## 2018-11-11 NOTE — Assessment & Plan Note (Signed)
Not sure how much her complaints relate to CPAP use. Machine is old enough to replace.  Plan- Education done. Replace old machine, change to auto 5-15, adjust humidifier, consider mask options.

## 2018-11-11 NOTE — Progress Notes (Signed)
11/11/2018- 68 yo Married F, working as Optometrist, never smoker, for sleep evaluation NPSG 05/2007 >> AHI 49/h, lowest desatn 71%, , corrected by CPAP 9 cm, placed on auto CPAP with nasal pillows  Saw Dr Elsworth Soho in 2016 for new machine.  Medical problem list includes HBP, CVA, ASCVD, HH/ GERD, Lipoma upper left back, Morbid Obesity, -----CPAP DME: [facility]. Patient reports that she does not use machine nightly due to drying her mouth out and causes a headache. We are unable to download her SD card at this visit. Machine is old. CPAP 5-20/ Huey Romans Feels her CPAP is not working right- morning headaches, dry mouth. Admits same symptoms without CPAP and admits she sleeps better/ better rested when she uses it.  Body weight today 177 lbs, Epworth score 11 Denies ENT surgery, heart or lung probs.   Prior to Admission medications   Medication Sig Start Date End Date Taking? Authorizing Provider  aspirin 81 MG chewable tablet Chew by mouth daily.   Yes [provider]  fish oil-omega-3 fatty acids 1000 MG capsule Take 2 g by mouth daily.   Yes [provider]  metoprolol succinate (TOPROL-XL) 50 MG 24 hr tablet Take 1 tablet (50 mg total) by mouth daily. Take with or immediately following a meal. 10/04/18  Yes Mosie Lukes, MD  Multiple Vitamin (MULTIVITAMIN WITH MINERALS) TABS tablet Take 1 tablet by mouth daily.   Yes [provider]  omeprazole (PRILOSEC) 40 MG capsule TAKE 1 CAPSULE (40 MG TOTAL) BY MOUTH 2 (TWO) TIMES DAILY BEFORE A MEAL. 07/19/18  Yes Nandigam, Kavitha V, MD  rosuvastatin (CRESTOR) 10 MG tablet Take 1 tablet (10 mg total) by mouth daily. 11/01/18  Yes Mosie Lukes, MD  tiZANidine (ZANAFLEX) 2 MG tablet Take 1-2 tablets (2-4 mg total) by mouth 2 (two) times daily as needed for muscle spasms. 10/04/18  Yes Mosie Lukes, MD  VITAMIN D PO Take 2,500 Units by mouth daily.   Yes [provider]  Zinc 50 MG TABS Take 1 tablet by mouth daily.   Yes [provider]   Past Medical History:  Diagnosis Date  . Allergic state 03/25/2012  . Allergy   . Aortic calcification ([place]) 08/27/2014  . Atherosclerosis of aorta ([place]) 04/12/2014  . Back pain   . Constipation 01/23/2017  . GERD (gastroesophageal reflux disease)   . Hiatal hernia with gastroesophageal reflux 04/27/2010   Qualifier: Diagnosis of  By: Danelle Earthly CMA, Darlene  Failed Omeprazole and Protonix   . Hx of adenomatous polyp of colon 01/23/2017   Colonoscopy 01/16/2017 with 2 polyps one was adenomatous. recommeded follow up in 5 years.performed by Dr Silverio Decamp  . Hyperlipidemia   . Joint pain   . Osteopenia 08/31/2012  . Peripheral edema 12/13/2014  . Sleep apnea   . Unspecified sinusitis (chronic) 03/30/2013   Past Surgical History:  Procedure Laterality Date  . [place] STUDY N/A 12/15/2015   Procedure: [place] STUDY;  Surgeon: Mauri Pole, MD;  Location: [facility];  Service: Endoscopy;  Laterality: N/A;  . DILATION AND CURETTAGE OF UTERUS    . ESOPHAGEAL MANOMETRY N/A 12/15/2015   Procedure: ESOPHAGEAL MANOMETRY (EM);  Surgeon: Mauri Pole, MD;  Location: [facility];  Service: Endoscopy;  Laterality: N/A;   Family History  Problem Relation Age of Onset  . Stroke Mother   . High blood pressure Mother   . High Cholesterol Mother   . Lung cancer Father   .  Stroke Sister        25  . Thyroid disease Sister   . Diabetes Sister   . Hypertension Sister   . Hyperlipidemia Sister   . Hypertension Sister   . Sleep apnea Son   . Obesity Son    Social History   Socioeconomic History  . Marital status: Married    Spouse name: [name]  . Number of children: 2  . Years of education: 53  . Highest education level: Not on file  Occupational History  . 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 Financial resource strain: Not on file  . Food insecurity    Worry: Not on file    Inability: Not on file  . Transportation needs    Medical:  Not on file    Non-medical: Not on file  Tobacco Use  . Smoking status: Never Smoker  . Smokeless tobacco: Never Used  Substance and Sexual Activity  . Alcohol use: Yes    Alcohol/week: 0.0 standard drinks    Comment: Rarely  . Drug use: No  . Sexual activity: Not Currently    Comment: no dietary restrictions, lives with husand, works at [employer]  . Physical activity    Days per week: Not on file    Minutes per session: Not on file  . Stress: Not on file  Relationships  . Social Herbalist on phone: Not on file    Gets together: Not on file    Attends religious service: Not on file    Active member of club or organization: Not on file    Attends meetings of clubs or organizations: Not on file    Relationship status: Not on file  . Intimate partner violence    Fear of current or ex partner: Not on file    Emotionally abused: Not on file    Physically abused: Not on file    Forced sexual activity: Not on file  Other Topics Concern  . Not on file  Social History Narrative   Lives with husband   Caffeine- tea, 2 cups daily   ROS-see HPI   + = positive Constitutional:    weight loss, night sweats, fevers, chills, fatigue, lassitude. HEENT:   + headaches, difficulty swallowing, tooth/dental problems, sore throat,      + sneezing, itching, ear ache, nasal congestion, post nasal drip, snoring CV:    chest pain, orthopnea, PND, swelling in lower extremities, anasarca,                                  dizziness, palpitations Resp:   shortness of breath with exertion or at rest.                productive cough,   non-productive cough, coughing up of blood.              change in color of mucus.  wheezing.   Skin:    rash or lesions. GI:  +heartburn, +indigestion, abdominal pain, nausea, vomiting, diarrhea,                 change in bowel habits, loss of appetite GU: dysuria, change in color of urine, no urgency or frequency.   flank pain. MS:   joint pain,  stiffness, decreased range of motion, back pain. Neuro-     nothing unusual Psych:  change in  mood or affect.  depression or anxiety.   memory loss.  OBJ- Physical Exam General- Alert, Oriented, Affect-appropriate, Distress- none acute Skin- rash-none, lesions- none, excoriation- none Lymphadenopathy- none Head- atraumatic            Eyes- Gross vision intact, PERRLA, conjunctivae and secretions clear            Ears- Hearing, canals-normal            Nose- Clear, no-Septal dev, mucus, polyps, erosion, perforation             Throat- Mallampati IV , mucosa clear , drainage- none, tonsils- atrophic Neck- flexible , trachea midline, no stridor , thyroid nl, carotid no bruit Chest - symmetrical excursion , unlabored           Heart/CV- RRR , no murmur , no gallop  , no rub, nl s1 s2                           - JVD- none , edema- none, stasis changes- none, varices- none           Lung- clear to P&A, wheeze- none, cough- none , dullness-none, rub- none           Chest wall-  Abd-  Br/ Gen/ Rectal- Not done, not indicated Extrem- cyanosis- none, clubbing, none, atrophy- none, strength- nl Neuro- grossly intact to observation

## 2018-12-30 ENCOUNTER — Other Ambulatory Visit: Payer: Self-pay

## 2018-12-30 ENCOUNTER — Ambulatory Visit (INDEPENDENT_AMBULATORY_CARE_PROVIDER_SITE_OTHER): Payer: Managed Care, Other (non HMO) | Admitting: Family Medicine

## 2018-12-30 ENCOUNTER — Encounter: Payer: Self-pay | Admitting: Family Medicine

## 2018-12-30 DIAGNOSIS — J329 Chronic sinusitis, unspecified: Secondary | ICD-10-CM | POA: Diagnosis not present

## 2018-12-30 DIAGNOSIS — R739 Hyperglycemia, unspecified: Secondary | ICD-10-CM

## 2018-12-30 DIAGNOSIS — I10 Essential (primary) hypertension: Secondary | ICD-10-CM

## 2018-12-30 DIAGNOSIS — G4733 Obstructive sleep apnea (adult) (pediatric): Secondary | ICD-10-CM | POA: Diagnosis not present

## 2018-12-30 MED ORDER — CEFDINIR 300 MG PO CAPS: 300.0000 mg | ORAL_CAPSULE | Freq: Two times a day (BID) | ORAL | 0 refills | Status: AC

## 2018-12-30 NOTE — Progress Notes (Signed)
Virtual Visit via Video Note  I connected with Margaret [name] on 12/30/18 at  3:00 PM EDT by a video enabled telemedicine application and verified that I am speaking with the correct person using two identifiers.  Location: Patient: home Provider: office   I discussed the limitations of evaluation and management by telemedicine and the availability of in person appointments. The patient expressed understanding and agreed to proceed. [name], CMA was able to get patient set up on visit, video.    Subjective:    Patient ID: Margaret [name], female    DOB: [birth year], 68 y.o.   MRN: [id number]  Chief Complaint  Patient presents with  . Headache    HPI Patient is in today for follow up on hypertension and worsening headaches. She has been trying to use her CPAP since her sleep study confirmed sleep apnea. Unfortunately she keeps needing to stop using it as when she uses it she wakes up with a headache. She notes her head has more congestion and facial pressure recently. No hearing or vision loss and no other neurologic complaints. Denies CP/palp/SOB/fevers/GI or GU c/o. Taking meds as prescribed  Past Medical History:  Diagnosis Date  . Allergic state 03/25/2012  . Allergy   . Aortic calcification ([place]) 08/27/2014  . Atherosclerosis of aorta ([place]) 04/12/2014  . Back pain   . Constipation 01/23/2017  . GERD (gastroesophageal reflux disease)   . Hiatal hernia with gastroesophageal reflux 04/27/2010   Qualifier: Diagnosis of  By: Danelle Earthly CMA, Darlene  Failed Omeprazole and Protonix   . Hx of adenomatous polyp of colon 01/23/2017   Colonoscopy 01/16/2017 with 2 polyps one was adenomatous. recommeded follow up in 5 years.performed by Dr Silverio Decamp  . Hyperlipidemia   . Joint pain   . Osteopenia 08/31/2012  . Peripheral edema 12/13/2014  . Sleep apnea   . Unspecified sinusitis (chronic) 03/30/2013    Past Surgical History:  Procedure Laterality Date  . [place] STUDY N/A 12/15/2015    Procedure: [place] STUDY;  Surgeon: Mauri Pole, MD;  Location: [facility];  Service: Endoscopy;  Laterality: N/A;  . DILATION AND CURETTAGE OF UTERUS    . ESOPHAGEAL MANOMETRY N/A 12/15/2015   Procedure: ESOPHAGEAL MANOMETRY (EM);  Surgeon: Mauri Pole, MD;  Location: [facility];  Service: Endoscopy;  Laterality: N/A;    Family History  Problem Relation Age of Onset  . Stroke Mother   . High blood pressure Mother   . High Cholesterol Mother   . Lung cancer Father   . Stroke Sister        16  . Thyroid disease Sister   . Diabetes Sister   . Hypertension Sister   . Hyperlipidemia Sister   . Hypertension Sister   . Sleep apnea Son   . Obesity Son     Social History   Socioeconomic History  . Marital status: Married    Spouse name: [name]  . Number of children: 2  . Years of education: 9  . Highest education level: Not on file  Occupational History  . 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 Financial resource strain: Not on file  . Food insecurity    Worry: Not on file    Inability: Not on file  . Transportation needs    Medical: Not on file    Non-medical: Not on file  Tobacco Use  . Smoking status: Never Smoker  . Smokeless tobacco: Never Used  Substance and Sexual Activity  . Alcohol use: Yes    Alcohol/week: 0.0 standard drinks    Comment: Rarely  . Drug use: No  . Sexual activity: Not Currently    Comment: no dietary restrictions, lives with husand, works at [employer]  . Physical activity    Days per week: Not on file    Minutes per session: Not on file  . Stress: Not on file  Relationships  . Social Herbalist on phone: Not on file    Gets together: Not on file    Attends religious service: Not on file    Active member of club or organization: Not on file    Attends meetings of clubs or organizations: Not on file    Relationship status: Not on file  . Intimate partner violence    Fear of  current or ex partner: Not on file    Emotionally abused: Not on file    Physically abused: Not on file    Forced sexual activity: Not on file  Other Topics Concern  . Not on file  Social History Narrative   Lives with husband   Caffeine- tea, 2 cups daily    Outpatient Medications Prior to Visit  Medication Sig Dispense Refill  . aspirin 81 MG chewable tablet Chew by mouth daily.    . fish oil-omega-3 fatty acids 1000 MG capsule Take 2 g by mouth daily.    . metoprolol succinate (TOPROL-XL) 50 MG 24 hr tablet Take 1 tablet (50 mg total) by mouth daily. Take with or immediately following a meal. 90 tablet 1  . Multiple Vitamin (MULTIVITAMIN WITH MINERALS) TABS tablet Take 1 tablet by mouth daily.    Marland Kitchen omeprazole (PRILOSEC) 40 MG capsule TAKE 1 CAPSULE (40 MG TOTAL) BY MOUTH 2 (TWO) TIMES DAILY BEFORE A MEAL. 60 capsule 11  . rosuvastatin (CRESTOR) 10 MG tablet Take 1 tablet (10 mg total) by mouth daily. 30 tablet 6  . tiZANidine (ZANAFLEX) 2 MG tablet Take 1-2 tablets (2-4 mg total) by mouth 2 (two) times daily as needed for muscle spasms. 30 tablet 1  . VITAMIN D PO Take 2,500 Units by mouth daily.    . Zinc 50 MG TABS Take 1 tablet by mouth daily.     No facility-administered medications prior to visit.     No Known Allergies  Review of Systems  Constitutional: Positive for malaise/fatigue. Negative for fever.  HENT: Positive for congestion. Negative for ear pain, hearing loss and sore throat.   Eyes: Negative for blurred vision.  Respiratory: Negative for shortness of breath.   Cardiovascular: Negative for chest pain, palpitations and leg swelling.  Gastrointestinal: Negative for abdominal pain, blood in stool and nausea.  Genitourinary: Negative for dysuria and frequency.  Musculoskeletal: Negative for falls.  Skin: Negative for rash.  Neurological: Positive for headaches. Negative for dizziness and loss of consciousness.  Endo/Heme/Allergies: Negative for environmental  allergies.  Psychiatric/Behavioral: Negative for depression. The patient is not nervous/anxious.        Objective:    Physical Exam Constitutional:      Appearance: She is well-developed. She is not ill-appearing.  HENT:     Head: Normocephalic and atraumatic.  Pulmonary:     Effort: Pulmonary effort is normal.  Neurological:     Mental Status: She is alert.     Cranial Nerves: No cranial nerve deficit.  Psychiatric:        Mood and Affect: Mood normal.  Behavior: Behavior normal.     BP 122/61   Pulse 73   Ht 5\' 2"  (1.575 m)   Wt 176 lb (79.8 kg)   BMI 32.19 kg/m  Wt Readings from Last 3 Encounters:  12/30/18 176 lb (79.8 kg)  11/11/18 177 lb 9.6 oz (80.6 kg)  11/01/18 174 lb (78.9 kg)    Diabetic Foot Exam - Simple   No data filed     Lab Results  Component Value Date   WBC 5.8 10/09/2018   HGB 13.3 10/09/2018   HCT 39.9 10/09/2018   PLT 207.0 10/09/2018   GLUCOSE 94 10/09/2018   CHOL 158 10/09/2018   TRIG 130.0 10/09/2018   HDL 52.10 10/09/2018   LDLCALC 80 10/09/2018   ALT 26 10/09/2018   AST 21 10/09/2018   NA 137 10/09/2018   K 4.4 10/09/2018   CL 102 10/09/2018   CREATININE 0.66 10/09/2018   BUN 17 10/09/2018   CO2 30 10/09/2018   TSH 1.56 10/09/2018   HGBA1C 6.1 10/09/2018   MICROALBUR 3.06 (H) 05/16/2011    Lab Results  Component Value Date   TSH 1.56 10/09/2018   Lab Results  Component Value Date   WBC 5.8 10/09/2018   HGB 13.3 10/09/2018   HCT 39.9 10/09/2018   MCV 87.4 10/09/2018   PLT 207.0 10/09/2018   Lab Results  Component Value Date   NA 137 10/09/2018   K 4.4 10/09/2018   CO2 30 10/09/2018   GLUCOSE 94 10/09/2018   BUN 17 10/09/2018   CREATININE 0.66 10/09/2018   BILITOT 0.4 10/09/2018   ALKPHOS 74 10/09/2018   AST 21 10/09/2018   ALT 26 10/09/2018   PROT 6.6 10/09/2018   ALBUMIN 4.1 10/09/2018   CALCIUM 8.8 10/09/2018   ANIONGAP 6 11/27/2017   GFR 89.77 10/09/2018   Lab Results  Component Value Date    CHOL 158 10/09/2018   Lab Results  Component Value Date   HDL 52.10 10/09/2018   Lab Results  Component Value Date   LDLCALC 80 10/09/2018   Lab Results  Component Value Date   TRIG 130.0 10/09/2018   Lab Results  Component Value Date   CHOLHDL 3 10/09/2018   Lab Results  Component Value Date   HGBA1C 6.1 10/09/2018       Assessment & Plan:   Problem List Items Addressed This Visit    Sinusitis    Notes congestion and worsening sinus pressure and pain since starting CPAP will treat a sinusitis and if no improvement will need to discuss cpap options with pulmonology so she will wear it more and we will proceed with CT head to rule out pathology that might cause headaches.       Relevant Medications   cefdinir (OMNICEF) 300 MG capsule   Hyperglycemia     minimize simple carbs. Increase exercise as tolerated.      Essential hypertension    Well controlled, no changes to meds. Encouraged heart healthy diet such as the DASH diet and exercise as tolerated.       Obstructive sleep apnea syndrome    Now set up with CPAP machine but she feels she has a headache when she uses it. She is to reach out to pulmonology to discuss options.         I am having Mariabella Dinning start on cefdinir. I am also having her maintain her fish oil-omega-3 fatty acids, multivitamin with minerals, aspirin, omeprazole, VITAMIN D PO, tiZANidine, metoprolol succinate,  rosuvastatin, and Zinc.  Meds ordered this encounter  Medications  . cefdinir (OMNICEF) 300 MG capsule    Sig: Take 1 capsule (300 mg total) by mouth 2 (two) times daily for 10 days.    Dispense:  20 capsule    Refill:  0     I discussed the assessment and treatment plan with the patient. The patient was provided an opportunity to ask questions and all were answered. The patient agreed with the plan and demonstrated an understanding of the instructions.   The patient was advised to call back or seek an in-person evaluation  if the symptoms worsen or if the condition fails to improve as anticipated.  I provided 25 minutes of non-face-to-face time during this encounter.   Penni Homans, MD

## 2018-12-30 NOTE — Assessment & Plan Note (Signed)
Well controlled, no changes to meds. Encouraged heart healthy diet such as the DASH diet and exercise as tolerated.  °

## 2018-12-30 NOTE — Assessment & Plan Note (Signed)
Notes congestion and worsening sinus pressure and pain since starting CPAP will treat a sinusitis and if no improvement will need to discuss cpap options with pulmonology so she will wear it more and we will proceed with CT head to rule out pathology that might cause headaches.

## 2018-12-30 NOTE — Assessment & Plan Note (Signed)
minimize simple carbs. Increase exercise as tolerated.  

## 2018-12-30 NOTE — Assessment & Plan Note (Addendum)
Now set up with CPAP machine but she feels she has a headache when she uses it. She is to reach out to pulmonology to discuss options.

## 2019-02-10 ENCOUNTER — Other Ambulatory Visit: Payer: Self-pay

## 2019-02-10 ENCOUNTER — Ambulatory Visit (INDEPENDENT_AMBULATORY_CARE_PROVIDER_SITE_OTHER): Payer: Managed Care, Other (non HMO) | Admitting: Family Medicine

## 2019-02-10 VITALS — BP 130/60 | HR 59 | Wt 178.0 lb

## 2019-02-10 DIAGNOSIS — R739 Hyperglycemia, unspecified: Secondary | ICD-10-CM | POA: Diagnosis not present

## 2019-02-10 DIAGNOSIS — E559 Vitamin D deficiency, unspecified: Secondary | ICD-10-CM

## 2019-02-10 DIAGNOSIS — I10 Essential (primary) hypertension: Secondary | ICD-10-CM | POA: Diagnosis not present

## 2019-02-10 DIAGNOSIS — E782 Mixed hyperlipidemia: Secondary | ICD-10-CM

## 2019-02-10 MED ORDER — METOPROLOL SUCCINATE ER 50 MG PO TB24: 50.0000 mg | ORAL_TABLET | Freq: Every day | ORAL | 1 refills | Status: DC

## 2019-02-10 MED ORDER — ROSUVASTATIN CALCIUM 10 MG PO TABS: 10.0000 mg | ORAL_TABLET | Freq: Every day | ORAL | 6 refills | Status: DC

## 2019-02-10 MED ORDER — TIZANIDINE HCL 2 MG PO TABS: 2.0000 mg | ORAL_TABLET | Freq: Two times a day (BID) | ORAL | 1 refills | Status: DC | PRN

## 2019-02-11 ENCOUNTER — Other Ambulatory Visit: Payer: Self-pay

## 2019-02-11 ENCOUNTER — Ambulatory Visit (INDEPENDENT_AMBULATORY_CARE_PROVIDER_SITE_OTHER): Payer: Managed Care, Other (non HMO) | Admitting: *Deleted

## 2019-02-11 ENCOUNTER — Other Ambulatory Visit (INDEPENDENT_AMBULATORY_CARE_PROVIDER_SITE_OTHER): Payer: Managed Care, Other (non HMO)

## 2019-02-11 DIAGNOSIS — R739 Hyperglycemia, unspecified: Secondary | ICD-10-CM

## 2019-02-11 DIAGNOSIS — Z23 Encounter for immunization: Secondary | ICD-10-CM | POA: Diagnosis not present

## 2019-02-11 DIAGNOSIS — E782 Mixed hyperlipidemia: Secondary | ICD-10-CM

## 2019-02-11 DIAGNOSIS — E559 Vitamin D deficiency, unspecified: Secondary | ICD-10-CM

## 2019-02-11 DIAGNOSIS — I10 Essential (primary) hypertension: Secondary | ICD-10-CM | POA: Diagnosis not present

## 2019-02-11 LAB — COMPREHENSIVE METABOLIC PANEL
ALT: 26 U/L (ref 0–35)
AST: 21 U/L (ref 0–37)
Albumin: 4.2 g/dL (ref 3.5–5.2)
Alkaline Phosphatase: 66 U/L (ref 39–117)
BUN: 14 mg/dL (ref 6–23)
CO2: 33 mEq/L — ABNORMAL HIGH (ref 19–32)
Calcium: 10 mg/dL (ref 8.4–10.5)
Chloride: 100 mEq/L (ref 96–112)
Creatinine, Ser: 0.67 mg/dL (ref 0.40–1.20)
GFR: 88.13 mL/min (ref >60.00)
Glucose, Bld: 94 mg/dL (ref 70–99)
Potassium: 4.2 mEq/L (ref 3.5–5.1)
Sodium: 139 mEq/L (ref 135–145)
Total Bilirubin: 0.5 mg/dL (ref 0.2–1.2)
Total Protein: 6.7 g/dL (ref 6.0–8.3)

## 2019-02-11 LAB — VITAMIN D 25 HYDROXY (VIT D DEFICIENCY, FRACTURES): VITD: 33.22 ng/mL (ref 30.00–100.00)

## 2019-02-11 LAB — CBC
HCT: 40.2 % (ref 36.0–46.0)
Hemoglobin: 13.2 g/dL (ref 12.0–15.0)
MCHC: 32.8 g/dL (ref 30.0–36.0)
MCV: 88.8 fl (ref 78.0–100.0)
Platelets: 218 10*3/uL (ref 150.0–400.0)
RBC: 4.52 Mil/uL (ref 3.87–5.11)
RDW: 13.7 % (ref 11.5–15.5)
WBC: 5.9 10*3/uL (ref 4.0–10.5)

## 2019-02-11 LAB — TSH: TSH: 1.7 u[IU]/mL (ref 0.35–4.50)

## 2019-02-11 LAB — LIPID PANEL
Cholesterol: 190 mg/dL (ref 0–200)
HDL: 59 mg/dL (ref >39.00)
LDL Cholesterol: 100 mg/dL — ABNORMAL HIGH (ref 0–99)
NonHDL: 130.8
Total CHOL/HDL Ratio: 3
Triglycerides: 154 mg/dL — ABNORMAL HIGH (ref 0.0–149.0)
VLDL: 30.8 mg/dL (ref 0.0–40.0)

## 2019-02-11 LAB — HEMOGLOBIN A1C: Hgb A1c MFr Bld: 6 % (ref 4.6–6.5)

## 2019-02-11 NOTE — Progress Notes (Signed)
Patient here for flu vaccine.  Vaccine given and patient tolerated well.

## 2019-02-13 NOTE — Assessment & Plan Note (Signed)
Supplement and monitor. She agrees to come in for lab work and flu shot

## 2019-02-13 NOTE — Assessment & Plan Note (Signed)
hgba1c acceptable, minimize simple carbs. Increase exercise as tolerated.  

## 2019-02-13 NOTE — Assessment & Plan Note (Signed)
Check vitals weekly, no changes to meds. Encouraged heart healthy diet such as the DASH diet and exercise as tolerated.  

## 2019-02-13 NOTE — Progress Notes (Signed)
Virtual Visit via phone Note  I connected with Margaret [name] on 02/10/19 at  3:20 PM EDT by a phone enabled telemedicine application and verified that I am speaking with the correct person using two identifiers.  Location: Patient: home Provider: office   I discussed the limitations of evaluation and management by telemedicine and the availability of in person appointments. The patient expressed understanding and agreed to proceed. Margaret [name], CMA was able to get patient set up on phne visit after being unable to set up video visit   Subjective:    Patient ID: Margaret [name], female    DOB: 1950/10/29, 68 y.o.   MRN: [id number]  No chief complaint on file.   HPI Patient is in today for follow up on chronic medical concerns including  Hypertension, hyperlipidemia, and hyperglycemia. She is doing well at staying home during pandemic. No recent febrile illness or hospitalizations. Is trying to maintain a heart healthy diet and stay active. Denies CP/palp/SOB/HA/congestion/fevers/GI or GU c/o. Taking meds as prescribed  Past Medical History:  Diagnosis Date  . Allergic state 03/25/2012  . Allergy   . Aortic calcification ([place]) 08/27/2014  . Atherosclerosis of aorta ([place]) 04/12/2014  . Back pain   . Constipation 01/23/2017  . GERD (gastroesophageal reflux disease)   . Hiatal hernia with gastroesophageal reflux 04/27/2010   Qualifier: Diagnosis of  By: Margaret [name] CMA, Darlene  Failed Omeprazole and Protonix   . Hx of adenomatous polyp of colon 01/23/2017   Colonoscopy 01/16/2017 with 2 polyps one was adenomatous. recommeded follow up in 5 years.performed by Dr Margaret [name]  . Hyperlipidemia   . Joint pain   . Osteopenia 08/31/2012  . Peripheral edema 12/13/2014  . Sleep apnea   . Unspecified sinusitis (chronic) 03/30/2013    Past Surgical History:  Procedure Laterality Date  . [place] STUDY N/A 12/15/2015   Procedure: [place] STUDY;  Surgeon: Margaret Pole, MD;  Location: [facility]  END[facility];  Service: Endoscopy;  Laterality: N/A;  . DILATION AND CURETTAGE OF UTERUS    . ESOPHAGEAL MANOMETRY N/A 12/15/2015   Procedure: ESOPHAGEAL MANOMETRY (EM);  Surgeon: Margaret Pole, MD;  Location: [facility];  Service: Endoscopy;  Laterality: N/A;    Family History  Problem Relation Age of Onset  . Stroke Mother   . High blood pressure Mother   . High Cholesterol Mother   . Lung cancer Father   . Stroke Sister        43  . Thyroid disease Sister   . Diabetes Sister   . Hypertension Sister   . Hyperlipidemia Sister   . Hypertension Sister   . Sleep apnea Son   . Obesity Son     Social History   Socioeconomic History  . Marital status: Married    Spouse name: Margaret [name]  . Number of children: 2  . Years of education: 31  . Highest education level: Not on file  Occupational History  . 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 Financial resource strain: Not on file  . Food insecurity    Worry: Not on file    Inability: Not on file  . Transportation needs    Medical: Not on file    Non-medical: Not on file  Tobacco Use  . Smoking status: Never Smoker  . Smokeless tobacco: Never Used  Substance and Sexual Activity  . Alcohol use: Yes    Alcohol/week: 0.0 standard drinks    Comment: Rarely  .  Drug use: No  . Sexual activity: Not Currently    Comment: no dietary restrictions, lives with husand, works at [employer]  . Physical activity    Days per week: Not on file    Minutes per session: Not on file  . Stress: Not on file  Relationships  . Social Herbalist on phone: Not on file    Gets together: Not on file    Attends religious service: Not on file    Active member of club or organization: Not on file    Attends meetings of clubs or organizations: Not on file    Relationship status: Not on file  . Intimate partner violence    Fear of current or ex partner: Not on file    Emotionally abused: Not on file     Physically abused: Not on file    Forced sexual activity: Not on file  Other Topics Concern  . Not on file  Social History Narrative   Lives with husband   Caffeine- tea, 2 cups daily    Outpatient Medications Prior to Visit  Medication Sig Dispense Refill  . aspirin 81 MG chewable tablet Chew by mouth daily.    . fish oil-omega-3 fatty acids 1000 MG capsule Take 2 g by mouth daily.    . Multiple Vitamin (MULTIVITAMIN WITH MINERALS) TABS tablet Take 1 tablet by mouth daily.    Marland Kitchen omeprazole (PRILOSEC) 40 MG capsule TAKE 1 CAPSULE (40 MG TOTAL) BY MOUTH 2 (TWO) TIMES DAILY BEFORE A MEAL. 60 capsule 11  . VITAMIN D PO Take 2,500 Units by mouth daily.    . Zinc 50 MG TABS Take 1 tablet by mouth daily.    . metoprolol succinate (TOPROL-XL) 50 MG 24 hr tablet Take 1 tablet (50 mg total) by mouth daily. Take with or immediately following a meal. 90 tablet 1  . rosuvastatin (CRESTOR) 10 MG tablet Take 1 tablet (10 mg total) by mouth daily. 30 tablet 6  . tiZANidine (ZANAFLEX) 2 MG tablet Take 1-2 tablets (2-4 mg total) by mouth 2 (two) times daily as needed for muscle spasms. 30 tablet 1   No facility-administered medications prior to visit.     No Known Allergies  Review of Systems  Constitutional: Negative for fever and malaise/fatigue.  HENT: Negative for congestion.   Eyes: Negative for blurred vision.  Respiratory: Negative for shortness of breath.   Cardiovascular: Negative for chest pain, palpitations and leg swelling.  Gastrointestinal: Negative for abdominal pain, blood in stool and nausea.  Genitourinary: Negative for dysuria and frequency.  Musculoskeletal: Negative for falls.  Skin: Negative for rash.  Neurological: Negative for dizziness, loss of consciousness and headaches.  Endo/Heme/Allergies: Negative for environmental allergies.  Psychiatric/Behavioral: Negative for depression. The patient is not nervous/anxious.        Objective:    Physical Exam unable to  obtain via phone visit.  BP 130/60 (BP Location: Left Arm, Patient Position: Sitting, Cuff Size: Normal)   Pulse (!) 59   Wt 178 lb (80.7 kg)   BMI 32.56 kg/m  Wt Readings from Last 3 Encounters:  02/10/19 178 lb (80.7 kg)  12/30/18 176 lb (79.8 kg)  11/11/18 177 lb 9.6 oz (80.6 kg)    Diabetic Foot Exam - Simple   No data filed     Lab Results  Component Value Date   WBC 5.9 02/11/2019   HGB 13.2 02/11/2019   HCT 40.2 02/11/2019   PLT 218.0  02/11/2019   GLUCOSE 94 02/11/2019   CHOL 190 02/11/2019   TRIG 154.0 (H) 02/11/2019   HDL 59.00 02/11/2019   LDLCALC 100 (H) 02/11/2019   ALT 26 02/11/2019   AST 21 02/11/2019   NA 139 02/11/2019   K 4.2 02/11/2019   CL 100 02/11/2019   CREATININE 0.67 02/11/2019   BUN 14 02/11/2019   CO2 33 (H) 02/11/2019   TSH 1.70 02/11/2019   HGBA1C 6.0 02/11/2019   MICROALBUR 3.06 (H) 05/16/2011    Lab Results  Component Value Date   TSH 1.70 02/11/2019   Lab Results  Component Value Date   WBC 5.9 02/11/2019   HGB 13.2 02/11/2019   HCT 40.2 02/11/2019   MCV 88.8 02/11/2019   PLT 218.0 02/11/2019   Lab Results  Component Value Date   NA 139 02/11/2019   K 4.2 02/11/2019   CO2 33 (H) 02/11/2019   GLUCOSE 94 02/11/2019   BUN 14 02/11/2019   CREATININE 0.67 02/11/2019   BILITOT 0.5 02/11/2019   ALKPHOS 66 02/11/2019   AST 21 02/11/2019   ALT 26 02/11/2019   PROT 6.7 02/11/2019   ALBUMIN 4.2 02/11/2019   CALCIUM 10.0 02/11/2019   ANIONGAP 6 11/27/2017   GFR 88.13 02/11/2019   Lab Results  Component Value Date   CHOL 190 02/11/2019   Lab Results  Component Value Date   HDL 59.00 02/11/2019   Lab Results  Component Value Date   LDLCALC 100 (H) 02/11/2019   Lab Results  Component Value Date   TRIG 154.0 (H) 02/11/2019   Lab Results  Component Value Date   CHOLHDL 3 02/11/2019   Lab Results  Component Value Date   HGBA1C 6.0 02/11/2019       Assessment & Plan:   Problem List Items Addressed This  Visit    Hyperlipidemia, mixed   Relevant Medications   metoprolol succinate (TOPROL-XL) 50 MG 24 hr tablet   rosuvastatin (CRESTOR) 10 MG tablet   Other Relevant Orders   Lipid panel (Completed)   Hyperglycemia    hgba1c acceptable, minimize simple carbs. Increase exercise as tolerated.       Relevant Orders   Hemoglobin A1c (Completed)   Essential hypertension - Primary    Check vitals weekly, no changes to meds. Encouraged heart healthy diet such as the DASH diet and exercise as tolerated.       Relevant Medications   metoprolol succinate (TOPROL-XL) 50 MG 24 hr tablet   rosuvastatin (CRESTOR) 10 MG tablet   Other Relevant Orders   CBC (Completed)   Comprehensive metabolic panel (Completed)   TSH (Completed)   Vitamin D deficiency    Supplement and monitor. She agrees to come in for lab work and flu shot      Relevant Orders   VITAMIN D 25 Hydroxy (Vit-D Deficiency, Fractures) (Completed)      I am having Bob Mohar maintain her fish oil-omega-3 fatty acids, multivitamin with minerals, aspirin, omeprazole, VITAMIN D PO, Zinc, metoprolol succinate, tiZANidine, and rosuvastatin.  Meds ordered this encounter  Medications  . metoprolol succinate (TOPROL-XL) 50 MG 24 hr tablet    Sig: Take 1 tablet (50 mg total) by mouth daily. Take with or immediately following a meal.    Dispense:  90 tablet    Refill:  1  . tiZANidine (ZANAFLEX) 2 MG tablet    Sig: Take 1-2 tablets (2-4 mg total) by mouth 2 (two) times daily as needed for muscle spasms.  Dispense:  30 tablet    Refill:  1  . rosuvastatin (CRESTOR) 10 MG tablet    Sig: Take 1 tablet (10 mg total) by mouth daily.    Dispense:  30 tablet    Refill:  6     I discussed the assessment and treatment plan with the patient. The patient was provided an opportunity to ask questions and all were answered. The patient agreed with the plan and demonstrated an understanding of the instructions.   The patient was advised  to call back or seek an in-person evaluation if the symptoms worsen or if the condition fails to improve as anticipated.  I provided 25 minutes of non-face-to-face time during this encounter.   Penni Homans, MD

## 2019-03-17 ENCOUNTER — Ambulatory Visit: Payer: Managed Care, Other (non HMO) | Admitting: Internal Medicine

## 2019-05-12 ENCOUNTER — Ambulatory Visit: Payer: Managed Care, Other (non HMO) | Admitting: Internal Medicine

## 2019-05-16 ENCOUNTER — Encounter: Payer: Self-pay | Admitting: Family Medicine

## 2019-05-16 ENCOUNTER — Ambulatory Visit (INDEPENDENT_AMBULATORY_CARE_PROVIDER_SITE_OTHER): Payer: Managed Care, Other (non HMO) | Admitting: Family Medicine

## 2019-05-16 ENCOUNTER — Other Ambulatory Visit: Payer: Self-pay

## 2019-05-16 DIAGNOSIS — R739 Hyperglycemia, unspecified: Secondary | ICD-10-CM | POA: Diagnosis not present

## 2019-05-16 DIAGNOSIS — G44209 Tension-type headache, unspecified, not intractable: Secondary | ICD-10-CM | POA: Diagnosis not present

## 2019-05-16 DIAGNOSIS — I10 Essential (primary) hypertension: Secondary | ICD-10-CM

## 2019-05-16 DIAGNOSIS — J329 Chronic sinusitis, unspecified: Secondary | ICD-10-CM

## 2019-05-16 DIAGNOSIS — E782 Mixed hyperlipidemia: Secondary | ICD-10-CM

## 2019-05-16 MED ORDER — LISINOPRIL 10 MG PO TABS: 10.0000 mg | ORAL_TABLET | Freq: Every day | ORAL | 3 refills | Status: DC

## 2019-05-16 MED ORDER — CEFDINIR 300 MG PO CAPS: 300.0000 mg | ORAL_CAPSULE | Freq: Two times a day (BID) | ORAL | 0 refills | Status: AC

## 2019-05-19 NOTE — Progress Notes (Addendum)
Virtual Visit via Video Note  I connected with Margaret [name] on 05/16/19 at  8:40 AM EST by a video enabled telemedicine application and verified that I am speaking with the correct person using two identifiers.  Location: Patient: home Provider: home   I discussed the limitations of evaluation and management by telemedicine and the availability of in person appointments. The patient expressed understanding and agreed to proceed.    Subjective:    Patient ID: Margaret [name], female    DOB: [birth year], 68 y.o.   MRN: [id number]  Chief Complaint  Patient presents with  . Headache    frequent headaches   . Hypertension    HPI Patient is in today for follow up on chronic medical concerns including hypertension, hyperlipidemia and more. No recent hospitalizations. She notes a recent fever, chills, myalgias, headaches, congestion. No recent cough or GI symptoms. Denies CP/palp/SOB/HA/congestion/fevers/GI or GU c/o. Taking meds as prescribed. She notes her BP has been better controlled recently but it did spike to 182 recently and at that point she had a headache  Past Medical History:  Diagnosis Date  . Allergic state 03/25/2012  . Allergy   . Aortic calcification ([place]) 08/27/2014  . Atherosclerosis of aorta ([place]) 04/12/2014  . Back pain   . Constipation 01/23/2017  . GERD (gastroesophageal reflux disease)   . Hiatal hernia with gastroesophageal reflux 04/27/2010   Qualifier: Diagnosis of  By: Danelle Earthly CMA, Darlene  Failed Omeprazole and Protonix   . Hx of adenomatous polyp of colon 01/23/2017   Colonoscopy 01/16/2017 with 2 polyps one was adenomatous. recommeded follow up in 5 years.performed by Dr Silverio Decamp  . Hyperlipidemia   . Joint pain   . Osteopenia 08/31/2012  . Peripheral edema 12/13/2014  . Sleep apnea   . Unspecified sinusitis (chronic) 03/30/2013    Past Surgical History:  Procedure Laterality Date  . [place] STUDY N/A 12/15/2015   Procedure: [place] STUDY;   Surgeon: Mauri Pole, MD;  Location: [facility];  Service: Endoscopy;  Laterality: N/A;  . DILATION AND CURETTAGE OF UTERUS    . ESOPHAGEAL MANOMETRY N/A 12/15/2015   Procedure: ESOPHAGEAL MANOMETRY (EM);  Surgeon: Mauri Pole, MD;  Location: [facility];  Service: Endoscopy;  Laterality: N/A;    Family History  Problem Relation Age of Onset  . Stroke Mother   . High blood pressure Mother   . High Cholesterol Mother   . Lung cancer Father   . Stroke Sister        22  . Thyroid disease Sister   . Diabetes Sister   . Hypertension Sister   . Hyperlipidemia Sister   . Hypertension Sister   . Sleep apnea Son   . Obesity Son     Social History   Socioeconomic History  . Marital status: Married    Spouse name: [name]  . Number of children: 2  . Years of education: 25  . Highest education level: Not on file  Occupational History  . Occupation: Optometrist    Comment: retired  Tobacco Use  . Smoking status: Never Smoker  . Smokeless tobacco: Never Used  Substance and Sexual Activity  . Alcohol use: Yes    Alcohol/week: 0.0 standard drinks    Comment: Rarely  . Drug use: No  . Sexual activity: Not Currently    Comment: no dietary restrictions, lives with husand, works at [employer]  Other Topics Concern  . Not on file  Social History Narrative  Lives with husband   Caffeine- tea, 2 cups daily   Social Determinants of Health   Financial Resource Strain:   . Difficulty of Paying Living Expenses: Not on file  Food Insecurity:   . Worried About Charity fundraiser in the Last Year: Not on file  . Ran Out of Food in the Last Year: Not on file  Transportation Needs:   . Lack of Transportation (Medical): Not on file  . Lack of Transportation (Non-Medical): Not on file  Physical Activity:   . Days of Exercise per Week: Not on file  . Minutes of Exercise per Session: Not on file  Stress:   . Feeling of Stress : Not on file  Social Connections:   . Frequency of  Communication with Friends and Family: Not on file  . Frequency of Social Gatherings with Friends and Family: Not on file  . Attends Religious Services: Not on file  . Active Member of Clubs or Organizations: Not on file  . Attends Archivist Meetings: Not on file  . Marital Status: Not on file  Intimate Partner Violence:   . Fear of Current or Ex-Partner: Not on file  . Emotionally Abused: Not on file  . Physically Abused: Not on file  . Sexually Abused: Not on file    Outpatient Medications Prior to Visit  Medication Sig Dispense Refill  . aspirin 81 MG chewable tablet Chew by mouth daily.    . fish oil-omega-3 fatty acids 1000 MG capsule Take 2 g by mouth daily.    . metoprolol succinate (TOPROL-XL) 50 MG 24 hr tablet Take 1 tablet (50 mg total) by mouth daily. Take with or immediately following a meal. 90 tablet 1  . Multiple Vitamin (MULTIVITAMIN WITH MINERALS) TABS tablet Take 1 tablet by mouth daily.    Marland Kitchen omeprazole (PRILOSEC) 40 MG capsule TAKE 1 CAPSULE (40 MG TOTAL) BY MOUTH 2 (TWO) TIMES DAILY BEFORE A MEAL. 60 capsule 11  . rosuvastatin (CRESTOR) 10 MG tablet Take 1 tablet (10 mg total) by mouth daily. 30 tablet 6  . VITAMIN D PO Take 2,500 Units by mouth daily.    . Zinc 50 MG TABS Take 1 tablet by mouth daily.    Marland Kitchen tiZANidine (ZANAFLEX) 2 MG tablet Take 1-2 tablets (2-4 mg total) by mouth 2 (two) times daily as needed for muscle spasms. 30 tablet 1   No facility-administered medications prior to visit.    No Known Allergies  Review of Systems  Constitutional: Positive for chills, fever and malaise/fatigue.  HENT: Positive for congestion.   Eyes: Negative for blurred vision.  Respiratory: Negative for shortness of breath.   Cardiovascular: Negative for chest pain, palpitations and leg swelling.  Gastrointestinal: Negative for abdominal pain, blood in stool and nausea.  Genitourinary: Negative for dysuria and frequency.  Musculoskeletal: Positive for  myalgias. Negative for falls.  Skin: Negative for rash.  Neurological: Positive for headaches. Negative for dizziness and loss of consciousness.  Endo/Heme/Allergies: Negative for environmental allergies.  Psychiatric/Behavioral: Negative for depression. The patient is not nervous/anxious.        Objective:    Physical Exam Constitutional:      Appearance: She is well-developed. She is not ill-appearing.  HENT:     Head: Normocephalic and atraumatic.  Eyes:     General: No scleral icterus.    Pupils: Pupils are equal.  Pulmonary:     Effort: Pulmonary effort is normal.  Neurological:     Mental Status:  She is alert and oriented to person, place, and time.     Cranial Nerves: No cranial nerve deficit.  Psychiatric:        Mood and Affect: Mood normal.        Behavior: Behavior normal.     BP 132/86   Pulse 65   Temp (!) 97.2 F (36.2 C) (Oral)   Ht 5\' 2"  (1.575 m)   Wt 179 lb (81.2 kg)   BMI 32.74 kg/m  Wt Readings from Last 3 Encounters:  05/16/19 179 lb (81.2 kg)  02/10/19 178 lb (80.7 kg)  12/30/18 176 lb (79.8 kg)    Diabetic Foot Exam - Simple   No data filed     Lab Results  Component Value Date   WBC 5.9 02/11/2019   HGB 13.2 02/11/2019   HCT 40.2 02/11/2019   PLT 218.0 02/11/2019   GLUCOSE 94 02/11/2019   CHOL 190 02/11/2019   TRIG 154.0 (H) 02/11/2019   HDL 59.00 02/11/2019   LDLCALC 100 (H) 02/11/2019   ALT 26 02/11/2019   AST 21 02/11/2019   NA 139 02/11/2019   K 4.2 02/11/2019   CL 100 02/11/2019   CREATININE 0.67 02/11/2019   BUN 14 02/11/2019   CO2 33 (H) 02/11/2019   TSH 1.70 02/11/2019   HGBA1C 6.0 02/11/2019   MICROALBUR 3.06 (H) 05/16/2011    Lab Results  Component Value Date   TSH 1.70 02/11/2019   Lab Results  Component Value Date   WBC 5.9 02/11/2019   HGB 13.2 02/11/2019   HCT 40.2 02/11/2019   MCV 88.8 02/11/2019   PLT 218.0 02/11/2019   Lab Results  Component Value Date   NA 139 02/11/2019   K 4.2 02/11/2019     CO2 33 (H) 02/11/2019   GLUCOSE 94 02/11/2019   BUN 14 02/11/2019   CREATININE 0.67 02/11/2019   BILITOT 0.5 02/11/2019   ALKPHOS 66 02/11/2019   AST 21 02/11/2019   ALT 26 02/11/2019   PROT 6.7 02/11/2019   ALBUMIN 4.2 02/11/2019   CALCIUM 10.0 02/11/2019   ANIONGAP 6 11/27/2017   GFR 88.13 02/11/2019   Lab Results  Component Value Date   CHOL 190 02/11/2019   Lab Results  Component Value Date   HDL 59.00 02/11/2019   Lab Results  Component Value Date   LDLCALC 100 (H) 02/11/2019   Lab Results  Component Value Date   TRIG 154.0 (H) 02/11/2019   Lab Results  Component Value Date   CHOLHDL 3 02/11/2019   Lab Results  Component Value Date   HGBA1C 6.0 02/11/2019       Assessment & Plan:   Problem List Items Addressed This Visit    Hyperlipidemia, mixed    Encouraged heart healthy diet, increase exercise, avoid trans fats, consider a krill oil cap daily      Relevant Medications   lisinopril (ZESTRIL) 10 MG tablet   Sinusitis    She has been suffering with congestion, fevers, chills, malaise, and myalgias. Start Mucinex, Cefdinir and probiotics.       Relevant Medications   cefdinir (OMNICEF) 300 MG capsule   Hyperglycemia    hgba1c acceptable, minimize simple carbs. Increase exercise as tolerated.       Essential hypertension    She reports it has been runnign better recently. No changes today, she will monitor and report any concerns.       Relevant Medications   lisinopril (ZESTRIL) 10 MG tablet   Headache  Notes when her pressure is up her head can hurt. Is feeling well presently.          I have discontinued Shamirah Zelaya's tiZANidine. I am also having her start on cefdinir and lisinopril. Additionally, I am having her maintain her fish oil-omega-3 fatty acids, multivitamin with minerals, aspirin, omeprazole, VITAMIN D PO, Zinc, metoprolol succinate, and rosuvastatin.  Meds ordered this encounter  Medications  . cefdinir (OMNICEF)  300 MG capsule    Sig: Take 1 capsule (300 mg total) by mouth 2 (two) times daily for 10 days.    Dispense:  20 capsule    Refill:  0  . lisinopril (ZESTRIL) 10 MG tablet    Sig: Take 1 tablet (10 mg total) by mouth daily.    Dispense:  90 tablet    Refill:  3      I discussed the assessment and treatment plan with the patient. The patient was provided an opportunity to ask questions and all were answered. The patient agreed with the plan and demonstrated an understanding of the instructions.   The patient was advised to call back or seek an in-person evaluation if the symptoms worsen or if the condition fails to improve as anticipated.  I provided 25 minutes of non-face-to-face time during this encounter.   Penni Homans, MD

## 2019-05-19 NOTE — Assessment & Plan Note (Signed)
Encouraged heart healthy diet, increase exercise, avoid trans fats, consider a krill oil cap daily 

## 2019-05-19 NOTE — Assessment & Plan Note (Signed)
She has been suffering with congestion, fevers, chills, malaise, and myalgias. Start Mucinex, Cefdinir and probiotics.

## 2019-05-19 NOTE — Assessment & Plan Note (Signed)
hgba1c acceptable, minimize simple carbs. Increase exercise as tolerated.  

## 2019-05-19 NOTE — Assessment & Plan Note (Signed)
She reports it has been runnign better recently. No changes today, she will monitor and report any concerns.

## 2019-05-19 NOTE — Assessment & Plan Note (Signed)
Notes when her pressure is up her head can hurt. Is feeling well presently.

## 2019-05-29 ENCOUNTER — Ambulatory Visit (INDEPENDENT_AMBULATORY_CARE_PROVIDER_SITE_OTHER): Payer: Managed Care, Other (non HMO) | Admitting: Family Medicine

## 2019-05-29 ENCOUNTER — Other Ambulatory Visit: Payer: Self-pay

## 2019-05-29 DIAGNOSIS — E559 Vitamin D deficiency, unspecified: Secondary | ICD-10-CM | POA: Diagnosis not present

## 2019-05-29 DIAGNOSIS — R739 Hyperglycemia, unspecified: Secondary | ICD-10-CM | POA: Diagnosis not present

## 2019-05-29 DIAGNOSIS — G44209 Tension-type headache, unspecified, not intractable: Secondary | ICD-10-CM

## 2019-05-29 DIAGNOSIS — I10 Essential (primary) hypertension: Secondary | ICD-10-CM

## 2019-05-29 DIAGNOSIS — G4700 Insomnia, unspecified: Secondary | ICD-10-CM | POA: Insufficient documentation

## 2019-05-29 NOTE — Assessment & Plan Note (Signed)
hgba1c acceptable, minimize simple carbs. Increase exercise as tolerated.  

## 2019-05-29 NOTE — Assessment & Plan Note (Signed)
Supplement and monitor 

## 2019-05-29 NOTE — Assessment & Plan Note (Addendum)
Well controlled, no changes to meds. Encouraged heart healthy diet such as the DASH diet and exercise as tolerated. mostof her systolic numbers are in the 120s and 130s but is up in the 140s today. Has not started the LIsinopril as her numbers were better. She is not sleeping well. Try melatonin to get sleep before starting Lisinopril at this time.

## 2019-05-29 NOTE — Assessment & Plan Note (Signed)
Still struggles with daily am headaches. She is doing better with water intake but she is noting eye strain on computer and reading but she also notes poor sleep and wakes frequently. She has an appointment with pulmonology next month to discuss. She also notes some neck stiffness and pain in am which contributes to headahces. Encouraged Encouraged moist heat and gentle stretching as tolerated. May try NSAIDs and prescription meds as directed and report if symptoms worsen or seek immediate care.

## 2019-05-29 NOTE — Assessment & Plan Note (Signed)
Encouraged good sleep hygiene such as dark, quiet room. No blue/green glowing lights such as computer screens in bedroom. No alcohol or stimulants in evening. Cut down on caffeine as able. Regular exercise is helpful but not just prior to bed time. Melatonin 1-5 mg qhs can go up to 10

## 2019-06-01 NOTE — Progress Notes (Signed)
Virtual Visit via Video Note  I connected with Margaret [name] on 05/29/19 at  9:20 AM EST by a video enabled telemedicine application and verified that I am speaking with the correct person using two identifiers.  Location: Patient: home Provider: home   I discussed the limitations of evaluation and management by telemedicine and the availability of in person appointments. The patient expressed understanding and agreed to proceed. Margaret [name], CMA was able to get the patient set up on a visit, video   Subjective:    Patient ID: Margaret [name], female    DOB: 11/26/1950, 69 y.o.   MRN: [id number]  No chief complaint on file.   HPI Patient is in today for follow up on chronic medical concerns including hypertension, hyperlipidemia, and more. She notes her bp has mostly been improved. systolics [id number] to 0000000 over 60s and 70s. Definitely higher when she is stressed. She needs cataract surgery soon. No recent febrile illness or hospitalizatins. No polyura or polydipsia. Denies CP/palp/SOB/HA/congestion/fevers/GI or GU c/o. Taking meds as prescribed  Past Medical History:  Diagnosis Date  . Allergic state 03/25/2012  . Allergy   . Aortic calcification ([place]) 08/27/2014  . Atherosclerosis of aorta ([place]) 04/12/2014  . Back pain   . Constipation 01/23/2017  . GERD (gastroesophageal reflux disease)   . Hiatal hernia with gastroesophageal reflux 04/27/2010   Qualifier: Diagnosis of  By: Danelle Earthly CMA, Darlene  Failed Omeprazole and Protonix   . Hx of adenomatous polyp of colon 01/23/2017   Colonoscopy 01/16/2017 with 2 polyps one was adenomatous. recommeded follow up in 5 years.performed by Dr Silverio Decamp  . Hyperlipidemia   . Joint pain   . Osteopenia 08/31/2012  . Peripheral edema 12/13/2014  . Sleep apnea   . Unspecified sinusitis (chronic) 03/30/2013    Past Surgical History:  Procedure Laterality Date  . [place] STUDY N/A 12/15/2015   Procedure: [place] STUDY;  Surgeon: Mauri Pole, MD;  Location: [facility];  Service: Endoscopy;  Laterality: N/A;  . DILATION AND CURETTAGE OF UTERUS    . ESOPHAGEAL MANOMETRY N/A 12/15/2015   Procedure: ESOPHAGEAL MANOMETRY (EM);  Surgeon: Mauri Pole, MD;  Location: [facility];  Service: Endoscopy;  Laterality: N/A;    Family History  Problem Relation Age of Onset  . Stroke Mother   . High blood pressure Mother   . High Cholesterol Mother   . Lung cancer Father   . Stroke Sister        24  . Thyroid disease Sister   . Diabetes Sister   . Hypertension Sister   . Hyperlipidemia Sister   . Hypertension Sister   . Sleep apnea Son   . Obesity Son     Social History   Socioeconomic History  . Marital status: Married    Spouse name: [name]  . Number of children: 2  . Years of education: 47  . Highest education level: Not on file  Occupational History  . Occupation: Optometrist    Comment: retired  Tobacco Use  . Smoking status: Never Smoker  . Smokeless tobacco: Never Used  Substance and Sexual Activity  . Alcohol use: Yes    Alcohol/week: 0.0 standard drinks    Comment: Rarely  . Drug use: No  . Sexual activity: Not Currently    Comment: no dietary restrictions, lives with husand, works at [employer]  Other Topics Concern  . Not on file  Social History Narrative   Lives with husband  Caffeine- tea, 2 cups daily   Social Determinants of Health   Financial Resource Strain:   . Difficulty of Paying Living Expenses: Not on file  Food Insecurity:   . Worried About Charity fundraiser in the Last Year: Not on file  . Ran Out of Food in the Last Year: Not on file  Transportation Needs:   . Lack of Transportation (Medical): Not on file  . Lack of Transportation (Non-Medical): Not on file  Physical Activity:   . Days of Exercise per Week: Not on file  . Minutes of Exercise per Session: Not on file  Stress:   . Feeling of Stress : Not on file  Social Connections:   . Frequency of Communication with  Friends and Family: Not on file  . Frequency of Social Gatherings with Friends and Family: Not on file  . Attends Religious Services: Not on file  . Active Member of Clubs or Organizations: Not on file  . Attends Archivist Meetings: Not on file  . Marital Status: Not on file  Intimate Partner Violence:   . Fear of Current or Ex-Partner: Not on file  . Emotionally Abused: Not on file  . Physically Abused: Not on file  . Sexually Abused: Not on file    Outpatient Medications Prior to Visit  Medication Sig Dispense Refill  . aspirin 81 MG chewable tablet Chew by mouth daily.    . fish oil-omega-3 fatty acids 1000 MG capsule Take 2 g by mouth daily.    Marland Kitchen lisinopril (ZESTRIL) 10 MG tablet Take 1 tablet (10 mg total) by mouth daily. 90 tablet 3  . metoprolol succinate (TOPROL-XL) 50 MG 24 hr tablet Take 1 tablet (50 mg total) by mouth daily. Take with or immediately following a meal. 90 tablet 1  . Multiple Vitamin (MULTIVITAMIN WITH MINERALS) TABS tablet Take 1 tablet by mouth daily.    Marland Kitchen omeprazole (PRILOSEC) 40 MG capsule TAKE 1 CAPSULE (40 MG TOTAL) BY MOUTH 2 (TWO) TIMES DAILY BEFORE A MEAL. 60 capsule 11  . rosuvastatin (CRESTOR) 10 MG tablet Take 1 tablet (10 mg total) by mouth daily. 30 tablet 6  . VITAMIN D PO Take 2,500 Units by mouth daily.    . Zinc 50 MG TABS Take 1 tablet by mouth daily.     No facility-administered medications prior to visit.    No Known Allergies  Review of Systems  Constitutional: Negative for fever and malaise/fatigue.  HENT: Negative for congestion.   Eyes: Negative for blurred vision.  Respiratory: Negative for shortness of breath.   Cardiovascular: Negative for chest pain, palpitations and leg swelling.  Gastrointestinal: Negative for abdominal pain, blood in stool and nausea.  Genitourinary: Negative for dysuria and frequency.  Musculoskeletal: Negative for falls.  Skin: Negative for rash.  Neurological: Positive for headaches.  Negative for dizziness and loss of consciousness.  Endo/Heme/Allergies: Negative for environmental allergies.  Psychiatric/Behavioral: Negative for depression. The patient has insomnia. The patient is not nervous/anxious.        Objective:    Physical Exam Constitutional:      Appearance: Normal appearance. She is not ill-appearing.  HENT:     Head: Normocephalic and atraumatic.     Right Ear: External ear normal.     Left Ear: External ear normal.     Nose: Nose normal.  Pulmonary:     Effort: Pulmonary effort is normal.  Neurological:     Mental Status: She is alert and oriented to  person, place, and time.  Psychiatric:        Behavior: Behavior normal.     BP (!) 148/68 (BP Location: Left Arm, Patient Position: Sitting, Cuff Size: Normal)   Temp 97.8 F (36.6 C) (Oral)   Wt 179 lb (81.2 kg)   SpO2 99%   BMI 32.74 kg/m  Wt Readings from Last 3 Encounters:  05/29/19 179 lb (81.2 kg)  05/16/19 179 lb (81.2 kg)  02/10/19 178 lb (80.7 kg)    Diabetic Foot Exam - Simple   No data filed     Lab Results  Component Value Date   WBC 5.9 02/11/2019   HGB 13.2 02/11/2019   HCT 40.2 02/11/2019   PLT 218.0 02/11/2019   GLUCOSE 94 02/11/2019   CHOL 190 02/11/2019   TRIG 154.0 (H) 02/11/2019   HDL 59.00 02/11/2019   LDLCALC 100 (H) 02/11/2019   ALT 26 02/11/2019   AST 21 02/11/2019   NA 139 02/11/2019   K 4.2 02/11/2019   CL 100 02/11/2019   CREATININE 0.67 02/11/2019   BUN 14 02/11/2019   CO2 33 (H) 02/11/2019   TSH 1.70 02/11/2019   HGBA1C 6.0 02/11/2019   MICROALBUR 3.06 (H) 05/16/2011    Lab Results  Component Value Date   TSH 1.70 02/11/2019   Lab Results  Component Value Date   WBC 5.9 02/11/2019   HGB 13.2 02/11/2019   HCT 40.2 02/11/2019   MCV 88.8 02/11/2019   PLT 218.0 02/11/2019   Lab Results  Component Value Date   NA 139 02/11/2019   K 4.2 02/11/2019   CO2 33 (H) 02/11/2019   GLUCOSE 94 02/11/2019   BUN 14 02/11/2019   CREATININE  0.67 02/11/2019   BILITOT 0.5 02/11/2019   ALKPHOS 66 02/11/2019   AST 21 02/11/2019   ALT 26 02/11/2019   PROT 6.7 02/11/2019   ALBUMIN 4.2 02/11/2019   CALCIUM 10.0 02/11/2019   ANIONGAP 6 11/27/2017   GFR 88.13 02/11/2019   Lab Results  Component Value Date   CHOL 190 02/11/2019   Lab Results  Component Value Date   HDL 59.00 02/11/2019   Lab Results  Component Value Date   LDLCALC 100 (H) 02/11/2019   Lab Results  Component Value Date   TRIG 154.0 (H) 02/11/2019   Lab Results  Component Value Date   CHOLHDL 3 02/11/2019   Lab Results  Component Value Date   HGBA1C 6.0 02/11/2019       Assessment & Plan:   Problem List Items Addressed This Visit    Hyperglycemia    hgba1c acceptable, minimize simple carbs. Increase exercise as tolerated      Essential hypertension    Well controlled, no changes to meds. Encouraged heart healthy diet such as the DASH diet and exercise as tolerated. mostof her systolic numbers are in the 120s and 130s but is up in the 140s today. Has not started the LIsinopril as her numbers were better. She is not sleeping well. Try melatonin to get sleep before starting Lisinopril at this time.      Vitamin D deficiency    Supplement and monitor      Headache    Still struggles with daily am headaches. She is doing better with water intake but she is noting eye strain on computer and reading but she also notes poor sleep and wakes frequently. She has an appointment with pulmonology next month to discuss. She also notes some neck stiffness and pain in  am which contributes to headahces. Encouraged Encouraged moist heat and gentle stretching as tolerated. May try NSAIDs and prescription meds as directed and report if symptoms worsen or seek immediate care.      Insomnia    Encouraged good sleep hygiene such as dark, quiet room. No blue/green glowing lights such as computer screens in bedroom. No alcohol or stimulants in evening. Cut down on  caffeine as able. Regular exercise is helpful but not just prior to bed time. Melatonin 1-5 mg qhs can go up to 10         I am having Itzy Fildes maintain her fish oil-omega-3 fatty acids, multivitamin with minerals, aspirin, omeprazole, VITAMIN D PO, Zinc, metoprolol succinate, rosuvastatin, and lisinopril.  No orders of the defined types were placed in this encounter.    I discussed the assessment and treatment plan with the patient. The patient was provided an opportunity to ask questions and all were answered. The patient agreed with the plan and demonstrated an understanding of the instructions.   The patient was advised to call back or seek an in-person evaluation if the symptoms worsen or if the condition fails to improve as anticipated.  I provided 25 minutes of non-face-to-face time during this encounter.   Penni Homans, MD

## 2019-06-02 ENCOUNTER — Ambulatory Visit: Payer: Self-pay | Admitting: *Deleted

## 2019-06-02 NOTE — Telephone Encounter (Signed)
I returned her call.  She is c/o being dizzy and weak form the lisinopril she started 4-5 days ago.  She is also c/o being very dry and having to drink water a lot.   She is also c/o a dry cough.   All of this started when she started the lisinopril 4-5 days ago.    She wants to know if there is something else she can take instead.  I called into Dr. Frederik Pear office and spoke with Mel Almond.   She is going to check with Dr. Charlett Blake and see if she needs to come into the office or if a video chat will be fine.   They are going to call Chrystel back after checking with Dr. Charlett Blake.  Pt was agreeable to this plan.  I sent my notes to Dr. Frederik Pear office.    Reason for Disposition . Taking a medicine that could cause dizziness (e.g., blood pressure medications, diuretics)    Lisinopril started 4-5 days ago.  Answer Assessment - Initial Assessment Questions 1. DESCRIPTION: "Describe your dizziness."     Dr. Charlett Blake prescribed me lisinopril.  I started it 4-5 days ago.   In the beginning I was dizzy and lightheadedness.    I'm so tired and I have a headache. 2. LIGHTHEADED: "Do you feel lightheaded?" (e.g., somewhat faint, woozy, weak upon standing)     Yes when I get up 3. VERTIGO: "Do you feel like either you or the room is spinning or tilting?" (i.e. vertigo)     No 4. SEVERITY: "How bad is it?"  "Do you feel like you are going to faint?" "Can you stand and walk?"   - MILD - walking normally   - MODERATE - interferes with normal activities (e.g., work, school)    - SEVERE - unable to stand, requires support to walk, feels like passing out now.      I feel like my muscles are weak since I started this medicine.    My mouth is very dry.   I think it's the lisinopril causing all of this. 5. ONSET:  "When did the dizziness begin?"     4-5 days ago.   6. AGGRAVATING FACTORS: "Does anything make it worse?" (e.g., standing, change in head position)     It's worse when I get up to go to the bathroom and late  in the evenings. 7. HEART RATE: "Can you tell me your heart rate?" "How many beats in 15 seconds?"  (Note: not all patients can do this)       Not asked 8. CAUSE: "What do you think is causing the dizziness?"     The lisinopril.   I'm also taking metoprolol.   My BP 149/71 this morning.   Pulse 67.     It stays around this range. 9. RECURRENT SYMPTOM: "Have you had dizziness before?" If so, ask: "When was the last time?" "What happened that time?"     I had problems with headaches.    10. OTHER SYMPTOMS: "Do you have any other symptoms?" (e.g., fever, chest pain, vomiting, diarrhea, bleeding)       I feel dry.   I have to drink a lot of water.   I feel weak too.  I have a dry cough that really bothers me.   11. PREGNANCY: "Is there any chance you are pregnant?" "When was your last menstrual period?"       N/A due to age  Protocols used: DIZZINESS [facility] [address]

## 2019-06-03 ENCOUNTER — Other Ambulatory Visit: Payer: Self-pay

## 2019-06-03 NOTE — Telephone Encounter (Signed)
Patient scheduled to see Percell Miller

## 2019-06-04 ENCOUNTER — Encounter: Payer: Self-pay | Admitting: Medical

## 2019-06-04 ENCOUNTER — Other Ambulatory Visit: Payer: Self-pay

## 2019-06-04 ENCOUNTER — Ambulatory Visit (INDEPENDENT_AMBULATORY_CARE_PROVIDER_SITE_OTHER): Payer: Managed Care, Other (non HMO) | Admitting: Medical

## 2019-06-04 VITALS — Temp 97.7°F | Resp 18 | Ht 62.0 in | Wt 177.6 lb

## 2019-06-04 DIAGNOSIS — I10 Essential (primary) hypertension: Secondary | ICD-10-CM

## 2019-06-04 DIAGNOSIS — R059 Cough, unspecified: Secondary | ICD-10-CM

## 2019-06-04 DIAGNOSIS — K219 Gastro-esophageal reflux disease without esophagitis: Secondary | ICD-10-CM | POA: Diagnosis not present

## 2019-06-04 DIAGNOSIS — H814 Vertigo of central origin: Secondary | ICD-10-CM | POA: Diagnosis not present

## 2019-06-04 DIAGNOSIS — R05 Cough: Secondary | ICD-10-CM

## 2019-06-04 DIAGNOSIS — R519 Headache, unspecified: Secondary | ICD-10-CM

## 2019-06-04 LAB — COMPREHENSIVE METABOLIC PANEL
ALT: 33 U/L (ref 0–35)
AST: 29 U/L (ref 0–37)
Albumin: 4.5 g/dL (ref 3.5–5.2)
Alkaline Phosphatase: 65 U/L (ref 39–117)
BUN: 12 mg/dL (ref 6–23)
CO2: 31 mEq/L (ref 19–32)
Calcium: 10 mg/dL (ref 8.4–10.5)
Chloride: 101 mEq/L (ref 96–112)
Creatinine, Ser: 0.66 mg/dL (ref 0.40–1.20)
GFR: 89.59 mL/min (ref >60.00)
Glucose, Bld: 104 mg/dL — ABNORMAL HIGH (ref 70–99)
Potassium: 4.1 mEq/L (ref 3.5–5.1)
Sodium: 137 mEq/L (ref 135–145)
Total Bilirubin: 0.5 mg/dL (ref 0.2–1.2)
Total Protein: 7.4 g/dL (ref 6.0–8.3)

## 2019-06-04 LAB — CBC WITH DIFFERENTIAL/PLATELET
Basophils Absolute: 0 10*3/uL (ref 0.0–0.1)
Basophils Relative: 0.7 % (ref 0.0–3.0)
Eosinophils Absolute: 0.3 10*3/uL (ref 0.0–0.7)
Eosinophils Relative: 6.6 % — ABNORMAL HIGH (ref 0.0–5.0)
HCT: 40.6 % (ref 36.0–46.0)
Hemoglobin: 13.5 g/dL (ref 12.0–15.0)
Lymphocytes Relative: 42.5 % (ref 12.0–46.0)
Lymphs Abs: 2.2 10*3/uL (ref 0.7–4.0)
MCHC: 33.2 g/dL (ref 30.0–36.0)
MCV: 88.8 fl (ref 78.0–100.0)
Monocytes Absolute: 0.5 10*3/uL (ref 0.1–1.0)
Monocytes Relative: 9.7 % (ref 3.0–12.0)
Neutro Abs: 2.1 10*3/uL (ref 1.4–7.7)
Neutrophils Relative %: 40.5 % — ABNORMAL LOW (ref 43.0–77.0)
Platelets: 220 10*3/uL (ref 150.0–400.0)
RBC: 4.57 Mil/uL (ref 3.87–5.11)
RDW: 13.7 % (ref 11.5–15.5)
WBC: 5.3 10*3/uL (ref 4.0–10.5)

## 2019-06-04 MED ORDER — FAMOTIDINE 20 MG PO TABS: 20.0000 mg | ORAL_TABLET | Freq: Two times a day (BID) | ORAL | 0 refills | Status: DC

## 2019-06-04 NOTE — Patient Instructions (Addendum)
For your recent episodes of dizziness, will go ahead and get CBC, CMP and CT of head without contrast.  Thanks CT of the head is a good idea as you mention Dr. Charlett Blake had been considering getting the rest and you do report history of headaches.  Recently differential diagnosis for dizziness could be lisinopril side effect.  You have already stopped that and you also noted recent dry cough.  Dry cough can also be side effect of lisinopril.  Your blood pressure readings have varied recently.  Initial readings here seemed on the low side with machine.  Then I checked twice and got borderline reading initially then better reading at 130/70.  This is with metoprolol only.  We need to get daily blood pressure readings over the next 2 days.  Check BP 3 consecutive times later today.  Make sure you are seated and relaxed and check blood pressure readings 10 minutes apart.  Do this again tomorrow.  Update me on a blood pressure reading via MyChart on Friday.  Might start you on low-dose losartan provided blood pressure is elevated.  You do report some history of reflux and belts on during exam.  Would recommend to get back on omeprazole.  If you still belching or having any reflux type symptoms then can add famotidine.  Reflux can be a cause of cough.  I do not think infectious cause of cough.  However if you do start to get any chest congestion type symptoms then let us know.  Follow-up in 10 days or as needed

## 2019-06-04 NOTE — Progress Notes (Signed)
Subjective:    Patient ID: Margaret [name], female    DOB: 19-Mar-1951, 69 y.o.   MRN: [id number]  HPI  Pt in for HA for a while per pt(pt state Dr. Charlett Blake aware and was considering getting ct of head for about 2 weeks). Pt states given antibiotic for sinus infection by Dr. Charlett Blake. Then pt states on follow up ha was present even after antibiotic. Last visit her bp was 148/68.   Pt thinks some of her HA may be related to looking at screen/smart phone. Pt also thinks related to sleeping only 6 hours. Also has sleep apnea. She has not been using that regularly. Will see pulmonologist in 2 weeks.   She has hx of htn. She has been on metoprolol in the past. Pt states lisinopril last Wednesday or Thursday. She stopped lisinopril last 2 days. She felt weak, light headed and began to have dry cough. Last tablet she took was 2 nights.  Pt has bp cuff at home. Pt bp at home was 129/71. This was only on metoprolol.   When pt was on both meds she states her bp was 140-149/70-75.  Pt machine is new machine.  Pt has hx of heart burn/gerd. She has some recently.  Pt has no sinus pressure. No chest congestion. No fever.     Review of Systems  Constitutional: Negative for chills, fatigue and fever.  HENT: Negative for congestion, ear pain and postnasal drip.   Respiratory: Positive for cough. Negative for chest tightness, shortness of breath and wheezing.   Cardiovascular: Negative for chest pain and palpitations.  Gastrointestinal: Positive for abdominal pain.  Musculoskeletal: Negative for back pain and neck pain.  Skin: Negative for rash.  Neurological: Positive for headaches.  Hematological: Negative for adenopathy. Does not bruise/bleed easily.  Psychiatric/Behavioral: Negative for behavioral problems, decreased concentration, sleep disturbance and suicidal ideas. The patient is not nervous/anxious.    Past Medical History:  Diagnosis Date  . Allergic state 03/25/2012  . Allergy   .  Aortic calcification ([place]) 08/27/2014  . Atherosclerosis of aorta ([place]) 04/12/2014  . Back pain   . Constipation 01/23/2017  . GERD (gastroesophageal reflux disease)   . Hiatal hernia with gastroesophageal reflux 04/27/2010   Qualifier: Diagnosis of  By: Danelle Earthly CMA, Darlene  Failed Omeprazole and Protonix   . Hx of adenomatous polyp of colon 01/23/2017   Colonoscopy 01/16/2017 with 2 polyps one was adenomatous. recommeded follow up in 5 years.performed by Dr Silverio Decamp  . Hyperlipidemia   . Joint pain   . Osteopenia 08/31/2012  . Peripheral edema 12/13/2014  . Sleep apnea   . Unspecified sinusitis (chronic) 03/30/2013     Social History   Socioeconomic History  . Marital status: Married    Spouse name: [name]  . Number of children: 2  . Years of education: 35  . Highest education level: Not on file  Occupational History  . Occupation: Optometrist    Comment: retired  Tobacco Use  . Smoking status: Never Smoker  . Smokeless tobacco: Never Used  Substance and Sexual Activity  . Alcohol use: Yes    Alcohol/week: 0.0 standard drinks    Comment: Rarely  . Drug use: No  . Sexual activity: Not Currently    Comment: no dietary restrictions, lives with husand, works at [employer]  Other Topics Concern  . Not on file  Social History Narrative   Lives with husband   Caffeine- tea, 2 cups daily   Social Determinants of Health  Financial Resource Strain:   . Difficulty of Paying Living Expenses: Not on file  Food Insecurity:   . Worried About Charity fundraiser in the Last Year: Not on file  . Ran Out of Food in the Last Year: Not on file  Transportation Needs:   . Lack of Transportation (Medical): Not on file  . Lack of Transportation (Non-Medical): Not on file  Physical Activity:   . Days of Exercise per Week: Not on file  . Minutes of Exercise per Session: Not on file  Stress:   . Feeling of Stress : Not on file  Social Connections:   . Frequency of Communication with Friends and  Family: Not on file  . Frequency of Social Gatherings with Friends and Family: Not on file  . Attends Religious Services: Not on file  . Active Member of Clubs or Organizations: Not on file  . Attends Archivist Meetings: Not on file  . Marital Status: Not on file  Intimate Partner Violence:   . Fear of Current or Ex-Partner: Not on file  . Emotionally Abused: Not on file  . Physically Abused: Not on file  . Sexually Abused: Not on file    Past Surgical History:  Procedure Laterality Date  . [place] STUDY N/A 12/15/2015   Procedure: [place] STUDY;  Surgeon: Mauri Pole, MD;  Location: [facility];  Service: Endoscopy;  Laterality: N/A;  . DILATION AND CURETTAGE OF UTERUS    . ESOPHAGEAL MANOMETRY N/A 12/15/2015   Procedure: ESOPHAGEAL MANOMETRY (EM);  Surgeon: Mauri Pole, MD;  Location: [facility];  Service: Endoscopy;  Laterality: N/A;    Family History  Problem Relation Age of Onset  . Stroke Mother   . High blood pressure Mother   . High Cholesterol Mother   . Lung cancer Father   . Stroke Sister        60  . Thyroid disease Sister   . Diabetes Sister   . Hypertension Sister   . Hyperlipidemia Sister   . Hypertension Sister   . Sleep apnea Son   . Obesity Son     No Known Allergies  Current Outpatient Medications on File Prior to Visit  Medication Sig Dispense Refill  . aspirin 81 MG chewable tablet Chew by mouth daily.    . fish oil-omega-3 fatty acids 1000 MG capsule Take 2 g by mouth daily.    Marland Kitchen lisinopril (ZESTRIL) 10 MG tablet Take 1 tablet (10 mg total) by mouth daily. 90 tablet 3  . metoprolol succinate (TOPROL-XL) 50 MG 24 hr tablet Take 1 tablet (50 mg total) by mouth daily. Take with or immediately following a meal. 90 tablet 1  . Multiple Vitamin (MULTIVITAMIN WITH MINERALS) TABS tablet Take 1 tablet by mouth daily.    Marland Kitchen omeprazole (PRILOSEC) 40 MG capsule TAKE 1 CAPSULE (40 MG TOTAL) BY MOUTH 2 (TWO) TIMES DAILY BEFORE A  MEAL. 60 capsule 11  . rosuvastatin (CRESTOR) 10 MG tablet Take 1 tablet (10 mg total) by mouth daily. 30 tablet 6  . VITAMIN D PO Take 2,500 Units by mouth daily.    . Zinc 50 MG TABS Take 1 tablet by mouth daily.     No current facility-administered medications on file prior to visit.    Temp 97.7 F (36.5 C) (Temporal)   Resp 18   Ht 5\' 2"  (1.575 m)   Wt 177 lb 9.6 oz (80.6 kg)   SpO2 96%  BMI 32.48 kg/m       Objective:   Physical Exam  General Mental Status- Alert. General Appearance- Not in acute distress.   Skin General: Color- Normal Color. Moisture- Normal Moisture.  Neck Carotid Arteries- Normal color. Moisture- Normal Moisture. No carotid bruits. No JVD.  Chest and Lung Exam Auscultation: Breath Sounds:-Normal.  Cardiovascular Auscultation:Rythm- Regular. Murmurs & Other Heart Sounds:Auscultation of the heart reveals- No Murmurs.  Abdomen Inspection:-Inspeection Normal. Palpation/Percussion:Note:No mass. Palpation and Percussion of the abdomen reveal- Non Tender, Non Distended + BS, no rebound or guarding.    Neurologic Cranial Nerve exam:- CN III-XII intact(No nystagmus), symmetric smile. Drift Test:- No drift. Finger to Nose:- Normal/Intact Strength:- 5/5 equal and symmetric strength both upper and lower extremities.      Assessment & Plan:  For your recent episodes of dizziness, will go ahead and get CBC, CMP and CT of head without contrast.  Thanks CT of the head is a good idea as you mention Dr. Charlett Blake had been considering getting the rest and you do report history of headaches.  Recently differential diagnosis for dizziness could be lisinopril side effect.  You have already stopped that and you also noted recent dry cough.  Dry cough can also be side effect of lisinopril.  Your blood pressure readings have varied recently.  Initial readings here seemed on the low side with machine.  Then I checked twice and got borderline reading initially  then better reading at 130/70.  This is with metoprolol only.  We need to get daily blood pressure readings over the next 2 days.  Check BP 3 consecutive times later today.  Make sure you are seated and relaxed and check blood pressure readings 10 minutes apart.  Do this again tomorrow.  Update me on a blood pressure reading via MyChart on Friday.  Might start you on low-dose losartan provided blood pressure is elevated.  You do report some history of reflux and belts on during exam.  Would recommend to get back on omeprazole.  If you still belching or having any reflux type symptoms then can add famotidine.  Reflux can be a cause of cough.  I do not think infectious cause of cough.  However if you do start to get any chest congestion type symptoms then let us know.  Follow-up in 10 days or as needed  40 minutes spent with pt. 50% of time spent counseling pt on plan going forward and answering pt questions.   Mackie Pai, PA-C

## 2019-06-09 ENCOUNTER — Ambulatory Visit (HOSPITAL_BASED_OUTPATIENT_CLINIC_OR_DEPARTMENT_OTHER)
Admission: RE | Admit: 2019-06-09 | Discharge: 2019-06-09 | Disposition: A | Discharge status: Home / Self Care | Payer: Managed Care, Other (non HMO) | Source: Ambulatory Visit | Attending: Medical | Admitting: Medical

## 2019-06-09 ENCOUNTER — Other Ambulatory Visit: Payer: Self-pay

## 2019-06-09 ENCOUNTER — Telehealth: Payer: Self-pay | Admitting: Family Medicine

## 2019-06-09 DIAGNOSIS — R519 Headache, unspecified: Secondary | ICD-10-CM | POA: Insufficient documentation

## 2019-06-09 DIAGNOSIS — H814 Vertigo of central origin: Secondary | ICD-10-CM | POA: Insufficient documentation

## 2019-06-09 NOTE — Telephone Encounter (Signed)
Copied from [place] [id number]. Topic: Appointment Scheduling - Scheduling Inquiry for Clinic >> Jun 09, 2019  3:55 PM Sheran Luz wrote: Patient calling to schedule follow up appointment with Dr. Charlett Blake. Patient was unsure when she needed to follow up. Unable to schedule patient because per patient, available appointments with Dr. Charlett Blake are too far out.

## 2019-06-10 NOTE — Telephone Encounter (Signed)
Patient was seen last week and will need to wait until we have the time to give her a call back with a time to her scheduled.

## 2019-06-17 ENCOUNTER — Other Ambulatory Visit: Payer: Self-pay

## 2019-06-17 ENCOUNTER — Ambulatory Visit (INDEPENDENT_AMBULATORY_CARE_PROVIDER_SITE_OTHER): Payer: Managed Care, Other (non HMO) | Admitting: Family Medicine

## 2019-06-17 ENCOUNTER — Encounter: Payer: Self-pay | Admitting: Family Medicine

## 2019-06-17 DIAGNOSIS — K449 Diaphragmatic hernia without obstruction or gangrene: Secondary | ICD-10-CM

## 2019-06-17 DIAGNOSIS — R059 Cough, unspecified: Secondary | ICD-10-CM

## 2019-06-17 DIAGNOSIS — K219 Gastro-esophageal reflux disease without esophagitis: Secondary | ICD-10-CM

## 2019-06-17 DIAGNOSIS — G4733 Obstructive sleep apnea (adult) (pediatric): Secondary | ICD-10-CM

## 2019-06-17 DIAGNOSIS — G44219 Episodic tension-type headache, not intractable: Secondary | ICD-10-CM

## 2019-06-17 DIAGNOSIS — R739 Hyperglycemia, unspecified: Secondary | ICD-10-CM | POA: Diagnosis not present

## 2019-06-17 DIAGNOSIS — E559 Vitamin D deficiency, unspecified: Secondary | ICD-10-CM

## 2019-06-17 DIAGNOSIS — I10 Essential (primary) hypertension: Secondary | ICD-10-CM

## 2019-06-17 DIAGNOSIS — R05 Cough: Secondary | ICD-10-CM

## 2019-06-17 MED ORDER — BENZONATATE 100 MG PO CAPS: 100.0000 mg | ORAL_CAPSULE | Freq: Two times a day (BID) | ORAL | 0 refills | Status: DC | PRN

## 2019-06-19 ENCOUNTER — Other Ambulatory Visit: Payer: Self-pay

## 2019-06-19 ENCOUNTER — Encounter: Payer: Self-pay | Admitting: Internal Medicine

## 2019-06-19 ENCOUNTER — Ambulatory Visit (INDEPENDENT_AMBULATORY_CARE_PROVIDER_SITE_OTHER): Payer: Managed Care, Other (non HMO) | Admitting: Internal Medicine

## 2019-06-19 VITALS — BP 126/72 | HR 54 | Temp 98.2°F | Ht 62.0 in | Wt 178.0 lb

## 2019-06-19 DIAGNOSIS — G4733 Obstructive sleep apnea (adult) (pediatric): Secondary | ICD-10-CM

## 2019-06-19 DIAGNOSIS — G44219 Episodic tension-type headache, not intractable: Secondary | ICD-10-CM

## 2019-06-19 NOTE — Patient Instructions (Signed)
Order- schedule HST   Off CPAP         Dx OSA  Please call us about 2 weeks after your sleep test to see if results and recommendations are ready yet. If appropriate, we may be able to look at alternative ways to treat sleep apnea.   Please call if we can help

## 2019-06-19 NOTE — Progress Notes (Signed)
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  Medical problem list includes HBP, CVA, ASCVD, HH/ GERD, Lipoma upper left back, Morbid Obesity, -----CPAP DME: [facility]. Patient reports that she does not use machine nightly due to drying her mouth out and causes a headache. We are unable to download her SD card at this visit. Machine is old. CPAP 5-20/ [facility] Feels her CPAP is not working right- morning headaches, dry mouth. Admits same symptoms without CPAP and admits she sleeps better/ better rested when she uses it.  Body weight today 177 lbs, Epworth score 11 Denies ENT surgery, heart or lung probs.   06/19/19- 89 yo Married F never smoker followed for OSA, complicated by HBP, CVA, ASCVD, HH/ GERD, Lipoma upper left back, Obesity, CPAP auto 5-20/ [facility]- replaced 2016 -----f/u OSA patient stated she has not been using CPAP machine nightly patient wakes up with a headache .  PCP has evaluated for HA Body weight today 178 lbs She is not clear that morning headache is better or worse with CPAP. Better than it used to be, but she is focused on this issue.  We discussed updating her sleep study, then possibly changing to oral appliance.   ROS-see HPI   + = positive Constitutional:    weight loss, night sweats, fevers, chills, fatigue, lassitude. HEENT:   + headaches, difficulty swallowing, tooth/dental problems, sore throat,       sneezing, itching, ear ache, nasal congestion, post nasal  drip, snoring CV:    chest pain, orthopnea, PND, swelling in lower extremities, anasarca,                                  dizziness, palpitations Resp:   shortness of breath with exertion or at rest.                productive cough,   non-productive cough, coughing up of blood.              change in color of mucus.  wheezing.   Skin:    rash or lesions. GI:  +heartburn, +indigestion, abdominal pain, nausea, vomiting, diarrhea,                 change in bowel habits, loss of appetite GU: dysuria, change in color of urine, no urgency or frequency.   flank pain. MS:   joint pain, stiffness, decreased range of motion, back pain. Neuro-     nothing unusual Psych:  change in mood or affect.  depression or anxiety.   memory loss.  OBJ- Physical Exam General- Alert, Oriented, Affect-appropriate, Distress- none acute Skin- rash-none, lesions- none, excoriation- none Lymphadenopathy- none Head- atraumatic            Eyes- Gross vision intact, PERRLA, conjunctivae and secretions clear            Ears- Hearing, canals-normal            Nose- Clear, no-Septal dev, mucus,  polyps, erosion, perforation             Throat- Mallampati IV , mucosa clear , drainage- none, tonsils- atrophic Neck- flexible , trachea midline, no stridor , thyroid nl, carotid no bruit Chest - symmetrical excursion , unlabored           Heart/CV- RRR , no murmur , no gallop  , no rub, nl s1 s2                           - JVD- none , edema- none, stasis changes- none, varices- none           Lung- clear to P&A, wheeze- none, cough- none , dullness-none, rub- none           Chest wall-  Abd-  Br/ Gen/ Rectal- Not done, not indicated Extrem- cyanosis- none, clubbing, none, atrophy- none, strength- nl Neuro- grossly intact to observation

## 2019-06-21 NOTE — Assessment & Plan Note (Signed)
She has not been compliant. If appropriate, she might do better with oral appliance. Plan- update sleep study

## 2019-06-21 NOTE — Assessment & Plan Note (Signed)
Chronic morning headaches, not intractable Plan- I don't think she is sure if HA has anything to do with CPAP. I suggested she talk with her PCP about referral to a headache specialist

## 2019-06-22 DIAGNOSIS — R059 Cough, unspecified: Secondary | ICD-10-CM | POA: Insufficient documentation

## 2019-06-22 DIAGNOSIS — R05 Cough: Secondary | ICD-10-CM | POA: Insufficient documentation

## 2019-06-22 NOTE — Assessment & Plan Note (Signed)
Systolic ranging from A999333. Diastolic in 0000000 and Q000111Q. Well controlled, no changes to meds. Encouraged heart healthy diet such as the DASH diet and exercise as tolerated.

## 2019-06-22 NOTE — Assessment & Plan Note (Signed)
hgba1c acceptable, minimize simple carbs. Increase exercise as tolerated.  

## 2019-06-22 NOTE — Assessment & Plan Note (Signed)
Supplement and monitor 

## 2019-06-22 NOTE — Assessment & Plan Note (Signed)
Avoid offending foods, start probiotics. Do not eat large meals in late evening and consider raising head of bed. Has noted some increased gaseousness this week. Worst qhs.

## 2019-06-22 NOTE — Assessment & Plan Note (Signed)
Likely multifactorial, no fevers or chills. Encouraged Famotidine, Zyrtec, minimize evening meals and spicy food. Given Tessalon Perles to use prn.

## 2019-06-22 NOTE — Progress Notes (Signed)
Virtual Visit via Video Note  I connected with Margaret [name] on 06/17/19 at  9:00 AM EST by a video enabled telemedicine application and verified that I am speaking with the correct person using two identifiers.  Location: Patient: home Provider: office   I discussed the limitations of evaluation and management by telemedicine and the availability of in person appointments. The patient expressed understanding and agreed to proceed. Margaret [name], CMA was able to get patient set up on visit, video   Subjective:    Patient ID: Margaret [name], female    DOB: 1951/05/03, 69 y.o.   MRN: [id number]  Chief Complaint  Patient presents with  . Follow-up    HPI Patient is in today for follow up on numerous concerns. Her blood pressure has been some better despite the fact she stopped her Lisinopril about a week ago. She was concerned it was contributing to a sense of weakness and light headedness. since stopping the lisinopril her symptoms have not gotten better. She also noted a sense of fatigue, myalgias, increased gas and GI upset all starting around the same time as her other symptoms worsened. Denies CP/palp/SOB/fevers or GU c/o. Taking meds as prescribed  Past Medical History:  Diagnosis Date  . Allergic state 03/25/2012  . Allergy   . Aortic calcification ([place]) 08/27/2014  . Atherosclerosis of aorta ([place]) 04/12/2014  . Back pain   . Constipation 01/23/2017  . GERD (gastroesophageal reflux disease)   . Hiatal hernia with gastroesophageal reflux 04/27/2010   Qualifier: Diagnosis of  By: Danelle Earthly CMA, Darlene  Failed Omeprazole and Protonix   . Hx of adenomatous polyp of colon 01/23/2017   Colonoscopy 01/16/2017 with 2 polyps one was adenomatous. recommeded follow up in 5 years.performed by Dr Silverio Decamp  . Hyperlipidemia   . Joint pain   . Osteopenia 08/31/2012  . Peripheral edema 12/13/2014  . Sleep apnea   . Unspecified sinusitis (chronic) 03/30/2013    Past Surgical History:    Procedure Laterality Date  . [place] STUDY N/A 12/15/2015   Procedure: [place] STUDY;  Surgeon: Mauri Pole, MD;  Location: [facility];  Service: Endoscopy;  Laterality: N/A;  . DILATION AND CURETTAGE OF UTERUS    . ESOPHAGEAL MANOMETRY N/A 12/15/2015   Procedure: ESOPHAGEAL MANOMETRY (EM);  Surgeon: Mauri Pole, MD;  Location: [facility];  Service: Endoscopy;  Laterality: N/A;    Family History  Problem Relation Age of Onset  . Stroke Mother   . High blood pressure Mother   . High Cholesterol Mother   . Lung cancer Father   . Stroke Sister        67  . Thyroid disease Sister   . Diabetes Sister   . Hypertension Sister   . Hyperlipidemia Sister   . Hypertension Sister   . Sleep apnea Son   . Obesity Son     Social History   Socioeconomic History  . Marital status: Married    Spouse name: Margaret [name]  . Number of children: 2  . Years of education: 28  . Highest education level: Not on file  Occupational History  . Occupation: Optometrist    Comment: retired  Tobacco Use  . Smoking status: Never Smoker  . Smokeless tobacco: Never Used  Substance and Sexual Activity  . Alcohol use: Yes    Alcohol/week: 0.0 standard drinks    Comment: Rarely  . Drug use: No  . Sexual activity: Not Currently    Comment: no dietary  restrictions, lives with husand, works at [employer]  Other Topics Concern  . Not on file  Social History Narrative   Lives with husband   Caffeine- tea, 2 cups daily   Social Determinants of Health   Financial Resource Strain:   . Difficulty of Paying Living Expenses: Not on file  Food Insecurity:   . Worried About Charity fundraiser in the Last Year: Not on file  . Ran Out of Food in the Last Year: Not on file  Transportation Needs:   . Lack of Transportation (Medical): Not on file  . Lack of Transportation (Non-Medical): Not on file  Physical Activity:   . Days of Exercise per Week: Not on file  . Minutes of Exercise per Session: Not  on file  Stress:   . Feeling of Stress : Not on file  Social Connections:   . Frequency of Communication with Friends and Family: Not on file  . Frequency of Social Gatherings with Friends and Family: Not on file  . Attends Religious Services: Not on file  . Active Member of Clubs or Organizations: Not on file  . Attends Archivist Meetings: Not on file  . Marital Status: Not on file  Intimate Partner Violence:   . Fear of Current or Ex-Partner: Not on file  . Emotionally Abused: Not on file  . Physically Abused: Not on file  . Sexually Abused: Not on file    Outpatient Medications Prior to Visit  Medication Sig Dispense Refill  . aspirin 81 MG chewable tablet Chew by mouth daily.    . famotidine (PEPCID) 20 MG tablet Take 1 tablet (20 mg total) by mouth 2 (two) times daily. 60 tablet 0  . fish oil-omega-3 fatty acids 1000 MG capsule Take 2 g by mouth daily.    Marland Kitchen lisinopril (ZESTRIL) 10 MG tablet Take 1 tablet (10 mg total) by mouth daily. 90 tablet 3  . metoprolol succinate (TOPROL-XL) 50 MG 24 hr tablet Take 1 tablet (50 mg total) by mouth daily. Take with or immediately following a meal. 90 tablet 1  . Multiple Vitamin (MULTIVITAMIN WITH MINERALS) TABS tablet Take 1 tablet by mouth daily.    Marland Kitchen omeprazole (PRILOSEC) 40 MG capsule TAKE 1 CAPSULE (40 MG TOTAL) BY MOUTH 2 (TWO) TIMES DAILY BEFORE A MEAL. 60 capsule 11  . rosuvastatin (CRESTOR) 10 MG tablet Take 1 tablet (10 mg total) by mouth daily. 30 tablet 6  . VITAMIN D PO Take 2,500 Units by mouth daily.    . Zinc 50 MG TABS Take 1 tablet by mouth daily.     No facility-administered medications prior to visit.    Allergies  Allergen Reactions  . Lisinopril Cough    Review of Systems  Constitutional: Positive for malaise/fatigue. Negative for fever.  HENT: Positive for congestion.   Eyes: Negative for blurred vision.  Respiratory: Positive for cough. Negative for shortness of breath.   Cardiovascular: Negative  for chest pain, palpitations and leg swelling.  Gastrointestinal: Positive for heartburn. Negative for abdominal pain, blood in stool and nausea.  Genitourinary: Negative for dysuria and frequency.  Musculoskeletal: Positive for myalgias. Negative for falls.  Skin: Negative for rash.  Neurological: Positive for dizziness and headaches. Negative for loss of consciousness.  Endo/Heme/Allergies: Negative for environmental allergies.  Psychiatric/Behavioral: Negative for depression. The patient is not nervous/anxious.        Objective:    Physical Exam Constitutional:      Appearance: Normal appearance. She is  not ill-appearing.  HENT:     Head: Normocephalic and atraumatic.     Right Ear: External ear normal.     Left Ear: External ear normal.     Nose: Nose normal.  Eyes:     General:        Right eye: No discharge.        Left eye: No discharge.  Pulmonary:     Effort: Pulmonary effort is normal.  Neurological:     Mental Status: She is alert and oriented to person, place, and time.  Psychiatric:        Behavior: Behavior normal.     Wt 177 lb (80.3 kg)   BMI 32.37 kg/m  Wt Readings from Last 3 Encounters:  06/19/19 178 lb (80.7 kg)  06/17/19 177 lb (80.3 kg)  06/04/19 177 lb 9.6 oz (80.6 kg)    Diabetic Foot Exam - Simple   No data filed     Lab Results  Component Value Date   WBC 5.3 06/04/2019   HGB 13.5 06/04/2019   HCT 40.6 06/04/2019   PLT 220.0 06/04/2019   GLUCOSE 104 (H) 06/04/2019   CHOL 190 02/11/2019   TRIG 154.0 (H) 02/11/2019   HDL 59.00 02/11/2019   LDLCALC 100 (H) 02/11/2019   ALT 33 06/04/2019   AST 29 06/04/2019   NA 137 06/04/2019   K 4.1 06/04/2019   CL 101 06/04/2019   CREATININE 0.66 06/04/2019   BUN 12 06/04/2019   CO2 31 06/04/2019   TSH 1.70 02/11/2019   HGBA1C 6.0 02/11/2019   MICROALBUR 3.06 (H) 05/16/2011    Lab Results  Component Value Date   TSH 1.70 02/11/2019   Lab Results  Component Value Date   WBC 5.3  06/04/2019   HGB 13.5 06/04/2019   HCT 40.6 06/04/2019   MCV 88.8 06/04/2019   PLT 220.0 06/04/2019   Lab Results  Component Value Date   NA 137 06/04/2019   K 4.1 06/04/2019   CO2 31 06/04/2019   GLUCOSE 104 (H) 06/04/2019   BUN 12 06/04/2019   CREATININE 0.66 06/04/2019   BILITOT 0.5 06/04/2019   ALKPHOS 65 06/04/2019   AST 29 06/04/2019   ALT 33 06/04/2019   PROT 7.4 06/04/2019   ALBUMIN 4.5 06/04/2019   CALCIUM 10.0 06/04/2019   ANIONGAP 6 11/27/2017   GFR 89.59 06/04/2019   Lab Results  Component Value Date   CHOL 190 02/11/2019   Lab Results  Component Value Date   HDL 59.00 02/11/2019   Lab Results  Component Value Date   LDLCALC 100 (H) 02/11/2019   Lab Results  Component Value Date   TRIG 154.0 (H) 02/11/2019   Lab Results  Component Value Date   CHOLHDL 3 02/11/2019   Lab Results  Component Value Date   HGBA1C 6.0 02/11/2019       Assessment & Plan:   Problem List Items Addressed This Visit    Hiatal hernia with gastroesophageal reflux    Avoid offending foods, start probiotics. Do not eat large meals in late evening and consider raising head of bed. Has noted some increased gaseousness this week. Worst qhs.       Hyperglycemia    hgba1c acceptable, minimize simple carbs. Increase exercise as tolerated.       Essential hypertension    Systolic ranging from A999333. Diastolic in 0000000 and Q000111Q. Well controlled, no changes to meds. Encouraged heart healthy diet such as the DASH diet and exercise as  tolerated.       Obstructive sleep apnea syndrome    Sees pulmonology this week to update POC. She has not been using Cpap anf has frequent HA      Vitamin D deficiency    Supplement and monitor      Headache    Encouraged increased hydration, 64 ounces of clear fluids daily. Minimize alcohol and caffeine. Eat small frequent meals with lean proteins and complex carbs. Avoid high and low blood sugars. Get adequate sleep, 7-8 hours a night. Needs  exercise daily preferably in the morning. Work up so far unremarkable and headaches tolerable.       Cough    Likely multifactorial, no fevers or chills. Encouraged Famotidine, Zyrtec, minimize evening meals and spicy food. Given Tessalon Perles to use prn.          I am having Gwendy Ferdig start on benzonatate. I am also having her maintain her fish oil-omega-3 fatty acids, multivitamin with minerals, aspirin, omeprazole, VITAMIN D PO, Zinc, metoprolol succinate, rosuvastatin, lisinopril, and famotidine.  Meds ordered this encounter  Medications  . benzonatate (TESSALON) 100 MG capsule    Sig: Take 1-2 capsules (100-200 mg total) by mouth 2 (two) times daily as needed for cough.    Dispense:  30 capsule    Refill:  0     I discussed the assessment and treatment plan with the patient. The patient was provided an opportunity to ask questions and all were answered. The patient agreed with the plan and demonstrated an understanding of the instructions.   The patient was advised to call back or seek an in-person evaluation if the symptoms worsen or if the condition fails to improve as anticipated.  I provided 25 minutes of non-face-to-face time during this encounter.   Penni Homans, MD

## 2019-06-22 NOTE — Assessment & Plan Note (Signed)
Sees pulmonology this week to update POC. She has not been using Cpap anf has frequent HA

## 2019-06-22 NOTE — Assessment & Plan Note (Signed)
Encouraged increased hydration, 64 ounces of clear fluids daily. Minimize alcohol and caffeine. Eat small frequent meals with lean proteins and complex carbs. Avoid high and low blood sugars. Get adequate sleep, 7-8 hours a night. Needs exercise daily preferably in the morning. Work up so far unremarkable and headaches tolerable.

## 2019-07-03 ENCOUNTER — Other Ambulatory Visit: Payer: Self-pay | Admitting: Medical

## 2019-07-19 ENCOUNTER — Ambulatory Visit: Payer: Managed Care, Other (non HMO) | Attending: Internal Medicine

## 2019-07-19 ENCOUNTER — Other Ambulatory Visit: Payer: Self-pay

## 2019-07-19 DIAGNOSIS — Z23 Encounter for immunization: Secondary | ICD-10-CM

## 2019-07-19 NOTE — Progress Notes (Signed)
   Covid-19 Vaccination Clinic  Name:  Margaret [name]    MRN: [id number] DOB: Sep 16, 1950  07/19/2019  [name] was observed post Covid-19 immunization for 15 minutes without incidence. She was provided with Vaccine Information Sheet and instruction to access the V-Safe system.   [name] was instructed to call 911 with any severe reactions post vaccine: Marland Kitchen Difficulty breathing  . Swelling of your face and throat  . A fast heartbeat  . A bad rash all over your body  . Dizziness and weakness    Immunizations Administered    Name Date Dose VIS Date Route   Pfizer COVID-19 Vaccine 07/19/2019  2:06 PM 0.3 mL 05/09/2019 Intramuscular   Manufacturer: [place]   Lot: [id number]   [place]: [id number]

## 2019-07-31 ENCOUNTER — Other Ambulatory Visit: Payer: Self-pay

## 2019-07-31 ENCOUNTER — Ambulatory Visit (INDEPENDENT_AMBULATORY_CARE_PROVIDER_SITE_OTHER): Payer: Managed Care, Other (non HMO) | Admitting: Family Medicine

## 2019-07-31 ENCOUNTER — Encounter: Payer: Self-pay | Admitting: Family Medicine

## 2019-07-31 DIAGNOSIS — R739 Hyperglycemia, unspecified: Secondary | ICD-10-CM

## 2019-07-31 DIAGNOSIS — E7849 Other hyperlipidemia: Secondary | ICD-10-CM | POA: Diagnosis not present

## 2019-07-31 DIAGNOSIS — M8580 Other specified disorders of bone density and structure, unspecified site: Secondary | ICD-10-CM

## 2019-07-31 DIAGNOSIS — E559 Vitamin D deficiency, unspecified: Secondary | ICD-10-CM

## 2019-07-31 DIAGNOSIS — E782 Mixed hyperlipidemia: Secondary | ICD-10-CM

## 2019-07-31 DIAGNOSIS — I10 Essential (primary) hypertension: Secondary | ICD-10-CM

## 2019-07-31 NOTE — Progress Notes (Signed)
Acute Office Visit  Subjective:    Patient ID: Margaret [name], adult    DOB: 25-Feb-1951, 69 y.o.   MRN: [id number]  Chief Complaint  Patient presents with  . Hypertension    follow up    HPI Patient is in today for follow up on chronic medical concerns. No recent febrile illness or hospitalizations. She has noted an improvement in her high blood pressure nubers and less feeling of dizziness. She is trying to stay active and eat a heart healthy diet. Denies CP/palp/SOB/HA/congestion/fevers/GI or GU c/o. Taking meds as prescribed  Past Medical History:  Diagnosis Date  . Allergic state 03/25/2012  . Allergy   . Aortic calcification ([place]) 08/27/2014  . Atherosclerosis of aorta ([place]) 04/12/2014  . Back pain   . Constipation 01/23/2017  . GERD (gastroesophageal reflux disease)   . Hiatal hernia with gastroesophageal reflux 04/27/2010   Qualifier: Diagnosis of  By: Danelle Earthly CMA, Darlene  Failed Omeprazole and Protonix   . Hx of adenomatous polyp of colon 01/23/2017   Colonoscopy 01/16/2017 with 2 polyps one was adenomatous. recommeded follow up in 5 years.performed by Dr Silverio Decamp  . Hyperlipidemia   . Joint pain   . Osteopenia 08/31/2012  . Peripheral edema 12/13/2014  . Sleep apnea   . Unspecified sinusitis (chronic) 03/30/2013    Past Surgical History:  Procedure Laterality Date  . [place] STUDY N/A 12/15/2015   Procedure: [place] STUDY;  Surgeon: Mauri Pole, MD;  Location: [facility];  Service: Endoscopy;  Laterality: N/A;  . DILATION AND CURETTAGE OF UTERUS    . ESOPHAGEAL MANOMETRY N/A 12/15/2015   Procedure: ESOPHAGEAL MANOMETRY (EM);  Surgeon: Mauri Pole, MD;  Location: [facility];  Service: Endoscopy;  Laterality: N/A;    Family History  Problem Relation Age of Onset  . Stroke Mother   . High blood pressure Mother   . High Cholesterol Mother   . Lung cancer Father   . Stroke Sister        53  . Thyroid disease Sister   . Diabetes Sister   .  Hypertension Sister   . Hyperlipidemia Sister   . Hypertension Sister   . Sleep apnea Son   . Obesity Son     Social History   Socioeconomic History  . Marital status: Married    Spouse name: [name]  . Number of children: 2  . Years of education: 37  . Highest education level: Not on file  Occupational History  . Occupation: Optometrist    Comment: retired  Tobacco Use  . Smoking status: Never Smoker  . Smokeless tobacco: Never Used  Substance and Sexual Activity  . Alcohol use: Yes    Alcohol/week: 0.0 standard drinks    Comment: Rarely  . Drug use: No  . Sexual activity: Not Currently    Comment: no dietary restrictions, lives with husand, works at [employer]  Other Topics Concern  . Not on file  Social History Narrative   Lives with husband   Caffeine- tea, 2 cups daily   Social Determinants of Health   Financial Resource Strain:   . Difficulty of Paying Living Expenses: Not on file  Food Insecurity:   . Worried About Charity fundraiser in the Last Year: Not on file  . Ran Out of Food in the Last Year: Not on file  Transportation Needs:   . Lack of Transportation (Medical): Not on file  . Lack of Transportation (Non-Medical): Not on  file  Physical Activity:   . Days of Exercise per Week: Not on file  . Minutes of Exercise per Session: Not on file  Stress:   . Feeling of Stress : Not on file  Social Connections:   . Frequency of Communication with Friends and Family: Not on file  . Frequency of Social Gatherings with Friends and Family: Not on file  . Attends Religious Services: Not on file  . Active Member of Clubs or Organizations: Not on file  . Attends Archivist Meetings: Not on file  . Marital Status: Not on file  Intimate Partner Violence:   . Fear of Current or Ex-Partner: Not on file  . Emotionally Abused: Not on file  . Physically Abused: Not on file  . Sexually Abused: Not on file    Outpatient Medications Prior to Visit  Medication Sig  Dispense Refill  . aspirin 81 MG chewable tablet Chew by mouth daily.    . famotidine (PEPCID) 20 MG tablet TAKE 1 TABLET BY MOUTH TWICE A DAY 180 tablet 1  . fish oil-omega-3 fatty acids 1000 MG capsule Take 2 g by mouth daily.    . metoprolol succinate (TOPROL-XL) 50 MG 24 hr tablet Take 1 tablet (50 mg total) by mouth daily. Take with or immediately following a meal. 90 tablet 1  . Multiple Vitamin (MULTIVITAMIN WITH MINERALS) TABS tablet Take 1 tablet by mouth daily.    Marland Kitchen omeprazole (PRILOSEC) 40 MG capsule TAKE 1 CAPSULE (40 MG TOTAL) BY MOUTH 2 (TWO) TIMES DAILY BEFORE A MEAL. 60 capsule 11  . rosuvastatin (CRESTOR) 10 MG tablet Take 1 tablet (10 mg total) by mouth daily. 30 tablet 6  . VITAMIN D PO Take 2,500 Units by mouth daily.    . Zinc 50 MG TABS Take 1 tablet by mouth daily.    . benzonatate (TESSALON) 100 MG capsule Take 1-2 capsules (100-200 mg total) by mouth 2 (two) times daily as needed for cough. 30 capsule 0  . lisinopril (ZESTRIL) 10 MG tablet Take 1 tablet (10 mg total) by mouth daily. 90 tablet 3   No facility-administered medications prior to visit.    Allergies  Allergen Reactions  . Lisinopril Cough    Review of Systems  Constitutional: Negative for fever.  HENT: Negative for congestion.   Respiratory: Negative for shortness of breath.   Cardiovascular: Negative for chest pain, palpitations and leg swelling.  Gastrointestinal: Negative for abdominal pain, blood in stool and nausea.  Genitourinary: Negative for dysuria and frequency.  Skin: Negative for rash.  Allergic/Immunologic: Negative for environmental allergies.  Neurological: Negative for dizziness and headaches.  Psychiatric/Behavioral: The patient is not nervous/anxious.        Objective:    Physical Exam Constitutional:      Appearance: Normal appearance. She is not ill-appearing.  HENT:     Head: Normocephalic and atraumatic.     Nose: Nose normal.  Eyes:     General:        Right eye:  No discharge.        Left eye: No discharge.  Pulmonary:     Effort: Pulmonary effort is normal.  Neurological:     Mental Status: She is alert and oriented to person, place, and time.  Psychiatric:        Behavior: Behavior normal.     BP 138/66   Pulse 62   Temp (!) 97.4 F (36.3 C)   Ht 5\' 2"  (1.575 m)  Wt 177 lb (80.3 kg)   BMI 32.37 kg/m  Wt Readings from Last 3 Encounters:  07/31/19 177 lb (80.3 kg)  06/19/19 178 lb (80.7 kg)  06/17/19 177 lb (80.3 kg)    Health Maintenance Due  Topic Date Due  . DEXA SCAN  05/13/2018  . PNA vac Low Risk Adult (2 of 2 - PPSV23) 05/13/2018    There are no preventive care reminders to display for this patient.   Lab Results  Component Value Date   TSH 1.70 02/11/2019   Lab Results  Component Value Date   WBC 5.3 06/04/2019   HGB 13.5 06/04/2019   HCT 40.6 06/04/2019   MCV 88.8 06/04/2019   PLT 220.0 06/04/2019   Lab Results  Component Value Date   NA 137 06/04/2019   K 4.1 06/04/2019   CO2 31 06/04/2019   GLUCOSE 104 (H) 06/04/2019   BUN 12 06/04/2019   CREATININE 0.66 06/04/2019   BILITOT 0.5 06/04/2019   ALKPHOS 65 06/04/2019   AST 29 06/04/2019   ALT 33 06/04/2019   PROT 7.4 06/04/2019   ALBUMIN 4.5 06/04/2019   CALCIUM 10.0 06/04/2019   ANIONGAP 6 11/27/2017   GFR 89.59 06/04/2019   Lab Results  Component Value Date   CHOL 190 02/11/2019   Lab Results  Component Value Date   HDL 59.00 02/11/2019   Lab Results  Component Value Date   LDLCALC 100 (H) 02/11/2019   Lab Results  Component Value Date   TRIG 154.0 (H) 02/11/2019   Lab Results  Component Value Date   CHOLHDL 3 02/11/2019   Lab Results  Component Value Date   HGBA1C 6.0 02/11/2019       Assessment & Plan:   Problem List Items Addressed This Visit    Hyperlipidemia, mixed    Encouraged heart healthy diet, increase exercise, avoid trans fats, consider a krill oil cap daily      Osteopenia    Encouraged to get adequate  exercise, calcium and vitamin d intake      Hyperglycemia    hgba1c acceptable, minimize simple carbs. Increase exercise as tolerated.       Essential hypertension    Well controlled, no changes to meds. Encouraged heart healthy diet such as the DASH diet and exercise as tolerated. Her numbers largely within normal limits now. She has a less frequent systolic number in the 0000000, no changes at this time.       RESOLVED: Other hyperlipidemia    Encouraged heart healthy diet, increase exercise, avoid trans fats, consider a krill oil cap daily      Vitamin D deficiency    Supplement and monitor          No orders of the defined types were placed in this encounter.    Penni Homans, MD

## 2019-08-01 NOTE — Assessment & Plan Note (Signed)
Encouraged heart healthy diet, increase exercise, avoid trans fats, consider a krill oil cap daily 

## 2019-08-01 NOTE — Assessment & Plan Note (Signed)
hgba1c acceptable, minimize simple carbs. Increase exercise as tolerated.  

## 2019-08-01 NOTE — Assessment & Plan Note (Signed)
Encouraged to get adequate exercise, calcium and vitamin d intake 

## 2019-08-01 NOTE — Assessment & Plan Note (Signed)
Well controlled, no changes to meds. Encouraged heart healthy diet such as the DASH diet and exercise as tolerated. Her numbers largely within normal limits now. She has a less frequent systolic number in the 0000000, no changes at this time.

## 2019-08-01 NOTE — Assessment & Plan Note (Signed)
Supplement and monitor 

## 2019-08-12 ENCOUNTER — Ambulatory Visit: Payer: Managed Care, Other (non HMO) | Attending: Internal Medicine

## 2019-08-12 DIAGNOSIS — Z23 Encounter for immunization: Secondary | ICD-10-CM

## 2019-08-12 NOTE — Progress Notes (Signed)
   Covid-19 Vaccination Clinic  Name:  Margaret [name]    MRN: [id number] DOB: 20-Nov-1950  08/12/2019  [name] was observed post Covid-19 immunization for 15 minutes without incident. She was provided with Vaccine Information Sheet and instruction to access the V-Safe system.   [name] was instructed to call 911 with any severe reactions post vaccine: Marland Kitchen Difficulty breathing  . Swelling of face and throat  . A fast heartbeat  . A bad rash all over body  . Dizziness and weakness   Immunizations Administered    Name Date Dose VIS Date Route   Pfizer COVID-19 Vaccine 08/12/2019  3:57 PM 0.3 mL 05/09/2019 Intramuscular   Manufacturer: [place]   Lot: [id number]   [place]: [id number]

## 2019-09-03 ENCOUNTER — Ambulatory Visit: Payer: Managed Care, Other (non HMO) | Admitting: Gastroenterology

## 2019-09-08 ENCOUNTER — Ambulatory Visit (INDEPENDENT_AMBULATORY_CARE_PROVIDER_SITE_OTHER): Payer: Managed Care, Other (non HMO) | Admitting: Gastroenterology

## 2019-09-08 ENCOUNTER — Encounter: Payer: Self-pay | Admitting: Gastroenterology

## 2019-09-08 VITALS — BP 126/64 | HR 74 | Temp 98.1°F | Ht 62.0 in | Wt 182.0 lb

## 2019-09-08 DIAGNOSIS — K588 Other irritable bowel syndrome: Secondary | ICD-10-CM

## 2019-09-08 DIAGNOSIS — R1013 Epigastric pain: Secondary | ICD-10-CM

## 2019-09-08 DIAGNOSIS — K219 Gastro-esophageal reflux disease without esophagitis: Secondary | ICD-10-CM

## 2019-09-08 DIAGNOSIS — K3184 Gastroparesis: Secondary | ICD-10-CM

## 2019-09-08 DIAGNOSIS — R140 Abdominal distension (gaseous): Secondary | ICD-10-CM

## 2019-09-08 DIAGNOSIS — R142 Eructation: Secondary | ICD-10-CM

## 2019-09-08 MED ORDER — SUCRALFATE 1 G PO TABS: 1.0000 g | ORAL_TABLET | Freq: Three times a day (TID) | ORAL | 1 refills | Status: DC

## 2019-09-08 NOTE — Patient Instructions (Signed)
We have sent the following medications to your pharmacy for you to pick up at your convenience: Carafate.   You can take over the counter FD Gard 1 capsule by mouth three times a day as needed. You can also take over the counter Gaviscon after meals as needed three times a day.   Follow up with Dr. Silverio Decamp in 3-4 months.

## 2019-09-08 NOTE — Progress Notes (Signed)
Margaret [name]    [id number]    1951/03/05  Primary Care Physician:Blyth, Bonnita Levan, MD  Referring Physician: Mosie Lukes, MD [address] [place],  [place] [address]   Chief complaint:  GERD, epigastric abd pain  HPI: 69 year old female with history of chronic GERD, gastroparesis and irritable bowel syndrome here for follow-up visit.  Complains of epigastric fullness and discomfort with excessive belching and bloating especially worse when she eats spicy or greasy foods.  Anytime she switches her diet she feels her symptoms recur She is currently taking Pepcid as needed.  Off PPI  Denies any  vomiting, melena or bright red blood per rectum  Colonoscopy January 16, 2017: 10 mm adenomatous polyp removed from rectum.  5 mm sigmoid polyp removed.  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  Outpatient Encounter Medications as of 09/08/2019  Medication Sig  . aspirin 81 MG chewable tablet Chew by mouth daily.  . famotidine (PEPCID) 20 MG tablet TAKE 1 TABLET BY MOUTH TWICE A DAY (Patient taking differently: Take 20 mg by mouth as needed. )  . fish oil-omega-3 fatty acids 1000 MG capsule Take 2 g by mouth daily.  . metoprolol succinate (TOPROL-XL) 50 MG 24 hr tablet Take 1 tablet (50 mg total) by mouth daily. Take with or immediately following a meal.  . Multiple Vitamin (MULTIVITAMIN WITH MINERALS) TABS tablet Take 1  tablet by mouth daily.  . rosuvastatin (CRESTOR) 10 MG tablet Take 1 tablet (10 mg total) by mouth daily.  Marland Kitchen VITAMIN D PO Take 2,500 Units by mouth daily.  . Zinc 50 MG TABS Take 1 tablet by mouth daily.  . [DISCONTINUED] omeprazole (PRILOSEC) 40 MG capsule TAKE 1 CAPSULE (40 MG TOTAL) BY MOUTH 2 (TWO) TIMES DAILY BEFORE A MEAL.  Marland Kitchen sucralfate (CARAFATE) 1 g tablet Take 1 tablet (1 g total) by mouth 4 (four) times daily -  with meals and at bedtime.   No facility-administered encounter medications on file as of 09/08/2019.    Allergies as of 09/08/2019 - Review Complete 07/31/2019  Allergen Reaction Noted  . Lisinopril Cough 06/17/2019    Past Medical History:  Diagnosis Date  . Allergic state 03/25/2012  . Allergy   . Aortic calcification ([place]) 08/27/2014  . Atherosclerosis of aorta ([place]) 04/12/2014  . Back pain   . Constipation 01/23/2017  . GERD (gastroesophageal reflux disease)   . Hiatal hernia with gastroesophageal reflux 04/27/2010   Qualifier: Diagnosis of  By: Danelle Earthly CMA, Darlene  Failed Omeprazole and Protonix   . Hx of adenomatous polyp of colon 01/23/2017   Colonoscopy 01/16/2017 with 2 polyps one was adenomatous. recommeded follow up in 5 years.performed by Dr Silverio Decamp  . Hyperlipidemia   . Joint pain   . Osteopenia 08/31/2012  . Peripheral edema 12/13/2014  . Sleep apnea   . Unspecified sinusitis (chronic) 03/30/2013    Past  Surgical History:  Procedure Laterality Date  . [place] STUDY N/A 12/15/2015   Procedure: [place] STUDY;  Surgeon: Mauri Pole, MD;  Location: [facility];  Service: Endoscopy;  Laterality: N/A;  . DILATION AND CURETTAGE OF UTERUS    . ESOPHAGEAL MANOMETRY N/A 12/15/2015   Procedure: ESOPHAGEAL MANOMETRY (EM);  Surgeon: Mauri Pole, MD;  Location: [facility];  Service: Endoscopy;  Laterality: N/A;    Family History  Problem Relation Age of Onset  . Stroke Mother   . High blood pressure Mother   . High Cholesterol Mother    . Lung cancer Father   . Stroke Sister        80  . Thyroid disease Sister   . Diabetes Sister   . Hypertension Sister   . Hyperlipidemia Sister   . Hypertension Sister   . Sleep apnea Son   . Obesity Son     Social History   Socioeconomic History  . Marital status: Married    Spouse name: [name]  . Number of children: 2  . Years of education: 75  . Highest education level: Not on file  Occupational History  . Occupation: Optometrist    Comment: retired  Tobacco Use  . Smoking status: Never Smoker  . Smokeless tobacco: Never Used  Substance and Sexual Activity  . Alcohol use: Yes    Alcohol/week: 0.0 standard drinks    Comment: Rarely  . Drug use: No  . Sexual activity: Not Currently    Comment: no dietary restrictions, lives with husand, works at [employer]  Other Topics Concern  . Not on file  Social History Narrative   Lives with husband   Caffeine- tea, 2 cups daily   Social Determinants of Health   Financial Resource Strain:   . Difficulty of Paying Living Expenses:   Food Insecurity:   . Worried About Charity fundraiser in the Last Year:   . Arboriculturist in the Last Year:   Transportation Needs:   . Film/video editor (Medical):   Marland Kitchen Lack of Transportation (Non-Medical):   Physical Activity:   . Days of Exercise per Week:   . Minutes of Exercise per Session:   Stress:   . Feeling of Stress :   Social Connections:   . Frequency of Communication with Friends and Family:   . Frequency of Social Gatherings with Friends and Family:   . Attends Religious Services:   . Active Member of Clubs or Organizations:   . Attends Archivist Meetings:   Marland Kitchen Marital Status:   Intimate Partner Violence:   . Fear of Current or Ex-Partner:   . Emotionally Abused:   Marland Kitchen Physically Abused:   . Sexually Abused:       Review of systems:  All other review of systems negative except as mentioned in the HPI.   Physical Exam: Vitals:   09/08/19 1447  BP:  126/64  Pulse: 74  Temp: 98.1 F (36.7 C)   Body mass index is 33.29 kg/m. Gen:      No acute distress Abd:      soft, non-tender; no palpable masses, no distension Neuro: alert and oriented x 3 Psych: normal mood and affect  Data Reviewed:  Reviewed labs, radiology imaging, old records and pertinent past GI work up   Assessment and Plan/Recommendations:  69 year old female with history of chronic GERD, gastritis, gastroparesis and chronic irritable bowel syndrome  GERD: Off PPI, patient is  worried about potential long-term side effects/adverse reaction with chronic PPI use  Currently using Pepcid as needed with frequent breakthrough symptoms We will do trial of Carafate with meals and at bedtime as needed.  Also advised patient that she can use Gaviscon after meals as needed if has persistent heartburn or reflux related symptoms Continue antireflux measures and lifestyle modifications  Gastroparesis: Continue with dietary modification Avoid high-fat diet  Trial of FD guard 1 capsule up to 3 times daily as needed for dyspepsia  If continues to have persistent abdominal discomfort and GERD related symptoms, will consider repeat EGD for evaluation to exclude erosive esophagitis, gastritis or peptic ulcer disease  History of adenomatous colon polyps, due for surveillance colonoscopy August 2021  Return in 3 months or sooner if needed  This visit required 35 minutes of patient care (this includes precharting, chart review, review of results, face-to-face time used for counseling as well as treatment plan and follow-up. The patient was provided an opportunity to ask questions and all were answered. The patient agreed with the plan and demonstrated an understanding of the instructions.  Damaris Hippo , MD    CC: Mosie Lukes, MD

## 2019-09-10 ENCOUNTER — Encounter: Payer: Self-pay | Admitting: Gastroenterology

## 2019-09-26 ENCOUNTER — Encounter: Payer: Self-pay | Admitting: Family Medicine

## 2019-09-27 ENCOUNTER — Other Ambulatory Visit: Payer: Self-pay | Admitting: Family Medicine

## 2019-09-27 MED ORDER — CHLORTHALIDONE 25 MG PO TABS: 25.0000 mg | ORAL_TABLET | Freq: Every day | ORAL | 2 refills | Status: DC

## 2019-10-09 ENCOUNTER — Other Ambulatory Visit: Payer: Self-pay | Admitting: Family Medicine

## 2019-10-09 ENCOUNTER — Encounter: Payer: Self-pay | Admitting: Family Medicine

## 2019-10-09 MED ORDER — AMLODIPINE BESYLATE 5 MG PO TABS: 5.0000 mg | ORAL_TABLET | Freq: Every day | ORAL | 2 refills | Status: DC

## 2019-11-05 ENCOUNTER — Other Ambulatory Visit: Payer: Self-pay | Admitting: Gastroenterology

## 2019-11-10 ENCOUNTER — Telehealth: Payer: Self-pay

## 2019-11-10 NOTE — Telephone Encounter (Signed)
Patient called in to speak with the nurse about getting on the schedule for some blood work due to the medication that she is on. Per the patient  Was told to call back and schedule if she has not heard anything in 2 weeks. Please follow up with the patient at [phone]

## 2019-11-11 ENCOUNTER — Other Ambulatory Visit: Payer: Self-pay | Admitting: Family Medicine

## 2019-11-11 DIAGNOSIS — R739 Hyperglycemia, unspecified: Secondary | ICD-10-CM

## 2019-11-11 DIAGNOSIS — I10 Essential (primary) hypertension: Secondary | ICD-10-CM

## 2019-11-11 DIAGNOSIS — E559 Vitamin D deficiency, unspecified: Secondary | ICD-10-CM

## 2019-11-11 DIAGNOSIS — E782 Mixed hyperlipidemia: Secondary | ICD-10-CM

## 2019-11-11 NOTE — Telephone Encounter (Signed)
I ordered labs  -

## 2019-11-11 NOTE — Telephone Encounter (Signed)
It looks like we did not get her set up for bp check.  Did you want any blood work?  It was stated in the [place] conversation.

## 2019-11-12 NOTE — Telephone Encounter (Signed)
Patient has been scheduled

## 2019-11-13 ENCOUNTER — Ambulatory Visit: Payer: Managed Care, Other (non HMO)

## 2019-11-13 ENCOUNTER — Other Ambulatory Visit (INDEPENDENT_AMBULATORY_CARE_PROVIDER_SITE_OTHER): Payer: Managed Care, Other (non HMO)

## 2019-11-13 ENCOUNTER — Other Ambulatory Visit: Payer: Self-pay

## 2019-11-13 DIAGNOSIS — E782 Mixed hyperlipidemia: Secondary | ICD-10-CM | POA: Diagnosis not present

## 2019-11-13 DIAGNOSIS — E559 Vitamin D deficiency, unspecified: Secondary | ICD-10-CM

## 2019-11-13 DIAGNOSIS — I10 Essential (primary) hypertension: Secondary | ICD-10-CM | POA: Diagnosis not present

## 2019-11-13 DIAGNOSIS — R739 Hyperglycemia, unspecified: Secondary | ICD-10-CM

## 2019-11-13 LAB — HEMOGLOBIN A1C: Hgb A1c MFr Bld: 6.1 % (ref 4.6–6.5)

## 2019-11-13 LAB — CBC
HCT: 38.7 % (ref 36.0–46.0)
Hemoglobin: 13.2 g/dL (ref 12.0–15.0)
MCHC: 34.1 g/dL (ref 30.0–36.0)
MCV: 87.2 fl (ref 78.0–100.0)
Platelets: 240 10*3/uL (ref 150.0–400.0)
RBC: 4.44 Mil/uL (ref 3.87–5.11)
RDW: 13.7 % (ref 11.5–15.5)
WBC: 6.3 10*3/uL (ref 4.0–10.5)

## 2019-11-13 LAB — LIPID PANEL
Cholesterol: 165 mg/dL (ref 0–200)
HDL: 55.4 mg/dL (ref >39.00)
LDL Cholesterol: 93 mg/dL (ref 0–99)
NonHDL: 109.37
Total CHOL/HDL Ratio: 3
Triglycerides: 81 mg/dL (ref 0.0–149.0)
VLDL: 16.2 mg/dL (ref 0.0–40.0)

## 2019-11-13 LAB — COMPREHENSIVE METABOLIC PANEL
ALT: 37 U/L — ABNORMAL HIGH (ref 0–35)
AST: 28 U/L (ref 0–37)
Albumin: 4.4 g/dL (ref 3.5–5.2)
Alkaline Phosphatase: 74 U/L (ref 39–117)
BUN: 17 mg/dL (ref 6–23)
CO2: 34 mEq/L — ABNORMAL HIGH (ref 19–32)
Calcium: 9.6 mg/dL (ref 8.4–10.5)
Chloride: 96 mEq/L (ref 96–112)
Creatinine, Ser: 0.66 mg/dL (ref 0.40–1.20)
GFR: 89.47 mL/min (ref >60.00)
Glucose, Bld: 109 mg/dL — ABNORMAL HIGH (ref 70–99)
Potassium: 3.2 mEq/L — ABNORMAL LOW (ref 3.5–5.1)
Sodium: 137 mEq/L (ref 135–145)
Total Bilirubin: 0.5 mg/dL (ref 0.2–1.2)
Total Protein: 7 g/dL (ref 6.0–8.3)

## 2019-11-13 LAB — VITAMIN D 25 HYDROXY (VIT D DEFICIENCY, FRACTURES): VITD: 37.54 ng/mL (ref 30.00–100.00)

## 2019-11-13 LAB — TSH: TSH: 1.42 u[IU]/mL (ref 0.35–4.50)

## 2019-11-13 NOTE — Progress Notes (Signed)
BP Readings from Last 3 Encounters:  09/08/19 126/64  07/31/19 138/66  06/19/19 126/72   Patient here for bp check. She is on Metoprolol 50 mg and Amlodipine 5 mg.  Blood pressure today is 124 /49 With a pulse of 64. Per Dr. Charlett Blake ok to continue the same medications and to schedule a follow up for sometime in July or August.

## 2019-11-14 ENCOUNTER — Other Ambulatory Visit: Payer: Self-pay | Admitting: *Deleted

## 2019-11-14 DIAGNOSIS — E876 Hypokalemia: Secondary | ICD-10-CM

## 2019-11-14 MED ORDER — POTASSIUM CHLORIDE CRYS ER 20 MEQ PO TBCR: 20.0000 meq | EXTENDED_RELEASE_TABLET | Freq: Two times a day (BID) | ORAL | 2 refills | Status: DC

## 2019-11-26 ENCOUNTER — Other Ambulatory Visit: Payer: Managed Care, Other (non HMO)

## 2019-12-02 ENCOUNTER — Other Ambulatory Visit: Payer: Managed Care, Other (non HMO)

## 2019-12-09 ENCOUNTER — Other Ambulatory Visit: Payer: Self-pay | Admitting: *Deleted

## 2019-12-09 ENCOUNTER — Telehealth: Payer: Self-pay | Admitting: *Deleted

## 2019-12-09 DIAGNOSIS — E876 Hypokalemia: Secondary | ICD-10-CM

## 2019-12-09 NOTE — Telephone Encounter (Signed)
Looks like patient cancelled her appt on 6/30 and the lab expired on 7/1.  I have put order back in.

## 2019-12-09 NOTE — Telephone Encounter (Signed)
Pt has lab appointment for tomorrow morning but no lab orders in Epic.  Please place orders if appropriate.

## 2019-12-10 ENCOUNTER — Other Ambulatory Visit: Payer: Self-pay

## 2019-12-10 ENCOUNTER — Encounter: Payer: Self-pay | Admitting: Family Medicine

## 2019-12-10 ENCOUNTER — Other Ambulatory Visit (INDEPENDENT_AMBULATORY_CARE_PROVIDER_SITE_OTHER): Payer: Managed Care, Other (non HMO)

## 2019-12-10 DIAGNOSIS — E876 Hypokalemia: Secondary | ICD-10-CM

## 2019-12-10 LAB — COMPREHENSIVE METABOLIC PANEL
ALT: 48 U/L — ABNORMAL HIGH (ref 0–35)
AST: 30 U/L (ref 0–37)
Albumin: 4.3 g/dL (ref 3.5–5.2)
Alkaline Phosphatase: 69 U/L (ref 39–117)
BUN: 11 mg/dL (ref 6–23)
CO2: 35 mEq/L — ABNORMAL HIGH (ref 19–32)
Calcium: 9.7 mg/dL (ref 8.4–10.5)
Chloride: 97 mEq/L (ref 96–112)
Creatinine, Ser: 0.67 mg/dL (ref 0.40–1.20)
GFR: 87.91 mL/min (ref >60.00)
Glucose, Bld: 114 mg/dL — ABNORMAL HIGH (ref 70–99)
Potassium: 3.2 mEq/L — ABNORMAL LOW (ref 3.5–5.1)
Sodium: 138 mEq/L (ref 135–145)
Total Bilirubin: 0.5 mg/dL (ref 0.2–1.2)
Total Protein: 6.6 g/dL (ref 6.0–8.3)

## 2019-12-13 ENCOUNTER — Other Ambulatory Visit: Payer: Self-pay | Admitting: Family Medicine

## 2019-12-13 MED ORDER — POTASSIUM CHLORIDE 20 MEQ/15ML (10%) PO SOLN: 20.0000 meq | Freq: Two times a day (BID) | ORAL | 1 refills | Status: DC

## 2019-12-16 ENCOUNTER — Other Ambulatory Visit: Payer: Self-pay | Admitting: Family Medicine

## 2019-12-20 ENCOUNTER — Other Ambulatory Visit: Payer: Self-pay | Admitting: Family Medicine

## 2019-12-31 ENCOUNTER — Other Ambulatory Visit: Payer: Self-pay

## 2019-12-31 ENCOUNTER — Telehealth (INDEPENDENT_AMBULATORY_CARE_PROVIDER_SITE_OTHER): Payer: Managed Care, Other (non HMO) | Admitting: Family Medicine

## 2019-12-31 DIAGNOSIS — E559 Vitamin D deficiency, unspecified: Secondary | ICD-10-CM | POA: Diagnosis not present

## 2019-12-31 DIAGNOSIS — E782 Mixed hyperlipidemia: Secondary | ICD-10-CM | POA: Diagnosis not present

## 2019-12-31 DIAGNOSIS — Z86010 Personal history of colonic polyps: Secondary | ICD-10-CM

## 2019-12-31 DIAGNOSIS — R739 Hyperglycemia, unspecified: Secondary | ICD-10-CM | POA: Diagnosis not present

## 2019-12-31 DIAGNOSIS — I10 Essential (primary) hypertension: Secondary | ICD-10-CM

## 2019-12-31 DIAGNOSIS — R05 Cough: Secondary | ICD-10-CM

## 2019-12-31 DIAGNOSIS — E876 Hypokalemia: Secondary | ICD-10-CM

## 2019-12-31 DIAGNOSIS — R059 Cough, unspecified: Secondary | ICD-10-CM

## 2019-12-31 DIAGNOSIS — G4733 Obstructive sleep apnea (adult) (pediatric): Secondary | ICD-10-CM

## 2019-12-31 NOTE — Assessment & Plan Note (Signed)
She follows with [facility] and is set to have repeat evaluation soon.

## 2019-12-31 NOTE — Assessment & Plan Note (Signed)
She needs to restart the CPAP if she is unable to tolerate she will need to see pulmonology to discuss further options.

## 2019-12-31 NOTE — Assessment & Plan Note (Signed)
hgba1c acceptable, minimize simple carbs. Increase exercise as tolerated.  

## 2019-12-31 NOTE — Assessment & Plan Note (Signed)
She notes blood pressure continues to hand just above 140 more than 1/2 of the time. She has had some BPs in the 120s and none noted above 140s. She had to stop the Chlorthalidone and potassium because when she started the potasium she devleoped a rash. The rash resolved when she stopped it. no changes to meds. Encouraged heart healthy diet such as the DASH diet and exercise as tolerated.

## 2019-12-31 NOTE — Assessment & Plan Note (Signed)
Supplement and monitor 

## 2019-12-31 NOTE — Assessment & Plan Note (Signed)
Encouraged heart healthy diet, increase exercise, avoid trans fats, consider a krill oil cap daily. Tolerating Rosuvastin 

## 2019-12-31 NOTE — Assessment & Plan Note (Signed)
She is better but dry cough continues to come and go. CXR ordered and she sees GI she will need to discuss need for upper endoscopy. If all of that is negative and cough persists then she has to see ENT

## 2019-12-31 NOTE — Progress Notes (Signed)
Virtual Visit via Video Note  I connected with [name] on 12/31/19 at 11:20 AM EDT by a video enabled telemedicine application and verified that I am speaking with the correct person using two identifiers.  Location: Patient: home, patient and provider are in visit Provider: home   I discussed the limitations of evaluation and management by telemedicine and the availability of in person appointments. The patient expressed understanding and agreed to proceed. Kem Boroughs, CMA was able to get the patient set up on a video visit   Subjective:    Patient ID: Margaret [name], adult    DOB: 11-27-1950, 69 y.o.   MRN: [id number]  Chief Complaint  Patient presents with  . Follow-up    HPI Patient is in today for follow up on chronic medical concerns. No recent febrile illness or hospitalizations. She had to stop Chlorthalidone and potassium as her potassium dropped and then when we added potassium supplements she developed a rash most notably on her arms. When she stopped it the rash resolved. Her diastolic BP and pulse have been normal but her systolic has ranged up to 140s more than 1/2 the time. Other numbers in the 120s. Denies palp/SOB/HA/congestion/fevers or GU c/o. Taking meds as prescribed. She has a chronic dry cough. It has improved but not resolved. Her heartburn has flared worse recently. She is seeing gastroenterology soon.   Past Medical History:  Diagnosis Date  . Allergic state 03/25/2012  . Allergy   . Aortic calcification ([place]) 08/27/2014  . Atherosclerosis of aorta ([place]) 04/12/2014  . Back pain   . Constipation 01/23/2017  . GERD (gastroesophageal reflux disease)   . Hiatal hernia with gastroesophageal reflux 04/27/2010   Qualifier: Diagnosis of  By: Danelle Earthly CMA, Darlene  Failed Omeprazole and Protonix   . Hx of adenomatous polyp of colon 01/23/2017   Colonoscopy 01/16/2017 with 2 polyps one was adenomatous. recommeded follow up in 5 years.performed by Dr  Silverio Decamp  . Hyperlipidemia   . Joint pain   . Osteopenia 08/31/2012  . Peripheral edema 12/13/2014  . Sleep apnea   . Unspecified sinusitis (chronic) 03/30/2013    Past Surgical History:  Procedure Laterality Date  . [place] STUDY N/A 12/15/2015   Procedure: [place] STUDY;  Surgeon: Mauri Pole, MD;  Location: [facility];  Service: Endoscopy;  Laterality: N/A;  . DILATION AND CURETTAGE OF UTERUS    . ESOPHAGEAL MANOMETRY N/A 12/15/2015   Procedure: ESOPHAGEAL MANOMETRY (EM);  Surgeon: Mauri Pole, MD;  Location: [facility];  Service: Endoscopy;  Laterality: N/A;    Family History  Problem Relation Age of Onset  . Stroke Mother   . High blood pressure Mother   . High Cholesterol Mother   . Lung cancer Father   . Stroke Sister        36  . Thyroid disease Sister   . Diabetes Sister   . Hypertension Sister   . Hyperlipidemia Sister   . Hypertension Sister   . Sleep apnea Son   . Obesity Son     Social History   Socioeconomic History  . Marital status: Married    Spouse name: [name]  . Number of children: 2  . Years of education: 30  . Highest education level: Not on file  Occupational History  . Occupation: Optometrist    Comment: retired  Tobacco Use  . Smoking status: Never Smoker  . Smokeless tobacco: Never Used  Vaping Use  . Vaping Use: Never  used  Substance and Sexual Activity  . Alcohol use: Yes    Alcohol/week: 0.0 standard drinks    Comment: Rarely  . Drug use: No  . Sexual activity: Not Currently    Comment: no dietary restrictions, lives with husand, works at [employer]  Other Topics Concern  . Not on file  Social History Narrative   Lives with husband   Caffeine- tea, 2 cups daily   Social Determinants of Health   Financial Resource Strain:   . Difficulty of Paying Living Expenses:   Food Insecurity:   . Worried About Charity fundraiser in the Last Year:   . Arboriculturist in the Last Year:   Transportation Needs:   . Lexicographer (Medical):   Marland Kitchen Lack of Transportation (Non-Medical):   Physical Activity:   . Days of Exercise per Week:   . Minutes of Exercise per Session:   Stress:   . Feeling of Stress :   Social Connections:   . Frequency of Communication with Friends and Family:   . Frequency of Social Gatherings with Friends and Family:   . Attends Religious Services:   . Active Member of Clubs or Organizations:   . Attends Archivist Meetings:   Marland Kitchen Marital Status:   Intimate Partner Violence:   . Fear of Current or Ex-Partner:   . Emotionally Abused:   Marland Kitchen Physically Abused:   . Sexually Abused:     Outpatient Medications Prior to Visit  Medication Sig Dispense Refill  . amLODipine (NORVASC) 5 MG tablet Take 1 tablet (5 mg total) by mouth daily. 90 tablet 0  . aspirin 81 MG chewable tablet Chew by mouth daily.    . fish oil-omega-3 fatty acids 1000 MG capsule Take 2 g by mouth daily.    . metoprolol succinate (TOPROL-XL) 50 MG 24 hr tablet TAKE 1 TABLET (50 MG TOTAL) BY MOUTH DAILY. TAKE WITH OR IMMEDIATELY FOLLOWING A MEAL. 90 tablet 1  . Multiple Vitamin (MULTIVITAMIN WITH MINERALS) TABS tablet Take 1 tablet by mouth daily.    . rosuvastatin (CRESTOR) 10 MG tablet Take 1 tablet (10 mg total) by mouth daily. 30 tablet 6  . VITAMIN D PO Take 2,500 Units by mouth daily.    . Zinc 50 MG TABS Take 1 tablet by mouth daily.    . benzonatate (TESSALON) 100 MG capsule TAKE 1-2 CAPSULES (100-200 MG TOTAL) BY MOUTH 2 (TWO) TIMES DAILY AS NEEDED FOR COUGH. 30 capsule 0  . chlorthalidone (HYGROTON) 25 MG tablet TAKE 1 TABLET BY MOUTH EVERY DAY 30 tablet 2  . famotidine (PEPCID) 20 MG tablet TAKE 1 TABLET BY MOUTH TWICE A DAY (Patient taking differently: Take 20 mg by mouth as needed. ) 180 tablet 1  . potassium chloride 20 MEQ/15ML (10%) SOLN Take 15 mLs (20 mEq total) by mouth 2 (two) times daily. 3800 mL 1  . sucralfate (CARAFATE) 1 g tablet TAKE 1 TABLET (1 G TOTAL) BY MOUTH 4 (FOUR) TIMES  DAILY - WITH MEALS AND AT BEDTIME. 120 tablet 1   No facility-administered medications prior to visit.    Allergies  Allergen Reactions  . Lisinopril Cough  . Potassium-Containing Compounds Rash    Review of Systems  Constitutional: Negative for fever and malaise/fatigue.  HENT: Negative for congestion.   Eyes: Negative for blurred vision.  Respiratory: Positive for cough. Negative for shortness of breath.   Cardiovascular: Positive for chest pain. Negative for palpitations and leg swelling.  Gastrointestinal: Positive for abdominal pain and heartburn. Negative for blood in stool and nausea.  Genitourinary: Negative for dysuria and frequency.  Musculoskeletal: Negative for falls.  Skin: Negative for rash.  Neurological: Negative for dizziness, loss of consciousness and headaches.  Endo/Heme/Allergies: Negative for environmental allergies.  Psychiatric/Behavioral: Negative for depression. The patient is not nervous/anxious.        Objective:    Physical Exam Constitutional:      Appearance: Normal appearance. She is not ill-appearing.  HENT:     Head: Normocephalic and atraumatic.     Right Ear: External ear normal.     Left Ear: External ear normal.     Nose: Nose normal.  Eyes:     General:        Right eye: No discharge.        Left eye: No discharge.  Pulmonary:     Effort: Pulmonary effort is normal.  Neurological:     Mental Status: She is alert and oriented to person, place, and time.  Psychiatric:        Behavior: Behavior normal.     There were no vitals taken for this visit. Wt Readings from Last 3 Encounters:  09/08/19 182 lb (82.6 kg)  07/31/19 177 lb (80.3 kg)  06/19/19 178 lb (80.7 kg)    Diabetic Foot Exam - Simple   No data filed     Lab Results  Component Value Date   WBC 6.3 11/13/2019   HGB 13.2 11/13/2019   HCT 38.7 11/13/2019   PLT 240.0 11/13/2019   GLUCOSE 114 (H) 12/10/2019   CHOL 165 11/13/2019   TRIG 81.0 11/13/2019   HDL  55.40 11/13/2019   LDLCALC 93 11/13/2019   ALT 48 (H) 12/10/2019   AST 30 12/10/2019   NA 138 12/10/2019   K 3.2 (L) 12/10/2019   CL 97 12/10/2019   CREATININE 0.67 12/10/2019   BUN 11 12/10/2019   CO2 35 (H) 12/10/2019   TSH 1.42 11/13/2019   HGBA1C 6.1 11/13/2019   MICROALBUR 3.06 (H) 05/16/2011    Lab Results  Component Value Date   TSH 1.42 11/13/2019   Lab Results  Component Value Date   WBC 6.3 11/13/2019   HGB 13.2 11/13/2019   HCT 38.7 11/13/2019   MCV 87.2 11/13/2019   PLT 240.0 11/13/2019   Lab Results  Component Value Date   NA 138 12/10/2019   K 3.2 (L) 12/10/2019   CO2 35 (H) 12/10/2019   GLUCOSE 114 (H) 12/10/2019   BUN 11 12/10/2019   CREATININE 0.67 12/10/2019   BILITOT 0.5 12/10/2019   ALKPHOS 69 12/10/2019   AST 30 12/10/2019   ALT 48 (H) 12/10/2019   PROT 6.6 12/10/2019   ALBUMIN 4.3 12/10/2019   CALCIUM 9.7 12/10/2019   ANIONGAP 6 11/27/2017   GFR 87.91 12/10/2019   Lab Results  Component Value Date   CHOL 165 11/13/2019   Lab Results  Component Value Date   HDL 55.40 11/13/2019   Lab Results  Component Value Date   LDLCALC 93 11/13/2019   Lab Results  Component Value Date   TRIG 81.0 11/13/2019   Lab Results  Component Value Date   CHOLHDL 3 11/13/2019   Lab Results  Component Value Date   HGBA1C 6.1 11/13/2019       Assessment & Plan:   Problem List Items Addressed This Visit    Hyperlipidemia, mixed    Encouraged heart healthy diet, increase exercise, avoid trans fats, consider  a krill oil cap daily. Tolerating Rosuvastin      Hyperglycemia    hgba1c acceptable, minimize simple carbs. Increase exercise as tolerated.      Essential hypertension    She notes blood pressure continues to hand just above 140 more than 1/2 of the time. She has had some BPs in the 120s and none noted above 140s. She had to stop the Chlorthalidone and potassium because when she started the potasium she devleoped a rash. The rash  resolved when she stopped it. no changes to meds. Encouraged heart healthy diet such as the DASH diet and exercise as tolerated.       Hx of colonic polyps    She follows with [facility] and is set to have repeat evaluation soon.      Obstructive sleep apnea syndrome    She needs to restart the CPAP if she is unable to tolerate she will need to see pulmonology to discuss further options.       Vitamin D deficiency    Supplement and monitor      Cough - Primary    She is better but dry cough continues to come and go. CXR ordered and she sees GI she will need to discuss need for upper endoscopy. If all of that is negative and cough persists then she has to see ENT      Relevant Orders   DG Chest 2 View    Other Visit Diagnoses    Hypokalemia       Relevant Orders   Comprehensive metabolic panel      I have discontinued Allisa Kato "[name]"'s famotidine, benzonatate, sucralfate, potassium chloride, and chlorthalidone. I am also having her maintain her fish oil-omega-3 fatty acids, multivitamin with minerals, aspirin, VITAMIN D PO, Zinc, rosuvastatin, metoprolol succinate, and amLODipine.  No orders of the defined types were placed in this encounter.   I discussed the assessment and treatment plan with the patient. The patient was provided an opportunity to ask questions and all were answered. The patient agreed with the plan and demonstrated an understanding of the instructions.   The patient was advised to call back or seek an in-person evaluation if the symptoms worsen or if the condition fails to improve as anticipated.  I provided 20 minutes of non-face-to-face time during this encounter.   Penni Homans, MD

## 2020-01-06 ENCOUNTER — Telehealth: Payer: Managed Care, Other (non HMO) | Admitting: Family Medicine

## 2020-01-08 ENCOUNTER — Other Ambulatory Visit: Payer: Self-pay

## 2020-01-08 ENCOUNTER — Ambulatory Visit (HOSPITAL_BASED_OUTPATIENT_CLINIC_OR_DEPARTMENT_OTHER)
Admission: RE | Admit: 2020-01-08 | Discharge: 2020-01-08 | Disposition: A | Discharge status: Home / Self Care | Payer: Managed Care, Other (non HMO) | Source: Ambulatory Visit | Attending: Family Medicine | Admitting: Family Medicine

## 2020-01-08 ENCOUNTER — Other Ambulatory Visit (INDEPENDENT_AMBULATORY_CARE_PROVIDER_SITE_OTHER): Payer: Managed Care, Other (non HMO)

## 2020-01-08 DIAGNOSIS — R059 Cough, unspecified: Secondary | ICD-10-CM

## 2020-01-08 DIAGNOSIS — R05 Cough: Secondary | ICD-10-CM | POA: Insufficient documentation

## 2020-01-08 DIAGNOSIS — E876 Hypokalemia: Secondary | ICD-10-CM

## 2020-01-09 ENCOUNTER — Telehealth: Payer: Self-pay | Admitting: Gastroenterology

## 2020-01-09 LAB — COMPREHENSIVE METABOLIC PANEL
ALT: 63 U/L — ABNORMAL HIGH (ref 0–35)
AST: 46 U/L — ABNORMAL HIGH (ref 0–37)
Albumin: 4.4 g/dL (ref 3.5–5.2)
Alkaline Phosphatase: 59 U/L (ref 39–117)
BUN: 12 mg/dL (ref 6–23)
CO2: 29 mEq/L (ref 19–32)
Calcium: 9.5 mg/dL (ref 8.4–10.5)
Chloride: 105 mEq/L (ref 96–112)
Creatinine, Ser: 0.68 mg/dL (ref 0.40–1.20)
GFR: 86.39 mL/min (ref >60.00)
Glucose, Bld: 155 mg/dL — ABNORMAL HIGH (ref 70–99)
Potassium: 3.8 mEq/L (ref 3.5–5.1)
Sodium: 141 mEq/L (ref 135–145)
Total Bilirubin: 0.5 mg/dL (ref 0.2–1.2)
Total Protein: 6.8 g/dL (ref 6.0–8.3)

## 2020-01-09 NOTE — Telephone Encounter (Signed)
Pt called stating that she is still having the same issues. She is asking is she can have EGD. Pls call her.

## 2020-01-12 NOTE — Telephone Encounter (Signed)
Called the patient back. No answer. Left a message for her to call to discuss.

## 2020-01-20 NOTE — Telephone Encounter (Signed)
Spoke with the patient. She was seen in April and given Carafate and IBgard. She states she has used these but continues to have symptoms. She will get temporary relief, but does not have resolution. Wakes up feeling bloated. States she does have daily bowel movements. She is careful with what she eats. She was to come in for follow up July or August. She requests to have an appointment with her provider before scheduling any procedures.  Appointment made.

## 2020-01-22 ENCOUNTER — Ambulatory Visit (INDEPENDENT_AMBULATORY_CARE_PROVIDER_SITE_OTHER): Payer: Managed Care, Other (non HMO) | Admitting: Gastroenterology

## 2020-01-22 ENCOUNTER — Encounter: Payer: Self-pay | Admitting: Gastroenterology

## 2020-01-22 VITALS — BP 130/70 | HR 80 | Ht 60.5 in

## 2020-01-22 DIAGNOSIS — R140 Abdominal distension (gaseous): Secondary | ICD-10-CM

## 2020-01-22 DIAGNOSIS — R1013 Epigastric pain: Secondary | ICD-10-CM

## 2020-01-22 DIAGNOSIS — Z86010 Personal history of colonic polyps: Secondary | ICD-10-CM

## 2020-01-22 MED ORDER — SUTAB 1479-225-188 MG PO TABS: 1.0000 | ORAL_TABLET | Freq: Once | ORAL | 0 refills | Status: AC

## 2020-01-22 NOTE — Patient Instructions (Signed)
You have been scheduled for an endoscopy and colonoscopy. Please follow the written instructions given to you at your visit today. Please pick up your prep supplies at the pharmacy within the next 1-3 days. If you use inhalers (even only as needed), please bring them with you on the day of your procedure.  Please follow up in 3 months

## 2020-01-22 NOTE — Progress Notes (Signed)
Margaret [name]    [id number]    1950-07-12  Primary Care Physician:Blyth, Bonnita Levan, MD  Referring Physician: Mosie Lukes, MD [address] [place],  [place] [address]   Chief complaint: GERD, Bloated  HPI:  69 yr very pleasant female here for follow-up visit with complaints of worsening abdominal bloating and intermittent GERD  GERD symptoms on and off No longer taking Omeprazole Overall she feels GERD symptoms are well controlled but she is more bothered by the severe abdominal bloating and distention.  Most mornings she wakes up with her abdomen fully distended and firm, somewhat better after she moves her bowels and moves around She has occasional epigastric abdominal pain  Denies any melena or rectal bleeding.  No vomiting  Has history of sleep apnea, usually breathes with her mouth, wakes up with dry mouth.  She is currently not using CPAP  History of H. pylori 2014 s/p treatment  Colonoscopy January 16, 2017: 10 mm adenomatous polyp removed from rectum.  5 mm sigmoid polyp removed.  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   Outpatient Encounter Medications as of 01/22/2020  Medication Sig  . amLODipine (NORVASC) 5 MG tablet Take 1 tablet (5 mg total) by mouth daily.  Marland Kitchen aspirin 81 MG chewable tablet Chew by mouth daily.  . fish oil-omega-3 fatty  acids 1000 MG capsule Take 2 g by mouth daily.  . metoprolol succinate (TOPROL-XL) 50 MG 24 hr tablet TAKE 1 TABLET (50 MG TOTAL) BY MOUTH DAILY. TAKE WITH OR IMMEDIATELY FOLLOWING A MEAL.  . Multiple Vitamin (MULTIVITAMIN WITH MINERALS) TABS tablet Take 1 tablet by mouth daily.  . rosuvastatin (CRESTOR) 10 MG tablet Take 1 tablet (10 mg total) by mouth daily.  Marland Kitchen VITAMIN D PO Take 2,500 Units by mouth daily.  . Zinc 50 MG TABS Take 1 tablet by mouth daily.   No facility-administered encounter medications on file as of 01/22/2020.    Allergies as of 01/22/2020 - Review Complete 01/22/2020  Allergen Reaction Noted  . Lisinopril Cough 06/17/2019  . Potassium-containing compounds Rash 12/31/2019    Past Medical History:  Diagnosis Date  . Allergic state 03/25/2012  . Allergy   . Aortic calcification ([place]) 08/27/2014  . Atherosclerosis of aorta ([place]) 04/12/2014  . Back pain   . Constipation 01/23/2017  . GERD (gastroesophageal reflux disease)   . Hiatal hernia with gastroesophageal reflux 04/27/2010   Qualifier: Diagnosis of  By: Danelle Earthly CMA, Darlene  Failed Omeprazole and Protonix   . Hx of adenomatous polyp of colon 01/23/2017   Colonoscopy 01/16/2017 with 2 polyps one was adenomatous. recommeded follow up in 5 years.performed by Dr Silverio Decamp  . Hyperlipidemia   . Joint pain   . Osteopenia 08/31/2012  . Peripheral edema 12/13/2014  . Sleep apnea   . Unspecified sinusitis (chronic) 03/30/2013  Past Surgical History:  Procedure Laterality Date  . [place] STUDY N/A 12/15/2015   Procedure: [place] STUDY;  Surgeon: Mauri Pole, MD;  Location: [facility];  Service: Endoscopy;  Laterality: N/A;  . DILATION AND CURETTAGE OF UTERUS    . ESOPHAGEAL MANOMETRY N/A 12/15/2015   Procedure: ESOPHAGEAL MANOMETRY (EM);  Surgeon: Mauri Pole, MD;  Location: [facility];  Service: Endoscopy;  Laterality: N/A;    Family History  Problem Relation Age of Onset  . Stroke Mother   .  High blood pressure Mother   . High Cholesterol Mother   . Lung cancer Father   . Stroke Sister        62  . Thyroid disease Sister   . Diabetes Sister   . Hypertension Sister   . Hyperlipidemia Sister   . Hypertension Sister   . Sleep apnea Son   . Obesity Son     Social History   Socioeconomic History  . Marital status: Married    Spouse name: [name]  . Number of children: 2  . Years of education: 42  . Highest education level: Not on file  Occupational History  . Occupation: Optometrist    Comment: retired  Tobacco Use  . Smoking status: Never Smoker  . Smokeless tobacco: Never Used  Vaping Use  . Vaping Use: Never used  Substance and Sexual Activity  . Alcohol use: Yes    Alcohol/week: 0.0 standard drinks    Comment: Rarely  . Drug use: No  . Sexual activity: Not Currently    Comment: no dietary restrictions, lives with husand, works at [employer]  Other Topics Concern  . Not on file  Social History Narrative   Lives with husband   Caffeine- tea, 2 cups daily   Social Determinants of Health   Financial Resource Strain:   . Difficulty of Paying Living Expenses: Not on file  Food Insecurity:   . Worried About Charity fundraiser in the Last Year: Not on file  . Ran Out of Food in the Last Year: Not on file  Transportation Needs:   . Lack of Transportation (Medical): Not on file  . Lack of Transportation (Non-Medical): Not on file  Physical Activity:   . Days of Exercise per Week: Not on file  . Minutes of Exercise per Session: Not on file  Stress:   . Feeling of Stress : Not on file  Social Connections:   . Frequency of Communication with Friends and Family: Not on file  . Frequency of Social Gatherings with Friends and Family: Not on file  . Attends Religious Services: Not on file  . Active Member of Clubs or Organizations: Not on file  . Attends Archivist Meetings: Not on file  . Marital Status: Not on file  Intimate Partner Violence:   . Fear  of Current or Ex-Partner: Not on file  . Emotionally Abused: Not on file  . Physically Abused: Not on file  . Sexually Abused: Not on file      Review of systems: All other review of systems negative except as mentioned in the HPI.   Physical Exam: Vitals:   01/22/20 0953  BP: 130/70  Pulse: 80   Body mass index is 34.96 kg/m. Gen:      No acute distress HEENT:  sclera anicteric Abd:      soft, non-tender; no palpable masses, no distension Ext:    No edema Neuro: alert and oriented  x 3 Psych: normal mood and affect  Data Reviewed:  Reviewed labs, radiology imaging, old records and pertinent past GI work up   Assessment and Plan/Recommendations:  69 year old very pleasant female with history of chronic GERD, chronic irritable bowel syndrome and abdominal bloating  GERD: Worried about potential side effects with long-term PPI use Discussed long-term adverse reaction with PPI in detail Okay to use it as needed for breakthrough symptoms.  Can also try H2 blocker Pepcid 20 mg daily as needed or Gaviscon as needed Antireflux measures  Abdominal bloating: Unclear etiology Possible food intolerance versus small intestinal bacterial overgrowth versus aerophagia during sleep as most of her symptoms occur in the morning right after she wakes up Advised patient to discuss with her pulmonologist/sleep specialist regarding refitting for CPAP machine for sleep apnea Maintain food symptom diary We will consider lactulose breath test for further evaluation of small intestinal bacterial overgrowth if continues to have persistent symptoms  History of large adenomatous colon polyps, due for surveillance colonoscopy  Epigastric abdominal pain and history of H. Pylori s/p treatment. Will plan to schedule EGD along with colonoscopy  The risks and benefits as well as alternatives of endoscopic procedure(s) have been discussed and reviewed. All questions answered. The patient agrees to  proceed.  Return in 3 months or sooner if needed  This visit required >40 minutes of patient care (this includes precharting, chart review, review of results, face-to-face time used for counseling as well as treatment plan and follow-up. The patient was provided an opportunity to ask questions and all were answered. The patient agreed with the plan and demonstrated an understanding of the instructions.  Damaris Hippo , MD    CC: Mosie Lukes, MD

## 2020-02-14 ENCOUNTER — Other Ambulatory Visit: Payer: Self-pay | Admitting: Family Medicine

## 2020-03-15 ENCOUNTER — Ambulatory Visit (INDEPENDENT_AMBULATORY_CARE_PROVIDER_SITE_OTHER): Payer: Managed Care, Other (non HMO) | Admitting: Family Medicine

## 2020-03-15 ENCOUNTER — Other Ambulatory Visit: Payer: Self-pay

## 2020-03-15 ENCOUNTER — Other Ambulatory Visit: Payer: Self-pay | Admitting: Family Medicine

## 2020-03-15 VITALS — BP 130/64 | HR 74 | Temp 98.3°F | Wt 179.0 lb

## 2020-03-15 DIAGNOSIS — I10 Essential (primary) hypertension: Secondary | ICD-10-CM | POA: Diagnosis not present

## 2020-03-15 DIAGNOSIS — R739 Hyperglycemia, unspecified: Secondary | ICD-10-CM

## 2020-03-15 DIAGNOSIS — K219 Gastro-esophageal reflux disease without esophagitis: Secondary | ICD-10-CM

## 2020-03-15 DIAGNOSIS — Z23 Encounter for immunization: Secondary | ICD-10-CM | POA: Diagnosis not present

## 2020-03-15 DIAGNOSIS — K449 Diaphragmatic hernia without obstruction or gangrene: Secondary | ICD-10-CM

## 2020-03-15 DIAGNOSIS — E559 Vitamin D deficiency, unspecified: Secondary | ICD-10-CM | POA: Diagnosis not present

## 2020-03-15 DIAGNOSIS — G4700 Insomnia, unspecified: Secondary | ICD-10-CM

## 2020-03-15 DIAGNOSIS — M8580 Other specified disorders of bone density and structure, unspecified site: Secondary | ICD-10-CM

## 2020-03-15 DIAGNOSIS — E782 Mixed hyperlipidemia: Secondary | ICD-10-CM

## 2020-03-15 NOTE — Assessment & Plan Note (Signed)
Encouraged to get adequate exercise, calcium and vitamin d intake 

## 2020-03-15 NOTE — Assessment & Plan Note (Signed)
Encouraged good sleep hygiene such as dark, quiet room. No blue/green glowing lights such as computer screens in bedroom. No alcohol or stimulants in evening. Cut down on caffeine as able. Regular exercise is helpful but not just prior to bed time.  

## 2020-03-15 NOTE — Assessment & Plan Note (Signed)
Is following with gastroenterology now and she has a follow up appointment soon

## 2020-03-15 NOTE — Assessment & Plan Note (Signed)
Encouraged heart healthy diet, increase exercise, avoid trans fats, consider a krill oil cap daily 

## 2020-03-15 NOTE — Progress Notes (Signed)
Subjective:    Patient ID: Margaret [name], adult    DOB: 1950/11/15, 69 y.o.   MRN: [id number]  Chief Complaint  Patient presents with  . Follow-up  . Hypertension    HPI Patient is in today for follow up on chronic medical concerns. No recent febrile illness or hospitalizations. No polyuria or polydipsia. Is trying to eat right and stay active. She notes her blood pressures have been running slightly high at home. She notes some ongoing bilateral joint pain knees and shoulders notably. Denies CP/palp/SOB/HA/congestion/fevers/GI or GU c/o. Taking meds as prescribed  Past Medical History:  Diagnosis Date  . Allergic state 03/25/2012  . Allergy   . Aortic calcification ([place]) 08/27/2014  . Atherosclerosis of aorta ([place]) 04/12/2014  . Back pain   . Constipation 01/23/2017  . GERD (gastroesophageal reflux disease)   . Hiatal hernia with gastroesophageal reflux 04/27/2010   Qualifier: Diagnosis of  By: Danelle Earthly CMA, Darlene  Failed Omeprazole and Protonix   . Hx of adenomatous polyp of colon 01/23/2017   Colonoscopy 01/16/2017 with 2 polyps one was adenomatous. recommeded follow up in 5 years.performed by Dr Silverio Decamp  . Hyperlipidemia   . Joint pain   . Osteopenia 08/31/2012  . Peripheral edema 12/13/2014  . Sleep apnea   . Unspecified sinusitis (chronic) 03/30/2013    Past Surgical History:  Procedure Laterality Date  . [place] STUDY N/A 12/15/2015   Procedure: [place] STUDY;  Surgeon: Mauri Pole, MD;  Location: [facility];  Service: Endoscopy;  Laterality: N/A;  . DILATION AND CURETTAGE OF UTERUS    . ESOPHAGEAL MANOMETRY N/A 12/15/2015   Procedure: ESOPHAGEAL MANOMETRY (EM);  Surgeon: Mauri Pole, MD;  Location: [facility];  Service: Endoscopy;  Laterality: N/A;    Family History  Problem Relation Age of Onset  . Stroke Mother   . High blood pressure Mother   . High Cholesterol Mother   . Lung cancer Father   . Stroke Sister        71  . Thyroid  disease Sister   . Diabetes Sister   . Hypertension Sister   . Hyperlipidemia Sister   . Hypertension Sister   . Sleep apnea Son   . Obesity Son     Social History   Socioeconomic History  . Marital status: Married    Spouse name: [name]  . Number of children: 2  . Years of education: 55  . Highest education level: Not on file  Occupational History  . Occupation: Optometrist    Comment: retired  Tobacco Use  . Smoking status: Never Smoker  . Smokeless tobacco: Never Used  Vaping Use  . Vaping Use: Never used  Substance and Sexual Activity  . Alcohol use: Yes    Alcohol/week: 0.0 standard drinks    Comment: Rarely  . Drug use: No  . Sexual activity: Not Currently    Comment: no dietary restrictions, lives with husand, works at [employer]  Other Topics Concern  . Not on file  Social History Narrative   Lives with husband   Caffeine- tea, 2 cups daily   Social Determinants of Health   Financial Resource Strain:   . Difficulty of Paying Living Expenses: Not on file  Food Insecurity:   . Worried About Charity fundraiser in the Last Year: Not on file  . Ran Out of Food in the Last Year: Not on file  Transportation Needs:   . Lack of Transportation (Medical):  Not on file  . Lack of Transportation (Non-Medical): Not on file  Physical Activity:   . Days of Exercise per Week: Not on file  . Minutes of Exercise per Session: Not on file  Stress:   . Feeling of Stress : Not on file  Social Connections:   . Frequency of Communication with Friends and Family: Not on file  . Frequency of Social Gatherings with Friends and Family: Not on file  . Attends Religious Services: Not on file  . Active Member of Clubs or Organizations: Not on file  . Attends Archivist Meetings: Not on file  . Marital Status: Not on file  Intimate Partner Violence:   . Fear of Current or Ex-Partner: Not on file  . Emotionally Abused: Not on file  . Physically Abused: Not on file  . Sexually  Abused: Not on file    Outpatient Medications Prior to Visit  Medication Sig Dispense Refill  . amLODipine (NORVASC) 5 MG tablet Take 1 tablet (5 mg total) by mouth daily. 90 tablet 0  . aspirin 81 MG chewable tablet Chew by mouth daily.    . fish oil-omega-3 fatty acids 1000 MG capsule Take 2 g by mouth daily.    . metoprolol succinate (TOPROL-XL) 50 MG 24 hr tablet Take 1 tablet (50 mg total) by mouth daily. Take with or immediately following a meal. 90 tablet 1  . Multiple Vitamin (MULTIVITAMIN WITH MINERALS) TABS tablet Take 1 tablet by mouth daily.    . rosuvastatin (CRESTOR) 10 MG tablet TAKE 1 TABLET BY MOUTH EVERY DAY 30 tablet 6  . VITAMIN D PO Take 2,500 Units by mouth daily.    . Zinc 50 MG TABS Take 1 tablet by mouth daily.     No facility-administered medications prior to visit.    Allergies  Allergen Reactions  . Lisinopril Cough  . Potassium-Containing Compounds Rash    Review of Systems  Constitutional: Negative for fever and malaise/fatigue.  HENT: Negative for congestion.   Eyes: Negative for blurred vision.  Respiratory: Negative for shortness of breath.   Cardiovascular: Negative for chest pain, palpitations and leg swelling.  Gastrointestinal: Negative for abdominal pain, blood in stool and nausea.  Genitourinary: Negative for dysuria and frequency.  Musculoskeletal: Positive for joint pain. Negative for falls.  Skin: Negative for rash.  Neurological: Negative for dizziness, loss of consciousness and headaches.  Endo/Heme/Allergies: Negative for environmental allergies.  Psychiatric/Behavioral: Negative for depression. The patient is not nervous/anxious.        Objective:    Physical Exam Vitals and nursing note reviewed.  Constitutional:      General: She is not in acute distress.    Appearance: She is well-developed.  HENT:     Head: Normocephalic and atraumatic.     Nose: Nose normal.  Eyes:     General:        Right eye: No discharge.         Left eye: No discharge.  Cardiovascular:     Rate and Rhythm: Normal rate and regular rhythm.     Heart sounds: No murmur heard.   Pulmonary:     Effort: Pulmonary effort is normal.     Breath sounds: Normal breath sounds.  Abdominal:     General: Bowel sounds are normal.     Palpations: Abdomen is soft.     Tenderness: There is no abdominal tenderness.  Musculoskeletal:     Cervical back: Normal range of motion and neck  supple.  Skin:    General: Skin is warm and dry.  Neurological:     Mental Status: She is alert and oriented to person, place, and time.     BP 130/64   Pulse 74   Temp 98.3 F (36.8 C)   Wt 179 lb (81.2 kg)   BMI 34.38 kg/m  Wt Readings from Last 3 Encounters:  03/15/20 179 lb (81.2 kg)  09/08/19 182 lb (82.6 kg)  07/31/19 177 lb (80.3 kg)    Diabetic Foot Exam - Simple   No data filed     Lab Results  Component Value Date   WBC 6.3 11/13/2019   HGB 13.2 11/13/2019   HCT 38.7 11/13/2019   PLT 240.0 11/13/2019   GLUCOSE 155 (H) 01/08/2020   CHOL 165 11/13/2019   TRIG 81.0 11/13/2019   HDL 55.40 11/13/2019   LDLCALC 93 11/13/2019   ALT 63 (H) 01/08/2020   AST 46 (H) 01/08/2020   NA 141 01/08/2020   K 3.8 01/08/2020   CL 105 01/08/2020   CREATININE 0.68 01/08/2020   BUN 12 01/08/2020   CO2 29 01/08/2020   TSH 1.42 11/13/2019   HGBA1C 6.1 11/13/2019   MICROALBUR 3.06 (H) 05/16/2011    Lab Results  Component Value Date   TSH 1.42 11/13/2019   Lab Results  Component Value Date   WBC 6.3 11/13/2019   HGB 13.2 11/13/2019   HCT 38.7 11/13/2019   MCV 87.2 11/13/2019   PLT 240.0 11/13/2019   Lab Results  Component Value Date   NA 141 01/08/2020   K 3.8 01/08/2020   CO2 29 01/08/2020   GLUCOSE 155 (H) 01/08/2020   BUN 12 01/08/2020   CREATININE 0.68 01/08/2020   BILITOT 0.5 01/08/2020   ALKPHOS 59 01/08/2020   AST 46 (H) 01/08/2020   ALT 63 (H) 01/08/2020   PROT 6.8 01/08/2020   ALBUMIN 4.4 01/08/2020   CALCIUM 9.5  01/08/2020   ANIONGAP 6 11/27/2017   GFR 86.39 01/08/2020   Lab Results  Component Value Date   CHOL 165 11/13/2019   Lab Results  Component Value Date   HDL 55.40 11/13/2019   Lab Results  Component Value Date   LDLCALC 93 11/13/2019   Lab Results  Component Value Date   TRIG 81.0 11/13/2019   Lab Results  Component Value Date   CHOLHDL 3 11/13/2019   Lab Results  Component Value Date   HGBA1C 6.1 11/13/2019       Assessment & Plan:   Problem List Items Addressed This Visit    Hyperlipidemia, mixed    Encouraged heart healthy diet, increase exercise, avoid trans fats, consider a krill oil cap daily      Relevant Orders   Lipid panel   Osteopenia    Encouraged to get adequate exercise, calcium and vitamin d intake      Hyperglycemia    hgba1c acceptable, minimize simple carbs. Increase exercise as tolerated.      Relevant Orders   Hemoglobin A1c   Essential hypertension    Well controlled, no changes to meds. Encouraged heart healthy diet such as the DASH diet and exercise as tolerated. BP log at home is running higher than here. Often systolic is in the 993Z. She will continue to monitor resting bp and let us know if numbers run higher.       Relevant Orders   CBC   Comprehensive metabolic panel   Vitamin D deficiency    Supplement  and monitor       Relevant Orders   VITAMIN D 25 Hydroxy (Vit-D Deficiency, Fractures)   Insomnia    Encouraged good sleep hygiene such as dark, quiet room. No blue/green glowing lights such as computer screens in bedroom. No alcohol or stimulants in evening. Cut down on caffeine as able. Regular exercise is helpful but not just prior to bed time.        Other Visit Diagnoses    Need for influenza vaccination    -  Primary   Relevant Orders   Flu Vaccine QUAD High Dose(Fluad) (Completed)      I am having Margaret Lolli "[name]" maintain her fish oil-omega-3 fatty acids, multivitamin with minerals, aspirin, VITAMIN D  PO, Zinc, amLODipine, rosuvastatin, and metoprolol succinate.  No orders of the defined types were placed in this encounter.    Penni Homans, MD

## 2020-03-15 NOTE — Assessment & Plan Note (Signed)
Supplement and monitor 

## 2020-03-15 NOTE — Patient Instructions (Signed)

## 2020-03-15 NOTE — Assessment & Plan Note (Addendum)
Well controlled, no changes to meds. Encouraged heart healthy diet such as the DASH diet and exercise as tolerated. BP log at home is running higher than here. Often systolic is in the 092Z. She will continue to monitor resting bp and let us know if numbers run higher.

## 2020-03-15 NOTE — Assessment & Plan Note (Signed)
hgba1c acceptable, minimize simple carbs. Increase exercise as tolerated.  

## 2020-03-16 LAB — COMPREHENSIVE METABOLIC PANEL
AG Ratio: 1.8 (calc) (ref 1.0–2.5)
ALT: 58 U/L — ABNORMAL HIGH (ref 6–29)
AST: 46 U/L — ABNORMAL HIGH (ref 10–35)
Albumin: 4.4 g/dL (ref 3.6–5.1)
Alkaline phosphatase (APISO): 71 U/L (ref 37–153)
BUN: 13 mg/dL (ref 7–25)
CO2: 29 mmol/L (ref 20–32)
Calcium: 9.8 mg/dL (ref 8.6–10.4)
Chloride: 105 mmol/L (ref 98–110)
Creat: 0.66 mg/dL (ref 0.50–0.99)
Globulin: 2.5 g/dL (calc) (ref 1.9–3.7)
Glucose, Bld: 100 mg/dL — ABNORMAL HIGH (ref 65–99)
Potassium: 4.1 mmol/L (ref 3.5–5.3)
Sodium: 141 mmol/L (ref 135–146)
Total Bilirubin: 0.5 mg/dL (ref 0.2–1.2)
Total Protein: 6.9 g/dL (ref 6.1–8.1)

## 2020-03-16 LAB — VITAMIN D 25 HYDROXY (VIT D DEFICIENCY, FRACTURES): Vit D, 25-Hydroxy: 23 ng/mL — ABNORMAL LOW (ref 30–100)

## 2020-03-16 LAB — CBC
HCT: 41.3 % (ref 35.0–45.0)
Hemoglobin: 13.8 g/dL (ref 11.7–15.5)
MCH: 29.6 pg (ref 27.0–33.0)
MCHC: 33.4 g/dL (ref 32.0–36.0)
MCV: 88.4 fL (ref 80.0–100.0)
MPV: 10.3 fL (ref 7.5–12.5)
Platelets: 235 10*3/uL (ref 140–400)
RBC: 4.67 10*6/uL (ref 3.80–5.10)
RDW: 13.1 % (ref 11.0–15.0)
WBC: 5.3 10*3/uL (ref 3.8–10.8)

## 2020-03-16 LAB — LIPID PANEL
Cholesterol: 162 mg/dL (ref <200)
HDL: 55 mg/dL (ref >=50)
LDL Cholesterol (Calc): 83 mg/dL (calc)
Non-HDL Cholesterol (Calc): 107 mg/dL (calc) (ref <130)
Total CHOL/HDL Ratio: 2.9 (calc) (ref <5.0)
Triglycerides: 144 mg/dL (ref <150)

## 2020-03-16 LAB — HEMOGLOBIN A1C
Hgb A1c MFr Bld: 6.2 % of total Hgb — ABNORMAL HIGH (ref <5.7)
Mean Plasma Glucose: 131 (calc)
eAG (mmol/L): 7.3 (calc)

## 2020-03-17 ENCOUNTER — Other Ambulatory Visit: Payer: Self-pay

## 2020-03-17 DIAGNOSIS — E559 Vitamin D deficiency, unspecified: Secondary | ICD-10-CM

## 2020-03-17 MED ORDER — VITAMIN D (ERGOCALCIFEROL) 1.25 MG (50000 UNIT) PO CAPS: 50000.0000 [IU] | ORAL_CAPSULE | ORAL | 4 refills | Status: DC

## 2020-03-17 NOTE — Progress Notes (Signed)
Called pt informed of providers change in meds. Pt already taking 1000 UI of vitamin D will increase to 2000 daily. Will also pick up the 500000 UI cap from pharmacy

## 2020-03-19 ENCOUNTER — Encounter: Payer: Self-pay | Admitting: Gastroenterology

## 2020-03-25 ENCOUNTER — Encounter: Payer: Self-pay | Admitting: Gastroenterology

## 2020-03-25 ENCOUNTER — Ambulatory Visit (AMBULATORY_SURGERY_CENTER): Payer: Managed Care, Other (non HMO) | Admitting: Gastroenterology

## 2020-03-25 ENCOUNTER — Other Ambulatory Visit: Payer: Self-pay

## 2020-03-25 VITALS — BP 114/53 | HR 56 | Temp 97.7°F | Resp 11 | Ht 60.0 in

## 2020-03-25 DIAGNOSIS — K621 Rectal polyp: Secondary | ICD-10-CM

## 2020-03-25 DIAGNOSIS — K635 Polyp of colon: Secondary | ICD-10-CM | POA: Diagnosis not present

## 2020-03-25 DIAGNOSIS — R1013 Epigastric pain: Secondary | ICD-10-CM

## 2020-03-25 DIAGNOSIS — K2970 Gastritis, unspecified, without bleeding: Secondary | ICD-10-CM

## 2020-03-25 DIAGNOSIS — K319 Disease of stomach and duodenum, unspecified: Secondary | ICD-10-CM

## 2020-03-25 DIAGNOSIS — D128 Benign neoplasm of rectum: Secondary | ICD-10-CM

## 2020-03-25 DIAGNOSIS — K317 Polyp of stomach and duodenum: Secondary | ICD-10-CM | POA: Diagnosis not present

## 2020-03-25 DIAGNOSIS — D124 Benign neoplasm of descending colon: Secondary | ICD-10-CM

## 2020-03-25 DIAGNOSIS — Z86010 Personal history of colonic polyps: Secondary | ICD-10-CM

## 2020-03-25 MED ORDER — OMEPRAZOLE 40 MG PO CPDR: 40.0000 mg | DELAYED_RELEASE_CAPSULE | Freq: Every day | ORAL | 0 refills | Status: DC

## 2020-03-25 MED ORDER — SUCRALFATE 1 G PO TABS: 1.0000 g | ORAL_TABLET | Freq: Three times a day (TID) | ORAL | 0 refills | Status: DC

## 2020-03-25 MED ORDER — SODIUM CHLORIDE 0.9 % IV SOLN: 500.0000 mL | Freq: Once | INTRAVENOUS | Status: DC

## 2020-03-25 NOTE — Patient Instructions (Signed)
Handouts on polyps and diverticulosis given to you today  Await pathology results from Dr. Herbie Drape up and start new prescriptions for omeprazole and carafate Follow antireflux regimen Expect call from office to set up follow up appointment    [place]:   Refer to the procedure report that was given to you for any specific questions about what was found during the examination.  If the procedure report does not answer your questions, please call your gastroenterologist to clarify.  If you requested that your care partner not be given the details of your procedure findings, then the procedure report has been included in a sealed envelope for you to review at your convenience later.  YOU SHOULD EXPECT: Some feelings of bloating in the abdomen. Passage of more gas than usual.  Walking can help get rid of the air that was put into your GI tract during the procedure and reduce the bloating. If you had a lower endoscopy (such as a colonoscopy or flexible sigmoidoscopy) you may notice spotting of blood in your stool or on the toilet paper. If you underwent a bowel prep for your procedure, you may not have a normal bowel movement for a few days.  Please Note:  You might notice some irritation and congestion in your nose or some drainage.  This is from the oxygen used during your procedure.  There is no need for concern and it should clear up in a day or so.  SYMPTOMS TO REPORT IMMEDIATELY:   Following lower endoscopy (colonoscopy or flexible sigmoidoscopy):  Excessive amounts of blood in the stool  Significant tenderness or worsening of abdominal pains  Swelling of the abdomen that is new, acute  Fever of 100F or higher   Following upper endoscopy (EGD)  Vomiting of blood or coffee ground material  New chest pain or pain under the shoulder blades  Painful or persistently difficult swallowing  New shortness of breath  Fever of  100F or higher  Black, tarry-looking stools  For urgent or emergent issues, a gastroenterologist can be reached at any hour by calling [phone]. Do not use MyChart messaging for urgent concerns.    DIET:  We do recommend a small meal at first, but then you may proceed to your regular diet.  Drink plenty of fluids but you should avoid alcoholic beverages for 24 hours.  ACTIVITY:  You should plan to take it easy for the rest of today and you should NOT DRIVE or use heavy machinery until tomorrow (because of the sedation medicines used during the test).    FOLLOW UP: Our staff will call the number listed on your records 48-72 hours following your procedure to check on you and address any questions or concerns that you may have regarding the information given to you following your procedure. If we do not reach you, we will leave a message.  We will attempt to reach you two times.  During this call, we will ask if you have developed any symptoms of COVID 19. If you develop any symptoms (ie: fever, flu-like symptoms, shortness of breath, cough etc.) before then, please call [phone].  If you test positive for Covid 19 in the 2 weeks post procedure, please call and report this information to [place].    If any biopsies were taken you will be contacted by phone or by letter within the next 1-3 weeks.  Please call us at [phone] if you  have not heard about the biopsies in 3 weeks.    SIGNATURES/CONFIDENTIALITY: You and/or your care partner have signed paperwork which will be entered into your electronic medical record.  These signatures attest to the fact that that the information above on your After Visit Summary has been reviewed and is understood.  Full responsibility of the confidentiality of this discharge information lies with you and/or your care-partner.

## 2020-03-25 NOTE — Op Note (Signed)
[place] Patient Name: Margaret [name] Procedure Date: 03/25/2020 10:07 AM MRN: [id number] Endoscopist: Mauri Pole , MD Age: 69 Referring MD:  Date of Birth: [birth year] Gender: Female Account #: [id number] Procedure:                Colonoscopy Indications:              High risk colon cancer surveillance: Personal                            history of colonic polyps, High risk colon cancer                            surveillance: Personal history of adenoma (10 mm or                            greater in size) Medicines:                Monitored Anesthesia Care Procedure:                Pre-Anesthesia Assessment:                           - Prior to the procedure, a History and Physical                            was performed, and patient medications and                            allergies were reviewed. The patient's tolerance of                            previous anesthesia was also reviewed. The risks                            and benefits of the procedure and the sedation                            options and risks were discussed with the patient.                            All questions were answered, and informed consent                            was obtained. Prior Anticoagulants: The patient has                            taken no previous anticoagulant or antiplatelet                            agents. ASA Grade Assessment: II - A patient with                            mild systemic disease. After reviewing the risks  and benefits, the patient was deemed in                            satisfactory condition to undergo the procedure.                           After obtaining informed consent, the colonoscope                            was passed under direct vision. Throughout the                            procedure, the patient's blood pressure, pulse, and                            oxygen saturations were monitored  continuously. The                            Colonoscope was introduced through the anus and                            advanced to the the cecum, identified by                            appendiceal orifice and ileocecal valve. The                            quality of the bowel preparation was good. The                            ileocecal valve, appendiceal orifice, and rectum                            were photographed. Scope In: 10:38:00 AM Scope Out: 11:02:27 AM Scope Withdrawal Time: 0 hours 12 minutes 38 seconds  Total Procedure Duration: 0 hours 24 minutes 27 seconds  Findings:                 The perianal and digital rectal examinations were                            normal.                           A 5 mm polyp was found in the descending colon. The                            polyp was sessile. The polyp was removed with a                            cold snare. Resection and retrieval were complete.                           Two sessile polyps were found in the rectum. The  polyps were 1 to 2 mm in size. These polyps were                            removed with a cold biopsy forceps. Resection and                            retrieval were complete.                           Scattered small and large-mouthed diverticula were                            found in the sigmoid colon, descending colon and                            ascending colon.                           Non-bleeding internal hemorrhoids were found during                            retroflexion. The hemorrhoids were small. Complications:            No immediate complications. Estimated Blood Loss:     Estimated blood loss was minimal. Impression:               - One 5 mm polyp in the descending colon, removed                            with a cold snare. Resected and retrieved.                           - Two 1 to 2 mm polyps in the rectum, removed with                            a  cold biopsy forceps. Resected and retrieved.                           - Moderate diverticulosis in the sigmoid colon, in                            the descending colon and in the ascending colon.                           - Non-bleeding internal hemorrhoids. Recommendation:           - Patient has a contact number available for                            emergencies. The signs and symptoms of potential                            delayed complications were discussed with the  patient. Return to normal activities tomorrow.                            Written discharge instructions were provided to the                            patient.                           - Resume previous diet.                           - Continue present medications.                           - Await pathology results.                           - Repeat colonoscopy in 3 - 5 years for                            surveillance based on pathology results. Mauri Pole, MD 03/25/2020 11:13:32 AM This report has been signed electronically.

## 2020-03-25 NOTE — Progress Notes (Signed)
Report to PACU, RN, vss, BBS= Clear.  

## 2020-03-25 NOTE — Op Note (Addendum)
[facility] Patient Name: Margaret [name] Procedure Date: 03/25/2020 10:08 AM MRN: [id number] Endoscopist: Mauri Pole , MD Age: 69 Referring MD:  Date of Birth: January 09, 1951 Gender: Female Account #: [id number] Procedure:                Upper GI endoscopy Indications:              Epigastric abdominal pain Medicines:                Monitored Anesthesia Care Procedure:                Pre-Anesthesia Assessment:                           - Prior to the procedure, a History and Physical                            was performed, and patient medications and                            allergies were reviewed. The patient's tolerance of                            previous anesthesia was also reviewed. The risks                            and benefits of the procedure and the sedation                            options and risks were discussed with the patient.                            All questions were answered, and informed consent                            was obtained. Prior Anticoagulants: The patient has                            taken no previous anticoagulant or antiplatelet                            agents. ASA Grade Assessment: II - A patient with                            mild systemic disease. After reviewing the risks                            and benefits, the patient was deemed in                            satisfactory condition to undergo the procedure.                           After obtaining informed consent, the endoscope was  passed under direct vision. Throughout the                            procedure, the patient's blood pressure, pulse, and                            oxygen saturations were monitored continuously. The                            Endoscope was introduced through the mouth, and                            advanced to the second part of duodenum. The upper                            GI endoscopy was  accomplished without difficulty.                            The patient tolerated the procedure well. 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                           The Z-line was regular and was found 35 cm from the                            incisors.                           The gastroesophageal flap valve was visualized                            endoscopically and classified as Hill Grade III                            (minimal fold, loose to endoscope, hiatal hernia                            likely).                           Diffuse mild inflammation characterized by                            congestion (edema), erythema, friability and mucus                            was found in the entire examined stomach. Biopsies                            were taken with a cold  forceps for Helicobacter                            pylori testing.                           A small, infiltrative, polypoid and ulcerated,                            non-circumferential mass with no bleeding and                            stigmata of recent bleeding was found in the                            gastric body. Biopsies were taken with a cold                            forceps for histology.                           The examined duodenum was normal. Complications:            No immediate complications. Estimated Blood Loss:     Estimated blood loss was minimal. Impression:               - Submucosal nodule found in the esophagus.                           - Z-line regular, 35 cm from the incisors.                           - Gastroesophageal flap valve classified as Hill                            Grade III (minimal fold, loose to endoscope, hiatal                            hernia likely).                           - Gastritis. Biopsied.                            - Rule out malignancy, gastric tumor in the gastric                            body. Biopsied.                           - Normal examined duodenum. Recommendation:           - Resume previous diet.                           - Continue present medications.                           -  Use Prilosec (omeprazole) 40 mg PO daily for 3                            months.                           - Use sucralfate tablets 1 gram PO QID for 1 month.                           - Follow an antireflux regimen.                           - Return to my office at the next available                            appointment. Mauri Pole, MD 03/25/2020 11:10:27 AM This report has been signed electronically.

## 2020-03-25 NOTE — Progress Notes (Signed)
Called to room to assist during endoscopic procedure.  Patient ID and intended procedure confirmed with present staff. Received instructions for my participation in the procedure from the performing physician.  

## 2020-03-29 ENCOUNTER — Telehealth: Payer: Self-pay | Admitting: *Deleted

## 2020-03-29 NOTE — Telephone Encounter (Signed)
Second follow up call made. Left message.

## 2020-03-31 NOTE — Telephone Encounter (Signed)
Patient returned your follow-up call after her procedure. She stated that she had blood in her stool for two days after procedure but it has resolved and she is feeling fine.

## 2020-04-07 ENCOUNTER — Other Ambulatory Visit: Payer: Self-pay | Admitting: Family Medicine

## 2020-04-09 ENCOUNTER — Ambulatory Visit: Payer: Managed Care, Other (non HMO) | Attending: Internal Medicine

## 2020-04-09 ENCOUNTER — Other Ambulatory Visit (HOSPITAL_BASED_OUTPATIENT_CLINIC_OR_DEPARTMENT_OTHER): Payer: Self-pay | Admitting: Internal Medicine

## 2020-04-09 DIAGNOSIS — Z23 Encounter for immunization: Secondary | ICD-10-CM

## 2020-04-09 NOTE — Progress Notes (Signed)
   Covid-19 Vaccination Clinic  Name:  Margaret [name]    MRN: [id number] DOB: 08-18-1950  04/09/2020  [name] was observed post Covid-19 immunization for 15 minutes without incident. She was provided with Vaccine Information Sheet and instruction to access the V-Safe system.   [name] was instructed to call 911 with any severe reactions post vaccine: Marland Kitchen Difficulty breathing  . Swelling of face and throat  . A fast heartbeat  . A bad rash all over body  . Dizziness and weakness

## 2020-04-12 ENCOUNTER — Encounter: Payer: Self-pay | Admitting: Gastroenterology

## 2020-04-12 MED FILL — PFIZER-BIONTECH COVID-19 VA: 30 | 1 days supply | Qty: 0 | Fill #0

## 2020-05-04 ENCOUNTER — Telehealth: Payer: Self-pay | Admitting: Family Medicine

## 2020-05-04 DIAGNOSIS — R739 Hyperglycemia, unspecified: Secondary | ICD-10-CM

## 2020-05-04 DIAGNOSIS — E782 Mixed hyperlipidemia: Secondary | ICD-10-CM

## 2020-05-04 DIAGNOSIS — I10 Essential (primary) hypertension: Secondary | ICD-10-CM

## 2020-05-04 DIAGNOSIS — E559 Vitamin D deficiency, unspecified: Secondary | ICD-10-CM

## 2020-05-04 NOTE — Telephone Encounter (Signed)
Patient states Dr. Charlett Blake told her to stop taking crestor. Patient wants to know what is the next step

## 2020-05-05 NOTE — Telephone Encounter (Signed)
She was supposed to let us know if it helped her pain, if it did help she can choose to retry it and then if pain returns then we know we have to do something different. If she has already retried and failed then we can send her to lipid clinic. Happy to do a VV with her to discuss options.

## 2020-05-07 MED ORDER — LIVALO 1 MG PO TABS: ORAL_TABLET | ORAL | 2 refills | Status: DC

## 2020-05-07 NOTE — Telephone Encounter (Signed)
Spoke with patient and she stated that she has been off the crestor for about a month now and the pain is somewhat better but still there.  She is having pain and swelling in both legs from the knee down.  I advise her to be seen by PA but she declines at this time and wanted to only see you and wanted your recommendations.

## 2020-05-07 NOTE — Telephone Encounter (Signed)
Let's try her on Livalo 1 mg tabs, 1 tab po 2 days a week, disp #10 with 2 rf. Let her know it is also a statin but the newest one if she tolerates it we can avoid the lipid clinic  but if she does not we will have to send her to lipid clinic at cardiology. Make sure she has an appt or labs within 3 months

## 2020-05-07 NOTE — Addendum Note (Signed)
Addended by: Kem Boroughs D on: 05/07/2020 04:31 PM   Modules accepted: Orders

## 2020-05-07 NOTE — Telephone Encounter (Signed)
Patient is due to be seen in January.  She will try new medication and recheck labs in January and do her office visit the following week.

## 2020-05-10 ENCOUNTER — Telehealth: Payer: Self-pay | Admitting: Family Medicine

## 2020-05-10 ENCOUNTER — Other Ambulatory Visit: Payer: Self-pay | Admitting: Family Medicine

## 2020-05-10 MED ORDER — ROSUVASTATIN CALCIUM 10 MG PO TABS: 10.0000 mg | ORAL_TABLET | Freq: Every day | ORAL | 6 refills | Status: DC

## 2020-05-10 MED ORDER — ROSUVASTATIN CALCIUM 10 MG PO TABS: 10.0000 mg | ORAL_TABLET | Freq: Every day | ORAL | 2 refills | Status: DC

## 2020-05-10 NOTE — Telephone Encounter (Signed)
I will send the Crestor but let's have her just take it every other day until she is seen. I do not believe it is causing her symptoms.

## 2020-05-10 NOTE — Telephone Encounter (Signed)
Spoke with patient and she would like to start back the crestor (she never picked up the livalo).   She looked at the side effects of the Livalo and stated that they were similar to the crestor.  She wanted to know if it will be ok to start back the crestor until she sees you on 06/24/20?  She had several other questions about if her medications were ok with taking together and do they cause fatigue and elevated heart rate.  Advised her that this can be done at office visit in detail.  She wanted crestor sent in now but wanted [place] to mychart if you think it would be ok?

## 2020-05-10 NOTE — Telephone Encounter (Signed)
Patient states she has questions about her medication . Patient states she would like a call to discuss.    Pitavastatin Calcium (LIVALO) 1 MG TABS [[id number]]

## 2020-05-11 NOTE — Telephone Encounter (Signed)
Please call patient back she called @12 :32 PM

## 2020-05-11 NOTE — Telephone Encounter (Signed)
Left message on machine to call back  

## 2020-05-12 NOTE — Telephone Encounter (Signed)
Left detailed message on machine and to call us back if she has any further questions.

## 2020-06-16 ENCOUNTER — Other Ambulatory Visit: Payer: Self-pay

## 2020-06-16 ENCOUNTER — Other Ambulatory Visit (INDEPENDENT_AMBULATORY_CARE_PROVIDER_SITE_OTHER): Payer: Managed Care, Other (non HMO)

## 2020-06-16 DIAGNOSIS — E782 Mixed hyperlipidemia: Secondary | ICD-10-CM

## 2020-06-16 DIAGNOSIS — R739 Hyperglycemia, unspecified: Secondary | ICD-10-CM

## 2020-06-16 DIAGNOSIS — I10 Essential (primary) hypertension: Secondary | ICD-10-CM

## 2020-06-16 DIAGNOSIS — E559 Vitamin D deficiency, unspecified: Secondary | ICD-10-CM

## 2020-06-16 LAB — LIPID PANEL
Cholesterol: 154 mg/dL (ref 0–200)
HDL: 52 mg/dL (ref >39.00)
LDL Cholesterol: 75 mg/dL (ref 0–99)
NonHDL: 102.21
Total CHOL/HDL Ratio: 3
Triglycerides: 138 mg/dL (ref 0.0–149.0)
VLDL: 27.6 mg/dL (ref 0.0–40.0)

## 2020-06-16 LAB — COMPREHENSIVE METABOLIC PANEL
ALT: 55 U/L — ABNORMAL HIGH (ref 0–35)
AST: 40 U/L — ABNORMAL HIGH (ref 0–37)
Albumin: 4.3 g/dL (ref 3.5–5.2)
Alkaline Phosphatase: 79 U/L (ref 39–117)
BUN: 18 mg/dL (ref 6–23)
CO2: 29 mEq/L (ref 19–32)
Calcium: 9.3 mg/dL (ref 8.4–10.5)
Chloride: 103 mEq/L (ref 96–112)
Creatinine, Ser: 0.67 mg/dL (ref 0.40–1.20)
GFR: 90.65 mL/min (ref >60.00)
Glucose, Bld: 106 mg/dL — ABNORMAL HIGH (ref 70–99)
Potassium: 4.1 mEq/L (ref 3.5–5.1)
Sodium: 138 mEq/L (ref 135–145)
Total Bilirubin: 0.5 mg/dL (ref 0.2–1.2)
Total Protein: 6.5 g/dL (ref 6.0–8.3)

## 2020-06-16 LAB — VITAMIN D 25 HYDROXY (VIT D DEFICIENCY, FRACTURES): VITD: 41.32 ng/mL (ref 30.00–100.00)

## 2020-06-16 LAB — HEMOGLOBIN A1C: Hgb A1c MFr Bld: 6.3 % (ref 4.6–6.5)

## 2020-06-16 NOTE — Progress Notes (Signed)
lab

## 2020-06-24 ENCOUNTER — Encounter: Payer: Self-pay | Admitting: Family Medicine

## 2020-06-24 ENCOUNTER — Other Ambulatory Visit: Payer: Self-pay

## 2020-06-24 ENCOUNTER — Ambulatory Visit (INDEPENDENT_AMBULATORY_CARE_PROVIDER_SITE_OTHER): Payer: Managed Care, Other (non HMO) | Admitting: Family Medicine

## 2020-06-24 VITALS — BP 130/74 | HR 71 | Temp 98.2°F | Resp 18 | Wt 183.0 lb

## 2020-06-24 DIAGNOSIS — R252 Cramp and spasm: Secondary | ICD-10-CM

## 2020-06-24 DIAGNOSIS — K449 Diaphragmatic hernia without obstruction or gangrene: Secondary | ICD-10-CM

## 2020-06-24 DIAGNOSIS — Z9189 Other specified personal risk factors, not elsewhere classified: Secondary | ICD-10-CM

## 2020-06-24 DIAGNOSIS — E559 Vitamin D deficiency, unspecified: Secondary | ICD-10-CM

## 2020-06-24 DIAGNOSIS — E782 Mixed hyperlipidemia: Secondary | ICD-10-CM

## 2020-06-24 DIAGNOSIS — Z0000 Encounter for general adult medical examination without abnormal findings: Secondary | ICD-10-CM

## 2020-06-24 DIAGNOSIS — R0600 Dyspnea, unspecified: Secondary | ICD-10-CM

## 2020-06-24 DIAGNOSIS — L989 Disorder of the skin and subcutaneous tissue, unspecified: Secondary | ICD-10-CM

## 2020-06-24 DIAGNOSIS — K760 Fatty (change of) liver, not elsewhere classified: Secondary | ICD-10-CM

## 2020-06-24 DIAGNOSIS — Z780 Asymptomatic menopausal state: Secondary | ICD-10-CM

## 2020-06-24 DIAGNOSIS — R739 Hyperglycemia, unspecified: Secondary | ICD-10-CM

## 2020-06-24 DIAGNOSIS — Z1239 Encounter for other screening for malignant neoplasm of breast: Secondary | ICD-10-CM

## 2020-06-24 DIAGNOSIS — E2839 Other primary ovarian failure: Secondary | ICD-10-CM

## 2020-06-24 DIAGNOSIS — K219 Gastro-esophageal reflux disease without esophagitis: Secondary | ICD-10-CM

## 2020-06-24 NOTE — Assessment & Plan Note (Addendum)
Mildly elevated liver markers. Minimize simple carbohydrates and processed foods and monitor

## 2020-06-24 NOTE — Patient Instructions (Signed)
Hyland's leg cramp medicine, can get over the counter use under tongue for leg cramps Leg Cramps Leg cramps occur when one or more muscles tighten and a person has no control over it (involuntary muscle contraction). Muscle cramps are most common in the calf muscles of the leg. They can occur during exercise or at rest. Leg cramps are painful, and they may last for a few seconds to a few minutes. Cramps may return several times before they finally stop. Usually, leg cramps are not caused by a serious medical problem. In many cases, the cause is not known. Some common causes include:  Excessive physical effort (overexertion), such as during intense exercise.  Doing the same motion over and over.  Staying in a certain position for a long period of time.  Improper preparation, form, or technique while doing a sport or an activity.  Dehydration.  Injury.  Side effects of certain medicines.  Abnormally low levels of minerals in your blood (electrolytes), especially potassium and calcium. This could result from: ? Pregnancy. ? Taking diuretic medicines. Follow these instructions at home: Eating and drinking  Drink enough fluid to keep your urine pale yellow. Staying hydrated may help prevent cramps.  Eat a healthy diet that includes plenty of nutrients to help your muscles function. A healthy diet includes fruits and vegetables, lean protein, whole grains, and low-fat or nonfat dairy products. Managing pain, stiffness, and swelling  Try massaging, stretching, and relaxing the affected muscle. Do this for several minutes at a time.  If directed, put ice on areas that are sore or painful after a cramp. To do this: ? Put ice in a plastic bag. ? Place a towel between your skin and the bag. ? Leave the ice on for 20 minutes, 2-3 times a day. ? Remove the ice if your skin turns bright red. This is very important. If you cannot feel pain, heat, or cold, you have a greater risk of damage to the  area.  If directed, apply heat to muscles that are tense or tight. Do this before you exercise, or as often as told by your health care provider. Use the heat source that your health care provider recommends, such as a moist heat pack or a heating pad. To do this: ? Place a towel between your skin and the heat source. ? Leave the heat on for 20-30 minutes. ? Remove the heat if your skin turns bright red. This is especially important if you are unable to feel pain, heat, or cold. You may have a greater risk of getting burned.  Try taking hot showers or baths to help relax tight muscles.      General instructions  If you are having frequent leg cramps, avoid intense exercise for several days.  Take over-the-counter and prescription medicines only as told by your health care provider.  Keep all follow-up visits. This is important. Contact a health care provider if:  Your leg cramps get more severe or more frequent, or they do not improve over time.  Your foot becomes cold, numb, or blue. Summary  Muscle cramps can develop in any muscle, but the most common place is in the calf muscles of the leg.  Leg cramps are painful, and they may last for a few seconds to a few minutes.  Usually, leg cramps are not caused by a serious medical problem. Often, the cause is not known.  Stay hydrated, and take over-the-counter and prescription medicines only as told by your health  care provider. This information is not intended to replace advice given to you by your health care provider. Make sure you discuss any questions you have with your health care provider. Document Revised: 10/01/2019 Document Reviewed: 10/01/2019 Elsevier Patient Education  [place].

## 2020-06-24 NOTE — Assessment & Plan Note (Signed)
Doing fairly well but notes increased gas and burping in the morning. With drinking water

## 2020-06-24 NOTE — Assessment & Plan Note (Signed)
Supplement and monitor 

## 2020-06-24 NOTE — Assessment & Plan Note (Signed)
Worse at night and worse from knees down and on the left. Encouraged 60-80 ounces of water a day. Check labs, consider Hyland's leg cramp medicine prn, check labs

## 2020-06-25 NOTE — Assessment & Plan Note (Signed)
MGM ordered for screening

## 2020-06-25 NOTE — Assessment & Plan Note (Signed)
hgba1c acceptable, minimize simple carbs. Increase exercise as tolerated.  

## 2020-06-25 NOTE — Assessment & Plan Note (Signed)
Encouraged heart healthy diet, increase exercise, avoid trans fats, consider a krill oil cap daily. Is struggling with myalgias, can try and hold her statin for a few weeks and see if that helps.

## 2020-06-25 NOTE — Assessment & Plan Note (Signed)
Circular lesion with central sparing. Referred to dermatology for further evaluation.

## 2020-06-25 NOTE — Progress Notes (Signed)
Subjective:    Patient ID: Margaret [name], adult    DOB: 1950-07-23, 70 y.o.   MRN: [id number]  Chief Complaint  Patient presents with  . Follow-up    HPI Patient is in today for follow up on chronic medical concerns. No recent febrile illness or hospitalizations. She is noting persistent shortness of breath with exertion. No associated chest pain or palpitations. She is trying to stay active and maintain a heart healthy diet. She is struggling with some muslce cramps especially in her lower legs at night. Denies CP/palp/HA/congestion/fevers/GI or GU c/o. Taking meds as prescribed  Past Medical History:  Diagnosis Date  . Allergic state 03/25/2012  . Allergy   . Aortic calcification ([place]) 08/27/2014  . Atherosclerosis of aorta ([place]) 04/12/2014  . Back pain   . Constipation 01/23/2017  . GERD (gastroesophageal reflux disease)   . Hiatal hernia with gastroesophageal reflux 04/27/2010   Qualifier: Diagnosis of  By: Danelle Earthly CMA, Darlene  Failed Omeprazole and Protonix   . Hx of adenomatous polyp of colon 01/23/2017   Colonoscopy 01/16/2017 with 2 polyps one was adenomatous. recommeded follow up in 5 years.performed by Dr Silverio Decamp  . Hyperlipidemia   . Hypertension   . Joint pain   . Osteopenia 08/31/2012  . Peripheral edema 12/13/2014  . Sleep apnea   . Unspecified sinusitis (chronic) 03/30/2013    Past Surgical History:  Procedure Laterality Date  . [place] STUDY N/A 12/15/2015   Procedure: [place] STUDY;  Surgeon: Mauri Pole, MD;  Location: [facility];  Service: Endoscopy;  Laterality: N/A;  . DILATION AND CURETTAGE OF UTERUS    . ESOPHAGEAL MANOMETRY N/A 12/15/2015   Procedure: ESOPHAGEAL MANOMETRY (EM);  Surgeon: Mauri Pole, MD;  Location: [facility];  Service: Endoscopy;  Laterality: N/A;    Family History  Problem Relation Age of Onset  . Stroke Mother   . High blood pressure Mother   . High Cholesterol Mother   . Lung cancer Father   . Stroke  Sister        24  . Thyroid disease Sister   . Diabetes Sister   . Hypertension Sister   . Hyperlipidemia Sister   . Hypertension Sister   . Sleep apnea Son   . Obesity Son     Social History   Socioeconomic History  . Marital status: Married    Spouse name: [name]  . Number of children: 2  . Years of education: 17  . Highest education level: Not on file  Occupational History  . Occupation: Optometrist    Comment: retired  Tobacco Use  . Smoking status: Never Smoker  . Smokeless tobacco: Never Used  Vaping Use  . Vaping Use: Never used  Substance and Sexual Activity  . Alcohol use: Yes    Alcohol/week: 0.0 standard drinks    Comment: Rarely  . Drug use: No  . Sexual activity: Not Currently    Comment: no dietary restrictions, lives with husand, works at [employer]  Other Topics Concern  . Not on file  Social History Narrative   Lives with husband   Caffeine- tea, 2 cups daily   Social Determinants of Health   Financial Resource Strain: Not on file  Food Insecurity: Not on file  Transportation Needs: Not on file  Physical Activity: Not on file  Stress: Not on file  Social Connections: Not on file  Intimate Partner Violence: Not on file    Outpatient Medications Prior to Visit  Medication Sig Dispense Refill  . amLODipine (NORVASC) 5 MG tablet Take 1 tablet (5 mg total) by mouth daily. 90 tablet 0  . aspirin 81 MG chewable tablet Chew by mouth daily.    . fish oil-omega-3 fatty acids 1000 MG capsule Take 2 g by mouth daily.    . metoprolol succinate (TOPROL-XL) 50 MG 24 hr tablet Take 1 tablet (50 mg total) by mouth daily. Take with or immediately following a meal. 90 tablet 1  . Multiple Vitamin (MULTIVITAMIN WITH MINERALS) TABS tablet Take 1 tablet by mouth daily.    Marland Kitchen omeprazole (PRILOSEC) 40 MG capsule Take 1 capsule (40 mg total) by mouth daily. 90 capsule 0  . rosuvastatin (CRESTOR) 10 MG tablet Take 1 tablet (10 mg total) by mouth daily. 30 tablet 2  .  sucralfate (CARAFATE) 1 g tablet Take 1 tablet (1 g total) by mouth 4 (four) times daily -  with meals and at bedtime. 120 tablet 0  . VITAMIN D PO Take 2,500 Units by mouth daily.    . Zinc 50 MG TABS Take 1 tablet by mouth daily.    . Vitamin D, Ergocalciferol, (DRISDOL) 1.25 MG (50000 UNIT) CAPS capsule Take 1 capsule (50,000 Units total) by mouth every 7 (seven) days. 4 capsule 4   No facility-administered medications prior to visit.    Allergies  Allergen Reactions  . Lisinopril Cough  . Potassium-Containing Compounds Rash    Review of Systems  Constitutional: Negative for fever and malaise/fatigue.  HENT: Negative for congestion.   Eyes: Negative for blurred vision.  Respiratory: Positive for shortness of breath.   Cardiovascular: Negative for chest pain, palpitations and leg swelling.  Gastrointestinal: Negative for abdominal pain, blood in stool and nausea.  Genitourinary: Negative for dysuria and frequency.  Musculoskeletal: Negative for falls.  Skin: Negative for rash.  Neurological: Negative for dizziness, loss of consciousness and headaches.  Endo/Heme/Allergies: Negative for environmental allergies.  Psychiatric/Behavioral: Negative for depression. The patient is not nervous/anxious.        Objective:    Physical Exam Vitals and nursing note reviewed.  Constitutional:      General: She is not in acute distress.    Appearance: She is well-developed and well-nourished.  HENT:     Head: Normocephalic and atraumatic.     Nose: Nose normal.  Eyes:     General:        Right eye: No discharge.        Left eye: No discharge.  Cardiovascular:     Rate and Rhythm: Normal rate and regular rhythm.     Heart sounds: No murmur heard.   Pulmonary:     Effort: Pulmonary effort is normal.     Breath sounds: Normal breath sounds.  Abdominal:     General: Bowel sounds are normal.     Palpations: Abdomen is soft.     Tenderness: There is no abdominal tenderness.   Musculoskeletal:        General: No edema.     Cervical back: Normal range of motion and neck supple.  Skin:    General: Skin is warm and dry.  Neurological:     Mental Status: She is alert and oriented to person, place, and time.  Psychiatric:        Mood and Affect: Mood and affect normal.     BP 130/74   Pulse 71   Temp 98.2 F (36.8 C) (Oral)   Resp 18   Wt 183 lb (  83 kg)   SpO2 99%   BMI 35.74 kg/m  Wt Readings from Last 3 Encounters:  06/24/20 183 lb (83 kg)  03/15/20 179 lb (81.2 kg)  09/08/19 182 lb (82.6 kg)    Diabetic Foot Exam - Simple   No data filed    Lab Results  Component Value Date   WBC 5.3 03/15/2020   HGB 13.8 03/15/2020   HCT 41.3 03/15/2020   PLT 235 03/15/2020   GLUCOSE 106 (H) 06/16/2020   CHOL 154 06/16/2020   TRIG 138.0 06/16/2020   HDL 52.00 06/16/2020   LDLCALC 75 06/16/2020   ALT 55 (H) 06/16/2020   AST 40 (H) 06/16/2020   NA 138 06/16/2020   K 4.1 06/16/2020   CL 103 06/16/2020   CREATININE 0.67 06/16/2020   BUN 18 06/16/2020   CO2 29 06/16/2020   TSH 1.42 11/13/2019   HGBA1C 6.3 06/16/2020   MICROALBUR 3.06 (H) 05/16/2011    Lab Results  Component Value Date   TSH 1.42 11/13/2019   Lab Results  Component Value Date   WBC 5.3 03/15/2020   HGB 13.8 03/15/2020   HCT 41.3 03/15/2020   MCV 88.4 03/15/2020   PLT 235 03/15/2020   Lab Results  Component Value Date   NA 138 06/16/2020   K 4.1 06/16/2020   CO2 29 06/16/2020   GLUCOSE 106 (H) 06/16/2020   BUN 18 06/16/2020   CREATININE 0.67 06/16/2020   BILITOT 0.5 06/16/2020   ALKPHOS 79 06/16/2020   AST 40 (H) 06/16/2020   ALT 55 (H) 06/16/2020   PROT 6.5 06/16/2020   ALBUMIN 4.3 06/16/2020   CALCIUM 9.3 06/16/2020   ANIONGAP 6 11/27/2017   GFR 90.65 06/16/2020   Lab Results  Component Value Date   CHOL 154 06/16/2020   Lab Results  Component Value Date   HDL 52.00 06/16/2020   Lab Results  Component Value Date   LDLCALC 75 06/16/2020   Lab  Results  Component Value Date   TRIG 138.0 06/16/2020   Lab Results  Component Value Date   CHOLHDL 3 06/16/2020   Lab Results  Component Value Date   HGBA1C 6.3 06/16/2020       Assessment & Plan:   Problem List Items Addressed This Visit    Hyperlipidemia, mixed    Encouraged heart healthy diet, increase exercise, avoid trans fats, consider a krill oil cap daily. Is struggling with myalgias, can try and hold her statin for a few weeks and see if that helps.       Hiatal hernia with gastroesophageal reflux    Doing fairly well but notes increased gas and burping in the morning. With drinking water      Preventative health care    MGM ordered for screening      Hyperglycemia    hgba1c acceptable, minimize simple carbs. Increase exercise as tolerated.       Skin lesion of back    Circular lesion with central sparing. Referred to dermatology for further evaluation.       Relevant Orders   Ambulatory referral to Dermatology   Fatty infiltration of liver    Mildly elevated liver markers. Minimize simple carbohydrates and processed foods and monitor      Dyspnea - Primary    Will proceed with Echo to further evaluate. Encouraged to try and increase her activity level as tolerated.       Relevant Orders   ECHOCARDIOGRAM COMPLETE   Vitamin D deficiency  Supplement and monitor      Muscle cramps    Worse at night and worse from knees down and on the left. Encouraged 60-80 ounces of water a day. Check labs, consider Hyland's leg cramp medicine prn, check labs       Other Visit Diagnoses    Encounter for screening for malignant neoplasm of breast, unspecified screening modality       Estrogen deficiency       Post-menopausal       Relevant Orders   DG Bone Density   At high risk for breast cancer       Relevant Orders   MM 3D SCREEN BREAST BILATERAL      I have discontinued Margaret [name] "[name]"'s Vitamin D (Ergocalciferol). I am also having her maintain  her fish oil-omega-3 fatty acids, multivitamin with minerals, aspirin, VITAMIN D PO, Zinc, metoprolol succinate, omeprazole, sucralfate, amLODipine, and rosuvastatin.  No orders of the defined types were placed in this encounter.    Penni Homans, MD

## 2020-06-25 NOTE — Assessment & Plan Note (Signed)
Will proceed with Echo to further evaluate. Encouraged to try and increase her activity level as tolerated.

## 2020-06-28 ENCOUNTER — Other Ambulatory Visit: Payer: Self-pay | Admitting: Gastroenterology

## 2020-07-02 ENCOUNTER — Other Ambulatory Visit: Payer: Self-pay

## 2020-07-02 ENCOUNTER — Other Ambulatory Visit: Payer: Self-pay | Admitting: Family Medicine

## 2020-07-02 ENCOUNTER — Ambulatory Visit (HOSPITAL_BASED_OUTPATIENT_CLINIC_OR_DEPARTMENT_OTHER)
Admission: RE | Admit: 2020-07-02 | Discharge: 2020-07-02 | Disposition: A | Discharge status: Home / Self Care | Payer: Managed Care, Other (non HMO) | Source: Ambulatory Visit | Attending: Family Medicine | Admitting: Family Medicine

## 2020-07-02 DIAGNOSIS — R0600 Dyspnea, unspecified: Secondary | ICD-10-CM | POA: Insufficient documentation

## 2020-07-02 DIAGNOSIS — R0602 Shortness of breath: Secondary | ICD-10-CM | POA: Diagnosis not present

## 2020-07-02 LAB — ECHOCARDIOGRAM COMPLETE
Area-P 1/2: 3.19 cm2
S' Lateral: 3.18 cm

## 2020-07-13 ENCOUNTER — Telehealth: Payer: Self-pay | Admitting: Family Medicine

## 2020-07-13 NOTE — Telephone Encounter (Signed)
Patient states she is not in town. She just want medication.

## 2020-07-13 NOTE — Telephone Encounter (Signed)
Left message on machine to call back  

## 2020-07-13 NOTE — Telephone Encounter (Signed)
Patient states she is having urinary tract infection symptoms, Patient is having burning when peeing and when she pees a little will come out. Patient offered a visit but is out of town. Patient states she has been treating her UTI with drinking water and cranberry juice and no better.

## 2020-07-13 NOTE — Telephone Encounter (Signed)
I am willing to treat her for 3 days but if she does not get better she needs to be seen for a urine sample. Please confirm pharmacy and send her in 3 days of Cefdinir 300 mg po bid. Have her take AZO cranberry tabs and a probiotic daily as well.

## 2020-07-13 NOTE — Telephone Encounter (Signed)
Would you like for me to advise to go to an UC?

## 2020-07-14 ENCOUNTER — Telehealth: Payer: Self-pay | Admitting: *Deleted

## 2020-07-14 MED ORDER — CEFDINIR 300 MG PO CAPS: 300.0000 mg | ORAL_CAPSULE | Freq: Two times a day (BID) | ORAL | 0 refills | Status: AC

## 2020-07-14 NOTE — Telephone Encounter (Signed)
Spoke with patient and advised her of note below.  Rx sent in to [facility] [place] [phone]

## 2020-07-14 NOTE — Telephone Encounter (Signed)
Advised patient about the dermatology referral that we placed that they tried to call her to schedule.  She stated that she still would like referral to dermatology.  We advised her that she will need to call them to let them know she would like to schedule.  [facility] in [facility] [phone]

## 2020-07-20 ENCOUNTER — Telehealth: Payer: Self-pay | Admitting: Family Medicine

## 2020-07-20 ENCOUNTER — Other Ambulatory Visit: Payer: Self-pay

## 2020-07-20 DIAGNOSIS — R0600 Dyspnea, unspecified: Secondary | ICD-10-CM

## 2020-07-20 NOTE — Telephone Encounter (Signed)
Patient states she was given meds for UTI but still is in pain in her back and when she using bathroom. And irregular urination. She is requesting to see what else can be done

## 2020-07-20 NOTE — Telephone Encounter (Signed)
If she did not respond she needs to do a UA and culture for dysuria or have an appt for further evaluation

## 2020-07-20 NOTE — Telephone Encounter (Signed)
Pt will be coming to give a urine sample tomorrow

## 2020-07-20 NOTE — Telephone Encounter (Signed)
Lvm to call back

## 2020-07-21 ENCOUNTER — Other Ambulatory Visit: Payer: Self-pay

## 2020-07-21 ENCOUNTER — Other Ambulatory Visit (INDEPENDENT_AMBULATORY_CARE_PROVIDER_SITE_OTHER): Payer: Managed Care, Other (non HMO)

## 2020-07-21 DIAGNOSIS — R0600 Dyspnea, unspecified: Secondary | ICD-10-CM | POA: Diagnosis not present

## 2020-07-21 LAB — URINALYSIS
Bilirubin Urine: NEGATIVE
Hgb urine dipstick: NEGATIVE
Ketones, ur: NEGATIVE
Leukocytes,Ua: NEGATIVE
Nitrite: NEGATIVE
Specific Gravity, Urine: <=1.005 — AB (ref 1.000–1.030)
Total Protein, Urine: NEGATIVE
Urine Glucose: NEGATIVE
Urobilinogen, UA: 0.2 (ref 0.0–1.0)
pH: 6 (ref 5.0–8.0)

## 2020-07-22 ENCOUNTER — Other Ambulatory Visit: Payer: Self-pay | Admitting: Family Medicine

## 2020-07-22 LAB — URINE CULTURE
MICRO NUMBER:: 11569486
Result:: NO GROWTH
SPECIMEN QUALITY:: ADEQUATE

## 2020-07-27 ENCOUNTER — Telehealth (INDEPENDENT_AMBULATORY_CARE_PROVIDER_SITE_OTHER): Payer: Managed Care, Other (non HMO) | Admitting: Family Medicine

## 2020-07-27 ENCOUNTER — Other Ambulatory Visit: Payer: Self-pay

## 2020-07-27 DIAGNOSIS — N390 Urinary tract infection, site not specified: Secondary | ICD-10-CM

## 2020-07-27 DIAGNOSIS — R739 Hyperglycemia, unspecified: Secondary | ICD-10-CM

## 2020-07-27 DIAGNOSIS — E782 Mixed hyperlipidemia: Secondary | ICD-10-CM

## 2020-07-27 DIAGNOSIS — E559 Vitamin D deficiency, unspecified: Secondary | ICD-10-CM | POA: Diagnosis not present

## 2020-07-27 DIAGNOSIS — I10 Essential (primary) hypertension: Secondary | ICD-10-CM | POA: Diagnosis not present

## 2020-07-27 MED ORDER — AMLODIPINE BESYLATE 5 MG PO TABS: 2.5000 mg | ORAL_TABLET | Freq: Every day | ORAL | 2 refills | Status: DC

## 2020-07-27 MED ORDER — NITROFURANTOIN MONOHYD MACRO 100 MG PO CAPS: 100.0000 mg | ORAL_CAPSULE | Freq: Two times a day (BID) | ORAL | 1 refills | Status: DC

## 2020-07-27 MED ORDER — METOPROLOL SUCCINATE ER 25 MG PO TB24: 25.0000 mg | ORAL_TABLET | Freq: Every day | ORAL | 2 refills | Status: DC

## 2020-08-01 DIAGNOSIS — N390 Urinary tract infection, site not specified: Secondary | ICD-10-CM | POA: Insufficient documentation

## 2020-08-01 NOTE — Assessment & Plan Note (Signed)
Supplement and monitor 

## 2020-08-01 NOTE — Progress Notes (Signed)
Virtual Visit via Video Note  I connected with [name] on 07/27/20 at  1:20 PM EST by a video enabled telemedicine application and verified that I am speaking with the correct person using two identifiers.  Location: Patient: home, patient and provider are in visit Provider: home   I discussed the limitations of evaluation and management by telemedicine and the availability of in person appointments. The patient expressed understanding and agreed to proceed. S Chism, CMA was able to get the patient set upon a video visit    Subjective:    Patient ID: Margaret [name], adult    DOB: 10-19-1950, 70 y.o.   MRN: [id number]  Chief Complaint  Patient presents with  . Urine issues    HPI Patient is in today for follow up on chronic medical concerns. She had a recent UTI with urinary frequency, urgency, abdominal pain and some leg swelling but no fevers or chills. Her blood pressures have done better recently. Denies CP/palp/SOB/HA/congestion/fevers/GI c/o. Taking meds as prescribed  Past Medical History:  Diagnosis Date  . Allergic state 03/25/2012  . Allergy   . Aortic calcification ([place]) 08/27/2014  . Atherosclerosis of aorta ([place]) 04/12/2014  . Back pain   . Constipation 01/23/2017  . GERD (gastroesophageal reflux disease)   . Hiatal hernia with gastroesophageal reflux 04/27/2010   Qualifier: Diagnosis of  By: Danelle Earthly CMA, Darlene  Failed Omeprazole and Protonix   . Hx of adenomatous polyp of colon 01/23/2017   Colonoscopy 01/16/2017 with 2 polyps one was adenomatous. recommeded follow up in 5 years.performed by Dr Silverio Decamp  . Hyperlipidemia   . Hypertension   . Joint pain   . Osteopenia 08/31/2012  . Peripheral edema 12/13/2014  . Sleep apnea   . Unspecified sinusitis (chronic) 03/30/2013    Past Surgical History:  Procedure Laterality Date  . [place] STUDY N/A 12/15/2015   Procedure: [place] STUDY;  Surgeon: Mauri Pole, MD;  Location: [facility];  Service:  Endoscopy;  Laterality: N/A;  . DILATION AND CURETTAGE OF UTERUS    . ESOPHAGEAL MANOMETRY N/A 12/15/2015   Procedure: ESOPHAGEAL MANOMETRY (EM);  Surgeon: Mauri Pole, MD;  Location: [facility];  Service: Endoscopy;  Laterality: N/A;    Family History  Problem Relation Age of Onset  . Stroke Mother   . High blood pressure Mother   . High Cholesterol Mother   . Lung cancer Father   . Stroke Sister        20  . Thyroid disease Sister   . Diabetes Sister   . Hypertension Sister   . Hyperlipidemia Sister   . Hypertension Sister   . Sleep apnea Son   . Obesity Son     Social History   Socioeconomic History  . Marital status: Married    Spouse name: [name]  . Number of children: 2  . Years of education: 22  . Highest education level: Not on file  Occupational History  . Occupation: Optometrist    Comment: retired  Tobacco Use  . Smoking status: Never Smoker  . Smokeless tobacco: Never Used  Vaping Use  . Vaping Use: Never used  Substance and Sexual Activity  . Alcohol use: Yes    Alcohol/week: 0.0 standard drinks    Comment: Rarely  . Drug use: No  . Sexual activity: Not Currently    Comment: no dietary restrictions, lives with husand, works at [employer]  Other Topics Concern  . Not on file  Social History  Narrative   Lives with husband   Caffeine- tea, 2 cups daily   Social Determinants of Health   Financial Resource Strain: Not on file  Food Insecurity: Not on file  Transportation Needs: Not on file  Physical Activity: Not on file  Stress: Not on file  Social Connections: Not on file  Intimate Partner Violence: Not on file    Outpatient Medications Prior to Visit  Medication Sig Dispense Refill  . aspirin 81 MG chewable tablet Chew by mouth daily.    . fish oil-omega-3 fatty acids 1000 MG capsule Take 2 g by mouth daily.    . Multiple Vitamin (MULTIVITAMIN WITH MINERALS) TABS tablet Take 1 tablet by mouth daily.    Marland Kitchen omeprazole (PRILOSEC) 40 MG capsule  Take 1 capsule (40 mg total) by mouth daily. 90 capsule 0  . rosuvastatin (CRESTOR) 10 MG tablet Take 1 tablet (10 mg total) by mouth daily. 90 tablet 2  . sucralfate (CARAFATE) 1 g tablet TAKE 1 TABLET (1 G TOTAL) BY MOUTH 4 (FOUR) TIMES DAILY - WITH MEALS AND AT BEDTIME. 120 tablet 0  . VITAMIN D PO Take 2,500 Units by mouth daily.    . Zinc 50 MG TABS Take 1 tablet by mouth daily.    Marland Kitchen amLODipine (NORVASC) 5 MG tablet TAKE 1 TABLET BY MOUTH EVERY DAY 30 tablet 2  . metoprolol succinate (TOPROL-XL) 50 MG 24 hr tablet Take 1 tablet (50 mg total) by mouth daily. Take with or immediately following a meal. 90 tablet 1   No facility-administered medications prior to visit.    Allergies  Allergen Reactions  . Lisinopril Cough  . Potassium-Containing Compounds Rash    Review of Systems  Constitutional: Negative for fever and malaise/fatigue.  HENT: Negative for congestion.   Eyes: Negative for blurred vision.  Respiratory: Negative for shortness of breath.   Cardiovascular: Positive for leg swelling. Negative for chest pain and palpitations.  Gastrointestinal: Negative for abdominal pain, blood in stool and nausea.  Genitourinary: Positive for frequency. Negative for dysuria.  Musculoskeletal: Negative for falls.  Skin: Negative for rash.  Neurological: Negative for dizziness, loss of consciousness and headaches.  Endo/Heme/Allergies: Negative for environmental allergies.  Psychiatric/Behavioral: Negative for depression. The patient is not nervous/anxious.        Objective:    Physical Exam Constitutional:      Appearance: Normal appearance. She is not ill-appearing.  HENT:     Head: Normocephalic and atraumatic.     Right Ear: External ear normal.     Left Ear: External ear normal.     Nose: Nose normal.  Pulmonary:     Effort: Pulmonary effort is normal.  Neurological:     Mental Status: She is alert and oriented to person, place, and time.  Psychiatric:        Behavior:  Behavior normal.     There were no vitals taken for this visit. Wt Readings from Last 3 Encounters:  06/24/20 183 lb (83 kg)  03/15/20 179 lb (81.2 kg)  09/08/19 182 lb (82.6 kg)    Diabetic Foot Exam - Simple   No data filed    Lab Results  Component Value Date   WBC 5.3 03/15/2020   HGB 13.8 03/15/2020   HCT 41.3 03/15/2020   PLT 235 03/15/2020   GLUCOSE 106 (H) 06/16/2020   CHOL 154 06/16/2020   TRIG 138.0 06/16/2020   HDL 52.00 06/16/2020   LDLCALC 75 06/16/2020   ALT 55 (H) 06/16/2020  AST 40 (H) 06/16/2020   NA 138 06/16/2020   K 4.1 06/16/2020   CL 103 06/16/2020   CREATININE 0.67 06/16/2020   BUN 18 06/16/2020   CO2 29 06/16/2020   TSH 1.42 11/13/2019   HGBA1C 6.3 06/16/2020   MICROALBUR 3.06 (H) 05/16/2011    Lab Results  Component Value Date   TSH 1.42 11/13/2019   Lab Results  Component Value Date   WBC 5.3 03/15/2020   HGB 13.8 03/15/2020   HCT 41.3 03/15/2020   MCV 88.4 03/15/2020   PLT 235 03/15/2020   Lab Results  Component Value Date   NA 138 06/16/2020   K 4.1 06/16/2020   CO2 29 06/16/2020   GLUCOSE 106 (H) 06/16/2020   BUN 18 06/16/2020   CREATININE 0.67 06/16/2020   BILITOT 0.5 06/16/2020   ALKPHOS 79 06/16/2020   AST 40 (H) 06/16/2020   ALT 55 (H) 06/16/2020   PROT 6.5 06/16/2020   ALBUMIN 4.3 06/16/2020   CALCIUM 9.3 06/16/2020   ANIONGAP 6 11/27/2017   GFR 90.65 06/16/2020   Lab Results  Component Value Date   CHOL 154 06/16/2020   Lab Results  Component Value Date   HDL 52.00 06/16/2020   Lab Results  Component Value Date   LDLCALC 75 06/16/2020   Lab Results  Component Value Date   TRIG 138.0 06/16/2020   Lab Results  Component Value Date   CHOLHDL 3 06/16/2020   Lab Results  Component Value Date   HGBA1C 6.3 06/16/2020       Assessment & Plan:   Problem List Items Addressed This Visit    Hyperlipidemia, mixed    Encouraged heart healthy diet, increase exercise, avoid trans fats, consider a  krill oil cap daily      Relevant Medications   amLODipine (NORVASC) 5 MG tablet   metoprolol succinate (TOPROL-XL) 25 MG 24 hr tablet   Hyperglycemia    hgba1c acceptable, minimize simple carbs. Increase exercise as tolerated.      Essential hypertension    Monitor and report any concerns.  no changes to meds. Encouraged heart healthy diet such as the DASH diet and exercise as tolerated.       Relevant Medications   amLODipine (NORVASC) 5 MG tablet   metoprolol succinate (TOPROL-XL) 25 MG 24 hr tablet   Obesity    Encouraged MIND diet, decrease po intake and increase exercise as tolerated. Needs 7-8 hours of sleep nightly. Avoid trans fats, eat small, frequent meals every 4-5 hours with lean proteins, complex carbs and healthy fats. Minimize simple carbs      Vitamin D deficiency    Supplement and monitor      UTI (urinary tract infection)    She was out of town recently and had urinary frequency, urgency, back and abdominal pain with increased hydration, cranberry juice and antibiotics she improved. She is given a prescription for Macrobid to use prn if symptoms return although she is advised to provide [place] with a urine sample if possible, use probiotics, AZO cranberry tabs      Relevant Medications   nitrofurantoin, macrocrystal-monohydrate, (MACROBID) 100 MG capsule      I have discontinued Kailea Rozak "[name]"'s metoprolol succinate. I have also changed her amLODipine. Additionally, I am having her start on nitrofurantoin (macrocrystal-monohydrate) and metoprolol succinate. Lastly, I am having her maintain her fish oil-omega-3 fatty acids, multivitamin with minerals, aspirin, VITAMIN D PO, Zinc, omeprazole, sucralfate, and rosuvastatin.  Meds ordered this encounter  Medications  .  nitrofurantoin, macrocrystal-monohydrate, (MACROBID) 100 MG capsule    Sig: Take 1 capsule (100 mg total) by mouth 2 (two) times daily.    Dispense:  14 capsule    Refill:  1  . amLODipine  (NORVASC) 5 MG tablet    Sig: Take 0.5 tablets (2.5 mg total) by mouth daily.    Dispense:  30 tablet    Refill:  2  . metoprolol succinate (TOPROL-XL) 25 MG 24 hr tablet    Sig: Take 1 tablet (25 mg total) by mouth daily.    Dispense:  30 tablet    Refill:  2    I discussed the assessment and treatment plan with the patient. The patient was provided an opportunity to ask questions and all were answered. The patient agreed with the plan and demonstrated an understanding of the instructions.   The patient was advised to call back or seek an in-person evaluation if the symptoms worsen or if the condition fails to improve as anticipated.  I provided 20 minutes of non-face-to-face time during this encounter.   Penni Homans, MD

## 2020-08-01 NOTE — Assessment & Plan Note (Signed)
Encouraged heart healthy diet, increase exercise, avoid trans fats, consider a krill oil cap daily 

## 2020-08-01 NOTE — Assessment & Plan Note (Signed)
Encouraged MIND diet, decrease po intake and increase exercise as tolerated. Needs 7-8 hours of sleep nightly. Avoid trans fats, eat small, frequent meals every 4-5 hours with lean proteins, complex carbs and healthy fats. Minimize simple carbs

## 2020-08-01 NOTE — Assessment & Plan Note (Signed)
She was out of town recently and had urinary frequency, urgency, back and abdominal pain with increased hydration, cranberry juice and antibiotics she improved. She is given a prescription for Macrobid to use prn if symptoms return although she is advised to provide [place] with a urine sample if possible, use probiotics, AZO cranberry tabs

## 2020-08-01 NOTE — Assessment & Plan Note (Signed)
Monitor and report any concerns. no changes to meds. Encouraged heart healthy diet such as the DASH diet and exercise as tolerated.  

## 2020-08-01 NOTE — Assessment & Plan Note (Signed)
hgba1c acceptable, minimize simple carbs. Increase exercise as tolerated.  

## 2020-09-18 ENCOUNTER — Other Ambulatory Visit: Payer: Self-pay | Admitting: Family Medicine

## 2020-10-19 ENCOUNTER — Other Ambulatory Visit: Payer: Self-pay | Admitting: Family Medicine

## 2020-10-26 ENCOUNTER — Other Ambulatory Visit: Payer: Self-pay

## 2020-10-26 ENCOUNTER — Encounter (HOSPITAL_BASED_OUTPATIENT_CLINIC_OR_DEPARTMENT_OTHER): Payer: Self-pay

## 2020-10-26 ENCOUNTER — Ambulatory Visit (HOSPITAL_BASED_OUTPATIENT_CLINIC_OR_DEPARTMENT_OTHER)
Admission: RE | Admit: 2020-10-26 | Discharge: 2020-10-26 | Disposition: A | Discharge status: Home / Self Care | Payer: Managed Care, Other (non HMO) | Source: Ambulatory Visit | Attending: Family Medicine | Admitting: Family Medicine

## 2020-10-26 DIAGNOSIS — Z9189 Other specified personal risk factors, not elsewhere classified: Secondary | ICD-10-CM | POA: Diagnosis present

## 2020-10-26 DIAGNOSIS — Z780 Asymptomatic menopausal state: Secondary | ICD-10-CM | POA: Insufficient documentation

## 2020-11-22 ENCOUNTER — Other Ambulatory Visit: Payer: Self-pay | Admitting: Family Medicine

## 2020-11-22 ENCOUNTER — Other Ambulatory Visit: Payer: Managed Care, Other (non HMO)

## 2020-11-25 ENCOUNTER — Other Ambulatory Visit: Payer: Self-pay

## 2020-11-25 ENCOUNTER — Other Ambulatory Visit (INDEPENDENT_AMBULATORY_CARE_PROVIDER_SITE_OTHER): Payer: Managed Care, Other (non HMO)

## 2020-11-25 ENCOUNTER — Other Ambulatory Visit: Payer: Self-pay | Admitting: Family Medicine

## 2020-11-25 DIAGNOSIS — E782 Mixed hyperlipidemia: Secondary | ICD-10-CM

## 2020-11-25 DIAGNOSIS — R739 Hyperglycemia, unspecified: Secondary | ICD-10-CM | POA: Diagnosis not present

## 2020-11-25 DIAGNOSIS — I10 Essential (primary) hypertension: Secondary | ICD-10-CM

## 2020-11-25 DIAGNOSIS — E559 Vitamin D deficiency, unspecified: Secondary | ICD-10-CM

## 2020-11-25 LAB — COMPREHENSIVE METABOLIC PANEL
ALT: 49 U/L — ABNORMAL HIGH (ref 0–35)
AST: 40 U/L — ABNORMAL HIGH (ref 0–37)
Albumin: 4.1 g/dL (ref 3.5–5.2)
Alkaline Phosphatase: 77 U/L (ref 39–117)
BUN: 17 mg/dL (ref 6–23)
CO2: 28 mEq/L (ref 19–32)
Calcium: 9.3 mg/dL (ref 8.4–10.5)
Chloride: 104 mEq/L (ref 96–112)
Creatinine, Ser: 0.68 mg/dL (ref 0.40–1.20)
GFR: 90.05 mL/min (ref >60.00)
Glucose, Bld: 100 mg/dL — ABNORMAL HIGH (ref 70–99)
Potassium: 4.1 mEq/L (ref 3.5–5.1)
Sodium: 139 mEq/L (ref 135–145)
Total Bilirubin: 0.6 mg/dL (ref 0.2–1.2)
Total Protein: 6.5 g/dL (ref 6.0–8.3)

## 2020-11-25 LAB — LIPID PANEL
Cholesterol: 156 mg/dL (ref 0–200)
HDL: 51.9 mg/dL (ref >39.00)
LDL Cholesterol: 85 mg/dL (ref 0–99)
NonHDL: 103.62
Total CHOL/HDL Ratio: 3
Triglycerides: 93 mg/dL (ref 0.0–149.0)
VLDL: 18.6 mg/dL (ref 0.0–40.0)

## 2020-11-25 LAB — CBC
HCT: 38.4 % (ref 36.0–46.0)
Hemoglobin: 12.8 g/dL (ref 12.0–15.0)
MCHC: 33.3 g/dL (ref 30.0–36.0)
MCV: 87.7 fl (ref 78.0–100.0)
Platelets: 215 10*3/uL (ref 150.0–400.0)
RBC: 4.37 Mil/uL (ref 3.87–5.11)
RDW: 13.9 % (ref 11.5–15.5)
WBC: 5.3 10*3/uL (ref 4.0–10.5)

## 2020-11-25 LAB — TSH: TSH: 1.22 u[IU]/mL (ref 0.35–5.50)

## 2020-11-25 LAB — HEMOGLOBIN A1C: Hgb A1c MFr Bld: 6.3 % (ref 4.6–6.5)

## 2020-11-25 LAB — VITAMIN D 25 HYDROXY (VIT D DEFICIENCY, FRACTURES): VITD: 32.75 ng/mL (ref 30.00–100.00)

## 2021-01-11 ENCOUNTER — Encounter: Payer: Self-pay | Admitting: Family Medicine

## 2021-01-11 ENCOUNTER — Ambulatory Visit (INDEPENDENT_AMBULATORY_CARE_PROVIDER_SITE_OTHER): Payer: Managed Care, Other (non HMO) | Admitting: Family Medicine

## 2021-01-11 ENCOUNTER — Ambulatory Visit (HOSPITAL_BASED_OUTPATIENT_CLINIC_OR_DEPARTMENT_OTHER)
Admission: RE | Admit: 2021-01-11 | Discharge: 2021-01-11 | Disposition: A | Discharge status: Home / Self Care | Payer: Managed Care, Other (non HMO) | Source: Ambulatory Visit | Attending: Family Medicine | Admitting: Family Medicine

## 2021-01-11 ENCOUNTER — Other Ambulatory Visit: Payer: Self-pay

## 2021-01-11 VITALS — BP 118/74 | HR 69 | Temp 97.9°F | Resp 16 | Ht 60.0 in | Wt 189.8 lb

## 2021-01-11 DIAGNOSIS — M25561 Pain in right knee: Secondary | ICD-10-CM | POA: Diagnosis present

## 2021-01-11 DIAGNOSIS — E782 Mixed hyperlipidemia: Secondary | ICD-10-CM | POA: Diagnosis not present

## 2021-01-11 DIAGNOSIS — R300 Dysuria: Secondary | ICD-10-CM | POA: Diagnosis not present

## 2021-01-11 DIAGNOSIS — R739 Hyperglycemia, unspecified: Secondary | ICD-10-CM

## 2021-01-11 DIAGNOSIS — E559 Vitamin D deficiency, unspecified: Secondary | ICD-10-CM

## 2021-01-11 DIAGNOSIS — I10 Essential (primary) hypertension: Secondary | ICD-10-CM | POA: Diagnosis not present

## 2021-01-11 DIAGNOSIS — Z23 Encounter for immunization: Secondary | ICD-10-CM

## 2021-01-11 DIAGNOSIS — Z0000 Encounter for general adult medical examination without abnormal findings: Secondary | ICD-10-CM

## 2021-01-11 DIAGNOSIS — M8580 Other specified disorders of bone density and structure, unspecified site: Secondary | ICD-10-CM

## 2021-01-11 DIAGNOSIS — Z86010 Personal history of colonic polyps: Secondary | ICD-10-CM

## 2021-01-11 LAB — URINALYSIS
Bilirubin Urine: NEGATIVE
Hgb urine dipstick: NEGATIVE
Ketones, ur: NEGATIVE
Leukocytes,Ua: NEGATIVE
Nitrite: NEGATIVE
Specific Gravity, Urine: 1.015 (ref 1.000–1.030)
Total Protein, Urine: NEGATIVE
Urine Glucose: NEGATIVE
Urobilinogen, UA: 0.2 (ref 0.0–1.0)
pH: 6 (ref 5.0–8.0)

## 2021-01-11 LAB — COMPREHENSIVE METABOLIC PANEL
ALT: 52 U/L — ABNORMAL HIGH (ref 0–35)
AST: 36 U/L (ref 0–37)
Albumin: 4.4 g/dL (ref 3.5–5.2)
Alkaline Phosphatase: 79 U/L (ref 39–117)
BUN: 15 mg/dL (ref 6–23)
CO2: 29 mEq/L (ref 19–32)
Calcium: 9.8 mg/dL (ref 8.4–10.5)
Chloride: 104 mEq/L (ref 96–112)
Creatinine, Ser: 0.71 mg/dL (ref 0.40–1.20)
GFR: 87.83 mL/min (ref >60.00)
Glucose, Bld: 94 mg/dL (ref 70–99)
Potassium: 4.2 mEq/L (ref 3.5–5.1)
Sodium: 141 mEq/L (ref 135–145)
Total Bilirubin: 0.6 mg/dL (ref 0.2–1.2)
Total Protein: 7.3 g/dL (ref 6.0–8.3)

## 2021-01-11 LAB — SEDIMENTATION RATE: Sed Rate: 41 mm/hr — ABNORMAL HIGH (ref 0–30)

## 2021-01-11 LAB — CBC WITH DIFFERENTIAL/PLATELET
Basophils Absolute: 0.1 10*3/uL (ref 0.0–0.1)
Basophils Relative: 0.9 % (ref 0.0–3.0)
Eosinophils Absolute: 0.3 10*3/uL (ref 0.0–0.7)
Eosinophils Relative: 4.6 % (ref 0.0–5.0)
HCT: 41.6 % (ref 36.0–46.0)
Hemoglobin: 13.7 g/dL (ref 12.0–15.0)
Lymphocytes Relative: 42.4 % (ref 12.0–46.0)
Lymphs Abs: 2.5 10*3/uL (ref 0.7–4.0)
MCHC: 33 g/dL (ref 30.0–36.0)
MCV: 88.2 fl (ref 78.0–100.0)
Monocytes Absolute: 0.5 10*3/uL (ref 0.1–1.0)
Monocytes Relative: 9 % (ref 3.0–12.0)
Neutro Abs: 2.6 10*3/uL (ref 1.4–7.7)
Neutrophils Relative %: 43.1 % (ref 43.0–77.0)
Platelets: 226 10*3/uL (ref 150.0–400.0)
RBC: 4.71 Mil/uL (ref 3.87–5.11)
RDW: 14.1 % (ref 11.5–15.5)
WBC: 5.9 10*3/uL (ref 4.0–10.5)

## 2021-01-11 MED ORDER — CARVEDILOL 6.25 MG PO TABS: 6.2500 mg | ORAL_TABLET | Freq: Two times a day (BID) | ORAL | 3 refills | Status: DC

## 2021-01-11 MED ORDER — AMOXICILLIN 500 MG PO CAPS: 500.0000 mg | ORAL_CAPSULE | Freq: Three times a day (TID) | ORAL | 0 refills | Status: DC

## 2021-01-11 NOTE — Progress Notes (Signed)
Patient ID: Margaret [name], adult    DOB: [birth year]  Age: 70 y.o. MRN: [id number]    Subjective:  Subjective  HPI Margaret [name] presents for office visit today for comprehensive physical exam today and follow up on management of chronic concerns. She reports that she has been having trouble with her bladder for the last 3 days. She experiences urgency, difficulty urinating, left flank pain, feels bloating, and pressure around her abdomen. She is traveling on the 9th of September. Denies CP/palp/SOB/HA/congestion/fevers or GI c/o. Taking meds as prescribed. Endorses taking vitamin D and calcium supplements.  She reports that her bp readings at home are around 139/70. She complains of bilateral LE edema. She states that her LE's feel heavy and worried it might be a side effect of amlodipine.  She fell while at [facility] last Saturday 8/13 on her right knee. She endorses that her right knee was swollen and painful last Saturday. She states that the ice pack has helped reduce some of the swelling, however she still experiences pain in right knee when walking but not when sitting. She also endorses pain in bilateral wrists.   Review of Systems  Constitutional:  Negative for chills, fatigue and fever.  HENT:  Negative for congestion, rhinorrhea, sinus pressure, sinus pain and sore throat.   Eyes:  Negative for pain.  Respiratory:  Negative for cough and shortness of breath.   Cardiovascular:  Positive for leg swelling. Negative for chest pain and palpitations.  Gastrointestinal:  Negative for abdominal pain, blood in stool, diarrhea, nausea and vomiting.  Genitourinary:  Positive for difficulty urinating, flank pain (left) and urgency. Negative for decreased urine volume, frequency, vaginal bleeding and vaginal discharge.  Musculoskeletal:  Negative for back pain.  Neurological:  Negative for headaches.   History Past Medical History:  Diagnosis Date   Allergic state 03/25/2012   Allergy     Aortic calcification ([place]) 08/27/2014   Atherosclerosis of aorta ([place]) 04/12/2014   Back pain    Constipation 01/23/2017   GERD (gastroesophageal reflux disease)    Hiatal hernia with gastroesophageal reflux 04/27/2010   Qualifier: Diagnosis of  By: Danelle Earthly CMA, Darlene  Failed Omeprazole and Protonix    Hx of adenomatous polyp of colon 01/23/2017   Colonoscopy 01/16/2017 with 2 polyps one was adenomatous. recommeded follow up in 5 years.performed by Dr Silverio Decamp   Hyperlipidemia    Hypertension    Joint pain    Osteopenia 08/31/2012   Peripheral edema 12/13/2014   Sleep apnea    Unspecified sinusitis (chronic) 03/30/2013    She has a past surgical history that includes Dilation and curettage of uterus; Esophageal manometry (N/A, 12/15/2015); and 24 hour ph study (N/A, 12/15/2015).   Her family history includes Diabetes in her sister; High Cholesterol in her mother; High blood pressure in her mother; Hyperlipidemia in her sister; Hypertension in her sister and sister; Lung cancer in her father; Obesity in her son; Sleep apnea in her son; Stroke in her mother and sister; Thyroid disease in her sister.She reports that she has never smoked. She has never used smokeless tobacco. She reports current alcohol use. She reports that she does not use drugs.  Current Outpatient Medications on File Prior to Visit  Medication Sig Dispense Refill   aspirin 81 MG chewable tablet Chew by mouth daily.     fish oil-omega-3 fatty acids 1000 MG capsule Take 2 g by mouth daily.     Multiple Vitamin (MULTIVITAMIN WITH MINERALS) TABS tablet Take 1 tablet by  mouth daily.     nitrofurantoin, macrocrystal-monohydrate, (MACROBID) 100 MG capsule Take 1 capsule (100 mg total) by mouth 2 (two) times daily. 14 capsule 1   omeprazole (PRILOSEC) 40 MG capsule Take 1 capsule (40 mg total) by mouth daily. 90 capsule 0   rosuvastatin (CRESTOR) 10 MG tablet Take 1 tablet (10 mg total) by mouth daily. 90 tablet 2   sucralfate  (CARAFATE) 1 g tablet TAKE 1 TABLET (1 G TOTAL) BY MOUTH 4 (FOUR) TIMES DAILY - WITH MEALS AND AT BEDTIME. 120 tablet 0   VITAMIN D PO Take 2,500 Units by mouth daily.     Zinc 50 MG TABS Take 1 tablet by mouth daily.     COVID-19 mRNA vaccine, Pfizer, 30 MCG/0.3ML injection INJECT AS DIRECTED (Patient not taking: Reported on 01/11/2021) .3 mL 0   No current facility-administered medications on file prior to visit.     Objective:  Objective  Physical Exam Constitutional:      General: She is not in acute distress.    Appearance: Normal appearance. She is not ill-appearing or toxic-appearing.  HENT:     Head: Normocephalic and atraumatic.     Right Ear: Tympanic membrane, ear canal and external ear normal.     Left Ear: Tympanic membrane, ear canal and external ear normal.     Nose: No congestion or rhinorrhea.  Eyes:     Extraocular Movements: Extraocular movements intact.     Pupils: Pupils are equal, round, and reactive to light.  Cardiovascular:     Rate and Rhythm: Normal rate and regular rhythm.     Pulses: Normal pulses.     Heart sounds: Normal heart sounds. No murmur heard. Pulmonary:     Effort: Pulmonary effort is normal. No respiratory distress.     Breath sounds: Normal breath sounds. No wheezing, rhonchi or rales.  Abdominal:     General: Bowel sounds are normal.     Palpations: Abdomen is soft. There is no mass.     Tenderness: no abdominal tenderness There is no guarding.     Hernia: No hernia is present.  Musculoskeletal:        General: Normal range of motion.     Cervical back: Normal range of motion and neck supple.  Skin:    General: Skin is warm and dry.  Neurological:     Mental Status: She is alert and oriented to person, place, and time.     Motor: Motor function is intact. No weakness.  Psychiatric:        Behavior: Behavior normal.   BP 118/74   Pulse 69   Temp 97.9 F (36.6 C)   Resp 16   Ht 5' (1.524 m)   Wt 189 lb 12.8 oz (86.1 kg)    SpO2 97%   BMI 37.07 kg/m  Wt Readings from Last 3 Encounters:  01/11/21 189 lb 12.8 oz (86.1 kg)  06/24/20 183 lb (83 kg)  03/15/20 179 lb (81.2 kg)     Lab Results  Component Value Date   WBC 5.9 01/11/2021   HGB 13.7 01/11/2021   HCT 41.6 01/11/2021   PLT 226.0 01/11/2021   GLUCOSE 94 01/11/2021   CHOL 156 11/25/2020   TRIG 93.0 11/25/2020   HDL 51.90 11/25/2020   LDLCALC 85 11/25/2020   ALT 52 (H) 01/11/2021   AST 36 01/11/2021   NA 141 01/11/2021   K 4.2 01/11/2021   CL 104 01/11/2021   CREATININE 0.71 01/11/2021  BUN 15 01/11/2021   CO2 29 01/11/2021   TSH 1.22 11/25/2020   HGBA1C 6.3 11/25/2020   MICROALBUR 3.06 (H) 05/16/2011    DG Bone Density  Result Date: 10/26/2020 EXAM: DUAL X-RAY ABSORPTIOMETRY (DXA) FOR BONE MINERAL DENSITY IMPRESSION: [place] A Ronnesha Mester Your patient Naik completed a BMD test on 10/26/2020 using the [employer]mann (analysis version: 16.SP2) manufactured by [employer]. The following summarizes the results of our evaluation. [facility] PATIENT: Name: Adema, Patient ID: [id number] Birth Date: [birth year] Height: 60.7 in. Gender: Female Measured: 10/26/2020 Weight: 190.8 lbs. Indications: Asian, Estrogen Deficiency, Post Menopausal Fractures: Treatments: Calcium, Multivitamin, Vitamin D ASSESSMENT: The BMD measured at Femur Neck Left is 0.805 g/cm2 with a T-score of -1.7. This patient is considered osteopenic according to [facility]) criteria. The scan quality is good. Site Region Measured Date Measured Age WHO YA BMD Classification T-score AP Spine L1-L4 10/26/[age] 67.4 Osteopenia -1.2 1.041 g/cm2 DualFemur Neck Left 10/26/2020 67.4 years Osteopenia -1.7 0.805 g/cm2 Left Forearm Radius 33% 10/26/2020 67.4 Osteopenia -1.4 0.755 g/cm2 World Health Organization [facility]) criteria for post-menopausal, Caucasian Women: Normal       T-score at or above -1 SD Osteopenia   T-score between -1 and -2.5 SD Osteoporosis T-score at or below -2.5 SD  RECOMMENDATION: 1. All patients should optimize calcium and vitamin D intake. 2. Consider FDA-approved medical therapies in postmenopausal women and men aged 13 years and older, based on the following: a. A hip or vertebral(clinical or morphometric) fracture. b. T-Score < -2.5 at the femoral neck or spine after appropriate evaluation to exclude secondary causes c. Low bone mass (T-score between -1.0 and -2.5 at the femoral neck or spine) and a 10 year probability of a hip fracture >3% or a 10 year probability of major osteoporosis-related fracture > 20% based on the [place]-adapted WHO algorithm d. Clinical judgement and/or patient preferences may indicate treatment for people with 10-year fracture probabilities above or below these levels FOLLOW-UP: Patients with diagnosis of osteoporosis or at high risk for fracture should have regular bone mineral density tests. For patients eligible for Medicare, routine testing is allowed once every 2 years. The testing frequency can be increased to one year for patients who have rapidly progressing disease, those who are receiving or discontinuing medical therapy to restore bone mass, or have additional risk factors. I have reviewed this report and agree with the above findings. [facility] Patient: Kyung Bacca,     Referring Physician: Mosie Lukes Birth Date: [birth year] [age] Patient ID: [id number] Height: 60.7 in. Weight: 190.8 lbs. Measured: 10/26/2020 9:00:43 AM (16 SP 4) Gender: Female Ethnicity: Asian Analyzed: 10/26/2020 9:00:53 AM (16 SP 4) FRAX* 10-year Probability of Fracture Based on femoral neck BMD: DualFemur (Left) Major Osteoporotic Fracture: 5.1% Hip Fracture:                0.7% Population:                  [place] (Asian) Risk Factors:                None *FRAX is a Materials engineer of the [employer] for Metabolic Bone Disease, a [place] (WHO) [facility]. ASSESSMENT: The probability of a  major osteoporotic fracture is 5.1% within the next ten years. The probability of a hip fracture is 0.7% within the next ten years. Electronically Signed   By: Lowella Grip III M.D.  On: 10/26/2020 10:29   MM 3D SCREEN BREAST BILATERAL  Result Date: 10/26/2020 CLINICAL DATA:  Screening. EXAM: DIGITAL SCREENING BILATERAL MAMMOGRAM WITH TOMOSYNTHESIS AND CAD TECHNIQUE: Bilateral screening digital craniocaudal and mediolateral oblique mammograms were obtained. Bilateral screening digital breast tomosynthesis was performed. The images were evaluated with computer-aided detection. COMPARISON:  Previous exam(s). ACR Breast Density Category b: There are scattered areas of fibroglandular density. FINDINGS: There are no findings suspicious for malignancy. The images were evaluated with computer-aided detection. IMPRESSION: No mammographic evidence of malignancy. A result letter of this screening mammogram will be mailed directly to the patient. RECOMMENDATION: Screening mammogram in one year. (Code:SM-B-01Y) BI-RADS CATEGORY  1: Negative. Electronically Signed   By: Fidela Salisbury M.D.   On: 10/26/2020 16:25     Assessment & Plan:  Plan    Meds ordered this encounter  Medications   carvedilol (COREG) 6.25 MG tablet    Sig: Take 1 tablet (6.25 mg total) by mouth 2 (two) times daily with a meal.    Dispense:  60 tablet    Refill:  3   amoxicillin (AMOXIL) 500 MG capsule    Sig: Take 1 capsule (500 mg total) by mouth 3 (three) times daily for 10 days.    Dispense:  15 capsule    Refill:  0    Problem List Items Addressed This Visit     Hyperlipidemia, mixed    Encourage heart healthy diet such as MIND or DASH diet, increase exercise, avoid trans fats, simple carbohydrates and processed foods, consider a krill or fish or flaxseed oil cap daily. Tolerating rosuvastatin      Relevant Medications   carvedilol (COREG) 6.25 MG tablet   Knee pain    Recently fell and injured her right knee  recently. Notes some swelling, Encouraged moist heat and gentle stretching as tolerated. May try NSAIDs and prescription meds as directed and report if symptoms worsen or seek immediate care. Xray ordered      Relevant Orders   DG Knee Complete 4 Views Right (Completed)   Osteopenia    Encouraged to get adequate exercise, calcium and vitamin d intake      Preventative health care    Patient encouraged to maintain heart healthy diet, regular exercise, adequate sleep. Consider daily probiotics. Take medications as prescribed. Labs ordered and reviewed. MGM was 09/2020 repeat in 1 year. Last colonoscopy 2021 repeat in 2026. Pap with OB/GYN. Dexa was May 2022 repeat in 3-5 years      Hyperglycemia - Primary    hgba1c acceptable, minimize simple carbs. Increase exercise as tolerated.       Relevant Orders   Hemoglobin A1c   Dysuria    Check UA and culture and start Amoxicillin       Relevant Orders   Urinalysis (Completed)   Urine Culture (Completed)   Sedimentation rate (Completed)   Essential hypertension    Well controlled, change ot Cervedilol 6.25 mg bid. Encouraged heart healthy diet such as the DASH diet and exercise as tolerated.       Relevant Medications   carvedilol (COREG) 6.25 MG tablet   Other Relevant Orders   CBC with Differential/Platelet (Completed)   Comprehensive metabolic panel (Completed)   Hx of colonic polyps    Colonoscopy 2021 not pre cancerous repeat in 5 years.       Vitamin D deficiency    Supplement and monitor      Relevant Orders   Vitamin D 1,25 dihydroxy   Other  Visit Diagnoses     Need for pneumococcal vaccination       Relevant Orders   Pneumococcal conjugate vaccine 13-valent (Completed)       Follow-up: Return in about 4 weeks (around 02/08/2021), or 4 wk nurse appt for bp check then 3 month lab appt followed by appt with MD after that.  I, Suezanne Jacquet, acting as a scribe for Penni Homans, MD, have documented all relevent  documentation on behalf of Penni Homans, MD, as directed by Penni Homans, MD while in the presence of Penni Homans, MD.  I, Mosie Lukes, MD personally performed the services described in this documentation. All medical record entries made by the scribe were at my direction and in my presence. I have reviewed the chart and agree that the record reflects my personal performance and is accurate and complete

## 2021-01-11 NOTE — Patient Instructions (Signed)
You PCV 13 today (Prevnar 13) you will need PCV 23 (Pneumovax) in about a year  COVID 2 nd booster when willing at pharmacy  Shingrix is the new shingles shot, 2 shots over 2-6 months, confirm coverage with insurance and document, then can return here for shots with nurse appt or at [facility] shots by 2 week intervals  Preventive Care 68 Years and Older, Female Preventive care refers to lifestyle choices and visits with your health care provider that can promote health and wellness. This includes: A yearly physical exam. This is also called an annual wellness visit. Regular dental and eye exams. Immunizations. Screening for certain conditions. Healthy lifestyle choices, such as: Eating a healthy diet. Getting regular exercise. Not using drugs or products that contain nicotine and tobacco. Limiting alcohol use. What can I expect for my preventive care visit? Physical exam Your health care provider will check your: Height and weight. These may be used to calculate your BMI (body mass index). BMI is a measurement that tells if you are at a healthy weight. Heart rate and blood pressure. Body temperature. Skin for abnormal spots. Counseling Your health care provider may ask you questions about your: Past medical problems. Family's medical history. Alcohol, tobacco, and drug use. Emotional well-being. Home life and relationship well-being. Sexual activity. Diet, exercise, and sleep habits. History of falls. Memory and ability to understand (cognition). Work and work Statistician. Pregnancy and menstrual history. Access to firearms. What immunizations do I need?  Vaccines are usually given at various ages, according to a schedule. Your health care provider will recommend vaccines for you based on your age, medicalhistory, and lifestyle or other factors, such as travel or where you work. What tests do I need? Blood tests Lipid and cholesterol levels. These may be checked  every 5 years, or more often depending on your overall health. Hepatitis C test. Hepatitis B test. Screening Lung cancer screening. You may have this screening every year starting at age 68 if you have a 30-pack-year history of smoking and currently smoke or have quit within the past 15 years. Colorectal cancer screening. All adults should have this screening starting at age 68 and continuing until age 68. Your health care provider may recommend screening at age 68 if you are at increased risk. You will have tests every 1-10 years, depending on your results and the type of screening test. Diabetes screening. This is done by checking your blood sugar (glucose) after you have not eaten for a while (fasting). You may have this done every 1-3 years. Mammogram. This may be done every 1-2 years. Talk with your health care provider about how often you should have regular mammograms. Abdominal aortic aneurysm (AAA) screening. You may need this if you are a current or former smoker. BRCA-related cancer screening. This may be done if you have a family history of breast, ovarian, tubal, or peritoneal cancers. Other tests STD (sexually transmitted disease) testing, if you are at risk. Bone density scan. This is done to screen for osteoporosis. You may have this done starting at age 68. Talk with your health care provider about your test results, treatment options,and if necessary, the need for more tests. Follow these instructions at home: Eating and drinking  Eat a diet that includes fresh fruits and vegetables, whole grains, lean protein, and low-fat dairy products. Limit your intake of foods with high amounts of sugar, saturated fats, and salt. Take vitamin and mineral supplements as recommended by your health care  provider. Do not drink alcohol if your health care provider tells you not to drink. If you drink alcohol: Limit how much you have to 0-1 drink a day. Be aware of how much alcohol is in  your drink. In the [place] one drink equals one 12 oz bottle of beer (355 mL), one 5 oz glass of wine (148 mL), or one 1 oz glass of hard liquor (44 mL).  Lifestyle Take daily care of your teeth and gums. Brush your teeth every morning and night with fluoride toothpaste. Floss one time each day. Stay active. Exercise for at least 30 minutes 5 or more days each week. Do not use any products that contain nicotine or tobacco, such as cigarettes, e-cigarettes, and chewing tobacco. If you need help quitting, ask your health care provider. Do not use drugs. If you are sexually active, practice safe sex. Use a condom or other form of protection in order to prevent STIs (sexually transmitted infections). Talk with your health care provider about taking a low-dose aspirin or statin. Find healthy ways to cope with stress, such as: Meditation, yoga, or listening to music. Journaling. Talking to a trusted person. Spending time with friends and family. Safety Always wear your seat belt while driving or riding in a vehicle. Do not drive: If you have been drinking alcohol. Do not ride with someone who has been drinking. When you are tired or distracted. While texting. Wear a helmet and other protective equipment during sports activities. If you have firearms in your house, make sure you follow all gun safety procedures. What's next? Visit your health care provider once a year for an annual wellness visit. Ask your health care provider how often you should have your eyes and teeth checked. Stay up to date on all vaccines. This information is not intended to replace advice given to you by your health care provider. Make sure you discuss any questions you have with your healthcare provider. Document Revised: 05/05/2020 Document Reviewed: 05/09/2018 Elsevier Patient Education  2022 [employer].

## 2021-01-12 ENCOUNTER — Telehealth: Payer: Self-pay

## 2021-01-12 ENCOUNTER — Other Ambulatory Visit: Payer: Self-pay

## 2021-01-12 ENCOUNTER — Other Ambulatory Visit: Payer: Self-pay | Admitting: Family Medicine

## 2021-01-12 DIAGNOSIS — M25561 Pain in right knee: Secondary | ICD-10-CM

## 2021-01-12 DIAGNOSIS — R700 Elevated erythrocyte sedimentation rate: Secondary | ICD-10-CM

## 2021-01-16 NOTE — Assessment & Plan Note (Signed)
Recently fell and injured her right knee recently. Notes some swelling, Encouraged moist heat and gentle stretching as tolerated. May try NSAIDs and prescription meds as directed and report if symptoms worsen or seek immediate care. Xray ordered

## 2021-01-16 NOTE — Assessment & Plan Note (Signed)
hgba1c acceptable, minimize simple carbs. Increase exercise as tolerated.  

## 2021-01-16 NOTE — Assessment & Plan Note (Signed)
Supplement and monitor 

## 2021-01-16 NOTE — Assessment & Plan Note (Signed)
Colonoscopy 2021 not pre cancerous repeat in 5 years.

## 2021-01-16 NOTE — Assessment & Plan Note (Addendum)
Patient encouraged to maintain heart healthy diet, regular exercise, adequate sleep. Consider daily probiotics. Take medications as prescribed. Labs ordered and reviewed. MGM was 09/2020 repeat in 1 year. Last colonoscopy 2021 repeat in 2026. Pap with OB/GYN. Dexa was May 2022 repeat in 3-5 years

## 2021-01-16 NOTE — Assessment & Plan Note (Signed)
Check UA and culture and start Amoxicillin

## 2021-01-16 NOTE — Assessment & Plan Note (Addendum)
Well controlled, change ot Cervedilol 6.25 mg bid. Encouraged heart healthy diet such as the DASH diet and exercise as tolerated.

## 2021-01-16 NOTE — Assessment & Plan Note (Signed)
Encourage heart healthy diet such as MIND or DASH diet, increase exercise, avoid trans fats, simple carbohydrates and processed foods, consider a krill or fish or flaxseed oil cap daily. Tolerating rosuvastatin 

## 2021-01-16 NOTE — Assessment & Plan Note (Signed)
Encouraged to get adequate exercise, calcium and vitamin d intake 

## 2021-01-17 LAB — URINE CULTURE
MICRO NUMBER:: 12249007
SPECIMEN QUALITY:: ADEQUATE

## 2021-01-17 LAB — VITAMIN D 1,25 DIHYDROXY

## 2021-01-20 ENCOUNTER — Encounter: Payer: Self-pay | Admitting: Family Medicine

## 2021-01-20 ENCOUNTER — Other Ambulatory Visit: Payer: Self-pay

## 2021-01-20 ENCOUNTER — Ambulatory Visit (INDEPENDENT_AMBULATORY_CARE_PROVIDER_SITE_OTHER): Payer: Managed Care, Other (non HMO) | Admitting: Family Medicine

## 2021-01-20 ENCOUNTER — Ambulatory Visit: Payer: Self-pay

## 2021-01-20 VITALS — BP 138/64 | HR 61 | Ht 60.0 in | Wt 189.2 lb

## 2021-01-20 DIAGNOSIS — M25561 Pain in right knee: Secondary | ICD-10-CM

## 2021-01-20 DIAGNOSIS — M7041 Prepatellar bursitis, right knee: Secondary | ICD-10-CM

## 2021-01-20 NOTE — Patient Instructions (Addendum)
Thank you for coming in today.   Please use Voltaren gel (Generic Diclofenac Gel) up to 4x daily for pain as needed.  This is available over-the-counter as both the name brand Voltaren gel and the generic diclofenac gel.   I recommend you obtained a compression sleeve to help with your joint problems. There are many options on the market however I recommend obtaining a full knee Body Helix compression sleeve.  You can find information (including how to appropriate measure yourself for sizing) can be found at www.Body http://www.lambert.com/.  Many of these products are health savings account (HSA) eligible.   You can use the compression sleeve at any time throughout the day but is most important to use while being active as well as for 2 hours post-activity.   It is appropriate to ice following activity with the compression sleeve in place.   Do the exercises we reviewed.   Please perform the exercise program that we have prepared for you and gone over in detail on a daily basis.  In addition to the handout you were provided you can access your program through: [email or web address]   Your unique program code is:  [id number]  Let me know before the trip if you having trouble I can prescribe medicine.   Recheck in 6 weeks if not better.   I can also refer to physical therapy.   Prepatellar Bursitis  Prepatellar bursitis is inflammation of the prepatellar bursa, which is a fluid-filled sac that cushions the kneecap (patella). Prepatellar bursitis happens when fluid builds up in this sac and causes itto swell. The condition causes knee pain. What are the causes? This condition may be caused by: Constant pressure on the knees from kneeling. A hit to the knee. Falling on the knee. Infection from bacteria. Moving the knee often in a forceful way. What increases the risk? You are more likely to develop this condition if: You play sports that have a high risk of falling on the knee or being hit on the  knee. These include football, wrestling, basketball, or soccer. You do work in which you kneel for long periods of time, such as roofing, plumbing, or gardening. You have another inflammatory condition, such as gout or rheumatoid arthritis. What are the signs or symptoms? The most common symptom of this condition is knee pain that gets better with rest. Other symptoms include: Swelling on the front of the kneecap. Warmth in the knee. Tenderness with activity. Redness in the knee. Inability to bend the knee or to kneel. How is this diagnosed? This condition is diagnosed based on: A physical exam. Your health care provider will compare your knees and check for tenderness and pain while moving your knee. Your medical history. Tests to check for infection. These may include blood tests and tests on the fluid in the bursa. Imaging tests, such as X-ray, MRI, or ultrasound, to check for damage in the patella, or fluid buildup and swelling in the bursa. How is this treated? This condition may be treated by: Resting the knee. Putting ice on the knee. Taking medicines, such as: NSAIDs. These can help to reduce pain and swelling. Antibiotics. These may be needed if you have an infection. Steroids. These are used to reduce swelling and inflammation, and may be prescribed if other treatments are not helping. Raising (elevating) the knee while resting. Doing exercises to help you maintain movement (physical therapy). These may be recommended after pain and swelling improve. Having a procedure to remove fluid from the  bursa. This may be done if other treatments are not helping. Having surgery to remove the bursa. This may be done if you have a severe infection or if the condition keeps coming back after treatment. Follow these instructions at home: Medicines Take over-the-counter and prescription medicines only as told by your health care provider. If you were prescribed an antibiotic medicine, take  it as told by your health care provider. Do not stop taking the antibiotic even if you start to feel better. Managing pain, stiffness, and swelling  If directed, put ice on the injured area. Put ice in a plastic bag. Place a towel between your skin and the bag. Leave the ice on for 20 minutes, 2-3 times a day. Elevate the injured area above the level of your heart while you are sitting or lying down.  Activity Do not use the injured limb to support your body weight until your health care provider says that you can. Rest your knee. Avoid activities that cause knee pain. Return to your normal activities as told by your health care provider. Ask your health care provider what activities are safe for you. Do exercises as told by your health care provider. General instructions Ask your health care provider when it is safe for you to drive. Do not use any products that contain nicotine or tobacco, such as cigarettes, e-cigarettes, and chewing tobacco. These can delay healing. If you need help quitting, ask your health care provider. Keep all follow-up visits as told by your health care provider. This is important. How is this prevented? Warm up and stretch before being active. Cool down and stretch after being active. Give your body time to rest between periods of activity. Maintain physical fitness, including strength and flexibility. Be safe and responsible while being active. This will help you to avoid falls. Wear knee pads if you have to kneel for a long period of time. Contact a health care provider if: Your symptoms do not improve or get worse. Your symptoms keep coming back after treatment. You develop a fever and have warmth, redness, or swelling over your knee. Summary Prepatellar bursitis is inflammation of the prepatellar bursa, which is a fluid-filled sac that cushions the kneecap (patella). This condition may be caused by injury or constant pressure on the knee. It may also be  caused by an infection from bacteria. Symptoms of this condition include pain, swelling, warmth, and tenderness in the knee. Follow instructions from your health care provider about taking medicines, resting, and doing activities. Contact your health care provider if your symptoms do not improve, get worse, or keep coming back after treatment. This information is not intended to replace advice given to you by your health care provider. Make sure you discuss any questions you have with your healthcare provider. Document Revised: 09/06/2018 Document Reviewed: 07/25/2018 Elsevier Patient Education  [place].

## 2021-01-20 NOTE — Progress Notes (Signed)
I, Wendy Poet, LAT, ATC, am serving as scribe for Dr. Lynne Leader.  Subjective:    I'm seeing this patient as a consultation for: Dr. Penni Homans. Note will be routed back to referring provider/PCP.  CC: R knee pain  HPI: Pt is a 70 y/o female c/o R knee pain since 01/08/21 when she slipped and fell while shopping at [facility], landing on her R knee. Pt locates pain to her R ant knee.  R knee swelling: initially yes but not currently Mechanical symptoms: no Aggravates: kneeling; pressure to the ant knee Treatments tried: ice; pain-relieving cream/gel  Dx imaging: 01/11/21 R knee XR  Past medical history, Surgical history, Family history, Social history, Allergies, and medications have been entered into the medical record, reviewed.   Review of Systems: No new headache, visual changes, nausea, vomiting, diarrhea, constipation, dizziness, abdominal pain, skin rash, fevers, chills, night sweats, weight loss, swollen lymph nodes, body aches, joint swelling, muscle aches, chest pain, shortness of breath, mood changes, visual or auditory hallucinations.   Objective:    Vitals:   01/20/21 1308  BP: 138/64  Pulse: 61  SpO2: 96%   General: Well Developed, well nourished, and in no acute distress.   MSK: Right knee normal-appearing normal motion with crepitation.  Tender palpation anterior knee overlying patella and patellar tendon. Stable to commence exam.  Intact strength.  Some pain with resisted knee extension present.    Lab and Radiology Results  Diagnostic Limited MSK Ultrasound of: Right knee anterior knee Quad tendon intact normal-appearing until insertion of the patella were some hyperechoic change present consistent with calcific tendinopathy. Patella intact without obvious cortical defect. Patellar tendon intact Hypoechoic fluid trace seen in subcutaneous tissue superficial to the patella and patellar tendon consistent with prepatellar bursitis Impression: Prepatellar  bursitis   EXAM: RIGHT KNEE - COMPLETE 4+ VIEW   COMPARISON:  None.   FINDINGS: Osteopenia. No acute fracture or dislocation. Moderate degenerative changes of the medial compartment with joint space narrowing and osteophyte formation. Minimal degenerative changes of the lateral and patellofemoral compartments. No area of erosion or osseous destruction. No unexpected radiopaque foreign body. Mild soft tissue edema of the anterior subcutaneous tissues. Enthesophyte of the quadriceps tendon insertion on the patella.   IMPRESSION: No acute fracture or dislocation.     Electronically Signed   By: Valentino Saxon M.D.   On: 01/12/2021 07:48 I, Lynne Leader, personally (independently) visualized and performed the interpretation of the images attached in this note.   Impression and Recommendations:    Assessment and Plan: 70 y.o. adult with right anterior knee pain after a fall.  Pain due to prepatellar bursitis.  Plan for compression, Voltaren gel, and eccentric exercises taught in clinic today.  Patient is traveling in a few weeks.  Certainly can prescribe medicine prior to travel if needed.  She will let me know.  If not improved after 4 to 6 weeks certainly could proceed to injection even if needed.  Recheck back as needed or in 6 weeks.Marland Kitchen  PDMP not reviewed this encounter. Orders Placed This Encounter  Procedures   [place] LIMITED JOINT SPACE STRUCTURES LOW RIGHT(NO LINKED CHARGES)    Order Specific Question:   Reason for Exam (SYMPTOM  OR DIAGNOSIS REQUIRED)    Answer:   R knee pain    Order Specific Question:   Preferred imaging location?    Answer:   [place]   No orders of the defined types were placed in this  encounter.   Discussed warning signs or symptoms. Please see discharge instructions. Patient expresses understanding.   The above documentation has been reviewed and is accurate and complete Lynne Leader, M.D.

## 2021-01-22 ENCOUNTER — Other Ambulatory Visit: Payer: Self-pay | Admitting: Family Medicine

## 2021-01-26 ENCOUNTER — Ambulatory Visit (INDEPENDENT_AMBULATORY_CARE_PROVIDER_SITE_OTHER): Payer: Managed Care, Other (non HMO) | Admitting: Adult Health

## 2021-01-26 ENCOUNTER — Encounter: Payer: Self-pay | Admitting: Adult Health

## 2021-01-26 ENCOUNTER — Other Ambulatory Visit: Payer: Self-pay

## 2021-01-26 VITALS — BP 126/58 | HR 55 | Temp 98.3°F | Ht 60.0 in | Wt 192.4 lb

## 2021-01-26 DIAGNOSIS — G4733 Obstructive sleep apnea (adult) (pediatric): Secondary | ICD-10-CM

## 2021-01-26 NOTE — Assessment & Plan Note (Signed)
Healthy weight loss 

## 2021-01-26 NOTE — Patient Instructions (Addendum)
Continue on CPAP. Wear CPAP all night long Do not drive if sleepy Work on healthy weight Order for new CPAP machine .  Follow-up with Dr. Elsworth Soho  in 1 year and As needed

## 2021-01-26 NOTE — Assessment & Plan Note (Signed)
Excellent control and compliance on CPAP  Order for new CPAP machine .   Plan  Patient Instructions  Continue on CPAP. Wear CPAP all night long Do not drive if sleepy Work on healthy weight Order for new CPAP machine .  Follow-up with Dr. Elsworth Soho  in 1 year and As needed

## 2021-01-26 NOTE — Progress Notes (Signed)
$'@Patient'M$  ID: Margaret [name], adult    DOB: 1951/02/08, 70 y.o.   MRN: [id number]  Chief Complaint  Patient presents with   Follow-up    Referring provider: Mosie Lukes, MD  HPI: 70 year old female never smoker followed for obstructive sleep apnea Medical history significant for hypertension, CVA, coronary artery disease  TEST/EVENTS :  NPSG 05/2007 >> AHI 49/h, lowest desatn 71%, , corrected by CPAP 9 cm, placed on auto CPAP with nasal pillows   01/26/2021 Follow up : OSA  Patient presents for a follow-up visit.  She has underlying severe obstructive sleep apnea.  She has on nocturnal CPAP.  Patient says she has been doing better on CPAP.  She is trying to wear her CPAP more often.  Patient says it is helping with her daytime sleepiness.  Benefits from CPAP. Cant sleep without it , feels really bad if she falls asleep without it. Wears 5-6 hr each night .  Download shows good compliance at 87%, daily avg usage at 5hr. On AutoCPAP 5-15cm , daily avg pressure 10cmH2O. AHI 0.7.  Uses a nasal mask.  Needs a new machine, hers is getting old. Does not connect to internet any longer.     Allergies  Allergen Reactions   Lisinopril Cough   Potassium-Containing Compounds Rash    Immunization History  Administered Date(s) Administered   Fluad Quad(high Dose 65+) 02/11/2019, 03/15/2020   Influenza,inj,Quad PF,6+ Mos 03/24/2013, 04/07/2014, 02/11/2015, 06/01/2016, 03/29/2017, 04/30/2018   PFIZER(Purple Top)SARS-COV-2 Vaccination 07/19/2019, 08/12/2019, 04/09/2020   Pneumococcal Conjugate-13 04/10/2013, 01/11/2021   Tdap 05/29/2012    Past Medical History:  Diagnosis Date   Allergic state 03/25/2012   Allergy    Aortic calcification ([place]) 08/27/2014   Atherosclerosis of aorta ([place]) 04/12/2014   Back pain    Constipation 01/23/2017   GERD (gastroesophageal reflux disease)    Hiatal hernia with gastroesophageal reflux 04/27/2010   Qualifier: Diagnosis of  By: Danelle Earthly CMA, Darlene   Failed Omeprazole and Protonix    Hx of adenomatous polyp of colon 01/23/2017   Colonoscopy 01/16/2017 with 2 polyps one was adenomatous. recommeded follow up in 5 years.performed by Dr Silverio Decamp   Hyperlipidemia    Hypertension    Joint pain    Osteopenia 08/31/2012   Peripheral edema 12/13/2014   Sleep apnea    Unspecified sinusitis (chronic) 03/30/2013    Tobacco History: Social History   Tobacco Use  Smoking Status Never  Smokeless Tobacco Never   Counseling given: Not Answered   Outpatient Medications Prior to Visit  Medication Sig Dispense Refill   aspirin 81 MG chewable tablet Chew by mouth daily.     carvedilol (COREG) 6.25 MG tablet Take 1 tablet (6.25 mg total) by mouth 2 (two) times daily with a meal. 60 tablet 3   fish oil-omega-3 fatty acids 1000 MG capsule Take 2 g by mouth daily.     Multiple Vitamin (MULTIVITAMIN WITH MINERALS) TABS tablet Take 1 tablet by mouth daily.     omeprazole (PRILOSEC) 40 MG capsule Take 1 capsule (40 mg total) by mouth daily. 90 capsule 0   rosuvastatin (CRESTOR) 10 MG tablet Take 1 tablet (10 mg total) by mouth daily. 90 tablet 2   VITAMIN D PO Take 2,500 Units by mouth daily.     Zinc 50 MG TABS Take 1 tablet by mouth daily.     COVID-19 mRNA vaccine, Pfizer, 30 MCG/0.3ML injection INJECT AS DIRECTED (Patient not taking: No sig reported) .3 mL 0   nitrofurantoin,  macrocrystal-monohydrate, (MACROBID) 100 MG capsule Take 1 capsule (100 mg total) by mouth 2 (two) times daily. 14 capsule 1   sucralfate (CARAFATE) 1 g tablet TAKE 1 TABLET (1 G TOTAL) BY MOUTH 4 (FOUR) TIMES DAILY - WITH MEALS AND AT BEDTIME. 120 tablet 0   No facility-administered medications prior to visit.     Review of Systems:   Constitutional:   No  weight loss, night sweats,  Fevers, chills,  +fatigue, or  lassitude.  HEENT:   No headaches,  Difficulty swallowing,  Tooth/dental problems, or  Sore throat,                No sneezing, itching, ear ache, nasal  congestion, post nasal drip,   CV:  No chest pain,  Orthopnea, PND, swelling in lower extremities, anasarca, dizziness, palpitations, syncope.   GI  No heartburn, indigestion, abdominal pain, nausea, vomiting, diarrhea, change in bowel habits, loss of appetite, bloody stools.   Resp: No shortness of breath with exertion or at rest.  No excess mucus, no productive cough,  No non-productive cough,  No coughing up of blood.  No change in color of mucus.  No wheezing.  No chest wall deformity  Skin: no rash or lesions.  GU: no dysuria, change in color of urine, no urgency or frequency.  No flank pain, no hematuria   MS:  No joint pain or swelling.  No decreased range of motion.  No back pain.    Physical Exam  BP (!) 126/58 (BP Location: Left Arm, Patient Position: Sitting, Cuff Size: Large)   Pulse (!) 55   Temp 98.3 F (36.8 C) (Oral)   Ht 5' (1.524 m)   Wt 192 lb 6.4 oz (87.3 kg)   SpO2 98% Comment: RA  BMI 37.58 kg/m   GEN: A/Ox3; pleasant , NAD, well nourished    HEENT:  [place]/AT,   NOSE-clear, THROAT-clear, no lesions, no postnasal drip or exudate noted.  Class 2-3 MP airway   NECK:  Supple w/ fair ROM; no JVD; normal carotid impulses w/o bruits; no thyromegaly or nodules palpated; no lymphadenopathy.    RESP  Clear  P & A; w/o, wheezes/ rales/ or rhonchi. no accessory muscle use, no dullness to percussion  CARD:  RRR, no m/r/g, no peripheral edema, pulses intact, no cyanosis or clubbing.  GI:   Soft & nt; nml bowel sounds; no organomegaly or masses detected.   Musco: Warm bil, no deformities or joint swelling noted.   Neuro: alert, no focal deficits noted.    Skin: Warm, no lesions or rashes    Lab Results:  CBC    Component Value Date/Time   WBC 5.9 01/11/2021 1200   RBC 4.71 01/11/2021 1200   HGB 13.7 01/11/2021 1200   HCT 41.6 01/11/2021 1200   PLT 226.0 01/11/2021 1200   MCV 88.2 01/11/2021 1200   MCH 29.6 03/15/2020 1534   MCHC 33.0 01/11/2021 1200    RDW 14.1 01/11/2021 1200   LYMPHSABS 2.5 01/11/2021 1200   MONOABS 0.5 01/11/2021 1200   EOSABS 0.3 01/11/2021 1200   BASOSABS 0.1 01/11/2021 1200    BMET    Component Value Date/Time   NA 141 01/11/2021 1200   NA 142 04/03/2018 0838   K 4.2 01/11/2021 1200   CL 104 01/11/2021 1200   CO2 29 01/11/2021 1200   GLUCOSE 94 01/11/2021 1200   BUN 15 01/11/2021 1200   BUN 13 04/03/2018 0838   CREATININE 0.71 01/11/2021 1200  CREATININE 0.66 03/15/2020 1534   CALCIUM 9.8 01/11/2021 1200   GFRNONAA 93 04/03/2018 0838   GFRAA 107 04/03/2018 0838    BNP No results found for: BNP  ProBNP No results found for: PROBNP  Imaging: DG Knee Complete 4 Views Right  Result Date: 01/12/2021 CLINICAL DATA:  right knee pain and swelling after fall at [facility] onto knee on 8/13 EXAM: RIGHT KNEE - COMPLETE 4+ VIEW COMPARISON:  None. FINDINGS: Osteopenia. No acute fracture or dislocation. Moderate degenerative changes of the medial compartment with joint space narrowing and osteophyte formation. Minimal degenerative changes of the lateral and patellofemoral compartments. No area of erosion or osseous destruction. No unexpected radiopaque foreign body. Mild soft tissue edema of the anterior subcutaneous tissues. Enthesophyte of the quadriceps tendon insertion on the patella. IMPRESSION: No acute fracture or dislocation. Electronically Signed   By: Valentino Saxon M.D.   On: 01/12/2021 07:48      No flowsheet data found.  No results found for: NITRICOXIDE      Assessment & Plan:   Obstructive sleep apnea syndrome Excellent control and compliance on CPAP  Order for new CPAP machine .   Plan  Patient Instructions  Continue on CPAP. Wear CPAP all night long Do not drive if sleepy Work on healthy weight Order for new CPAP machine .  Follow-up with Dr. Elsworth Soho  in 1 year and As needed       Obesity Healthy weight loss.      Rexene Edison, NP 01/26/2021

## 2021-02-01 ENCOUNTER — Ambulatory Visit: Payer: Managed Care, Other (non HMO)

## 2021-02-02 ENCOUNTER — Other Ambulatory Visit: Payer: Self-pay | Admitting: Family Medicine

## 2021-02-14 ENCOUNTER — Telehealth: Payer: Self-pay | Admitting: Family Medicine

## 2021-02-14 NOTE — Telephone Encounter (Signed)
Pt is scheduled for 9/26

## 2021-02-14 NOTE — Telephone Encounter (Signed)
Pt called in regarding urinary tract infection. She's currently of out sate in [place] and needs medication prescribed. Advised pt to go to the nearest urgent care/hospital and check with insurance company for coverage. Pt stated Charlett Blake knows her circumstances and would like to speak with her. Please advise [phone].

## 2021-02-21 ENCOUNTER — Telehealth (INDEPENDENT_AMBULATORY_CARE_PROVIDER_SITE_OTHER): Payer: Managed Care, Other (non HMO) | Admitting: Family Medicine

## 2021-02-21 ENCOUNTER — Other Ambulatory Visit: Payer: Self-pay

## 2021-02-21 ENCOUNTER — Other Ambulatory Visit (INDEPENDENT_AMBULATORY_CARE_PROVIDER_SITE_OTHER): Payer: Managed Care, Other (non HMO)

## 2021-02-21 DIAGNOSIS — R700 Elevated erythrocyte sedimentation rate: Secondary | ICD-10-CM | POA: Diagnosis not present

## 2021-02-21 DIAGNOSIS — R609 Edema, unspecified: Secondary | ICD-10-CM

## 2021-02-21 DIAGNOSIS — R739 Hyperglycemia, unspecified: Secondary | ICD-10-CM

## 2021-02-21 DIAGNOSIS — E782 Mixed hyperlipidemia: Secondary | ICD-10-CM | POA: Diagnosis not present

## 2021-02-21 DIAGNOSIS — R1012 Left upper quadrant pain: Secondary | ICD-10-CM

## 2021-02-21 DIAGNOSIS — I10 Essential (primary) hypertension: Secondary | ICD-10-CM

## 2021-02-21 DIAGNOSIS — N390 Urinary tract infection, site not specified: Secondary | ICD-10-CM

## 2021-02-21 DIAGNOSIS — U071 COVID-19: Secondary | ICD-10-CM

## 2021-02-21 DIAGNOSIS — E559 Vitamin D deficiency, unspecified: Secondary | ICD-10-CM

## 2021-02-21 DIAGNOSIS — R600 Localized edema: Secondary | ICD-10-CM

## 2021-02-21 HISTORY — DX: COVID-19: U07.1

## 2021-02-21 LAB — SEDIMENTATION RATE: Sed Rate: 25 mm/hr (ref 0–30)

## 2021-02-21 NOTE — Assessment & Plan Note (Signed)
Monitor and report any concerns, no changes to meds. Encouraged heart healthy diet such as the DASH diet and exercise as tolerated.  ?

## 2021-02-21 NOTE — Assessment & Plan Note (Signed)
She had to take Augmentin bid with dysuria. She got it from Urgent Care in [place]. Increase hyudration

## 2021-02-21 NOTE — Assessment & Plan Note (Signed)
Supplement and monitor 

## 2021-02-21 NOTE — Assessment & Plan Note (Signed)
hgba1c acceptable, minimize simple carbs. Increase exercise as tolerated.  

## 2021-02-21 NOTE — Progress Notes (Signed)
MyChart Phone Visit    Virtual Visit via Phone Note   This visit type was conducted due to national recommendations for restrictions regarding the COVID-19 Pandemic (e.g. social distancing) in an effort to limit this patient's exposure and mitigate transmission in our community. This patient is at least at moderate risk for complications without adequate follow up. This format is felt to be most appropriate for this patient at this time. Physical exam was limited by quality of the video and audio technology used for the visit. Nena Alexander, CMA was able to get the patient set up on a video visit.  Patient location: Home Patient and provider in visit Provider location: Office  I discussed the limitations of evaluation and management by telemedicine and the availability of in person appointments. The patient expressed understanding and agreed to proceed.  Visit Date: 02/21/2021  Today's healthcare provider: Penni Homans, MD      Subjective:    Patient ID: Margaret [name], adult    DOB: 07/25/1950, 70 y.o.   MRN: [id number]  No chief complaint on file.   HPI Patient is in today for a follow-up.  She tested positive for Covid-19 on 02/12/2021 and started having symptoms on 02/09/2021.  She lost her sense of smell and was experiencing diarrhea. She also had a fever of 102, sore throat and cough. She managed the Covid-19 with mucinex and ibuprofen. She was at a wedding, took all the necessary precautions and was quarantined for 5 days. She also had a UTI and was treating with clavulanic acid  to manage the pelvic pain and mentions it provided little relief.  However, she is experiencing pain on the left side of her back and sometimes, it is hard to get up or walk. Lying down brings great relief but the pain is worsened when she gets up. She rates the pain as a 8/10, is located around her hip while radiating to the base of her spine and is waxing and waning.   Past Medical History:   Diagnosis Date   Allergic state 03/25/2012   Allergy    Aortic calcification ([place]) 08/27/2014   Atherosclerosis of aorta ([place]) 04/12/2014   Back pain    Constipation 01/23/2017   GERD (gastroesophageal reflux disease)    Hiatal hernia with gastroesophageal reflux 04/27/2010   Qualifier: Diagnosis of  By: Danelle Earthly CMA, Darlene  Failed Omeprazole and Protonix    Hx of adenomatous polyp of colon 01/23/2017   Colonoscopy 01/16/2017 with 2 polyps one was adenomatous. recommeded follow up in 5 years.performed by Dr Silverio Decamp   Hyperlipidemia    Hypertension    Joint pain    Osteopenia 08/31/2012   Peripheral edema 12/13/2014   Sleep apnea    Unspecified sinusitis (chronic) 03/30/2013    Past Surgical History:  Procedure Laterality Date   24 HOUR [place] STUDY N/A 12/15/2015   Procedure: 24 HOUR PH STUDY;  Surgeon: Mauri Pole, MD;  Location: [facility];  Service: Endoscopy;  Laterality: N/A;   DILATION AND CURETTAGE OF UTERUS     ESOPHAGEAL MANOMETRY N/A 12/15/2015   Procedure: ESOPHAGEAL MANOMETRY (EM);  Surgeon: Mauri Pole, MD;  Location: [facility];  Service: Endoscopy;  Laterality: N/A;    Family History  Problem Relation Age of Onset   Stroke Mother    High blood pressure Mother    High Cholesterol Mother    Lung cancer Father    Stroke Sister        30  Thyroid disease Sister    Diabetes Sister    Hypertension Sister    Hyperlipidemia Sister    Hypertension Sister    Sleep apnea Son    Obesity Son     Social History   Socioeconomic History   Marital status: Married    Spouse name: [name]   Number of children: 2   Years of education: 16   Highest education level: Not on file  Occupational History   Occupation: Optometrist    Comment: retired  Tobacco Use   Smoking status: Never   Smokeless tobacco: Never  Vaping Use   Vaping Use: Never used  Substance and Sexual Activity   Alcohol use: Yes    Alcohol/week: 0.0 standard drinks    Comment: Rarely    Drug use: No   Sexual activity: Not Currently    Comment: no dietary restrictions, lives with husand, works at [employer]  Other Topics Concern   Not on file  Social History Narrative   Lives with husband   Caffeine- tea, 2 cups daily   Social Determinants of Health   Financial Resource Strain: Not on file  Food Insecurity: Not on file  Transportation Needs: Not on file  Physical Activity: Not on file  Stress: Not on file  Social Connections: Not on file  Intimate Partner Violence: Not on file    Outpatient Medications Prior to Visit  Medication Sig Dispense Refill   aspirin 81 MG chewable tablet Chew by mouth daily.     carvedilol (COREG) 6.25 MG tablet Take 1 tablet (6.25 mg total) by mouth 2 (two) times daily with a meal. 60 tablet 3   fish oil-omega-3 fatty acids 1000 MG capsule Take 2 g by mouth daily.     Multiple Vitamin (MULTIVITAMIN WITH MINERALS) TABS tablet Take 1 tablet by mouth daily.     omeprazole (PRILOSEC) 40 MG capsule Take 1 capsule (40 mg total) by mouth daily. 90 capsule 0   rosuvastatin (CRESTOR) 10 MG tablet Take 1 tablet (10 mg total) by mouth daily. 90 tablet 2   VITAMIN D PO Take 2,500 Units by mouth daily.     Zinc 50 MG TABS Take 1 tablet by mouth daily.     No facility-administered medications prior to visit.    Allergies  Allergen Reactions   Lisinopril Cough   Potassium-Containing Compounds Rash    Review of Systems  Musculoskeletal:  Positive for back pain (left side).      Objective:    Physical Exam  There were no vitals taken for this visit. Wt Readings from Last 3 Encounters:  01/26/21 192 lb 6.4 oz (87.3 kg)  01/20/21 189 lb 3.2 oz (85.8 kg)  01/11/21 189 lb 12.8 oz (86.1 kg)    Diabetic Foot Exam - Simple   No data filed    Lab Results  Component Value Date   WBC 5.9 01/11/2021   HGB 13.7 01/11/2021   HCT 41.6 01/11/2021   PLT 226.0 01/11/2021   GLUCOSE 94 01/11/2021   CHOL 156 11/25/2020   TRIG 93.0 11/25/2020   HDL  51.90 11/25/2020   LDLCALC 85 11/25/2020   ALT 52 (H) 01/11/2021   AST 36 01/11/2021   NA 141 01/11/2021   K 4.2 01/11/2021   CL 104 01/11/2021   CREATININE 0.71 01/11/2021   BUN 15 01/11/2021   CO2 29 01/11/2021   TSH 1.22 11/25/2020   HGBA1C 6.3 11/25/2020   MICROALBUR 3.06 (H) 05/16/2011    Lab Results  Component Value Date   TSH 1.22 11/25/2020   Lab Results  Component Value Date   WBC 5.9 01/11/2021   HGB 13.7 01/11/2021   HCT 41.6 01/11/2021   MCV 88.2 01/11/2021   PLT 226.0 01/11/2021   Lab Results  Component Value Date   NA 141 01/11/2021   K 4.2 01/11/2021   CO2 29 01/11/2021   GLUCOSE 94 01/11/2021   BUN 15 01/11/2021   CREATININE 0.71 01/11/2021   BILITOT 0.6 01/11/2021   ALKPHOS 79 01/11/2021   AST 36 01/11/2021   ALT 52 (H) 01/11/2021   PROT 7.3 01/11/2021   ALBUMIN 4.4 01/11/2021   CALCIUM 9.8 01/11/2021   ANIONGAP 6 11/27/2017   GFR 87.83 01/11/2021   Lab Results  Component Value Date   CHOL 156 11/25/2020   Lab Results  Component Value Date   HDL 51.90 11/25/2020   Lab Results  Component Value Date   LDLCALC 85 11/25/2020   Lab Results  Component Value Date   TRIG 93.0 11/25/2020   Lab Results  Component Value Date   CHOLHDL 3 11/25/2020   Lab Results  Component Value Date   HGBA1C 6.3 11/25/2020       Assessment & Plan:   Problem List Items Addressed This Visit   None    No orders of the defined types were placed in this encounter.   I discussed the assessment and treatment plan with the patient. The patient was provided an opportunity to ask questions and all were answered. The patient agreed with the plan and demonstrated an understanding of the instructions.   The patient was advised to call back or seek an in-person evaluation if the symptoms worsen or if the condition fails to improve as anticipated.  I provided 35 minutes of face-to-face time during this encounter.   I,Zite Okoli,acting as a Education administrator for  Penni Homans, MD.,have documented all relevant documentation on the behalf of Penni Homans, MD,as directed by  Penni Homans, MD while in the presence of Penni Homans, MD.   I, Mosie Lukes, MD personally performed the services described in this documentation. All medical record entries made by the scribe were at my direction and in my presence. I have reviewed the chart and agree that the record reflects my personal performance and is accurate and complete     Penni Homans, MD [facility] [place] (phone) [phone] (fax)  [place]

## 2021-02-21 NOTE — Assessment & Plan Note (Signed)
Encourage heart healthy diet such as MIND or DASH diet, increase exercise, avoid trans fats, simple carbohydrates and processed foods, consider a krill or fish or flaxseed oil cap daily.  °

## 2021-02-21 NOTE — Assessment & Plan Note (Addendum)
She tested positve a week ago Thursday [place]. She did not get an antiviral as she has already been more than 5 days. Omron Blood Pressure cuff, upper arm, want BP 100-140/60-90 Pulse oximeter, want oxygen in 90s Tessalon Perles 200 mg have been helpful for cough and she is improving. Nasal congestion persists but fever has improved  Take Multivitamin with minerals, selenium Vitamin D 1000-2000 IU daily Probiotic with lactobacillus and bifidophilus

## 2021-02-21 NOTE — Assessment & Plan Note (Signed)
Minimize sodium, elevate feet stay active.

## 2021-02-24 ENCOUNTER — Other Ambulatory Visit: Payer: Self-pay

## 2021-02-24 ENCOUNTER — Ambulatory Visit (HOSPITAL_BASED_OUTPATIENT_CLINIC_OR_DEPARTMENT_OTHER)
Admission: RE | Admit: 2021-02-24 | Discharge: 2021-02-24 | Disposition: A | Discharge status: Home / Self Care | Payer: Managed Care, Other (non HMO) | Source: Ambulatory Visit | Attending: Family Medicine | Admitting: Family Medicine

## 2021-02-24 DIAGNOSIS — R1012 Left upper quadrant pain: Secondary | ICD-10-CM | POA: Insufficient documentation

## 2021-02-24 DIAGNOSIS — N390 Urinary tract infection, site not specified: Secondary | ICD-10-CM | POA: Diagnosis present

## 2021-02-25 ENCOUNTER — Telehealth: Payer: Self-pay | Admitting: *Deleted

## 2021-02-25 NOTE — Addendum Note (Signed)
Addended by: Kelle Darting A on: 02/25/2021 11:54 AM   Modules accepted: Orders

## 2021-02-25 NOTE — Telephone Encounter (Signed)
Pt has lab appointment on Tuesday. There are future orders with expected date of 9/26 for vit D, u/a and urine culture. There are orders for tsh, lipid, comp and cbc with no expected date.  Do these need to be done on Tuesdays as well?

## 2021-02-28 ENCOUNTER — Other Ambulatory Visit (INDEPENDENT_AMBULATORY_CARE_PROVIDER_SITE_OTHER): Payer: Managed Care, Other (non HMO)

## 2021-02-28 ENCOUNTER — Other Ambulatory Visit: Payer: Self-pay

## 2021-02-28 DIAGNOSIS — E559 Vitamin D deficiency, unspecified: Secondary | ICD-10-CM | POA: Diagnosis not present

## 2021-02-28 DIAGNOSIS — R1012 Left upper quadrant pain: Secondary | ICD-10-CM | POA: Diagnosis not present

## 2021-02-28 DIAGNOSIS — E782 Mixed hyperlipidemia: Secondary | ICD-10-CM

## 2021-02-28 DIAGNOSIS — I10 Essential (primary) hypertension: Secondary | ICD-10-CM

## 2021-02-28 DIAGNOSIS — R739 Hyperglycemia, unspecified: Secondary | ICD-10-CM

## 2021-02-28 LAB — COMPREHENSIVE METABOLIC PANEL
ALT: 63 U/L — ABNORMAL HIGH (ref 0–35)
AST: 44 U/L — ABNORMAL HIGH (ref 0–37)
Albumin: 3.9 g/dL (ref 3.5–5.2)
Alkaline Phosphatase: 75 U/L (ref 39–117)
BUN: 13 mg/dL (ref 6–23)
CO2: 26 mEq/L (ref 19–32)
Calcium: 9.4 mg/dL (ref 8.4–10.5)
Chloride: 103 mEq/L (ref 96–112)
Creatinine, Ser: 0.64 mg/dL (ref 0.40–1.20)
GFR: 91.2 mL/min (ref >60.00)
Glucose, Bld: 100 mg/dL — ABNORMAL HIGH (ref 70–99)
Potassium: 4.1 mEq/L (ref 3.5–5.1)
Sodium: 138 mEq/L (ref 135–145)
Total Bilirubin: 0.5 mg/dL (ref 0.2–1.2)
Total Protein: 6.5 g/dL (ref 6.0–8.3)

## 2021-02-28 LAB — URINALYSIS
Bilirubin Urine: NEGATIVE
Hgb urine dipstick: NEGATIVE
Ketones, ur: NEGATIVE
Leukocytes,Ua: NEGATIVE
Nitrite: NEGATIVE
Specific Gravity, Urine: <=1.005 — AB (ref 1.000–1.030)
Total Protein, Urine: NEGATIVE
Urine Glucose: NEGATIVE
Urobilinogen, UA: 0.2 (ref 0.0–1.0)
pH: 6 (ref 5.0–8.0)

## 2021-02-28 LAB — CBC
HCT: 39.5 % (ref 36.0–46.0)
Hemoglobin: 12.9 g/dL (ref 12.0–15.0)
MCHC: 32.6 g/dL (ref 30.0–36.0)
MCV: 88.2 fl (ref 78.0–100.0)
Platelets: 212 10*3/uL (ref 150.0–400.0)
RBC: 4.48 Mil/uL (ref 3.87–5.11)
RDW: 14.1 % (ref 11.5–15.5)
WBC: 5.6 10*3/uL (ref 4.0–10.5)

## 2021-02-28 LAB — LIPID PANEL
Cholesterol: 155 mg/dL (ref 0–200)
HDL: 46.3 mg/dL (ref >39.00)
LDL Cholesterol: 80 mg/dL (ref 0–99)
NonHDL: 108.68
Total CHOL/HDL Ratio: 3
Triglycerides: 141 mg/dL (ref 0.0–149.0)
VLDL: 28.2 mg/dL (ref 0.0–40.0)

## 2021-02-28 LAB — TSH: TSH: 1.74 u[IU]/mL (ref 0.35–5.50)

## 2021-02-28 LAB — HEMOGLOBIN A1C: Hgb A1c MFr Bld: 6.4 % (ref 4.6–6.5)

## 2021-02-28 LAB — VITAMIN D 25 HYDROXY (VIT D DEFICIENCY, FRACTURES): VITD: 49.86 ng/mL (ref 30.00–100.00)

## 2021-03-01 ENCOUNTER — Other Ambulatory Visit: Payer: Managed Care, Other (non HMO)

## 2021-03-01 LAB — URINE CULTURE
MICRO NUMBER:: 12451990
SPECIMEN QUALITY:: ADEQUATE

## 2021-03-03 ENCOUNTER — Ambulatory Visit: Payer: Managed Care, Other (non HMO) | Admitting: Family Medicine

## 2021-03-22 ENCOUNTER — Encounter: Payer: Self-pay | Admitting: Family Medicine

## 2021-03-22 ENCOUNTER — Other Ambulatory Visit: Payer: Self-pay

## 2021-03-22 ENCOUNTER — Ambulatory Visit (INDEPENDENT_AMBULATORY_CARE_PROVIDER_SITE_OTHER): Payer: Managed Care, Other (non HMO) | Admitting: Family Medicine

## 2021-03-22 VITALS — BP 132/70 | HR 76 | Temp 98.3°F | Resp 16 | Wt 192.0 lb

## 2021-03-22 DIAGNOSIS — Z23 Encounter for immunization: Secondary | ICD-10-CM

## 2021-03-22 DIAGNOSIS — K760 Fatty (change of) liver, not elsewhere classified: Secondary | ICD-10-CM | POA: Diagnosis not present

## 2021-03-22 DIAGNOSIS — I10 Essential (primary) hypertension: Secondary | ICD-10-CM

## 2021-03-22 DIAGNOSIS — I700 Atherosclerosis of aorta: Secondary | ICD-10-CM

## 2021-03-22 DIAGNOSIS — E66812 Obesity, class 2: Secondary | ICD-10-CM

## 2021-03-22 DIAGNOSIS — M549 Dorsalgia, unspecified: Secondary | ICD-10-CM

## 2021-03-22 DIAGNOSIS — R739 Hyperglycemia, unspecified: Secondary | ICD-10-CM

## 2021-03-22 MED ORDER — TIZANIDINE HCL 2 MG PO TABS: 1.0000 mg | ORAL_TABLET | Freq: Two times a day (BID) | ORAL | 1 refills | Status: DC | PRN

## 2021-03-22 NOTE — Patient Instructions (Signed)
Saxenda or Mancel Parsons are the weight loss medications. Kirke Shaggy is a pill you take daily and Mancel Parsons is an injection you take once weekly. Check with insurance and see which one is covered.

## 2021-03-22 NOTE — Progress Notes (Signed)
Patient ID: Margaret [name], adult    DOB: November 10, 1950  Age: 70 y.o. MRN: [id number]    Subjective:   Chief Complaint  Patient presents with   consult about scan   Subjective   HPI Margaret [name] presents for office visit today for follow up on recent CT scan and back pain. She reports still experiencing with left lower back pain. It is causing her trouble with sleep and feels tight when she wakes up. She also experiences spasms and has to stretch in order to feel better. She had a visit with Dr. Barbaraann Barthel in the past for her back pain. She also has c/o uneven back. Denies CP/palp/SOB/HA/congestion/fevers/GI or GU c/o. Taking meds as prescribed.   Review of Systems  Constitutional:  Negative for chills, fatigue and fever.  HENT:  Negative for congestion, rhinorrhea, sinus pressure, sinus pain and sore throat.   Eyes:  Negative for pain.  Respiratory:  Negative for cough and shortness of breath.   Cardiovascular:  Negative for chest pain, palpitations and leg swelling.  Gastrointestinal:  Negative for abdominal pain, blood in stool, diarrhea, nausea and vomiting.  Genitourinary:  Negative for decreased urine volume, flank pain, frequency, vaginal bleeding and vaginal discharge.  Musculoskeletal:  Positive for back pain (left lower).  Neurological:  Negative for headaches.   History Past Medical History:  Diagnosis Date   Allergic state 03/25/2012   Allergy    Aortic calcification ([place]) 08/27/2014   Atherosclerosis of aorta ([place]) 04/12/2014   Back pain    Constipation 01/23/2017   GERD (gastroesophageal reflux disease)    Hiatal hernia with gastroesophageal reflux 04/27/2010   Qualifier: Diagnosis of  By: Danelle Earthly CMA, Darlene  Failed Omeprazole and Protonix    Hx of adenomatous polyp of colon 01/23/2017   Colonoscopy 01/16/2017 with 2 polyps one was adenomatous. recommeded follow up in 5 years.performed by Dr Silverio Decamp   Hyperlipidemia    Hypertension    Joint pain    Osteopenia  08/31/2012   Peripheral edema 12/13/2014   Sleep apnea    Unspecified sinusitis (chronic) 03/30/2013    She has a past surgical history that includes Dilation and curettage of uterus; Esophageal manometry (N/A, 12/15/2015); and 24 hour ph study (N/A, 12/15/2015).   Her family history includes Diabetes in her sister; High Cholesterol in her mother; High blood pressure in her mother; Hyperlipidemia in her sister; Hypertension in her sister and sister; Lung cancer in her father; Obesity in her son; Sleep apnea in her son; Stroke in her mother and sister; Thyroid disease in her sister.She reports that she has never smoked. She has never used smokeless tobacco. She reports current alcohol use. She reports that she does not use drugs.  Current Outpatient Medications on File Prior to Visit  Medication Sig Dispense Refill   aspirin 81 MG chewable tablet Chew by mouth daily.     carvedilol (COREG) 6.25 MG tablet Take 1 tablet (6.25 mg total) by mouth 2 (two) times daily with a meal. 60 tablet 3   fish oil-omega-3 fatty acids 1000 MG capsule Take 2 g by mouth daily.     Multiple Vitamin (MULTIVITAMIN WITH MINERALS) TABS tablet Take 1 tablet by mouth daily.     omeprazole (PRILOSEC) 40 MG capsule Take 1 capsule (40 mg total) by mouth daily. 90 capsule 0   rosuvastatin (CRESTOR) 10 MG tablet Take 1 tablet (10 mg total) by mouth daily. 90 tablet 2   VITAMIN D PO Take 2,500 Units by mouth daily.  Zinc 50 MG TABS Take 1 tablet by mouth daily.     No current facility-administered medications on file prior to visit.     Objective:  Objective  Physical Exam Constitutional:      General: She is not in acute distress.    Appearance: Normal appearance. She is not ill-appearing or toxic-appearing.  HENT:     Head: Normocephalic and atraumatic.     Right Ear: Tympanic membrane, ear canal and external ear normal.     Left Ear: Tympanic membrane, ear canal and external ear normal.     Nose: No congestion or  rhinorrhea.  Eyes:     Extraocular Movements: Extraocular movements intact.     Pupils: Pupils are equal, round, and reactive to light.  Cardiovascular:     Rate and Rhythm: Normal rate and regular rhythm.     Pulses: Normal pulses.     Heart sounds: Normal heart sounds. No murmur heard. Pulmonary:     Effort: Pulmonary effort is normal. No respiratory distress.     Breath sounds: Normal breath sounds. No wheezing, rhonchi or rales.  Abdominal:     General: Bowel sounds are normal.     Palpations: Abdomen is soft. There is no mass.     Tenderness: There is no abdominal tenderness. There is no guarding.     Hernia: No hernia is present.  Musculoskeletal:        General: Tenderness present. Normal range of motion.     Cervical back: Normal range of motion and neck supple.     Comments: -sacroiliac joints tenderness with palpation  -Rotated back  Skin:    General: Skin is warm and dry.  Neurological:     Mental Status: She is alert and oriented to person, place, and time.  Psychiatric:        Behavior: Behavior normal.   BP 132/70   Pulse 76   Temp 98.3 F (36.8 C)   Resp 16   Wt 192 lb (87.1 kg)   SpO2 94%   BMI 37.50 kg/m  Wt Readings from Last 3 Encounters:  03/22/21 192 lb (87.1 kg)  01/26/21 192 lb 6.4 oz (87.3 kg)  01/20/21 189 lb 3.2 oz (85.8 kg)     Lab Results  Component Value Date   WBC 5.6 02/28/2021   HGB 12.9 02/28/2021   HCT 39.5 02/28/2021   PLT 212.0 02/28/2021   GLUCOSE 100 (H) 02/28/2021   CHOL 155 02/28/2021   TRIG 141.0 02/28/2021   HDL 46.30 02/28/2021   LDLCALC 80 02/28/2021   ALT 63 (H) 02/28/2021   AST 44 (H) 02/28/2021   NA 138 02/28/2021   K 4.1 02/28/2021   CL 103 02/28/2021   CREATININE 0.64 02/28/2021   BUN 13 02/28/2021   CO2 26 02/28/2021   TSH 1.74 02/28/2021   HGBA1C 6.4 02/28/2021   MICROALBUR 3.06 (H) 05/16/2011    CT RENAL STONE STUDY  Result Date: 02/26/2021 CLINICAL DATA:  Suspect kidney stones. Flank pain and  intermittent low back pain with hematuria 3 months. UTIs. EXAM: CT ABDOMEN AND PELVIS WITHOUT CONTRAST TECHNIQUE: Multidetector CT imaging of the abdomen and pelvis was performed following the standard protocol without IV contrast. COMPARISON:  01/12/2014 FINDINGS: Lower chest: Calcified granuloma over the posterior left lower lobe with adjacent scarring. Suggestion of left infrahilar calcified lymph node. Hepatobiliary: Moderate diffuse low-attenuation of the liver compatible with fatty infiltration. No focal liver mass. Gallbladder is contracted. Biliary tree is unremarkable. Pancreas: Normal.  Spleen: Normal. Adrenals/Urinary Tract: Adrenal glands are normal. Kidneys are normal in size without evidence of hydronephrosis or nephrolithiasis. No focal renal mass. No perinephric inflammation or fluid. Ureters and bladder are normal. Stomach/Bowel: Stomach and small bowel are normal. Appendix is normal. Minimal diverticulosis of the colon. Vascular/Lymphatic: Abdominal aorta is normal in caliber. There is mild calcified plaque throughout the abdominal aorta. No adenopathy. Reproductive: Normal. Other: No free fluid or focal inflammatory change. Musculoskeletal: Minimal degenerative change of the spine. IMPRESSION: 1. No acute findings in the abdomen/pelvis. No renal/ureteral stones or obstruction. 2. Moderate fatty infiltration of the liver. 3. Minimal colonic diverticulosis. 4. Aortic atherosclerosis. Aortic Atherosclerosis (ICD10-I70.0). Electronically Signed   By: Marin Olp M.D.   On: 02/26/2021 10:18     Assessment & Plan:  Plan    Meds ordered this encounter  Medications   tiZANidine (ZANAFLEX) 2 MG tablet    Sig: Take 0.5-2 tablets (1-4 mg total) by mouth 2 (two) times daily as needed for muscle spasms.    Dispense:  30 tablet    Refill:  1    Problem List Items Addressed This Visit     Back pain    Left sides and worse with position changes. Likely musculoskeletal. xrays ordered and then  patient is interested in pursuing chiropractic care first. Encouraged moist heat and gentle stretching as tolerated. May try NSAIDs and prescription meds as directed and report if symptoms worsen or seek immediate care. She will let us know if she is ready for further imaging or referral      Relevant Medications   tiZANidine (ZANAFLEX) 2 MG tablet   Other Relevant Orders   DG Lumbar Spine 2-3 Views   DG Thoracic Spine 2 View   Ambulatory referral to Orthopedic Surgery   Hyperglycemia    hgba1c acceptable, minimize simple carbs. Increase exercise as tolerated. Given flu shot today      Essential hypertension    Well controlled, no changes to meds. Encouraged heart healthy diet such as the DASH diet and exercise as tolerated.       Aortic atherosclerosis ([place])    She just had a CT to rule out kidney stones and this was confirmed. Patient is concerned she is advised the way to manage this is all the things she is already aware of. Attempt modest weight loss. It a low inflammation diet like the MIND diet, increase exercise. Use meds as needed to control cholesterol, HTN etc.       Fatty infiltration of liver    Present on Recent CT scan. Reiterated need for healthy diet, decrease carbs and processed foods, attempt weight loss      Obesity    Encouraged DASH or MIND diet, decrease po intake and increase exercise as tolerated. Needs 7-8 hours of sleep nightly. Avoid trans fats, eat small, frequent meals every 4-5 hours with lean proteins, complex carbs and healthy fats. Minimize simple carbs, high fat foods and processed foods. Consider Wegovy or Saxenda. She is going to check with her insurance regarding coverage and let us know if she wants to proceed      Other Visit Diagnoses     Need for influenza vaccination    -  Primary   Relevant Orders   Flu Vaccine QUAD High Dose(Fluad) (Completed)       Follow-up: Return in about 3 months (around 06/22/2021) for f/u visit.  I, Suezanne Jacquet, acting as a scribe for Penni Homans, MD, have documented all relevent  documentation on behalf of Penni Homans, MD, as directed by Penni Homans, MD while in the presence of Penni Homans, MD. DO:03/23/21.  I, Mosie Lukes, MD personally performed the services described in this documentation. All medical record entries made by the scribe were at my direction and in my presence. I have reviewed the chart and agree that the record reflects my personal performance and is accurate and complete

## 2021-03-23 NOTE — Assessment & Plan Note (Signed)
Present on Recent CT scan. Reiterated need for healthy diet, decrease carbs and processed foods, attempt weight loss

## 2021-03-23 NOTE — Assessment & Plan Note (Signed)
Left sides and worse with position changes. Likely musculoskeletal. xrays ordered and then patient is interested in pursuing chiropractic care first. Encouraged moist heat and gentle stretching as tolerated. May try NSAIDs and prescription meds as directed and report if symptoms worsen or seek immediate care. She will let us know if she is ready for further imaging or referral

## 2021-03-23 NOTE — Assessment & Plan Note (Signed)
Encouraged DASH or MIND diet, decrease po intake and increase exercise as tolerated. Needs 7-8 hours of sleep nightly. Avoid trans fats, eat small, frequent meals every 4-5 hours with lean proteins, complex carbs and healthy fats. Minimize simple carbs, high fat foods and processed foods. Consider Wegovy or Saxenda. She is going to check with her insurance regarding coverage and let us know if she wants to proceed

## 2021-03-23 NOTE — Assessment & Plan Note (Signed)
Well controlled, no changes to meds. Encouraged heart healthy diet such as the DASH diet and exercise as tolerated.  °

## 2021-03-23 NOTE — Assessment & Plan Note (Addendum)
hgba1c acceptable, minimize simple carbs. Increase exercise as tolerated. Given flu shot today 

## 2021-03-23 NOTE — Assessment & Plan Note (Signed)
She just had a CT to rule out kidney stones and this was confirmed. Patient is concerned she is advised the way to manage this is all the things she is already aware of. Attempt modest weight loss. It a low inflammation diet like the MIND diet, increase exercise. Use meds as needed to control cholesterol, HTN etc.

## 2021-03-29 ENCOUNTER — Other Ambulatory Visit: Payer: Self-pay

## 2021-03-29 ENCOUNTER — Ambulatory Visit (HOSPITAL_BASED_OUTPATIENT_CLINIC_OR_DEPARTMENT_OTHER)
Admission: RE | Admit: 2021-03-29 | Discharge: 2021-03-29 | Disposition: A | Discharge status: Home / Self Care | Payer: Managed Care, Other (non HMO) | Source: Ambulatory Visit | Attending: Family Medicine | Admitting: Family Medicine

## 2021-03-29 DIAGNOSIS — M549 Dorsalgia, unspecified: Secondary | ICD-10-CM | POA: Insufficient documentation

## 2021-04-07 ENCOUNTER — Other Ambulatory Visit: Payer: Self-pay

## 2021-04-07 ENCOUNTER — Ambulatory Visit (INDEPENDENT_AMBULATORY_CARE_PROVIDER_SITE_OTHER): Payer: Managed Care, Other (non HMO) | Admitting: Orthopaedic Surgery

## 2021-04-07 ENCOUNTER — Encounter: Payer: Self-pay | Admitting: Orthopaedic Surgery

## 2021-04-07 DIAGNOSIS — M545 Low back pain, unspecified: Secondary | ICD-10-CM

## 2021-04-07 DIAGNOSIS — M47816 Spondylosis without myelopathy or radiculopathy, lumbar region: Secondary | ICD-10-CM | POA: Insufficient documentation

## 2021-04-07 DIAGNOSIS — G8929 Other chronic pain: Secondary | ICD-10-CM | POA: Diagnosis not present

## 2021-04-07 NOTE — Progress Notes (Signed)
Office Visit Note   Patient: Margaret [name]           Date of Birth: May 02, 1951           MRN: [id number] Visit Date: 04/07/2021              Requested by: Mosie Lukes, MD [address] [place],  [place] [address] PCP: Mosie Lukes, MD   Assessment & Plan: Visit Diagnoses:  1. Chronic bilateral low back pain without sciatica     Plan: 70 year old female with a long history of low back pain with some referred discomfort into left greater than right buttock.  On occasion she has had some "funny feelings" in her thighs.  She seems to have more difficulty the longer she stands.  Not had any specific numbness or tingling.  No bowel or bladder changes.  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  I suspect the arthritis is the cause of her pain.  She does not appear to have any neurologic deficit nor evidence of radiculopathy.  Have discussed different treatment options.  She has had some therapy in the past and is taken over-the-counter medicines and some "pain pills" from her primary care physician.  They do not seem to make much of a difference.  I think at this point is worth obtaining an MRI scan to further define her chronic problem.  She might be a candidate for facet joint injections or further physical therapy.  Follow-Up Instructions: Return After MRI scan lumbar spine.   Orders:  Orders Placed This Encounter  Procedures   MR Lumbar Spine w/o contrast   No orders of the defined types were placed in this encounter.     Procedures: No procedures performed   Clinical Data: No additional findings.   Subjective: Chief Complaint  Patient presents with   Lower Back - Pain  Patient presents today for lower back pain. She said that it has been hurting for months. No known injury. She states that she has leg spasms bilaterally. She has increased pain with holding anything heavy. She is not taking anything for pain. She saw her PCP and  has already had x-rays taken.   HPI  Review of Systems   Objective: Vital Signs: There were no vitals taken for this visit.  Physical Exam Constitutional:      Appearance: She is well-developed.  Eyes:     Pupils: Pupils are equal, round, and reactive to light.  Pulmonary:     Effort: Pulmonary effort is normal.  Skin:    General: Skin is warm and dry.  Neurological:     Mental Status: She is alert and oriented to person, place, and time.  Psychiatric:        Behavior: Behavior normal.    Ortho Exam awake alert and oriented x3.  Comfortable sitting.  Straight leg raise is negative bilaterally.  Painless range of motion of both hips.  No loss of motion of either hip.  Neurologically intact.  No percussible tenderness of lumbar spine.  No pain over either facet joint.  Motor and sensory exam intact  Specialty Comments:  No specialty comments available.  Imaging: No results found.   PMFS History: Patient Active Problem List   Diagnosis Date Noted   Low back pain 04/07/2021   COVID-19 02/21/2021   UTI (urinary tract infection) 08/01/2020   Muscle cramps 06/24/2020   Cough 06/22/2019   Insomnia 05/29/2019   Educated about  COVID-19 virus infection 09/20/2018   Headache 04/30/2018   Other fatigue 04/03/2018   Dyspnea 04/03/2018   Obstructive sleep apnea syndrome 04/03/2018   Vitamin D deficiency 04/03/2018   Obesity 01/22/2018   Neck pain 12/16/2017   Fatty infiltration of liver 12/16/2017   Hx of colonic polyps 01/23/2017   Constipation 01/23/2017   Cervical radiculopathy    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   TIA (transient ischemic attack) 09/06/2016   Right hand weakness    Peripheral edema 12/13/2014   Aortic atherosclerosis ([place]) 04/12/2014   Skin lesion of back 03/31/2014   Joint stiffness 03/31/2014   Dysuria 02/08/2014   Essential hypertension 02/08/2014   Atypical chest pain 01/21/2014   Hyperglycemia 07/27/2013   Sinusitis 03/30/2013    Preventative health care 03/30/2013   Left shoulder pain 11/14/2012   Osteopenia 08/31/2012   Back pain 03/25/2012   Knee pain 03/25/2012   Allergic state 22/63/3354   Helicobacter pylori infection 06/08/2010   Hiatal hernia with gastroesophageal reflux 04/27/2010   Hyperlipidemia, mixed 04/07/2010   Past Medical History:  Diagnosis Date   Allergic state 03/25/2012   Allergy    Aortic calcification ([place]) 08/27/2014   Atherosclerosis of aorta ([place]) 04/12/2014   Back pain    Constipation 01/23/2017   GERD (gastroesophageal reflux disease)    Hiatal hernia with gastroesophageal reflux 04/27/2010   Qualifier: Diagnosis of  By: Danelle Earthly CMA, Darlene  Failed Omeprazole and Protonix    Hx of adenomatous polyp of colon 01/23/2017   Colonoscopy 01/16/2017 with 2 polyps one was adenomatous. recommeded follow up in 5 years.performed by Dr Silverio Decamp   Hyperlipidemia    Hypertension    Joint pain    Osteopenia 08/31/2012   Peripheral edema 12/13/2014   Sleep apnea    Unspecified sinusitis (chronic) 03/30/2013    Family History  Problem Relation Age of Onset   Stroke Mother    High blood pressure Mother    High Cholesterol Mother    Lung cancer Father    Stroke Sister        76   Thyroid disease Sister    Diabetes Sister    Hypertension Sister    Hyperlipidemia Sister    Hypertension Sister    Sleep apnea Son    Obesity Son     Past Surgical History:  Procedure Laterality Date   46 HOUR [place] STUDY N/A 12/15/2015   Procedure: 24 HOUR PH STUDY;  Surgeon: Mauri Pole, MD;  Location: [facility];  Service: Endoscopy;  Laterality: N/A;   DILATION AND CURETTAGE OF UTERUS     ESOPHAGEAL MANOMETRY N/A 12/15/2015   Procedure: ESOPHAGEAL MANOMETRY (EM);  Surgeon: Mauri Pole, MD;  Location: [facility];  Service: Endoscopy;  Laterality: N/A;   Social History   Occupational History   Occupation: Optometrist    Comment: retired  Tobacco Use   Smoking status: Never   Smokeless  tobacco: Never  Vaping Use   Vaping Use: Never used  Substance and Sexual Activity   Alcohol use: Yes    Alcohol/week: 0.0 standard drinks    Comment: Rarely   Drug use: No   Sexual activity: Not Currently    Comment: no dietary restrictions, lives with husand, works at [employer]

## 2021-04-25 ENCOUNTER — Emergency Department (HOSPITAL_BASED_OUTPATIENT_CLINIC_OR_DEPARTMENT_OTHER)
Admission: EM | Admit: 2021-04-25 | Discharge: 2021-04-25 | Disposition: A | Discharge status: Home / Self Care | Payer: Managed Care, Other (non HMO) | Attending: Emergency Medicine | Admitting: Emergency Medicine

## 2021-04-25 ENCOUNTER — Encounter (HOSPITAL_BASED_OUTPATIENT_CLINIC_OR_DEPARTMENT_OTHER): Payer: Self-pay | Admitting: Emergency Medicine

## 2021-04-25 ENCOUNTER — Other Ambulatory Visit: Payer: Self-pay

## 2021-04-25 ENCOUNTER — Emergency Department (HOSPITAL_BASED_OUTPATIENT_CLINIC_OR_DEPARTMENT_OTHER): Payer: Managed Care, Other (non HMO)

## 2021-04-25 DIAGNOSIS — G8929 Other chronic pain: Secondary | ICD-10-CM | POA: Diagnosis not present

## 2021-04-25 DIAGNOSIS — R110 Nausea: Secondary | ICD-10-CM | POA: Diagnosis not present

## 2021-04-25 DIAGNOSIS — M25552 Pain in left hip: Secondary | ICD-10-CM | POA: Insufficient documentation

## 2021-04-25 DIAGNOSIS — Z79899 Other long term (current) drug therapy: Secondary | ICD-10-CM | POA: Insufficient documentation

## 2021-04-25 DIAGNOSIS — R42 Dizziness and giddiness: Secondary | ICD-10-CM | POA: Diagnosis present

## 2021-04-25 DIAGNOSIS — I10 Essential (primary) hypertension: Secondary | ICD-10-CM | POA: Diagnosis not present

## 2021-04-25 DIAGNOSIS — Z7982 Long term (current) use of aspirin: Secondary | ICD-10-CM | POA: Diagnosis not present

## 2021-04-25 DIAGNOSIS — Z8616 Personal history of COVID-19: Secondary | ICD-10-CM | POA: Insufficient documentation

## 2021-04-25 LAB — CBC WITH DIFFERENTIAL/PLATELET
Abs Immature Granulocytes: 0.01 10*3/uL (ref 0.00–0.07)
Basophils Absolute: 0.1 10*3/uL (ref 0.0–0.1)
Basophils Relative: 1 %
Eosinophils Absolute: 0.3 10*3/uL (ref 0.0–0.5)
Eosinophils Relative: 6 %
HCT: 39 % (ref 36.0–46.0)
Hemoglobin: 13 g/dL (ref 12.0–15.0)
Immature Granulocytes: 0 %
Lymphocytes Relative: 40 %
Lymphs Abs: 2.1 10*3/uL (ref 0.7–4.0)
MCH: 29.3 pg (ref 26.0–34.0)
MCHC: 33.3 g/dL (ref 30.0–36.0)
MCV: 87.8 fL (ref 80.0–100.0)
Monocytes Absolute: 0.6 10*3/uL (ref 0.1–1.0)
Monocytes Relative: 11 %
Neutro Abs: 2.2 10*3/uL (ref 1.7–7.7)
Neutrophils Relative %: 42 %
Platelets: 209 10*3/uL (ref 150–400)
RBC: 4.44 MIL/uL (ref 3.87–5.11)
RDW: 14.4 % (ref 11.5–15.5)
WBC: 5.3 10*3/uL (ref 4.0–10.5)
nRBC: 0 % (ref 0.0–0.2)

## 2021-04-25 LAB — URINALYSIS, ROUTINE W REFLEX MICROSCOPIC
Bilirubin Urine: NEGATIVE
Glucose, UA: NEGATIVE mg/dL
Hgb urine dipstick: NEGATIVE
Ketones, ur: NEGATIVE mg/dL
Leukocytes,Ua: NEGATIVE
Nitrite: NEGATIVE
Protein, ur: NEGATIVE mg/dL
Specific Gravity, Urine: 1.015 (ref 1.005–1.030)
pH: 7 (ref 5.0–8.0)

## 2021-04-25 LAB — COMPREHENSIVE METABOLIC PANEL
ALT: 54 U/L — ABNORMAL HIGH (ref 0–44)
AST: 47 U/L — ABNORMAL HIGH (ref 15–41)
Albumin: 3.8 g/dL (ref 3.5–5.0)
Alkaline Phosphatase: 84 U/L (ref 38–126)
Anion gap: 8 (ref 5–15)
BUN: 18 mg/dL (ref 8–23)
CO2: 27 mmol/L (ref 22–32)
Calcium: 9 mg/dL (ref 8.9–10.3)
Chloride: 103 mmol/L (ref 98–111)
Creatinine, Ser: 0.65 mg/dL (ref 0.44–1.00)
GFR, Estimated: >60 mL/min (ref >60)
Glucose, Bld: 129 mg/dL — ABNORMAL HIGH (ref 70–99)
Potassium: 3.9 mmol/L (ref 3.5–5.1)
Sodium: 138 mmol/L (ref 135–145)
Total Bilirubin: 0.5 mg/dL (ref 0.3–1.2)
Total Protein: 7.3 g/dL (ref 6.5–8.1)

## 2021-04-25 LAB — TROPONIN I (HIGH SENSITIVITY): Troponin I (High Sensitivity): 3 ng/L (ref <18)

## 2021-04-25 LAB — LIPASE, BLOOD: Lipase: 57 U/L — ABNORMAL HIGH (ref 11–51)

## 2021-04-25 LAB — MAGNESIUM: Magnesium: 2.1 mg/dL (ref 1.7–2.4)

## 2021-04-25 MED ORDER — ACETAMINOPHEN 325 MG PO TABS
650.0000 mg | ORAL_TABLET | Freq: Once | ORAL | Status: AC
  Administered 2021-04-25: 08:00:00 650 mg via ORAL
  Filled 2021-04-25: qty 2

## 2021-04-25 MED ORDER — DIAZEPAM 5 MG/ML IJ SOLN
5.0000 mg | Freq: Once | INTRAMUSCULAR | Status: AC
  Administered 2021-04-25: 08:00:00 5 mg via INTRAVENOUS
  Filled 2021-04-25: qty 2

## 2021-04-25 MED ORDER — KETOROLAC TROMETHAMINE 15 MG/ML IJ SOLN
15.0000 mg | Freq: Once | INTRAMUSCULAR | Status: AC
  Administered 2021-04-25: 11:00:00 15 mg via INTRAVENOUS
  Filled 2021-04-25: qty 1

## 2021-04-25 MED ORDER — MECLIZINE HCL 25 MG PO TABS: 25.0000 mg | ORAL_TABLET | Freq: Three times a day (TID) | ORAL | 0 refills | Status: DC | PRN

## 2021-04-25 MED ORDER — MECLIZINE HCL 25 MG PO TABS
25.0000 mg | ORAL_TABLET | Freq: Once | ORAL | Status: AC
  Administered 2021-04-25: 11:00:00 25 mg via ORAL
  Filled 2021-04-25: qty 1

## 2021-04-25 MED ORDER — ASPIRIN 81 MG PO CHEW
324.0000 mg | CHEWABLE_TABLET | Freq: Once | ORAL | Status: AC
  Administered 2021-04-25: 08:00:00 324 mg via ORAL
  Filled 2021-04-25: qty 4

## 2021-04-25 MED ORDER — SODIUM CHLORIDE 0.9 % IV BOLUS
1000.0000 mL | Freq: Once | INTRAVENOUS | Status: AC
  Administered 2021-04-25: 08:00:00 1000 mL via INTRAVENOUS

## 2021-04-25 NOTE — ED Provider Notes (Signed)
[facility] Provider Note   CSN: [id number] Arrival date & time: 04/25/21  0704     History Chief Complaint  Patient presents with   Dizziness    Margaret [name] is a 70 y.o. adult.  HPI Patient presents for elevated blood pressure, dizziness, and left hip pain.  Medical history notable for chronic back pain, constipation, GERD, HLD, HTN, and OSA.  She was seen in Ortho clinic 2.5 weeks ago for left posterior hip pain radiating to her thighs.  On x-ray imaging, she is noted to have considerable facet arthritis which was determined to be likely etiology.  Plan is for MRI of lumbar spine.  She has not undergone the study yet.  Today she reports onset of dizziness at 5:30 AM.  This was the time that she woke up.  She noticed that when she got up out of bed.  She denies any history of dizziness.  Last night, she was in her normal state of health, although she did have worsened chronic left posterior hip pain.  She states that she has not been taking anything at home for this pain.  Currently, she continues to endorse left posterior hip pain that does radiate down the proximal left leg.  With her dizziness, she did have nausea.  She did not vomit.  Currently, at rest she does not experience dizziness but she does when she gets up or moves around.  She denies any focal areas of numbness or weakness.  She denies any changes to her vision.  She denies any chest discomfort but does endorse some epigastric bloating sensation.    Past Medical History:  Diagnosis Date   Allergic state 03/25/2012   Allergy    Aortic calcification ([place]) 08/27/2014   Atherosclerosis of aorta ([place]) 04/12/2014   Back pain    Constipation 01/23/2017   GERD (gastroesophageal reflux disease)    Hiatal hernia with gastroesophageal reflux 04/27/2010   Qualifier: Diagnosis of  By: Danelle Earthly CMA, Darlene  Failed Omeprazole and Protonix    Hx of adenomatous polyp of colon 01/23/2017   Colonoscopy  01/16/2017 with 2 polyps one was adenomatous. recommeded follow up in 5 years.performed by Dr Silverio Decamp   Hyperlipidemia    Hypertension    Joint pain    Osteopenia 08/31/2012   Peripheral edema 12/13/2014   Sleep apnea    Unspecified sinusitis (chronic) 03/30/2013    Patient Active Problem List   Diagnosis Date Noted   Low back pain 04/07/2021   COVID-19 02/21/2021   UTI (urinary tract infection) 08/01/2020   Muscle cramps 06/24/2020   Cough 06/22/2019   Insomnia 05/29/2019   Educated about COVID-19 virus infection 09/20/2018   Headache 04/30/2018   Other fatigue 04/03/2018   Dyspnea 04/03/2018   Obstructive sleep apnea syndrome 04/03/2018   Vitamin D deficiency 04/03/2018   Obesity 01/22/2018   Neck pain 12/16/2017   Fatty infiltration of liver 12/16/2017   Hx of colonic polyps 01/23/2017   Constipation 01/23/2017   Cervical radiculopathy    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   TIA (transient ischemic attack) 09/06/2016   Right hand weakness    Peripheral edema 12/13/2014   Aortic atherosclerosis ([place]) 04/12/2014   Skin lesion of back 03/31/2014   Joint stiffness 03/31/2014   Dysuria 02/08/2014   Essential hypertension 02/08/2014   Atypical chest pain 01/21/2014   Hyperglycemia 07/27/2013   Sinusitis 03/30/2013   Preventative health care 03/30/2013   Left shoulder pain 11/14/2012   Osteopenia 08/31/2012  Back pain 03/25/2012   Knee pain 03/25/2012   Allergic state 03/47/4259   Helicobacter pylori infection 06/08/2010   Hiatal hernia with gastroesophageal reflux 04/27/2010   Hyperlipidemia, mixed 04/07/2010    Past Surgical History:  Procedure Laterality Date   35 HOUR [place] STUDY N/A 12/15/2015   Procedure: 24 HOUR PH STUDY;  Surgeon: Mauri Pole, MD;  Location: [facility];  Service: Endoscopy;  Laterality: N/A;   DILATION AND CURETTAGE OF UTERUS     ESOPHAGEAL MANOMETRY N/A 12/15/2015   Procedure: ESOPHAGEAL MANOMETRY (EM);  Surgeon: Mauri Pole,  MD;  Location: [facility];  Service: Endoscopy;  Laterality: N/A;     OB History     Gravida  3   Para  2   Term  2   Preterm      AB  1   Living  2      SAB  1   IAB      Ectopic      Multiple      Live Births  2           Family History  Problem Relation Age of Onset   Stroke Mother    High blood pressure Mother    High Cholesterol Mother    Lung cancer Father    Stroke Sister        43   Thyroid disease Sister    Diabetes Sister    Hypertension Sister    Hyperlipidemia Sister    Hypertension Sister    Sleep apnea Son    Obesity Son     Social History   Tobacco Use   Smoking status: Never   Smokeless tobacco: Never  Vaping Use   Vaping Use: Never used  Substance Use Topics   Alcohol use: Yes    Alcohol/week: 0.0 standard drinks    Comment: Rarely   Drug use: No    Home Medications Prior to Admission medications   Medication Sig Start Date End Date Taking? Authorizing Provider  meclizine (ANTIVERT) 25 MG tablet Take 1 tablet (25 mg total) by mouth 3 (three) times daily as needed for dizziness. 04/25/21  Yes Godfrey Pick, MD  aspirin 81 MG chewable tablet Chew by mouth daily.    [provider]  carvedilol (COREG) 6.25 MG tablet Take 1 tablet (6.25 mg total) by mouth 2 (two) times daily with a meal. 01/11/21   Mosie Lukes, MD  fish oil-omega-3 fatty acids 1000 MG capsule Take 2 g by mouth daily.    [provider]  Multiple Vitamin (MULTIVITAMIN WITH MINERALS) TABS tablet Take 1 tablet by mouth daily.    [provider]  omeprazole (PRILOSEC) 40 MG capsule Take 1 capsule (40 mg total) by mouth daily. 03/25/20   Mauri Pole, MD  rosuvastatin (CRESTOR) 10 MG tablet Take 1 tablet (10 mg total) by mouth daily. 07/22/20   Mosie Lukes, MD  tiZANidine (ZANAFLEX) 2 MG tablet Take 0.5-2 tablets (1-4 mg total) by mouth 2 (two) times daily as needed for muscle spasms. 03/22/21   Mosie Lukes, MD  VITAMIN D  PO Take 2,500 Units by mouth daily.    [provider]  Zinc 50 MG TABS Take 1 tablet by mouth daily.    [provider]    Allergies    Lisinopril and Potassium-containing compounds  Review of Systems   Review of Systems  Constitutional:  Positive for fatigue. Negative for activity change, appetite change, chills  and fever.  HENT:  Negative for congestion, ear pain, rhinorrhea and sore throat.   Eyes:  Negative for pain and visual disturbance.  Respiratory:  Negative for cough, chest tightness, shortness of breath and wheezing.   Cardiovascular:  Negative for chest pain, palpitations and leg swelling.  Gastrointestinal:  Positive for nausea. Negative for abdominal pain, diarrhea and vomiting.       Epigastric bloating sensation  Genitourinary:  Negative for dysuria, frequency and hematuria.  Musculoskeletal:  Negative for arthralgias, back pain, myalgias and neck pain.  Skin:  Negative for color change and rash.  Neurological:  Positive for dizziness. Negative for tremors, seizures, syncope, facial asymmetry, speech difficulty, weakness and numbness.  Hematological:  Does not bruise/bleed easily.  Psychiatric/Behavioral:  Negative for confusion and decreased concentration.   All other systems reviewed and are negative.  Physical Exam Updated Vital Signs BP (!) 139/57   Pulse (!) 58   Temp 98 F (36.7 C)   Resp 18   Ht 5\' 1"  (1.549 m)   Wt 86.2 kg   SpO2 99%   BMI 35.90 kg/m   Physical Exam Vitals and nursing note reviewed.  Constitutional:      General: She is not in acute distress.    Appearance: Normal appearance. She is well-developed and normal weight. She is not ill-appearing, toxic-appearing or diaphoretic.  HENT:     Head: Normocephalic and atraumatic.     Right Ear: Tympanic membrane, ear canal and external ear normal.     Left Ear: Tympanic membrane, ear canal and external ear normal.     Nose: Nose normal. No congestion.     Mouth/Throat:      Mouth: Mucous membranes are moist.     Pharynx: Oropharynx is clear.  Eyes:     General: No visual field deficit or scleral icterus.    Extraocular Movements: Extraocular movements intact.     Conjunctiva/sclera: Conjunctivae normal.  Cardiovascular:     Rate and Rhythm: Normal rate and regular rhythm.     Heart sounds: No murmur heard. Pulmonary:     Effort: Pulmonary effort is normal. No respiratory distress.     Breath sounds: Normal breath sounds. No wheezing or rales.  Abdominal:     Palpations: Abdomen is soft.     Tenderness: There is no abdominal tenderness.  Musculoskeletal:        General: No swelling.     Cervical back: Normal range of motion and neck supple. No rigidity.     Right lower leg: No edema.     Left lower leg: No edema.  Skin:    General: Skin is warm and dry.     Capillary Refill: Capillary refill takes less than 2 seconds.     Coloration: Skin is not jaundiced or pale.  Neurological:     General: No focal deficit present.     Mental Status: She is alert and oriented to person, place, and time.     Cranial Nerves: No cranial nerve deficit, dysarthria or facial asymmetry.     Sensory: No sensory deficit.     Motor: No weakness, tremor, abnormal muscle tone or pronator drift.     Coordination: Coordination is intact. Coordination normal. Finger-Nose-Finger Test normal.  Psychiatric:        Mood and Affect: Mood normal.        Behavior: Behavior normal.        Thought Content: Thought content normal.        Judgment:  Judgment normal.    ED Results / Procedures / Treatments   Labs (all labs ordered are listed, but only abnormal results are displayed) Labs Reviewed  COMPREHENSIVE METABOLIC PANEL - Abnormal; Notable for the following components:      Result Value   Glucose, Bld 129 (*)    AST 47 (*)    ALT 54 (*)    All other components within normal limits  LIPASE, BLOOD - Abnormal; Notable for the following components:   Lipase 57 (*)    All  other components within normal limits  MAGNESIUM  CBC WITH DIFFERENTIAL/PLATELET  URINALYSIS, ROUTINE W REFLEX MICROSCOPIC  TROPONIN I (HIGH SENSITIVITY)  TROPONIN I (HIGH SENSITIVITY)    EKG EKG Interpretation  Date/Time:  Monday April 25 2021 07:36:39 EST Ventricular Rate:  59 PR Interval:  165 QRS Duration: 90 QT Interval:  415 QTC Calculation: 412 R Axis:   57 Text Interpretation: Sinus rhythm Confirmed by Godfrey Pick [phone]) on 04/25/2021 7:57:37 AM  Radiology CT Head Wo Contrast  Result Date: 04/25/2021 CLINICAL DATA:  Dizziness.  Generalized weakness. EXAM: CT HEAD WITHOUT CONTRAST TECHNIQUE: Contiguous axial images were obtained from the base of the skull through the vertex without intravenous contrast. COMPARISON:  04/08/2020 FINDINGS: Brain: No mass lesion, hemorrhage, hydrocephalus, acute infarct, intra-axial, or extra-axial fluid collection. Vascular: No hyperdense vessel or unexpected calcification. Skull: Normal Sinuses/Orbits: Normal imaged portions of the orbits and globes. Clear paranasal sinuses and mastoid air cells. Other: None. IMPRESSION: Normal head CT. Electronically Signed   By: Abigail Miyamoto M.D.   On: 04/25/2021 08:25   DG Chest Portable 1 View  Result Date: 04/25/2021 CLINICAL DATA:  Epigastric discomfort Dizziness Back pain Generalized weakness EXAM: PORTABLE CHEST 1 VIEW COMPARISON:  01/08/2020 FINDINGS: The heart size and mediastinal contours are within normal limits. Both lungs are clear. The visualized skeletal structures are unremarkable. IMPRESSION: No active disease. Electronically Signed   By: Miachel Roux M.D.   On: 04/25/2021 07:52    Procedures Procedures   Medications Ordered in ED Medications  aspirin chewable tablet 324 mg (324 mg Oral Given 04/25/21 0750)  sodium chloride 0.9 % bolus 1,000 mL (0 mLs Intravenous Stopped 04/25/21 1108)  diazepam (VALIUM) injection 5 mg (5 mg Intravenous Given 04/25/21 0824)  acetaminophen (TYLENOL)  tablet 650 mg (650 mg Oral Given 04/25/21 0749)  ketorolac (TORADOL) 15 MG/ML injection 15 mg (15 mg Intravenous Given 04/25/21 1119)  meclizine (ANTIVERT) tablet 25 mg (25 mg Oral Given 04/25/21 1126)    ED Course  I have reviewed the triage vital signs and the nursing notes.  Pertinent labs & imaging results that were available during my care of the patient were reviewed by me and considered in my medical decision making (see chart for details).    MDM Rules/Calculators/A&P                          Patient presents for acute on chronic left posterior hip pain, as well as onset of dizziness this morning at 5:30 AM.  Dizziness is positional.  No nystagmus appreciated on physical exam.  She has no focal neurologic deficits.  Patient to undergo work-up, including troponin.  IV fluids and IV Valium ordered for symptomatic relief.  On reassessment, patient does report improvement in dizziness.  She was able to ambulate to the bathroom.  Following negative CT scan of head, Toradol given for further analgesia of her acute on chronic left hip pain.  Antivert given for further symptomatic relief of her dizziness.  Work-up in the ED was reassuring.  On further reassessment, patient continues to endorse improved symptoms.  Given the improvement in symptoms and positional nature of her vertigo, I do not suspect a central cause.  Patient was given prescription for Antivert, to be taken as needed.  She was advised to follow-up with her primary care doctor as well as her orthopedic doctor for ongoing management of her chronic symptoms.  Return precautions were given for any worsening of her symptoms.  She was discharged in stable condition.  Final Clinical Impression(s) / ED Diagnoses Final diagnoses:  Vertigo    Rx / DC Orders ED Discharge Orders          Ordered    meclizine (ANTIVERT) 25 MG tablet  3 times daily PRN        04/25/21 1307             Godfrey Pick, MD 04/26/21 [id number]

## 2021-04-25 NOTE — ED Notes (Signed)
Pt feels less dizzy currently.  Able to tolerated po fluids

## 2021-04-25 NOTE — ED Triage Notes (Addendum)
Pt woke up at 0530, c/o dizziness, back pain, and generalized weakness.  Pt able to transfer independently from [facility] to bed.  No unilateral weakness.  Face is symmetrical.  Pt states the dizziness improves with eyes closed.  No room spinning.

## 2021-04-27 ENCOUNTER — Ambulatory Visit
Admission: RE | Admit: 2021-04-27 | Discharge: 2021-04-27 | Disposition: A | Discharge status: Home / Self Care | Payer: Managed Care, Other (non HMO) | Source: Ambulatory Visit | Attending: Orthopaedic Surgery | Admitting: Orthopaedic Surgery

## 2021-04-27 DIAGNOSIS — M545 Low back pain, unspecified: Secondary | ICD-10-CM

## 2021-04-27 DIAGNOSIS — G8929 Other chronic pain: Secondary | ICD-10-CM

## 2021-05-02 ENCOUNTER — Ambulatory Visit: Payer: Managed Care, Other (non HMO) | Admitting: Family

## 2021-05-03 ENCOUNTER — Ambulatory Visit (INDEPENDENT_AMBULATORY_CARE_PROVIDER_SITE_OTHER): Payer: Managed Care, Other (non HMO) | Admitting: Orthopaedic Surgery

## 2021-05-03 ENCOUNTER — Other Ambulatory Visit: Payer: Self-pay

## 2021-05-03 ENCOUNTER — Encounter: Payer: Self-pay | Admitting: Orthopaedic Surgery

## 2021-05-03 ENCOUNTER — Other Ambulatory Visit: Payer: Self-pay | Admitting: Family Medicine

## 2021-05-03 DIAGNOSIS — M545 Low back pain, unspecified: Secondary | ICD-10-CM

## 2021-05-03 DIAGNOSIS — G8929 Other chronic pain: Secondary | ICD-10-CM

## 2021-05-03 NOTE — Progress Notes (Signed)
Office Visit Note   Patient: Margaret [name]           Date of Birth: Jul 25, 1950           MRN: [id number] Visit Date: 05/03/2021              Requested by: Mosie Lukes, MD [address] [place],  [place] [address] PCP: Mosie Lukes, MD   Assessment & Plan: Visit Diagnoses: No diagnosis found.  Plan: Pleasant 70 year old woman with a history of lower back pain that radiates into her legs.  She denies any weakness or paresthesias.  She especially notices difficulty when she has been sitting a while and she gets up she is very stiff and sore.  After she ambulates it improved.  She did have an MRI of her lumbar spine based on the symptoms and no improvement she is here to review this today.  Her symptoms are about the same.  She also mentions that she has been more active over the holidays and has put on a little weight.  We talked to her about trying physical therapy or an epidural steroid injection certainly given her MRI results she may do well from physical therapy and she would prefer to try this first.  If this does not cause improvement she of course could be referred for epidural steroid injections MRI was reviewed today no evidence of stenosis.  She does have some L4-5 facet arthropathy no suspicious osseous lesions  Follow-Up Instructions: No follow-ups on file.   Orders:  No orders of the defined types were placed in this encounter.  No orders of the defined types were placed in this encounter.     Procedures: No procedures performed   Clinical Data: No additional findings.   Subjective: No chief complaint on file. Patient presents today for follow up on her lower back. She had an MRI and is here today for those results.  HPI  Review of Systems  All other systems reviewed and are negative.   Objective: Vital Signs: There were no vitals taken for this visit.  Physical Exam Constitutional:      Appearance: Normal appearance.  Skin:     General: Skin is warm and dry.  Neurological:     Mental Status: She is alert.    Ortho Exam Examination of her lower spine demonstrates no tenderness to palpation.  She has negative straight leg raises bilaterally.  She has 5 out of 5 strength with dorsiflexion plantarflexion resisted flexion of her hips.  Deep tendon reflexes are symmetric bilaterally Specialty Comments:  No specialty comments available.  Imaging: No results found.   PMFS History: Patient Active Problem List   Diagnosis Date Noted   Low back pain 04/07/2021   COVID-19 02/21/2021   UTI (urinary tract infection) 08/01/2020   Muscle cramps 06/24/2020   Cough 06/22/2019   Insomnia 05/29/2019   Educated about COVID-19 virus infection 09/20/2018   Headache 04/30/2018   Other fatigue 04/03/2018   Dyspnea 04/03/2018   Obstructive sleep apnea syndrome 04/03/2018   Vitamin D deficiency 04/03/2018   Obesity 01/22/2018   Neck pain 12/16/2017   Fatty infiltration of liver 12/16/2017   Hx of colonic polyps 01/23/2017   Constipation 01/23/2017   Cervical radiculopathy    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   TIA (transient ischemic attack) 09/06/2016   Right hand weakness    Peripheral edema 12/13/2014   Aortic atherosclerosis ([facility]) 04/12/2014   Skin lesion of back  03/31/2014   Joint stiffness 03/31/2014   Dysuria 02/08/2014   Essential hypertension 02/08/2014   Atypical chest pain 01/21/2014   Hyperglycemia 07/27/2013   Sinusitis 03/30/2013   Preventative health care 03/30/2013   Left shoulder pain 11/14/2012   Osteopenia 08/31/2012   Back pain 03/25/2012   Knee pain 03/25/2012   Allergic state 29/92/4268   Helicobacter pylori infection 06/08/2010   Hiatal hernia with gastroesophageal reflux 04/27/2010   Hyperlipidemia, mixed 04/07/2010   Past Medical History:  Diagnosis Date   Allergic state 03/25/2012   Allergy    Aortic calcification ([facility]) 08/27/2014   Atherosclerosis of aorta ([facility]) 04/12/2014    Back pain    Constipation 01/23/2017   GERD (gastroesophageal reflux disease)    Hiatal hernia with gastroesophageal reflux 04/27/2010   Qualifier: Diagnosis of  By: Danelle Earthly CMA, Darlene  Failed Omeprazole and Protonix    Hx of adenomatous polyp of colon 01/23/2017   Colonoscopy 01/16/2017 with 2 polyps one was adenomatous. recommeded follow up in 5 years.performed by Dr Silverio Decamp   Hyperlipidemia    Hypertension    Joint pain    Osteopenia 08/31/2012   Peripheral edema 12/13/2014   Sleep apnea    Unspecified sinusitis (chronic) 03/30/2013    Family History  Problem Relation Age of Onset   Stroke Mother    High blood pressure Mother    High Cholesterol Mother    Lung cancer Father    Stroke Sister        50   Thyroid disease Sister    Diabetes Sister    Hypertension Sister    Hyperlipidemia Sister    Hypertension Sister    Sleep apnea Son    Obesity Son     Past Surgical History:  Procedure Laterality Date   70 HOUR [place] STUDY N/A 12/15/2015   Procedure: 24 HOUR PH STUDY;  Surgeon: Mauri Pole, MD;  Location: [facility];  Service: Endoscopy;  Laterality: N/A;   DILATION AND CURETTAGE OF UTERUS     ESOPHAGEAL MANOMETRY N/A 12/15/2015   Procedure: ESOPHAGEAL MANOMETRY (EM);  Surgeon: Mauri Pole, MD;  Location: [facility];  Service: Endoscopy;  Laterality: N/A;   Social History   Occupational History   Occupation: Optometrist    Comment: retired  Tobacco Use   Smoking status: Never   Smokeless tobacco: Never  Vaping Use   Vaping Use: Never used  Substance and Sexual Activity   Alcohol use: Yes    Alcohol/week: 0.0 standard drinks    Comment: Rarely   Drug use: No   Sexual activity: Not Currently    Comment: no dietary restrictions, lives with husand, works at [employer]

## 2021-05-17 ENCOUNTER — Encounter: Payer: Self-pay | Admitting: Family Medicine

## 2021-05-17 ENCOUNTER — Ambulatory Visit (INDEPENDENT_AMBULATORY_CARE_PROVIDER_SITE_OTHER): Payer: Managed Care, Other (non HMO) | Admitting: Family Medicine

## 2021-05-17 DIAGNOSIS — K219 Gastro-esophageal reflux disease without esophagitis: Secondary | ICD-10-CM

## 2021-05-17 DIAGNOSIS — G8929 Other chronic pain: Secondary | ICD-10-CM

## 2021-05-17 DIAGNOSIS — R739 Hyperglycemia, unspecified: Secondary | ICD-10-CM

## 2021-05-17 DIAGNOSIS — M545 Low back pain, unspecified: Secondary | ICD-10-CM | POA: Diagnosis not present

## 2021-05-17 DIAGNOSIS — R42 Dizziness and giddiness: Secondary | ICD-10-CM | POA: Insufficient documentation

## 2021-05-17 DIAGNOSIS — E782 Mixed hyperlipidemia: Secondary | ICD-10-CM

## 2021-05-17 DIAGNOSIS — E66812 Obesity, class 2: Secondary | ICD-10-CM

## 2021-05-17 DIAGNOSIS — I10 Essential (primary) hypertension: Secondary | ICD-10-CM

## 2021-05-17 DIAGNOSIS — E559 Vitamin D deficiency, unspecified: Secondary | ICD-10-CM

## 2021-05-17 DIAGNOSIS — K449 Diaphragmatic hernia without obstruction or gangrene: Secondary | ICD-10-CM

## 2021-05-17 NOTE — Assessment & Plan Note (Signed)
hgba1c acceptable, minimize simple carbs. Increase exercise as tolerated.  

## 2021-05-17 NOTE — Patient Instructions (Signed)
Wegovy and saxenda are the injectable weight loss medications. Margaret [name] is administered once weekly and saxenda is once daily. Check with your insurance after this month if covered then contact us.

## 2021-05-17 NOTE — Assessment & Plan Note (Signed)
Occasionally symptomatic but if she eats well and uses OTC meds it is manageable. Can f/u with GI if worsens.

## 2021-05-17 NOTE — Progress Notes (Signed)
Patient ID: Margaret [name], adult    DOB: 04-21-1951  Age: 70 y.o. MRN: [id number]    Subjective:   Chief Complaint  Patient presents with   3 months follow up   Subjective   HPI Margaret [name] presents for office visit today for follow up on back pain and osteopenia. She had to go to the ER for vertigo episodes. Was px Meclizine 25 mg and she reports that it works and has not experienced any episodes since then. It has happened when she was preparing for thanksgiving.  C/o back pain that she went to ortho for treatment and opted for PT exercises and was not comfortable doing epidurals. Denies CP/palp/SOB/HA/congestion/fevers/GI or GU c/o. Taking meds as prescribed.  Sometimes she experiences acid reflux.  Review of Systems  Constitutional:  Negative for chills, fatigue and fever.  HENT:  Negative for congestion, rhinorrhea, sinus pressure, sinus pain and sore throat.   Eyes:  Negative for pain.  Respiratory:  Negative for cough and shortness of breath.   Cardiovascular:  Negative for chest pain, palpitations and leg swelling.  Gastrointestinal:  Negative for abdominal pain, blood in stool, diarrhea, nausea and vomiting.  Genitourinary:  Negative for decreased urine volume, flank pain, frequency, vaginal bleeding and vaginal discharge.  Musculoskeletal:  Positive for back pain.  Neurological:  Negative for headaches.   History Past Medical History:  Diagnosis Date   Allergic state 03/25/2012   Allergy    Aortic calcification ([facility]) 08/27/2014   Atherosclerosis of aorta ([facility]) 04/12/2014   Back pain    Constipation 01/23/2017   GERD (gastroesophageal reflux disease)    Hiatal hernia with gastroesophageal reflux 04/27/2010   Qualifier: Diagnosis of  By: Danelle Earthly CMA, Darlene  Failed Omeprazole and Protonix    Hx of adenomatous polyp of colon 01/23/2017   Colonoscopy 01/16/2017 with 2 polyps one was adenomatous. recommeded follow up in 5 years.performed by Dr Silverio Decamp    Hyperlipidemia    Hypertension    Joint pain    Osteopenia 08/31/2012   Peripheral edema 12/13/2014   Sleep apnea    Unspecified sinusitis (chronic) 03/30/2013    She has a past surgical history that includes Dilation and curettage of uterus; Esophageal manometry (N/A, 12/15/2015); and 24 hour ph study (N/A, 12/15/2015).   Her family history includes Diabetes in her sister; High Cholesterol in her mother; High blood pressure in her mother; Hyperlipidemia in her sister; Hypertension in her sister and sister; Lung cancer in her father; Obesity in her son; Sleep apnea in her son; Stroke in her mother and sister; Thyroid disease in her sister.She reports that she has never smoked. She has never used smokeless tobacco. She reports current alcohol use. She reports that she does not use drugs.  Current Outpatient Medications on File Prior to Visit  Medication Sig Dispense Refill   aspirin 81 MG chewable tablet Chew by mouth daily.     carvedilol (COREG) 6.25 MG tablet TAKE 1 TABLET BY MOUTH 2 TIMES DAILY WITH A MEAL. 180 tablet 1   fish oil-omega-3 fatty acids 1000 MG capsule Take 2 g by mouth daily.     meclizine (ANTIVERT) 25 MG tablet Take 1 tablet (25 mg total) by mouth 3 (three) times daily as needed for dizziness. 16 tablet 0   Multiple Vitamin (MULTIVITAMIN WITH MINERALS) TABS tablet Take 1 tablet by mouth daily.     omeprazole (PRILOSEC) 40 MG capsule Take 1 capsule (40 mg total) by mouth daily. 90 capsule 0   rosuvastatin (  CRESTOR) 10 MG tablet Take 1 tablet (10 mg total) by mouth daily. 90 tablet 2   tiZANidine (ZANAFLEX) 2 MG tablet Take 0.5-2 tablets (1-4 mg total) by mouth 2 (two) times daily as needed for muscle spasms. 30 tablet 1   VITAMIN D PO Take 2,500 Units by mouth daily.     Zinc 50 MG TABS Take 1 tablet by mouth daily.     No current facility-administered medications on file prior to visit.     Objective:  Objective  Physical Exam Constitutional:      General: She is not in  acute distress.    Appearance: Normal appearance. She is not ill-appearing or toxic-appearing.  HENT:     Head: Normocephalic and atraumatic.     Right Ear: Tympanic membrane, ear canal and external ear normal.     Left Ear: Tympanic membrane, ear canal and external ear normal.     Nose: No congestion or rhinorrhea.  Eyes:     Extraocular Movements: Extraocular movements intact.     Pupils: Pupils are equal, round, and reactive to light.  Cardiovascular:     Rate and Rhythm: Normal rate and regular rhythm.     Pulses: Normal pulses.     Heart sounds: Normal heart sounds. No murmur heard. Pulmonary:     Effort: Pulmonary effort is normal. No respiratory distress.     Breath sounds: Normal breath sounds. No wheezing, rhonchi or rales.  Abdominal:     General: Bowel sounds are normal.     Palpations: Abdomen is soft. There is no mass.     Tenderness: There is no abdominal tenderness. There is no guarding.     Hernia: No hernia is present.  Musculoskeletal:        General: Normal range of motion.     Cervical back: Normal range of motion and neck supple.  Skin:    General: Skin is warm and dry.  Neurological:     Mental Status: She is alert and oriented to person, place, and time.  Psychiatric:        Behavior: Behavior normal.   BP 124/60    Pulse 77    Temp 97.8 F (36.6 C)    Resp 16    Ht 5\' 1"  (1.549 m)    Wt 192 lb 6.4 oz (87.3 kg)    SpO2 95%    BMI 36.35 kg/m  Wt Readings from Last 3 Encounters:  05/17/21 192 lb 6.4 oz (87.3 kg)  04/25/21 190 lb (86.2 kg)  03/22/21 192 lb (87.1 kg)     Lab Results  Component Value Date   WBC 5.3 04/25/2021   HGB 13.0 04/25/2021   HCT 39.0 04/25/2021   PLT 209 04/25/2021   GLUCOSE 129 (H) 04/25/2021   CHOL 155 02/28/2021   TRIG 141.0 02/28/2021   HDL 46.30 02/28/2021   LDLCALC 80 02/28/2021   ALT 54 (H) 04/25/2021   AST 47 (H) 04/25/2021   NA 138 04/25/2021   K 3.9 04/25/2021   CL 103 04/25/2021   CREATININE 0.65  04/25/2021   BUN 18 04/25/2021   CO2 27 04/25/2021   TSH 1.74 02/28/2021   HGBA1C 6.4 02/28/2021   MICROALBUR 3.06 (H) 05/16/2011    MR Lumbar Spine w/o contrast  Result Date: 04/28/2021 CLINICAL DATA:  Low back and left groin pain for a couple of months. EXAM: MRI LUMBAR SPINE WITHOUT CONTRAST TECHNIQUE: Multiplanar, multisequence MR imaging of the lumbar spine was performed. No intravenous  contrast was administered. COMPARISON:  None. FINDINGS: Segmentation:  Standard. Alignment:  Preserved. Vertebrae: Vertebral body heights are maintained. Minor edema associated with left L4-L5 facet arthropathy. No suspicious osseous lesion. Conus medullaris and cauda equina: Conus extends to the L1-L2 level. Conus and cauda equina appear normal. Paraspinal and other soft tissues: Unremarkable. Disc levels: T12-L1: Shallow central protrusion.  No stenosis. L1-L2: Shallow central protrusion.  No stenosis. L2-L3:  No stenosis. L3-L4:  No stenosis. L4-L5:  Left greater than right facet arthropathy.  No stenosis. L5-S1:  No stenosis. IMPRESSION: Left facet arthropathy at L4-L5 with some marrow edema. No significant stenosis at any level. Electronically Signed   By: Macy Mis M.D.   On: 04/28/2021 16:10     Assessment & Plan:  Plan    No orders of the defined types were placed in this encounter.   Problem List Items Addressed This Visit     Hyperlipidemia, mixed    Tolerating statin, encouraged heart healthy diet, avoid trans fats, minimize simple carbs and saturated fats. Increase exercise as tolerated      Hiatal hernia with gastroesophageal reflux    Occasionally symptomatic but if she eats well and uses OTC meds it is manageable. Can f/u with GI if worsens.      Hyperglycemia    hgba1c acceptable, minimize simple carbs. Increase exercise as tolerated.        Essential hypertension    Well controlled, no changes to meds. Encouraged heart healthy diet such as the DASH diet and exercise as  tolerated.       Obesity    Encouraged DASH or MIND diet, decrease po intake and increase exercise as tolerated. Needs 7-8 hours of sleep nightly. Avoid trans fats, eat small, frequent meals every 4-5 hours with lean proteins, complex carbs and healthy fats. Minimize simple carbs, high fat foods and processed foods      Vitamin D deficiency    Supplement and monitor      Low back pain    Is following with Dr Durward Fortes of ortho. Imaging shows arthritis starts PT on 05/19/21      Vertigo    Had a visit to ER on 11/28 after getting dehydrated. She feels better now, she has had Meclizine with a good response. she will let us know if worsens.        Follow-up: Return in about 4 months (around 09/15/2021) for f/u visit.  I, Suezanne Jacquet, acting as a scribe for Penni Homans, MD, have documented all relevent documentation on behalf of Penni Homans, MD, as directed by Penni Homans, MD while in the presence of Penni Homans, MD. DO:05/18/21.  I, Mosie Lukes, MD personally performed the services described in this documentation. All medical record entries made by the scribe were at my direction and in my presence. I have reviewed the chart and agree that the record reflects my personal performance and is accurate and complete

## 2021-05-17 NOTE — Assessment & Plan Note (Signed)
Well controlled, no changes to meds. Encouraged heart healthy diet such as the DASH diet and exercise as tolerated.  °

## 2021-05-17 NOTE — Assessment & Plan Note (Signed)
Had a visit to ER on 11/28 after getting dehydrated. She feels better now, she has had Meclizine with a good response. she will let us know if worsens.

## 2021-05-17 NOTE — Assessment & Plan Note (Signed)
Supplement and monitor 

## 2021-05-17 NOTE — Assessment & Plan Note (Addendum)
Is following with Dr Durward Fortes of ortho. Imaging shows arthritis starts PT on 05/19/21

## 2021-05-17 NOTE — Assessment & Plan Note (Signed)
Encouraged DASH or MIND diet, decrease po intake and increase exercise as tolerated. Needs 7-8 hours of sleep nightly. Avoid trans fats, eat small, frequent meals every 4-5 hours with lean proteins, complex carbs and healthy fats. Minimize simple carbs, high fat foods and processed foods 

## 2021-05-18 NOTE — Assessment & Plan Note (Signed)
Tolerating statin, encouraged heart healthy diet, avoid trans fats, minimize simple carbs and saturated fats. Increase exercise as tolerated 

## 2021-05-19 ENCOUNTER — Encounter: Payer: Self-pay | Admitting: Physical Therapy

## 2021-05-19 ENCOUNTER — Other Ambulatory Visit: Payer: Self-pay

## 2021-05-19 ENCOUNTER — Ambulatory Visit: Payer: Managed Care, Other (non HMO) | Attending: Orthopaedic Surgery | Admitting: Physical Therapy

## 2021-05-19 DIAGNOSIS — G8929 Other chronic pain: Secondary | ICD-10-CM | POA: Diagnosis present

## 2021-05-19 DIAGNOSIS — R262 Difficulty in walking, not elsewhere classified: Secondary | ICD-10-CM | POA: Diagnosis present

## 2021-05-19 DIAGNOSIS — M6281 Muscle weakness (generalized): Secondary | ICD-10-CM | POA: Insufficient documentation

## 2021-05-19 DIAGNOSIS — M5442 Lumbago with sciatica, left side: Secondary | ICD-10-CM | POA: Insufficient documentation

## 2021-05-19 DIAGNOSIS — M545 Low back pain, unspecified: Secondary | ICD-10-CM | POA: Insufficient documentation

## 2021-05-19 DIAGNOSIS — R293 Abnormal posture: Secondary | ICD-10-CM | POA: Insufficient documentation

## 2021-05-19 NOTE — Therapy (Signed)
[facility] [address]  [place], [place], [address] Phone: [phone]   Fax:  [phone]  Physical Therapy Evaluation  Patient Details  Name: Margaret [name] MRN: [id number] Date of Birth: [birth year] Referring Provider (PT): Joni Fears MD   Encounter Date: 05/19/2021   PT End of Session - 05/19/21 1555     Visit Number 1    Number of Visits 12    Date for PT Re-Evaluation 07/14/21    Authorization Type [employer] [id number]    PT Start Time 4403    PT Stop Time 4742    PT Time Calculation (min) 42 min    Activity Tolerance Patient tolerated treatment well    Behavior During Therapy [facility] for tasks assessed/performed             Past Medical History:  Diagnosis Date   Allergic state 03/25/2012   Allergy    Aortic calcification ([place]) 08/27/2014   Atherosclerosis of aorta ([place]) 04/12/2014   Back pain    Constipation 01/23/2017   GERD (gastroesophageal reflux disease)    Hiatal hernia with gastroesophageal reflux 04/27/2010   Qualifier: Diagnosis of  By: Danelle Earthly CMA, Darlene  Failed Omeprazole and Protonix    Hx of adenomatous polyp of colon 01/23/2017   Colonoscopy 01/16/2017 with 2 polyps one was adenomatous. recommeded follow up in 5 years.performed by Dr Silverio Decamp   Hyperlipidemia    Hypertension    Joint pain    Osteopenia 08/31/2012   Peripheral edema 12/13/2014   Sleep apnea    Unspecified sinusitis (chronic) 03/30/2013    Past Surgical History:  Procedure Laterality Date   24 HOUR [place] STUDY N/A 12/15/2015   Procedure: 24 HOUR PH STUDY;  Surgeon: Mauri Pole, MD;  Location: [facility];  Service: Endoscopy;  Laterality: N/A;   DILATION AND CURETTAGE OF UTERUS     ESOPHAGEAL MANOMETRY N/A 12/15/2015   Procedure: ESOPHAGEAL MANOMETRY (EM);  Surgeon: Mauri Pole, MD;  Location: [facility];  Service: Endoscopy;  Laterality: N/A;    There were no vitals filed for this visit.     Subjective Assessment - 05/19/21 1406     Subjective Pt. reports that she has some arthritis in her back causing a lot of stiffness in her low back after prolonged sitting,  She also can't walk very fast and gets pain down into her L groin.  Takes tylenol but it doesn't really lessen the pain.   Dr. offered her injections or therapy, want's to try therapy first.  Noted that her symptoms started about a year ago.  Really noticed how bad she was getting after travel in September.    Pertinent History HTN, history CVA/TIA, OSA, HTN    How long can you sit comfortably? no painful but becomes very tight, hard to stand up    How long can you stand comfortably? 1-2 hours    How long can you walk comfortably? has to walk slow, pain into L groin if walks fast    Diagnostic tests 04/27/21 - MRI - IMPRESSION:  Left facet arthropathy at L4-L5 with some marrow edema. No  significant stenosis at any level.    Patient Stated Goals move around with less pain, be more flexible    Currently in Pain? Yes    Pain Score 7     Pain Location Back    Pain Orientation Lower    Pain Descriptors / Indicators Tightness;Aching;Pressure;Spasm    Pain Type  Chronic pain    Pain Radiating Towards toward L groin    Pain Onset More than a month ago   ~ 1 year   Pain Frequency Intermittent    Aggravating Factors  transfers, fast walking, lifting    Pain Relieving Factors moving, walking slowly    Effect of Pain on Daily Activities can't walk fast, has to sit down and rest more frequently                [facility] PT Assessment - 05/19/21 0001       Assessment   Medical Diagnosis M54.50,G89.29 (ICD-10-CM) - Chronic bilateral low back pain without sciatica    Referring Provider (PT) Joni Fears MD    Onset Date/Surgical Date --   1 year   Hand Dominance Right    Next MD Visit none scheduled    Prior Therapy PT in 2018 for LBP      Precautions   Precautions None      Restrictions   Weight Bearing Restrictions  No      Balance Screen   Has the patient fallen in the past 6 months No    Has the patient had a decrease in activity level because of a fear of falling?  No    Is the patient reluctant to leave their home because of a fear of falling?  No      Home Environment   Living Environment Private residence    Living Arrangements Spouse/significant other    Type of [place] to enter    Entrance Stairs-Number of Steps 1    [place] Two level    Alternate Level Stairs-Number of Steps 12      Prior Function   Level of Independence Independent    Vocation Retired    Leisure church; travel      Cognition   Overall Cognitive Status Within Functional Limits for tasks assessed      Observation/Other Assessments   Observations Enters independently, frequently shifting posture when seated, uncomfortable.    Focus on Therapeutic Outcomes (FOTO)  lumbar: 44%.  Predicted outcome 54% after 12 visits.      Sensation   Light Touch Appears Intact      ROM / Strength   AROM / PROM / Strength AROM;PROM;Strength      AROM   Overall AROM  Deficits;Due to pain    AROM Assessment Site Lumbar    Lumbar Flexion limited 50%, pain    Lumbar Extension limited 50% pain    Lumbar - Right Side Bend to midthigh, pain    Lumbar - Left Side Bend to knee pain    Lumbar - Right Rotation increased pain    Lumbar - Left Rotation increased pain      PROM   Overall PROM  Within functional limits for tasks performed    Overall PROM Comments good hip mobility bil, symmetric, tightness with FABER bil      Strength   Overall Strength Deficits    Overall Strength Comments tested in sitting    Strength Assessment Site Hip;Knee;Ankle    Right/Left Hip Right;Left    Right Hip Flexion 4+/5    Right Hip ABduction 5/5    Right Hip ADduction 5/5    Left Hip Flexion 3+/5    Left Hip ABduction 5/5    Left Hip ADduction 5/5    Right/Left Knee Right;Left    Right Knee Flexion 5/5  Right  Knee Extension 5/5    Left Knee Flexion 4+/5    Left Knee Extension 5/5    Right/Left Ankle Right;Left    Right Ankle Dorsiflexion 5/5    Right Ankle Plantar Flexion 5/5    Left Ankle Dorsiflexion 5/5    Left Ankle Plantar Flexion 5/5      Flexibility   Soft Tissue Assessment /Muscle Length yes    Hamstrings B tightness    Quadriceps B tightness      Palpation   Spinal mobility tenderness and hypomobility lumbar spine    SI assessment  tenderness L SIJ    Palpation comment tenderness throughout bil lumbar paraspinals and glutes, L > R      Special Tests   Other special tests SLR neg bil (90 deg), FABER positive on L for pain in SIJ      Transfers   Five time sit to stand comments  26 sec, slow guarded, no UE assist                        Objective measurements completed on examination: See above findings.       [facility] Adult PT Treatment/Exercise - 05/19/21 0001       Posture/Postural Control   Posture/Postural Control Postural limitations    Postural Limitations Rounded Shoulders;Forward head   trunk shifted to Right, R shoulder higher than left.     Self-Care   Self-Care Other Self-Care Comments    Other Self-Care Comments  see patient instructions                     PT Education - 05/19/21 1801     Education Details education on findings, POC, given LTR and UTR to side of preference for initial HEP    Person(s) Educated Patient    Methods Explanation;Demonstration    Comprehension Verbalized understanding;Returned demonstration              PT Short Term Goals - 05/19/21 1806       PT SHORT TERM GOAL #1   Title Patient to be independent with initial HEP    Time 3    Period Weeks    Status New    Target Date 06/09/21      PT SHORT TERM GOAL #2   Title Patient to demonstrate good posture and body mechanics to reduced stressors placed on low back musculature for reduced pain    Baseline noted trunk shift to R    Time 3     Period Weeks    Status New    Target Date 06/16/21               PT Long Term Goals - 05/19/21 1807       PT LONG TERM GOAL #1   Title Patient to be independent with advanced HEP to improve outcomes    Time 8    Period Weeks    Status New    Target Date 07/14/21      PT LONG TERM GOAL #2   Title Patient wil report 75% improvement in LBP with activities.    Time 8    Period Weeks    Status New    Target Date 07/14/21      PT LONG TERM GOAL #3   Title Patient to report ability to perform transfers from various positions with no increase in pain    Baseline very stiff with transitional movements.  Time 8    Period Weeks    Status New    Target Date 07/14/21      PT LONG TERM GOAL #4   Title Patient will demonstrate improved functional LE strength by completing 5xSTS in < 15 seconds    Baseline 26 seconds, slow gaurded movements    Time 8    Period Weeks    Status New    Target Date 07/14/21      PT LONG TERM GOAL #5   Title Pt. will demonstrate improved functional ability by improving FOTO score to 54% or better.    Baseline 44%    Time 8    Period Weeks    Status New    Target Date 07/14/21                    Plan - 05/19/21 1802     Clinical Impression Statement Margaret [name] is a 70 year old female referred for chronic LBP.  She demonstrates tightness all directions, L hip flexor weakness, and decreased tolerance to activities.  On FOTO she scored only 44%.  She would benefit from skilled physical therapy to decrease LBP, improve functional strength and endurance, improve flexibility and improve tolerance to activities.    Personal Factors and Comorbidities Comorbidity 3+    Comorbidities history chronic LBP, HTN, history of CVA/TIA, obesity    Examination-Activity Limitations Locomotion Level;Stairs;Lift;Squat;Carry;Reach Overhead;Stand;Transfers;Sit;Sleep    Examination-Participation Restrictions Church;Community Activity;Meal Prep;Cleaning     Stability/Clinical Decision Making Stable/Uncomplicated    Clinical Decision Making Low    Rehab Potential Good    PT Frequency 2x / week    PT Duration 8 weeks   2 weeks added to POC due to holiday travel   PT Treatment/Interventions ADLs/Self Care Home Management;Cryotherapy;Electrical Stimulation;Moist Heat;Traction;Ultrasound;Gait training;Stair training;Functional mobility training;Therapeutic activities;Therapeutic exercise;Balance training;Neuromuscular re-education;Patient/family education;Manual techniques;Passive range of motion;Dry needling;Taping;Spinal Manipulations;Joint Manipulations    PT Next Visit Plan initiate neutral spine core strengthening exercises.  Manual/modalities PRN             Patient will benefit from skilled therapeutic intervention in order to improve the following deficits and impairments:  Decreased endurance, Decreased mobility, Difficulty walking, Increased muscle spasms, Hypomobility, Decreased range of motion, Decreased activity tolerance, Decreased strength, Increased fascial restricitons, Impaired flexibility, Postural dysfunction, Pain  Visit Diagnosis: Chronic midline low back pain with left-sided sciatica  Muscle weakness (generalized)  Abnormal posture  Difficulty in walking, not elsewhere classified     Problem List Patient Active Problem List   Diagnosis Date Noted   Vertigo 05/17/2021   Low back pain 04/07/2021   COVID-19 02/21/2021   UTI (urinary tract infection) 08/01/2020   Muscle cramps 06/24/2020   Cough 06/22/2019   Insomnia 05/29/2019   Educated about COVID-19 virus infection 09/20/2018   Headache 04/30/2018   Other fatigue 04/03/2018   Dyspnea 04/03/2018   Obstructive sleep apnea syndrome 04/03/2018   Vitamin D deficiency 04/03/2018   Obesity 01/22/2018   Neck pain 12/16/2017   Fatty infiltration of liver 12/16/2017   Hx of colonic polyps 01/23/2017   Constipation 01/23/2017   Cervical radiculopathy    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/06/2016   TIA (transient ischemic attack) 09/06/2016   Right hand weakness    Peripheral edema 12/13/2014   Aortic atherosclerosis ([place]) 04/12/2014   Skin lesion of back 03/31/2014   Joint stiffness 03/31/2014   Dysuria 02/08/2014   Essential hypertension 02/08/2014   Atypical chest pain 01/21/2014   Hyperglycemia 07/27/2013  Sinusitis 03/30/2013   Preventative health care 03/30/2013   Left shoulder pain 11/14/2012   Osteopenia 08/31/2012   Back pain 03/25/2012   Knee pain 03/25/2012   Allergic state 28/67/5198   Helicobacter pylori infection 06/08/2010   Hiatal hernia with gastroesophageal reflux 04/27/2010   Hyperlipidemia, mixed 04/07/2010    Rennie Natter, PT, DPT  05/19/2021, 6:18 PM  [facility] [address]  [place], [place], [address] Phone: [phone]   Fax:  [phone]  Name: Margaret [name] MRN: [id number] Date of Birth: [birth year]

## 2021-06-01 ENCOUNTER — Ambulatory Visit: Payer: Managed Care, Other (non HMO) | Attending: Orthopaedic Surgery

## 2021-06-01 ENCOUNTER — Other Ambulatory Visit: Payer: Self-pay

## 2021-06-01 DIAGNOSIS — M6281 Muscle weakness (generalized): Secondary | ICD-10-CM | POA: Insufficient documentation

## 2021-06-01 DIAGNOSIS — G8929 Other chronic pain: Secondary | ICD-10-CM | POA: Diagnosis present

## 2021-06-01 DIAGNOSIS — M5442 Lumbago with sciatica, left side: Secondary | ICD-10-CM | POA: Insufficient documentation

## 2021-06-01 DIAGNOSIS — R293 Abnormal posture: Secondary | ICD-10-CM | POA: Insufficient documentation

## 2021-06-01 DIAGNOSIS — R262 Difficulty in walking, not elsewhere classified: Secondary | ICD-10-CM | POA: Insufficient documentation

## 2021-06-01 NOTE — Therapy (Signed)
[facility] [address]  [place], [place], [address] Phone: [phone]   Fax:  [phone]  Physical Therapy Treatment  Patient Details  Name: Margaret [name] MRN: [id number] Date of Birth: Jan 26, 1951 Referring Provider (PT): Joni Fears MD   Encounter Date: 06/01/2021   PT End of Session - 06/01/21 1602     Visit Number 2    Number of Visits 12    Date for PT Re-Evaluation 07/14/21    Authorization Type [employer] VL [id number]    PT Start Time 1442    PT Stop Time 7209    PT Time Calculation (min) 48 min    Activity Tolerance Patient tolerated treatment well    Behavior During Therapy [facility] for tasks assessed/performed             Past Medical History:  Diagnosis Date   Allergic state 03/25/2012   Allergy    Aortic calcification ([place]) 08/27/2014   Atherosclerosis of aorta ([place]) 04/12/2014   Back pain    Constipation 01/23/2017   GERD (gastroesophageal reflux disease)    Hiatal hernia with gastroesophageal reflux 04/27/2010   Qualifier: Diagnosis of  By: Danelle Earthly CMA, Darlene  Failed Omeprazole and Protonix    Hx of adenomatous polyp of colon 01/23/2017   Colonoscopy 01/16/2017 with 2 polyps one was adenomatous. recommeded follow up in 5 years.performed by Dr Silverio Decamp   Hyperlipidemia    Hypertension    Joint pain    Osteopenia 08/31/2012   Peripheral edema 12/13/2014   Sleep apnea    Unspecified sinusitis (chronic) 03/30/2013    Past Surgical History:  Procedure Laterality Date   24 HOUR [place] STUDY N/A 12/15/2015   Procedure: 24 HOUR PH STUDY;  Surgeon: Mauri Pole, MD;  [facility];  Service: Endoscopy;  Laterality: N/A;   DILATION AND CURETTAGE OF UTERUS     ESOPHAGEAL MANOMETRY N/A 12/15/2015   Procedure: ESOPHAGEAL MANOMETRY (EM);  Surgeon: Mauri Pole, MD;  [facility];  Service: Endoscopy;  Laterality: N/A;    There were no vitals filed for this visit.   Subjective  Assessment - 06/01/21 1445     Subjective Pt reports that she can't stand for long periods of time and steps give her a hard time.    Pertinent History HTN, history CVA/TIA, OSA, HTN    Diagnostic tests 04/27/21 - MRI - IMPRESSION:  Left facet arthropathy at L4-L5 with some marrow edema. No  significant stenosis at any level.    Patient Stated Goals move around with less pain, be more flexible    Currently in Pain? Yes    Pain Score 7     Pain Location Back    Pain Orientation Lower    Pain Descriptors / Indicators Tightness;Aching    Pain Type Chronic pain                               [facility] Adult PT Treatment/Exercise - 06/01/21 0001       Exercises   Exercises Lumbar      Lumbar Exercises: Stretches   Passive Hamstring Stretch Right;Left;30 seconds    Passive Hamstring Stretch Limitations hooklying with strap    Single Knee to Chest Stretch Right;Left;2 reps;10 seconds    Pelvic Tilt 10 reps;5 seconds    Pelvic Tilt Limitations posterior    Other Lumbar Stretch Exercise seated fwd flexion and L QL stretch  with green Pball 3x10"      Lumbar Exercises: Aerobic   Recumbent Bike L1x69min      Lumbar Exercises: Seated   Other Seated Lumbar Exercises shoulder squeezes 10x3"      Lumbar Exercises: Sidelying   Clam Left;10 reps;2 seconds                       PT Short Term Goals - 06/01/21 1651       PT SHORT TERM GOAL #1   Title Patient to be independent with initial HEP    Time 3    Period Weeks    Status On-going    Target Date 06/09/21      PT SHORT TERM GOAL #2   Title Patient to demonstrate good posture and body mechanics to reduced stressors placed on low back musculature for reduced pain    Baseline noted trunk shift to R    Time 3    Period Weeks    Status On-going    Target Date 06/16/21               PT Long Term Goals - 06/01/21 1653       PT LONG TERM GOAL #1   Title Patient to be independent with advanced  HEP to improve outcomes    Time 8    Period Weeks    Status On-going    Target Date 07/14/21      PT LONG TERM GOAL #2   Title Patient wil report 75% improvement in LBP with activities.    Time 8    Period Weeks    Status On-going    Target Date 07/14/21      PT LONG TERM GOAL #3   Title Patient to report ability to perform transfers from various positions with no increase in pain    Baseline very stiff with transitional movements.    Time 8    Period Weeks    Status On-going    Target Date 07/14/21      PT LONG TERM GOAL #4   Title Patient will demonstrate improved functional LE strength by completing 5xSTS in < 15 seconds    Baseline 26 seconds, slow gaurded movements    Time 8    Period Weeks    Status On-going    Target Date 07/14/21      PT LONG TERM GOAL #5   Title Pt. will demonstrate improved functional ability by improving FOTO score to 54% or better.    Baseline 44%    Time 8    Period Weeks    Status On-going    Target Date 07/14/21                   Plan - 06/01/21 1617     Clinical Impression Statement Pt presented with lateral WS to the L with her trunk, we did some general lumbopelvic exercises and some stretching to help reduce tightness and improve pain levels. Her L QL may be tight from her increased WS to that side. She did well with the exercises today but with some reports of "pulling" in the low back. Provided her with HEP and gave education on the exercises, she was w/o any complaints during the session.    Personal Factors and Comorbidities Comorbidity 3+    Comorbidities history chronic LBP, HTN, history of CVA/TIA, obesity    PT Frequency 2x / week    PT Duration 8 weeks  PT Treatment/Interventions ADLs/Self Care Home Management;Cryotherapy;Electrical Stimulation;Moist Heat;Traction;Ultrasound;Gait training;Stair training;Functional mobility training;Therapeutic activities;Therapeutic exercise;Balance training;Neuromuscular  re-education;Patient/family education;Manual techniques;Passive range of motion;Dry needling;Taping;Spinal Manipulations;Joint Manipulations    PT Next Visit Plan Review HEP; progress core stab exercises;  Manual/modalities PRN    Consulted and Agree with Plan of Care Patient             Patient will benefit from skilled therapeutic intervention in order to improve the following deficits and impairments:  Decreased endurance, Decreased mobility, Difficulty walking, Increased muscle spasms, Hypomobility, Decreased range of motion, Decreased activity tolerance, Decreased strength, Increased fascial restricitons, Impaired flexibility, Postural dysfunction, Pain  Visit Diagnosis: Chronic midline low back pain with left-sided sciatica  Muscle weakness (generalized)  Abnormal posture  Difficulty in walking, not elsewhere classified     Problem List Patient Active Problem List   Diagnosis Date Noted   Vertigo 05/17/2021   Low back pain 04/07/2021   COVID-19 02/21/2021   UTI (urinary tract infection) 08/01/2020   Muscle cramps 06/24/2020   Cough 06/22/2019   Insomnia 05/29/2019   Educated about COVID-19 virus infection 09/20/2018   Headache 04/30/2018   Other fatigue 04/03/2018   Dyspnea 04/03/2018   Obstructive sleep apnea syndrome 04/03/2018   Vitamin D deficiency 04/03/2018   Obesity 01/22/2018   Neck pain 12/16/2017   Fatty infiltration of liver 12/16/2017   Hx of colonic polyps 01/23/2017   Constipation 01/23/2017   Cervical radiculopathy    Stroke (cerebrum) ([place]) 09/06/2016   TIA (transient ischemic attack) 09/06/2016   Right hand weakness    Peripheral edema 12/13/2014   Aortic atherosclerosis ([place]) 04/12/2014   Skin lesion of back 03/31/2014   Joint stiffness 03/31/2014   Dysuria 02/08/2014   Essential hypertension 02/08/2014   Atypical chest pain 01/21/2014   Hyperglycemia 07/27/2013   Sinusitis 03/30/2013   Preventative health care 03/30/2013   Left  shoulder pain 11/14/2012   Osteopenia 08/31/2012   Back pain 03/25/2012   Knee pain 03/25/2012   Allergic state 16/38/4665   Helicobacter pylori infection 06/08/2010   Hiatal hernia with gastroesophageal reflux 04/27/2010   Hyperlipidemia, mixed 04/07/2010    Artist Pais, PTA 06/01/2021, 5:09 PM  [facility] [address]  [place], [place], [address] Phone: [phone]   Fax:  [phone]  Name: Miabella Shannahan MRN: [id number] Date of Birth: [birth year]

## 2021-06-01 NOTE — Patient Instructions (Signed)
Access Code: [email or web address] URL: [email or web address] Date: 06/01/2021 Prepared by: Clarene Essex  Exercises Supine Lower Trunk Rotation - 1 x daily - 7 x weekly - 2 sets - 10 reps - 3 seconds hold Supine Posterior Pelvic Tilt - 1 x daily - 7 x weekly - 2 sets - 10 reps - 5 seconds hold Supine Hamstring Stretch with Strap - 1 x daily - 7 x weekly - 2 sets - 2 reps - 30 second hold Clamshell - 1 x daily - 7 x weekly - 2 sets - 10 reps - 3 second hold

## 2021-06-05 ENCOUNTER — Other Ambulatory Visit: Payer: Self-pay | Admitting: Family Medicine

## 2021-06-13 ENCOUNTER — Ambulatory Visit: Payer: Managed Care, Other (non HMO) | Admitting: Physical Therapy

## 2021-06-20 ENCOUNTER — Ambulatory Visit: Payer: Managed Care, Other (non HMO) | Admitting: Physical Therapy

## 2021-06-23 ENCOUNTER — Encounter: Payer: Self-pay | Admitting: Physical Therapy

## 2021-06-23 ENCOUNTER — Other Ambulatory Visit: Payer: Self-pay

## 2021-06-23 ENCOUNTER — Ambulatory Visit: Payer: Managed Care, Other (non HMO) | Admitting: Physical Therapy

## 2021-06-23 DIAGNOSIS — M5442 Lumbago with sciatica, left side: Secondary | ICD-10-CM | POA: Diagnosis not present

## 2021-06-23 DIAGNOSIS — R262 Difficulty in walking, not elsewhere classified: Secondary | ICD-10-CM

## 2021-06-23 DIAGNOSIS — M6281 Muscle weakness (generalized): Secondary | ICD-10-CM

## 2021-06-23 DIAGNOSIS — R293 Abnormal posture: Secondary | ICD-10-CM

## 2021-06-23 DIAGNOSIS — G8929 Other chronic pain: Secondary | ICD-10-CM

## 2021-06-23 NOTE — Patient Instructions (Signed)

## 2021-06-23 NOTE — Therapy (Signed)
[facility] [address]  [place], [place], [address] Phone: [phone]   Fax:  [phone]  Physical Therapy Treatment  Patient Details  Name: Margaret [name] MRN: [id number] Date of Birth: 10/21/1950 Referring Provider (PT): Joni Fears MD   Encounter Date: 06/23/2021   PT End of Session - 06/23/21 1548     Visit Number 3    Number of Visits 12    Date for PT Re-Evaluation 07/14/21    Authorization Type [employer] VL [id number]    PT Start Time 1536    PT Stop Time 1618    PT Time Calculation (min) 42 min    Activity Tolerance Patient tolerated treatment well    Behavior During Therapy [facility] for tasks assessed/performed             Past Medical History:  Diagnosis Date   Allergic state 03/25/2012   Allergy    Aortic calcification ([place]) 08/27/2014   Atherosclerosis of aorta ([place]) 04/12/2014   Back pain    Constipation 01/23/2017   GERD (gastroesophageal reflux disease)    Hiatal hernia with gastroesophageal reflux 04/27/2010   Qualifier: Diagnosis of  By: Danelle Earthly CMA, Darlene  Failed Omeprazole and Protonix    Hx of adenomatous polyp of colon 01/23/2017   Colonoscopy 01/16/2017 with 2 polyps one was adenomatous. recommeded follow up in 5 years.performed by Dr Silverio Decamp   Hyperlipidemia    Hypertension    Joint pain    Osteopenia 08/31/2012   Peripheral edema 12/13/2014   Sleep apnea    Unspecified sinusitis (chronic) 03/30/2013    Past Surgical History:  Procedure Laterality Date   24 HOUR [facility] STUDY N/A 12/15/2015   Procedure: 24 HOUR PH STUDY;  Surgeon: Mauri Pole, MD;  Location: [facility];  Service: Endoscopy;  Laterality: N/A;   DILATION AND CURETTAGE OF UTERUS     ESOPHAGEAL MANOMETRY N/A 12/15/2015   Procedure: ESOPHAGEAL MANOMETRY (EM);  Surgeon: Mauri Pole, MD;  Location: [facility];  Service: Endoscopy;  Laterality: N/A;    There were no vitals filed for this visit.   Subjective  Assessment - 06/23/21 1547     Subjective Patient reports her back is up and down, but feels the exercises do help.  Also reports occasional pelvic pain as well.    Pertinent History HTN, history CVA/TIA, OSA, HTN    Diagnostic tests 04/27/21 - MRI - IMPRESSION:  Left facet arthropathy at L4-L5 with some marrow edema. No  significant stenosis at any level.    Patient Stated Goals move around with less pain, be more flexible    Currently in Pain? Yes    Pain Score 6     Pain Location Back    Pain Orientation Lower                               [facility] Adult PT Treatment/Exercise - 06/23/21 0001       Exercises   Exercises Lumbar      Lumbar Exercises: Stretches   Lower Trunk Rotation Limitations x 10 bil no hold      Lumbar Exercises: Aerobic   Other Aerobic Exercise gait x 6 min (915ft)      Lumbar Exercises: Seated   Sit to Stand 10 reps    Sit to Stand Limitations cues for technique, noted decreased LBP      Lumbar Exercises: Supine   Pelvic  Tilt 10 reps    Pelvic Tilt Limitations tactile cues needed    Clam 10 reps    Clam Limitations tactile and VC needed for TrA contraction, RTB      Lumbar Exercises: Sidelying   Clam Both;10 reps    Clam Limitations RTB      Manual Therapy   Manual Therapy Joint mobilization;Soft tissue mobilization    Manual therapy comments to LB to decrease pain and improve lumbar mobility    Joint Mobilization PA mobs L3-L5 grade 2-3    Soft tissue mobilization IASTM with foam roller to bil glutes, lumbar paraspinals and QLs.  STM to lumbar paraspinals, noted trigger points R L4/5                     PT Education - 06/23/21 1635     Education Details education on dry needling risks and indications.    Person(s) Educated Patient    Methods Explanation;Handout    Comprehension Verbalized understanding              PT Short Term Goals - 06/01/21 1651       PT SHORT TERM GOAL #1   Title Patient to be  independent with initial HEP    Time 3    Period Weeks    Status On-going    Target Date 06/09/21      PT SHORT TERM GOAL #2   Title Patient to demonstrate good posture and body mechanics to reduced stressors placed on low back musculature for reduced pain    Baseline noted trunk shift to R    Time 3    Period Weeks    Status On-going    Target Date 06/16/21               PT Long Term Goals - 06/01/21 1653       PT LONG TERM GOAL #1   Title Patient to be independent with advanced HEP to improve outcomes    Time 8    Period Weeks    Status On-going    Target Date 07/14/21      PT LONG TERM GOAL #2   Title Patient wil report 75% improvement in LBP with activities.    Time 8    Period Weeks    Status On-going    Target Date 07/14/21      PT LONG TERM GOAL #3   Title Patient to report ability to perform transfers from various positions with no increase in pain    Baseline very stiff with transitional movements.    Time 8    Period Weeks    Status On-going    Target Date 07/14/21      PT LONG TERM GOAL #4   Title Patient will demonstrate improved functional LE strength by completing 5xSTS in < 15 seconds    Baseline 26 seconds, slow gaurded movements    Time 8    Period Weeks    Status On-going    Target Date 07/14/21      PT LONG TERM GOAL #5   Title Pt. will demonstrate improved functional ability by improving FOTO score to 54% or better.    Baseline 44%    Time 8    Period Weeks    Status On-going    Target Date 07/14/21                   Plan - 06/23/21 1629  Clinical Impression Statement Pt. reports missing multiple visits due to illness and also vertigo, not feeling comfortable driving.  She has been doing her exercises and reports this helps.  Today reviewed neutral spine exercises, she had a lot of difficulty with posterior pelvic tilt and engaging TrA muscles, even with tactile cues, but overall tolerated exercises well.  She responded  well to manual therapy today, reporting decreased pain.  We discussed dry needling to lumbar paraspinals as well, she will think about it.  She would benefit from continued skilled therapy.    Personal Factors and Comorbidities Comorbidity 3+    Comorbidities history chronic LBP, HTN, history of CVA/TIA, obesity    PT Frequency 2x / week    PT Duration 8 weeks    PT Treatment/Interventions ADLs/Self Care Home Management;Cryotherapy;Electrical Stimulation;Moist Heat;Traction;Ultrasound;Gait training;Stair training;Functional mobility training;Therapeutic activities;Therapeutic exercise;Balance training;Neuromuscular re-education;Patient/family education;Manual techniques;Passive range of motion;Dry needling;Taping;Spinal Manipulations;Joint Manipulations    PT Next Visit Plan Review HEP; progress core stab exercises;  Manual/modalities PRN    Consulted and Agree with Plan of Care Patient             Patient will benefit from skilled therapeutic intervention in order to improve the following deficits and impairments:  Decreased endurance, Decreased mobility, Difficulty walking, Increased muscle spasms, Hypomobility, Decreased range of motion, Decreased activity tolerance, Decreased strength, Increased fascial restricitons, Impaired flexibility, Postural dysfunction, Pain  Visit Diagnosis: Chronic midline low back pain with left-sided sciatica  Muscle weakness (generalized)  Abnormal posture  Difficulty in walking, not elsewhere classified     Problem List Patient Active Problem List   Diagnosis Date Noted   Vertigo 05/17/2021   Low back pain 04/07/2021   COVID-19 02/21/2021   UTI (urinary tract infection) 08/01/2020   Muscle cramps 06/24/2020   Cough 06/22/2019   Insomnia 05/29/2019   Educated about COVID-19 virus infection 09/20/2018   Headache 04/30/2018   Other fatigue 04/03/2018   Dyspnea 04/03/2018   Obstructive sleep apnea syndrome 04/03/2018   Vitamin D deficiency  04/03/2018   Obesity 01/22/2018   Neck pain 12/16/2017   Fatty infiltration of liver 12/16/2017   Hx of colonic polyps 01/23/2017   Constipation 01/23/2017   Cervical radiculopathy    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   TIA (transient ischemic attack) 09/06/2016   Right hand weakness    Peripheral edema 12/13/2014   Aortic atherosclerosis ([place]) 04/12/2014   Skin lesion of back 03/31/2014   Joint stiffness 03/31/2014   Dysuria 02/08/2014   Essential hypertension 02/08/2014   Atypical chest pain 01/21/2014   Hyperglycemia 07/27/2013   Sinusitis 03/30/2013   Preventative health care 03/30/2013   Left shoulder pain 11/14/2012   Osteopenia 08/31/2012   Back pain 03/25/2012   Knee pain 03/25/2012   Allergic state 56/38/9373   Helicobacter pylori infection 06/08/2010   Hiatal hernia with gastroesophageal reflux 04/27/2010   Hyperlipidemia, mixed 04/07/2010    Rennie Natter, PT, DPT  06/23/2021, 4:36 PM  [facility] [address]  [place], [place], [address] Phone: [phone]   Fax:  [phone]  Name: Margaret [name] MRN: [id number] Date of Birth: 1950/11/30

## 2021-06-27 ENCOUNTER — Ambulatory Visit: Payer: Managed Care, Other (non HMO) | Admitting: Physical Therapy

## 2021-06-27 ENCOUNTER — Encounter: Payer: Self-pay | Admitting: Physical Therapy

## 2021-06-27 ENCOUNTER — Other Ambulatory Visit: Payer: Self-pay

## 2021-06-27 DIAGNOSIS — R262 Difficulty in walking, not elsewhere classified: Secondary | ICD-10-CM

## 2021-06-27 DIAGNOSIS — M5442 Lumbago with sciatica, left side: Secondary | ICD-10-CM | POA: Diagnosis not present

## 2021-06-27 DIAGNOSIS — G8929 Other chronic pain: Secondary | ICD-10-CM

## 2021-06-27 DIAGNOSIS — R293 Abnormal posture: Secondary | ICD-10-CM

## 2021-06-27 DIAGNOSIS — M6281 Muscle weakness (generalized): Secondary | ICD-10-CM

## 2021-06-27 NOTE — Therapy (Signed)
[name] [address]  [name], [place], [address] Phone: [phone]   Fax:  [phone]  Physical Therapy Treatment  Patient Details  Name: Margaret [name] MRN: [id number] Date of Birth: 07/09/1950 Referring Provider (PT): Joni Fears MD   Encounter Date: 06/27/2021   PT End of Session - 06/27/21 1458     Visit Number 4    Number of Visits 12    Date for PT Re-Evaluation 07/14/21    Authorization Type [employer] VL [id number]    PT Start Time 1450    PT Stop Time 6144    PT Time Calculation (min) 40 min    Activity Tolerance Patient tolerated treatment well    Behavior During Therapy [facility] for tasks assessed/performed             Past Medical History:  Diagnosis Date   Allergic state 03/25/2012   Allergy    Aortic calcification ([place]) 08/27/2014   Atherosclerosis of aorta ([place]) 04/12/2014   Back pain    Constipation 01/23/2017   GERD (gastroesophageal reflux disease)    Hiatal hernia with gastroesophageal reflux 04/27/2010   Qualifier: Diagnosis of  By: Danelle Earthly CMA, Darlene  Failed Omeprazole and Protonix    Hx of adenomatous polyp of colon 01/23/2017   Colonoscopy 01/16/2017 with 2 polyps one was adenomatous. recommeded follow up in 5 years.performed by Dr Silverio Decamp   Hyperlipidemia    Hypertension    Joint pain    Osteopenia 08/31/2012   Peripheral edema 12/13/2014   Sleep apnea    Unspecified sinusitis (chronic) 03/30/2013    Past Surgical History:  Procedure Laterality Date   24 HOUR [facility] STUDY N/A 12/15/2015   Procedure: 24 HOUR PH STUDY;  Surgeon: Mauri Pole, MD;  [facility];  Service: Endoscopy;  Laterality: N/A;   DILATION AND CURETTAGE OF UTERUS     ESOPHAGEAL MANOMETRY N/A 12/15/2015   Procedure: ESOPHAGEAL MANOMETRY (EM);  Surgeon: Mauri Pole, MD;  Location: [facility];  Service: Endoscopy;  Laterality: N/A;    There were no vitals filed for this visit.   Subjective  Assessment - 06/27/21 1458     Subjective Pt. reports massage last session really helped back.    Pertinent History HTN, history CVA/TIA, OSA, HTN    Diagnostic tests 04/27/21 - MRI - IMPRESSION:  Left facet arthropathy at L4-L5 with some marrow edema. No  significant stenosis at any level.    Patient Stated Goals move around with less pain, be more flexible    Currently in Pain? Yes    Pain Score 6     Pain Location Back                               [facility] Adult PT Treatment/Exercise - 06/27/21 0001       Exercises   Exercises Lumbar      Lumbar Exercises: Stretches   Single Knee to Chest Stretch Right;Left;2 reps;10 seconds    Lower Trunk Rotation Limitations x 10 bil no hold    Figure 4 Stretch 2 reps;10 seconds;With overpressure      Lumbar Exercises: Aerobic   Other Aerobic Exercise gait x 6 min (987ft)      Lumbar Exercises: Supine   Pelvic Tilt 10 reps    Pelvic Tilt Limitations tactile cues, 5 sec hold, cues for breathing.    Bridge 10 reps    [address]  Limitations slight increase pain, limited raise, with    Other Supine Lumbar Exercises bent knee fall-outs x 10 bil      Lumbar Exercises: Sidelying   Clam Both;10 reps    Clam Limitations RTB      Manual Therapy   Manual Therapy Joint mobilization;Soft tissue mobilization    Manual therapy comments to LB to decrease pain and improve lumbar mobility    Joint Mobilization PA mobs L3-L5 grade 2-3    Soft tissue mobilization IASTM with foam roller to bil glutes, lumbar paraspinals and QLs.  STM to lumbar paraspinals, noted trigger points R L4/5                     PT Education - 06/27/21 1515     Education Details HEP update Access Code: [email or web address]    Person(s) Educated Patient    Methods Explanation;Demonstration;Verbal cues;Handout    Comprehension Verbalized understanding;Returned demonstration              PT Short Term Goals - 06/01/21 1651       PT SHORT TERM GOAL #1    Title Patient to be independent with initial HEP    Time 3    Period Weeks    Status On-going    Target Date 06/09/21      PT SHORT TERM GOAL #2   Title Patient to demonstrate good posture and body mechanics to reduced stressors placed on low back musculature for reduced pain    Baseline noted trunk shift to R    Time 3    Period Weeks    Status On-going    Target Date 06/16/21               PT Long Term Goals - 06/01/21 1653       PT LONG TERM GOAL #1   Title Patient to be independent with advanced HEP to improve outcomes    Time 8    Period Weeks    Status On-going    Target Date 07/14/21      PT LONG TERM GOAL #2   Title Patient wil report 75% improvement in LBP with activities.    Time 8    Period Weeks    Status On-going    Target Date 07/14/21      PT LONG TERM GOAL #3   Title Patient to report ability to perform transfers from various positions with no increase in pain    Baseline very stiff with transitional movements.    Time 8    Period Weeks    Status On-going    Target Date 07/14/21      PT LONG TERM GOAL #4   Title Patient will demonstrate improved functional LE strength by completing 5xSTS in < 15 seconds    Baseline 26 seconds, slow gaurded movements    Time 8    Period Weeks    Status On-going    Target Date 07/14/21      PT LONG TERM GOAL #5   Title Pt. will demonstrate improved functional ability by improving FOTO score to 54% or better.    Baseline 44%    Time 8    Period Weeks    Status On-going    Target Date 07/14/21                   Plan - 06/27/21 1536     Clinical Impression Statement Patient reports good relief from manual therapy last  session, so declined to try dry needling today.  She was able to activate TrA muscles today with neutral spine exercises much better, updated HEP today with exercises, followed by manual therapy again to low back, after which reported decreased pain and spasm.    Personal Factors and  Comorbidities Comorbidity 3+    Comorbidities history chronic LBP, HTN, history of CVA/TIA, obesity    PT Frequency 2x / week    PT Duration 8 weeks    PT Treatment/Interventions ADLs/Self Care Home Management;Cryotherapy;Electrical Stimulation;Moist Heat;Traction;Ultrasound;Gait training;Stair training;Functional mobility training;Therapeutic activities;Therapeutic exercise;Balance training;Neuromuscular re-education;Patient/family education;Manual techniques;Passive range of motion;Dry needling;Taping;Spinal Manipulations;Joint Manipulations    PT Next Visit Plan Review HEP; progress core stab exercises;  Manual/modalities PRN    Consulted and Agree with Plan of Care Patient             Patient will benefit from skilled therapeutic intervention in order to improve the following deficits and impairments:  Decreased endurance, Decreased mobility, Difficulty walking, Increased muscle spasms, Hypomobility, Decreased range of motion, Decreased activity tolerance, Decreased strength, Increased fascial restricitons, Impaired flexibility, Postural dysfunction, Pain  Visit Diagnosis: Chronic midline low back pain with left-sided sciatica  Muscle weakness (generalized)  Abnormal posture  Difficulty in walking, not elsewhere classified     Problem List Patient Active Problem List   Diagnosis Date Noted   Vertigo 05/17/2021   Low back pain 04/07/2021   COVID-19 02/21/2021   UTI (urinary tract infection) 08/01/2020   Muscle cramps 06/24/2020   Cough 06/22/2019   Insomnia 05/29/2019   Educated about COVID-19 virus infection 09/20/2018   Headache 04/30/2018   Other fatigue 04/03/2018   Dyspnea 04/03/2018   Obstructive sleep apnea syndrome 04/03/2018   Vitamin D deficiency 04/03/2018   Obesity 01/22/2018   Neck pain 12/16/2017   Fatty infiltration of liver 12/16/2017   Hx of colonic polyps 01/23/2017   Constipation 01/23/2017   Cervical radiculopathy    Stroke (cerebrum) ([place])  09/06/2016   TIA (transient ischemic attack) 09/06/2016   Right hand weakness    Peripheral edema 12/13/2014   Aortic atherosclerosis ([place]) 04/12/2014   Skin lesion of back 03/31/2014   Joint stiffness 03/31/2014   Dysuria 02/08/2014   Essential hypertension 02/08/2014   Atypical chest pain 01/21/2014   Hyperglycemia 07/27/2013   Sinusitis 03/30/2013   Preventative health care 03/30/2013   Left shoulder pain 11/14/2012   Osteopenia 08/31/2012   Back pain 03/25/2012   Knee pain 03/25/2012   Allergic state 56/97/9480   Helicobacter pylori infection 06/08/2010   Hiatal hernia with gastroesophageal reflux 04/27/2010   Hyperlipidemia, mixed 04/07/2010    Rennie Natter, PT, DPT 06/27/2021, 3:38 [name] [address]  [place], [place], [address] Phone: [phone]   Fax:  [phone]  Name: Margaret [name] MRN: [id number] Date of Birth: 1950/09/26

## 2021-06-27 NOTE — Patient Instructions (Signed)
Access Code: [email or web address] URL: [email or web address] Date: 06/27/2021 Prepared by: Glenetta Hew  Exercises Supine Lower Trunk Rotation - 1 x daily - 7 x weekly - 2 sets - 10 reps Supine Figure 4 Piriformis Stretch - 1 x daily - 7 x weekly - 1 sets - 3 reps - 15-30 sec hold Supine Posterior Pelvic Tilt - 1 x daily - 7 x weekly - 2 sets - 10 reps - 5 sec hold Bent Knee Fallouts - 1 x daily - 7 x weekly - 1 sets - 10 reps Clamshell - 1 x daily - 7 x weekly - 2 sets - 10 reps

## 2021-06-30 ENCOUNTER — Ambulatory Visit: Payer: Managed Care, Other (non HMO) | Attending: Orthopaedic Surgery

## 2021-06-30 ENCOUNTER — Other Ambulatory Visit: Payer: Self-pay

## 2021-06-30 DIAGNOSIS — R262 Difficulty in walking, not elsewhere classified: Secondary | ICD-10-CM | POA: Diagnosis present

## 2021-06-30 DIAGNOSIS — G8929 Other chronic pain: Secondary | ICD-10-CM | POA: Insufficient documentation

## 2021-06-30 DIAGNOSIS — R293 Abnormal posture: Secondary | ICD-10-CM | POA: Diagnosis present

## 2021-06-30 DIAGNOSIS — M5442 Lumbago with sciatica, left side: Secondary | ICD-10-CM | POA: Insufficient documentation

## 2021-06-30 DIAGNOSIS — M6281 Muscle weakness (generalized): Secondary | ICD-10-CM | POA: Insufficient documentation

## 2021-06-30 NOTE — Patient Instructions (Signed)
Access Code: [email or web address] URL: [email or web address] Date: 06/30/2021 Prepared by: Clarene Essex  Exercises Supine Lower Trunk Rotation - 1 x daily - 7 x weekly - 2 sets - 10 reps Supine Figure 4 Piriformis Stretch - 1 x daily - 7 x weekly - 1 sets - 3 reps - 15-30 sec hold Supine Posterior Pelvic Tilt - 1 x daily - 7 x weekly - 2 sets - 10 reps - 5 sec hold Bent Knee Fallouts - 1 x daily - 7 x weekly - 1 sets - 10 reps Clamshell - 1 x daily - 7 x weekly - 2 sets - 10 reps Sit to Stand with Hands on Knees - 1 x daily - 7 x weekly - 3 sets - 10 reps

## 2021-06-30 NOTE — Therapy (Signed)
[facility] [address]  [place], [place], [address] Phone: [phone]   Fax:  [phone]  Physical Therapy Treatment  Patient Details  Name: Margaret [name] MRN: [id number] Date of Birth: [birth year] Referring Provider (PT): Joni Fears MD   Encounter Date: 06/30/2021   PT End of Session - 06/30/21 1709     Visit Number 5    Number of Visits 12    Date for PT Re-Evaluation 07/14/21    Authorization Type [employer] [id number]    PT Start Time 1611    PT Stop Time 1657    PT Time Calculation (min) 46 min    Activity Tolerance Patient tolerated treatment well    Behavior During Therapy [facility] for tasks assessed/performed             Past Medical History:  Diagnosis Date   Allergic state 03/25/2012   Allergy    Aortic calcification ([place]) 08/27/2014   Atherosclerosis of aorta ([place]) 04/12/2014   Back pain    Constipation 01/23/2017   GERD (gastroesophageal reflux disease)    Hiatal hernia with gastroesophageal reflux 04/27/2010   Qualifier: Diagnosis of  By: Danelle Earthly CMA, Darlene  Failed Omeprazole and Protonix    Hx of adenomatous polyp of colon 01/23/2017   Colonoscopy 01/16/2017 with 2 polyps one was adenomatous. recommeded follow up in 5 years.performed by Dr Silverio Decamp   Hyperlipidemia    Hypertension    Joint pain    Osteopenia 08/31/2012   Peripheral edema 12/13/2014   Sleep apnea    Unspecified sinusitis (chronic) 03/30/2013    Past Surgical History:  Procedure Laterality Date   24 HOUR [place] STUDY N/A 12/15/2015   Procedure: 24 HOUR PH STUDY;  Surgeon: Mauri Pole, MD;  Location: [facility];  Service: Endoscopy;  Laterality: N/A;   DILATION AND CURETTAGE OF UTERUS     ESOPHAGEAL MANOMETRY N/A 12/15/2015   Procedure: ESOPHAGEAL MANOMETRY (EM);  Surgeon: Mauri Pole, MD;  Location: [facility];  Service: Endoscopy;  Laterality: N/A;    There were no vitals filed for this visit.   Subjective  Assessment - 06/30/21 1612     Subjective I feel my back is improving more, going to stand up too quick can hurt sometimes.    Pertinent History HTN, history CVA/TIA, OSA, HTN    Diagnostic tests 04/27/21 - MRI - IMPRESSION:  Left facet arthropathy at L4-L5 with some marrow edema. No  significant stenosis at any level.    Patient Stated Goals move around with less pain, be more flexible    Currently in Pain? Yes    Pain Score 5     Pain Orientation Lower    Pain Descriptors / Indicators Tightness;Aching    Pain Type Chronic pain                               [facility] Adult PT Treatment/Exercise - 06/30/21 0001       Lumbar Exercises: Stretches   Single Knee to Chest Stretch Right;Left;2 reps;20 seconds    Lower Trunk Rotation Limitations 5x5" hold R/L    Figure 4 Stretch 2 reps;20 seconds;Supine;With overpressure    Figure 4 Stretch Limitations supine    Other Lumbar Stretch Exercise seated fwd flexion and QL stretch with orange Pball 4x10"      Lumbar Exercises: Aerobic   Recumbent Bike L2x22mn      Lumbar Exercises:  Seated   Sit to Stand 10 reps    Sit to Stand Limitations cues to increase fwd WS    Other Seated Lumbar Exercises hip ADD with abd brace 10x5"      Lumbar Exercises: Supine   Bridge 10 reps      Manual Therapy   Manual Therapy Soft tissue mobilization    Soft tissue mobilization IASTM with foam roll to B glutes, lumbar paraspinals                     PT Education - 06/30/21 1657     Education Details STS added to HEP    Person(s) Educated Patient    Methods Explanation;Demonstration    Comprehension Verbalized understanding;Returned demonstration              PT Short Term Goals - 06/30/21 1750       PT SHORT TERM GOAL #1   Title Patient to be independent with initial HEP    Time 3    Period Weeks    Status Achieved    Target Date 06/09/21      PT SHORT TERM GOAL #2   Title Patient to demonstrate good posture  and body mechanics to reduced stressors placed on low back musculature for reduced pain    Baseline noted trunk shift to R    Time 3    Period Weeks    Status Partially Met   less WS noted to the R   Target Date 06/16/21               PT Long Term Goals - 06/01/21 1653       PT LONG TERM GOAL #1   Title Patient to be independent with advanced HEP to improve outcomes    Time 8    Period Weeks    Status On-going    Target Date 07/14/21      PT LONG TERM GOAL #2   Title Patient wil report 75% improvement in LBP with activities.    Time 8    Period Weeks    Status On-going    Target Date 07/14/21      PT LONG TERM GOAL #3   Title Patient to report ability to perform transfers from various positions with no increase in pain    Baseline very stiff with transitional movements.    Time 8    Period Weeks    Status On-going    Target Date 07/14/21      PT LONG TERM GOAL #4   Title Patient will demonstrate improved functional LE strength by completing 5xSTS in < 15 seconds    Baseline 26 seconds, slow gaurded movements    Time 8    Period Weeks    Status On-going    Target Date 07/14/21      PT LONG TERM GOAL #5   Title Pt. will demonstrate improved functional ability by improving FOTO score to 54% or better.    Baseline 44%    Time 8    Period Weeks    Status On-going    Target Date 07/14/21                   Plan - 06/30/21 1722     Clinical Impression Statement Pt reports that the STM from last session really helped with the stiffness in her lower back. She has intermittent reports of low back pulling with exercises. Good response to the [facility] today.  Tried hip ADD with abd brace in supine but increased pulling along the L QL, seated version was better tolerated. She could benefit from some more spinal mobs.    Personal Factors and Comorbidities Comorbidity 3+    Comorbidities history chronic LBP, HTN, history of CVA/TIA, obesity    PT Frequency 2x / week     PT Duration 8 weeks    PT Treatment/Interventions ADLs/Self Care Home Management;Cryotherapy;Electrical Stimulation;Moist Heat;Traction;Ultrasound;Gait training;Stair training;Functional mobility training;Therapeutic activities;Therapeutic exercise;Balance training;Neuromuscular re-education;Patient/family education;Manual techniques;Passive range of motion;Dry needling;Taping;Spinal Manipulations;Joint Manipulations    PT Next Visit Plan spinal mobs; progress core stab exercises;  Manual/modalities PRN    Consulted and Agree with Plan of Care Patient             Patient will benefit from skilled therapeutic intervention in order to improve the following deficits and impairments:  Decreased endurance, Decreased mobility, Difficulty walking, Increased muscle spasms, Hypomobility, Decreased range of motion, Decreased activity tolerance, Decreased strength, Increased fascial restricitons, Impaired flexibility, Postural dysfunction, Pain  Visit Diagnosis: Chronic midline low back pain with left-sided sciatica  Muscle weakness (generalized)  Abnormal posture  Difficulty in walking, not elsewhere classified     Problem List Patient Active Problem List   Diagnosis Date Noted   Vertigo 05/17/2021   Low back pain 04/07/2021   COVID-19 02/21/2021   UTI (urinary tract infection) 08/01/2020   Muscle cramps 06/24/2020   Cough 06/22/2019   Insomnia 05/29/2019   Educated about COVID-19 virus infection 09/20/2018   Headache 04/30/2018   Other fatigue 04/03/2018   Dyspnea 04/03/2018   Obstructive sleep apnea syndrome 04/03/2018   Vitamin D deficiency 04/03/2018   Obesity 01/22/2018   Neck pain 12/16/2017   Fatty infiltration of liver 12/16/2017   Hx of colonic polyps 01/23/2017   Constipation 01/23/2017   Cervical radiculopathy    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/06/2016   TIA (transient ischemic attack) 09/06/2016   Right hand weakness    Peripheral edema 12/13/2014   Aortic  atherosclerosis ([place]) 04/12/2014   Skin lesion of back 03/31/2014   Joint stiffness 03/31/2014   Dysuria 02/08/2014   Essential hypertension 02/08/2014   Atypical chest pain 01/21/2014   Hyperglycemia 07/27/2013   Sinusitis 03/30/2013   Preventative health care 03/30/2013   Left shoulder pain 11/14/2012   Osteopenia 08/31/2012   Back pain 03/25/2012   Knee pain 03/25/2012   Allergic state 69/62/9528   Helicobacter pylori infection 06/08/2010   Hiatal hernia with gastroesophageal reflux 04/27/2010   Hyperlipidemia, mixed 04/07/2010    Artist Pais, PTA 06/30/2021, 5:51 PM  [facility] [address]  [place], [place], [address] Phone: [phone]   Fax:  [phone]  Name: Margaret [name] MRN: [id number] Date of Birth: 1950/10/27

## 2021-07-07 ENCOUNTER — Encounter: Payer: Self-pay | Admitting: Family Medicine

## 2021-07-07 ENCOUNTER — Telehealth (INDEPENDENT_AMBULATORY_CARE_PROVIDER_SITE_OTHER): Payer: Managed Care, Other (non HMO) | Admitting: Family Medicine

## 2021-07-07 VITALS — Ht 62.0 in | Wt 190.0 lb

## 2021-07-07 DIAGNOSIS — R739 Hyperglycemia, unspecified: Secondary | ICD-10-CM

## 2021-07-07 DIAGNOSIS — I671 Cerebral aneurysm, nonruptured: Secondary | ICD-10-CM | POA: Diagnosis not present

## 2021-07-07 DIAGNOSIS — R590 Localized enlarged lymph nodes: Secondary | ICD-10-CM | POA: Diagnosis not present

## 2021-07-07 DIAGNOSIS — I10 Essential (primary) hypertension: Secondary | ICD-10-CM | POA: Diagnosis not present

## 2021-07-10 DIAGNOSIS — R590 Localized enlarged lymph nodes: Secondary | ICD-10-CM | POA: Insufficient documentation

## 2021-07-10 DIAGNOSIS — I671 Cerebral aneurysm, nonruptured: Secondary | ICD-10-CM | POA: Insufficient documentation

## 2021-07-10 NOTE — Progress Notes (Signed)
MyChart Video Visit    Virtual Visit via Video Note   This visit type was conducted due to national recommendations for restrictions regarding the COVID-19 Pandemic (e.g. social distancing) in an effort to limit this patient's exposure and mitigate transmission in our community. This patient is at least at moderate risk for complications without adequate follow up. This format is felt to be most appropriate for this patient at this time. Physical exam was limited by quality of the video and audio technology used for the visit. S Chism, CMA was able to get the patient set up on a video visit.  Patient location: home Patient and provider in visit Provider location: Office  I discussed the limitations of evaluation and management by telemedicine and the availability of in person appointments. The patient expressed understanding and agreed to proceed.  Visit Date: 07/07/2021  Today's healthcare provider: Penni Homans, MD     Subjective:    Patient ID: Margaret [name], adult    DOB: Feb 04, 1951, 71 y.o.   MRN: [id number]  Chief Complaint  Patient presents with   Hypertension    CT scan- neuro surgery referral     HPI Patient is in today for emergency room follow up. She was in [place] with family when she had an episode of both arms and both legs feeling weak. She also noted fatigue and a headache most notably at the back of her head. She is feeling better now. Denies CP/palp/SOB/congestion/fevers/GI or GU c/o. Taking meds as prescribed   Past Medical History:  Diagnosis Date   Allergic state 03/25/2012   Allergy    Aortic calcification ([place]) 08/27/2014   Atherosclerosis of aorta ([place]) 04/12/2014   Back pain    Constipation 01/23/2017   GERD (gastroesophageal reflux disease)    Hiatal hernia with gastroesophageal reflux 04/27/2010   Qualifier: Diagnosis of  By: Danelle Earthly CMA, Darlene  Failed Omeprazole and Protonix    Hx of adenomatous polyp of colon 01/23/2017   Colonoscopy  01/16/2017 with 2 polyps one was adenomatous. recommeded follow up in 5 years.performed by Dr Silverio Decamp   Hyperlipidemia    Hypertension    Joint pain    Osteopenia 08/31/2012   Peripheral edema 12/13/2014   Sleep apnea    Unspecified sinusitis (chronic) 03/30/2013    Past Surgical History:  Procedure Laterality Date   24 HOUR [place] STUDY N/A 12/15/2015   Procedure: 24 HOUR PH STUDY;  Surgeon: Mauri Pole, MD;  [facility];  Service: Endoscopy;  Laterality: N/A;   DILATION AND CURETTAGE OF UTERUS     ESOPHAGEAL MANOMETRY N/A 12/15/2015   Procedure: ESOPHAGEAL MANOMETRY (EM);  Surgeon: Mauri Pole, MD;  [facility];  Service: Endoscopy;  Laterality: N/A;    Family History  Problem Relation Age of Onset   Stroke Mother    High blood pressure Mother    High Cholesterol Mother    Lung cancer Father    Stroke Sister        54   Thyroid disease Sister    Diabetes Sister    Hypertension Sister    Hyperlipidemia Sister    Hypertension Sister    Sleep apnea Son    Obesity Son     Social History   Socioeconomic History   Marital status: Married    Spouse name: [name]   Number of children: 2   Years of education: 16   Highest education level: Not on file  Occupational History   Occupation: Optometrist  Comment: retired  Tobacco Use   Smoking status: Never   Smokeless tobacco: Never  Vaping Use   Vaping Use: Never used  Substance and Sexual Activity   Alcohol use: Yes    Alcohol/week: 0.0 standard drinks    Comment: Rarely   Drug use: No   Sexual activity: Not Currently    Comment: no dietary restrictions, lives with husand, works at [employer]  Other Topics Concern   Not on file  Social History Narrative   Lives with husband   Caffeine- tea, 2 cups daily   Social Determinants of Health   Financial Resource Strain: Not on file  Food Insecurity: Not on file  Transportation Needs: Not on file  Physical Activity: Not on file  Stress: Not on  file  Social Connections: Not on file  Intimate Partner Violence: Not on file    Outpatient Medications Prior to Visit  Medication Sig Dispense Refill   aspirin 81 MG chewable tablet Chew by mouth daily.     carvedilol (COREG) 6.25 MG tablet TAKE 1 TABLET BY MOUTH 2 TIMES DAILY WITH A MEAL. 180 tablet 1   fish oil-omega-3 fatty acids 1000 MG capsule Take 2 g by mouth daily.     meclizine (ANTIVERT) 25 MG tablet Take 1 tablet (25 mg total) by mouth 3 (three) times daily as needed for dizziness. 16 tablet 0   Multiple Vitamin (MULTIVITAMIN WITH MINERALS) TABS tablet Take 1 tablet by mouth daily.     omeprazole (PRILOSEC) 40 MG capsule Take 1 capsule (40 mg total) by mouth daily. 90 capsule 0   rosuvastatin (CRESTOR) 10 MG tablet TAKE 1 TABLET BY MOUTH EVERY DAY 30 tablet 6   tiZANidine (ZANAFLEX) 2 MG tablet Take 0.5-2 tablets (1-4 mg total) by mouth 2 (two) times daily as needed for muscle spasms. 30 tablet 1   VITAMIN D PO Take 2,500 Units by mouth daily.     Zinc 50 MG TABS Take 1 tablet by mouth daily.     No facility-administered medications prior to visit.    Allergies  Allergen Reactions   Lisinopril Cough   Potassium-Containing Compounds Rash    Review of Systems  Constitutional:  Positive for malaise/fatigue. Negative for fever.  HENT:  Negative for congestion.   Eyes:  Negative for blurred vision.  Respiratory:  Negative for shortness of breath.   Cardiovascular:  Negative for chest pain, palpitations and leg swelling.  Gastrointestinal:  Negative for abdominal pain, blood in stool and nausea.  Genitourinary:  Negative for dysuria and frequency.  Musculoskeletal:  Negative for falls.  Skin:  Negative for rash.  Neurological:  Positive for weakness and headaches. Negative for dizziness and loss of consciousness.  Endo/Heme/Allergies:  Negative for environmental allergies.  Psychiatric/Behavioral:  Negative for depression. The patient is not nervous/anxious.        Objective:    Physical Exam Constitutional:      General: She is not in acute distress.    Appearance: She is well-developed.  HENT:     Head: Normocephalic and atraumatic.  Eyes:     Conjunctiva/sclera: Conjunctivae normal.  Neck:     Thyroid: No thyromegaly.  Cardiovascular:     Rate and Rhythm: Normal rate and regular rhythm.     Heart sounds: Normal heart sounds. No murmur heard. Pulmonary:     Effort: Pulmonary effort is normal. No respiratory distress.     Breath sounds: Normal breath sounds.  Abdominal:     General: Bowel sounds are  normal. There is no distension.     Palpations: Abdomen is soft. There is no mass.     Tenderness: There is no abdominal tenderness.  Musculoskeletal:     Cervical back: Neck supple.  Lymphadenopathy:     Cervical: No cervical adenopathy.  Skin:    General: Skin is warm and dry.  Neurological:     Mental Status: She is alert and oriented to person, place, and time.  Psychiatric:        Behavior: Behavior normal.    Ht 5\' 2"  (1.575 m)    Wt 190 lb (86.2 kg)    BMI 34.75 kg/m  Wt Readings from Last 3 Encounters:  07/07/21 190 lb (86.2 kg)  05/17/21 192 lb 6.4 oz (87.3 kg)  04/25/21 190 lb (86.2 kg)    Diabetic Foot Exam - Simple   No data filed    Lab Results  Component Value Date   WBC 5.3 04/25/2021   HGB 13.0 04/25/2021   HCT 39.0 04/25/2021   PLT 209 04/25/2021   GLUCOSE 129 (H) 04/25/2021   CHOL 155 02/28/2021   TRIG 141.0 02/28/2021   HDL 46.30 02/28/2021   LDLCALC 80 02/28/2021   ALT 54 (H) 04/25/2021   AST 47 (H) 04/25/2021   NA 138 04/25/2021   K 3.9 04/25/2021   CL 103 04/25/2021   CREATININE 0.65 04/25/2021   BUN 18 04/25/2021   CO2 27 04/25/2021   TSH 1.74 02/28/2021   HGBA1C 6.4 02/28/2021   MICROALBUR 3.06 (H) 05/16/2011    Lab Results  Component Value Date   TSH 1.74 02/28/2021   Lab Results  Component Value Date   WBC 5.3 04/25/2021   HGB 13.0 04/25/2021   HCT 39.0 04/25/2021   MCV 87.8  04/25/2021   PLT 209 04/25/2021   Lab Results  Component Value Date   NA 138 04/25/2021   K 3.9 04/25/2021   CO2 27 04/25/2021   GLUCOSE 129 (H) 04/25/2021   BUN 18 04/25/2021   CREATININE 0.65 04/25/2021   BILITOT 0.5 04/25/2021   ALKPHOS 84 04/25/2021   AST 47 (H) 04/25/2021   ALT 54 (H) 04/25/2021   PROT 7.3 04/25/2021   ALBUMIN 3.8 04/25/2021   CALCIUM 9.0 04/25/2021   ANIONGAP 8 04/25/2021   GFR 91.20 02/28/2021   Lab Results  Component Value Date   CHOL 155 02/28/2021   Lab Results  Component Value Date   HDL 46.30 02/28/2021   Lab Results  Component Value Date   LDLCALC 80 02/28/2021   Lab Results  Component Value Date   TRIG 141.0 02/28/2021   Lab Results  Component Value Date   CHOLHDL 3 02/28/2021   Lab Results  Component Value Date   HGBA1C 6.4 02/28/2021       Assessment & Plan:   Problem List Items Addressed This Visit     Hyperglycemia    minimize simple carbs. Increase exercise as tolerated. Continue current meds      Essential hypertension    She reports good numbers generally since her hospital visit, she will monitor, no changes to meds. Encouraged heart healthy diet such as the DASH diet and exercise as tolerated.       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 She has not had persistent symptoms  but she is referred to neurosurgery for evaluation.       LAD (lymphadenopathy), anterior cervical    On right found incidentally on recent CTA of the neck 1.4 x 0.9 cm will monitor      Other Visit Diagnoses     Cerebral aneurysm    -  Primary   Relevant Orders   Ambulatory referral to Neurosurgery       I am having Margaret Minor "[name]" maintain her fish oil-omega-3 fatty acids, multivitamin with minerals, aspirin, VITAMIN D PO, Zinc,  omeprazole, tiZANidine, meclizine, carvedilol, and rosuvastatin.  No orders of the defined types were placed in this encounter.   I discussed the assessment and treatment plan with the patient. The patient was provided an opportunity to ask questions and all were answered. The patient agreed with the plan and demonstrated an understanding of the instructions.   The patient was advised to call back or seek an in-person evaluation if the symptoms worsen or if the condition fails to improve as anticipated.  I provided 22 minutes of face-to-face time during this encounter.   Penni Homans, MD [facility] [phone] (phone) [phone] (fax)  [place]

## 2021-07-10 NOTE — Assessment & Plan Note (Signed)
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 She has not had persistent symptoms but she is referred to neurosurgery for evaluation.

## 2021-07-10 NOTE — Assessment & Plan Note (Signed)
minimize simple carbs. Increase exercise as tolerated. Continue current meds  

## 2021-07-10 NOTE — Assessment & Plan Note (Signed)
On right found incidentally on recent CTA of the neck 1.4 x 0.9 cm will monitor

## 2021-07-10 NOTE — Assessment & Plan Note (Signed)
She reports good numbers generally since her hospital visit, she will monitor, no changes to meds. Encouraged heart healthy diet such as the DASH diet and exercise as tolerated.

## 2021-07-12 ENCOUNTER — Ambulatory Visit: Payer: Managed Care, Other (non HMO) | Admitting: Physical Therapy

## 2021-07-13 ENCOUNTER — Encounter: Payer: Self-pay | Admitting: Family Medicine

## 2021-07-14 NOTE — Telephone Encounter (Signed)
Patient was changed on 01/11/21 from amlodipine and metoprolol to Coreg 6.25mg  BID.

## 2021-07-15 ENCOUNTER — Other Ambulatory Visit: Payer: Self-pay

## 2021-07-15 MED ORDER — AMLODIPINE BESYLATE 5 MG PO TABS: 5.0000 mg | ORAL_TABLET | Freq: Every day | ORAL | 1 refills | Status: DC

## 2021-07-15 NOTE — Telephone Encounter (Signed)
Pt aware and done

## 2021-07-26 ENCOUNTER — Telehealth: Payer: Self-pay | Admitting: *Deleted

## 2021-07-26 NOTE — Telephone Encounter (Signed)
Notes regarding a recheck of labs were not found in pt's chart.  Called pt, no answer. Left VM requesting return call or MyChart message regarding labs.

## 2021-07-26 NOTE — Telephone Encounter (Signed)
Pt has lab appointment tomorrow but I do not see any future order in Epic.  Please place future order if appropriate or call pt to cancel appt if labs not needed at this time. Pt also has nurse visit tomorrow for BP check. Please advise.

## 2021-07-27 ENCOUNTER — Other Ambulatory Visit: Payer: Self-pay

## 2021-07-27 ENCOUNTER — Ambulatory Visit: Payer: Managed Care, Other (non HMO)

## 2021-07-27 ENCOUNTER — Encounter: Payer: Self-pay | Admitting: Physical Therapy

## 2021-07-27 ENCOUNTER — Ambulatory Visit: Payer: Managed Care, Other (non HMO) | Attending: Orthopaedic Surgery | Admitting: Physical Therapy

## 2021-07-27 ENCOUNTER — Other Ambulatory Visit: Payer: Managed Care, Other (non HMO)

## 2021-07-27 DIAGNOSIS — R293 Abnormal posture: Secondary | ICD-10-CM

## 2021-07-27 DIAGNOSIS — E782 Mixed hyperlipidemia: Secondary | ICD-10-CM

## 2021-07-27 DIAGNOSIS — E559 Vitamin D deficiency, unspecified: Secondary | ICD-10-CM

## 2021-07-27 DIAGNOSIS — R262 Difficulty in walking, not elsewhere classified: Secondary | ICD-10-CM

## 2021-07-27 DIAGNOSIS — M5442 Lumbago with sciatica, left side: Secondary | ICD-10-CM | POA: Insufficient documentation

## 2021-07-27 DIAGNOSIS — M6281 Muscle weakness (generalized): Secondary | ICD-10-CM | POA: Diagnosis present

## 2021-07-27 DIAGNOSIS — G8929 Other chronic pain: Secondary | ICD-10-CM | POA: Diagnosis present

## 2021-07-27 DIAGNOSIS — I10 Essential (primary) hypertension: Secondary | ICD-10-CM

## 2021-07-27 DIAGNOSIS — R739 Hyperglycemia, unspecified: Secondary | ICD-10-CM

## 2021-07-27 NOTE — Therapy (Signed)
[facility] [address]  [place], [place], [address] Phone: [phone]   Fax:  [phone]  Physical Therapy Treatment Progress Note Reporting Period 05/19/2021 to 07/27/2021  See note below for Objective Data and Assessment of Progress/Goals.     Patient Details  Name: Jaylenn Baiza MRN: [id number] Date of Birth: 10-Jul-1950 Referring Provider (PT): Joni Fears MD   Encounter Date: 07/27/2021   PT End of Session - 07/27/21 1538     Visit Number 6    Number of Visits 12    Date for PT Re-Evaluation 09/01/21    Authorization Type [employer] [id number]    PT Start Time 1534    PT Stop Time 1638    PT Time Calculation (min) 41 min    Activity Tolerance Patient tolerated treatment well    Behavior During Therapy [facility] for tasks assessed/performed             Past Medical History:  Diagnosis Date   Allergic state 03/25/2012   Allergy    Aortic calcification ([place]) 08/27/2014   Atherosclerosis of aorta ([place]) 04/12/2014   Back pain    Constipation 01/23/2017   GERD (gastroesophageal reflux disease)    Hiatal hernia with gastroesophageal reflux 04/27/2010   Qualifier: Diagnosis of  By: Danelle Earthly CMA, Darlene  Failed Omeprazole and Protonix    Hx of adenomatous polyp of colon 01/23/2017   Colonoscopy 01/16/2017 with 2 polyps one was adenomatous. recommeded follow up in 5 years.performed by Dr Silverio Decamp   Hyperlipidemia    Hypertension    Joint pain    Osteopenia 08/31/2012   Peripheral edema 12/13/2014   Sleep apnea    Unspecified sinusitis (chronic) 03/30/2013    Past Surgical History:  Procedure Laterality Date   24 HOUR [place] STUDY N/A 12/15/2015   Procedure: 24 HOUR [facility] STUDY;  Surgeon: Mauri Pole, MD;  Location: [facility];  Service: Endoscopy;  Laterality: N/A;   DILATION AND CURETTAGE OF UTERUS     ESOPHAGEAL MANOMETRY N/A 12/15/2015   Procedure: ESOPHAGEAL MANOMETRY (EM);  Surgeon: Mauri Pole,  MD;  Location: [facility];  Service: Endoscopy;  Laterality: N/A;    There were no vitals filed for this visit.   Subjective Assessment - 07/27/21 1536     Subjective Pt. reports that she went out of town and it took a bit to come back to PT.  Back is a little bit better, sometimes good time, sometimes bad.  Exercises in morning help a lot to loosen up.    Pertinent History HTN, history CVA/TIA, OSA, HTN    Diagnostic tests 04/27/21 - MRI - IMPRESSION:  Left facet arthropathy at L4-L5 with some marrow edema. No  significant stenosis at any level.    Patient Stated Goals move around with less pain, be more flexible    Currently in Pain? No/denies                [facility] PT Assessment - 07/27/21 0001       Assessment   Medical Diagnosis M54.50,G89.29 (ICD-10-CM) - Chronic bilateral low back pain without sciatica    Referring Provider (PT) Joni Fears MD    Hand Dominance Right      Precautions   Precautions None      Restrictions   Weight Bearing Restrictions No      Observation/Other Assessments   Focus on Therapeutic Outcomes (FOTO)  53%      6 minute walk test  results    Aerobic Endurance Distance Walked 900    Endurance additional comments reports no pain just pressure in lumbar spine.                           [place] Adult PT Treatment/Exercise - 07/27/21 0001       Self-Care   Self-Care Other Self-Care Comments    Other Self-Care Comments  see patient instructions      Therapeutic Activites    Therapeutic Activities Other Therapeutic Activities    Other Therapeutic Activities review of goals and progress, activity recommendations, FOTO      Lumbar Exercises: Aerobic   Nustep L5 x 6 min    Other Aerobic Exercise gait x 6 min (950f)      Lumbar Exercises: Seated   Sit to Stand 10 reps      Manual Therapy   Manual Therapy Soft tissue mobilization;Joint mobilization    Manual therapy comments to LB to decrease pain and improve lumbar  mobility    Joint Mobilization CPA and RUPA mobs L3-L5 grade 3-4    Soft tissue mobilization STM to lumbar paraspinals and bil QL                     PT Education - 07/27/21 1815     Education Details education on pain neuroscience, tissue sensitization, and need to increase activity level including starting walking program.    Person(s) Educated Patient    Methods Explanation    Comprehension Verbalized understanding              PT Short Term Goals - 06/30/21 1750       PT SHORT TERM GOAL #1   Title Patient to be independent with initial HEP    Time 3    Period Weeks    Status Achieved    Target Date 06/09/21      PT SHORT TERM GOAL #2   Title Patient to demonstrate good posture and body mechanics to reduced stressors placed on low back musculature for reduced pain    Baseline noted trunk shift to R    Time 3    Period Weeks    Status Partially Met   less WS noted to the R   Target Date 06/16/21               PT Long Term Goals - 07/27/21 1542       PT LONG TERM GOAL #1   Title Patient to be independent with advanced HEP to improve outcomes    Time 8    Period Weeks    Status On-going   07/27/21- met for current   Target Date 09/01/21      PT LONG TERM GOAL #2   Title Patient wil report 75% improvement in LBP with activities.    Time 4    Period Weeks    Status On-going   07/27/21- progress, 50-60%   Target Date 09/01/21      PT LONG TERM GOAL #3   Title Patient to report ability to perform transfers from various positions with no increase in pain    Baseline very stiff with transitional movements.    Time 4    Period Weeks    Status On-going   07/27/21- reports stiffness more than pain   Target Date 09/01/21      PT LONG TERM GOAL #4   Title Patient will demonstrate  improved functional LE strength by completing 5xSTS in < 15 seconds    Baseline 26 seconds, slow gaurded movements    Time 4    Period Weeks    Status Achieved   07/27/21-   trial 1 16 seconds, trial 2 14 seconds.  Some pressure in LB.   Target Date 07/14/21      PT LONG TERM GOAL #5   Title Pt. will demonstrate improved functional ability by improving FOTO score to 54% or better.    Baseline 44%    Time 4    Period Weeks    Status On-going   07/27/21-53%   Target Date 09/01/21      Additional Long Term Goals   Additional Long Term Goals Yes      PT LONG TERM GOAL #6   Title Pt. will be able to lift 10lbs from floor to counter to carry grocery bags.    Time 5    Period Weeks    Status New    Target Date 09/01/21                   Plan - 07/27/21 1810     Clinical Impression Statement Pt. reports she has been traveling resulting in absence from PT.  She does report 50-60% improvement in LBP, and her FOTO has improved to 53%.  She has also met LTG #4, demonstrating improved functional LE strength.  She is still limiting activities due to fear of movement whenever she feels pressure in low back.  Today started educationg on pain neuroscience with concept of sensitization.  She would benefit from additional 6 visits with focus on improving endurance and lifting tolerance.  Due to travel plans extending POC to 09/01/21.    Personal Factors and Comorbidities Comorbidity 3+    Comorbidities history chronic LBP, HTN, history of CVA/TIA, obesity    PT Frequency 2x / week    PT Duration 8 weeks    PT Treatment/Interventions ADLs/Self Care Home Management;Cryotherapy;Electrical Stimulation;Moist Heat;Traction;Ultrasound;Gait training;Stair training;Functional mobility training;Therapeutic activities;Therapeutic exercise;Balance training;Neuromuscular re-education;Patient/family education;Manual techniques;Passive range of motion;Dry needling;Taping;Spinal Manipulations;Joint Manipulations    PT Next Visit Plan spinal mobs; progress core stab exercises;  Manual/modalities PRN    Consulted and Agree with Plan of Care Patient             Patient will  benefit from skilled therapeutic intervention in order to improve the following deficits and impairments:  Decreased endurance, Decreased mobility, Difficulty walking, Increased muscle spasms, Hypomobility, Decreased range of motion, Decreased activity tolerance, Decreased strength, Increased fascial restricitons, Impaired flexibility, Postural dysfunction, Pain  Visit Diagnosis: Chronic midline low back pain with left-sided sciatica  Muscle weakness (generalized)  Abnormal posture  Difficulty in walking, not elsewhere classified     Problem List Patient Active Problem List   Diagnosis Date Noted   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   LAD (lymphadenopathy), anterior cervical 07/10/2021   Vertigo 05/17/2021   Low back pain 04/07/2021   COVID-19 02/21/2021   UTI (urinary tract infection) 08/01/2020   Muscle cramps 06/24/2020   Cough 06/22/2019   Insomnia 05/29/2019   Educated about COVID-19 virus infection 09/20/2018   Headache 04/30/2018   Other fatigue 04/03/2018   Dyspnea 04/03/2018   Obstructive sleep apnea syndrome 04/03/2018   Vitamin D deficiency 04/03/2018   Obesity 01/22/2018   Neck pain 12/16/2017   Fatty infiltration of liver 12/16/2017   Hx of colonic polyps 01/23/2017   Constipation 01/23/2017   Cervical radiculopathy  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   TIA (transient ischemic attack) 09/06/2016   Right hand weakness    Peripheral edema 12/13/2014   Aortic atherosclerosis ([place]) 04/12/2014   Skin lesion of back 03/31/2014   Joint stiffness 03/31/2014   Dysuria 02/08/2014   Essential hypertension 02/08/2014   Atypical chest pain 01/21/2014   Hyperglycemia 07/27/2013   Sinusitis 03/30/2013   Preventative health care 03/30/2013   Left shoulder pain 11/14/2012   Osteopenia 08/31/2012   Back pain 03/25/2012   Knee pain 03/25/2012   Allergic state 30/16/0109   Helicobacter pylori infection 06/08/2010   Hiatal hernia with gastroesophageal reflux 04/27/2010    Hyperlipidemia, mixed 04/07/2010    Rennie Natter, PT, DPT  07/27/2021, 6:17 PM  [facility] [address]  [place], [place], [address] Phone: [phone]   Fax:  [phone]  Name: Lavonna Lampron MRN: [id number] Date of Birth: July 23, 1950

## 2021-07-27 NOTE — Telephone Encounter (Signed)
Spoke with pt. Pt doesn't recall needing labs since she had recent labs. Pt requested appointment be canceled. Pt also requested BP check be rescheduled for next week. New appointment made for 08/05/21. ?

## 2021-08-05 ENCOUNTER — Ambulatory Visit (INDEPENDENT_AMBULATORY_CARE_PROVIDER_SITE_OTHER): Payer: Managed Care, Other (non HMO) | Admitting: Family Medicine

## 2021-08-05 VITALS — BP 142/80 | HR 85

## 2021-08-05 DIAGNOSIS — I10 Essential (primary) hypertension: Secondary | ICD-10-CM | POA: Diagnosis not present

## 2021-08-05 NOTE — Progress Notes (Signed)
Pt here for Blood pressure check per Mosie Lukes, MD ? ?Pt currently takes: Amlodipine '5MG'$ , Coreg 6.25 ? ?Pt restarted amlodipine '5MG'$  07/24/21. Pt is checking BP at home. Pt reports high home BP readings.  ? ?Pt reports compliance with medication. ?BP Readings from Last 3 Encounters:  ?05/17/21 124/60  ?04/25/21 (!) 139/57  ?03/22/21 132/70  ?  ? ? ?BP today: L arm 138/80 , R 140/78 after sitting for 5 minutes L 142/80 ? ?HR:85 ? ? ?Per covering provider Dr. Nani Ravens, pt advised to continue current medications. Follow up in one month. ? ? ?

## 2021-08-16 ENCOUNTER — Encounter: Payer: Self-pay | Admitting: Physical Therapy

## 2021-08-16 ENCOUNTER — Other Ambulatory Visit: Payer: Self-pay

## 2021-08-16 ENCOUNTER — Ambulatory Visit: Payer: Managed Care, Other (non HMO) | Admitting: Physical Therapy

## 2021-08-16 DIAGNOSIS — M5442 Lumbago with sciatica, left side: Secondary | ICD-10-CM | POA: Diagnosis not present

## 2021-08-16 DIAGNOSIS — M6281 Muscle weakness (generalized): Secondary | ICD-10-CM

## 2021-08-16 DIAGNOSIS — R293 Abnormal posture: Secondary | ICD-10-CM

## 2021-08-16 DIAGNOSIS — G8929 Other chronic pain: Secondary | ICD-10-CM

## 2021-08-16 DIAGNOSIS — R262 Difficulty in walking, not elsewhere classified: Secondary | ICD-10-CM

## 2021-08-16 NOTE — Patient Instructions (Signed)
Access Code: [email or web address] ?URL: [email or web address] ?Date: 08/16/2021 ?Prepared by: Glenetta Hew ? ?Exercises ?Supine Lower Trunk Rotation - 1 x daily - 7 x weekly - 2 sets - 10 reps ?Supine Figure 4 Piriformis Stretch - 1 x daily - 7 x weekly - 1 sets - 3 reps - 15-30 sec hold ?Supine Posterior Pelvic Tilt - 1 x daily - 7 x weekly - 2 sets - 10 reps - 5 sec hold ?Bent Knee Fallouts - 1 x daily - 7 x weekly - 1 sets - 10 reps ?Clamshell - 1 x daily - 7 x weekly - 2 sets - 10 reps ?Sit to Stand with Hands on Knees - 1 x daily - 7 x weekly - 3 sets - 10 reps ?Supine Bridge - 1 x daily - 7 x weekly - 2 sets - 10 reps ?Sidelying Open Book Thoracic Lumbar Rotation and Extension - 1 x daily - 7 x weekly - 3 sets - 10 reps ?Cat Cow - 1 x daily - 7 x weekly - 2 sets - 10 reps ?Beginner Front Arm Support - 1 x daily - 7 x weekly - 2 sets - 10 reps ?Child's Pose Stretch - 1 x daily - 7 x weekly - 1 sets - 3 reps - 10 sec hold ? ?

## 2021-08-16 NOTE — Therapy (Signed)
[facility] ?[place] ?[place], [place], [address] ?Phone: [phone]   Fax:  [phone] ? ?Physical Therapy Treatment ? ?Patient Details  ?Name: Margaret [name] ?MRN: [id number] ?Date of Birth: [birth year] ?Referring Provider (PT): Joni Fears MD ? ? ?Encounter Date: 08/16/2021 ? ? PT End of Session - 08/16/21 1021   ? ? Visit Number 7   ? Number of Visits 12   ? Date for PT Re-Evaluation 09/01/21   ? Authorization Type [employer] VL [id number]   ? PT Start Time 1017   ? PT Stop Time 1101   ? PT Time Calculation (min) 44 min   ? Activity Tolerance Patient tolerated treatment well   ? Behavior During Therapy [facility] for tasks assessed/performed   ? ?  ?  ? ?  ? ? ?Past Medical History:  ?Diagnosis Date  ? Allergic state 03/25/2012  ? Allergy   ? Aortic calcification ([place]) 08/27/2014  ? Atherosclerosis of aorta ([place]) 04/12/2014  ? Back pain   ? Constipation 01/23/2017  ? GERD (gastroesophageal reflux disease)   ? Hiatal hernia with gastroesophageal reflux 04/27/2010  ? Qualifier: Diagnosis of  By: Danelle Earthly CMA, Darlene  Failed Omeprazole and Protonix   ? Hx of adenomatous polyp of colon 01/23/2017  ? Colonoscopy 01/16/2017 with 2 polyps one was adenomatous. recommeded follow up in 5 years.performed by Dr Silverio Decamp  ? Hyperlipidemia   ? Hypertension   ? Joint pain   ? Osteopenia 08/31/2012  ? Peripheral edema 12/13/2014  ? Sleep apnea   ? Unspecified sinusitis (chronic) 03/30/2013  ? ? ?Past Surgical History:  ?Procedure Laterality Date  ? 76 HOUR [place] STUDY N/A 12/15/2015  ? Procedure: [place] STUDY;  Surgeon: Mauri Pole, MD;  Location: [facility];  Service: Endoscopy;  Laterality: N/A;  ? DILATION AND CURETTAGE OF UTERUS    ? ESOPHAGEAL MANOMETRY N/A 12/15/2015  ? Procedure: ESOPHAGEAL MANOMETRY (EM);  Surgeon: Mauri Pole, MD;  Location: [facility];  Service: Endoscopy;  Laterality: N/A;  ? ? ?There were no vitals filed for this visit. ? ? Subjective  Assessment - 08/16/21 1022   ? ? Subjective Patient reports her back is doing well.  Does stretches in morning.  Trying to walking 5-10 min, going out more.   ? Pertinent History HTN, history CVA/TIA, OSA, HTN   ? Diagnostic tests 04/27/21 - MRI - IMPRESSION:  Left facet arthropathy at L4-L5 with some marrow edema. No  significant stenosis at any level.   ? Patient Stated Goals move around with less pain, be more flexible   ? Currently in Pain? No/denies   ? Pain Location Back   ? Pain Descriptors / Indicators Sore   ? ?  ?  ? ?  ? ? ? ? ? ? ? ? ? ? ? ? ? ? ? ? ? ? ? ? [facility] Adult PT Treatment/Exercise - 08/16/21 0001   ? ?  ? Lumbar Exercises: Stretches  ? Single Knee to Chest Stretch Right;Left;2 reps;20 seconds   ? Lower Trunk Rotation 5 reps;10 seconds   ?  ? Lumbar Exercises: Aerobic  ? Nustep L6 x 6 min   ?  ? Lumbar Exercises: Supine  ? Pelvic Tilt 10 reps   ? Bent Knee Raise 10 reps   ? Bridge 10 reps   ?  ? Lumbar Exercises: Sidelying  ? Other Sidelying Lumbar Exercises open books x 10 bil   ?  ?  Lumbar Exercises: Quadruped  ? Madcat/Old Horse 10 reps   ? Madcat/Old Horse Limitations demo and cues   ? Single Arm Raise Right;Left;10 reps   ? Straight Leg Raise 10 reps   ? Other Quadruped Lumbar Exercises child pose stretch following leg extensions x 3   ?  ? Manual Therapy  ? Manual Therapy Soft tissue mobilization;Joint mobilization;Myofascial release   ? Manual therapy comments to LB to decrease pain and improve lumbar mobility   ? Joint Mobilization CPA and RUPA mobs L3-L5 grade 3-4   ? Soft tissue mobilization IASTM to lumbar paraspinals and gluts   ? Myofascial Release MFR to bil QL   ? ?  ?  ? ?  ? ? ? ? ? ? ? ? ? ? PT Education - 08/16/21 1104   ? ? Education Details HEP update Access Code: [email or web address]   ? Person(s) Educated Patient   ? Methods Explanation;Demonstration;Verbal cues;Handout   ? Comprehension Verbalized understanding;Returned demonstration   ? ?  ?  ? ?  ? ? ? PT Short Term Goals - 06/30/21  1750   ? ?  ? PT SHORT TERM GOAL #1  ? Title Patient to be independent with initial HEP   ? Time 3   ? Period Weeks   ? Status Achieved   ? Target Date 06/09/21   ?  ? PT SHORT TERM GOAL #2  ? Title Patient to demonstrate good posture and body mechanics to reduced stressors placed on low back musculature for reduced pain   ? Baseline noted trunk shift to R   ? Time 3   ? Period Weeks   ? Status Partially Met   less WS noted to the R  ? Target Date 06/16/21   ? ?  ?  ? ?  ? ? ? ? PT Long Term Goals - 07/27/21 1542   ? ?  ? PT LONG TERM GOAL #1  ? Title Patient to be independent with advanced HEP to improve outcomes   ? Time 8   ? Period Weeks   ? Status On-going   07/27/21- met for current  ? Target Date 09/01/21   ?  ? PT LONG TERM GOAL #2  ? Title Patient wil report 75% improvement in LBP with activities.   ? Time 4   ? Period Weeks   ? Status On-going   07/27/21- progress, 50-60%  ? Target Date 09/01/21   ?  ? PT LONG TERM GOAL #3  ? Title Patient to report ability to perform transfers from various positions with no increase in pain   ? Baseline very stiff with transitional movements.   ? Time 4   ? Period Weeks   ? Status On-going   07/27/21- reports stiffness more than pain  ? Target Date 09/01/21   ?  ? PT LONG TERM GOAL #4  ? Title Patient will demonstrate improved functional LE strength by completing 5xSTS in < 15 seconds   ? Baseline 26 seconds, slow gaurded movements   ? Time 4   ? Period Weeks   ? Status Achieved   07/27/21-  trial 1 16 seconds, trial 2 14 seconds.  Some pressure in LB.  ? Target Date 07/14/21   ?  ? PT LONG TERM GOAL #5  ? Title Pt. will demonstrate improved functional ability by improving FOTO score to 54% or better.   ? Baseline 44%   ? Time 4   ? Period Weeks   ?  Status On-going   07/27/21-53%  ? Target Date 09/01/21   ?  ? Additional Long Term Goals  ? Additional Long Term Goals Yes   ?  ? PT LONG TERM GOAL #6  ? Title Pt. will be able to lift 10lbs from floor to counter to carry grocery bags.    ? Time 5   ? Period Weeks   ? Status New   ? Target Date 09/01/21   ? ?  ?  ? ?  ? ? ? ? ? ? ? ? Plan - 08/16/21 1104   ? ? Clinical Impression Statement Pt. reports trying to increase activity level and has been doing stretches and walking most days.  Today focused on progressing core strengthening as well as more stretches, including open books, at cat cows for thoracic mobility.  She tolerated exercises well, and reported decreased tightness following manual therapy.  HEP updated with new exercises.   ? Personal Factors and Comorbidities Comorbidity 3+   ? Comorbidities history chronic LBP, HTN, history of CVA/TIA, obesity   ? PT Frequency 2x / week   ? PT Duration 8 weeks   ? PT Treatment/Interventions ADLs/Self Care Home Management;Cryotherapy;Electrical Stimulation;Moist Heat;Traction;Ultrasound;Gait training;Stair training;Functional mobility training;Therapeutic activities;Therapeutic exercise;Balance training;Neuromuscular re-education;Patient/family education;Manual techniques;Passive range of motion;Dry needling;Taping;Spinal Manipulations;Joint Manipulations   ? PT Next Visit Plan spinal mobs; progress core stab exercises;  Manual/modalities PRN   ? Consulted and Agree with Plan of Care Patient   ? ?  ?  ? ?  ? ? ?Patient will benefit from skilled therapeutic intervention in order to improve the following deficits and impairments:  Decreased endurance, Decreased mobility, Difficulty walking, Increased muscle spasms, Hypomobility, Decreased range of motion, Decreased activity tolerance, Decreased strength, Increased fascial restricitons, Impaired flexibility, Postural dysfunction, Pain ? ?Visit Diagnosis: ?Chronic midline low back pain with left-sided sciatica ? ?Muscle weakness (generalized) ? ?Abnormal posture ? ?Difficulty in walking, not elsewhere classified ? ? ? ? ?Problem List ?Patient Active Problem List  ? Diagnosis Date Noted  ? 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  ? LAD (lymphadenopathy), anterior  cervical 07/10/2021  ? Vertigo 05/17/2021  ? Low back pain 04/07/2021  ? COVID-19 02/21/2021  ? UTI (urinary tract infection) 08/01/2020  ? Muscle cramps 06/24/2020  ? Cough 06/22/2019  ? Insomnia 12/31/

## 2021-08-18 ENCOUNTER — Ambulatory Visit: Payer: Managed Care, Other (non HMO) | Admitting: Physical Therapy

## 2021-08-23 ENCOUNTER — Ambulatory Visit: Payer: Managed Care, Other (non HMO)

## 2021-08-23 ENCOUNTER — Other Ambulatory Visit: Payer: Self-pay

## 2021-08-23 DIAGNOSIS — G8929 Other chronic pain: Secondary | ICD-10-CM

## 2021-08-23 DIAGNOSIS — M5442 Lumbago with sciatica, left side: Secondary | ICD-10-CM | POA: Diagnosis not present

## 2021-08-23 DIAGNOSIS — M6281 Muscle weakness (generalized): Secondary | ICD-10-CM

## 2021-08-23 DIAGNOSIS — R262 Difficulty in walking, not elsewhere classified: Secondary | ICD-10-CM

## 2021-08-23 DIAGNOSIS — R293 Abnormal posture: Secondary | ICD-10-CM

## 2021-08-23 NOTE — Therapy (Addendum)
PHYSICAL THERAPY DISCHARGE SUMMARY  Visits from Start of Care: 8  Current functional level related to goals / functional outcomes: Reported 50-60% improvement overall   Remaining deficits: See below   Education / Equipment: HEP  Plan:   Patient goals were not met. Patient is being discharged due to attendance policy, did not return after 08/23/21, will need new order to return to therapy.     Rennie Natter, PT, DPT 11:45 AM 10/27/2021  [facility] [place], [place], [address] Phone: [phone]   Fax:  [phone]  Physical Therapy Treatment  Patient Details  Name: Margaret [name] MRN: [id number] Date of Birth: Jun 16, 1950 Referring Provider (PT): Joni Fears MD   Encounter Date: 08/23/2021   PT End of Session - 08/23/21 1619     Visit Number 8    Number of Visits 12    Date for PT Re-Evaluation 09/01/21    Authorization Type [employer] [id number]    PT Start Time 1534    PT Stop Time 1616    PT Time Calculation (min) 42 min    Activity Tolerance Patient tolerated treatment well    Behavior During Therapy [facility] for tasks assessed/performed             Past Medical History:  Diagnosis Date   Allergic state 03/25/2012   Allergy    Aortic calcification ([place]) 08/27/2014   Atherosclerosis of aorta ([place]) 04/12/2014   Back pain    Constipation 01/23/2017   GERD (gastroesophageal reflux disease)    Hiatal hernia with gastroesophageal reflux 04/27/2010   Qualifier: Diagnosis of  By: Danelle Earthly CMA, Darlene  Failed Omeprazole and Protonix    Hx of adenomatous polyp of colon 01/23/2017   Colonoscopy 01/16/2017 with 2 polyps one was adenomatous. recommeded follow up in 5 years.performed by Dr Silverio Decamp   Hyperlipidemia    Hypertension    Joint pain    Osteopenia 08/31/2012   Peripheral edema 12/13/2014   Sleep apnea    Unspecified sinusitis (chronic) 03/30/2013    Past Surgical History:   Procedure Laterality Date   24 HOUR [place] STUDY N/A 12/15/2015   Procedure: 24 HOUR PH STUDY;  Surgeon: Mauri Pole, MD;  Location: [facility];  Service: Endoscopy;  Laterality: N/A;   DILATION AND CURETTAGE OF UTERUS     ESOPHAGEAL MANOMETRY N/A 12/15/2015   Procedure: ESOPHAGEAL MANOMETRY (EM);  Surgeon: Mauri Pole, MD;  Location: [facility];  Service: Endoscopy;  Laterality: N/A;    There were no vitals filed for this visit.   Subjective Assessment - 08/23/21 1536     Subjective Pt reports her pain is getting better everyday.    Pertinent History HTN, history CVA/TIA, OSA, HTN    Diagnostic tests 04/27/21 - MRI - IMPRESSION:  Left facet arthropathy at L4-L5 with some marrow edema. No  significant stenosis at any level.    Patient Stated Goals move around with less pain, be more flexible    Currently in Pain? No/denies                               [facility] Adult PT Treatment/Exercise - 08/23/21 0001       Lumbar Exercises: Stretches   Other Lumbar Stretch Exercise seated fwd flexion and QL stretch with orange Pball 2x30"      Lumbar Exercises: Aerobic   Nustep L6 x 6 min  Lumbar Exercises: Machines for Strengthening   Other Lumbar Machine Exercise seated shoulder press 2lb x 10      Lumbar Exercises: Standing   Other Standing Lumbar Exercises farmers carries with blue weight ball 2x around clinic      Lumbar Exercises: Seated   Sit to Stand 10 reps   2 sets   Other Seated Lumbar Exercises seated DL with blue weighted ball 2x10    Other Seated Lumbar Exercises chest presses with blue weighted ball x 10      Knee/Hip Exercises: Standing   Hip Abduction Stengthening;Both;10 reps;Knee straight;2 sets    Hip Extension Stengthening;Both;10 reps;Knee straight;2 sets    Functional Squat 10 reps    Functional Squat Limitations cues for post WS                       PT Short Term Goals - 06/30/21 1750       PT SHORT TERM  GOAL #1   Title Patient to be independent with initial HEP    Time 3    Period Weeks    Status Achieved    Target Date 06/09/21      PT SHORT TERM GOAL #2   Title Patient to demonstrate good posture and body mechanics to reduced stressors placed on low back musculature for reduced pain    Baseline noted trunk shift to R    Time 3    Period Weeks    Status Partially Met   less WS noted to the R   Target Date 06/16/21               PT Long Term Goals - 07/27/21 1542       PT LONG TERM GOAL #1   Title Patient to be independent with advanced HEP to improve outcomes    Time 8    Period Weeks    Status On-going   07/27/21- met for current   Target Date 09/01/21      PT LONG TERM GOAL #2   Title Patient wil report 75% improvement in LBP with activities.    Time 4    Period Weeks    Status On-going   07/27/21- progress, 50-60%   Target Date 09/01/21      PT LONG TERM GOAL #3   Title Patient to report ability to perform transfers from various positions with no increase in pain    Baseline very stiff with transitional movements.    Time 4    Period Weeks    Status On-going   07/27/21- reports stiffness more than pain   Target Date 09/01/21      PT LONG TERM GOAL #4   Title Patient will demonstrate improved functional LE strength by completing 5xSTS in < 15 seconds    Baseline 26 seconds, slow gaurded movements    Time 4    Period Weeks    Status Achieved   07/27/21-  trial 1 16 seconds, trial 2 14 seconds.  Some pressure in LB.   Target Date 07/14/21      PT LONG TERM GOAL #5   Title Pt. will demonstrate improved functional ability by improving FOTO score to 54% or better.    Baseline 44%    Time 4    Period Weeks    Status On-going   07/27/21-53%   Target Date 09/01/21      Additional Long Term Goals   Additional Long Term Goals Yes  PT LONG TERM GOAL #6   Title Pt. will be able to lift 10lbs from floor to counter to carry grocery bags.    Time 5    Period Weeks     Status New    Target Date 09/01/21                   Plan - 08/23/21 1620     Clinical Impression Statement Pt had a good response to treatment. Min - mod instruction/cuing required for proper demonstration of exercises. Introduced standing hip strengthening and squats to help with functional strength. Pt stil demonstrates some limitations with carrying ~6lb weighted ball, reporting some pulling in the low back. She demonstrates a better STS transfer with more anterior WS on stance to reduce compression forces on the low back.    Personal Factors and Comorbidities Comorbidity 3+    Comorbidities history chronic LBP, HTN, history of CVA/TIA, obesity    PT Frequency 2x / week    PT Duration 8 weeks    PT Treatment/Interventions ADLs/Self Care Home Management;Cryotherapy;Electrical Stimulation;Moist Heat;Traction;Ultrasound;Gait training;Stair training;Functional mobility training;Therapeutic activities;Therapeutic exercise;Balance training;Neuromuscular re-education;Patient/family education;Manual techniques;Passive range of motion;Dry needling;Taping;Spinal Manipulations;Joint Manipulations    PT Next Visit Plan spinal mobs; progress core stab exercises;  Manual/modalities PRN    Consulted and Agree with Plan of Care Patient             Patient will benefit from skilled therapeutic intervention in order to improve the following deficits and impairments:  Decreased endurance, Decreased mobility, Difficulty walking, Increased muscle spasms, Hypomobility, Decreased range of motion, Decreased activity tolerance, Decreased strength, Increased fascial restricitons, Impaired flexibility, Postural dysfunction, Pain  Visit Diagnosis: Chronic midline low back pain with left-sided sciatica  Muscle weakness (generalized)  Abnormal posture  Difficulty in walking, not elsewhere classified     Problem List Patient Active Problem List   Diagnosis Date Noted   Brain aneurysm  07/10/2021   LAD (lymphadenopathy), anterior cervical 07/10/2021   Vertigo 05/17/2021   Low back pain 04/07/2021   COVID-19 02/21/2021   UTI (urinary tract infection) 08/01/2020   Muscle cramps 06/24/2020   Cough 06/22/2019   Insomnia 05/29/2019   Educated about COVID-19 virus infection 09/20/2018   Headache 04/30/2018   Other fatigue 04/03/2018   Dyspnea 04/03/2018   Obstructive sleep apnea syndrome 04/03/2018   Vitamin D deficiency 04/03/2018   Obesity 01/22/2018   Neck pain 12/16/2017   Fatty infiltration of liver 12/16/2017   Hx of colonic polyps 01/23/2017   Constipation 01/23/2017   Cervical radiculopathy    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   TIA (transient ischemic attack) 09/06/2016   Right hand weakness    Peripheral edema 12/13/2014   Aortic atherosclerosis ([place]) 04/12/2014   Skin lesion of back 03/31/2014   Joint stiffness 03/31/2014   Dysuria 02/08/2014   Essential hypertension 02/08/2014   Atypical chest pain 01/21/2014   Hyperglycemia 07/27/2013   Sinusitis 03/30/2013   Preventative health care 03/30/2013   Left shoulder pain 11/14/2012   Osteopenia 08/31/2012   Back pain 03/25/2012   Knee pain 03/25/2012   Allergic state 27/25/3664   Helicobacter pylori infection 06/08/2010   Hiatal hernia with gastroesophageal reflux 04/27/2010   Hyperlipidemia, mixed 04/07/2010    Artist Pais, PTA 08/23/2021, 5:19 PM  [facility] [address]  [place], [place], [address] Phone: [phone]   Fax:  [phone]  Name: Margaret [name] MRN: [id number] Date of Birth: 1951/01/31

## 2021-08-25 ENCOUNTER — Ambulatory Visit: Payer: Managed Care, Other (non HMO) | Admitting: Physical Therapy

## 2021-08-30 ENCOUNTER — Encounter: Payer: Managed Care, Other (non HMO) | Admitting: Physical Therapy

## 2021-08-31 ENCOUNTER — Telehealth: Payer: Self-pay | Admitting: Family Medicine

## 2021-08-31 NOTE — Telephone Encounter (Signed)
Pt would like to change pharmacy's. Any new rx will need to go to [facility].  ? ?[facility] ?[address] [place], [place] [address] ?[phone] ?

## 2021-08-31 NOTE — Telephone Encounter (Signed)
Pharmacy has been updated.

## 2021-09-01 ENCOUNTER — Ambulatory Visit: Payer: Managed Care, Other (non HMO) | Admitting: Physical Therapy

## 2021-09-06 MED ORDER — ROSUVASTATIN CALCIUM 10 MG PO TABS: 10.0000 mg | ORAL_TABLET | Freq: Every day | ORAL | 3 refills | Status: DC

## 2021-09-06 MED ORDER — CARVEDILOL 6.25 MG PO TABS: 6.2500 mg | ORAL_TABLET | Freq: Two times a day (BID) | ORAL | 1 refills | Status: DC

## 2021-09-06 NOTE — Telephone Encounter (Signed)
Pt would like medication refills carvedilol and rosuvastatin.  ? ?[facility] ?[address] [place], [place] [address] ?[phone] ?

## 2021-09-06 NOTE — Telephone Encounter (Signed)
Refills sent

## 2021-09-06 NOTE — Addendum Note (Signed)
Addended by: Lynnea Ferrier R on: 09/06/2021 01:16 PM ? ? Modules accepted: Orders ? ?

## 2021-09-07 ENCOUNTER — Ambulatory Visit (INDEPENDENT_AMBULATORY_CARE_PROVIDER_SITE_OTHER): Payer: Managed Care, Other (non HMO) | Admitting: Family Medicine

## 2021-09-07 ENCOUNTER — Encounter: Payer: Self-pay | Admitting: Family Medicine

## 2021-09-07 ENCOUNTER — Telehealth: Payer: Self-pay | Admitting: Family Medicine

## 2021-09-07 ENCOUNTER — Other Ambulatory Visit: Payer: Self-pay | Admitting: *Deleted

## 2021-09-07 VITALS — BP 134/57 | HR 50 | Temp 98.0°F | Ht 62.0 in | Wt 195.2 lb

## 2021-09-07 DIAGNOSIS — J0140 Acute pansinusitis, unspecified: Secondary | ICD-10-CM

## 2021-09-07 MED ORDER — AMOXICILLIN-POT CLAVULANATE 875-125 MG PO TABS: 1.0000 | ORAL_TABLET | Freq: Two times a day (BID) | ORAL | 0 refills | Status: AC

## 2021-09-07 MED ORDER — AMLODIPINE BESYLATE 5 MG PO TABS: 5.0000 mg | ORAL_TABLET | Freq: Every day | ORAL | 0 refills | Status: DC

## 2021-09-07 MED ORDER — FLUTICASONE PROPIONATE 50 MCG/ACT NA SUSP: 2.0000 | Freq: Every day | NASAL | 0 refills | Status: DC

## 2021-09-07 NOTE — Progress Notes (Signed)
2 weeks ?Neg covid  ?Mucinex, dayquil/nyquil ?

## 2021-09-07 NOTE — Progress Notes (Signed)
? ?Acute Office Visit ? ?Subjective:  ? ? Patient ID: Margaret [name], adult    DOB: July 20, 1950, 71 y.o.   MRN: [id number] ? ?CC: Cough, sinusitis times 2+ weeks. ? ? ?Sinusitis ?This is a new problem. The current episode started 1 to 4 weeks ago. The problem is unchanged. The maximum temperature recorded prior to her arrival was 100.4 - 100.9 F. The fever has been present for Less than 1 day. Her pain is at a severity of 4/10. Associated symptoms include congestion, coughing, headaches, shortness of breath and sinus pressure. Pertinent negatives include no chills, diaphoresis, ear pain, hoarse voice, neck pain, sneezing, sore throat or swollen glands. (Fatigue, greenish sputum, bilateral ear pressure) Treatments tried: Dayquil, Nyquil, Tylenol, vitamins. The treatment provided mild relief.  ? ? ? ?Past Medical History:  ?Diagnosis Date  ? Allergic state 03/25/2012  ? Allergy   ? Aortic calcification ([place]) 08/27/2014  ? Atherosclerosis of aorta ([place]) 04/12/2014  ? Back pain   ? Constipation 01/23/2017  ? GERD (gastroesophageal reflux disease)   ? Hiatal hernia with gastroesophageal reflux 04/27/2010  ? Qualifier: Diagnosis of  By: Danelle Earthly CMA, Darlene  Failed Omeprazole and Protonix   ? Hx of adenomatous polyp of colon 01/23/2017  ? Colonoscopy 01/16/2017 with 2 polyps one was adenomatous. recommeded follow up in 5 years.performed by Dr Silverio Decamp  ? Hyperlipidemia   ? Hypertension   ? Joint pain   ? Osteopenia 08/31/2012  ? Peripheral edema 12/13/2014  ? Sleep apnea   ? Unspecified sinusitis (chronic) 03/30/2013  ? ? ?Past Surgical History:  ?Procedure Laterality Date  ? 89 HOUR [place] STUDY N/A 12/15/2015  ? Procedure: [place] STUDY;  Surgeon: Mauri Pole, MD;  [facility];  Service: Endoscopy;  Laterality: N/A;  ? DILATION AND CURETTAGE OF UTERUS    ? ESOPHAGEAL MANOMETRY N/A 12/15/2015  ? Procedure: ESOPHAGEAL MANOMETRY (EM);  Surgeon: Mauri Pole, MD;  Location: [facility];  Service: Endoscopy;   Laterality: N/A;  ? ? ?Family History  ?Problem Relation Age of Onset  ? Stroke Mother   ? High blood pressure Mother   ? High Cholesterol Mother   ? Lung cancer Father   ? Stroke Sister   ?     18  ? Thyroid disease Sister   ? Diabetes Sister   ? Hypertension Sister   ? Hyperlipidemia Sister   ? Hypertension Sister   ? Sleep apnea Son   ? Obesity Son   ? ? ?Social History  ? ?Socioeconomic History  ? Marital status: Married  ?  Spouse name: [name]  ? Number of children: 2  ? Years of education: 58  ? Highest education level: Not on file  ?Occupational History  ? 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 Smoking status: Never  ? Smokeless tobacco: Never  ?Vaping Use  ? Vaping Use: Never used  ?Substance and Sexual Activity  ? Alcohol use: Yes  ?  Alcohol/week: 0.0 standard drinks  ?  Comment: Rarely  ? Drug use: No  ? Sexual activity: Not Currently  ?  Comment: no dietary restrictions, lives with husand, works at [employer]  ?Other Topics Concern  ? Not on file  ?Social History Narrative  ? Lives with husband  ? Caffeine- tea, 2 cups daily  ? ?Social Determinants of Health  ? ?Financial Resource Strain: Not on file  ?Food Insecurity: Not on file  ?Transportation Needs: Not on file  ?Physical  Activity: Not on file  ?Stress: Not on file  ?Social Connections: Not on file  ?Intimate Partner Violence: Not on file  ? ? ?Outpatient Medications Prior to Visit  ?Medication Sig Dispense Refill  ? amLODipine (NORVASC) 5 MG tablet Take 1 tablet (5 mg total) by mouth daily. 90 tablet 1  ? aspirin 81 MG chewable tablet Chew by mouth daily.    ? carvedilol (COREG) 6.25 MG tablet Take 1 tablet (6.25 mg total) by mouth 2 (two) times daily with a meal. 180 tablet 1  ? fish oil-omega-3 fatty acids 1000 MG capsule Take 2 g by mouth daily.    ? meclizine (ANTIVERT) 25 MG tablet Take 1 tablet (25 mg total) by mouth 3 (three) times daily as needed for dizziness. 16 tablet 0  ? Multiple Vitamin (MULTIVITAMIN WITH MINERALS) TABS  tablet Take 1 tablet by mouth daily.    ? omeprazole (PRILOSEC) 40 MG capsule Take 1 capsule (40 mg total) by mouth daily. 90 capsule 0  ? rosuvastatin (CRESTOR) 10 MG tablet Take 1 tablet (10 mg total) by mouth daily. 90 tablet 3  ? tiZANidine (ZANAFLEX) 2 MG tablet Take 0.5-2 tablets (1-4 mg total) by mouth 2 (two) times daily as needed for muscle spasms. 30 tablet 1  ? VITAMIN D PO Take 2,500 Units by mouth daily.    ? Zinc 50 MG TABS Take 1 tablet by mouth daily.    ? ?No facility-administered medications prior to visit.  ? ? ?Allergies  ?Allergen Reactions  ? Lisinopril Cough  ? Potassium-Containing Compounds Rash  ? ? ?REview of systems ?All review of systems negative except what is listed in the HPI ? ?   ?Objective:  ?  ?Physical Exam ?Vitals reviewed.  ?Constitutional:   ?   Appearance: Normal appearance. She is obese.  ?HENT:  ?   Head: Normocephalic and atraumatic.  ?   Right Ear: Tympanic membrane normal.  ?   Left Ear: Tympanic membrane normal.  ?   Nose: Congestion present.  ?   Mouth/Throat:  ?   Mouth: Mucous membranes are moist.  ?   Pharynx: Oropharynx is clear.  ?Eyes:  ?   Conjunctiva/sclera: Conjunctivae normal.  ?Cardiovascular:  ?   Rate and Rhythm: Normal rate and regular rhythm.  ?Pulmonary:  ?   Effort: Pulmonary effort is normal.  ?   Breath sounds: Normal breath sounds. No wheezing, rhonchi or rales.  ?Neurological:  ?   General: No focal deficit present.  ?   Mental Status: She is alert and oriented to person, place, and time. Mental status is at baseline.  ?Psychiatric:     ?   Mood and Affect: Mood normal.     ?   Behavior: Behavior normal.     ?   Thought Content: Thought content normal.     ?   Judgment: Judgment normal.  ? ? ?BP (!) 134/57   Pulse (!) 50   Temp 98 ?F (36.7 ?C)   Ht '5\' 2"'$  (1.575 m)   Wt 195 lb 3.2 oz (88.5 kg)   SpO2 98%   BMI 35.70 kg/m?  ?Wt Readings from Last 3 Encounters:  ?09/07/21 195 lb 3.2 oz (88.5 kg)  ?07/07/21 190 lb (86.2 kg)  ?05/17/21 192 lb 6.4  oz (87.3 kg)  ? ? ?Health Maintenance Due  ?Topic Date Due  ? Zoster Vaccines- Shingrix (1 of 2) Never done  ? COVID-19 Vaccine (4 - Booster for Pfizer series) 06/04/2020  ? ? ?  There are no preventive care reminders to display for this patient. ? ? ?Lab Results  ?Component Value Date  ? TSH 1.74 02/28/2021  ? ?Lab Results  ?Component Value Date  ? WBC 5.3 04/25/2021  ? HGB 13.0 04/25/2021  ? HCT 39.0 04/25/2021  ? MCV 87.8 04/25/2021  ? PLT 209 04/25/2021  ? ?Lab Results  ?Component Value Date  ? NA 138 04/25/2021  ? K 3.9 04/25/2021  ? CO2 27 04/25/2021  ? GLUCOSE 129 (H) 04/25/2021  ? BUN 18 04/25/2021  ? CREATININE 0.65 04/25/2021  ? BILITOT 0.5 04/25/2021  ? ALKPHOS 84 04/25/2021  ? AST 47 (H) 04/25/2021  ? ALT 54 (H) 04/25/2021  ? PROT 7.3 04/25/2021  ? ALBUMIN 3.8 04/25/2021  ? CALCIUM 9.0 04/25/2021  ? ANIONGAP 8 04/25/2021  ? GFR 91.20 02/28/2021  ? ?Lab Results  ?Component Value Date  ? CHOL 155 02/28/2021  ? ?Lab Results  ?Component Value Date  ? HDL 46.30 02/28/2021  ? ?Lab Results  ?Component Value Date  ? [place] 80 02/28/2021  ? ?Lab Results  ?Component Value Date  ? TRIG 141.0 02/28/2021  ? ?Lab Results  ?Component Value Date  ? CHOLHDL 3 02/28/2021  ? ?Lab Results  ?Component Value Date  ? HGBA1C 6.4 02/28/2021  ? ? ?   ?Assessment & Plan:  ? ?1. Acute non-recurrent pansinusitis ?Given duration of symptoms, let's try treating with antibiotics and nasal steroid spray.  ?Continue supportive measures including rest, hydration, humidifier use, steam showers, warm compresses to sinuses, warm liquids with lemon and honey, and over-the-counter cough, cold, and analgesics as needed.  ? ?- amoxicillin-clavulanate (AUGMENTIN) 875-125 MG tablet; Take 1 tablet by mouth 2 (two) times daily for 10 days.  Dispense: 20 tablet; Refill: 0 ?- fluticasone (FLONASE) 50 MCG/ACT nasal spray; Place 2 sprays into both nostrils daily.  Dispense: 1 g; Refill: 0 ? ? ?Patient aware of signs/symptoms requiring further/urgent  evaluation. ? ? ?Please contact office for follow-up if symptoms do not improve or worsen. Seek emergency care if symptoms become severe. ? ? ?Terrilyn Saver, NP ? ?

## 2021-09-07 NOTE — Telephone Encounter (Signed)
Done

## 2021-09-07 NOTE — Telephone Encounter (Signed)
Pt stated her amlodipine was supposed to be switched to [facility] as well.  ? ?amLODipine (NORVASC) 5 MG tablet  ? ?[id number] - [place], [place] - [address]  ? [address], [place] [address]  ?Phone:  [phone]  Fax:  [phone]  ?

## 2021-09-07 NOTE — Patient Instructions (Signed)
Given duration of symptoms, let's try treating with antibiotics and nasal steroid spray.  ?Continue supportive measures including rest, hydration, humidifier use, steam showers, warm compresses to sinuses, warm liquids with lemon and honey, and over-the-counter cough, cold, and analgesics as needed.  ? ?Please contact office for follow-up if symptoms do not improve or worsen. Seek emergency care if symptoms become severe. ? ?

## 2021-09-07 NOTE — Telephone Encounter (Signed)
See note below

## 2021-09-08 ENCOUNTER — Encounter: Payer: Self-pay | Admitting: Family Medicine

## 2021-09-10 ENCOUNTER — Other Ambulatory Visit: Payer: Self-pay | Admitting: Family Medicine

## 2021-09-13 ENCOUNTER — Encounter: Payer: Managed Care, Other (non HMO) | Admitting: Physical Therapy

## 2021-09-21 NOTE — Progress Notes (Deleted)
? ?Subjective:  ? ? Patient ID: Margaret [name], adult    DOB: [birth year], 71 y.o.   MRN: [id number] ? ?No chief complaint on file. ? ? ?HPI ?Patient is in today for a follow up. ? ?Past Medical History:  ?Diagnosis Date  ? Allergic state 03/25/2012  ? Allergy   ? Aortic calcification ([place]) 08/27/2014  ? Atherosclerosis of aorta ([place]) 04/12/2014  ? Back pain   ? Constipation 01/23/2017  ? GERD (gastroesophageal reflux disease)   ? Hiatal hernia with gastroesophageal reflux 04/27/2010  ? Qualifier: Diagnosis of  By: Danelle Earthly CMA, Darlene  Failed Omeprazole and Protonix   ? Hx of adenomatous polyp of colon 01/23/2017  ? Colonoscopy 01/16/2017 with 2 polyps one was adenomatous. recommeded follow up in 5 years.performed by Dr Silverio Decamp  ? Hyperlipidemia   ? Hypertension   ? Joint pain   ? Osteopenia 08/31/2012  ? Peripheral edema 12/13/2014  ? Sleep apnea   ? Unspecified sinusitis (chronic) 03/30/2013  ? ? ?Past Surgical History:  ?Procedure Laterality Date  ? 68 HOUR [place] STUDY N/A 12/15/2015  ? Procedure: [place] STUDY;  Surgeon: Mauri Pole, MD;  Location: [facility];  Service: Endoscopy;  Laterality: N/A;  ? DILATION AND CURETTAGE OF UTERUS    ? ESOPHAGEAL MANOMETRY N/A 12/15/2015  ? Procedure: ESOPHAGEAL MANOMETRY (EM);  Surgeon: Mauri Pole, MD;  Location: [facility];  Service: Endoscopy;  Laterality: N/A;  ? ? ?Family History  ?Problem Relation Age of Onset  ? Stroke Mother   ? High blood pressure Mother   ? High Cholesterol Mother   ? Lung cancer Father   ? Stroke Sister   ?     59  ? Thyroid disease Sister   ? Diabetes Sister   ? Hypertension Sister   ? Hyperlipidemia Sister   ? Hypertension Sister   ? Sleep apnea Son   ? Obesity Son   ? ? ?Social History  ? ?Socioeconomic History  ? Marital status: Married  ?  Spouse name: [name]  ? Number of children: 2  ? Years of education: 61  ? Highest education level: Not on file  ?Occupational History  ? 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  Smoking status: Never  ? Smokeless tobacco: Never  ?Vaping Use  ? Vaping Use: Never used  ?Substance and Sexual Activity  ? Alcohol use: Yes  ?  Alcohol/week: 0.0 standard drinks  ?  Comment: Rarely  ? Drug use: No  ? Sexual activity: Not Currently  ?  Comment: no dietary restrictions, lives with husand, works at [employer]  ?Other Topics Concern  ? Not on file  ?Social History Narrative  ? Lives with husband  ? Caffeine- tea, 2 cups daily  ? ?Social Determinants of Health  ? ?Financial Resource Strain: Not on file  ?Food Insecurity: Not on file  ?Transportation Needs: Not on file  ?Physical Activity: Not on file  ?Stress: Not on file  ?Social Connections: Not on file  ?Intimate Partner Violence: Not on file  ? ? ?Outpatient Medications Prior to Visit  ?Medication Sig Dispense Refill  ? amLODipine (NORVASC) 5 MG tablet TAKE 1 TABLET(5 MG) BY MOUTH DAILY 90 tablet 1  ? aspirin 81 MG chewable tablet Chew by mouth daily.    ? carvedilol (COREG) 6.25 MG tablet Take 1 tablet (6.25 mg total) by mouth 2 (two) times daily with a meal. 180 tablet 1  ? fish oil-omega-3 fatty acids  1000 MG capsule Take 2 g by mouth daily.    ? fluticasone (FLONASE) 50 MCG/ACT nasal spray Place 2 sprays into both nostrils daily. 1 g 0  ? meclizine (ANTIVERT) 25 MG tablet Take 1 tablet (25 mg total) by mouth 3 (three) times daily as needed for dizziness. 16 tablet 0  ? Multiple Vitamin (MULTIVITAMIN WITH MINERALS) TABS tablet Take 1 tablet by mouth daily.    ? omeprazole (PRILOSEC) 40 MG capsule Take 1 capsule (40 mg total) by mouth daily. 90 capsule 0  ? rosuvastatin (CRESTOR) 10 MG tablet Take 1 tablet (10 mg total) by mouth daily. 90 tablet 3  ? tiZANidine (ZANAFLEX) 2 MG tablet Take 0.5-2 tablets (1-4 mg total) by mouth 2 (two) times daily as needed for muscle spasms. 30 tablet 1  ? VITAMIN D PO Take 2,500 Units by mouth daily.    ? Zinc 50 MG TABS Take 1 tablet by mouth daily.    ? ?No facility-administered medications prior to visit.   ? ? ?Allergies  ?Allergen Reactions  ? Lisinopril Cough  ? Potassium-Containing Compounds Rash  ? ? ?ROS ? ?   ?Objective:  ?  ?Physical Exam ? ?There were no vitals taken for this visit. ?Wt Readings from Last 3 Encounters:  ?09/07/21 195 lb 3.2 oz (88.5 kg)  ?07/07/21 190 lb (86.2 kg)  ?05/17/21 192 lb 6.4 oz (87.3 kg)  ? ? ?Diabetic Foot Exam - Simple   ?No data filed ?  ? ?Lab Results  ?Component Value Date  ? WBC 5.3 04/25/2021  ? HGB 13.0 04/25/2021  ? HCT 39.0 04/25/2021  ? PLT 209 04/25/2021  ? GLUCOSE 129 (H) 04/25/2021  ? CHOL 155 02/28/2021  ? TRIG 141.0 02/28/2021  ? HDL 46.30 02/28/2021  ? [place] 80 02/28/2021  ? ALT 54 (H) 04/25/2021  ? AST 47 (H) 04/25/2021  ? NA 138 04/25/2021  ? K 3.9 04/25/2021  ? CL 103 04/25/2021  ? CREATININE 0.65 04/25/2021  ? BUN 18 04/25/2021  ? CO2 27 04/25/2021  ? TSH 1.74 02/28/2021  ? HGBA1C 6.4 02/28/2021  ? MICROALBUR 3.06 (H) 05/16/2011  ? ? ?Lab Results  ?Component Value Date  ? TSH 1.74 02/28/2021  ? ?Lab Results  ?Component Value Date  ? WBC 5.3 04/25/2021  ? HGB 13.0 04/25/2021  ? HCT 39.0 04/25/2021  ? MCV 87.8 04/25/2021  ? PLT 209 04/25/2021  ? ?Lab Results  ?Component Value Date  ? NA 138 04/25/2021  ? K 3.9 04/25/2021  ? CO2 27 04/25/2021  ? GLUCOSE 129 (H) 04/25/2021  ? BUN 18 04/25/2021  ? CREATININE 0.65 04/25/2021  ? BILITOT 0.5 04/25/2021  ? ALKPHOS 84 04/25/2021  ? AST 47 (H) 04/25/2021  ? ALT 54 (H) 04/25/2021  ? PROT 7.3 04/25/2021  ? ALBUMIN 3.8 04/25/2021  ? CALCIUM 9.0 04/25/2021  ? ANIONGAP 8 04/25/2021  ? GFR 91.20 02/28/2021  ? ?Lab Results  ?Component Value Date  ? CHOL 155 02/28/2021  ? ?Lab Results  ?Component Value Date  ? HDL 46.30 02/28/2021  ? ?Lab Results  ?Component Value Date  ? [place] 80 02/28/2021  ? ?Lab Results  ?Component Value Date  ? TRIG 141.0 02/28/2021  ? ?Lab Results  ?Component Value Date  ? CHOLHDL 3 02/28/2021  ? ?Lab Results  ?Component Value Date  ? HGBA1C 6.4 02/28/2021  ? ? ?   ?Assessment & Plan:  ? ?Problem List  Items Addressed This Visit   ?None ? ? ?  I am having Margaret [name] "Margaret [name]" maintain her fish oil-omega-3 fatty acids, multivitamin with minerals, aspirin, VITAMIN D PO, Zinc, omeprazole, tiZANidine, meclizine, carvedilol, rosuvastatin, fluticasone, and amLODipine. ? ?No orders of the defined types were placed in this encounter. ? ? ?

## 2021-09-22 ENCOUNTER — Ambulatory Visit (INDEPENDENT_AMBULATORY_CARE_PROVIDER_SITE_OTHER): Payer: Medicare (Managed Care) | Admitting: Family Medicine

## 2021-09-22 ENCOUNTER — Encounter: Payer: Self-pay | Admitting: Family Medicine

## 2021-09-22 VITALS — BP 130/60 | HR 77 | Resp 20 | Ht 62.0 in | Wt 196.4 lb

## 2021-09-22 DIAGNOSIS — E559 Vitamin D deficiency, unspecified: Secondary | ICD-10-CM

## 2021-09-22 DIAGNOSIS — R739 Hyperglycemia, unspecified: Secondary | ICD-10-CM

## 2021-09-22 DIAGNOSIS — E782 Mixed hyperlipidemia: Secondary | ICD-10-CM | POA: Diagnosis not present

## 2021-09-22 DIAGNOSIS — I10 Essential (primary) hypertension: Secondary | ICD-10-CM | POA: Diagnosis not present

## 2021-09-22 DIAGNOSIS — E66812 Obesity, class 2: Secondary | ICD-10-CM

## 2021-09-22 NOTE — Patient Instructions (Addendum)
Can take a third carvedilol if BP >165/90 and pulse above 70 ? ?Hypertension, Adult ?High blood pressure (hypertension) is when the force of blood pumping through the arteries is too strong. The arteries are the blood vessels that carry blood from the heart throughout the body. Hypertension forces the heart to work harder to pump blood and may cause arteries to become narrow or stiff. Untreated or uncontrolled hypertension can lead to a heart attack, heart failure, a stroke, kidney disease, and other problems. ?A blood pressure reading consists of a higher number over a lower number. Ideally, your blood pressure should be below 120/80. The first ("top") number is called the systolic pressure. It is a measure of the pressure in your arteries as your heart beats. The second ("bottom") number is called the diastolic pressure. It is a measure of the pressure in your arteries as the heart relaxes. ?What are the causes? ?The exact cause of this condition is not known. There are some conditions that result in high blood pressure. ?What increases the risk? ?Certain factors may make you more likely to develop high blood pressure. Some of these risk factors are under your control, including: ?Smoking. ?Not getting enough exercise or physical activity. ?Being overweight. ?Having too much fat, sugar, calories, or salt (sodium) in your diet. ?Drinking too much alcohol. ?Other risk factors include: ?Having a personal history of heart disease, diabetes, high cholesterol, or kidney disease. ?Stress. ?Having a family history of high blood pressure and high cholesterol. ?Having obstructive sleep apnea. ?Age. The risk increases with age. ?What are the signs or symptoms? ?High blood pressure may not cause symptoms. Very high blood pressure (hypertensive crisis) may cause: ?Headache. ?Fast or irregular heartbeats (palpitations). ?Shortness of breath. ?Nosebleed. ?Nausea and vomiting. ?Vision changes. ?Severe chest pain, dizziness, and  seizures. ?How is this diagnosed? ?This condition is diagnosed by measuring your blood pressure while you are seated, with your arm resting on a flat surface, your legs uncrossed, and your feet flat on the floor. The cuff of the blood pressure monitor will be placed directly against the skin of your upper arm at the level of your heart. Blood pressure should be measured at least twice using the same arm. Certain conditions can cause a difference in blood pressure between your right and left arms. ?If you have a high blood pressure reading during one visit or you have normal blood pressure with other risk factors, you may be asked to: ?Return on a different day to have your blood pressure checked again. ?Monitor your blood pressure at home for 1 week or longer. ?If you are diagnosed with hypertension, you may have other blood or imaging tests to help your health care provider understand your overall risk for other conditions. ?How is this treated? ?This condition is treated by making healthy lifestyle changes, such as eating healthy foods, exercising more, and reducing your alcohol intake. You may be referred for counseling on a healthy diet and physical activity. ?Your health care provider may prescribe medicine if lifestyle changes are not enough to get your blood pressure under control and if: ?Your systolic blood pressure is above 130. ?Your diastolic blood pressure is above 80. ?Your personal target blood pressure may vary depending on your medical conditions, your age, and other factors. ?Follow these instructions at home: ?Eating and drinking ? ?Eat a diet that is high in fiber and potassium, and low in sodium, added sugar, and fat. An example of this eating plan is called the DASH diet.  DASH stands for Dietary Approaches to Stop Hypertension. To eat this way: ?Eat plenty of fresh fruits and vegetables. Try to fill one half of your plate at each meal with fruits and vegetables. ?Eat whole grains, such as  whole-wheat pasta, brown rice, or whole-grain bread. Fill about one fourth of your plate with whole grains. ?Eat or drink low-fat dairy products, such as skim milk or low-fat yogurt. ?Avoid fatty cuts of meat, processed or cured meats, and poultry with skin. Fill about one fourth of your plate with lean proteins, such as fish, chicken without skin, beans, eggs, or tofu. ?Avoid pre-made and processed foods. These tend to be higher in sodium, added sugar, and fat. ?Reduce your daily sodium intake. Many people with hypertension should eat less than 1,500 mg of sodium a day. ?Do not drink alcohol if: ?Your health care provider tells you not to drink. ?You are pregnant, may be pregnant, or are planning to become pregnant. ?If you drink alcohol: ?Limit how much you have to: ?0-1 drink a day for women. ?0-2 drinks a day for men. ?Know how much alcohol is in your drink. In the [place] one drink equals one 12 oz bottle of beer (355 mL), one 5 oz glass of wine (148 mL), or one 1? oz glass of hard liquor (44 mL). ?Lifestyle ? ?Work with your health care provider to maintain a healthy body weight or to lose weight. Ask what an ideal weight is for you. ?Get at least 30 minutes of exercise that causes your heart to beat faster (aerobic exercise) most days of the week. Activities may include walking, swimming, or biking. ?Include exercise to strengthen your muscles (resistance exercise), such as Pilates or lifting weights, as part of your weekly exercise routine. Try to do these types of exercises for 30 minutes at least 3 days a week. ?Do not use any products that contain nicotine or tobacco. These products include cigarettes, chewing tobacco, and vaping devices, such as e-cigarettes. If you need help quitting, ask your health care provider. ?Monitor your blood pressure at home as told by your health care provider. ?Keep all follow-up visits. This is important. ?Medicines ?Take over-the-counter and prescription medicines only as  told by your health care provider. Follow directions carefully. Blood pressure medicines must be taken as prescribed. ?Do not skip doses of blood pressure medicine. Doing this puts you at risk for problems and can make the medicine less effective. ?Ask your health care provider about side effects or reactions to medicines that you should watch for. ?Contact a health care provider if you: ?Think you are having a reaction to a medicine you are taking. ?Have headaches that keep coming back (recurring). ?Feel dizzy. ?Have swelling in your ankles. ?Have trouble with your vision. ?Get help right away if you: ?Develop a severe headache or confusion. ?Have unusual weakness or numbness. ?Feel faint. ?Have severe pain in your chest or abdomen. ?Vomit repeatedly. ?Have trouble breathing. ?These symptoms may be an emergency. Get help right away. Call 911. ?Do not wait to see if the symptoms will go away. ?Do not drive yourself to the hospital. ?Summary ?Hypertension is when the force of blood pumping through your arteries is too strong. If this condition is not controlled, it may put you at risk for serious complications. ?Your personal target blood pressure may vary depending on your medical conditions, your age, and other factors. For most people, a normal blood pressure is less than 120/80. ?Hypertension is treated with lifestyle changes, medicines,  or a combination of both. Lifestyle changes include losing weight, eating a healthy, low-sodium diet, exercising more, and limiting alcohol. ?This information is not intended to replace advice given to you by your health care provider. Make sure you discuss any questions you have with your health care provider. ?Document Revised: 03/22/2021 Document Reviewed: 03/22/2021 ?Elsevier Patient Education ? [place]. ? ?

## 2021-09-22 NOTE — Progress Notes (Signed)
? ?Subjective:  ? ?By signing my name below, I, Zite Okoli, attest that this documentation has been prepared under the direction and in the presence of Mosie Lukes, MD. 09/22/2021  ? ? Patient ID: Margaret [name], adult    DOB: [birth year], 71 y.o.   MRN: [id number] ? ?Chief Complaint  ?Patient presents with  ? Follow-up  ? ? ?HPI ?Patient is in today for an office visit and 1 month f/u  ? ?She came in 2 weeks earlier complaining of allergy symptoms. She was given antibiotics and is feeling much better. ? ?Her blood pressure is elevated at the beginning of this visit. 156/78. She checks it at home and readings are normally high. She adds she is not consistent with her medication and takes 6.25 mg carvedilol once daily instead of twice most days. ?BP Readings from Last 3 Encounters:  ?09/22/21 130/60  ?09/07/21 (!) 134/57  ?08/05/21 (!) 142/80  ? ?She is complaining of a headache that started earlier today. Localized to the front of her head. ? ?Past Medical History:  ?Diagnosis Date  ? Allergic state 03/25/2012  ? Allergy   ? Aortic calcification ([place]) 08/27/2014  ? Atherosclerosis of aorta ([place]) 04/12/2014  ? Back pain   ? Constipation 01/23/2017  ? GERD (gastroesophageal reflux disease)   ? Hiatal hernia with gastroesophageal reflux 04/27/2010  ? Qualifier: Diagnosis of  By: Danelle Earthly CMA, Darlene  Failed Omeprazole and Protonix   ? Hx of adenomatous polyp of colon 01/23/2017  ? Colonoscopy 01/16/2017 with 2 polyps one was adenomatous. recommeded follow up in 5 years.performed by Dr Silverio Decamp  ? Hyperlipidemia   ? Hypertension   ? Joint pain   ? Osteopenia 08/31/2012  ? Peripheral edema 12/13/2014  ? Sleep apnea   ? Unspecified sinusitis (chronic) 03/30/2013  ? ? ?Past Surgical History:  ?Procedure Laterality Date  ? 35 HOUR [place] STUDY N/A 12/15/2015  ? Procedure: [place] STUDY;  Surgeon: Mauri Pole, MD;  [facility];  Service: Endoscopy;  Laterality: N/A;  ? DILATION AND CURETTAGE OF UTERUS    ?  ESOPHAGEAL MANOMETRY N/A 12/15/2015  ? Procedure: ESOPHAGEAL MANOMETRY (EM);  Surgeon: Mauri Pole, MD;  [facility];  Service: Endoscopy;  Laterality: N/A;  ? ? ?Family History  ?Problem Relation Age of Onset  ? Stroke Mother   ? High blood pressure Mother   ? High Cholesterol Mother   ? Lung cancer Father   ? Stroke Sister   ?     68  ? Thyroid disease Sister   ? Diabetes Sister   ? Hypertension Sister   ? Hyperlipidemia Sister   ? Hypertension Sister   ? Sleep apnea Son   ? Obesity Son   ? ? ?Social History  ? ?Socioeconomic History  ? Marital status: Married  ?  Spouse name: [name]  ? Number of children: 2  ? Years of education: 49  ? Highest education level: Not on file  ?Occupational History  ? 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 Smoking status: Never  ? Smokeless tobacco: Never  ?Vaping Use  ? Vaping Use: Never used  ?Substance and Sexual Activity  ? Alcohol use: Yes  ?  Alcohol/week: 0.0 standard drinks  ?  Comment: Rarely  ? Drug use: No  ? Sexual activity: Not Currently  ?  Comment: no dietary restrictions, lives with husand, works at [employer]  ?Other Topics Concern  ? Not on file  ?  Social History Narrative  ? Lives with husband  ? Caffeine- tea, 2 cups daily  ? ?Social Determinants of Health  ? ?Financial Resource Strain: Not on file  ?Food Insecurity: Not on file  ?Transportation Needs: Not on file  ?Physical Activity: Not on file  ?Stress: Not on file  ?Social Connections: Not on file  ?Intimate Partner Violence: Not on file  ? ? ?Outpatient Medications Prior to Visit  ?Medication Sig Dispense Refill  ? amLODipine (NORVASC) 5 MG tablet TAKE 1 TABLET(5 MG) BY MOUTH DAILY 90 tablet 1  ? aspirin 81 MG chewable tablet Chew by mouth daily.    ? carvedilol (COREG) 6.25 MG tablet Take 1 tablet (6.25 mg total) by mouth 2 (two) times daily with a meal. 180 tablet 1  ? fish oil-omega-3 fatty acids 1000 MG capsule Take 2 g by mouth daily.    ? fluticasone (FLONASE) 50 MCG/ACT  nasal spray Place 2 sprays into both nostrils daily. 1 g 0  ? meclizine (ANTIVERT) 25 MG tablet Take 1 tablet (25 mg total) by mouth 3 (three) times daily as needed for dizziness. 16 tablet 0  ? Multiple Vitamin (MULTIVITAMIN WITH MINERALS) TABS tablet Take 1 tablet by mouth daily.    ? omeprazole (PRILOSEC) 40 MG capsule Take 1 capsule (40 mg total) by mouth daily. 90 capsule 0  ? rosuvastatin (CRESTOR) 10 MG tablet Take 1 tablet (10 mg total) by mouth daily. 90 tablet 3  ? tiZANidine (ZANAFLEX) 2 MG tablet Take 0.5-2 tablets (1-4 mg total) by mouth 2 (two) times daily as needed for muscle spasms. 30 tablet 1  ? VITAMIN D PO Take 2,500 Units by mouth daily.    ? Zinc 50 MG TABS Take 1 tablet by mouth daily.    ? ?No facility-administered medications prior to visit.  ? ? ?Allergies  ?Allergen Reactions  ? Lisinopril Cough  ? Potassium-Containing Compounds Rash  ? ? ?Review of Systems  ?Constitutional:  Negative for fever and malaise/fatigue.  ?HENT:  Negative for congestion.   ?Eyes:  Negative for redness.  ?Respiratory:  Negative for shortness of breath.   ?Cardiovascular:  Negative for chest pain, palpitations and leg swelling.  ?Gastrointestinal:  Negative for abdominal pain, blood in stool and nausea.  ?Genitourinary:  Negative for dysuria and frequency.  ?Musculoskeletal:  Negative for falls.  ?Skin:  Negative for rash.  ?Neurological:  Positive for headaches. Negative for dizziness and loss of consciousness.  ?Endo/Heme/Allergies:  Negative for polydipsia.  ?Psychiatric/Behavioral:  Negative for depression. The patient is not nervous/anxious.   ? ?   ?Objective:  ?  ?Physical Exam ?Constitutional:   ?   General: She is not in acute distress. ?   Appearance: She is well-developed.  ?HENT:  ?   Head: Normocephalic and atraumatic.  ?Eyes:  ?   Conjunctiva/sclera: Conjunctivae normal.  ?Neck:  ?   Thyroid: No thyromegaly.  ?Cardiovascular:  ?   Rate and Rhythm: Normal rate and regular rhythm.  ?   Heart sounds:  Normal heart sounds. No murmur heard. ?Pulmonary:  ?   Effort: Pulmonary effort is normal. No respiratory distress.  ?   Breath sounds: Normal breath sounds.  ?Abdominal:  ?   General: Bowel sounds are normal. There is no distension.  ?   Palpations: Abdomen is soft. There is no mass.  ?   Tenderness: There is no abdominal tenderness.  ?Musculoskeletal:  ?   Cervical back: Neck supple.  ?Lymphadenopathy:  ?   Cervical:  No cervical adenopathy.  ?Skin: ?   General: Skin is warm and dry.  ?Neurological:  ?   Mental Status: She is alert and oriented to person, place, and time.  ?Psychiatric:     ?   Behavior: Behavior normal.  ? ? ?BP 130/60 (BP Location: Left Arm, Patient Position: Sitting)   Pulse 77   Resp 20   Ht '5\' 2"'$  (1.575 m)   Wt 196 lb 6.4 oz (89.1 kg)   SpO2 96%   BMI 35.92 kg/m?  ?Wt Readings from Last 3 Encounters:  ?09/22/21 196 lb 6.4 oz (89.1 kg)  ?09/07/21 195 lb 3.2 oz (88.5 kg)  ?07/07/21 190 lb (86.2 kg)  ? ? ?Diabetic Foot Exam - Simple   ?No data filed ?  ? ?Lab Results  ?Component Value Date  ? WBC 5.1 09/22/2021  ? HGB 13.7 09/22/2021  ? HCT 41.2 09/22/2021  ? PLT 200.0 09/22/2021  ? GLUCOSE 122 (H) 09/22/2021  ? CHOL 180 09/22/2021  ? TRIG 172.0 (H) 09/22/2021  ? HDL 51.10 09/22/2021  ? [place] 94 09/22/2021  ? ALT 84 (H) 09/22/2021  ? AST 62 (H) 09/22/2021  ? NA 140 09/22/2021  ? K 4.0 09/22/2021  ? CL 104 09/22/2021  ? CREATININE 0.62 09/22/2021  ? BUN 13 09/22/2021  ? CO2 29 09/22/2021  ? TSH 1.68 09/22/2021  ? HGBA1C 6.4 09/22/2021  ? MICROALBUR 3.06 (H) 05/16/2011  ? ? ?Lab Results  ?Component Value Date  ? TSH 1.68 09/22/2021  ? ?Lab Results  ?Component Value Date  ? WBC 5.1 09/22/2021  ? HGB 13.7 09/22/2021  ? HCT 41.2 09/22/2021  ? MCV 89.3 09/22/2021  ? PLT 200.0 09/22/2021  ? ?Lab Results  ?Component Value Date  ? NA 140 09/22/2021  ? K 4.0 09/22/2021  ? CO2 29 09/22/2021  ? GLUCOSE 122 (H) 09/22/2021  ? BUN 13 09/22/2021  ? CREATININE 0.62 09/22/2021  ? BILITOT 0.4 09/22/2021  ?  ALKPHOS 87 09/22/2021  ? AST 62 (H) 09/22/2021  ? ALT 84 (H) 09/22/2021  ? PROT 6.9 09/22/2021  ? ALBUMIN 4.3 09/22/2021  ? CALCIUM 9.5 09/22/2021  ? ANIONGAP 8 04/25/2021  ? GFR 91.54 09/22/2021  ? ?Lab Results  ?Com

## 2021-09-23 LAB — COMPREHENSIVE METABOLIC PANEL
ALT: 84 U/L — ABNORMAL HIGH (ref 0–35)
AST: 62 U/L — ABNORMAL HIGH (ref 0–37)
Albumin: 4.3 g/dL (ref 3.5–5.2)
Alkaline Phosphatase: 87 U/L (ref 39–117)
BUN: 13 mg/dL (ref 6–23)
CO2: 29 mEq/L (ref 19–32)
Calcium: 9.5 mg/dL (ref 8.4–10.5)
Chloride: 104 mEq/L (ref 96–112)
Creatinine, Ser: 0.62 mg/dL (ref 0.40–1.20)
GFR: 91.54 mL/min (ref >60.00)
Glucose, Bld: 122 mg/dL — ABNORMAL HIGH (ref 70–99)
Potassium: 4 mEq/L (ref 3.5–5.1)
Sodium: 140 mEq/L (ref 135–145)
Total Bilirubin: 0.4 mg/dL (ref 0.2–1.2)
Total Protein: 6.9 g/dL (ref 6.0–8.3)

## 2021-09-23 LAB — CBC
HCT: 41.2 % (ref 36.0–46.0)
Hemoglobin: 13.7 g/dL (ref 12.0–15.0)
MCHC: 33.4 g/dL (ref 30.0–36.0)
MCV: 89.3 fl (ref 78.0–100.0)
Platelets: 200 10*3/uL (ref 150.0–400.0)
RBC: 4.61 Mil/uL (ref 3.87–5.11)
RDW: 14.4 % (ref 11.5–15.5)
WBC: 5.1 10*3/uL (ref 4.0–10.5)

## 2021-09-23 LAB — LIPID PANEL
Cholesterol: 180 mg/dL (ref 0–200)
HDL: 51.1 mg/dL (ref >39.00)
LDL Cholesterol: 94 mg/dL (ref 0–99)
NonHDL: 128.6
Total CHOL/HDL Ratio: 4
Triglycerides: 172 mg/dL — ABNORMAL HIGH (ref 0.0–149.0)
VLDL: 34.4 mg/dL (ref 0.0–40.0)

## 2021-09-23 LAB — HEMOGLOBIN A1C: Hgb A1c MFr Bld: 6.4 % (ref 4.6–6.5)

## 2021-09-23 LAB — TSH: TSH: 1.68 u[IU]/mL (ref 0.35–5.50)

## 2021-09-23 LAB — VITAMIN D 25 HYDROXY (VIT D DEFICIENCY, FRACTURES): VITD: 51.21 ng/mL (ref 30.00–100.00)

## 2021-09-25 NOTE — Assessment & Plan Note (Signed)
Encouraged DASH or MIND diet, decrease po intake and increase exercise as tolerated. Needs 7-8 hours of sleep nightly. Avoid trans fats, eat small, frequent meals every 4-5 hours with lean proteins, complex carbs and healthy fats. Minimize simple carbs, high fat foods and processed foods 

## 2021-09-25 NOTE — Assessment & Plan Note (Signed)
Tolerating statin, encouraged heart healthy diet, avoid trans fats, minimize simple carbs and saturated fats. Increase exercise as tolerated 

## 2021-09-25 NOTE — Assessment & Plan Note (Signed)
hgba1c acceptable, minimize simple carbs. Increase exercise as tolerated.  

## 2021-09-25 NOTE — Assessment & Plan Note (Signed)
Well controlled, no changes to meds. Encouraged heart healthy diet such as the DASH diet and exercise as tolerated.  °

## 2021-09-25 NOTE — Assessment & Plan Note (Signed)
Supplement and monitor 

## 2021-11-05 ENCOUNTER — Other Ambulatory Visit: Payer: Self-pay | Admitting: Family Medicine

## 2021-11-09 NOTE — Progress Notes (Deleted)
Subjective:    Patient ID: Margaret [name], adult    DOB: 07/31/1950, 71 y.o.   MRN: [id number]  No chief complaint on file.   HPI Patient is in today for a follow up.  Past Medical History:  Diagnosis Date   Allergic state 03/25/2012   Allergy    Aortic calcification ([place]) 08/27/2014   Atherosclerosis of aorta ([place]) 04/12/2014   Back pain    Constipation 01/23/2017   GERD (gastroesophageal reflux disease)    Hiatal hernia with gastroesophageal reflux 04/27/2010   Qualifier: Diagnosis of  By: Danelle Earthly CMA, Darlene  Failed Omeprazole and Protonix    Hx of adenomatous polyp of colon 01/23/2017   Colonoscopy 01/16/2017 with 2 polyps one was adenomatous. recommeded follow up in 5 years.performed by Dr Silverio Decamp   Hyperlipidemia    Hypertension    Joint pain    Osteopenia 08/31/2012   Peripheral edema 12/13/2014   Sleep apnea    Unspecified sinusitis (chronic) 03/30/2013    Past Surgical History:  Procedure Laterality Date   24 HOUR [place] STUDY N/A 12/15/2015   Procedure: 24 HOUR PH STUDY;  Surgeon: Mauri Pole, MD;  Location: [facility];  Service: Endoscopy;  Laterality: N/A;   DILATION AND CURETTAGE OF UTERUS     ESOPHAGEAL MANOMETRY N/A 12/15/2015   Procedure: ESOPHAGEAL MANOMETRY (EM);  Surgeon: Mauri Pole, MD;  Location: [facility];  Service: Endoscopy;  Laterality: N/A;    Family History  Problem Relation Age of Onset   Stroke Mother    High blood pressure Mother    High Cholesterol Mother    Lung cancer Father    Stroke Sister        49   Thyroid disease Sister    Diabetes Sister    Hypertension Sister    Hyperlipidemia Sister    Hypertension Sister    Sleep apnea Son    Obesity Son     Social History   Socioeconomic History   Marital status: Married    Spouse name: [name]   Number of children: 2   Years of education: 16   Highest education level: Not on file  Occupational History   Occupation: Optometrist    Comment: retired  Tobacco Use    Smoking status: Never   Smokeless tobacco: Never  Vaping Use   Vaping Use: Never used  Substance and Sexual Activity   Alcohol use: Yes    Alcohol/week: 0.0 standard drinks of alcohol    Comment: Rarely   Drug use: No   Sexual activity: Not Currently    Comment: no dietary restrictions, lives with husand, works at [employer]  Other Topics Concern   Not on file  Social History Narrative   Lives with husband   Caffeine- tea, 2 cups daily   Social Determinants of Health   Financial Resource Strain: Not on file  Food Insecurity: Not on file  Transportation Needs: Not on file  Physical Activity: Not on file  Stress: Not on file  Social Connections: Not on file  Intimate Partner Violence: Not on file    Outpatient Medications Prior to Visit  Medication Sig Dispense Refill   amLODipine (NORVASC) 5 MG tablet TAKE 1 TABLET(5 MG) BY MOUTH DAILY 90 tablet 1   aspirin 81 MG chewable tablet Chew by mouth daily.     carvedilol (COREG) 6.25 MG tablet TAKE 1 TABLET BY MOUTH 2 TIMES DAILY WITH A MEAL. 60 tablet 5   fish oil-omega-3 fatty acids 1000 MG  capsule Take 2 g by mouth daily.     fluticasone (FLONASE) 50 MCG/ACT nasal spray Place 2 sprays into both nostrils daily. 1 g 0   meclizine (ANTIVERT) 25 MG tablet Take 1 tablet (25 mg total) by mouth 3 (three) times daily as needed for dizziness. 16 tablet 0   Multiple Vitamin (MULTIVITAMIN WITH MINERALS) TABS tablet Take 1 tablet by mouth daily.     omeprazole (PRILOSEC) 40 MG capsule Take 1 capsule (40 mg total) by mouth daily. 90 capsule 0   rosuvastatin (CRESTOR) 10 MG tablet Take 1 tablet (10 mg total) by mouth daily. 90 tablet 3   tiZANidine (ZANAFLEX) 2 MG tablet Take 0.5-2 tablets (1-4 mg total) by mouth 2 (two) times daily as needed for muscle spasms. 30 tablet 1   VITAMIN D PO Take 2,500 Units by mouth daily.     Zinc 50 MG TABS Take 1 tablet by mouth daily.     No facility-administered medications prior to visit.    Allergies   Allergen Reactions   Lisinopril Cough   Potassium-Containing Compounds Rash    ROS     Objective:    Physical Exam  There were no vitals taken for this visit. Wt Readings from Last 3 Encounters:  09/22/21 196 lb 6.4 oz (89.1 kg)  09/07/21 195 lb 3.2 oz (88.5 kg)  07/07/21 190 lb (86.2 kg)    Diabetic Foot Exam - Simple   No data filed    Lab Results  Component Value Date   WBC 5.1 09/22/2021   HGB 13.7 09/22/2021   HCT 41.2 09/22/2021   PLT 200.0 09/22/2021   GLUCOSE 122 (H) 09/22/2021   CHOL 180 09/22/2021   TRIG 172.0 (H) 09/22/2021   HDL 51.10 09/22/2021   LDLCALC 94 09/22/2021   ALT 84 (H) 09/22/2021   AST 62 (H) 09/22/2021   NA 140 09/22/2021   K 4.0 09/22/2021   CL 104 09/22/2021   CREATININE 0.62 09/22/2021   BUN 13 09/22/2021   CO2 29 09/22/2021   TSH 1.68 09/22/2021   HGBA1C 6.4 09/22/2021   MICROALBUR 3.06 (H) 05/16/2011    Lab Results  Component Value Date   TSH 1.68 09/22/2021   Lab Results  Component Value Date   WBC 5.1 09/22/2021   HGB 13.7 09/22/2021   HCT 41.2 09/22/2021   MCV 89.3 09/22/2021   PLT 200.0 09/22/2021   Lab Results  Component Value Date   NA 140 09/22/2021   K 4.0 09/22/2021   CO2 29 09/22/2021   GLUCOSE 122 (H) 09/22/2021   BUN 13 09/22/2021   CREATININE 0.62 09/22/2021   BILITOT 0.4 09/22/2021   ALKPHOS 87 09/22/2021   AST 62 (H) 09/22/2021   ALT 84 (H) 09/22/2021   PROT 6.9 09/22/2021   ALBUMIN 4.3 09/22/2021   CALCIUM 9.5 09/22/2021   ANIONGAP 8 04/25/2021   GFR 91.54 09/22/2021   Lab Results  Component Value Date   CHOL 180 09/22/2021   Lab Results  Component Value Date   HDL 51.10 09/22/2021   Lab Results  Component Value Date   LDLCALC 94 09/22/2021   Lab Results  Component Value Date   TRIG 172.0 (H) 09/22/2021   Lab Results  Component Value Date   CHOLHDL 4 09/22/2021   Lab Results  Component Value Date   HGBA1C 6.4 09/22/2021       Assessment & Plan:     Problem List  Items Addressed This Visit   None  I am having Margaret [name] "[name]" maintain her fish oil-omega-3 fatty acids, multivitamin with minerals, aspirin, VITAMIN D PO, Zinc, omeprazole, tiZANidine, meclizine, rosuvastatin, fluticasone, amLODipine, and carvedilol.  No orders of the defined types were placed in this encounter.

## 2021-11-10 ENCOUNTER — Ambulatory Visit: Payer: Medicare (Managed Care) | Admitting: Family Medicine

## 2021-11-10 DIAGNOSIS — R7989 Other specified abnormal findings of blood chemistry: Secondary | ICD-10-CM

## 2021-12-30 DIAGNOSIS — G4733 Obstructive sleep apnea (adult) (pediatric): Secondary | ICD-10-CM | POA: Diagnosis not present

## 2022-01-12 ENCOUNTER — Ambulatory Visit (INDEPENDENT_AMBULATORY_CARE_PROVIDER_SITE_OTHER): Payer: Medicare (Managed Care) | Admitting: Family Medicine

## 2022-01-12 VITALS — BP 140/64 | HR 62 | Temp 98.0°F | Resp 16 | Ht 62.0 in | Wt 198.2 lb

## 2022-01-12 DIAGNOSIS — E782 Mixed hyperlipidemia: Secondary | ICD-10-CM

## 2022-01-12 DIAGNOSIS — E559 Vitamin D deficiency, unspecified: Secondary | ICD-10-CM | POA: Diagnosis not present

## 2022-01-12 DIAGNOSIS — M545 Low back pain, unspecified: Secondary | ICD-10-CM

## 2022-01-12 DIAGNOSIS — I10 Essential (primary) hypertension: Secondary | ICD-10-CM

## 2022-01-12 DIAGNOSIS — R739 Hyperglycemia, unspecified: Secondary | ICD-10-CM | POA: Diagnosis not present

## 2022-01-12 DIAGNOSIS — R109 Unspecified abdominal pain: Secondary | ICD-10-CM | POA: Diagnosis not present

## 2022-01-12 MED ORDER — LOSARTAN POTASSIUM 25 MG PO TABS: 25.0000 mg | ORAL_TABLET | Freq: Every day | ORAL | 2 refills | Status: DC

## 2022-01-12 NOTE — Patient Instructions (Addendum)
Shingrix is the new shingles shot, 2 shots over 2-6 months, confirm coverage with insurance and document, then can return here for shots with nurse appt or at pharmacy   RSV immunization in the fall at pharmacy  Flu shot in September or October  [place] shot late Sept or early October  Managing Your Hypertension Hypertension, also called high blood pressure, is when the force of the blood pressing against the walls of the arteries is too strong. Arteries are blood vessels that carry blood from your heart throughout your body. Hypertension forces the heart to work harder to pump blood and may cause the arteries to become narrow or stiff. Understanding blood pressure readings A blood pressure reading includes a higher number over a lower number: The first, or top, number is called the systolic pressure. It is a measure of the pressure in your arteries as your heart beats. The second, or bottom number, is called the diastolic pressure. It is a measure of the pressure in your arteries as the heart relaxes. For most people, a normal blood pressure is below 120/80. Your personal target blood pressure may vary depending on your medical conditions, your age, and other factors. Blood pressure is classified into four stages. Based on your blood pressure reading, your health care provider may use the following stages to determine what type of treatment you need, if any. Systolic pressure and diastolic pressure are measured in a unit called millimeters of mercury (mmHg). Normal Systolic pressure: below 202. Diastolic pressure: below 80. Elevated Systolic pressure: 542-706. Diastolic pressure: below 80. Hypertension stage 1 Systolic pressure: 237-628. Diastolic pressure: 31-51. Hypertension stage 2 Systolic pressure: 761 or above. Diastolic pressure: 90 or above. How can this condition affect me? Managing your hypertension is very important. Over time, hypertension can damage the arteries and  decrease blood flow to parts of the body, including the brain, heart, and kidneys. Having untreated or uncontrolled hypertension can lead to: A heart attack. A stroke. A weakened blood vessel (aneurysm). Heart failure. Kidney damage. Eye damage. Memory and concentration problems. Vascular dementia. What actions can I take to manage this condition? Hypertension can be managed by making lifestyle changes and possibly by taking medicines. Your health care provider will help you make a plan to bring your blood pressure within a normal range. You may be referred for counseling on a healthy diet and physical activity. Nutrition  Eat a diet that is high in fiber and potassium, and low in salt (sodium), added sugar, and fat. An example eating plan is called the DASH diet. DASH stands for Dietary Approaches to Stop Hypertension. To eat this way: Eat plenty of fresh fruits and vegetables. Try to fill one-half of your plate at each meal with fruits and vegetables. Eat whole grains, such as whole-wheat pasta, brown rice, or whole-grain bread. Fill about one-fourth of your plate with whole grains. Eat low-fat dairy products. Avoid fatty cuts of meat, processed or cured meats, and poultry with skin. Fill about one-fourth of your plate with lean proteins such as fish, chicken without skin, beans, eggs, and tofu. Avoid pre-made and processed foods. These tend to be higher in sodium, added sugar, and fat. Reduce your daily sodium intake. Many people with hypertension should eat less than 1,500 mg of sodium a day. Lifestyle  Work with your health care provider to maintain a healthy body weight or to lose weight. Ask what an ideal weight is for you. Get at least 30 minutes of exercise that causes  your heart to beat faster (aerobic exercise) most days of the week. Activities may include walking, swimming, or biking. Include exercise to strengthen your muscles (resistance exercise), such as weight lifting, as  part of your weekly exercise routine. Try to do these types of exercises for 30 minutes at least 3 days a week. Do not use any products that contain nicotine or tobacco. These products include cigarettes, chewing tobacco, and vaping devices, such as e-cigarettes. If you need help quitting, ask your health care provider. Control any long-term (chronic) conditions you have, such as high cholesterol or diabetes. Identify your sources of stress and find ways to manage stress. This may include meditation, deep breathing, or making time for fun activities. Alcohol use Do not drink alcohol if: Your health care provider tells you not to drink. You are pregnant, may be pregnant, or are planning to become pregnant. If you drink alcohol: Limit how much you have to: 0-1 drink a day for women. 0-2 drinks a day for men. Know how much alcohol is in your drink. In the [place] one drink equals one 12 oz bottle of beer (355 mL), one 5 oz glass of wine (148 mL), or one 1 oz glass of hard liquor (44 mL). Medicines Your health care provider may prescribe medicine if lifestyle changes are not enough to get your blood pressure under control and if: Your systolic blood pressure is 130 or higher. Your diastolic blood pressure is 80 or higher. Take medicines only as told by your health care provider. Follow the directions carefully. Blood pressure medicines must be taken as told by your health care provider. The medicine does not work as well when you skip doses. Skipping doses also puts you at risk for problems. Monitoring Before you monitor your blood pressure: Do not smoke, drink caffeinated beverages, or exercise within 30 minutes before taking a measurement. Use the bathroom and empty your bladder (urinate). Sit quietly for at least 5 minutes before taking measurements. Monitor your blood pressure at home as told by your health care provider. To do this: Sit with your back straight and supported. Place your feet  flat on the floor. Do not cross your legs. Support your arm on a flat surface, such as a table. Make sure your upper arm is at heart level. Each time you measure, take two or three readings one minute apart and record the results. You may also need to have your blood pressure checked regularly by your health care provider. General information Talk with your health care provider about your diet, exercise habits, and other lifestyle factors that may be contributing to hypertension. Review all the medicines you take with your health care provider because there may be side effects or interactions. Keep all follow-up visits. Your health care provider can help you create and adjust your plan for managing your high blood pressure. Where to find more information National Heart, Lung, and Blood Institute: https://wilson-eaton.com/ American Heart Association: www.heart.org Contact a health care provider if: You think you are having a reaction to medicines you have taken. You have repeated (recurrent) headaches. You feel dizzy. You have swelling in your ankles. You have trouble with your vision. Get help right away if: You develop a severe headache or confusion. You have unusual weakness or numbness, or you feel faint. You have severe pain in your chest or abdomen. You vomit repeatedly. You have trouble breathing. These symptoms may be an emergency. Get help right away. Call 911. Do not wait to see  if the symptoms will go away. Do not drive yourself to the hospital. Summary Hypertension is when the force of blood pumping through your arteries is too strong. If this condition is not controlled, it may put you at risk for serious complications. Your personal target blood pressure may vary depending on your medical conditions, your age, and other factors. For most people, a normal blood pressure is less than 120/80. Hypertension is managed by lifestyle changes, medicines, or both. Lifestyle changes to help  manage hypertension include losing weight, eating a healthy, low-sodium diet, exercising more, stopping smoking, and limiting alcohol. This information is not intended to replace advice given to you by your health care provider. Make sure you discuss any questions you have with your health care provider. Document Revised: 01/27/2021 Document Reviewed: 01/27/2021 Elsevier Patient Education  [place].

## 2022-01-12 NOTE — Progress Notes (Signed)
Subjective:   By signing my name below, I, Kellie Simmering, attest that this documentation has been prepared under the direction and in the presence of Mosie Lukes, MD 01/12/2022.   Patient ID: Margaret [name], adult    DOB: [birth year], 71 y.o.   MRN: [id number]  Chief Complaint  Patient presents with   Follow-up    Here for 4 month follow up   HPI Patient is in today for an office visit.  Handicap parking: She is requesting a handicap parking pass.   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 Nani Ravens. She complains of abdominal and back pain. She states that she finds it difficult and painful when standing up. She says that walking helps to reduce her pain. She says that she has been taking Tylenol to help manage the pain. She states that the pain does not radiate to her legs. She is comfortable with receiving a sports medicine referral.   Weight: She states that she would like to lose weight. Her weight has been relatively the same across her past 3 visits.  Wt Readings from Last 3 Encounters:  01/12/22 198 lb 3.2 oz (89.9 kg)  09/22/21 196 lb 6.4 oz (89.1 kg)  09/07/21 195 lb 3.2 oz (88.5 kg)   Bowel movements: She reports that the color of her stool has been fluctuating.   Colonoscopy: Last completed on 03/25/2020.  - One 5 mm polyp in the descending colon, removed with a cold snare. Resected and retrieved. - Two 1 to 2 mm polyps in the rectum, removed with a cold biopsy forceps. Resected and retrieved. Repeat in 2026.   Vitamin D: She is currently taking Vitamin D.   Immunizations: She is due for Shingles immunizations. She has been informed about receiving COVID-19, Flu and RSV immunizations in the fall.   Blood pressure: Her blood pressure is slightly elevated today. She is currently taking Amlodipine 5 mg and Carvedilol 6.25 mg to help manage her blood pressure.  BP Readings from Last 3 Encounters:  01/12/22 (!) 140/64   09/22/21 130/60  09/07/21 (!) 134/57   Pulse Readings from Last 3 Encounters:  01/12/22 62  09/22/21 77  09/07/21 (!) 50    Past Medical History:  Diagnosis Date   Allergic state 03/25/2012   Allergy    Aortic calcification ([place]) 08/27/2014   Atherosclerosis of aorta ([place]) 04/12/2014   Back pain    Constipation 01/23/2017   GERD (gastroesophageal reflux disease)    Hiatal hernia with gastroesophageal reflux 04/27/2010   Qualifier: Diagnosis of  By: Danelle Earthly CMA, Darlene  Failed Omeprazole and Protonix    Hx of adenomatous polyp of colon 01/23/2017   Colonoscopy 01/16/2017 with 2 polyps one was adenomatous. recommeded follow up in 5 years.performed by Dr Silverio Decamp   Hyperlipidemia    Hypertension    Joint pain    Osteopenia 08/31/2012   Peripheral edema 12/13/2014   Sleep apnea    Unspecified sinusitis (chronic) 03/30/2013   Past Surgical History:  Procedure Laterality Date   24 HOUR [place] STUDY N/A 12/15/2015   Procedure: 24 HOUR PH STUDY;  Surgeon: Mauri Pole, MD;  Location: [facility];  Service: Endoscopy;  Laterality: N/A;   DILATION AND CURETTAGE OF UTERUS     ESOPHAGEAL MANOMETRY N/A 12/15/2015   Procedure: ESOPHAGEAL MANOMETRY (EM);  Surgeon: Mauri Pole, MD;  Location: [facility];  Service: Endoscopy;  Laterality: N/A;   Family History  Problem Relation  Age of Onset   Stroke Mother    High blood pressure Mother    High Cholesterol Mother    Lung cancer Father    Stroke Sister        37   Thyroid disease Sister    Diabetes Sister    Hypertension Sister    Hyperlipidemia Sister    Hypertension Sister    Sleep apnea Son    Obesity Son    Social History   Socioeconomic History   Marital status: Married    Spouse name: [name]   Number of children: 2   Years of education: 16   Highest education level: Not on file  Occupational History   Occupation: Optometrist    Comment: retired  Tobacco Use   Smoking status: Never   Smokeless tobacco: Never   Vaping Use   Vaping Use: Never used  Substance and Sexual Activity   Alcohol use: Yes    Alcohol/week: 0.0 standard drinks of alcohol    Comment: Rarely   Drug use: No   Sexual activity: Not Currently    Comment: no dietary restrictions, lives with husand, works at [employer]  Other Topics Concern   Not on file  Social History Narrative   Lives with husband   Caffeine- tea, 2 cups daily   Social Determinants of Health   Financial Resource Strain: Not on file  Food Insecurity: Not on file  Transportation Needs: Not on file  Physical Activity: Not on file  Stress: Not on file  Social Connections: Not on file  Intimate Partner Violence: Not on file   Outpatient Medications Prior to Visit  Medication Sig Dispense Refill   amLODipine (NORVASC) 5 MG tablet TAKE 1 TABLET(5 MG) BY MOUTH DAILY 90 tablet 1   aspirin 81 MG chewable tablet Chew by mouth daily.     carvedilol (COREG) 6.25 MG tablet TAKE 1 TABLET BY MOUTH 2 TIMES DAILY WITH A MEAL. 60 tablet 5   fish oil-omega-3 fatty acids 1000 MG capsule Take 2 g by mouth daily.     fluticasone (FLONASE) 50 MCG/ACT nasal spray Place 2 sprays into both nostrils daily. 1 g 0   meclizine (ANTIVERT) 25 MG tablet Take 1 tablet (25 mg total) by mouth 3 (three) times daily as needed for dizziness. 16 tablet 0   Multiple Vitamin (MULTIVITAMIN WITH MINERALS) TABS tablet Take 1 tablet by mouth daily.     omeprazole (PRILOSEC) 40 MG capsule Take 1 capsule (40 mg total) by mouth daily. 90 capsule 0   rosuvastatin (CRESTOR) 10 MG tablet Take 1 tablet (10 mg total) by mouth daily. 90 tablet 3   tiZANidine (ZANAFLEX) 2 MG tablet Take 0.5-2 tablets (1-4 mg total) by mouth 2 (two) times daily as needed for muscle spasms. 30 tablet 1   VITAMIN D PO Take 2,500 Units by mouth daily.     Zinc 50 MG TABS Take 1 tablet by mouth daily.     No facility-administered medications prior to visit.   Allergies  Allergen Reactions   Lisinopril Cough    Potassium-Containing Compounds Rash   ROS     Objective:    Physical Exam Constitutional:      General: She is not in acute distress.    Appearance: Normal appearance. She is not ill-appearing.  HENT:     Head: Normocephalic and atraumatic.     Right Ear: External ear normal.     Left Ear: External ear normal.     Mouth/Throat:  Mouth: Mucous membranes are moist.     Pharynx: Oropharynx is clear.  Eyes:     Extraocular Movements: Extraocular movements intact.     Pupils: Pupils are equal, round, and reactive to light.  Neck:     Vascular: No carotid bruit.  Cardiovascular:     Rate and Rhythm: Normal rate and regular rhythm.     Pulses: Normal pulses.     Heart sounds: Normal heart sounds. No murmur heard.    No gallop.  Pulmonary:     Effort: Pulmonary effort is normal. No respiratory distress.     Breath sounds: Normal breath sounds. No wheezing or rales.  Abdominal:     General: Bowel sounds are normal.  Lymphadenopathy:     Cervical: No cervical adenopathy.  Skin:    General: Skin is warm and dry.  Neurological:     Mental Status: She is alert and oriented to person, place, and time.  Psychiatric:        Mood and Affect: Mood normal.        Behavior: Behavior normal.        Judgment: Judgment normal.    BP (!) 140/64 (BP Location: Right Arm, Patient Position: Sitting, Cuff Size: Normal)   Pulse 62   Temp 98 F (36.7 C) (Oral)   Resp 16   Ht '5\' 2"'$  (1.575 m)   Wt 198 lb 3.2 oz (89.9 kg)   SpO2 96%   BMI 36.25 kg/m  Wt Readings from Last 3 Encounters:  01/12/22 198 lb 3.2 oz (89.9 kg)  09/22/21 196 lb 6.4 oz (89.1 kg)  09/07/21 195 lb 3.2 oz (88.5 kg)   Diabetic Foot Exam - Simple   No data filed    Lab Results  Component Value Date   WBC 5.5 01/12/2022   HGB 13.7 01/12/2022   HCT 41.8 01/12/2022   PLT 207.0 01/12/2022   GLUCOSE 96 01/12/2022   CHOL 188 01/12/2022   TRIG 151.0 (H) 01/12/2022   HDL 57.40 01/12/2022   LDLCALC 100 (H)  01/12/2022   ALT 51 (H) 01/12/2022   AST 39 (H) 01/12/2022   NA 139 01/12/2022   K 4.2 01/12/2022   CL 102 01/12/2022   CREATININE 0.68 01/12/2022   BUN 10 01/12/2022   CO2 32 01/12/2022   TSH 1.24 01/12/2022   HGBA1C 6.7 (H) 01/12/2022   MICROALBUR 3.06 (H) 05/16/2011   Lab Results  Component Value Date   TSH 1.24 01/12/2022   Lab Results  Component Value Date   WBC 5.5 01/12/2022   HGB 13.7 01/12/2022   HCT 41.8 01/12/2022   MCV 90.0 01/12/2022   PLT 207.0 01/12/2022   Lab Results  Component Value Date   NA 139 01/12/2022   K 4.2 01/12/2022   CO2 32 01/12/2022   GLUCOSE 96 01/12/2022   BUN 10 01/12/2022   CREATININE 0.68 01/12/2022   BILITOT 0.5 01/12/2022   ALKPHOS 90 01/12/2022   AST 39 (H) 01/12/2022   ALT 51 (H) 01/12/2022   PROT 7.4 01/12/2022   ALBUMIN 4.6 01/12/2022   CALCIUM 9.9 01/12/2022   ANIONGAP 8 04/25/2021   GFR 89.33 01/12/2022   Lab Results  Component Value Date   CHOL 188 01/12/2022   Lab Results  Component Value Date   HDL 57.40 01/12/2022   Lab Results  Component Value Date   LDLCALC 100 (H) 01/12/2022   Lab Results  Component Value Date   TRIG 151.0 (H) 01/12/2022   Lab  Results  Component Value Date   CHOLHDL 3 01/12/2022   Lab Results  Component Value Date   HGBA1C 6.7 (H) 01/12/2022      Assessment & Plan:   Problem List Items Addressed This Visit     Hyperlipidemia, mixed    Encourage heart healthy diet such as MIND or DASH diet, increase exercise, avoid trans fats, simple carbohydrates and processed foods, consider a krill or fish or flaxseed oil cap daily. Tolerating Rosuvastatin      Relevant Medications   losartan (COZAAR) 25 MG tablet   Other Relevant Orders   Lipid panel (Completed)   Hyperglycemia    hgba1c acceptable, minimize simple carbs. Increase exercise as tolerated.       Relevant Orders   Hemoglobin A1c (Completed)   Essential hypertension    Well controlled, no changes to meds. Encouraged  heart healthy diet such as the DASH diet and exercise as tolerated.       Relevant Medications   losartan (COZAAR) 25 MG tablet   Other Relevant Orders   CBC (Completed)   Comprehensive metabolic panel (Completed)   TSH (Completed)   Obesity    Encouraged DASH or MIND diet, decrease po intake and increase exercise as tolerated. Needs 7-8 hours of sleep nightly. Avoid trans fats, eat small, frequent meals every 4-5 hours with lean proteins, complex carbs and healthy fats. Minimize simple carbs, high fat foods and processed foods      Vitamin D deficiency    Supplement and monitor      Relevant Orders   VITAMIN D 25 Hydroxy (Vit-D Deficiency, Fractures) (Completed)   Low back pain    Encouraged moist heat and gentle stretching as tolerated. May try NSAIDs and prescription meds as directed and report if symptoms worsen or seek immediate care. Referred to Sports Medicine for further evaluation      Relevant Orders   Ambulatory referral to Sports Medicine   Abdominal pain - Primary    Abdominal ultrasound is ordered today, she will report if symptoms worsen or change      Relevant Orders   Urinalysis (Completed)   Urine Culture   US Abdomen Complete   Magnesium (Completed)   Meds ordered this encounter  Medications   losartan (COZAAR) 25 MG tablet    Sig: Take 1 tablet (25 mg total) by mouth daily.    Dispense:  90 tablet    Refill:  2   I, Penni Homans, MD, personally preformed the services described in this documentation.  All medical record entries made by the scribe were at my direction and in my presence.  I have reviewed the chart and discharge instructions (if applicable) and agree that the record reflects my personal performance and is accurate and complete. 01/12/2022  I,Mohammed Iqbal,acting as a scribe for Penni Homans, MD.,have documented all relevant documentation on the behalf of Penni Homans, MD,as directed by  Penni Homans, MD while in the presence of Penni Homans, MD.  Penni Homans, MD

## 2022-01-13 DIAGNOSIS — R109 Unspecified abdominal pain: Secondary | ICD-10-CM | POA: Insufficient documentation

## 2022-01-13 LAB — HEMOGLOBIN A1C: Hgb A1c MFr Bld: 6.7 % — ABNORMAL HIGH (ref 4.6–6.5)

## 2022-01-13 LAB — LIPID PANEL
Cholesterol: 188 mg/dL (ref 0–200)
HDL: 57.4 mg/dL (ref >39.00)
LDL Cholesterol: 100 mg/dL — ABNORMAL HIGH (ref 0–99)
NonHDL: 130.52
Total CHOL/HDL Ratio: 3
Triglycerides: 151 mg/dL — ABNORMAL HIGH (ref 0.0–149.0)
VLDL: 30.2 mg/dL (ref 0.0–40.0)

## 2022-01-13 LAB — URINE CULTURE
MICRO NUMBER:: 13794069
Result:: NO GROWTH
SPECIMEN QUALITY:: ADEQUATE

## 2022-01-13 LAB — URINALYSIS
Bilirubin Urine: NEGATIVE
Hgb urine dipstick: NEGATIVE
Ketones, ur: NEGATIVE
Leukocytes,Ua: NEGATIVE
Nitrite: NEGATIVE
Specific Gravity, Urine: 1.01 (ref 1.000–1.030)
Total Protein, Urine: NEGATIVE
Urine Glucose: NEGATIVE
Urobilinogen, UA: 0.2 (ref 0.0–1.0)
pH: 7 (ref 5.0–8.0)

## 2022-01-13 LAB — COMPREHENSIVE METABOLIC PANEL
ALT: 51 U/L — ABNORMAL HIGH (ref 0–35)
AST: 39 U/L — ABNORMAL HIGH (ref 0–37)
Albumin: 4.6 g/dL (ref 3.5–5.2)
Alkaline Phosphatase: 90 U/L (ref 39–117)
BUN: 10 mg/dL (ref 6–23)
CO2: 32 mEq/L (ref 19–32)
Calcium: 9.9 mg/dL (ref 8.4–10.5)
Chloride: 102 mEq/L (ref 96–112)
Creatinine, Ser: 0.68 mg/dL (ref 0.40–1.20)
GFR: 89.33 mL/min (ref >60.00)
Glucose, Bld: 96 mg/dL (ref 70–99)
Potassium: 4.2 mEq/L (ref 3.5–5.1)
Sodium: 139 mEq/L (ref 135–145)
Total Bilirubin: 0.5 mg/dL (ref 0.2–1.2)
Total Protein: 7.4 g/dL (ref 6.0–8.3)

## 2022-01-13 LAB — CBC
HCT: 41.8 % (ref 36.0–46.0)
Hemoglobin: 13.7 g/dL (ref 12.0–15.0)
MCHC: 32.8 g/dL (ref 30.0–36.0)
MCV: 90 fl (ref 78.0–100.0)
Platelets: 207 10*3/uL (ref 150.0–400.0)
RBC: 4.64 Mil/uL (ref 3.87–5.11)
RDW: 13.7 % (ref 11.5–15.5)
WBC: 5.5 10*3/uL (ref 4.0–10.5)

## 2022-01-13 LAB — TSH: TSH: 1.24 u[IU]/mL (ref 0.35–5.50)

## 2022-01-13 LAB — MAGNESIUM: Magnesium: 2.4 mg/dL (ref 1.5–2.5)

## 2022-01-13 LAB — VITAMIN D 25 HYDROXY (VIT D DEFICIENCY, FRACTURES): VITD: 43.61 ng/mL (ref 30.00–100.00)

## 2022-01-13 NOTE — Assessment & Plan Note (Signed)
Well controlled, no changes to meds. Encouraged heart healthy diet such as the DASH diet and exercise as tolerated.  °

## 2022-01-13 NOTE — Assessment & Plan Note (Signed)
hgba1c acceptable, minimize simple carbs. Increase exercise as tolerated.  

## 2022-01-13 NOTE — Assessment & Plan Note (Signed)
Supplement and monitor 

## 2022-01-13 NOTE — Assessment & Plan Note (Signed)
Encouraged moist heat and gentle stretching as tolerated. May try NSAIDs and prescription meds as directed and report if symptoms worsen or seek immediate care. Referred to Sports Medicine for further evaluation

## 2022-01-13 NOTE — Assessment & Plan Note (Signed)
Encourage heart healthy diet such as MIND or DASH diet, increase exercise, avoid trans fats, simple carbohydrates and processed foods, consider a krill or fish or flaxseed oil cap daily.  Tolerating Rosuvastatin 

## 2022-01-13 NOTE — Assessment & Plan Note (Signed)
Abdominal ultrasound is ordered today, she will report if symptoms worsen or change

## 2022-01-13 NOTE — Assessment & Plan Note (Signed)
Encouraged DASH or MIND diet, decrease po intake and increase exercise as tolerated. Needs 7-8 hours of sleep nightly. Avoid trans fats, eat small, frequent meals every 4-5 hours with lean proteins, complex carbs and healthy fats. Minimize simple carbs, high fat foods and processed foods 

## 2022-01-16 ENCOUNTER — Other Ambulatory Visit (HOSPITAL_BASED_OUTPATIENT_CLINIC_OR_DEPARTMENT_OTHER): Payer: Self-pay | Admitting: Family Medicine

## 2022-01-16 ENCOUNTER — Ambulatory Visit (HOSPITAL_BASED_OUTPATIENT_CLINIC_OR_DEPARTMENT_OTHER)
Admission: RE | Admit: 2022-01-16 | Discharge: 2022-01-16 | Disposition: A | Discharge status: Home / Self Care | Payer: Medicare (Managed Care) | Source: Ambulatory Visit | Attending: Family Medicine | Admitting: Family Medicine

## 2022-01-16 ENCOUNTER — Other Ambulatory Visit (INDEPENDENT_AMBULATORY_CARE_PROVIDER_SITE_OTHER): Payer: Medicare (Managed Care)

## 2022-01-16 DIAGNOSIS — R109 Unspecified abdominal pain: Secondary | ICD-10-CM

## 2022-01-16 DIAGNOSIS — R194 Change in bowel habit: Secondary | ICD-10-CM

## 2022-01-16 DIAGNOSIS — Z1231 Encounter for screening mammogram for malignant neoplasm of breast: Secondary | ICD-10-CM

## 2022-01-17 ENCOUNTER — Telehealth: Payer: Self-pay | Admitting: *Deleted

## 2022-01-17 ENCOUNTER — Ambulatory Visit (HOSPITAL_BASED_OUTPATIENT_CLINIC_OR_DEPARTMENT_OTHER)
Admission: RE | Admit: 2022-01-17 | Discharge: 2022-01-17 | Disposition: A | Discharge status: Home / Self Care | Payer: Medicare (Managed Care) | Source: Ambulatory Visit | Attending: Family Medicine | Admitting: Family Medicine

## 2022-01-17 ENCOUNTER — Encounter (HOSPITAL_BASED_OUTPATIENT_CLINIC_OR_DEPARTMENT_OTHER): Payer: Self-pay

## 2022-01-17 ENCOUNTER — Other Ambulatory Visit: Payer: Self-pay | Admitting: *Deleted

## 2022-01-17 DIAGNOSIS — Z1231 Encounter for screening mammogram for malignant neoplasm of breast: Secondary | ICD-10-CM | POA: Insufficient documentation

## 2022-01-17 DIAGNOSIS — R109 Unspecified abdominal pain: Secondary | ICD-10-CM

## 2022-01-17 DIAGNOSIS — Z86010 Personal history of colonic polyps: Secondary | ICD-10-CM

## 2022-01-17 DIAGNOSIS — R195 Other fecal abnormalities: Secondary | ICD-10-CM

## 2022-01-17 LAB — FECAL OCCULT BLOOD, IMMUNOCHEMICAL: Fecal Occult Bld: POSITIVE — AB

## 2022-01-17 NOTE — Telephone Encounter (Signed)
Received call from Margarita Grizzle at [facility] reporting positive IFOB.

## 2022-01-23 ENCOUNTER — Encounter: Payer: Self-pay | Admitting: Physician Assistant

## 2022-01-23 ENCOUNTER — Ambulatory Visit (INDEPENDENT_AMBULATORY_CARE_PROVIDER_SITE_OTHER): Payer: Medicare (Managed Care) | Admitting: Family Medicine

## 2022-01-23 ENCOUNTER — Ambulatory Visit (INDEPENDENT_AMBULATORY_CARE_PROVIDER_SITE_OTHER): Payer: Medicare (Managed Care) | Admitting: Physician Assistant

## 2022-01-23 ENCOUNTER — Encounter: Payer: Self-pay | Admitting: Family Medicine

## 2022-01-23 VITALS — BP 124/64 | HR 72 | Ht 62.0 in | Wt 192.6 lb

## 2022-01-23 VITALS — BP 129/71 | Ht 62.0 in | Wt 192.0 lb

## 2022-01-23 DIAGNOSIS — R195 Other fecal abnormalities: Secondary | ICD-10-CM

## 2022-01-23 DIAGNOSIS — M47816 Spondylosis without myelopathy or radiculopathy, lumbar region: Secondary | ICD-10-CM

## 2022-01-23 DIAGNOSIS — M222X1 Patellofemoral disorders, right knee: Secondary | ICD-10-CM | POA: Diagnosis not present

## 2022-01-23 DIAGNOSIS — R1013 Epigastric pain: Secondary | ICD-10-CM | POA: Diagnosis not present

## 2022-01-23 DIAGNOSIS — K760 Fatty (change of) liver, not elsewhere classified: Secondary | ICD-10-CM

## 2022-01-23 MED ORDER — OMEPRAZOLE 40 MG PO CPDR: 40.0000 mg | DELAYED_RELEASE_CAPSULE | Freq: Every day | ORAL | 1 refills | Status: DC

## 2022-01-23 NOTE — Assessment & Plan Note (Signed)
Acute on chronic in nature.  Pain is more anterior with no effusion appreciated on exam today. -Counseled on home exercise therapy and supportive care. -Could consider injection or physical therapy or further imaging.

## 2022-01-23 NOTE — Progress Notes (Signed)
  Margaret [name] - 71 y.o. adult MRN [id number]  Date of birth: March 30, 1951  SUBJECTIVE:  Including CC & ROS.  No chief complaint on file.   Margaret [name] is a 71 y.o. adult that is presenting with acute on chronic low back pain.  She has intermittent pain down her legs.  She has tried physical therapy in the past.  No recent injury or inciting event.  She is planning on going to a trip to [place] in November.  Review of the office note from 1/110/22 he decided to pursue an MRI of the lumbar spine. Reviewed the office note from 05/03/2021 shows she was referred for physical therapy. Independent review of the lumbar spine x-ray from 03/29/2021 shows facet arthrosis. Independent review of the MRI lumbar spine from 04/27/2021 shows L4-5 facet arthropathy.   Review of Systems See HPI   HISTORY: Past Medical, Surgical, Social, and Family History Reviewed & Updated per EMR.   Pertinent Historical Findings include:  Past Medical History:  Diagnosis Date   Allergic state 03/25/2012   Allergy    Aortic calcification ([place]) 08/27/2014   Atherosclerosis of aorta ([place]) 04/12/2014   Back pain    Constipation 01/23/2017   GERD (gastroesophageal reflux disease)    Hiatal hernia with gastroesophageal reflux 04/27/2010   Qualifier: Diagnosis of  By: Danelle Earthly CMA, Darlene  Failed Omeprazole and Protonix    Hx of adenomatous polyp of colon 01/23/2017   Colonoscopy 01/16/2017 with 2 polyps one was adenomatous. recommeded follow up in 5 years.performed by Dr Silverio Decamp   Hyperlipidemia    Hypertension    Joint pain    Osteopenia 08/31/2012   Peripheral edema 12/13/2014   Sleep apnea    Unspecified sinusitis (chronic) 03/30/2013    Past Surgical History:  Procedure Laterality Date   24 HOUR [place] STUDY N/A 12/15/2015   Procedure: 24 HOUR PH STUDY;  Surgeon: Mauri Pole, MD;  Location: [facility];  Service: Endoscopy;  Laterality: N/A;   DILATION AND CURETTAGE OF UTERUS     ESOPHAGEAL MANOMETRY N/A  12/15/2015   Procedure: ESOPHAGEAL MANOMETRY (EM);  Surgeon: Mauri Pole, MD;  Location: [facility];  Service: Endoscopy;  Laterality: N/A;     PHYSICAL EXAM:  VS: BP 129/71 (BP Location: Left Arm, Patient Position: Sitting)   Ht '5\' 2"'$  (1.575 m)   Wt 192 lb (87.1 kg)   BMI 35.12 kg/m  Physical Exam Gen: NAD, alert, cooperative with exam, well-appearing MSK:  Neurovascularly intact       ASSESSMENT & PLAN:   Patellofemoral pain syndrome of right knee Acute on chronic in nature.  Pain is more anterior with no effusion appreciated on exam today. -Counseled on home exercise therapy and supportive care. -Could consider injection or physical therapy or further imaging.  Facet arthritis of lumbar region Acute on chronic in nature.  She has completed physical therapy and tried other medications.  Recently having a work-up for GI bleed. -Counseled on home exercise therapy and supportive care. -Counseled on compression. -Could consider facet injections

## 2022-01-23 NOTE — Assessment & Plan Note (Signed)
Acute on chronic in nature.  She has completed physical therapy and tried other medications.  Recently having a work-up for GI bleed. -Counseled on home exercise therapy and supportive care. -Counseled on compression. -Could consider facet injections

## 2022-01-23 NOTE — Progress Notes (Signed)
Subjective:    Patient ID: Margaret [name], adult    DOB: 04-26-1951, 71 y.o.   MRN: [id number]  HPI  "Margaret [name]" is a pleasant 71 yo female, established with Dr. Silverio Decamp who comes in today after recent ER visit yesterday with concerns for dark stools.  She also had felt lightheaded off and on for the previous 3 days.  She noted a dark stool yesterday and became concerned about bleeding.  In retrospect she had been seeing some dark stools off and on over the past couple of weeks. She had mentioned changes with stool color to her PCP Dr. Maryellen Pile earlier this month and a fecal occult blood was ordered and this returned positive on 01/17/2022.  With the ER evaluation yesterday stool was documented heme negative. Labs were done showing WBC 5.7/hemoglobin 14.4/hematocrit 43.3, glucose 110/AST 49/ALT 64 remainder of LFTs within normal limits. She had CT of the abdomen and pelvis done which showed moderate to severe fatty infiltration of the liver, normal-appearing gallbladder, 4 cm left uterine fibroid and otherwise negative. She had had upper abdominal ultrasound done outpatient on 01/16/2022 that showed septated cyst in the right hepatic lobe and increased echogenicity of the liver.  Patient says she has not been using any NSAIDs on a regular basis, does take a baby aspirin daily, no EtOH, no Pepto-Bismol use.  She has been drinking smoothies as she puts fruits and vegetables in, says she has been eating a fair amount of berries but does not feel that these foods should be causing her dark stool. Does get intermittent upper abdominal bloating and "hardness" in her upper abdomen this occurs frequently early in the morning hours even before eating and sometimes will get progressively worse after eating.  She has used omeprazole intermittently and says that seems to help this symptom but has not stayed on it on a regular basis.  No current complaints of nausea or vomiting, no hematemesis, no heartburn or  indigestion no dysphagia or odynophagia.  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 pylori. Colonoscopy October 2021 with a 5 mm polyp in the descending colon, 2 sessile polyps in the rectosigmoid measuring 1 to 2 mm and all 3 of the polyps were hyperplastic, noted to have scattered right and left colon diverticulosis and internal hemorrhoids.  She has also undergone prior hemorrhoidal banding, had 3 sessions in 2019. Other medical issues include hypertension, prior CVA/TIA, obstructive sleep apnea with no oxygen use,  Review of Systems Pertinent positive and negative review of systems were noted in the above HPI section.  All other review of systems was otherwise negative.   Outpatient Encounter Medications as of 01/23/2022  Medication Sig   amLODipine (NORVASC) 5 MG tablet TAKE 1 TABLET(5 MG) BY MOUTH DAILY   aspirin 81 MG chewable tablet Chew by mouth daily.   carvedilol (COREG) 6.25 MG tablet TAKE 1 TABLET BY MOUTH 2 TIMES DAILY WITH A MEAL.   fish oil-omega-3 fatty acids 1000 MG capsule Take 2 g by mouth daily.   fluticasone (FLONASE) 50 MCG/ACT nasal spray Place 2 sprays into both nostrils daily.   losartan (COZAAR) 25 MG tablet Take 1 tablet (25 mg total) by mouth daily.   meclizine (ANTIVERT) 25 MG tablet Take  1 tablet (25 mg total) by mouth 3 (three) times daily as needed for dizziness.   Multiple Vitamin (MULTIVITAMIN WITH MINERALS) TABS tablet Take 1 tablet by mouth daily.   rosuvastatin (CRESTOR) 10 MG tablet Take 1 tablet (10 mg total) by mouth daily.   tiZANidine (ZANAFLEX) 2 MG tablet Take 0.5-2 tablets (1-4 mg total) by mouth 2 (two) times daily as needed for muscle spasms.   VITAMIN D PO Take 2,500 Units by mouth  daily.   Zinc 50 MG TABS Take 1 tablet by mouth daily.   [DISCONTINUED] omeprazole (PRILOSEC) 40 MG capsule Take 1 capsule (40 mg total) by mouth daily.   omeprazole (PRILOSEC) 40 MG capsule Take 1 capsule (40 mg total) by mouth daily.   No facility-administered encounter medications on file as of 01/23/2022.   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   LAD (lymphadenopathy), anterior cervical 07/10/2021   Vertigo 05/17/2021   Facet arthritis of lumbar region 04/07/2021   COVID-19 02/21/2021   UTI (urinary tract infection) 08/01/2020   Muscle cramps 06/24/2020   Cough 06/22/2019   Insomnia 05/29/2019   Educated about COVID-19 virus infection 09/20/2018   Headache 04/30/2018   Other fatigue 04/03/2018   Dyspnea 04/03/2018   Obstructive sleep apnea syndrome 04/03/2018   Vitamin D deficiency 04/03/2018   Obesity 01/22/2018   Neck pain 12/16/2017   Fatty infiltration of liver 12/16/2017   Hx of colonic polyps 01/23/2017   Constipation 01/23/2017   Cervical radiculopathy    Stroke (cerebrum) ([place]) 09/06/2016   TIA (transient ischemic attack) 09/06/2016   Right hand weakness    Peripheral edema 12/13/2014   Aortic atherosclerosis ([place]) 04/12/2014   Skin lesion of back 03/31/2014   Joint stiffness 03/31/2014   Dysuria 02/08/2014   Essential hypertension 02/08/2014   Atypical chest pain 01/21/2014   Hyperglycemia 07/27/2013   Sinusitis 03/30/2013   Preventative health care 03/30/2013   Left shoulder pain 11/14/2012   Osteopenia 08/31/2012   Back pain 03/25/2012   Patellofemoral pain syndrome of right knee 03/25/2012   Allergic state 19/41/7408   Helicobacter pylori infection 06/08/2010   Hiatal hernia with gastroesophageal reflux 04/27/2010   Hyperlipidemia, mixed 04/07/2010   Social History   Socioeconomic  History   Marital status: Married    Spouse name: [name]   Number of children: 2   Years of education: 16   Highest education level: Not on file  Occupational History   Occupation: Optometrist    Comment: retired  Tobacco Use   Smoking status: Never   Smokeless tobacco: Never  Vaping Use   Vaping Use: Never used  Substance and Sexual Activity   Alcohol use: Yes    Alcohol/week: 0.0 standard drinks of alcohol    Comment: Rarely   Drug use: No   Sexual activity: Not Currently    Comment: no dietary restrictions, lives with husand, works at [employer]  Other Topics Concern   Not on file  Social History Narrative   Lives with husband   Caffeine- tea, 2 cups daily   Social Determinants of Health   Financial Resource Strain: Not on file  Food Insecurity: Not on file  Transportation Needs: Not on file  Physical Activity: Not on file  Stress: Not on file  Social Connections: Not on file  Intimate Partner Violence: Not on file  [name] family history includes Diabetes in her sister; High Cholesterol in her mother; High blood pressure in her mother; Hyperlipidemia in her sister; Hypertension in her sister and sister; Lung cancer in her father; Obesity in her son; Sleep apnea in her son; Stroke in her mother and sister; Thyroid disease in her sister.      Objective:    Vitals:   01/23/22 1357  BP: 124/64  Pulse: 72    Physical Exam Well-developed well-nourished female  in no acute distress.  Height, Weight,192 BMI 35.2  HEENT; nontraumatic normocephalic, EOMI, PE R LA, sclera anicteric. Oropharynx;not examined Neck; supple, no JVD Cardiovascular; regular rate and rhythm with S1-S2, no murmur rub or gallop Pulmonary; Clear bilaterally Abdomen; soft, nontender, nondistended, no palpable mass or hepatosplenomegaly, bowel sounds are active Rectal;not done today - heme negative in ER yesterday  Skin; benign exam, no jaundice rash or appreciable lesions Extremities; no  clubbing cyanosis or edema skin warm and dry Neuro/Psych; alert and oriented x4, grossly nonfocal mood and affect appropriate        Assessment & Plan:   #73 71 year old female with heme positive stool documented earlier this month, ER visit yesterday due to concerns with dark stool and also some intermittent lightheadedness, documented Hemoccult negative, and with normal hemoglobin of 14.4 Patient has been noticing intermittent dark stools over the past few weeks, has chronic intermittent issues with upper abdominal bloating currently occurring early in the morning prior to eating and worse postprandially. CT of the abdomen pelvis yesterday negative other than moderate to severe fatty infiltration of the liver and a 4 cm uterine fibroid.  She does have history of gastritis documented on EGD October 2021 and had a hyperplastic gastric polyp that was ulcerated and biopsied Consider gastritis, peptic ulcer disease, or ulcerated gastric polyp as etiology for dark stool/heme positive stool  #2 diverticulosis #3.  History of internal hemorrhoids status post hemorrhoidal banding x3 sessions 2019 History of colon polyps-up-to-date with colonoscopy last done October 2021, 3 hyperplastic polyps removed.  #4 fatty liver noted on CT and ultrasound Patient has had mildly elevated transaminases suspect NASH  #5 sleep apnea #6.  History of CVA/TIA #7.  Hypertension  Plan; Will refill omeprazole 40 mg and have asked her to take this 1 p.o. every morning AC breakfast on a daily basis until EGD. Patient will be scheduled for EGD with Dr. Silverio Decamp .  Procedure was discussed in detail with the patient including indications ,risk and benefits and she is agreeable to proceed. Further recommendations pending findings at EGD.  Regarding fatty liver disease we discussed initial management with weight loss of 5 to 7%, daily exercise, in the form of walking 5 days/week, low-carb diet, and avoidance of EtOH.  She  is motivated to work on weight loss and diet. Would plan office follow-up with Dr. Silverio Decamp to further evaluate fatty liver disease in 3 to 4 months.   Annalee Meyerhoff Genia Harold PA-C 01/23/2022   Cc: Mosie Lukes, MD

## 2022-01-23 NOTE — Patient Instructions (Addendum)
If you are age 69 or older, your body mass index should be between 23-30. Your Body mass index is 35.23 kg/m. If this is out of the aforementioned range listed, please consider follow up with your Primary Care Provider. ________________________________________________________  The [facility] providers would like to encourage you to use [facility] to communicate with providers for non-urgent requests or questions.  Due to long hold times on the telephone, sending your provider a message by [facility] may be a faster and more efficient way to get a response.  Please allow 48 business hours for a response.  Please remember that this is for non-urgent requests.  _______________________________________________________  Dennis Bast have been scheduled for an endoscopy. Please follow written instructions given to you at your visit today. If you use inhalers (even only as needed), please bring them with you on the day of your procedure.  Refills of Omeprazole 40 mg have been sent to your pharmacy. Be sure to take this every day  You have been scheduled to follow up with Dr. Silverio Decamp on November 14 at 2:10 pm to discuss your fatty liver. Be sure to work on a low carb diet and 15 pound weight loss.  Thank you for entrusting me with your care and choosing [facility].  Amy Esterwood, PA-C

## 2022-01-23 NOTE — Patient Instructions (Signed)
Nice to meet you Please try heat on the back and ice on the knee  Please consider compression for the back  Please try the exercises  Please try voltaren over the counter   Please send me a message in MyChart with any questions or updates.  Please see me back in 4 weeks.   --Dr. Raeford Razor

## 2022-02-21 ENCOUNTER — Telehealth: Payer: Self-pay | Admitting: Gastroenterology

## 2022-02-21 NOTE — Telephone Encounter (Signed)
Inbound call from patient rescheduling upcoming procedure due to her not feeling well.

## 2022-02-21 NOTE — Telephone Encounter (Signed)
Called the patient to ask if she had any questions. Procedure is rescheduled to a different day at the same time as was planned for 02/24/22. No answer. Left her a message with my name and contact information, encouraging her to call if she had questions.

## 2022-02-22 ENCOUNTER — Ambulatory Visit: Payer: Medicare (Managed Care) | Admitting: Family Medicine

## 2022-02-24 ENCOUNTER — Encounter: Payer: Medicare (Managed Care) | Admitting: Gastroenterology

## 2022-02-27 ENCOUNTER — Ambulatory Visit: Payer: Medicare (Managed Care) | Admitting: Family Medicine

## 2022-02-28 ENCOUNTER — Telehealth: Payer: Self-pay | Admitting: Gastroenterology

## 2022-02-28 NOTE — Telephone Encounter (Signed)
Good Morning Dr. Silverio Decamp,  Patient called stating that she needed to reschedule her EGD for 10/5 at 10:00 due to not feeling well, feverish and having a cough.   Patient was rescheduled for 10/31 at 10:00.

## 2022-03-02 ENCOUNTER — Other Ambulatory Visit: Payer: Self-pay

## 2022-03-02 ENCOUNTER — Encounter: Payer: Medicare (Managed Care) | Admitting: Gastroenterology

## 2022-03-02 MED ORDER — CARVEDILOL 6.25 MG PO TABS: 6.2500 mg | ORAL_TABLET | Freq: Two times a day (BID) | ORAL | 5 refills | Status: DC

## 2022-03-06 ENCOUNTER — Encounter: Payer: Self-pay | Admitting: Certified Registered Nurse Anesthetist

## 2022-03-07 ENCOUNTER — Encounter: Payer: Self-pay | Admitting: Family Medicine

## 2022-03-07 ENCOUNTER — Other Ambulatory Visit (HOSPITAL_BASED_OUTPATIENT_CLINIC_OR_DEPARTMENT_OTHER): Payer: Self-pay

## 2022-03-07 ENCOUNTER — Ambulatory Visit: Payer: Medicare (Managed Care) | Admitting: Family Medicine

## 2022-03-07 ENCOUNTER — Ambulatory Visit (INDEPENDENT_AMBULATORY_CARE_PROVIDER_SITE_OTHER): Payer: Medicare (Managed Care) | Admitting: Family Medicine

## 2022-03-07 VITALS — BP 122/70 | Ht 62.0 in | Wt 192.0 lb

## 2022-03-07 DIAGNOSIS — M47816 Spondylosis without myelopathy or radiculopathy, lumbar region: Secondary | ICD-10-CM

## 2022-03-07 DIAGNOSIS — M5412 Radiculopathy, cervical region: Secondary | ICD-10-CM | POA: Diagnosis not present

## 2022-03-07 MED ORDER — LIDOCAINE 5 % EX PTCH: 1.0000 | MEDICATED_PATCH | Freq: Two times a day (BID) | CUTANEOUS | 2 refills | Status: DC

## 2022-03-07 MED ORDER — FLUAD QUADRIVALENT 0.5 ML IM PRSY
PREFILLED_SYRINGE | INTRAMUSCULAR | 0 refills | Status: DC
  Filled 2022-03-07: qty 0.5, 1d supply, fill #0

## 2022-03-07 NOTE — Progress Notes (Deleted)
   I, Peterson Lombard, LAT, ATC acting as a scribe for Lynne Leader, MD.  Margaret [name] is a 71 y.o. adult who presents to [place] at [facility] today for back pain.  Patient was last seen for this complaint on 05/03/2021 by [facility], and was referred to physical therapy, completed 8 visits, and being discharged on 08/23/2021.  Today, patient reports back pain returning?  Patient locates pain to  Radiating pain: LE numbness/tingling:  LE weakness: Aggravates: Treatments tried:  Dx imaging: 04/27/2021 lumbar MRI 03/29/2021 thoracic and lumbar MRI 12/12/2017 lumbar x-ray  Pertinent review of systems: ***  Relevant historical information: ***   Exam:  There were no vitals taken for this visit. General: Well Developed, well nourished, and in no acute distress.   MSK: ***    Lab and Radiology Results No results found for this or any previous visit (from the past 72 hour(s)). No results found.     Assessment and Plan: 71 y.o. adult with ***   PDMP not reviewed this encounter. No orders of the defined types were placed in this encounter.  No orders of the defined types were placed in this encounter.    Discussed warning signs or symptoms. Please see discharge instructions. Patient expresses understanding.   ***

## 2022-03-07 NOTE — Progress Notes (Signed)
  Margaret [name] - 71 y.o. adult MRN [id number]  Date of birth: 09-16-1950  SUBJECTIVE:  Including CC & ROS.  No chief complaint on file.   Margaret [name] is a 71 y.o. adult that is  following up for her back pain. Has gotten mild improvement with the compression. Has tried physical therapy in the past.    Review of Systems See HPI   HISTORY: Past Medical, Surgical, Social, and Family History Reviewed & Updated per EMR.   Pertinent Historical Findings include:  Past Medical History:  Diagnosis Date   Allergic state 03/25/2012   Allergy    Aortic calcification ([place]) 08/27/2014   Atherosclerosis of aorta ([place]) 04/12/2014   Back pain    Constipation 01/23/2017   GERD (gastroesophageal reflux disease)    Hiatal hernia with gastroesophageal reflux 04/27/2010   Qualifier: Diagnosis of  By: Danelle Earthly CMA, Darlene  Failed Omeprazole and Protonix    Hx of adenomatous polyp of colon 01/23/2017   Colonoscopy 01/16/2017 with 2 polyps one was adenomatous. recommeded follow up in 5 years.performed by Dr Silverio Decamp   Hyperlipidemia    Hypertension    Joint pain    Osteopenia 08/31/2012   Peripheral edema 12/13/2014   Sleep apnea    Unspecified sinusitis (chronic) 03/30/2013    Past Surgical History:  Procedure Laterality Date   24 HOUR [place] STUDY N/A 12/15/2015   Procedure: 24 HOUR PH STUDY;  Surgeon: Mauri Pole, MD;  Location: [facility];  Service: Endoscopy;  Laterality: N/A;   DILATION AND CURETTAGE OF UTERUS     ESOPHAGEAL MANOMETRY N/A 12/15/2015   Procedure: ESOPHAGEAL MANOMETRY (EM);  Surgeon: Mauri Pole, MD;  Location: [facility];  Service: Endoscopy;  Laterality: N/A;     PHYSICAL EXAM:  VS: BP 122/70 (BP Location: Left Arm, Patient Position: Sitting)   Ht '5\' 2"'$  (1.575 m)   Wt 192 lb (87.1 kg)   BMI 35.12 kg/m  Physical Exam Gen: NAD, alert, cooperative with exam, well-appearing MSK:  Neurovascularly intact       ASSESSMENT & PLAN:   Facet arthritis of  lumbar region Ongoing. Notices pain with any prolonged standing. Has stiffness when transitioning from sitting to standing.  - counseled on home exercise therapy and supportive care - Lidoderm patches  - could consider facet injections.

## 2022-03-07 NOTE — Patient Instructions (Signed)
Good to see you Please continue heat  Please try the exercises   Please send me a message in MyChart with any questions or updates.  Please see me back in 4-6 weeks.   --Dr. Raeford Razor

## 2022-03-07 NOTE — Assessment & Plan Note (Signed)
Ongoing. Notices pain with any prolonged standing. Has stiffness when transitioning from sitting to standing.  - counseled on home exercise therapy and supportive care - Lidoderm patches  - could consider facet injections.

## 2022-03-14 ENCOUNTER — Ambulatory Visit (AMBULATORY_SURGERY_CENTER): Payer: Medicare (Managed Care) | Admitting: Gastroenterology

## 2022-03-14 ENCOUNTER — Other Ambulatory Visit: Payer: Self-pay | Admitting: Gastroenterology

## 2022-03-14 ENCOUNTER — Encounter: Payer: Self-pay | Admitting: Gastroenterology

## 2022-03-14 VITALS — BP 129/57 | HR 60 | Temp 98.0°F | Resp 16 | Ht 62.0 in | Wt 192.0 lb

## 2022-03-14 DIAGNOSIS — R1013 Epigastric pain: Secondary | ICD-10-CM

## 2022-03-14 DIAGNOSIS — K2950 Unspecified chronic gastritis without bleeding: Secondary | ICD-10-CM

## 2022-03-14 DIAGNOSIS — K31A0 Gastric intestinal metaplasia, unspecified: Secondary | ICD-10-CM

## 2022-03-14 DIAGNOSIS — K2970 Gastritis, unspecified, without bleeding: Secondary | ICD-10-CM

## 2022-03-14 DIAGNOSIS — K317 Polyp of stomach and duodenum: Secondary | ICD-10-CM | POA: Diagnosis not present

## 2022-03-14 MED ORDER — SUCRALFATE 1 G PO TABS: 1.0000 g | ORAL_TABLET | Freq: Three times a day (TID) | ORAL | 3 refills | Status: DC

## 2022-03-14 MED ORDER — SODIUM CHLORIDE 0.9 % IV SOLN: 500.0000 mL | Freq: Once | INTRAVENOUS | Status: DC

## 2022-03-14 NOTE — Progress Notes (Unsigned)
Report given to PACU, vss 

## 2022-03-14 NOTE — Patient Instructions (Signed)
Please read handouts provided. Continue present medications. Await pathology results. Return to GI office at the next available appointment.   YOU HAD AN ENDOSCOPIC PROCEDURE TODAY AT [facility]:   Refer to the procedure report that was given to you for any specific questions about what was found during the examination.  If the procedure report does not answer your questions, please call your gastroenterologist to clarify.  If you requested that your care partner not be given the details of your procedure findings, then the procedure report has been included in a sealed envelope for you to review at your convenience later.  YOU SHOULD EXPECT: Some feelings of bloating in the abdomen. Passage of more gas than usual.  Walking can help get rid of the air that was put into your GI tract during the procedure and reduce the bloating. If you had a lower endoscopy (such as a colonoscopy or flexible sigmoidoscopy) you may notice spotting of blood in your stool or on the toilet paper. If you underwent a bowel prep for your procedure, you may not have a normal bowel movement for a few days.  Please Note:  You might notice some irritation and congestion in your nose or some drainage.  This is from the oxygen used during your procedure.  There is no need for concern and it should clear up in a day or so.  SYMPTOMS TO REPORT IMMEDIATELY:  Following upper endoscopy (EGD)  Vomiting of blood or coffee ground material  New chest pain or pain under the shoulder blades  Painful or persistently difficult swallowing  New shortness of breath  Fever of 100F or higher  Black, tarry-looking stools  For urgent or emergent issues, a gastroenterologist can be reached at any hour by calling [phone]. Do not use MyChart messaging for urgent concerns.    DIET:  We do recommend a small meal at first, but then you may proceed to your regular diet.  Drink plenty of fluids but you should avoid  alcoholic beverages for 24 hours.  ACTIVITY:  You should plan to take it easy for the rest of today and you should NOT DRIVE or use heavy machinery until tomorrow (because of the sedation medicines used during the test).    FOLLOW UP: Our staff will call the number listed on your records the next business day following your procedure.  We will call around 7:15- 8:00 am to check on you and address any questions or concerns that you may have regarding the information given to you following your procedure. If we do not reach you, we will leave a message.     If any biopsies were taken you will be contacted by phone or by letter within the next 1-3 weeks.  Please call us at [phone] if you have not heard about the biopsies in 3 weeks.    SIGNATURES/CONFIDENTIALITY: You and/or your care partner have signed paperwork which will be entered into your electronic medical record.  These signatures attest to the fact that that the information above on your After Visit Summary has been reviewed and is understood.  Full responsibility of the confidentiality of this discharge information lies with you and/or your care-partner.

## 2022-03-14 NOTE — Op Note (Signed)
[facility] Patient Name: Margaret [name] Procedure Date: 03/14/2022 9:41 AM MRN: [id number] Endoscopist: Mauri Pole , MD Age: 71 Referring MD:  Date of Birth: [birth year] Gender: Female Account #: [id number] Procedure:                Upper GI endoscopy Indications:              Epigastric abdominal pain, globus seansation, h/o                            H.pylori gastritis Medicines:                Monitored Anesthesia Care Procedure:                Pre-Anesthesia Assessment:                           - Prior to the procedure, a History and Physical                            was performed, and patient medications and                            allergies were reviewed. The patient's tolerance of                            previous anesthesia was also reviewed. The risks                            and benefits of the procedure and the sedation                            options and risks were discussed with the patient.                            All questions were answered, and informed consent                            was obtained. Prior Anticoagulants: The patient has                            taken no previous anticoagulant or antiplatelet                            agents. ASA Grade Assessment: II - A patient with                            mild systemic disease. After reviewing the risks                            and benefits, the patient was deemed in                            satisfactory condition to undergo the procedure.  After obtaining informed consent, the endoscope was                            passed under direct vision. Throughout the                            procedure, the patient's blood pressure, pulse, and                            oxygen saturations were monitored continuously. The                            GIF [id number] #[id number] was introduced through the                            mouth, and advanced to the  second part of duodenum.                            The upper GI endoscopy was accomplished without                            difficulty. The patient tolerated the procedure                            well. Scope In: Scope Out: Findings:                 The Z-line was regular and was found 36 cm from the                            incisors.                           No gross lesions were noted in the entire esophagus.                           Patchy mild inflammation characterized by                            congestion (edema), erosions, friability and mucus                            was found in the cardia, in the gastric fundus, in                            the gastric antrum and in the prepyloric region of                            the stomach. Biopsies were taken with a cold                            forceps for Helicobacter pylori testing.                           Two 5-15  mm pedunculated and sessile polyps with no                            bleeding and stigmata of recent bleeding were found                            in the gastric body. Biopsies were taken with a                            cold forceps for histology.                           The cardia and gastric fundus were normal on                            retroflexion.                           The examined duodenum was normal. Complications:            No immediate complications. Estimated Blood Loss:     Estimated blood loss was minimal. Impression:               - Z-line regular, 36 cm from the incisors.                           - No gross lesions in esophagus.                           - Gastritis. Biopsied.                           - Two gastric polyps. Biopsied.                           - Normal examined duodenum. Recommendation:           - Resume previous diet.                           - Continue present medications.                           - Await pathology results.                           -  Return to GI office at the next available                            appointment. Mauri Pole, MD 03/14/2022 10:11:08 AM This report has been signed electronically.

## 2022-03-14 NOTE — Progress Notes (Unsigned)
Vitals-DT  Called to room to assist during endoscopic procedure.  Patient ID and intended procedure confirmed with present staff. Received instructions for my participation in the procedure from the performing physician. 

## 2022-03-14 NOTE — Progress Notes (Signed)
[facility] History and Physical   Primary Care Physician:  Mosie Lukes, MD   Reason for Procedure:  Epigastric pain, GERD, h/o H.pylori  Plan:    EGD with possible interventions as needed     HPI: Margaret [name] is a very pleasant 71 y.o. adult here for EGD for surveillance with h/o refractory H.pylori gastritis and metaplasia.   The risks and benefits as well as alternatives of endoscopic procedure(s) have been discussed and reviewed. All questions answered. The patient agrees to proceed.    Past Medical History:  Diagnosis Date   Allergic state 03/25/2012   Allergy    Aortic calcification ([place]) 08/27/2014   Atherosclerosis of aorta ([place]) 04/12/2014   Back pain    Constipation 01/23/2017   GERD (gastroesophageal reflux disease)    Hiatal hernia with gastroesophageal reflux 04/27/2010   Qualifier: Diagnosis of  By: Danelle Earthly CMA, Darlene  Failed Omeprazole and Protonix    Hx of adenomatous polyp of colon 01/23/2017   Colonoscopy 01/16/2017 with 2 polyps one was adenomatous. recommeded follow up in 5 years.performed by Dr Silverio Decamp   Hyperlipidemia    Hypertension    Joint pain    Osteopenia 08/31/2012   Peripheral edema 12/13/2014   Sleep apnea    Unspecified sinusitis (chronic) 03/30/2013    Past Surgical History:  Procedure Laterality Date   24 HOUR [place] STUDY N/A 12/15/2015   Procedure: 24 HOUR PH STUDY;  Surgeon: Mauri Pole, MD;  Location: [facility];  Service: Endoscopy;  Laterality: N/A;   DILATION AND CURETTAGE OF UTERUS     ESOPHAGEAL MANOMETRY N/A 12/15/2015   Procedure: ESOPHAGEAL MANOMETRY (EM);  Surgeon: Mauri Pole, MD;  Location: [facility];  Service: Endoscopy;  Laterality: N/A;    Prior to Admission medications   Medication Sig Start Date End Date Taking? Authorizing Provider  amLODipine (NORVASC) 5 MG tablet TAKE 1 TABLET(5 MG) BY MOUTH DAILY 09/12/21  Yes Mosie Lukes, MD  aspirin 81 MG chewable tablet Chew by mouth  daily.   Yes [provider]  carvedilol (COREG) 6.25 MG tablet Take 1 tablet (6.25 mg total) by mouth 2 (two) times daily with a meal. 03/02/22  Yes Mosie Lukes, MD  fish oil-omega-3 fatty acids 1000 MG capsule Take 2 g by mouth daily.   Yes [provider]  losartan (COZAAR) 25 MG tablet Take 1 tablet (25 mg total) by mouth daily. 01/12/22  Yes Mosie Lukes, MD  Multiple Vitamin (MULTIVITAMIN WITH MINERALS) TABS tablet Take 1 tablet by mouth daily.   Yes [provider]  omeprazole (PRILOSEC) 40 MG capsule Take 1 capsule (40 mg total) by mouth daily. 01/23/22  Yes Esterwood, Amy S, PA-C  rosuvastatin (CRESTOR) 10 MG tablet Take 1 tablet (10 mg total) by mouth daily. 09/06/21  Yes Mosie Lukes, MD  tiZANidine (ZANAFLEX) 2 MG tablet Take 0.5-2 tablets (1-4 mg total) by mouth 2 (two) times daily as needed for muscle spasms. 03/22/21  Yes Mosie Lukes, MD  VITAMIN D PO Take 2,500 Units by mouth daily.   Yes [provider]  Zinc 50 MG TABS Take 1 tablet by mouth daily.   Yes [provider]  fluticasone (FLONASE) 50 MCG/ACT nasal spray Place 2 sprays into both nostrils daily. Patient not taking: Reported on 03/14/2022 09/07/21 09/07/22  Caleen Jobs B, NP  influenza vaccine adjuvanted (FLUAD QUADRIVALENT) 0.5 ML injection Inject into the muscle. 03/07/22   Carlyle Basques, MD  lidocaine ([place])  5 % Place 1 patch onto the skin every 12 (twelve) hours. Remove & Discard patch within 12 hours or as directed by MD 03/07/22   Rosemarie Ax, MD  meclizine (ANTIVERT) 25 MG tablet Take 1 tablet (25 mg total) by mouth 3 (three) times daily as needed for dizziness. Patient not taking: Reported on 03/14/2022 04/25/21   Godfrey Pick, MD    Current Outpatient Medications  Medication Sig Dispense Refill   amLODipine (NORVASC) 5 MG tablet TAKE 1 TABLET(5 MG) BY MOUTH DAILY 90 tablet 1   aspirin 81 MG chewable tablet Chew by mouth daily.     carvedilol  (COREG) 6.25 MG tablet Take 1 tablet (6.25 mg total) by mouth 2 (two) times daily with a meal. 60 tablet 5   fish oil-omega-3 fatty acids 1000 MG capsule Take 2 g by mouth daily.     losartan (COZAAR) 25 MG tablet Take 1 tablet (25 mg total) by mouth daily. 90 tablet 2   Multiple Vitamin (MULTIVITAMIN WITH MINERALS) TABS tablet Take 1 tablet by mouth daily.     omeprazole (PRILOSEC) 40 MG capsule Take 1 capsule (40 mg total) by mouth daily. 90 capsule 1   rosuvastatin (CRESTOR) 10 MG tablet Take 1 tablet (10 mg total) by mouth daily. 90 tablet 3   tiZANidine (ZANAFLEX) 2 MG tablet Take 0.5-2 tablets (1-4 mg total) by mouth 2 (two) times daily as needed for muscle spasms. 30 tablet 1   VITAMIN D PO Take 2,500 Units by mouth daily.     Zinc 50 MG TABS Take 1 tablet by mouth daily.     fluticasone (FLONASE) 50 MCG/ACT nasal spray Place 2 sprays into both nostrils daily. (Patient not taking: Reported on 03/14/2022) 1 g 0   influenza vaccine adjuvanted (FLUAD QUADRIVALENT) 0.5 ML injection Inject into the muscle. 0.5 mL 0   lidocaine (LIDODERM) 5 % Place 1 patch onto the skin every 12 (twelve) hours. Remove & Discard patch within 12 hours or as directed by MD 180 each 2   meclizine (ANTIVERT) 25 MG tablet Take 1 tablet (25 mg total) by mouth 3 (three) times daily as needed for dizziness. (Patient not taking: Reported on 03/14/2022) 16 tablet 0   Current Facility-Administered Medications  Medication Dose Route Frequency Provider Last Rate Last Admin   0.9 %  sodium chloride infusion  500 mL Intravenous Once Mauri Pole, MD        Allergies as of 03/14/2022 - Review Complete 03/14/2022  Allergen Reaction Noted   Lisinopril Cough 06/17/2019   Potassium-containing compounds Rash 12/31/2019    Family History  Problem Relation Age of Onset   Stroke Mother    High blood pressure Mother    High Cholesterol Mother    Lung cancer Father    Stroke Sister        24   Thyroid disease Sister     Diabetes Sister    Hypertension Sister    Hyperlipidemia Sister    Hypertension Sister    Sleep apnea Son    Obesity Son    Colon cancer Neg Hx    Esophageal cancer Neg Hx    Stomach cancer Neg Hx     Social History   Socioeconomic History   Marital status: Married    Spouse name: [name]   Number of children: 2   Years of education: 16   Highest education level: Not on file  Occupational History   Occupation: Accountant    Comment:  retired  Tobacco Use   Smoking status: Never   Smokeless tobacco: Never  Vaping Use   Vaping Use: Never used  Substance and Sexual Activity   Alcohol use: Yes    Alcohol/week: 0.0 standard drinks of alcohol    Comment: Rarely   Drug use: No   Sexual activity: Not Currently    Comment: no dietary restrictions, lives with husand, works at [employer]  Other Topics Concern   Not on file  Social History Narrative   Lives with husband   Caffeine- tea, 2 cups daily   Social Determinants of Health   Financial Resource Strain: Not on file  Food Insecurity: Not on file  Transportation Needs: Not on file  Physical Activity: Not on file  Stress: Not on file  Social Connections: Not on file  Intimate Partner Violence: Not on file    Review of Systems:  All other review of systems negative except as mentioned in the HPI.  Physical Exam: Vital signs in last 24 hours: Blood Pressure (Abnormal) 148/48 (BP Location: Right Arm, Patient Position: Sitting, Cuff Size: Normal)   Pulse (Abnormal) 59   Temperature 98 F (36.7 C) (Temporal)   Height '5\' 2"'$  (1.575 m)   Weight 192 lb (87.1 kg)   Oxygen Saturation 96%   Body Mass Index 35.12 kg/m  General:   Alert, NAD Lungs:  Clear .   Heart:  Regular rate and rhythm Abdomen:  Soft, nontender and nondistended. Neuro/Psych:  Alert and cooperative. Normal mood and affect. A and O x 3  Reviewed labs, radiology imaging, old records and pertinent past GI work up  Patient is appropriate for planned  procedure(s) and anesthesia in an ambulatory setting   K. Denzil Magnuson , MD [phone]

## 2022-03-14 NOTE — Progress Notes (Unsigned)
0942 Robinul 0.1 mg IV given due large amount of secretions upon assessment.  MD made aware, vss

## 2022-03-14 NOTE — Progress Notes (Signed)
Called to room to assist during endoscopic procedure.  Patient ID and intended procedure confirmed with present staff. Received instructions for my participation in the procedure from the performing physician.  

## 2022-03-15 ENCOUNTER — Telehealth: Payer: Self-pay | Admitting: *Deleted

## 2022-03-15 NOTE — Telephone Encounter (Signed)
  Follow up Call-     03/14/2022    9:05 AM 03/14/2022    8:59 AM 03/25/2020    9:41 AM  Call back number  Post procedure Call Back phone  # [phone]  [phone]  Permission to leave phone message  Yes Yes     Patient questions:  Do you have a fever, pain , or abdominal swelling? No. Pain Score  0 *  Have you tolerated food without any problems? Yes.    Have you been able to return to your normal activities? Yes.    Do you have any questions about your discharge instructions: Diet   No. Medications  No. Follow up visit  No.  Do you have questions or concerns about your Care? No.  Actions: * If pain score is 4 or above: No action needed, pain <4.

## 2022-03-27 ENCOUNTER — Other Ambulatory Visit (HOSPITAL_BASED_OUTPATIENT_CLINIC_OR_DEPARTMENT_OTHER): Payer: Self-pay

## 2022-03-27 ENCOUNTER — Encounter: Payer: Self-pay | Admitting: Gastroenterology

## 2022-03-27 MED ORDER — COMIRNATY 30 MCG/0.3ML IM SUSY
PREFILLED_SYRINGE | INTRAMUSCULAR | 0 refills | Status: DC
  Filled 2022-03-27: qty 0.3, 1d supply, fill #0

## 2022-03-28 ENCOUNTER — Encounter: Payer: Medicare (Managed Care) | Admitting: Gastroenterology

## 2022-04-11 ENCOUNTER — Encounter: Payer: Self-pay | Admitting: Gastroenterology

## 2022-04-11 ENCOUNTER — Ambulatory Visit (INDEPENDENT_AMBULATORY_CARE_PROVIDER_SITE_OTHER): Payer: Medicare (Managed Care) | Admitting: Gastroenterology

## 2022-04-11 VITALS — BP 150/80 | HR 72 | Ht 62.0 in | Wt 193.0 lb

## 2022-04-11 DIAGNOSIS — K219 Gastro-esophageal reflux disease without esophagitis: Secondary | ICD-10-CM

## 2022-04-11 DIAGNOSIS — K2970 Gastritis, unspecified, without bleeding: Secondary | ICD-10-CM

## 2022-04-11 DIAGNOSIS — K317 Polyp of stomach and duodenum: Secondary | ICD-10-CM | POA: Diagnosis not present

## 2022-04-11 DIAGNOSIS — K760 Fatty (change of) liver, not elsewhere classified: Secondary | ICD-10-CM

## 2022-04-11 DIAGNOSIS — K2990 Gastroduodenitis, unspecified, without bleeding: Secondary | ICD-10-CM

## 2022-04-11 MED ORDER — SUCRALFATE 1 G PO TABS: ORAL_TABLET | ORAL | 1 refills | Status: DC

## 2022-04-11 MED ORDER — OMEPRAZOLE 40 MG PO CPDR: 40.0000 mg | DELAYED_RELEASE_CAPSULE | Freq: Every day | ORAL | 1 refills | Status: DC

## 2022-04-11 NOTE — Progress Notes (Signed)
Margaret [name]    [id number]    08-15-1950  Primary Care Physician:Blyth, Bonnita Levan, MD  Referring Physician: Mosie Lukes, MD [address] [place],  [place] [address]   Chief complaint:  Fatty liver, GERD, Dyspepsia  HPI:  71 yr very pleasant female here for follow-up visit with complaints of worsening abdominal bloating and intermittent GERD  EGD 03/14/22 - Z-line regular, 36 cm from the incisors. - No gross lesions in esophagus. - Gastritis. Biopsied.  Chronic gastritis, negative for H. pylori, intestinal metaplasia or dysplasia - Two gastric polyps. Biopsied.  Hyperplastic polyps with focal intestinal metaplasia and surface erosion - Normal examined duodenum.    GERD symptoms on and off Overall she feels GERD symptoms are well controlled but she is more bothered by the severe abdominal bloating and distention specially after meals and certain foods make it worse.  She has occasional epigastric abdominal pain Discussed biopsy results, patient wants to hold off repeat EGD for removal of gastric polyps given no evidence of malignancy or premalignancy but likely etiology for chronic GI blood loss with surface erosions  Recent imaging suggestive of fatty liver/hepatic steatosis   Denies any melena or rectal bleeding.  No vomiting   Has history of sleep apnea, usually breathes with her mouth, wakes up with dry mouth.  She is currently not using CPAP   History of H. pylori 2014 s/p treatment   Colonoscopy January 16, 2017: 10 mm adenomatous polyp removed from rectum.  5 mm sigmoid polyp removed.  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   Outpatient Encounter Medications as of 04/11/2022  Medication Sig   amLODipine (NORVASC) 5 MG tablet TAKE 1 TABLET(5 MG) BY MOUTH DAILY   aspirin 81 MG chewable tablet Chew by mouth daily.   carvedilol (COREG) 6.25 MG tablet Take 1 tablet (6.25 mg total) by mouth 2 (two) times daily with a meal.   COVID-19 mRNA vaccine 2023-2024 (COMIRNATY) syringe Inject into the muscle.   fish oil-omega-3 fatty acids 1000 MG capsule Take 2 g by mouth daily.   influenza vaccine adjuvanted (FLUAD QUADRIVALENT) 0.5 ML injection Inject into the muscle.   lidocaine (LIDODERM) 5 % Place 1 patch onto the skin every 12 (twelve) hours. Remove & Discard patch within 12 hours or as directed by MD   losartan (COZAAR) 25 MG tablet Take 1 tablet (25 mg total) by mouth daily.   Multiple Vitamin (MULTIVITAMIN WITH MINERALS) TABS tablet Take 1 tablet by mouth daily.   omeprazole (PRILOSEC) 40 MG capsule Take 1 capsule (40 mg total) by mouth daily.   rosuvastatin (CRESTOR) 10 MG tablet Take 1 tablet (10 mg total) by mouth daily.   sucralfate (CARAFATE) 1 g tablet TAKE 1 TABLET(1 GRAM) BY MOUTH FOUR TIMES DAILY AT BEDTIME  WITH MEALS   VITAMIN D PO Take 2,500 Units by mouth daily.   Zinc 50 MG TABS Take 1 tablet by mouth daily.   fluticasone (FLONASE) 50 MCG/ACT nasal spray Place 2 sprays into both nostrils daily. (Patient not taking: Reported on 03/14/2022)   meclizine (ANTIVERT) 25 MG tablet Take 1 tablet (25 mg total) by mouth 3 (three) times daily as needed for dizziness. (Patient not taking: Reported on 03/14/2022)   tiZANidine (ZANAFLEX) 2 MG tablet Take 0.5-2 tablets (1-4 mg total) by mouth 2 (two) times daily as needed for  muscle spasms. (Patient not taking: Reported on 04/11/2022)   No facility-administered encounter medications on file as of 04/11/2022.    Allergies as of 04/11/2022 - Review Complete 04/11/2022  Allergen Reaction Noted   Lisinopril Cough 06/17/2019   Potassium-containing compounds Rash 12/31/2019    Past Medical History:  Diagnosis Date   Allergic state 03/25/2012   Allergy    Aortic calcification ([place]) 08/27/2014   Atherosclerosis of aorta ([facility]) 04/12/2014   Back pain    Constipation 01/23/2017   GERD (gastroesophageal reflux disease)    Hiatal hernia with gastroesophageal reflux 04/27/2010   Qualifier: Diagnosis of  By: Danelle Earthly CMA, Darlene  Failed Omeprazole and Protonix    Hx of adenomatous polyp of colon 01/23/2017   Colonoscopy 01/16/2017 with 2 polyps one was adenomatous. recommeded follow up in 5 years.performed by Dr Silverio Decamp   Hyperlipidemia    Hypertension    Joint pain    Osteopenia 08/31/2012   Peripheral edema 12/13/2014   Sleep apnea    Unspecified sinusitis (chronic) 03/30/2013    Past Surgical History:  Procedure Laterality Date   24 HOUR [place] STUDY N/A 12/15/2015   Procedure: 24 HOUR PH STUDY;  Surgeon: Mauri Pole, MD;  Location: [facility];  Service: Endoscopy;  Laterality: N/A;   DILATION AND CURETTAGE OF UTERUS     ESOPHAGEAL MANOMETRY N/A 12/15/2015   Procedure: ESOPHAGEAL MANOMETRY (EM);  Surgeon: Mauri Pole, MD;  [facility]: [facility];  Service: Endoscopy;  Laterality: N/A;    Family History  Problem Relation Age of Onset   Stroke Mother    High blood pressure Mother    High Cholesterol Mother    Lung cancer Father    Stroke Sister        43   Thyroid disease Sister    Diabetes Sister    Hypertension Sister    Hyperlipidemia Sister    Hypertension Sister    Sleep apnea Son    Obesity Son    Colon cancer Neg Hx    Esophageal cancer Neg Hx    Stomach cancer Neg Hx     Social History   Socioeconomic History   Marital status:  Married    Spouse name: [name]   Number of children: 2   Years of education: 56   Highest education level: Not on file  Occupational History   Occupation: Optometrist    Comment: retired  Tobacco Use   Smoking status: Never   Smokeless tobacco: Never  Vaping Use   Vaping Use: Never used  Substance and Sexual Activity   Alcohol use: Yes    Alcohol/week: 0.0 standard drinks of alcohol    Comment: Rarely   Drug use: No   Sexual activity: Not Currently    Comment: no dietary restrictions, lives with husand, works at [employer]  Other Topics Concern   Not on file  Social History Narrative   Lives with husband  Caffeine- tea, 2 cups daily   Social Determinants of Health   Financial Resource Strain: Not on file  Food Insecurity: Not on file  Transportation Needs: Not on file  Physical Activity: Not on file  Stress: Not on file  Social Connections: Not on file  Intimate Partner Violence: Not on file      Review of systems: All other review of systems negative except as mentioned in the HPI.   Physical Exam: Vitals:   04/11/22 1401  BP: (Abnormal) 150/80  Pulse: 72   Body mass index is 35.3 kg/m. Gen:      No acute distress HEENT:  sclera anicteric Abd:      soft, non-tender; no palpable masses, no distension Ext:    No edema Neuro: alert and oriented x 3 Psych: normal mood and affect  Data Reviewed:  Reviewed labs, radiology imaging, old records and pertinent past GI work up   Assessment and Plan/Recommendations:  71 year old very pleasant female with history of chronic GERD, chronic irritable bowel syndrome and abdominal bloating, with positive fecal Hemoccult in the setting of chronic GI blood loss with erosive gastritis and inflammatory polyps with surface erosions  Patient wants to hold off repeat EGD for removal of gastric polyps, will need to be scheduled at the hospital given increased risk for bleeding , Continue to monitor Continue omeprazole 40 mg daily,  30 minutes before breakfast Carafate before meals and at bedtime as needed Antireflux measures  She is s/p H. pylori treatment with confirmed eradication     History of large adenomatous colon polyps, due for surveillance colonoscopy in November 2026  Steatohepatitis: Avoid simple sugars and carbohydrates Increase dietary fiber Avoid alcohol or over-the-counter herbal remedies that are hepatotoxins Continue to monitor LFT    Return in 3 months or sooner if needed   The patient was provided an opportunity to ask questions and all were answered. The patient agreed with the plan and demonstrated an understanding of the instructions.  Damaris Hippo , MD    CC: Mosie Lukes, MD

## 2022-04-11 NOTE — Patient Instructions (Signed)
We have sent the following medications to your pharmacy for you to pick up at your convenience: Omeprazole, Carafate   Avoid sugars as much as possible.   Follow -up in  3 months.   _______________________________________________________  If you are age 69 or older, your body mass index should be between 23-30. Your Body mass index is 35.3 kg/m. If this is out of the aforementioned range listed, please consider follow up with your Primary Care Provider.  If you are age 58 or younger, your body mass index should be between 19-25. Your Body mass index is 35.3 kg/m. If this is out of the aformentioned range listed, please consider follow up with your Primary Care Provider.   _____________________________________________________  The [facility] providers would like to encourage you to use [facility] to communicate with providers for non-urgent requests or questions.  Due to long hold times on the telephone, sending your provider a message by [facility] may be a faster and more efficient way to get a response.  Please allow 48 business hours for a response.  Please remember that this is for non-urgent requests.   Thank you for choosing me and [facility].

## 2022-04-13 ENCOUNTER — Other Ambulatory Visit (HOSPITAL_BASED_OUTPATIENT_CLINIC_OR_DEPARTMENT_OTHER): Payer: Self-pay

## 2022-04-13 MED ORDER — AREXVY 120 MCG/0.5ML IM SUSR
INTRAMUSCULAR | 0 refills | Status: DC
  Filled 2022-04-13: qty 1, 1d supply, fill #0

## 2022-04-24 ENCOUNTER — Encounter: Payer: Self-pay | Admitting: Gastroenterology

## 2022-04-25 ENCOUNTER — Encounter: Payer: Self-pay | Admitting: Family

## 2022-04-25 ENCOUNTER — Ambulatory Visit (INDEPENDENT_AMBULATORY_CARE_PROVIDER_SITE_OTHER): Payer: Medicare (Managed Care) | Admitting: Family

## 2022-04-25 ENCOUNTER — Encounter: Payer: Self-pay | Admitting: Family Medicine

## 2022-04-25 VITALS — BP 127/59 | HR 88 | Temp 98.2°F | Resp 16 | Ht 62.0 in | Wt 193.0 lb

## 2022-04-25 DIAGNOSIS — M25561 Pain in right knee: Secondary | ICD-10-CM | POA: Insufficient documentation

## 2022-04-25 DIAGNOSIS — R739 Hyperglycemia, unspecified: Secondary | ICD-10-CM | POA: Diagnosis not present

## 2022-04-25 DIAGNOSIS — I10 Essential (primary) hypertension: Secondary | ICD-10-CM

## 2022-04-25 DIAGNOSIS — M47816 Spondylosis without myelopathy or radiculopathy, lumbar region: Secondary | ICD-10-CM

## 2022-04-25 DIAGNOSIS — M545 Low back pain, unspecified: Secondary | ICD-10-CM

## 2022-04-25 DIAGNOSIS — M25562 Pain in left knee: Secondary | ICD-10-CM

## 2022-04-25 DIAGNOSIS — R195 Other fecal abnormalities: Secondary | ICD-10-CM | POA: Insufficient documentation

## 2022-04-25 MED ORDER — CARVEDILOL 6.25 MG PO TABS
6.2500 mg | ORAL_TABLET | Freq: Two times a day (BID) | ORAL | 0 refills | Status: DC
Start: 2022-04-25 — End: 2022-08-07

## 2022-04-25 MED ORDER — AMLODIPINE BESYLATE 5 MG PO TABS
ORAL_TABLET | ORAL | 0 refills | Status: DC
Start: 2022-04-25 — End: 2022-06-15

## 2022-04-25 MED ORDER — OMEPRAZOLE 40 MG PO CPDR
40.0000 mg | DELAYED_RELEASE_CAPSULE | Freq: Every day | ORAL | 0 refills | Status: DC
Start: 2022-04-25 — End: 2023-12-17

## 2022-04-25 NOTE — Assessment & Plan Note (Signed)
Suspect OA, recommended oral tylenol prn and topical voltaren gel prn.

## 2022-04-25 NOTE — Assessment & Plan Note (Signed)
Recent endoscopy showed gastritis. Maintained on omeprazole.

## 2022-04-25 NOTE — Assessment & Plan Note (Signed)
BP Readings from Last 3 Encounters:  04/25/22 (!) 127/59  04/11/22 (!) 150/80  03/14/22 (!) 129/57   BP stable.  I do not think that her knee stiffness is due to amlodipine.  She really does not have much swelling in her ankles.  Continue current dose of losartan and amlodipine.

## 2022-04-25 NOTE — Progress Notes (Addendum)
Subjective:   By signing my name below, I, Margaret [name], attest that this documentation has been prepared under the direction and in the presence of [name], NP 04/25/2022   Patient ID: Margaret [name], adult    DOB: 1951-02-28, 71 y.o.   MRN: [id number]  Chief Complaint  Patient presents with   Hypertension    Here for follow up    Back Pain    Complains of back pain   Leg Swelling    Complains of swelling on both legs    HPI Patient is in today for an office visit  Blood Test: She reports that she is supposed to receive some blood test.   Leg Swelling: She states that since starting her 25 mg of Losartan medication, she has been experiencing lower extremity swelling. She states that swelling is painful in the morning and pain is mostly central in her knees. She states her knees are swollen to a point where it's heavy. She also states that symptoms are manageable. She notes of some tenderness behind her left calf.   Back Pain: She was previously seen by a sports medicine specialist for her symptoms but would prefer to see an osteoarthritis specialist. Because of back pain she has to frequently tylenol. She notes of previously founded blood in her stool so she was seen by a GI, had an endoscopy as a result.   Blood Pressure: She is currently taking 6.25 mg of Carvedilol and 25 mg of Losartan BP Readings from Last 3 Encounters:  04/25/22 (!) 127/59  04/11/22 (!) 150/80  03/14/22 (!) 129/57   Pulse Readings from Last 3 Encounters:  04/25/22 88  04/11/22 72  03/14/22 60   Reflux: She is taking 40 mg of Omeprazole PRN.   Cholesterol: She is currently taking 10 mg of Crestor. She denies of any known significant side effects Lab Results  Component Value Date   CHOL 188 01/12/2022   HDL 57.40 01/12/2022   LDLCALC 100 (H) 01/12/2022   TRIG 151.0 (H) 01/12/2022   CHOLHDL 3 01/12/2022   Immunizations: She is currently UTD on the influenza, Covid, RSV.    Travel: She is traveling next year and is requesting a 3 month supply of her medications for the 3 months that she is away.  Health Maintenance Due  Topic Date Due   Medicare Annual Wellness (AWV)  Never done   Zoster Vaccines- Shingrix (1 of 2) Never done   Pneumonia Vaccine 39+ Years old (2 - PPSV23 or PCV20) 01/11/2022   COVID-19 Vaccine (4 - 2023-24 season) 01/27/2022    Past Medical History:  Diagnosis Date   Allergic state 03/25/2012   Allergy    Aortic calcification ([facility]) 08/27/2014   Atherosclerosis of aorta ([place]) 04/12/2014   Back pain    Constipation 01/23/2017   COVID-19 02/21/2021   GERD (gastroesophageal reflux disease)    Hiatal hernia with gastroesophageal reflux 04/27/2010   Qualifier: Diagnosis of  By: Danelle Earthly CMA, Darlene  Failed Omeprazole and Protonix    Hx of adenomatous polyp of colon 01/23/2017   Colonoscopy 01/16/2017 with 2 polyps one was adenomatous. recommeded follow up in 5 years.performed by Dr Silverio Decamp   Hyperlipidemia    Hypertension    Joint pain    Osteopenia 08/31/2012   Peripheral edema 12/13/2014   Sleep apnea    Unspecified sinusitis (chronic) 03/30/2013    Past Surgical History:  Procedure Laterality Date   24 HOUR [place] STUDY N/A 12/15/2015   Procedure:  [place];  Surgeon: Mauri Pole, MD;  Location: [facility];  Service: Endoscopy;  Laterality: N/A;   DILATION AND CURETTAGE OF UTERUS     ESOPHAGEAL MANOMETRY N/A 12/15/2015   Procedure: ESOPHAGEAL MANOMETRY (EM);  Surgeon: Mauri Pole, MD;  Location: [facility];  Service: Endoscopy;  Laterality: N/A;    Family History  Problem Relation Age of Onset   Stroke Mother    High blood pressure Mother    High Cholesterol Mother    Lung cancer Father    Stroke Sister        41   Thyroid disease Sister    Diabetes Sister    Hypertension Sister    Hyperlipidemia Sister    Hypertension Sister    Sleep apnea Son    Obesity Son    Colon cancer Neg Hx     Esophageal cancer Neg Hx    Stomach cancer Neg Hx     Social History   Socioeconomic History   Marital status: Married    Spouse name: Margaret [name]   Number of children: 2   Years of education: 26   Highest education level: Not on file  Occupational History   Occupation: Optometrist    Comment: retired  Tobacco Use   Smoking status: Never   Smokeless tobacco: Never  Vaping Use   Vaping Use: Never used  Substance and Sexual Activity   Alcohol use: Yes    Alcohol/week: 0.0 standard drinks of alcohol    Comment: Rarely   Drug use: No   Sexual activity: Not Currently    Comment: no dietary restrictions, lives with husand, works at [employer]  Other Topics Concern   Not on file  Social History Narrative   Lives with husband   Caffeine- tea, 2 cups daily   Social Determinants of Health   Financial Resource Strain: Not on file  Food Insecurity: Not on file  Transportation Needs: Not on file  Physical Activity: Not on file  Stress: Not on file  Social Connections: Not on file  Intimate Partner Violence: Not on file    Outpatient Medications Prior to Visit  Medication Sig Dispense Refill   amLODipine (NORVASC) 5 MG tablet TAKE 1 TABLET(5 MG) BY MOUTH DAILY 90 tablet 1   aspirin 81 MG chewable tablet Chew by mouth daily.     carvedilol (COREG) 6.25 MG tablet Take 1 tablet (6.25 mg total) by mouth 2 (two) times daily with a meal. 60 tablet 5   fish oil-omega-3 fatty acids 1000 MG capsule Take 2 g by mouth daily.     fluticasone (FLONASE) 50 MCG/ACT nasal spray Place 2 sprays into both nostrils daily. (Patient not taking: Reported on 03/14/2022) 1 g 0   lidocaine (LIDODERM) 5 % Place 1 patch onto the skin every 12 (twelve) hours. Remove & Discard patch within 12 hours or as directed by MD 180 each 2   losartan (COZAAR) 25 MG tablet Take 1 tablet (25 mg total) by mouth daily. 90 tablet 2   Multiple Vitamin (MULTIVITAMIN WITH MINERALS) TABS tablet Take 1 tablet by mouth daily.     omeprazole  (PRILOSEC) 40 MG capsule Take 1 capsule (40 mg total) by mouth daily. 90 capsule 1   rosuvastatin (CRESTOR) 10 MG tablet Take 1 tablet (10 mg total) by mouth daily. 90 tablet 3   sucralfate (CARAFATE) 1 g tablet TAKE 1 TABLET(1 GRAM) BY MOUTH FOUR TIMES DAILY AT BEDTIME WITH MEALS 368 tablet 1  VITAMIN D PO Take 2,500 Units by mouth daily.     Zinc 50 MG TABS Take 1 tablet by mouth daily.     COVID-19 mRNA vaccine 2023-2024 (COMIRNATY) syringe Inject into the muscle. 0.3 mL 0   influenza vaccine adjuvanted (FLUAD QUADRIVALENT) 0.5 ML injection Inject into the muscle. 0.5 mL 0   meclizine (ANTIVERT) 25 MG tablet Take 1 tablet (25 mg total) by mouth 3 (three) times daily as needed for dizziness. (Patient not taking: Reported on 03/14/2022) 16 tablet 0   RSV vaccine recomb adjuvanted (AREXVY) 120 MCG/0.5ML injection Inject into the muscle. 1 each 0   tiZANidine (ZANAFLEX) 2 MG tablet Take 0.5-2 tablets (1-4 mg total) by mouth 2 (two) times daily as needed for muscle spasms. (Patient not taking: Reported on 04/11/2022) 30 tablet 1   No facility-administered medications prior to visit.    Allergies  Allergen Reactions   Lisinopril Cough   Potassium-Containing Compounds Rash    ROS    See HPI Objective:    Physical Exam Constitutional:      General: She is not in acute distress.    Appearance: Normal appearance. She is not ill-appearing.  HENT:     Head: Normocephalic and atraumatic.     Right Ear: External ear normal.     Left Ear: External ear normal.  Eyes:     Extraocular Movements: Extraocular movements intact.     Pupils: Pupils are equal, round, and reactive to light.  Neck:     Thyroid: No thyromegaly.  Cardiovascular:     Rate and Rhythm: Normal rate and regular rhythm.     Heart sounds: Normal heart sounds. No murmur heard.    No gallop.  Pulmonary:     Effort: Pulmonary effort is normal. No respiratory distress.     Breath sounds: Normal breath sounds. No wheezing or  rales.  Musculoskeletal:     Right lower leg: 1+ Edema present.     Left lower leg: 1+ Edema present.  Lymphadenopathy:     Cervical: No cervical adenopathy.  Skin:    General: Skin is warm and dry.  Neurological:     Mental Status: She is alert and oriented to person, place, and time.  Psychiatric:        Mood and Affect: Mood normal.        Behavior: Behavior normal.        Judgment: Judgment normal.     BP (!) 127/59 (BP Location: Right Arm, Patient Position: Sitting, Cuff Size: Large)   Pulse 88   Temp 98.2 F (36.8 C) (Oral)   Resp 16   Ht '5\' 2"'$  (1.575 m)   Wt 193 lb (87.5 kg)   SpO2 96%   BMI 35.30 kg/m  Wt Readings from Last 3 Encounters:  04/25/22 193 lb (87.5 kg)  04/11/22 193 lb (87.5 kg)  03/14/22 192 lb (87.1 kg)       Assessment & Plan:   Problem List Items Addressed This Visit       Unprioritized   Pain in both knees    Suspect OA, recommended oral tylenol prn and topical voltaren gel prn.       Relevant Orders   Ambulatory referral to Orthopedics   Hyperglycemia - Primary   Relevant Orders   Hemoglobin A1c   Comprehensive metabolic panel   CBC   Heme positive stool    Recent endoscopy showed gastritis. Maintained on omeprazole.       Relevant Orders  CBC   Facet arthritis of lumbar region    Pt is requesting referral to orthopedics for back pain as well as knee pain.       Relevant Orders   Ambulatory referral to Orthopedics   Essential hypertension    BP Readings from Last 3 Encounters:  04/25/22 (!) 127/59  04/11/22 (!) 150/80  03/14/22 (!) 129/57  BP stable.  I do not think that her knee stiffness is due to amlodipine.  She really does not have much swelling in her ankles.  Continue current dose of losartan and amlodipine.      Back pain   No orders of the defined types were placed in this encounter.   I, Nance Pear, NP, personally preformed the services described in this documentation.  All medical record  entries made by the scribe were at my direction and in my presence.  I have reviewed the chart and discharge instructions (if applicable) and agree that the record reflects my personal performance and is accurate and complete. 04/25/2022   I,Amber Collins,acting as a scribe for Nance Pear, NP.,have documented all relevant documentation on the behalf of Nance Pear, NP,as directed by  Nance Pear, NP while in the presence of Nance Pear, NP.    Nance Pear, NP

## 2022-04-25 NOTE — Assessment & Plan Note (Signed)
Pt is requesting referral to orthopedics for back pain as well as knee pain.

## 2022-04-26 ENCOUNTER — Telehealth (HOSPITAL_BASED_OUTPATIENT_CLINIC_OR_DEPARTMENT_OTHER): Payer: Self-pay

## 2022-04-26 NOTE — Telephone Encounter (Signed)
ATC to get appt scheduled with Elsworth Soho

## 2022-04-27 ENCOUNTER — Other Ambulatory Visit (INDEPENDENT_AMBULATORY_CARE_PROVIDER_SITE_OTHER): Payer: Medicare (Managed Care)

## 2022-04-27 DIAGNOSIS — R195 Other fecal abnormalities: Secondary | ICD-10-CM

## 2022-04-27 DIAGNOSIS — R739 Hyperglycemia, unspecified: Secondary | ICD-10-CM | POA: Diagnosis not present

## 2022-04-27 LAB — COMPREHENSIVE METABOLIC PANEL
ALT: 47 U/L — ABNORMAL HIGH (ref 0–35)
AST: 33 U/L (ref 0–37)
Albumin: 4.2 g/dL (ref 3.5–5.2)
Alkaline Phosphatase: 81 U/L (ref 39–117)
BUN: 21 mg/dL (ref 6–23)
CO2: 29 mEq/L (ref 19–32)
Calcium: 9.2 mg/dL (ref 8.4–10.5)
Chloride: 102 mEq/L (ref 96–112)
Creatinine, Ser: 0.72 mg/dL (ref 0.40–1.20)
GFR: 85.59 mL/min (ref >60.00)
Glucose, Bld: 105 mg/dL — ABNORMAL HIGH (ref 70–99)
Potassium: 4.3 mEq/L (ref 3.5–5.1)
Sodium: 138 mEq/L (ref 135–145)
Total Bilirubin: 0.4 mg/dL (ref 0.2–1.2)
Total Protein: 7 g/dL (ref 6.0–8.3)

## 2022-04-27 LAB — CBC
HCT: 39.5 % (ref 36.0–46.0)
Hemoglobin: 13.1 g/dL (ref 12.0–15.0)
MCHC: 33.1 g/dL (ref 30.0–36.0)
MCV: 89.4 fl (ref 78.0–100.0)
Platelets: 229 10*3/uL (ref 150.0–400.0)
RBC: 4.41 Mil/uL (ref 3.87–5.11)
RDW: 13.6 % (ref 11.5–15.5)
WBC: 6.1 10*3/uL (ref 4.0–10.5)

## 2022-04-27 LAB — HEMOGLOBIN A1C: Hgb A1c MFr Bld: 6.5 % (ref 4.6–6.5)

## 2022-04-27 NOTE — Addendum Note (Signed)
Addended by: Kelle Darting A on: 04/27/2022 10:06 AM   Modules accepted: Orders

## 2022-05-02 ENCOUNTER — Ambulatory Visit: Payer: Medicare (Managed Care) | Admitting: Family Medicine

## 2022-06-15 ENCOUNTER — Other Ambulatory Visit: Payer: Self-pay

## 2022-06-15 ENCOUNTER — Telehealth: Payer: Self-pay | Admitting: Family Medicine

## 2022-06-15 MED ORDER — AMLODIPINE BESYLATE 5 MG PO TABS: ORAL_TABLET | ORAL | 3 refills | Status: DC

## 2022-06-15 NOTE — Telephone Encounter (Signed)
Refill was sent

## 2022-06-15 NOTE — Telephone Encounter (Signed)
Prescription Request  06/15/2022  Is this a "Controlled Substance" medicine? No  LOV: 01/12/2022  What is the name of the medication or equipment? amLODipine (NORVASC) 5 MG tablet   Have you contacted your pharmacy to request a refill? Yes  Which pharmacy would you like this sent to?  [id number] - [place], [place] - [address] [place] [address] Phone: [phone] Fax: [phone]   Patient notified that their request is being sent to the clinical staff for review and that they should receive a response within 2 business days.   Please advise at Mobile [phone] (mobile)

## 2022-06-30 ENCOUNTER — Ambulatory Visit: Payer: Medicare (Managed Care)

## 2022-07-02 DIAGNOSIS — G4733 Obstructive sleep apnea (adult) (pediatric): Secondary | ICD-10-CM | POA: Diagnosis not present

## 2022-07-07 ENCOUNTER — Ambulatory Visit: Payer: Medicare (Managed Care)

## 2022-08-01 ENCOUNTER — Ambulatory Visit (INDEPENDENT_AMBULATORY_CARE_PROVIDER_SITE_OTHER): Payer: Medicare (Managed Care) | Admitting: *Deleted

## 2022-08-01 DIAGNOSIS — Z0000 Encounter for general adult medical examination without abnormal findings: Secondary | ICD-10-CM

## 2022-08-01 NOTE — Patient Instructions (Signed)
Margaret [name] , Thank you for taking time to come for your Medicare Wellness Visit. I appreciate your ongoing commitment to your health goals. Please review the following plan we discussed and let me know if I can assist you in the future.   These are the goals we discussed:  Goals   None     This is a list of the screening recommended for you and due dates:  Health Maintenance  Topic Date Due   Zoster (Shingles) Vaccine (1 of 2) Never done   Pneumonia Vaccine (2 of 2 - PPSV23 or PCV20) 01/11/2022   DTaP/Tdap/Td vaccine (2 - Td or Tdap) 05/29/2022   Medicare Annual Wellness Visit  08/01/2023   Mammogram  01/18/2024   Colon Cancer Screening  03/25/2025   Flu Shot  Completed   DEXA scan (bone density measurement)  Completed   COVID-19 Vaccine  Completed   Hepatitis C Screening: USPSTF Recommendation to screen - Ages 4-79 yo.  Completed   HPV Vaccine  Aged Out     Next appointment: Follow up in one year for your annual wellness visit.   Preventive Care 61 Years and Older, Female Preventive care refers to lifestyle choices and visits with your health care provider that can promote health and wellness. What does preventive care include? A yearly physical exam. This is also called an annual well check. Dental exams once or twice a year. Routine eye exams. Ask your health care provider how often you should have your eyes checked. Personal lifestyle choices, including: Daily care of your teeth and gums. Regular physical activity. Eating a healthy diet. Avoiding tobacco and drug use. Limiting alcohol use. Practicing safe sex. Taking low-dose aspirin every day. Taking vitamin and mineral supplements as recommended by your health care provider. What happens during an annual well check? The services and screenings done by your health care provider during your annual well check will depend on your age, overall health, lifestyle risk factors, and family history of disease. Counseling   Your health care provider may ask you questions about your: Alcohol use. Tobacco use. Drug use. Emotional well-being. Home and relationship well-being. Sexual activity. Eating habits. History of falls. Memory and ability to understand (cognition). Work and work Statistician. Reproductive health. Screening  You may have the following tests or measurements: Height, weight, and BMI. Blood pressure. Lipid and cholesterol levels. These may be checked every 5 years, or more frequently if you are over 1 years old. Skin check. Lung cancer screening. You may have this screening every year starting at age 72 if you have a 30-pack-year history of smoking and currently smoke or have quit within the past 15 years. Fecal occult blood test (FOBT) of the stool. You may have this test every year starting at age 36. Flexible sigmoidoscopy or colonoscopy. You may have a sigmoidoscopy every 5 years or a colonoscopy every 10 years starting at age 2. Hepatitis C blood test. Hepatitis B blood test. Sexually transmitted disease (STD) testing. Diabetes screening. This is done by checking your blood sugar (glucose) after you have not eaten for a while (fasting). You may have this done every 1-3 years. Bone density scan. This is done to screen for osteoporosis. You may have this done starting at age 50. Mammogram. This may be done every 1-2 years. Talk to your health care provider about how often you should have regular mammograms. Talk with your health care provider about your test results, treatment options, and if necessary, the need for more  tests. Vaccines  Your health care provider may recommend certain vaccines, such as: Influenza vaccine. This is recommended every year. Tetanus, diphtheria, and acellular pertussis (Tdap, Td) vaccine. You may need a Td booster every 10 years. Zoster vaccine. You may need this after age 81. Pneumococcal 13-valent conjugate (PCV13) vaccine. One dose is recommended  after age 106. Pneumococcal polysaccharide (PPSV23) vaccine. One dose is recommended after age 78. Talk to your health care provider about which screenings and vaccines you need and how often you need them. This information is not intended to replace advice given to you by your health care provider. Make sure you discuss any questions you have with your health care provider. Document Released: 06/11/2015 Document Revised: 02/02/2016 Document Reviewed: 03/16/2015 Elsevier Interactive Patient Education  2017 [employer] Prevention in the Home Falls can cause injuries. They can happen to people of all ages. There are many things you can do to make your home safe and to help prevent falls. What can I do on the outside of my home? Regularly fix the edges of walkways and driveways and fix any cracks. Remove anything that might make you trip as you walk through a door, such as a raised step or threshold. Trim any bushes or trees on the path to your home. Use bright outdoor lighting. Clear any walking paths of anything that might make someone trip, such as rocks or tools. Regularly check to see if handrails are loose or broken. Make sure that both sides of any steps have handrails. Any raised decks and porches should have guardrails on the edges. Have any leaves, snow, or ice cleared regularly. Use sand or salt on walking paths during winter. Clean up any spills in your garage right away. This includes oil or grease spills. What can I do in the bathroom? Use night lights. Install grab bars by the toilet and in the tub and shower. Do not use towel bars as grab bars. Use non-skid mats or decals in the tub or shower. If you need to sit down in the shower, use a plastic, non-slip stool. Keep the floor dry. Clean up any water that spills on the floor as soon as it happens. Remove soap buildup in the tub or shower regularly. Attach bath mats securely with double-sided non-slip rug tape. Do not  have throw rugs and other things on the floor that can make you trip. What can I do in the bedroom? Use night lights. Make sure that you have a light by your bed that is easy to reach. Do not use any sheets or blankets that are too big for your bed. They should not hang down onto the floor. Have a firm chair that has side arms. You can use this for support while you get dressed. Do not have throw rugs and other things on the floor that can make you trip. What can I do in the kitchen? Clean up any spills right away. Avoid walking on wet floors. Keep items that you use a lot in easy-to-reach places. If you need to reach something above you, use a strong step stool that has a grab bar. Keep electrical cords out of the way. Do not use floor polish or wax that makes floors slippery. If you must use wax, use non-skid floor wax. Do not have throw rugs and other things on the floor that can make you trip. What can I do with my stairs? Do not leave any items on the stairs. Make sure  that there are handrails on both sides of the stairs and use them. Fix handrails that are broken or loose. Make sure that handrails are as long as the stairways. Check any carpeting to make sure that it is firmly attached to the stairs. Fix any carpet that is loose or worn. Avoid having throw rugs at the top or bottom of the stairs. If you do have throw rugs, attach them to the floor with carpet tape. Make sure that you have a light switch at the top of the stairs and the bottom of the stairs. If you do not have them, ask someone to add them for you. What else can I do to help prevent falls? Wear shoes that: Do not have high heels. Have rubber bottoms. Are comfortable and fit you well. Are closed at the toe. Do not wear sandals. If you use a stepladder: Make sure that it is fully opened. Do not climb a closed stepladder. Make sure that both sides of the stepladder are locked into place. Ask someone to hold it for  you, if possible. Clearly mark and make sure that you can see: Any grab bars or handrails. First and last steps. Where the edge of each step is. Use tools that help you move around (mobility aids) if they are needed. These include: Canes. Walkers. Scooters. Crutches. Turn on the lights when you go into a dark area. Replace any light bulbs as soon as they burn out. Set up your furniture so you have a clear path. Avoid moving your furniture around. If any of your floors are uneven, fix them. If there are any pets around you, be aware of where they are. Review your medicines with your doctor. Some medicines can make you feel dizzy. This can increase your chance of falling. Ask your doctor what other things that you can do to help prevent falls. This information is not intended to replace advice given to you by your health care provider. Make sure you discuss any questions you have with your health care provider. Document Released: 03/11/2009 Document Revised: 10/21/2015 Document Reviewed: 06/19/2014 Elsevier Interactive Patient Education  2017 [employer].

## 2022-08-01 NOTE — Progress Notes (Signed)
Subjective:   Margaret [name] is a 72 y.o. female who presents for Medicare Annual (Subsequent) preventive examination.  I connected with  [name] on 08/01/22 by a audio enabled telemedicine application and verified that I am speaking with the correct person using two identifiers.  Patient Location: Home  Provider Location: Office/Clinic  I discussed the limitations of evaluation and management by telemedicine. The patient expressed understanding and agreed to proceed.   Review of Systems     Cardiac Risk Factors include: advanced age (>75mn, >>72women);dyslipidemia;obesity (BMI >30kg/m2);hypertension     Objective:    There were no vitals filed for this visit. There is no height or weight on file to calculate BMI.     08/01/2022    3:32 PM 05/19/2021    2:08 PM 04/25/2021    7:22 AM 11/27/2017    3:16 PM 01/16/2017   10:05 AM 01/16/2017   10:04 AM 09/06/2016    4:00 AM  Advanced Directives  Does Patient Have a Medical Advance Directive? Yes Yes Yes No  Yes Yes  Type of AParamedicof [place]Living will [place]Living will [place]Living will  HHeart [place]Living will  Living will  Does patient want to make changes to medical advance directive? No - Patient declined No - Patient declined No - Patient declined    No - Patient declined  Copy of [place] Chart? No - copy requested No - copy requested No - copy requested      Would patient like information on creating a medical advance directive?   No - Patient declined    No - Patient declined    Current Medications (verified) Outpatient Encounter Medications as of 08/01/2022  Medication Sig   amLODipine (NORVASC) 5 MG tablet TAKE 1 TABLET(5 MG) BY MOUTH DAILY   aspirin 81 MG chewable tablet Chew by mouth daily.   carvedilol (COREG) 6.25 MG tablet Take 1 tablet (6.25 mg total) by mouth 2 (two) times daily with a meal.    fish oil-omega-3 fatty acids 1000 MG capsule Take 2 g by mouth daily.   lidocaine (LIDODERM) 5 % Place 1 patch onto the skin every 12 (twelve) hours. Remove & Discard patch within 12 hours or as directed by MD   losartan (COZAAR) 25 MG tablet Take 1 tablet (25 mg total) by mouth daily.   Multiple Vitamin (MULTIVITAMIN WITH MINERALS) TABS tablet Take 1 tablet by mouth daily.   omeprazole (PRILOSEC) 40 MG capsule Take 1 capsule (40 mg total) by mouth daily.   rosuvastatin (CRESTOR) 10 MG tablet Take 1 tablet (10 mg total) by mouth daily.   sucralfate (CARAFATE) 1 g tablet TAKE 1 TABLET(1 GRAM) BY MOUTH FOUR TIMES DAILY AT BEDTIME WITH MEALS   VITAMIN D PO Take 2,500 Units by mouth daily.   Zinc 50 MG TABS Take 1 tablet by mouth daily.   No facility-administered encounter medications on file as of 08/01/2022.    Allergies (verified) Lisinopril and Potassium-containing compounds   History: Past Medical History:  Diagnosis Date   Allergic state 03/25/2012   Allergy    Aortic calcification ([place] 08/27/2014   Atherosclerosis of aorta (HCC) 04/12/2014   Back pain    Constipation 01/23/2017   COVID-19 02/21/2021   GERD (gastroesophageal reflux disease)    Hiatal hernia with gastroesophageal reflux 04/27/2010   Qualifier: Diagnosis of  By: KDanelle EarthlyCMA, Darlene  Failed Omeprazole and Protonix  Hx of adenomatous polyp of colon 01/23/2017   Colonoscopy 01/16/2017 with 2 polyps one was adenomatous. recommeded follow up in 5 years.performed by Dr Silverio Decamp   Hyperlipidemia    Hypertension    Joint pain    Osteopenia 08/31/2012   Peripheral edema 12/13/2014   Sleep apnea    Unspecified sinusitis (chronic) 03/30/2013   Past Surgical History:  Procedure Laterality Date   24 HOUR [place] STUDY N/A 12/15/2015   Procedure: 24 HOUR PH STUDY;  Surgeon: Mauri Pole, MD;  Location: [facility];  Service: Endoscopy;  Laterality: N/A;   DILATION AND CURETTAGE OF UTERUS     ESOPHAGEAL MANOMETRY N/A  12/15/2015   Procedure: ESOPHAGEAL MANOMETRY (EM);  Surgeon: Mauri Pole, MD;  Location: [facility];  Service: Endoscopy;  Laterality: N/A;   Family History  Problem Relation Age of Onset   Stroke Mother    High blood pressure Mother    High Cholesterol Mother    Lung cancer Father    Stroke Sister        25   Thyroid disease Sister    Diabetes Sister    Hypertension Sister    Hyperlipidemia Sister    Hypertension Sister    Sleep apnea Son    Obesity Son    Colon cancer Neg Hx    Esophageal cancer Neg Hx    Stomach cancer Neg Hx    Social History   Socioeconomic History   Marital status: Married    Spouse name: [name]   Number of children: 2   Years of education: 47   Highest education level: Not on file  Occupational History   Occupation: Optometrist    Comment: retired  Tobacco Use   Smoking status: Never   Smokeless tobacco: Never  Vaping Use   Vaping Use: Never used  Substance and Sexual Activity   Alcohol use: Yes    Alcohol/week: 0.0 standard drinks of alcohol    Comment: Rarely   Drug use: No   Sexual activity: Not Currently    Comment: no dietary restrictions, lives with husand, works at [employer]  Other Topics Concern   Not on file  Social History Narrative   Lives with husband   Caffeine- tea, 2 cups daily   Social Determinants of Health   Financial Resource Strain: Low Risk  (08/01/2022)   Overall Financial Resource Strain (CARDIA)    Difficulty of Paying Living Expenses: Not hard at all  Food Insecurity: No Food Insecurity (08/01/2022)   Hunger Vital Sign    Worried About Running Out of Food in the Last Year: Never true    [place] in the Last Year: Never true  Transportation Needs: No Transportation Needs (08/01/2022)   PRAPARE - Hydrologist (Medical): No    Lack of Transportation (Non-Medical): No  Physical Activity: Inactive (08/01/2022)   Exercise Vital Sign    Days of Exercise per Week: 0 days    Minutes  of Exercise per Session: 0 min  Stress: No Stress Concern Present (08/01/2022)   [place]    Feeling of Stress : Not at all  Social Connections: [place] (08/01/2022)   Social Connection and Isolation Panel [NHANES]    Frequency of Communication with Friends and Family: More than three times a week    Frequency of Social Gatherings with Friends and Family: Once a week    Attends Religious Services: More than  4 times per year    Active Member of Clubs or Organizations: Yes    Attends Archivist Meetings: More than 4 times per year    Marital Status: Married    Tobacco Counseling Counseling given: Not Answered   Clinical Intake:  Pre-visit preparation completed: Yes  Pain : No/denies pain  Diabetes: No  How often do you need to have someone help you when you read instructions, pamphlets, or other written materials from your doctor or pharmacy?: 1 - Never   Activities of Daily Living    08/01/2022    3:36 PM  In your present state of health, do you have any difficulty performing the following activities:  Hearing? 0  Vision? 0  Comment wears readers  Difficulty concentrating or making decisions? 0  Walking or climbing stairs? 1  Comment has arthritis  Dressing or bathing? 0  Doing errands, shopping? 0  Preparing Food and eating ? N  Using the Toilet? N  In the past six months, have you accidently leaked urine? N  Do you have problems with loss of bowel control? N  Managing your Medications? N  Managing your Finances? N  Housekeeping or managing your Housekeeping? N    Patient Care Team: Mosie Lukes, MD as PCP - General (Family Medicine) Rigoberto Noel, MD as Consulting Physician (Pulmonary Disease) Lelon Perla, MD as Consulting Physician (Cardiology) Myra Rude (Inactive) as Consulting Physician (Gastroenterology)  Indicate any recent Medical Services  you may have received from other than [facility] providers in the past year (date may be approximate).     Assessment:   This is a routine wellness examination for Lilybeth.  Hearing/Vision screen No results found.  Dietary issues and exercise activities discussed: Current Exercise Habits: Home exercise routine, Type of exercise: stretching, Time (Minutes): 10, Frequency (Times/Week): 7, Weekly Exercise (Minutes/Week): 70, Intensity: Mild, Exercise limited by: orthopedic condition(s)   Goals Addressed   None    Depression Screen    08/01/2022    3:35 PM 04/25/2022    1:33 PM 01/12/2022    2:52 PM 09/22/2021    1:49 PM 07/07/2021   12:24 PM 01/11/2021   10:57 AM 06/24/2020   11:23 AM  PHQ 2/9 Scores  PHQ - 2 Score 0 0 0 0 0 0 0  PHQ- 9 Score   0 2       Fall Risk    08/01/2022    3:33 PM 04/25/2022    1:33 PM 01/12/2022    2:52 PM 09/22/2021    1:49 PM 07/07/2021   12:24 PM  [place] in the past year? 0 0 0 0 0  Number falls in past yr: 0 0 0 0 0  Injury with Fall? 0 0 1 0 0  Risk for fall due to : No Fall Risks No Fall Risks  No Fall Risks No Fall Risks  Follow up Falls evaluation completed Falls evaluation completed  Falls evaluation completed Falls evaluation completed    [place]:  Any stairs in or around the home? Yes  If so, are there any without handrails? No  Home free of loose throw rugs in walkways, pet beds, electrical cords, etc? Yes  Adequate lighting in your home to reduce risk of falls? Yes   ASSISTIVE DEVICES UTILIZED TO PREVENT FALLS:  Life alert? No  Use of a cane, walker or w/c? No  Grab bars in the bathroom?  Yes  Shower chair or bench in shower? No  Elevated toilet seat or a handicapped toilet? No   TIMED UP AND GO:  Was the test performed?  No,audio visit .    Cognitive Function:        08/01/2022    3:43 PM  6CIT Screen  What Year? 0 points  What month? 0 points  What time? 0 points  Count back from  20 0 points  Months in reverse 0 points  Repeat phrase 0 points  Total Score 0 points    Immunizations Immunization History  Administered Date(s) Administered   COVID-19, mRNA, vaccine(Comirnaty)12 years and older 03/27/2022   Fluad Quad(high Dose 65+) 02/11/2019, 03/15/2020, 03/22/2021, 03/07/2022   Influenza,inj,Quad PF,6+ Mos 03/24/2013, 04/07/2014, 02/11/2015, 06/01/2016, 03/29/2017, 04/30/2018   PFIZER(Purple Top)SARS-COV-2 Vaccination 07/19/2019, 08/12/2019, 04/09/2020   Pneumococcal Conjugate-13 04/10/2013, 01/11/2021   Respiratory Syncytial Virus Vaccine,Recomb Aduvanted(Arexvy) 04/13/2022   Tdap 05/29/2012    TDAP status: Due, Education has been provided regarding the importance of this vaccine. Advised may receive this vaccine at local pharmacy or Health Dept. Aware to provide a copy of the vaccination record if obtained from local pharmacy or Health Dept. Verbalized acceptance and understanding.  Flu Vaccine status: Up to date  Pneumococcal vaccine status: Due, Education has been provided regarding the importance of this vaccine. Advised may receive this vaccine at local pharmacy or Health Dept. Aware to provide a copy of the vaccination record if obtained from local pharmacy or Health Dept. Verbalized acceptance and understanding.  Covid-19 vaccine status: Information provided on how to obtain vaccines.   Qualifies for Shingles Vaccine? Yes   Zostavax completed No   Shingrix Completed?: No.    Education has been provided regarding the importance of this vaccine. Patient has been advised to call insurance company to determine out of pocket expense if they have not yet received this vaccine. Advised may also receive vaccine at local pharmacy or Health Dept. Verbalized acceptance and understanding.  Screening Tests Health Maintenance  Topic Date Due   Medicare Annual Wellness (AWV)  Never done   Zoster Vaccines- Shingrix (1 of 2) Never done   Pneumonia Vaccine 65+ Years  old (2 of 2 - PPSV23 or PCV20) 01/11/2022   DTaP/Tdap/Td (2 - Td or Tdap) 05/29/2022   MAMMOGRAM  01/18/2024   COLONOSCOPY (Pts 45-65yr Insurance coverage will need to be confirmed)  03/25/2025   INFLUENZA VACCINE  Completed   DEXA SCAN  Completed   COVID-19 Vaccine  Completed   Hepatitis C Screening  Completed   HPV VACCINES  Aged Out    Health Maintenance  Health Maintenance Due  Topic Date Due   Medicare Annual Wellness (AWV)  Never done   Zoster Vaccines- Shingrix (1 of 2) Never done   Pneumonia Vaccine 70 Years old (2 of 2 - PPSV23 or PCV20) 01/11/2022   DTaP/Tdap/Td (2 - Td or Tdap) 05/29/2022    Colorectal cancer screening: Type of screening: Colonoscopy. Completed 02/28/20. Repeat every 5 years  Mammogram status: Completed 01/17/22. Repeat every year  Bone Density status: Completed 10/26/20. Results reflect: Bone density results: OSTEOPENIA. Repeat every 2 years.  Lung Cancer Screening: (Low Dose CT Chest recommended if Age 70-80years, 30 pack-year currently smoking OR have quit w/in 15years.) does not qualify.   Additional Screening:  Hepatitis C Screening: does qualify; Completed 02/11/15  Vision Screening: Recommended annual ophthalmology exams for early detection of glaucoma and other disorders of the eye. Is the patient up to date  with their annual eye exam?  No  Who is the provider or what is the name of the office in which the patient attends annual eye exams? Dr. Bing Plume If pt is not established with a provider, would they like to be referred to a provider to establish care? No .   Dental Screening: Recommended annual dental exams for proper oral hygiene  Community Resource Referral / Chronic Care Management: CRR required this visit?  No   CCM required this visit?  No      Plan:     I have personally reviewed and noted the following in the patient's chart:   Medical and social history Use of alcohol, tobacco or illicit drugs  Current medications and  supplements including opioid prescriptions. Patient is not currently taking opioid prescriptions. Functional ability and status Nutritional status Physical activity Advanced directives List of other physicians Hospitalizations, surgeries, and ER visits in previous 12 months Vitals Screenings to include cognitive, depression, and falls Referrals and appointments  In addition, I have reviewed and discussed with patient certain preventive protocols, quality metrics, and best practice recommendations. A written personalized care plan for preventive services as well as general preventive health recommendations were provided to patient.   Due to this being a telephonic visit, the after visit summary with patients personalized plan was offered to patient via mail or my-chart. Patient would like to access on my-chart.  Beatris Ship, [place]   08/01/2022   Nurse Notes: None

## 2022-08-07 ENCOUNTER — Ambulatory Visit (INDEPENDENT_AMBULATORY_CARE_PROVIDER_SITE_OTHER): Payer: Medicare (Managed Care) | Admitting: Family

## 2022-08-07 VITALS — BP 138/56 | HR 55 | Temp 97.8°F | Resp 16 | Wt 190.0 lb

## 2022-08-07 DIAGNOSIS — H1132 Conjunctival hemorrhage, left eye: Secondary | ICD-10-CM | POA: Diagnosis not present

## 2022-08-07 DIAGNOSIS — I10 Essential (primary) hypertension: Secondary | ICD-10-CM | POA: Diagnosis not present

## 2022-08-07 MED ORDER — CARVEDILOL 6.25 MG PO TABS: 6.2500 mg | ORAL_TABLET | Freq: Two times a day (BID) | ORAL | 1 refills | Status: DC

## 2022-08-07 MED ORDER — ROSUVASTATIN CALCIUM 10 MG PO TABS: 10.0000 mg | ORAL_TABLET | Freq: Every day | ORAL | 1 refills | Status: DC

## 2022-08-07 MED ORDER — LOSARTAN POTASSIUM 25 MG PO TABS: 25.0000 mg | ORAL_TABLET | Freq: Every day | ORAL | 1 refills | Status: DC

## 2022-08-07 NOTE — Progress Notes (Signed)
Subjective:     Patient ID: Margaret [name], adult    DOB: [birth year], 72 y.o.   MRN: [id number]  Chief Complaint  Patient presents with   Eye Problem    Patient complains of left eye redness since Friday.     Eye Problem    Patient is in today with complaint of redness left eye since Friday. Denies pain or trauma.  Notes it has been improving over the weekent.   She is also requesting a handicapped placard form to renew her 6 month temporary permit.   Health Maintenance Due  Topic Date Due   Zoster Vaccines- Shingrix (1 of 2) Never done   Pneumonia Vaccine 32+ Years old (2 of 2 - PPSV23 or PCV20) 01/11/2022   DTaP/Tdap/Td (2 - Td or Tdap) 05/29/2022    Past Medical History:  Diagnosis Date   Allergic state 03/25/2012   Allergy    Aortic calcification ([place]) 08/27/2014   Atherosclerosis of aorta ([place]) 04/12/2014   Back pain    Constipation 01/23/2017   COVID-19 02/21/2021   GERD (gastroesophageal reflux disease)    Hiatal hernia with gastroesophageal reflux 04/27/2010   Qualifier: Diagnosis of  By: Danelle Earthly CMA, Darlene  Failed Omeprazole and Protonix    Hx of adenomatous polyp of colon 01/23/2017   Colonoscopy 01/16/2017 with 2 polyps one was adenomatous. recommeded follow up in 5 years.performed by Dr Silverio Decamp   Hyperlipidemia    Hypertension    Joint pain    Osteopenia 08/31/2012   Peripheral edema 12/13/2014   Sleep apnea    Unspecified sinusitis (chronic) 03/30/2013    Past Surgical History:  Procedure Laterality Date   24 HOUR [place] STUDY N/A 12/15/2015   Procedure: 24 HOUR [place] STUDY;  Surgeon: Mauri Pole, MD;  [facility];  Service: Endoscopy;  Laterality: N/A;   DILATION AND CURETTAGE OF UTERUS     ESOPHAGEAL MANOMETRY N/A 12/15/2015   Procedure: ESOPHAGEAL MANOMETRY (EM);  Surgeon: Mauri Pole, MD;  [facility];  Service: Endoscopy;  Laterality: N/A;    Family History  Problem Relation Age of Onset   Stroke Mother     High blood pressure Mother    High Cholesterol Mother    Lung cancer Father    Stroke Sister        2   Thyroid disease Sister    Diabetes Sister    Hypertension Sister    Hyperlipidemia Sister    Hypertension Sister    Sleep apnea Son    Obesity Son    Colon cancer Neg Hx    Esophageal cancer Neg Hx    Stomach cancer Neg Hx     Social History   Socioeconomic History   Marital status: Married    Spouse name: [name]   Number of children: 2   Years of education: 45   Highest education level: Not on file  Occupational History   Occupation: Optometrist    Comment: retired  Tobacco Use   Smoking status: Never   Smokeless tobacco: Never  Vaping Use   Vaping Use: Never used  Substance and Sexual Activity   Alcohol use: Yes    Alcohol/week: 0.0 standard drinks of alcohol    Comment: Rarely   Drug use: No   Sexual activity: Not Currently    Comment: no dietary restrictions, lives with husand, works at [employer]  Other Topics Concern   Not on file  Social History Narrative   Lives with husband  Caffeine- tea, 2 cups daily   Social Determinants of Health   Financial Resource Strain: Low Risk  (08/01/2022)   Overall Financial Resource Strain (CARDIA)    Difficulty of Paying Living Expenses: Not hard at all  Food Insecurity: No Food Insecurity (08/01/2022)   Hunger Vital Sign    Worried About Running Out of Food in the Last Year: Never true    Ran Out of Food in the Last Year: Never true  Transportation Needs: No Transportation Needs (08/01/2022)   PRAPARE - Hydrologist (Medical): No    Lack of Transportation (Non-Medical): No  Physical Activity: Inactive (08/01/2022)   Exercise Vital Sign    Days of Exercise per Week: 0 days    Minutes of Exercise per Session: 0 min  Stress: No Stress Concern Present (08/01/2022)   [place]    Feeling of Stress : Not at all  Social Connections:  [place] (08/01/2022)   Social Connection and Isolation Panel [NHANES]    Frequency of Communication with Friends and Family: More than three times a week    Frequency of Social Gatherings with Friends and Family: Once a week    Attends Religious Services: More than 4 times per year    Active Member of [employer] or Organizations: Yes    Attends Music therapist: More than 4 times per year    Marital Status: Married  Human resources officer Violence: Not At Risk (08/01/2022)   Humiliation, Afraid, Rape, and Kick questionnaire    Fear of Current or Ex-Partner: No    Emotionally Abused: No    Physically Abused: No    Sexually Abused: No    Outpatient Medications Prior to Visit  Medication Sig Dispense Refill   amLODipine (NORVASC) 5 MG tablet TAKE 1 TABLET(5 MG) BY MOUTH DAILY 90 tablet 3   aspirin 81 MG chewable tablet Chew by mouth daily.     fish oil-omega-3 fatty acids 1000 MG capsule Take 2 g by mouth daily.     lidocaine (LIDODERM) 5 % Place 1 patch onto the skin every 12 (twelve) hours. Remove & Discard patch within 12 hours or as directed by MD 180 each 2   Multiple Vitamin (MULTIVITAMIN WITH MINERALS) TABS tablet Take 1 tablet by mouth daily.     omeprazole (PRILOSEC) 40 MG capsule Take 1 capsule (40 mg total) by mouth daily. 90 capsule 0   sucralfate (CARAFATE) 1 g tablet TAKE 1 TABLET(1 GRAM) BY MOUTH FOUR TIMES DAILY AT BEDTIME WITH MEALS 368 tablet 1   VITAMIN D PO Take 2,500 Units by mouth daily.     Zinc 50 MG TABS Take 1 tablet by mouth daily.     carvedilol (COREG) 6.25 MG tablet Take 1 tablet (6.25 mg total) by mouth 2 (two) times daily with a meal. 180 tablet 0   losartan (COZAAR) 25 MG tablet Take 1 tablet (25 mg total) by mouth daily. 90 tablet 2   rosuvastatin (CRESTOR) 10 MG tablet Take 1 tablet (10 mg total) by mouth daily. 90 tablet 3   No facility-administered medications prior to visit.    Allergies  Allergen Reactions   Lisinopril Cough    Potassium-Containing Compounds Rash    ROS    See  HPI Objective:    Physical Exam Constitutional:      Appearance: Normal appearance.  HENT:     Head: Normocephalic and atraumatic.  Eyes:  General: Lids are normal.     Extraocular Movements: Extraocular movements intact.     Conjunctiva/sclera:     Right eye: Right conjunctiva is injected (medial subconjunctival hemorrhage is noted).  Pulmonary:     Effort: Pulmonary effort is normal.  Skin:    General: Skin is warm and dry.  Neurological:     Mental Status: She is alert.     BP (!) 138/56 (BP Location: Left Arm, Patient Position: Sitting)   Pulse (!) 55   Temp 97.8 F (36.6 C) (Oral)   Resp 16   Wt 190 lb (86.2 kg)   SpO2 100%   BMI 34.75 kg/m  Wt Readings from Last 3 Encounters:  08/07/22 190 lb (86.2 kg)  04/25/22 193 lb (87.5 kg)  04/11/22 193 lb (87.5 kg)       Assessment & Plan:   Problem List Items Addressed This Visit       Unprioritized   Subconjunctival hemorrhage of left eye - Primary    New.  Reassurance provided re: self limited nature and a handout was attached to pt's mychart.       Essential hypertension    BP Readings from Last 3 Encounters:  08/07/22 (!) 138/56  04/25/22 (!) 127/59  04/11/22 (!) 150/80  BP is stable/at goal. Continue carvedilol and amlodipine.       Relevant Medications   losartan (COZAAR) 25 MG tablet   rosuvastatin (CRESTOR) 10 MG tablet   carvedilol (COREG) 6.25 MG tablet   Handicapped placard filled for her chronic low back pain and given to patient.   20 minutes spent on today's visit. Time was spent reviewing chart, examining patient and counseling pt re: subconjunctival hemorrhage.  I am having Margaret Vasques "[name]" maintain her fish oil-omega-3 fatty acids, multivitamin with minerals, aspirin, VITAMIN D PO, Zinc, lidocaine, sucralfate, omeprazole, amLODipine, losartan, rosuvastatin, and carvedilol.  Meds ordered this encounter  Medications    losartan (COZAAR) 25 MG tablet    Sig: Take 1 tablet (25 mg total) by mouth daily.    Dispense:  90 tablet    Refill:  1   rosuvastatin (CRESTOR) 10 MG tablet    Sig: Take 1 tablet (10 mg total) by mouth daily.    Dispense:  90 tablet    Refill:  1   carvedilol (COREG) 6.25 MG tablet    Sig: Take 1 tablet (6.25 mg total) by mouth 2 (two) times daily with a meal.    Dispense:  180 tablet    Refill:  1    Please place on file.

## 2022-08-07 NOTE — Assessment & Plan Note (Signed)
BP Readings from Last 3 Encounters:  08/07/22 (!) 138/56  04/25/22 (!) 127/59  04/11/22 (!) 150/80   BP is stable/at goal. Continue carvedilol and amlodipine.

## 2022-08-07 NOTE — Assessment & Plan Note (Signed)
New.  Reassurance provided re: self limited nature and a handout was attached to pt's mychart.

## 2022-09-11 ENCOUNTER — Encounter: Payer: Self-pay | Admitting: *Deleted

## 2022-09-13 ENCOUNTER — Other Ambulatory Visit: Payer: Self-pay | Admitting: Family Medicine

## 2022-09-13 NOTE — Assessment & Plan Note (Signed)
Hydrate and monitor 

## 2022-09-13 NOTE — Assessment & Plan Note (Signed)
Encouraged to get adequate exercise, calcium and vitamin d intake 

## 2022-09-13 NOTE — Assessment & Plan Note (Signed)
Encourage heart healthy diet such as MIND or DASH diet, increase exercise, avoid trans fats, simple carbohydrates and processed foods, consider a krill or fish or flaxseed oil cap daily. Tolerating Rosuvastatin 

## 2022-09-13 NOTE — Assessment & Plan Note (Signed)
Supplement and monitor 

## 2022-09-13 NOTE — Assessment & Plan Note (Signed)
Well controlled, no changes to meds. Encouraged heart healthy diet such as the DASH diet and exercise as tolerated.  °

## 2022-09-13 NOTE — Assessment & Plan Note (Signed)
hgba1c acceptable, minimize simple carbs. Increase exercise as tolerated.  

## 2022-09-14 ENCOUNTER — Ambulatory Visit (INDEPENDENT_AMBULATORY_CARE_PROVIDER_SITE_OTHER): Payer: Medicare (Managed Care) | Admitting: Family Medicine

## 2022-09-14 VITALS — BP 128/76 | HR 74 | Temp 98.0°F | Resp 16 | Ht 62.0 in | Wt 190.2 lb

## 2022-09-14 DIAGNOSIS — R252 Cramp and spasm: Secondary | ICD-10-CM

## 2022-09-14 DIAGNOSIS — E559 Vitamin D deficiency, unspecified: Secondary | ICD-10-CM

## 2022-09-14 DIAGNOSIS — M545 Low back pain, unspecified: Secondary | ICD-10-CM | POA: Diagnosis not present

## 2022-09-14 DIAGNOSIS — I10 Essential (primary) hypertension: Secondary | ICD-10-CM

## 2022-09-14 DIAGNOSIS — R0600 Dyspnea, unspecified: Secondary | ICD-10-CM | POA: Diagnosis not present

## 2022-09-14 DIAGNOSIS — R739 Hyperglycemia, unspecified: Secondary | ICD-10-CM

## 2022-09-14 DIAGNOSIS — Z0000 Encounter for general adult medical examination without abnormal findings: Secondary | ICD-10-CM

## 2022-09-14 DIAGNOSIS — Z0001 Encounter for general adult medical examination with abnormal findings: Secondary | ICD-10-CM

## 2022-09-14 DIAGNOSIS — M8580 Other specified disorders of bone density and structure, unspecified site: Secondary | ICD-10-CM | POA: Diagnosis not present

## 2022-09-14 DIAGNOSIS — Z124 Encounter for screening for malignant neoplasm of cervix: Secondary | ICD-10-CM

## 2022-09-14 DIAGNOSIS — E782 Mixed hyperlipidemia: Secondary | ICD-10-CM

## 2022-09-14 LAB — COMPREHENSIVE METABOLIC PANEL
ALT: 47 U/L — ABNORMAL HIGH (ref 0–35)
AST: 35 U/L (ref 0–37)
Albumin: 4.3 g/dL (ref 3.5–5.2)
Alkaline Phosphatase: 89 U/L (ref 39–117)
BUN: 12 mg/dL (ref 6–23)
CO2: 28 mEq/L (ref 19–32)
Calcium: 9.5 mg/dL (ref 8.4–10.5)
Chloride: 102 mEq/L (ref 96–112)
Creatinine, Ser: 0.68 mg/dL (ref 0.40–1.20)
GFR: 88.91 mL/min (ref >60.00)
Glucose, Bld: 107 mg/dL — ABNORMAL HIGH (ref 70–99)
Potassium: 4.5 mEq/L (ref 3.5–5.1)
Sodium: 138 mEq/L (ref 135–145)
Total Bilirubin: 0.6 mg/dL (ref 0.2–1.2)
Total Protein: 7.4 g/dL (ref 6.0–8.3)

## 2022-09-14 LAB — VITAMIN D 25 HYDROXY (VIT D DEFICIENCY, FRACTURES): VITD: 36.65 ng/mL (ref 30.00–100.00)

## 2022-09-14 LAB — CBC WITH DIFFERENTIAL/PLATELET
Basophils Absolute: 0.1 10*3/uL (ref 0.0–0.1)
Basophils Relative: 0.8 % (ref 0.0–3.0)
Eosinophils Absolute: 0.2 10*3/uL (ref 0.0–0.7)
Eosinophils Relative: 3.4 % (ref 0.0–5.0)
HCT: 39 % (ref 36.0–46.0)
Hemoglobin: 13.1 g/dL (ref 12.0–15.0)
Lymphocytes Relative: 29.4 % (ref 12.0–46.0)
Lymphs Abs: 2.1 10*3/uL (ref 0.7–4.0)
MCHC: 33.6 g/dL (ref 30.0–36.0)
MCV: 89 fl (ref 78.0–100.0)
Monocytes Absolute: 0.9 10*3/uL (ref 0.1–1.0)
Monocytes Relative: 11.7 % (ref 3.0–12.0)
Neutro Abs: 4 10*3/uL (ref 1.4–7.7)
Neutrophils Relative %: 54.7 % (ref 43.0–77.0)
Platelets: 237 10*3/uL (ref 150.0–400.0)
RBC: 4.38 Mil/uL (ref 3.87–5.11)
RDW: 13.8 % (ref 11.5–15.5)
WBC: 7.3 10*3/uL (ref 4.0–10.5)

## 2022-09-14 LAB — LIPID PANEL
Cholesterol: 163 mg/dL (ref 0–200)
HDL: 55 mg/dL (ref >39.00)
LDL Cholesterol: 93 mg/dL (ref 0–99)
NonHDL: 108.22
Total CHOL/HDL Ratio: 3
Triglycerides: 78 mg/dL (ref 0.0–149.0)
VLDL: 15.6 mg/dL (ref 0.0–40.0)

## 2022-09-14 LAB — TSH: TSH: 1.62 u[IU]/mL (ref 0.35–5.50)

## 2022-09-14 LAB — HEMOGLOBIN A1C: Hgb A1c MFr Bld: 6.4 % (ref 4.6–6.5)

## 2022-09-14 LAB — MAGNESIUM: Magnesium: 2.4 mg/dL (ref 1.5–2.5)

## 2022-09-14 MED ORDER — AMOXICILLIN 500 MG PO CAPS: 500.0000 mg | ORAL_CAPSULE | Freq: Three times a day (TID) | ORAL | 0 refills | Status: AC

## 2022-09-14 NOTE — Patient Instructions (Signed)
Shingrix is the new shingles shot, 2 shots over 2-6 months, confirm coverage with insurance and document, then can return here for shots with nurse appt or at pharmacy   Tetanus shot at pharmacy  Preventive Care 65 Years and Older, Female Preventive care refers to lifestyle choices and visits with your health care provider that can promote health and wellness. Preventive care visits are also called wellness exams. What can I expect for my preventive care visit? Counseling Your health care provider may ask you questions about your: Medical history, including: Past medical problems. Family medical history. Pregnancy and menstrual history. History of falls. Current health, including: Memory and ability to understand (cognition). Emotional well-being. Home life and relationship well-being. Sexual activity and sexual health. Lifestyle, including: Alcohol, nicotine or tobacco, and drug use. Access to firearms. Diet, exercise, and sleep habits. Work and work Astronomer. Sunscreen use. Safety issues such as seatbelt and bike helmet use. Physical exam Your health care provider will check your: Height and weight. These may be used to calculate your BMI (body mass index). BMI is a measurement that tells if you are at a healthy weight. Waist circumference. This measures the distance around your waistline. This measurement also tells if you are at a healthy weight and may help predict your risk of certain diseases, such as type 2 diabetes and high blood pressure. Heart rate and blood pressure. Body temperature. Skin for abnormal spots. What immunizations do I need?  Vaccines are usually given at various ages, according to a schedule. Your health care provider will recommend vaccines for you based on your age, medical history, and lifestyle or other factors, such as travel or where you work. What tests do I need? Screening Your health care provider may recommend screening tests for certain  conditions. This may include: Lipid and cholesterol levels. Hepatitis C test. Hepatitis B test. HIV (human immunodeficiency virus) test. STI (sexually transmitted infection) testing, if you are at risk. Lung cancer screening. Colorectal cancer screening. Diabetes screening. This is done by checking your blood sugar (glucose) after you have not eaten for a while (fasting). Mammogram. Talk with your health care provider about how often you should have regular mammograms. BRCA-related cancer screening. This may be done if you have a family history of breast, ovarian, tubal, or peritoneal cancers. Bone density scan. This is done to screen for osteoporosis. Talk with your health care provider about your test results, treatment options, and if necessary, the need for more tests. Follow these instructions at home: Eating and drinking  Eat a diet that includes fresh fruits and vegetables, whole grains, lean protein, and low-fat dairy products. Limit your intake of foods with high amounts of sugar, saturated fats, and salt. Take vitamin and mineral supplements as recommended by your health care provider. Do not drink alcohol if your health care provider tells you not to drink. If you drink alcohol: Limit how much you have to 0-1 drink a day. Know how much alcohol is in your drink. In the [place] one drink equals one 12 oz bottle of beer (355 mL), one 5 oz glass of wine (148 mL), or one 1 oz glass of hard liquor (44 mL). Lifestyle Brush your teeth every morning and night with fluoride toothpaste. Floss one time each day. Exercise for at least 30 minutes 5 or more days each week. Do not use any products that contain nicotine or tobacco. These products include cigarettes, chewing tobacco, and vaping devices, such as e-cigarettes. If you need help quitting,  ask your health care provider. Do not use drugs. If you are sexually active, practice safe sex. Use a condom or other form of protection in order to  prevent STIs. Take aspirin only as told by your health care provider. Make sure that you understand how much to take and what form to take. Work with your health care provider to find out whether it is safe and beneficial for you to take aspirin daily. Ask your health care provider if you need to take a cholesterol-lowering medicine (statin). Find healthy ways to manage stress, such as: Meditation, yoga, or listening to music. Journaling. Talking to a trusted person. Spending time with friends and family. Minimize exposure to UV radiation to reduce your risk of skin cancer. Safety Always wear your seat belt while driving or riding in a vehicle. Do not drive: If you have been drinking alcohol. Do not ride with someone who has been drinking. When you are tired or distracted. While texting. If you have been using any mind-altering substances or drugs. Wear a helmet and other protective equipment during sports activities. If you have firearms in your house, make sure you follow all gun safety procedures. What's next? Visit your health care provider once a year for an annual wellness visit. Ask your health care provider how often you should have your eyes and teeth checked. Stay up to date on all vaccines. This information is not intended to replace advice given to you by your health care provider. Make sure you discuss any questions you have with your health care provider. Document Revised: 11/10/2020 Document Reviewed: 11/10/2020 Elsevier Patient Education  2023 [employer].

## 2022-09-14 NOTE — Assessment & Plan Note (Signed)
Has trouble with low back pain and it locks up when getting in car. Physical Therapy was completed but she is still having trouble. She is referred to [facility] ortho in [place]

## 2022-09-14 NOTE — Progress Notes (Signed)
Subjective:    Patient ID: Margaret [name], adult    DOB: Oct 20, 1950, 72 y.o.   MRN: [id number]  Chief Complaint  Patient presents with   Annual Exam    Annual Exam    HPI Patient is in today for follow up on chronic medical concerns and for annual preventative exam. No recent febrile illness or hospitalizations. Denies CP/palp/SOB/HA/congestion/fevers/GI or GU c/o. Taking meds as prescribed. She recently returned from 6 weeks visiting friends and family in the [place]. Other than the trip she has been trying to maintain a heart healthy diet. She stays active. Tries to maintain adequate hydration and sleep. No acute concerns  Past Medical History:  Diagnosis Date   Allergic state 03/25/2012   Allergy    Aortic calcification (HCC) 08/27/2014   Atherosclerosis of aorta (HCC) 04/12/2014   Back pain    Constipation 01/23/2017   COVID-19 02/21/2021   GERD (gastroesophageal reflux disease)    Hiatal hernia with gastroesophageal reflux 04/27/2010   Qualifier: Diagnosis of  By: Terrilee Croak CMA, Darlene  Failed Omeprazole and Protonix    Hx of adenomatous polyp of colon 01/23/2017   Colonoscopy 01/16/2017 with 2 polyps one was adenomatous. recommeded follow up in 5 years.performed by Dr Lavon Paganini   Hyperlipidemia    Hypertension    Joint pain    Osteopenia 08/31/2012   Peripheral edema 12/13/2014   Sleep apnea    Unspecified sinusitis (chronic) 03/30/2013    Past Surgical History:  Procedure Laterality Date   24 HOUR PH STUDY N/A 12/15/2015   Procedure: 24 HOUR PH STUDY;  Surgeon: Napoleon Form, MD;  Location: [facility];  Service: Endoscopy;  Laterality: N/A;   DILATION AND CURETTAGE OF UTERUS     ESOPHAGEAL MANOMETRY N/A 12/15/2015   Procedure: ESOPHAGEAL MANOMETRY (EM);  Surgeon: Napoleon Form, MD;  Location: [facility];  Service: Endoscopy;  Laterality: N/A;    Family History  Problem Relation Age of Onset   Stroke Mother    High blood pressure Mother    High  Cholesterol Mother    Lung cancer Father    Stroke Sister        12   Thyroid disease Sister    Diabetes Sister    Hypertension Sister    Hyperlipidemia Sister    Hypertension Sister    Sleep apnea Son    Obesity Son    Colon cancer Neg Hx    Esophageal cancer Neg Hx    Stomach cancer Neg Hx     Social History   Socioeconomic History   Marital status: Married    Spouse name: Sherilyn Cooter   Number of children: 2   Years of education: 16   Highest education level: Not on file  Occupational History   Occupation: Airline pilot    Comment: retired  Tobacco Use   Smoking status: Never   Smokeless tobacco: Never  Vaping Use   Vaping Use: Never used  Substance and Sexual Activity   Alcohol use: Yes    Alcohol/week: 0.0 standard drinks of alcohol    Comment: Rarely   Drug use: No   Sexual activity: Not Currently    Comment: no dietary restrictions, lives with husand, works at [employer]  Other Topics Concern   Not on file  Social History Narrative   Lives with husband   Caffeine- tea, 2 cups daily   Social Determinants of Health   Financial Resource Strain: Low Risk  (08/01/2022)   Overall Financial Resource Strain (CARDIA)  Difficulty of Paying Living Expenses: Not hard at all  Food Insecurity: No Food Insecurity (08/01/2022)   Hunger Vital Sign    Worried About Running Out of Food in the Last Year: Never true    Ran Out of Food in the Last Year: Never true  Transportation Needs: No Transportation Needs (08/01/2022)   PRAPARE - Administrator, Civil Service (Medical): No    Lack of Transportation (Non-Medical): No  Physical Activity: Inactive (08/01/2022)   Exercise Vital Sign    Days of Exercise per Week: 0 days    Minutes of Exercise per Session: 0 min  Stress: No Stress Concern Present (08/01/2022)   [employer] of Occupational Health - Occupational Stress Questionnaire    Feeling of Stress : Not at all  Social Connections: Socially Integrated (08/01/2022)   Social  Connection and Isolation Panel [NHANES]    Frequency of Communication with Friends and Family: More than three times a week    Frequency of Social Gatherings with Friends and Family: Once a week    Attends Religious Services: More than 4 times per year    Active Member of [employer] or Organizations: Yes    Attends Engineer, structural: More than 4 times per year    Marital Status: Married  Catering manager Violence: Not At Risk (08/01/2022)   Humiliation, Afraid, Rape, and Kick questionnaire    Fear of Current or Ex-Partner: No    Emotionally Abused: No    Physically Abused: No    Sexually Abused: No    Outpatient Medications Prior to Visit  Medication Sig Dispense Refill   amLODipine (NORVASC) 5 MG tablet TAKE 1 TABLET(5 MG) BY MOUTH DAILY 90 tablet 3   aspirin 81 MG chewable tablet Chew by mouth daily.     carvedilol (COREG) 6.25 MG tablet Take 1 tablet (6.25 mg total) by mouth 2 (two) times daily with a meal. 180 tablet 1   fish oil-omega-3 fatty acids 1000 MG capsule Take 2 g by mouth daily.     lidocaine (LIDODERM) 5 % Place 1 patch onto the skin every 12 (twelve) hours. Remove & Discard patch within 12 hours or as directed by MD 180 each 2   losartan (COZAAR) 25 MG tablet TAKE 1 TABLET(25 MG) BY MOUTH DAILY 90 tablet 1   Multiple Vitamin (MULTIVITAMIN WITH MINERALS) TABS tablet Take 1 tablet by mouth daily.     omeprazole (PRILOSEC) 40 MG capsule Take 1 capsule (40 mg total) by mouth daily. 90 capsule 0   rosuvastatin (CRESTOR) 10 MG tablet Take 1 tablet (10 mg total) by mouth daily. 90 tablet 1   sucralfate (CARAFATE) 1 g tablet TAKE 1 TABLET(1 GRAM) BY MOUTH FOUR TIMES DAILY AT BEDTIME WITH MEALS 368 tablet 1   VITAMIN D PO Take 2,500 Units by mouth daily.     Zinc 50 MG TABS Take 1 tablet by mouth daily.     No facility-administered medications prior to visit.    Allergies  Allergen Reactions   Lisinopril Cough   Potassium-Containing Compounds Rash    Review of  Systems  Constitutional:  Negative for chills, fever and malaise/fatigue.  HENT:  Positive for congestion. Negative for hearing loss and sore throat.   Eyes:  Negative for discharge.  Respiratory:  Positive for sputum production. Negative for cough, shortness of breath and stridor.   Cardiovascular:  Negative for chest pain, palpitations, leg swelling and PND.  Gastrointestinal:  Negative for abdominal pain, blood in  stool, constipation, diarrhea, heartburn, nausea and vomiting.  Genitourinary:  Negative for dysuria, frequency, hematuria and urgency.  Musculoskeletal:  Negative for back pain, falls and myalgias.  Skin:  Negative for rash.  Neurological:  Negative for dizziness, sensory change, loss of consciousness, weakness and headaches.  Endo/Heme/Allergies:  Negative for environmental allergies. Does not bruise/bleed easily.  Psychiatric/Behavioral:  Negative for depression and suicidal ideas. The patient is not nervous/anxious and does not have insomnia.        Objective:    Physical Exam Constitutional:      General: She is not in acute distress.    Appearance: Normal appearance. She is not diaphoretic.  HENT:     Head: Normocephalic and atraumatic.     Right Ear: Tympanic membrane, ear canal and external ear normal.     Left Ear: Tympanic membrane, ear canal and external ear normal.     Nose: Nose normal.     Mouth/Throat:     Mouth: Mucous membranes are moist.     Pharynx: Oropharynx is clear. No oropharyngeal exudate.  Eyes:     General: No scleral icterus.       Right eye: No discharge.        Left eye: No discharge.     Conjunctiva/sclera: Conjunctivae normal.     Pupils: Pupils are equal, round, and reactive to light.  Neck:     Thyroid: No thyromegaly.  Cardiovascular:     Rate and Rhythm: Normal rate and regular rhythm.     Heart sounds: Normal heart sounds. No murmur heard. Pulmonary:     Effort: Pulmonary effort is normal. No respiratory distress.     Breath  sounds: Normal breath sounds. No wheezing or rales.  Abdominal:     General: Bowel sounds are normal. There is no distension.     Palpations: Abdomen is soft. There is no mass.     Tenderness: There is no abdominal tenderness.  Musculoskeletal:        General: No tenderness. Normal range of motion.     Cervical back: Normal range of motion and neck supple.  Lymphadenopathy:     Cervical: No cervical adenopathy.  Skin:    General: Skin is warm and dry.     Findings: No rash.  Neurological:     General: No focal deficit present.     Mental Status: She is alert and oriented to person, place, and time.     Cranial Nerves: No cranial nerve deficit.     Coordination: Coordination normal.     Deep Tendon Reflexes: Reflexes are normal and symmetric. Reflexes normal.  Psychiatric:        Mood and Affect: Mood normal.        Behavior: Behavior normal.        Thought Content: Thought content normal.        Judgment: Judgment normal.     BP 128/76 (BP Location: Right Arm, Patient Position: Sitting, Cuff Size: Normal)   Pulse 74   Temp 98 F (36.7 C) (Oral)   Resp 16   Ht 5\' 2"  (1.575 m)   Wt 190 lb 3.2 oz (86.3 kg)   SpO2 95%   BMI 34.79 kg/m  Wt Readings from Last 3 Encounters:  09/14/22 190 lb 3.2 oz (86.3 kg)  08/07/22 190 lb (86.2 kg)  04/25/22 193 lb (87.5 kg)    Diabetic Foot Exam - Simple   No data filed    Lab Results  Component Value Date  WBC 7.3 09/14/2022   HGB 13.1 09/14/2022   HCT 39.0 09/14/2022   PLT 237.0 09/14/2022   GLUCOSE 107 (H) 09/14/2022   CHOL 163 09/14/2022   TRIG 78.0 09/14/2022   HDL 55.00 09/14/2022   LDLCALC 93 09/14/2022   ALT 47 (H) 09/14/2022   AST 35 09/14/2022   NA 138 09/14/2022   K 4.5 09/14/2022   CL 102 09/14/2022   CREATININE 0.68 09/14/2022   BUN 12 09/14/2022   CO2 28 09/14/2022   TSH 1.62 09/14/2022   HGBA1C 6.4 09/14/2022   MICROALBUR 3.06 (H) 05/16/2011    Lab Results  Component Value Date   TSH 1.62 09/14/2022    Lab Results  Component Value Date   WBC 7.3 09/14/2022   HGB 13.1 09/14/2022   HCT 39.0 09/14/2022   MCV 89.0 09/14/2022   PLT 237.0 09/14/2022   Lab Results  Component Value Date   NA 138 09/14/2022   K 4.5 09/14/2022   CO2 28 09/14/2022   GLUCOSE 107 (H) 09/14/2022   BUN 12 09/14/2022   CREATININE 0.68 09/14/2022   BILITOT 0.6 09/14/2022   ALKPHOS 89 09/14/2022   AST 35 09/14/2022   ALT 47 (H) 09/14/2022   PROT 7.4 09/14/2022   ALBUMIN 4.3 09/14/2022   CALCIUM 9.5 09/14/2022   ANIONGAP 8 04/25/2021   GFR 88.91 09/14/2022   Lab Results  Component Value Date   CHOL 163 09/14/2022   Lab Results  Component Value Date   HDL 55.00 09/14/2022   Lab Results  Component Value Date   LDLCALC 93 09/14/2022   Lab Results  Component Value Date   TRIG 78.0 09/14/2022   Lab Results  Component Value Date   CHOLHDL 3 09/14/2022   Lab Results  Component Value Date   HGBA1C 6.4 09/14/2022       Assessment & Plan:  Essential hypertension Assessment & Plan: Well controlled, no changes to meds. Encouraged heart healthy diet such as the DASH diet and exercise as tolerated.   Orders: -     CBC with Differential/Platelet -     Comprehensive metabolic panel -     TSH  Hyperglycemia Assessment & Plan: hgba1c acceptable, minimize simple carbs. Increase exercise as tolerated.   Orders: -     Hemoglobin A1c  Hyperlipidemia, mixed Assessment & Plan: Encourage heart healthy diet such as MIND or DASH diet, increase exercise, avoid trans fats, simple carbohydrates and processed foods, consider a krill or fish or flaxseed oil cap daily. Tolerating Rosuvastatin  Orders: -     Lipid panel  Vitamin D deficiency Assessment & Plan: Supplement and monitor  Orders: -     VITAMIN D 25 Hydroxy (Vit-D Deficiency, Fractures)  Osteopenia, unspecified location Assessment & Plan: Encouraged to get adequate exercise, calcium and vitamin d intake    Muscle cramps Assessment  & Plan: Hydrate and monitor   Orders: -     Magnesium  Dyspnea, unspecified type -     ECHOCARDIOGRAM COMPLETE; Future -     Ambulatory referral to Cardiology  Low back pain, unspecified back pain laterality, unspecified chronicity, unspecified whether sciatica present Assessment & Plan: Has trouble with low back pain and it locks up when getting in car. Physical Therapy was completed but she is still having trouble. She is referred to [facility] ortho in [place]   Orders: -     Ambulatory referral to Orthopedic Surgery  Cervical cancer screening -     Ambulatory referral to Obstetrics /  Gynecology  Preventative health care Assessment & Plan: Patient encouraged to maintain heart healthy diet, regular exercise, adequate sleep. Consider daily probiotics. Take medications as prescribed. Labs ordered and reviewed. MGM was 12/2021 repeat in 1 year. Last colonoscopy 2021 repeat in 2026. Pap with OB/GYN. Dexa was May 2022 repeat in 3-5 years. Given and reviewed copy of ACP documents from [facility] Secretary of State and encouraged to complete and return    Other orders -     Amoxicillin; Take 1 capsule (500 mg total) by mouth 3 (three) times daily for 10 days.  Dispense: 30 capsule; Refill: 0    Danise Edge, MD

## 2022-09-17 NOTE — Assessment & Plan Note (Addendum)
Patient encouraged to maintain heart healthy diet, regular exercise, adequate sleep. Consider daily probiotics. Take medications as prescribed. Labs ordered and reviewed. MGM was 12/2021 repeat in 1 year. Last colonoscopy 2021 repeat in 2026. Pap with OB/GYN. Dexa was May 2022 repeat in 3-5 years. Given and reviewed copy of ACP documents from [employer] and encouraged to complete and return

## 2022-09-18 ENCOUNTER — Telehealth: Payer: Self-pay

## 2022-09-18 NOTE — Telephone Encounter (Signed)
Called patient to schedule new patient appointment. Left voicemail with our contact information to call back and schedule.  

## 2022-10-04 DIAGNOSIS — G4733 Obstructive sleep apnea (adult) (pediatric): Secondary | ICD-10-CM | POA: Diagnosis not present

## 2022-10-10 ENCOUNTER — Telehealth: Payer: Self-pay | Admitting: *Deleted

## 2022-10-10 MED ORDER — AMLODIPINE BESYLATE 5 MG PO TABS: ORAL_TABLET | ORAL | 1 refills | Status: DC

## 2022-10-10 NOTE — Telephone Encounter (Signed)
Refill sent.

## 2022-10-19 ENCOUNTER — Ambulatory Visit (HOSPITAL_BASED_OUTPATIENT_CLINIC_OR_DEPARTMENT_OTHER): Admission: RE | Admit: 2022-10-19 | Payer: Medicare (Managed Care) | Source: Ambulatory Visit

## 2022-10-29 ENCOUNTER — Other Ambulatory Visit: Payer: Self-pay | Admitting: Family Medicine

## 2022-11-08 ENCOUNTER — Ambulatory Visit (HOSPITAL_BASED_OUTPATIENT_CLINIC_OR_DEPARTMENT_OTHER): Admission: RE | Admit: 2022-11-08 | Payer: Medicare (Managed Care) | Source: Ambulatory Visit

## 2022-11-13 ENCOUNTER — Telehealth: Payer: Self-pay | Admitting: Pharmacist

## 2022-11-13 NOTE — Telephone Encounter (Signed)
Pharmacy Quality Measure Review  This patient is appearing on a report for being at risk of failing the adherence measure for hypertension (ACEi/ARB) medications this calendar year.   BP Readings from Last 3 Encounters:  09/14/22 128/76  08/07/22 (!) 138/56  04/25/22 (!) 127/59    Medication: losartan 25mg  daily  Last fill date: Per gap report was 06/16/2022 for 90 day supply  Per Epic looks like losartan was filled at [facility] on 09/12/2022 for 90 day supply.   Called [facility] to verify that patient picked up this prescription - patient did pick up losartan on 09/14/2022

## 2022-12-05 ENCOUNTER — Ambulatory Visit (HOSPITAL_BASED_OUTPATIENT_CLINIC_OR_DEPARTMENT_OTHER)
Admission: RE | Admit: 2022-12-05 | Discharge: 2022-12-05 | Disposition: A | Discharge status: Home / Self Care | Payer: Medicare (Managed Care) | Source: Ambulatory Visit | Attending: Family Medicine | Admitting: Family Medicine

## 2022-12-05 DIAGNOSIS — R0609 Other forms of dyspnea: Secondary | ICD-10-CM | POA: Diagnosis not present

## 2022-12-05 DIAGNOSIS — R0600 Dyspnea, unspecified: Secondary | ICD-10-CM | POA: Diagnosis not present

## 2022-12-05 LAB — ECHOCARDIOGRAM COMPLETE
Area-P 1/2: 3.68 cm2
MV M vel: 4.32 m/s
MV Peak grad: 74.6 mmHg
S' Lateral: 3 cm

## 2022-12-13 NOTE — Assessment & Plan Note (Signed)
 Supplement and monitor 

## 2022-12-13 NOTE — Assessment & Plan Note (Signed)
 Encouraged DASH or MIND diet, decrease po intake and increase exercise as tolerated. Needs 7-8 hours of sleep nightly. Avoid trans fats, eat small, frequent meals every 4-5 hours with lean proteins, complex carbs and healthy fats. Minimize simple carbs, high fat foods and processed foods 

## 2022-12-13 NOTE — Assessment & Plan Note (Signed)
 Hydrate and monitor 

## 2022-12-13 NOTE — Assessment & Plan Note (Signed)
 Encourage heart healthy diet such as MIND or DASH diet, increase exercise, avoid trans fats, simple carbohydrates and processed foods, consider a krill or fish or flaxseed oil cap daily.  Tolerating Rosuvastatin 

## 2022-12-13 NOTE — Assessment & Plan Note (Signed)
 Well controlled, no changes to meds. Encouraged heart healthy diet such as the DASH diet and exercise as tolerated.  °

## 2022-12-13 NOTE — Progress Notes (Signed)
Subjective:    Patient ID: Margaret [name], adult    DOB: [birth year], 72 y.o.   MRN: [id number]  Chief Complaint  Patient presents with  . Follow-up    Follow up    HPI Discussed the use of AI scribe software for clinical note transcription with the patient, who gave verbal consent to proceed.  History of Present Illness   The patient, with a history of osteoarthritis in the back, reports that physical therapy has been beneficial in managing the condition. The patient acknowledges that the therapy does not cure the condition but helps manage the associated back pain. The patient also reports stiffness and pain in the morning, which is managed with specific exercises.  The patient presents a new issue of pain in the right heel, suspected to be a heel spur. The pain is particularly noticeable when getting up after sitting for a while. The patient has been managing the pain with stretches and changes in footwear.  The patient also mentions difficulty with weight loss, with weight fluctuating up and down. The patient acknowledges the need for dietary changes and increased physical activity to manage weight.  The patient also mentions a previous high liver marker (AST), which has since improved. The patient is aware of the need to manage this through diet and exercise.    Patient is a 72 yo female in today for follow up on chronic medical concerns. No recent febrile illness or hospitalizations. Denies CP/palp/SOB/HA/congestion/fevers/GI or GU c/o. Taking meds as prescribed     Past Medical History:  Diagnosis Date  . Allergic state 03/25/2012  . Allergy   . Aortic calcification (HCC) 08/27/2014  . Atherosclerosis of aorta (HCC) 04/12/2014  . Back pain   . Constipation 01/23/2017  . COVID-19 02/21/2021  . GERD (gastroesophageal reflux disease)   . Hiatal hernia with gastroesophageal reflux 04/27/2010   Qualifier: Diagnosis of  By: Terrilee Croak CMA, Darlene  Failed Omeprazole and Protonix    . Hx of adenomatous polyp of colon 01/23/2017   Colonoscopy 01/16/2017 with 2 polyps one was adenomatous. recommeded follow up in 5 years.performed by Dr Lavon Paganini  . Hyperlipidemia   . Hypertension   . Joint pain   . Osteopenia 08/31/2012  . Peripheral edema 12/13/2014  . Sleep apnea   . Unspecified sinusitis (chronic) 03/30/2013    Past Surgical History:  Procedure Laterality Date  . 24 HOUR PH STUDY N/A 12/15/2015   Procedure: 24 HOUR PH STUDY;  Surgeon: Napoleon Form, MD;  Location: [facility];  Service: Endoscopy;  Laterality: N/A;  . DILATION AND CURETTAGE OF UTERUS    . ESOPHAGEAL MANOMETRY N/A 12/15/2015   Procedure: ESOPHAGEAL MANOMETRY (EM);  Surgeon: Napoleon Form, MD;  Location: [facility];  Service: Endoscopy;  Laterality: N/A;    Family History  Problem Relation Age of Onset  . Stroke Mother   . High blood pressure Mother   . High Cholesterol Mother   . Lung cancer Father   . Stroke Sister        73  . Thyroid disease Sister   . Diabetes Sister   . Hypertension Sister   . Hyperlipidemia Sister   . Hypertension Sister   . Sleep apnea Son   . Obesity Son   . Colon cancer Neg Hx   . Esophageal cancer Neg Hx   . Stomach cancer Neg Hx     Social History   Socioeconomic History  . Marital status: Married    Spouse name: Sherilyn Cooter  .  Number of children: 2  . Years of education: 68  . Highest education level: Not on file  Occupational History  . Occupation: Airline pilot    Comment: retired  Tobacco Use  . Smoking status: Never  . Smokeless tobacco: Never  Vaping Use  . Vaping status: Never Used  Substance and Sexual Activity  . Alcohol use: Yes    Alcohol/week: 0.0 standard drinks of alcohol    Comment: Rarely  . Drug use: No  . Sexual activity: Not Currently    Comment: no dietary restrictions, lives with husand, works at [employer]  Other Topics Concern  . Not on file  Social History Narrative   Lives with husband   Caffeine- tea, 2 cups daily    Social Determinants of Health   Financial Resource Strain: Low Risk  (08/01/2022)   Overall Financial Resource Strain (CARDIA)   . Difficulty of Paying Living Expenses: Not hard at all  Food Insecurity: No Food Insecurity (08/01/2022)   Hunger Vital Sign   . Worried About Programme researcher, broadcasting/film/video in the Last Year: Never true   . Ran Out of Food in the Last Year: Never true  Transportation Needs: No Transportation Needs (08/01/2022)   PRAPARE - Transportation   . Lack of Transportation (Medical): No   . Lack of Transportation (Non-Medical): No  Physical Activity: Inactive (08/01/2022)   Exercise Vital Sign   . Days of Exercise per Week: 0 days   . Minutes of Exercise per Session: 0 min  Stress: No Stress Concern Present (08/01/2022)   [employer] of Occupational Health - Occupational Stress Questionnaire   . Feeling of Stress : Not at all  Social Connections: Socially Integrated (08/01/2022)   Social Connection and Isolation Panel [NHANES]   . Frequency of Communication with Friends and Family: More than three times a week   . Frequency of Social Gatherings with Friends and Family: Once a week   . Attends Religious Services: More than 4 times per year   . Active Member of Clubs or Organizations: Yes   . Attends Banker Meetings: More than 4 times per year   . Marital Status: Married  Catering manager Violence: Not At Risk (08/01/2022)   Humiliation, Afraid, Rape, and Kick questionnaire   . Fear of Current or Ex-Partner: No   . Emotionally Abused: No   . Physically Abused: No   . Sexually Abused: No    Outpatient Medications Prior to Visit  Medication Sig Dispense Refill  . amLODipine (NORVASC) 5 MG tablet TAKE 1 TABLET(5 MG) BY MOUTH DAILY 90 tablet 1  . aspirin 81 MG chewable tablet Chew by mouth daily.    . carvedilol (COREG) 6.25 MG tablet Take 1 tablet (6.25 mg total) by mouth 2 (two) times daily with a meal. 180 tablet 1  . fish oil-omega-3 fatty acids 1000 MG capsule  Take 2 g by mouth daily.    Marland Kitchen lidocaine (LIDODERM) 5 % Place 1 patch onto the skin every 12 (twelve) hours. Remove & Discard patch within 12 hours or as directed by MD 180 each 2  . losartan (COZAAR) 25 MG tablet TAKE 1 TABLET(25 MG) BY MOUTH DAILY 90 tablet 1  . Multiple Vitamin (MULTIVITAMIN WITH MINERALS) TABS tablet Take 1 tablet by mouth daily.    Marland Kitchen omeprazole (PRILOSEC) 40 MG capsule Take 1 capsule (40 mg total) by mouth daily. 90 capsule 0  . rosuvastatin (CRESTOR) 10 MG tablet TAKE 1 TABLET(10 MG) BY MOUTH DAILY 90  tablet 1  . sucralfate (CARAFATE) 1 g tablet TAKE 1 TABLET(1 GRAM) BY MOUTH FOUR TIMES DAILY AT BEDTIME WITH MEALS 368 tablet 1  . VITAMIN D PO Take 2,500 Units by mouth daily.    . Zinc 50 MG TABS Take 1 tablet by mouth daily.     No facility-administered medications prior to visit.    Allergies  Allergen Reactions  . Lisinopril Cough  . Potassium-Containing Compounds Rash    Review of Systems  Constitutional:  Negative for fever and malaise/fatigue.  HENT:  Negative for congestion.   Eyes:  Negative for blurred vision.  Respiratory:  Negative for shortness of breath.   Cardiovascular:  Negative for chest pain, palpitations and leg swelling.  Gastrointestinal:  Negative for abdominal pain, blood in stool and nausea.  Genitourinary:  Negative for dysuria and frequency.  Musculoskeletal:  Positive for back pain and joint pain. Negative for falls.  Skin:  Negative for rash.  Neurological:  Negative for dizziness, loss of consciousness and headaches.  Endo/Heme/Allergies:  Negative for environmental allergies.  Psychiatric/Behavioral:  Negative for depression. The patient is not nervous/anxious.        Objective:    Physical Exam Constitutional:      General: She is not in acute distress.    Appearance: Normal appearance. She is well-developed. She is not toxic-appearing.  HENT:     Head: Normocephalic and atraumatic.     Right Ear: External ear normal.      Left Ear: External ear normal.     Nose: Nose normal.  Eyes:     General:        Right eye: No discharge.        Left eye: No discharge.     Conjunctiva/sclera: Conjunctivae normal.  Neck:     Thyroid: No thyromegaly.  Cardiovascular:     Rate and Rhythm: Normal rate and regular rhythm.     Heart sounds: Normal heart sounds. No murmur heard. Pulmonary:     Effort: Pulmonary effort is normal. No respiratory distress.     Breath sounds: Normal breath sounds.  Abdominal:     General: Bowel sounds are normal.     Palpations: Abdomen is soft.     Tenderness: There is no abdominal tenderness. There is no guarding.  Musculoskeletal:        General: Normal range of motion.     Cervical back: Neck supple.  Lymphadenopathy:     Cervical: No cervical adenopathy.  Skin:    General: Skin is warm and dry.  Neurological:     Mental Status: She is alert and oriented to person, place, and time.  Psychiatric:        Mood and Affect: Mood normal.        Behavior: Behavior normal.        Thought Content: Thought content normal.        Judgment: Judgment normal.   BP 126/72 (BP Location: Left Arm, Patient Position: Sitting, Cuff Size: Normal)   Pulse (!) 56   Temp 98 F (36.7 C) (Oral)   Resp 16   Ht 5\' 2"  (1.575 m)   Wt 192 lb (87.1 kg)   SpO2 95%   BMI 35.12 kg/m  Wt Readings from Last 3 Encounters:  12/14/22 192 lb (87.1 kg)  09/14/22 190 lb 3.2 oz (86.3 kg)  08/07/22 190 lb (86.2 kg)    Diabetic Foot Exam - Simple   No data filed    Lab Results  Component Value Date   WBC 5.6 12/14/2022   HGB 13.4 12/14/2022   HCT 41.1 12/14/2022   PLT 228.0 12/14/2022   GLUCOSE 101 (H) 12/14/2022   CHOL 170 12/14/2022   TRIG 122.0 12/14/2022   HDL 53.20 12/14/2022   LDLCALC 92 12/14/2022   ALT 52 (H) 12/14/2022   AST 40 (H) 12/14/2022   NA 141 12/14/2022   K 4.5 12/14/2022   CL 104 12/14/2022   CREATININE 0.65 12/14/2022   BUN 15 12/14/2022   CO2 31 12/14/2022   TSH 1.91  12/14/2022   HGBA1C 6.5 12/14/2022   MICROALBUR 3.06 (H) 05/16/2011    Lab Results  Component Value Date   TSH 1.91 12/14/2022   Lab Results  Component Value Date   WBC 5.6 12/14/2022   HGB 13.4 12/14/2022   HCT 41.1 12/14/2022   MCV 89.8 12/14/2022   PLT 228.0 12/14/2022   Lab Results  Component Value Date   NA 141 12/14/2022   K 4.5 12/14/2022   CO2 31 12/14/2022   GLUCOSE 101 (H) 12/14/2022   BUN 15 12/14/2022   CREATININE 0.65 12/14/2022   BILITOT 0.6 12/14/2022   ALKPHOS 83 12/14/2022   AST 40 (H) 12/14/2022   ALT 52 (H) 12/14/2022   PROT 7.2 12/14/2022   ALBUMIN 4.4 12/14/2022   CALCIUM 10.0 12/14/2022   ANIONGAP 8 04/25/2021   GFR 89.73 12/14/2022   Lab Results  Component Value Date   CHOL 170 12/14/2022   Lab Results  Component Value Date   HDL 53.20 12/14/2022   Lab Results  Component Value Date   LDLCALC 92 12/14/2022   Lab Results  Component Value Date   TRIG 122.0 12/14/2022   Lab Results  Component Value Date   CHOLHDL 3 12/14/2022   Lab Results  Component Value Date   HGBA1C 6.5 12/14/2022       Assessment & Plan:  Hyperlipidemia, mixed Assessment & Plan: Encourage heart healthy diet such as MIND or DASH diet, increase exercise, avoid trans fats, simple carbohydrates and processed foods, consider a krill or fish or flaxseed oil cap daily. Tolerating Rosuvastatin  Orders: -     Lipid panel  Essential hypertension Assessment & Plan: Well controlled, no changes to meds. Encouraged heart healthy diet such as the DASH diet and exercise as tolerated.   Orders: -     Comprehensive metabolic panel -     CBC with Differential/Platelet -     TSH  Vitamin D deficiency Assessment & Plan: Supplement and monitor  Orders: -     VITAMIN D 25 Hydroxy (Vit-D Deficiency, Fractures)  Class 2 severe obesity due to excess calories with serious comorbidity in adult, unspecified BMI (HCC) Assessment & Plan: Encouraged DASH or MIND diet,  decrease po intake and increase exercise as tolerated. Needs 7-8 hours of sleep nightly. Avoid trans fats, eat small, frequent meals every 4-5 hours with lean proteins, complex carbs and healthy fats. Minimize simple carbs, high fat foods and processed foods    Muscle cramps Assessment & Plan: Hydrate and monitor   Orders: -     Magnesium  Hyperglycemia -     Hemoglobin A1c  Other orders -     Meloxicam; Take 1-2 tablets (7.5-15 mg total) by mouth daily as needed for pain.  Dispense: 30 tablet; Refill: 3    Assessment and Plan    Osteoarthritis: Chronic back pain and morning stiffness. Discussed the importance of physical therapy and exercise in managing symptoms  and slowing progression. -Continue physical therapy and exercises. -Consider chair yoga for additional stretching and strengthening.  Plantar Fasciitis: New onset right heel pain, worse with prolonged sitting and upon standing. Discussed the importance of stretching, icing, and appropriate footwear. -Start stretching exercises for calf and foot. -Apply ice and over-the-counter lidocaine gel to the heel. -Ensure footwear has good arch support and a back. -Consider referral to podiatrist or sports medicine if symptoms escalate.  Mildly elevated liver enzymes: Stable mild elevation of AST. Discussed the importance of maintaining a healthy diet and weight, and limiting alcohol and Tylenol intake. -Continue monitoring liver enzymes. -Consider dietary changes, such as the MIND diet.  General Health Maintenance: -Order blood work today. -Follow-up in 3-4 months. -Consider weight loss strategies, such as the MIND diet and regular exercise. -Consider meloxicam for occasional use on bad pain days.         Danise Edge, MD

## 2022-12-14 ENCOUNTER — Ambulatory Visit: Payer: Medicare (Managed Care) | Admitting: Family Medicine

## 2022-12-14 VITALS — BP 126/72 | HR 56 | Temp 98.0°F | Resp 16 | Ht 62.0 in | Wt 192.0 lb

## 2022-12-14 DIAGNOSIS — I10 Essential (primary) hypertension: Secondary | ICD-10-CM | POA: Diagnosis not present

## 2022-12-14 DIAGNOSIS — E559 Vitamin D deficiency, unspecified: Secondary | ICD-10-CM | POA: Diagnosis not present

## 2022-12-14 DIAGNOSIS — R252 Cramp and spasm: Secondary | ICD-10-CM | POA: Diagnosis not present

## 2022-12-14 DIAGNOSIS — E782 Mixed hyperlipidemia: Secondary | ICD-10-CM | POA: Diagnosis not present

## 2022-12-14 DIAGNOSIS — R739 Hyperglycemia, unspecified: Secondary | ICD-10-CM | POA: Diagnosis not present

## 2022-12-14 LAB — COMPREHENSIVE METABOLIC PANEL
ALT: 52 U/L — ABNORMAL HIGH (ref 0–35)
AST: 40 U/L — ABNORMAL HIGH (ref 0–37)
Albumin: 4.4 g/dL (ref 3.5–5.2)
Alkaline Phosphatase: 83 U/L (ref 39–117)
BUN: 15 mg/dL (ref 6–23)
CO2: 31 mEq/L (ref 19–32)
Calcium: 10 mg/dL (ref 8.4–10.5)
Chloride: 104 mEq/L (ref 96–112)
Creatinine, Ser: 0.65 mg/dL (ref 0.40–1.20)
GFR: 89.73 mL/min (ref >60.00)
Glucose, Bld: 101 mg/dL — ABNORMAL HIGH (ref 70–99)
Potassium: 4.5 mEq/L (ref 3.5–5.1)
Sodium: 141 mEq/L (ref 135–145)
Total Bilirubin: 0.6 mg/dL (ref 0.2–1.2)
Total Protein: 7.2 g/dL (ref 6.0–8.3)

## 2022-12-14 LAB — CBC WITH DIFFERENTIAL/PLATELET
Basophils Absolute: 0.1 10*3/uL (ref 0.0–0.1)
Basophils Relative: 1.3 % (ref 0.0–3.0)
Eosinophils Absolute: 0.3 10*3/uL (ref 0.0–0.7)
Eosinophils Relative: 4.6 % (ref 0.0–5.0)
HCT: 41.1 % (ref 36.0–46.0)
Hemoglobin: 13.4 g/dL (ref 12.0–15.0)
Lymphocytes Relative: 44.1 % (ref 12.0–46.0)
Lymphs Abs: 2.5 10*3/uL (ref 0.7–4.0)
MCHC: 32.5 g/dL (ref 30.0–36.0)
MCV: 89.8 fl (ref 78.0–100.0)
Monocytes Absolute: 0.7 10*3/uL (ref 0.1–1.0)
Monocytes Relative: 11.7 % (ref 3.0–12.0)
Neutro Abs: 2.1 10*3/uL (ref 1.4–7.7)
Neutrophils Relative %: 38.3 % — ABNORMAL LOW (ref 43.0–77.0)
Platelets: 228 10*3/uL (ref 150.0–400.0)
RBC: 4.57 Mil/uL (ref 3.87–5.11)
RDW: 14.1 % (ref 11.5–15.5)
WBC: 5.6 10*3/uL (ref 4.0–10.5)

## 2022-12-14 LAB — LIPID PANEL
Cholesterol: 170 mg/dL (ref 0–200)
HDL: 53.2 mg/dL (ref >39.00)
LDL Cholesterol: 92 mg/dL (ref 0–99)
NonHDL: 116.53
Total CHOL/HDL Ratio: 3
Triglycerides: 122 mg/dL (ref 0.0–149.0)
VLDL: 24.4 mg/dL (ref 0.0–40.0)

## 2022-12-14 LAB — HEMOGLOBIN A1C: Hgb A1c MFr Bld: 6.5 % (ref 4.6–6.5)

## 2022-12-14 LAB — VITAMIN D 25 HYDROXY (VIT D DEFICIENCY, FRACTURES): VITD: 29.91 ng/mL — ABNORMAL LOW (ref 30.00–100.00)

## 2022-12-14 LAB — TSH: TSH: 1.91 u[IU]/mL (ref 0.35–5.50)

## 2022-12-14 LAB — MAGNESIUM: Magnesium: 2.3 mg/dL (ref 1.5–2.5)

## 2022-12-14 MED ORDER — MELOXICAM 7.5 MG PO TABS: 7.5000 mg | ORAL_TABLET | Freq: Every day | ORAL | 3 refills | Status: AC | PRN

## 2022-12-14 NOTE — Patient Instructions (Addendum)
Ice, stretch, change foot wear and lidocaine  Chair Yoga  The Blue Zones on Netflixs vv ccc  MIND diet  Heel Spur  A heel spur is a bony growth that forms on the bottom of the heel bone (calcaneus). Heel spurs are common. They often cause inflammation in the plantar fascia, which is the band of tissue that connects the toe bones to the heel bone. When the plantar fascia is inflamed, it is called plantar fasciitis. This may cause pain on the bottom of the foot, near the heel. Many people with plantar fasciitis also have heel spurs. However, spurs are not the cause of plantar fasciitis pain. What are the causes? The exact cause of heel spurs is not known. They may be caused by: Pressure on the heel bone. Bands of tissues that connect muscle to bone (tendons) pulling on the heel bone. What increases the risk? You are more likely to develop this condition if you: Are older than age 31. Are overweight. Have wear-and-tear arthritis (osteoarthritis). Have plantar fascia inflammation. Participate in sports or activities that include a lot of running or jumping. Wear poorly fitted shoes. What are the signs or symptoms? Some people have no symptoms. If you do have symptoms, they may include: Pain in the bottom of your heel. Pain that is worse when you first get out of bed. Pain that gets worse after walking or standing. How is this diagnosed? This condition may be diagnosed based on: Your symptoms and medical history. A physical exam. A foot X-ray. How is this treated? Treatment for this condition depends on how much pain you have. Treatment options may include: Doing stretching exercises and losing weight, if necessary. Wearing specific shoes or inserts inside of shoes (orthotics) for comfort and support. Wearing splints on your feet while you sleep. Splints keep your feet in a position (usually a 90-degree angle) that should prevent and relieve the pain you feel when you first get out of  bed. They also make stretching easier in the morning. Taking over-the-counter medicine to relieve pain, such as NSAIDs. Using high-intensity sound waves to break up the heel spur (extracorporeal shock wave therapy). Getting steroid injections in your heel to reduce inflammation. Having surgery, if your heel spur causes long-term (chronic) pain. Follow these instructions at home:  Activity Avoid activities that cause pain until you recover, or for as long as told by your health care provider. Do stretching exercises as told. Stretch before exercising or being physically active. Managing pain, stiffness, and swelling If directed, put ice on your foot. To do this: Put ice in a plastic bag. Place a towel between your skin and the bag. Leave the ice on for 20 minutes, 2-3 times a day. Remove the ice if your skin turns bright red. This is very important. If you cannot feel pain, heat, or cold, you have a greater risk of damage to the area. Move your toes often to reduce stiffness and swelling. When possible, raise (elevate) your foot above the level of your heart while you are sitting or lying down. General instructions Take over-the-counter and prescription medicines only as told by your health care provider. Wear supportive shoes that fit well. Wear splints, inserts, or orthotics as told by your health care provider. If recommended, work with your health care provider to lose weight. This can relieve pressure on your foot. Do not use any products that contain nicotine or tobacco, such as cigarettes, e-cigarettes, and chewing tobacco. These can delay bone healing. If  you need help quitting, ask your health care provider. Keep all follow-up visits. This is important. Where to find more information American Academy of Orth[employer]: www.orthoinfo.aaos.org Contact a health care provider if: Your pain does not go away with treatment. Your pain gets worse. Summary A heel spur is a bony  growth that forms on the bottom of the heel bone (calcaneus). Heel spurs often cause inflammation in the plantar fascia, which is the band of tissue that connects the toes to the heel bone. This may cause pain on the bottom of the foot, near the heel. Doing stretching exercises, losing weight, wearing specific shoes or shoe inserts, wearing splints while you sleep, and taking pain medicine may ease the pain and stiffness. Other treatment options may include high-intensity sound waves to break up the heel spur, steroid injections, or surgery. This information is not intended to replace advice given to you by your health care provider. Make sure you discuss any questions you have with your health care provider. Document Revised: 09/09/2019 Document Reviewed: 09/09/2019 Elsevier Patient Education  2024 [employer].

## 2022-12-15 ENCOUNTER — Other Ambulatory Visit: Payer: Self-pay

## 2022-12-15 ENCOUNTER — Encounter: Payer: Self-pay | Admitting: Family Medicine

## 2022-12-15 DIAGNOSIS — E559 Vitamin D deficiency, unspecified: Secondary | ICD-10-CM

## 2023-01-09 DIAGNOSIS — G4733 Obstructive sleep apnea (adult) (pediatric): Secondary | ICD-10-CM | POA: Diagnosis not present

## 2023-02-15 ENCOUNTER — Other Ambulatory Visit: Payer: Self-pay | Admitting: Family

## 2023-03-12 ENCOUNTER — Other Ambulatory Visit: Payer: Self-pay | Admitting: Family

## 2023-04-11 DIAGNOSIS — G4733 Obstructive sleep apnea (adult) (pediatric): Secondary | ICD-10-CM | POA: Diagnosis not present

## 2023-04-11 NOTE — Assessment & Plan Note (Signed)
Encouraged DASH or MIND diet, decrease po intake and increase exercise as tolerated. Needs 7-8 hours of sleep nightly. Avoid trans fats, eat small, frequent meals every 4-5 hours with lean proteins, complex carbs and healthy fats. Minimize simple carbs, high fat foods and processed foods 

## 2023-04-11 NOTE — Assessment & Plan Note (Signed)
Well controlled, no changes to meds. Encouraged heart healthy diet such as the DASH diet and exercise as tolerated.  °

## 2023-04-11 NOTE — Assessment & Plan Note (Signed)
Encouraged good sleep hygiene such as dark, quiet room. No blue/green glowing lights such as computer screens in bedroom. No alcohol or stimulants in evening. Cut down on caffeine as able. Regular exercise is helpful but not just prior to bed time.  

## 2023-04-11 NOTE — Assessment & Plan Note (Signed)
Hydrate and monitor 

## 2023-04-11 NOTE — Assessment & Plan Note (Signed)
Supplement and monitor 

## 2023-04-11 NOTE — Assessment & Plan Note (Signed)
hgba1c acceptable, minimize simple carbs. Increase exercise as tolerated.  

## 2023-04-12 ENCOUNTER — Encounter: Payer: Self-pay | Admitting: Family Medicine

## 2023-04-12 ENCOUNTER — Ambulatory Visit: Payer: Medicare (Managed Care) | Admitting: Family Medicine

## 2023-04-12 VITALS — BP 124/62 | HR 52 | Temp 97.8°F | Ht 62.0 in | Wt 190.0 lb

## 2023-04-12 DIAGNOSIS — R739 Hyperglycemia, unspecified: Secondary | ICD-10-CM

## 2023-04-12 DIAGNOSIS — E559 Vitamin D deficiency, unspecified: Secondary | ICD-10-CM

## 2023-04-12 DIAGNOSIS — Z23 Encounter for immunization: Secondary | ICD-10-CM | POA: Diagnosis not present

## 2023-04-12 DIAGNOSIS — E66812 Obesity, class 2: Secondary | ICD-10-CM | POA: Diagnosis not present

## 2023-04-12 DIAGNOSIS — M545 Low back pain, unspecified: Secondary | ICD-10-CM | POA: Diagnosis not present

## 2023-04-12 DIAGNOSIS — G4700 Insomnia, unspecified: Secondary | ICD-10-CM

## 2023-04-12 DIAGNOSIS — R252 Cramp and spasm: Secondary | ICD-10-CM | POA: Diagnosis not present

## 2023-04-12 DIAGNOSIS — I10 Essential (primary) hypertension: Secondary | ICD-10-CM | POA: Diagnosis not present

## 2023-04-12 DIAGNOSIS — R3915 Urgency of urination: Secondary | ICD-10-CM

## 2023-04-12 MED ORDER — TIZANIDINE HCL 2 MG PO TABS: 1.0000 mg | ORAL_TABLET | Freq: Three times a day (TID) | ORAL | 0 refills | Status: DC | PRN

## 2023-04-12 MED ORDER — AMLODIPINE BESYLATE 5 MG PO TABS
ORAL_TABLET | ORAL | 1 refills | Status: DC
Start: 2023-04-12 — End: 2023-09-19

## 2023-04-12 NOTE — Progress Notes (Signed)
Subjective:    Patient ID: Margaret [name], adult    DOB: [birth year], 72 y.o.   MRN: [id number]  Chief Complaint  Patient presents with   Follow-up    HPI Discussed the use of AI scribe software for clinical note transcription with the patient, who gave verbal consent to proceed.  History of Present Illness   The patient, with a known history of osteoarthritis, presents with worsening lower back pain. She describes the pain as being located in the middle of her lower back, occasionally radiating around to her abdomen. The pain is described as overwhelming, particularly in the mornings, and seems to increase with activity throughout the day. The patient manages the pain with regular exercise and stretching, but notes that the pain can be draining and often results in leg pain.  In addition to the back pain, the patient reports an increased urgency to urinate, but only small amounts. She does not report any nocturia, stating that she can sleep through the night without needing to urinate. The patient is unsure if this urinary symptom is related to her osteoarthritis or if it is a separate issue.        Past Medical History:  Diagnosis Date   Allergic state 03/25/2012   Allergy    Aortic calcification (HCC) 08/27/2014   Atherosclerosis of aorta (HCC) 04/12/2014   Back pain    Constipation 01/23/2017   COVID-19 02/21/2021   GERD (gastroesophageal reflux disease)    Hiatal hernia with gastroesophageal reflux 04/27/2010   Qualifier: Diagnosis of  By: Terrilee Croak CMA, Darlene  Failed Omeprazole and Protonix    Hx of adenomatous polyp of colon 01/23/2017   Colonoscopy 01/16/2017 with 2 polyps one was adenomatous. recommeded follow up in 5 years.performed by Dr Lavon Paganini   Hyperlipidemia    Hypertension    Joint pain    Osteopenia 08/31/2012   Peripheral edema 12/13/2014   Sleep apnea    Unspecified sinusitis (chronic) 03/30/2013    Past Surgical History:  Procedure Laterality Date    24 HOUR PH STUDY N/A 12/15/2015   Procedure: 24 HOUR PH STUDY;  Surgeon: Napoleon Form, MD;  Location: [facility];  Service: Endoscopy;  Laterality: N/A;   DILATION AND CURETTAGE OF UTERUS     ESOPHAGEAL MANOMETRY N/A 12/15/2015   Procedure: ESOPHAGEAL MANOMETRY (EM);  Surgeon: Napoleon Form, MD;  Location: [facility];  Service: Endoscopy;  Laterality: N/A;    Family History  Problem Relation Age of Onset   Stroke Mother    High blood pressure Mother    High Cholesterol Mother    Lung cancer Father    Stroke Sister        62   Thyroid disease Sister    Diabetes Sister    Hypertension Sister    Hyperlipidemia Sister    Hypertension Sister    Sleep apnea Son    Obesity Son    Colon cancer Neg Hx    Esophageal cancer Neg Hx    Stomach cancer Neg Hx     Social History   Socioeconomic History   Marital status: Married    Spouse name: Sherilyn Cooter   Number of children: 2   Years of education: 16   Highest education level: Not on file  Occupational History   Occupation: Airline pilot    Comment: retired  Tobacco Use   Smoking status: Never   Smokeless tobacco: Never  Vaping Use   Vaping status: Never Used  Substance and Sexual Activity  Alcohol use: Yes    Alcohol/week: 0.0 standard drinks of alcohol    Comment: Rarely   Drug use: No   Sexual activity: Not Currently    Comment: no dietary restrictions, lives with husand, works at [employer]  Other Topics Concern   Not on file  Social History Narrative   Lives with husband   Caffeine- tea, 2 cups daily   Social Determinants of Health   Financial Resource Strain: Low Risk  (08/01/2022)   Overall Financial Resource Strain (CARDIA)    Difficulty of Paying Living Expenses: Not hard at all  Food Insecurity: No Food Insecurity (08/01/2022)   Hunger Vital Sign    Worried About Running Out of Food in the Last Year: Never true    Ran Out of Food in the Last Year: Never true  Transportation Needs: No Transportation Needs  (08/01/2022)   PRAPARE - Administrator, Civil Service (Medical): No    Lack of Transportation (Non-Medical): No  Physical Activity: Inactive (08/01/2022)   Exercise Vital Sign    Days of Exercise per Week: 0 days    Minutes of Exercise per Session: 0 min  Stress: No Stress Concern Present (08/01/2022)   [employer] of Occupational Health - Occupational Stress Questionnaire    Feeling of Stress : Not at all  Social Connections: Socially Integrated (08/01/2022)   Social Connection and Isolation Panel [NHANES]    Frequency of Communication with Friends and Family: More than three times a week    Frequency of Social Gatherings with Friends and Family: Once a week    Attends Religious Services: More than 4 times per year    Active Member of [employer] or Organizations: Yes    Attends Engineer, structural: More than 4 times per year    Marital Status: Married  Catering manager Violence: Not At Risk (08/01/2022)   Humiliation, Afraid, Rape, and Kick questionnaire    Fear of Current or Ex-Partner: No    Emotionally Abused: No    Physically Abused: No    Sexually Abused: No    Outpatient Medications Prior to Visit  Medication Sig Dispense Refill   aspirin 81 MG chewable tablet Chew by mouth daily.     carvedilol (COREG) 6.25 MG tablet TAKE 1 TABLET(6.25 MG) BY MOUTH TWICE DAILY WITH A MEAL 180 tablet 1   fish oil-omega-3 fatty acids 1000 MG capsule Take 2 g by mouth daily.     lidocaine (LIDODERM) 5 % Place 1 patch onto the skin every 12 (twelve) hours. Remove & Discard patch within 12 hours or as directed by MD 180 each 2   losartan (COZAAR) 25 MG tablet TAKE 1 TABLET(25 MG) BY MOUTH DAILY 90 tablet 1   meloxicam (MOBIC) 7.5 MG tablet Take 1-2 tablets (7.5-15 mg total) by mouth daily as needed for pain. 30 tablet 3   Multiple Vitamin (MULTIVITAMIN WITH MINERALS) TABS tablet Take 1 tablet by mouth daily.     omeprazole (PRILOSEC) 40 MG capsule Take 1 capsule (40 mg total) by  mouth daily. 90 capsule 0   rosuvastatin (CRESTOR) 10 MG tablet TAKE 1 TABLET(10 MG) BY MOUTH DAILY 90 tablet 1   sucralfate (CARAFATE) 1 g tablet TAKE 1 TABLET(1 GRAM) BY MOUTH FOUR TIMES DAILY AT BEDTIME WITH MEALS 368 tablet 1   VITAMIN D PO Take 2,500 Units by mouth daily.     Zinc 50 MG TABS Take 1 tablet by mouth daily.     amLODipine (NORVASC) 5  MG tablet TAKE 1 TABLET(5 MG) BY MOUTH DAILY 90 tablet 1   No facility-administered medications prior to visit.    Allergies  Allergen Reactions   Lisinopril Cough   Potassium-Containing Compounds Rash    Review of Systems  Constitutional:  Negative for fever and malaise/fatigue.  HENT:  Negative for congestion.   Eyes:  Negative for blurred vision.  Respiratory:  Negative for shortness of breath.   Cardiovascular:  Negative for chest pain, palpitations and leg swelling.  Gastrointestinal:  Negative for abdominal pain, blood in stool and nausea.  Genitourinary:  Positive for urgency. Negative for dysuria and frequency.  Musculoskeletal:  Positive for back pain. Negative for falls.  Skin:  Negative for rash.  Neurological:  Negative for dizziness, loss of consciousness and headaches.  Endo/Heme/Allergies:  Negative for environmental allergies.  Psychiatric/Behavioral:  Negative for depression. The patient is not nervous/anxious.        Objective:    Physical Exam Constitutional:      General: She is not in acute distress.    Appearance: Normal appearance. She is well-developed. She is not toxic-appearing.  HENT:     Head: Normocephalic and atraumatic.     Right Ear: External ear normal.     Left Ear: External ear normal.     Nose: Nose normal.  Eyes:     General:        Right eye: No discharge.        Left eye: No discharge.     Conjunctiva/sclera: Conjunctivae normal.  Neck:     Thyroid: No thyromegaly.  Cardiovascular:     Rate and Rhythm: Normal rate and regular rhythm.     Heart sounds: Normal heart sounds. No  murmur heard. Pulmonary:     Effort: Pulmonary effort is normal. No respiratory distress.     Breath sounds: Normal breath sounds.  Abdominal:     General: Bowel sounds are normal.     Palpations: Abdomen is soft.     Tenderness: There is no abdominal tenderness. There is no guarding.  Musculoskeletal:        General: Normal range of motion.     Cervical back: Neck supple.  Lymphadenopathy:     Cervical: No cervical adenopathy.  Skin:    General: Skin is warm and dry.  Neurological:     Mental Status: She is alert and oriented to person, place, and time.  Psychiatric:        Mood and Affect: Mood normal.        Behavior: Behavior normal.        Thought Content: Thought content normal.        Judgment: Judgment normal.     BP 124/62 (BP Location: Left Arm, Patient Position: Sitting, Cuff Size: Normal)   Pulse (!) 52   Temp 97.8 F (36.6 C) (Oral)   Ht 5\' 2"  (1.575 m)   Wt 190 lb (86.2 kg)   SpO2 96%   BMI 34.75 kg/m  Wt Readings from Last 3 Encounters:  04/12/23 190 lb (86.2 kg)  12/14/22 192 lb (87.1 kg)  09/14/22 190 lb 3.2 oz (86.3 kg)    Diabetic Foot Exam - Simple   No data filed    Lab Results  Component Value Date   WBC 5.6 12/14/2022   HGB 13.4 12/14/2022   HCT 41.1 12/14/2022   PLT 228.0 12/14/2022   GLUCOSE 101 (H) 12/14/2022   CHOL 170 12/14/2022   TRIG 122.0 12/14/2022   HDL  53.20 12/14/2022   LDLCALC 92 12/14/2022   ALT 52 (H) 12/14/2022   AST 40 (H) 12/14/2022   NA 141 12/14/2022   K 4.5 12/14/2022   CL 104 12/14/2022   CREATININE 0.65 12/14/2022   BUN 15 12/14/2022   CO2 31 12/14/2022   TSH 1.91 12/14/2022   HGBA1C 6.5 12/14/2022   MICROALBUR 3.06 (H) 05/16/2011    Lab Results  Component Value Date   TSH 1.91 12/14/2022   Lab Results  Component Value Date   WBC 5.6 12/14/2022   HGB 13.4 12/14/2022   HCT 41.1 12/14/2022   MCV 89.8 12/14/2022   PLT 228.0 12/14/2022   Lab Results  Component Value Date   NA 141 12/14/2022    K 4.5 12/14/2022   CO2 31 12/14/2022   GLUCOSE 101 (H) 12/14/2022   BUN 15 12/14/2022   CREATININE 0.65 12/14/2022   BILITOT 0.6 12/14/2022   ALKPHOS 83 12/14/2022   AST 40 (H) 12/14/2022   ALT 52 (H) 12/14/2022   PROT 7.2 12/14/2022   ALBUMIN 4.4 12/14/2022   CALCIUM 10.0 12/14/2022   ANIONGAP 8 04/25/2021   GFR 89.73 12/14/2022   Lab Results  Component Value Date   CHOL 170 12/14/2022   Lab Results  Component Value Date   HDL 53.20 12/14/2022   Lab Results  Component Value Date   LDLCALC 92 12/14/2022   Lab Results  Component Value Date   TRIG 122.0 12/14/2022   Lab Results  Component Value Date   CHOLHDL 3 12/14/2022   Lab Results  Component Value Date   HGBA1C 6.5 12/14/2022       Assessment & Plan:  Essential hypertension Assessment & Plan: Well controlled, no changes to meds. Encouraged heart healthy diet such as the DASH diet and exercise as tolerated.   Orders: -     amLODIPine Besylate; TAKE 1 TABLET(5 MG) BY MOUTH DAILY  Dispense: 90 tablet; Refill: 1 -     Lipid panel -     Comprehensive metabolic panel -     CBC with Differential/Platelet -     TSH  Hyperglycemia Assessment & Plan: hgba1c acceptable, minimize simple carbs. Increase exercise as tolerated.   Orders: -     Lipid panel -     Hemoglobin A1c  Insomnia, unspecified type Assessment & Plan: Encouraged good sleep hygiene such as dark, quiet room. No blue/green glowing lights such as computer screens in bedroom. No alcohol or stimulants in evening. Cut down on caffeine as able. Regular exercise is helpful but not just prior to bed time.    Muscle cramps Assessment & Plan: Hydrate and monitor   Orders: -     Magnesium  Class 2 severe obesity due to excess calories with serious comorbidity in adult, unspecified BMI (HCC) Assessment & Plan: Encouraged DASH or MIND diet, decrease po intake and increase exercise as tolerated. Needs 7-8 hours of sleep nightly. Avoid trans fats,  eat small, frequent meals every 4-5 hours with lean proteins, complex carbs and healthy fats. Minimize simple carbs, high fat foods and processed foods    Vitamin D deficiency Assessment & Plan: Supplement and monitor  Orders: -     Vitamin D 1,25 dihydroxy  Low back pain, unspecified back pain laterality, unspecified chronicity, unspecified whether sciatica present -     DG Lumbar Spine Complete; Future  Urinary urgency -     Urinalysis, Routine w reflex microscopic -     Urine Culture  Need for  influenza vaccination -     Flu Vaccine Trivalent High Dose (Fluad)  Other orders -     tiZANidine HCl; Take 0.5-2 tablets (1-4 mg total) by mouth every 8 (eight) hours as needed for muscle spasms.  Dispense: 30 tablet; Refill: 0    Assessment and Plan    Lower Back Pain Chronic pain due to osteoarthritis, with recent worsening and new radiation to the anterior abdomen. Pain is manageable with exercise and stretching, but is becoming more draining and overwhelming. -Order X-ray of the lower back to assess for changes since last imaging two years ago. -Prescribe Tizanidine 2mg  tablets, up to 4mg  as needed for severe pain. -Consider MRI if symptoms persist or worsen after X-ray results.  Urinary Symptoms New onset of urgency and frequency, with small volume voids. No nocturia. -Order urinalysis to assess for possible urinary tract infection. -Consider referral to urology if symptoms persist and no cause is identified from urinalysis.  Immunizations -Administer influenza vaccine today. -Recommend Tetanus vaccine, to be administered at the pharmacy. -Recommend Prevnar 20 (pneumonia vaccine) at the pharmacy.  Follow-up in 4 months for annual physical.         Danise Edge, MD

## 2023-04-12 NOTE — Patient Instructions (Addendum)
Tetanus is due at pharmacy  Annual COVID and flu vaccines each fall  Prevnar 20 daily  Chronic Back Pain Chronic back pain is back pain that lasts longer than 3 months. The cause of your back pain may not be known. Some common causes include: Wear and tear (degenerative disease) of the bones, disks, or tissues that connect bones to each other (ligaments) in your back. Inflammation and stiffness in your back (arthritis). If you have chronic back pain, you may have times when the pain is more intense (flare-ups). You can also learn to manage the pain with home care. Follow these instructions at home: Watch for any changes in your symptoms. Take these actions to help with your pain: Managing pain and stiffness     If told, put ice on the painful area. You may be told to apply ice for the first 24-48 hours after a flare-up starts. Put ice in a plastic bag. Place a towel between your skin and the bag. Leave the ice on for 20 minutes, 2-3 times per day. If told, apply heat to the affected area as often as told by your health care provider. Use the heat source that your provider recommends, such as a moist heat pack or a heating pad. Place a towel between your skin and the heat source. Leave the heat on for 20-30 minutes. If your skin turns bright red, remove the ice or heat right away to prevent skin damage. The risk of damage is higher if you cannot feel pain, heat, or cold. Try soaking in a warm tub. Activity        Avoid bending and other activities that make the pain worse. Have good posture when you stand or sit. When you stand, keep your upper back and neck straight, with your shoulders pulled back. Avoid slouching. When you sit, keep your back straight. Relax your shoulders. Do not round your shoulders or pull them backward. Do not sit or stand in one place for too long. Take brief periods of rest during the day. This will reduce your pain. Resting in a lying or standing  position is often better than sitting to rest. When you rest for longer periods, mix in some mild activity or stretching between periods of rest. This will help to prevent stiffness and pain. Get regular exercise. Ask your provider what activities are safe for you. You may have to avoid lifting. Ask your provider how much you can safely lift. If you do lift, always use the right technique. This means you should: Bend your knees. Keep the load close to your body. Avoid twisting. Medicines Take over-the-counter and prescription medicines only as told by your provider. You may need to take medicines for pain and inflammation. These may be taken by mouth or put on the skin. You may also be given muscle relaxants. Ask your provider if the medicine prescribed to you: Requires you to avoid driving or using machinery. Can cause constipation. You may need to take these actions to prevent or treat constipation: Drink enough fluid to keep your pee (urine) pale yellow. Take over-the-counter or prescription medicines. Eat foods that are high in fiber, such as beans, whole grains, and fresh fruits and vegetables. Limit foods that are high in fat and processed sugars, such as fried or sweet foods. General instructions  Sleep on a firm mattress in a comfortable position. Try lying on your side with your knees slightly bent. If you lie on your back, put a pillow under  your knees. Do not use any products that contain nicotine or tobacco. These products include cigarettes, chewing tobacco, and vaping devices, such as e-cigarettes. If you need help quitting, ask your provider. Contact a health care provider if: You have pain that does not get better with rest or medicine. You have new pain. You have a fever. You lose weight quickly. You have trouble doing your normal activities. You feel weak or numb in one or both of your legs or feet. Get help right away if: You are not able to control when you pee or  poop. You have severe back pain and: Nausea or vomiting. Pain in your chest or abdomen. Shortness of breath. You faint. These symptoms may be an emergency. Get help right away. Call 911. Do not wait to see if the symptoms will go away. Do not drive yourself to the hospital. This information is not intended to replace advice given to you by your health care provider. Make sure you discuss any questions you have with your health care provider. Document Revised: 01/02/2022 Document Reviewed: 01/02/2022 Elsevier Patient Education  2024 [employer].

## 2023-04-13 LAB — URINALYSIS, ROUTINE W REFLEX MICROSCOPIC
Bilirubin Urine: NEGATIVE
Hgb urine dipstick: NEGATIVE
Ketones, ur: NEGATIVE
Leukocytes,Ua: NEGATIVE
Nitrite: NEGATIVE
RBC / HPF: NONE SEEN (ref 0–?)
Specific Gravity, Urine: <=1.005 — AB (ref 1.000–1.030)
Total Protein, Urine: NEGATIVE
Urine Glucose: NEGATIVE
Urobilinogen, UA: 0.2 (ref 0.0–1.0)
pH: 7 (ref 5.0–8.0)

## 2023-04-13 LAB — COMPREHENSIVE METABOLIC PANEL
ALT: 37 U/L — ABNORMAL HIGH (ref 0–35)
AST: 34 U/L (ref 0–37)
Albumin: 4.7 g/dL (ref 3.5–5.2)
Alkaline Phosphatase: 84 U/L (ref 39–117)
BUN: 14 mg/dL (ref 6–23)
CO2: 31 meq/L (ref 19–32)
Calcium: 10 mg/dL (ref 8.4–10.5)
Chloride: 103 meq/L (ref 96–112)
Creatinine, Ser: 0.66 mg/dL (ref 0.40–1.20)
GFR: 89.19 mL/min (ref >60.00)
Glucose, Bld: 72 mg/dL (ref 70–99)
Potassium: 4.1 meq/L (ref 3.5–5.1)
Sodium: 142 meq/L (ref 135–145)
Total Bilirubin: 0.5 mg/dL (ref 0.2–1.2)
Total Protein: 7.5 g/dL (ref 6.0–8.3)

## 2023-04-13 LAB — CBC WITH DIFFERENTIAL/PLATELET
Basophils Absolute: 0.1 10*3/uL (ref 0.0–0.1)
Basophils Relative: 1.2 % (ref 0.0–3.0)
Eosinophils Absolute: 0.2 10*3/uL (ref 0.0–0.7)
Eosinophils Relative: 4.4 % (ref 0.0–5.0)
HCT: 41.8 % (ref 36.0–46.0)
Hemoglobin: 13.6 g/dL (ref 12.0–15.0)
Lymphocytes Relative: 38.7 % (ref 12.0–46.0)
Lymphs Abs: 1.9 10*3/uL (ref 0.7–4.0)
MCHC: 32.7 g/dL (ref 30.0–36.0)
MCV: 89.8 fL (ref 78.0–100.0)
Monocytes Absolute: 0.6 10*3/uL (ref 0.1–1.0)
Monocytes Relative: 11.5 % (ref 3.0–12.0)
Neutro Abs: 2.2 10*3/uL (ref 1.4–7.7)
Neutrophils Relative %: 44.2 % (ref 43.0–77.0)
Platelets: 225 10*3/uL (ref 150.0–400.0)
RBC: 4.65 Mil/uL (ref 3.87–5.11)
RDW: 14.2 % (ref 11.5–15.5)
WBC: 5 10*3/uL (ref 4.0–10.5)

## 2023-04-13 LAB — MAGNESIUM: Magnesium: 2.4 mg/dL (ref 1.5–2.5)

## 2023-04-13 LAB — URINE CULTURE
MICRO NUMBER:: 15731303
Result:: NO GROWTH
SPECIMEN QUALITY:: ADEQUATE

## 2023-04-13 LAB — TSH: TSH: 2.11 u[IU]/mL (ref 0.35–5.50)

## 2023-04-13 LAB — LIPID PANEL
Cholesterol: 183 mg/dL (ref 0–200)
HDL: 50.2 mg/dL (ref >39.00)
LDL Cholesterol: 107 mg/dL — ABNORMAL HIGH (ref 0–99)
NonHDL: 132.64
Total CHOL/HDL Ratio: 4
Triglycerides: 128 mg/dL (ref 0.0–149.0)
VLDL: 25.6 mg/dL (ref 0.0–40.0)

## 2023-04-13 LAB — HEMOGLOBIN A1C: Hgb A1c MFr Bld: 6.5 % (ref 4.6–6.5)

## 2023-04-17 ENCOUNTER — Ambulatory Visit (HOSPITAL_BASED_OUTPATIENT_CLINIC_OR_DEPARTMENT_OTHER)
Admission: RE | Admit: 2023-04-17 | Discharge: 2023-04-17 | Disposition: A | Discharge status: Home / Self Care | Payer: Medicare (Managed Care) | Source: Ambulatory Visit | Attending: Family Medicine | Admitting: Family Medicine

## 2023-04-17 DIAGNOSIS — M5127 Other intervertebral disc displacement, lumbosacral region: Secondary | ICD-10-CM | POA: Diagnosis not present

## 2023-04-17 DIAGNOSIS — I700 Atherosclerosis of aorta: Secondary | ICD-10-CM | POA: Diagnosis not present

## 2023-04-17 DIAGNOSIS — M545 Low back pain, unspecified: Secondary | ICD-10-CM | POA: Insufficient documentation

## 2023-04-17 DIAGNOSIS — M8580 Other specified disorders of bone density and structure, unspecified site: Secondary | ICD-10-CM | POA: Diagnosis not present

## 2023-04-17 DIAGNOSIS — M47816 Spondylosis without myelopathy or radiculopathy, lumbar region: Secondary | ICD-10-CM | POA: Diagnosis not present

## 2023-04-17 LAB — VITAMIN D 1,25 DIHYDROXY
Vitamin D 1, 25 (OH)2 Total: 42 pg/mL (ref 18–72)
Vitamin D2 1, 25 (OH)2: <8 pg/mL
Vitamin D3 1, 25 (OH)2: 42 pg/mL

## 2023-04-21 DIAGNOSIS — I959 Hypotension, unspecified: Secondary | ICD-10-CM | POA: Diagnosis not present

## 2023-04-21 DIAGNOSIS — R110 Nausea: Secondary | ICD-10-CM | POA: Diagnosis not present

## 2023-04-21 DIAGNOSIS — R55 Syncope and collapse: Secondary | ICD-10-CM | POA: Diagnosis not present

## 2023-04-21 DIAGNOSIS — I10 Essential (primary) hypertension: Secondary | ICD-10-CM | POA: Diagnosis not present

## 2023-04-21 DIAGNOSIS — R519 Headache, unspecified: Secondary | ICD-10-CM | POA: Diagnosis not present

## 2023-04-21 DIAGNOSIS — R42 Dizziness and giddiness: Secondary | ICD-10-CM | POA: Diagnosis not present

## 2023-04-23 ENCOUNTER — Telehealth: Payer: Self-pay | Admitting: *Deleted

## 2023-04-23 NOTE — Transitions of Care (Post Inpatient/ED Visit) (Signed)
   04/23/2023  Name: Margaret [name] MRN: [id number] DOB: Mar 25, 1951  Today's TOC FU Call Status: Today's TOC FU Call Status:: Unsuccessful Call (1st Attempt) Unsuccessful Call (1st Attempt) Date: 04/23/23  Attempted to reach the patient regarding the most recent Inpatient/ED visit.  Follow Up Plan: Additional outreach attempts will be made to reach the patient to complete the Transitions of Care (Post Inpatient/ED visit) call.   Signature Antonia Culbertson, [facility]

## 2023-04-24 ENCOUNTER — Ambulatory Visit (INDEPENDENT_AMBULATORY_CARE_PROVIDER_SITE_OTHER): Payer: Medicare (Managed Care) | Admitting: Family Medicine

## 2023-04-24 ENCOUNTER — Telehealth: Payer: Self-pay | Admitting: *Deleted

## 2023-04-24 VITALS — BP 126/60 | HR 69 | Temp 98.2°F | Resp 16 | Ht 62.0 in | Wt 191.0 lb

## 2023-04-24 DIAGNOSIS — R42 Dizziness and giddiness: Secondary | ICD-10-CM

## 2023-04-24 DIAGNOSIS — H6993 Unspecified Eustachian tube disorder, bilateral: Secondary | ICD-10-CM

## 2023-04-24 MED ORDER — MECLIZINE HCL 25 MG PO TABS: 25.0000 mg | ORAL_TABLET | Freq: Three times a day (TID) | ORAL | 1 refills | Status: AC

## 2023-04-24 NOTE — Patient Instructions (Addendum)
Hydrate 10 every 1-2 hours Protein in every 3-4 hours Move slowly  Mounjaro or Trulicity or Ozempic  Let us know if you want to try

## 2023-04-24 NOTE — Transitions of Care (Post Inpatient/ED Visit) (Signed)
   04/24/2023  Name: Margaret [name] MRN: [id number] DOB: 1951/03/03  Today's TOC FU Call Status: Today's TOC FU Call Status:: Unsuccessful Call (2nd Attempt) Unsuccessful Call (2nd Attempt) Date: 04/24/23  Attempted to reach the patient regarding the most recent Inpatient/ED visit.  Follow Up Plan: Additional outreach attempts will be made to reach the patient to complete the Transitions of Care (Post Inpatient/ED visit) call.   Signature Delta Pichon, [facility]

## 2023-04-25 ENCOUNTER — Telehealth: Payer: Self-pay | Admitting: *Deleted

## 2023-04-25 NOTE — Progress Notes (Signed)
Subjective:    Patient ID: Margaret [name], adult    DOB: [birth year], 72 y.o.   MRN: [id number]  Chief Complaint  Patient presents with  . Follow-up    ED    HPI Discussed the use of AI scribe software for clinical note transcription with the patient, who gave verbal consent to proceed.  History of Present Illness   The patient, with a history of dizziness, presents after a recent episode of vertigo. They describe a sudden onset of spinning sensation after bending over quickly, which was severe enough to warrant a visit to the ER. The patient denies any preceding illness or falls. They report feeling 'oozy' and not quite right in the days leading up to the episode, but otherwise had no specific symptoms. Since the ER visit, they have been taking meclizine, which has provided some relief. They also mention feeling 'oozy' and tired, which they attribute to lack of sleep and possibly overexertion. The patient also reports a sensation of their eardrum 'popping' prior to the vertigo episode, suggesting possible Eustachian tube dysfunction.        Past Medical History:  Diagnosis Date  . Allergic state 03/25/2012  . Allergy   . Aortic calcification (HCC) 08/27/2014  . Atherosclerosis of aorta (HCC) 04/12/2014  . Back pain   . Constipation 01/23/2017  . COVID-19 02/21/2021  . GERD (gastroesophageal reflux disease)   . Hiatal hernia with gastroesophageal reflux 04/27/2010   Qualifier: Diagnosis of  By: Terrilee Croak CMA, Darlene  Failed Omeprazole and Protonix   . Hx of adenomatous polyp of colon 01/23/2017   Colonoscopy 01/16/2017 with 2 polyps one was adenomatous. recommeded follow up in 5 years.performed by Dr Lavon Paganini  . Hyperlipidemia   . Hypertension   . Joint pain   . Osteopenia 08/31/2012  . Peripheral edema 12/13/2014  . Sleep apnea   . Unspecified sinusitis (chronic) 03/30/2013    Past Surgical History:  Procedure Laterality Date  . 24 HOUR PH STUDY N/A 12/15/2015    Procedure: 24 HOUR PH STUDY;  Surgeon: Napoleon Form, MD;  Location: [facility];  Service: Endoscopy;  Laterality: N/A;  . DILATION AND CURETTAGE OF UTERUS    . ESOPHAGEAL MANOMETRY N/A 12/15/2015   Procedure: ESOPHAGEAL MANOMETRY (EM);  Surgeon: Napoleon Form, MD;  Location: [facility];  Service: Endoscopy;  Laterality: N/A;    Family History  Problem Relation Age of Onset  . Stroke Mother   . High blood pressure Mother   . High Cholesterol Mother   . Lung cancer Father   . Stroke Sister        76  . Thyroid disease Sister   . Diabetes Sister   . Hypertension Sister   . Hyperlipidemia Sister   . Hypertension Sister   . Sleep apnea Son   . Obesity Son   . Colon cancer Neg Hx   . Esophageal cancer Neg Hx   . Stomach cancer Neg Hx     Social History   Socioeconomic History  . Marital status: Married    Spouse name: Sherilyn Cooter  . Number of children: 2  . Years of education: 26  . Highest education level: Not on file  Occupational History  . Occupation: Airline pilot    Comment: retired  Tobacco Use  . Smoking status: Never  . Smokeless tobacco: Never  Vaping Use  . Vaping status: Never Used  Substance and Sexual Activity  . Alcohol use: Yes    Alcohol/week: 0.0 standard drinks of  alcohol    Comment: Rarely  . Drug use: No  . Sexual activity: Not Currently    Comment: no dietary restrictions, lives with husand, works at [employer]  Other Topics Concern  . Not on file  Social History Narrative   Lives with husband   Caffeine- tea, 2 cups daily   Social Determinants of Health   Financial Resource Strain: Low Risk  (08/01/2022)   Overall Financial Resource Strain (CARDIA)   . Difficulty of Paying Living Expenses: Not hard at all  Food Insecurity: No Food Insecurity (08/01/2022)   Hunger Vital Sign   . Worried About Programme researcher, broadcasting/film/video in the Last Year: Never true   . Ran Out of Food in the Last Year: Never true  Transportation Needs: No Transportation Needs  (08/01/2022)   PRAPARE - Transportation   . Lack of Transportation (Medical): No   . Lack of Transportation (Non-Medical): No  Physical Activity: Inactive (08/01/2022)   Exercise Vital Sign   . Days of Exercise per Week: 0 days   . Minutes of Exercise per Session: 0 min  Stress: No Stress Concern Present (08/01/2022)   [employer] of Occupational Health - Occupational Stress Questionnaire   . Feeling of Stress : Not at all  Social Connections: Socially Integrated (08/01/2022)   Social Connection and Isolation Panel [NHANES]   . Frequency of Communication with Friends and Family: More than three times a week   . Frequency of Social Gatherings with Friends and Family: Once a week   . Attends Religious Services: More than 4 times per year   . Active Member of Clubs or Organizations: Yes   . Attends Banker Meetings: More than 4 times per year   . Marital Status: Married  Catering manager Violence: Not At Risk (08/01/2022)   Humiliation, Afraid, Rape, and Kick questionnaire   . Fear of Current or Ex-Partner: No   . Emotionally Abused: No   . Physically Abused: No   . Sexually Abused: No    Outpatient Medications Prior to Visit  Medication Sig Dispense Refill  . amLODipine (NORVASC) 5 MG tablet TAKE 1 TABLET(5 MG) BY MOUTH DAILY 90 tablet 1  . aspirin 81 MG chewable tablet Chew by mouth daily.    . carvedilol (COREG) 6.25 MG tablet TAKE 1 TABLET(6.25 MG) BY MOUTH TWICE DAILY WITH A MEAL 180 tablet 1  . fish oil-omega-3 fatty acids 1000 MG capsule Take 2 g by mouth daily.    Marland Kitchen losartan (COZAAR) 25 MG tablet TAKE 1 TABLET(25 MG) BY MOUTH DAILY 90 tablet 1  . Multiple Vitamin (MULTIVITAMIN WITH MINERALS) TABS tablet Take 1 tablet by mouth daily.    Marland Kitchen omeprazole (PRILOSEC) 40 MG capsule Take 1 capsule (40 mg total) by mouth daily. 90 capsule 0  . rosuvastatin (CRESTOR) 10 MG tablet TAKE 1 TABLET(10 MG) BY MOUTH DAILY 90 tablet 1  . VITAMIN D PO Take 2,500 Units by mouth daily.     . Zinc 50 MG TABS Take 1 tablet by mouth daily.    . meclizine (ANTIVERT) 25 MG tablet Take 1 tablet by mouth 3 (three) times daily.    Marland Kitchen lidocaine (LIDODERM) 5 % Place 1 patch onto the skin every 12 (twelve) hours. Remove & Discard patch within 12 hours or as directed by MD (Patient not taking: Reported on 04/24/2023) 180 each 2  . meloxicam (MOBIC) 7.5 MG tablet Take 1-2 tablets (7.5-15 mg total) by mouth daily as needed for pain. (Patient  not taking: Reported on 04/24/2023) 30 tablet 3  . sucralfate (CARAFATE) 1 g tablet TAKE 1 TABLET(1 GRAM) BY MOUTH FOUR TIMES DAILY AT BEDTIME WITH MEALS (Patient not taking: Reported on 04/24/2023) 368 tablet 1  . tiZANidine (ZANAFLEX) 2 MG tablet Take 0.5-2 tablets (1-4 mg total) by mouth every 8 (eight) hours as needed for muscle spasms. (Patient not taking: Reported on 04/24/2023) 30 tablet 0   No facility-administered medications prior to visit.    Allergies  Allergen Reactions  . Lisinopril Cough  . Potassium-Containing Compounds Rash    Review of Systems  Constitutional:  Negative for fever and malaise/fatigue.  HENT:  Negative for congestion.   Eyes:  Negative for blurred vision.  Respiratory:  Negative for shortness of breath.   Cardiovascular:  Negative for chest pain, palpitations and leg swelling.  Gastrointestinal:  Negative for abdominal pain, blood in stool and nausea.  Genitourinary:  Negative for dysuria and frequency.  Musculoskeletal:  Negative for falls.  Skin:  Negative for rash.  Neurological:  Positive for dizziness. Negative for loss of consciousness and headaches.  Endo/Heme/Allergies:  Negative for environmental allergies.  Psychiatric/Behavioral:  Negative for depression. The patient is not nervous/anxious.        Objective:    Physical Exam Constitutional:      General: She is not in acute distress.    Appearance: Normal appearance. She is well-developed. She is not toxic-appearing.  HENT:     Head:  Normocephalic and atraumatic.     Right Ear: External ear normal.     Left Ear: External ear normal.     Nose: Nose normal.  Eyes:     General:        Right eye: No discharge.        Left eye: No discharge.     Conjunctiva/sclera: Conjunctivae normal.  Neck:     Thyroid: No thyromegaly.  Cardiovascular:     Rate and Rhythm: Normal rate and regular rhythm.     Heart sounds: Normal heart sounds. No murmur heard. Pulmonary:     Effort: Pulmonary effort is normal. No respiratory distress.     Breath sounds: Normal breath sounds.  Abdominal:     General: Bowel sounds are normal.     Palpations: Abdomen is soft.     Tenderness: There is no abdominal tenderness. There is no guarding.  Musculoskeletal:        General: Normal range of motion.     Cervical back: Neck supple.  Lymphadenopathy:     Cervical: No cervical adenopathy.  Skin:    General: Skin is warm and dry.  Neurological:     Mental Status: She is alert and oriented to person, place, and time.  Psychiatric:        Mood and Affect: Mood normal.        Behavior: Behavior normal.        Thought Content: Thought content normal.        Judgment: Judgment normal.    BP 126/60 (BP Location: Left Arm, Patient Position: Sitting, Cuff Size: Large)   Pulse 69   Temp 98.2 F (36.8 C) (Oral)   Resp 16   Ht 5\' 2"  (1.575 m)   Wt 191 lb (86.6 kg)   SpO2 95%   BMI 34.93 kg/m  Wt Readings from Last 3 Encounters:  04/24/23 191 lb (86.6 kg)  04/12/23 190 lb (86.2 kg)  12/14/22 192 lb (87.1 kg)    Diabetic Foot Exam - Simple  No data filed    Lab Results  Component Value Date   WBC 5.0 04/12/2023   HGB 13.6 04/12/2023   HCT 41.8 04/12/2023   PLT 225.0 04/12/2023   GLUCOSE 72 04/12/2023   CHOL 183 04/12/2023   TRIG 128.0 04/12/2023   HDL 50.20 04/12/2023   LDLCALC 107 (H) 04/12/2023   ALT 37 (H) 04/12/2023   AST 34 04/12/2023   NA 142 04/12/2023   K 4.1 04/12/2023   CL 103 04/12/2023   CREATININE 0.66 04/12/2023    BUN 14 04/12/2023   CO2 31 04/12/2023   TSH 2.11 04/12/2023   HGBA1C 6.5 04/12/2023   MICROALBUR 3.06 (H) 05/16/2011    Lab Results  Component Value Date   TSH 2.11 04/12/2023   Lab Results  Component Value Date   WBC 5.0 04/12/2023   HGB 13.6 04/12/2023   HCT 41.8 04/12/2023   MCV 89.8 04/12/2023   PLT 225.0 04/12/2023   Lab Results  Component Value Date   NA 142 04/12/2023   K 4.1 04/12/2023   CO2 31 04/12/2023   GLUCOSE 72 04/12/2023   BUN 14 04/12/2023   CREATININE 0.66 04/12/2023   BILITOT 0.5 04/12/2023   ALKPHOS 84 04/12/2023   AST 34 04/12/2023   ALT 37 (H) 04/12/2023   PROT 7.5 04/12/2023   ALBUMIN 4.7 04/12/2023   CALCIUM 10.0 04/12/2023   ANIONGAP 8 04/25/2021   GFR 89.19 04/12/2023   Lab Results  Component Value Date   CHOL 183 04/12/2023   Lab Results  Component Value Date   HDL 50.20 04/12/2023   Lab Results  Component Value Date   LDLCALC 107 (H) 04/12/2023   Lab Results  Component Value Date   TRIG 128.0 04/12/2023   Lab Results  Component Value Date   CHOLHDL 4 04/12/2023   Lab Results  Component Value Date   HGBA1C 6.5 04/12/2023       Assessment & Plan:  ETD (Eustachian tube dysfunction), bilateral -     Ambulatory referral to ENT  Vertigo -     Ambulatory referral to ENT  Other orders -     Meclizine HCl; Take 1 tablet (25 mg total) by mouth 3 (three) times daily.  Dispense: 30 tablet; Refill: 1    Assessment and Plan    Vertigo Recent episode of vertigo leading to ER visit. CT scan and blood work were normal. No signs of central vertigo on examination. Currently managed with Meclizine as needed. -Continue Meclizine as needed for vertigo symptoms. -Refer to ENT for further evaluation. -Advise on hydration, protein intake, and slow movements to manage symptoms.  Eustachian Tube Dysfunction Reports of ear popping and feeling of fluid in the ear. Examination shows some irritation on the eardrum. -Start Flonase  once daily and nasal saline in the morning to help open Eustachian tubes.  Shortness of Breath on Exertion Reports of shortness of breath when climbing stairs. No other associated symptoms. -Consider physical therapy for deconditioning. -Discuss potential initiation of diabetes medication (Ozempic or Trulicity) for weight loss and blood sugar control after the holidays.   -Consider initiation of diabetes medication (Ozempic or Trulicity) for weight loss and blood sugar control after the holidays.  General Health Maintenance -Advise to get COVID-19 booster shot after returning from trip. -Schedule follow-up appointment in three months.         Danise Edge, MD

## 2023-04-25 NOTE — Transitions of Care (Post Inpatient/ED Visit) (Signed)
   04/25/2023  Name: Margaret [name] MRN: [id number] DOB: 11/18/1950  Today's TOC FU Call Status: Today's TOC FU Call Status:: Unsuccessful Call (3rd Attempt) Unsuccessful Call (3rd Attempt) Date: 04/25/23  Attempted to reach the patient regarding the most recent Inpatient/ED visit.  Follow Up Plan: No further outreach attempts will be made at this time. We have been unable to contact the patient.  Signature Maryellen Dowdle, [facility]

## 2023-05-07 ENCOUNTER — Encounter (INDEPENDENT_AMBULATORY_CARE_PROVIDER_SITE_OTHER): Payer: Self-pay | Admitting: Otolaryngology

## 2023-05-21 ENCOUNTER — Encounter (INDEPENDENT_AMBULATORY_CARE_PROVIDER_SITE_OTHER): Payer: Self-pay

## 2023-05-31 DIAGNOSIS — Z758 Other problems related to medical facilities and other health care: Secondary | ICD-10-CM | POA: Diagnosis not present

## 2023-05-31 DIAGNOSIS — Z7982 Long term (current) use of aspirin: Secondary | ICD-10-CM | POA: Diagnosis not present

## 2023-05-31 DIAGNOSIS — S32018A Other fracture of first lumbar vertebra, initial encounter for closed fracture: Secondary | ICD-10-CM | POA: Diagnosis not present

## 2023-06-01 ENCOUNTER — Telehealth: Payer: Self-pay | Admitting: *Deleted

## 2023-06-01 NOTE — Transitions of Care (Post Inpatient/ED Visit) (Signed)
   06/01/2023  Name: Margaret [name] MRN: [id number] DOB: 10/04/1950  Today's TOC FU Call Status: Today's TOC FU Call Status:: Unsuccessful Call (1st Attempt) Unsuccessful Call (1st Attempt) Date: 06/01/23  Attempted to reach the patient regarding the most recent Inpatient/ED visit.  Follow Up Plan: Additional outreach attempts will be made to reach the patient to complete the Transitions of Care (Post Inpatient/ED visit) call.   Signature Mynor Witkop, [facility]

## 2023-06-04 ENCOUNTER — Telehealth: Payer: Self-pay | Admitting: *Deleted

## 2023-06-04 NOTE — Transitions of Care (Post Inpatient/ED Visit) (Signed)
   06/04/2023  Name: Shayle Icenhower MRN: [id number] DOB: 18-May-1951  Today's TOC FU Call Status: Today's TOC FU Call Status:: Unsuccessful Call (2nd Attempt) Unsuccessful Call (2nd Attempt) Date: 06/04/23  Attempted to reach the patient regarding the most recent Inpatient/ED visit.  Follow Up Plan: No further outreach attempts will be made at this time. We have been unable to contact the patient.  Signature Kimmarie Pascale, [facility]

## 2023-06-18 ENCOUNTER — Institutional Professional Consult (permissible substitution) (INDEPENDENT_AMBULATORY_CARE_PROVIDER_SITE_OTHER): Payer: Medicare (Managed Care)

## 2023-07-02 ENCOUNTER — Telehealth: Payer: Self-pay | Admitting: Family Medicine

## 2023-07-02 NOTE — Telephone Encounter (Signed)
Copied from CRM [id number]. Topic: Medicare AWV >> Jul 02, 2023  2:12 PM Payton Doughty wrote: Reason for CRM: Called LVM 07/02/2023 to schedule AWV. Please schedule Virtual or Telehealth visits ONLY  Verlee Rossetti; Care Guide Ambulatory Clinical Support [place] [facility] Direct Dial: [phone]

## 2023-07-13 ENCOUNTER — Other Ambulatory Visit: Payer: Self-pay | Admitting: Family Medicine

## 2023-07-17 DIAGNOSIS — M8008XD Age-related osteoporosis with current pathological fracture, vertebra(e), subsequent encounter for fracture with routine healing: Secondary | ICD-10-CM | POA: Diagnosis not present

## 2023-07-17 DIAGNOSIS — M8008XA Age-related osteoporosis with current pathological fracture, vertebra(e), initial encounter for fracture: Secondary | ICD-10-CM | POA: Diagnosis not present

## 2023-07-18 ENCOUNTER — Encounter (HOSPITAL_BASED_OUTPATIENT_CLINIC_OR_DEPARTMENT_OTHER): Payer: Self-pay | Admitting: Pulmonary Disease

## 2023-08-07 ENCOUNTER — Ambulatory Visit (INDEPENDENT_AMBULATORY_CARE_PROVIDER_SITE_OTHER): Payer: Medicare (Managed Care) | Admitting: Otolaryngology

## 2023-08-07 ENCOUNTER — Encounter (INDEPENDENT_AMBULATORY_CARE_PROVIDER_SITE_OTHER): Payer: Self-pay

## 2023-08-07 VITALS — BP 122/70 | HR 65 | Ht 63.0 in | Wt 185.0 lb

## 2023-08-07 DIAGNOSIS — R42 Dizziness and giddiness: Secondary | ICD-10-CM | POA: Diagnosis not present

## 2023-08-07 DIAGNOSIS — H6992 Unspecified Eustachian tube disorder, left ear: Secondary | ICD-10-CM

## 2023-08-07 MED ORDER — AZELASTINE HCL 0.1 % NA SOLN: 2.0000 | Freq: Two times a day (BID) | NASAL | 12 refills | Status: DC

## 2023-08-07 MED ORDER — FLUTICASONE PROPIONATE 50 MCG/ACT NA SUSP: 2.0000 | Freq: Two times a day (BID) | NASAL | 6 refills | Status: AC

## 2023-08-07 NOTE — Progress Notes (Signed)
 Dear Dr. Abner Greenspan, Here is my assessment for our mutual patient, Margaret [name]. Thank you for allowing me the opportunity to care for your patient. Please do not hesitate to contact me should you have any other questions. Sincerely, Dr. Jovita Kussmaul  Otolaryngology Clinic Note Referring provider: Dr. Abner Greenspan HPI:  Margaret [name] is a 73 y.o. adult kindly referred by Dr. Abner Greenspan for evaluation of imbalance and ETD.  Initial visit (07/2023): Patient reports: she reports that her current symptoms include intermittent, rare episodes of dizziness, when she lying down or turning over in bed. Worse with head movement. Lasts a few seconds, and then goes away. Nothing makes it come about otherwise or makes it go away. She does not report frank vertigo, but just feels off balance. She did have an episode in November when she did have a sudden onset of vertigo and lightheadedness after a URI; and went to the ED and prescribed meclizine, but never has happened since then. No autophony, no sound or pressure induced vertigo.  She has not taken meclizine for about 2-3 months Currently just has slight fullness and popping on left. "Feels like water is there" -- this has only been ongoing for about 1-2 weeks, did not happen prior to that. No recent URIs but is having allergy sx (itchy eyes, runny nose). Is taking zyrtec.  Patient denies: ear pain, drainage, tinnitus Patient additionally denies: deep pain in ear canal, sensitive to pressure changes Patient also denies barotrauma, vestibular suppressant use (not for past couple of months), ototoxic medication use Prior ear surgery: no No significant noise exposure.  She did have multiple rib fractures recently and thus is in a brace.   H&N Surgery: no Personal or FHx of bleeding dz or anesthesia difficulty: no  GLP-1: no AP/AC: ASA 81  Tobacco: no.  PMHx: HTN, Aortic Atherosclerosis, Stroke, GERD, OSA, HLD,   Independent Review of Additional Tests or  Records:  04/21/2023 ED visit Dr. Deretha Emory - dizziness with associated frontal headache, weakness, and chills; no prior strokes, no slurred speech; Dx: Vertigo, likely BPPV; Rx: Meclizine PRN Dr. Abner Greenspan (04/24/2023): dizziness - sudden sensation after bending over quickly, no preceding illness; felt "not quite right" in days leading up. Possible ETD; Dx: vertigo, ETD; Rx: Ref ENT; Flonase CT Head 04/21/2023: no mastoid effusion (cannot review images) or other intracranial abnormality [facility] 04/25/2021 independently reviewed and interpreted with respect to ears: no mastoid or ME effusion; no otic capsule or ossicular chain abnormalities; cuts thick so study suboptimal; paranasal sinuses clear without significant disease.  PMH/Meds/All/SocHx/FamHx/ROS:   Past Medical History:  Diagnosis Date   Allergic state 03/25/2012   Allergy    Aortic calcification (HCC) 08/27/2014   Atherosclerosis of aorta (HCC) 04/12/2014   Back pain    Constipation 01/23/2017   COVID-19 02/21/2021   GERD (gastroesophageal reflux disease)    Hiatal hernia with gastroesophageal reflux 04/27/2010   Qualifier: Diagnosis of  By: Terrilee Croak CMA, Darlene  Failed Omeprazole and Protonix    Hx of adenomatous polyp of colon 01/23/2017   Colonoscopy 01/16/2017 with 2 polyps one was adenomatous. recommeded follow up in 5 years.performed by Dr Lavon Paganini   Hyperlipidemia    Hypertension    Joint pain    Osteopenia 08/31/2012   Peripheral edema 12/13/2014   Sleep apnea    Unspecified sinusitis (chronic) 03/30/2013     Past Surgical History:  Procedure Laterality Date   24 HOUR PH STUDY N/A 12/15/2015   Procedure: 24 HOUR PH STUDY;  Surgeon: Philbert Riser  Beverely Risen, MD;  Location: [facility];  Service: Endoscopy;  Laterality: N/A;   DILATION AND CURETTAGE OF UTERUS     ESOPHAGEAL MANOMETRY N/A 12/15/2015   Procedure: ESOPHAGEAL MANOMETRY (EM);  Surgeon: Napoleon Form, MD;  Location: [facility];  Service: Endoscopy;  Laterality:  N/A;    Family History  Problem Relation Age of Onset   Stroke Mother    High blood pressure Mother    High Cholesterol Mother    Lung cancer Father    Stroke Sister        25   Thyroid disease Sister    Diabetes Sister    Hypertension Sister    Hyperlipidemia Sister    Hypertension Sister    Sleep apnea Son    Obesity Son    Colon cancer Neg Hx    Esophageal cancer Neg Hx    Stomach cancer Neg Hx      Social Connections: Socially Integrated (08/01/2022)   Social Connection and Isolation Panel [NHANES]    Frequency of Communication with Friends and Family: More than three times a week    Frequency of Social Gatherings with Friends and Family: Once a week    Attends Religious Services: More than 4 times per year    Active Member of [employer] or Organizations: Yes    Attends Engineer, structural: More than 4 times per year    Marital Status: Married      Current Outpatient Medications:    amLODipine (NORVASC) 5 MG tablet, TAKE 1 TABLET(5 MG) BY MOUTH DAILY, Disp: 90 tablet, Rfl: 1   aspirin 81 MG chewable tablet, Chew by mouth daily., Disp: , Rfl:    azelastine (ASTELIN) 0.1 % nasal spray, Place 2 sprays into both nostrils 2 (two) times daily. Use in each nostril as directed, Disp: 30 mL, Rfl: 12   calcitonin, salmon, (MIACALCIN/FORTICAL) 200 UNIT/ACT nasal spray, 200 sprays., Disp: , Rfl:    carvedilol (COREG) 6.25 MG tablet, TAKE 1 TABLET(6.25 MG) BY MOUTH TWICE DAILY WITH A MEAL, Disp: 180 tablet, Rfl: 1   fish oil-omega-3 fatty acids 1000 MG capsule, Take 2 g by mouth daily., Disp: , Rfl:    fluticasone (FLONASE) 50 MCG/ACT nasal spray, Place 2 sprays into both nostrils in the morning and at bedtime., Disp: 16 g, Rfl: 6   HYDROcodone-acetaminophen (NORCO/VICODIN) 5-325 MG tablet, Take 1 tablet by mouth daily as needed., Disp: , Rfl:    ibuprofen (ADVIL) 800 MG tablet, Take 800 mg by mouth every 8 (eight) hours as needed., Disp: , Rfl:    lidocaine (LIDODERM) 5 %,  Place 1 patch onto the skin every 12 (twelve) hours. Remove & Discard patch within 12 hours or as directed by MD, Disp: 180 each, Rfl: 2   losartan (COZAAR) 25 MG tablet, TAKE 1 TABLET(25 MG) BY MOUTH DAILY, Disp: 90 tablet, Rfl: 1   meclizine (ANTIVERT) 25 MG tablet, Take 1 tablet (25 mg total) by mouth 3 (three) times daily., Disp: 30 tablet, Rfl: 1   meloxicam (MOBIC) 7.5 MG tablet, Take 1-2 tablets (7.5-15 mg total) by mouth daily as needed for pain., Disp: 30 tablet, Rfl: 3   Multiple Vitamin (MULTIVITAMIN WITH MINERALS) TABS tablet, Take 1 tablet by mouth daily., Disp: , Rfl:    omeprazole (PRILOSEC) 40 MG capsule, Take 1 capsule (40 mg total) by mouth daily., Disp: 90 capsule, Rfl: 0   rosuvastatin (CRESTOR) 10 MG tablet, TAKE 1 TABLET(10 MG) BY MOUTH DAILY, Disp: 90 tablet, Rfl:  1   sucralfate (CARAFATE) 1 g tablet, TAKE 1 TABLET(1 GRAM) BY MOUTH FOUR TIMES DAILY AT BEDTIME WITH MEALS, Disp: 368 tablet, Rfl: 1   tiZANidine (ZANAFLEX) 2 MG tablet, Take 0.5-2 tablets (1-4 mg total) by mouth every 8 (eight) hours as needed for muscle spasms., Disp: 30 tablet, Rfl: 0   VITAMIN D PO, Take 2,500 Units by mouth daily., Disp: , Rfl:    Zinc 50 MG TABS, Take 1 tablet by mouth daily., Disp: , Rfl:    Physical Exam:   BP 122/70 (BP Location: Left Arm, Patient Position: Sitting, Cuff Size: Large)   Pulse 65   Ht 5\' 3"  (1.6 m)   Wt 185 lb (83.9 kg)   SpO2 95%   BMI 32.77 kg/m   Salient findings:  CN II-XII intact Given history and complaints, ear microscopy was indicated and performed for evaluation with findings as below in physical exam section and in procedures. Bilateral EAC clear and TM intact with well pneumatized middle ear spaces; slight global retraction left Weber 512: mid Rinne 512: AC > BC b/l  Rine 1024: AC > BC b/l  Anterior rhinoscopy: Septum relatively midline; bilateral inferior turbinates without significant hypertrophy No lesions of oral cavity/oropharynx No obviously  palpable neck masses/lymphadenopathy/thyromegaly No respiratory distress or stridor Head shake negative Dix Hallpike neg Gait normal  Seprately Identifiable Procedures:  Procedure: Bilateral ear microscopy using microscope (CPT [id number]) Pre-procedure diagnosis: Eustachian tube dysfunction, left ear fullness, vertigo Post-procedure diagnosis: same Indication: see above; given patient's otologic complaints and history, for improved and comprehensive examination of external ear and tympanic membrane, bilateral otologic examination using microscope was performed  Procedure: Patient was placed semi-recumbent. Both ear canals were examined using the microscope with findings above. Patient tolerated the procedure well.   Impression & Plans:  Margaret [name] is a 73 y.o. adult with:  1. Vertigo   2. ETD (Eustachian tube dysfunction), left    Intermittent, rare episodes of vertigo and lightheadedness especially when lying down or with turning in bed lasting few seconds. We discussed DDX which include BPPV most likely v/s other rare causes, but do not suspect meniere's or migraine or retrocochlear lesion based on history. She does have some ETD on left but this is more recent in onset associated with when her nasal symptoms started. As such, we discussed her options and given her rib fx currently, she opted for conservative course of mgmt  - autoinsufflate ears - start flonase BID - start astelin BID - will send her for vestibular rehab given persistence - will get audio - f/u 5-6 months (after she finishes rehab for ribs/current injuries)  See below regarding exact medications prescribed this encounter including dosages and route: Meds ordered this encounter  Medications   fluticasone (FLONASE) 50 MCG/ACT nasal spray    Sig: Place 2 sprays into both nostrils in the morning and at bedtime.    Dispense:  16 g    Refill:  6   azelastine (ASTELIN) 0.1 % nasal spray    Sig: Place 2 sprays  into both nostrils 2 (two) times daily. Use in each nostril as directed    Dispense:  30 mL    Refill:  12      Thank you for allowing me the opportunity to care for your patient. Please do not hesitate to contact me should you have any other questions.  Sincerely, Jovita Kussmaul, MD Otolaryngologist (ENT), [facility] Phone: [phone] Fax: [phone]  08/07/2023, 1:28 PM  MDM:  Level 4 - 99204 Complexity/Problems addressed: mod - multiple problems (one chronic, one acute) Data complexity: mod - independent review of notes, labs; independent interpretation of imaging - Morbidity: mod   - Prescription Drug prescribed or managed: yes

## 2023-08-07 NOTE — Patient Instructions (Addendum)
 [facility] number: [phone] For ear fullness, Use flonase two sprays each nostril twice daily; Use astelin two sprays each nostril twice daily -- can use right after flonase. Use for 6 weeks consecutively

## 2023-08-13 ENCOUNTER — Other Ambulatory Visit: Payer: Self-pay | Admitting: Family

## 2023-09-13 DIAGNOSIS — M8008XD Age-related osteoporosis with current pathological fracture, vertebra(e), subsequent encounter for fracture with routine healing: Secondary | ICD-10-CM | POA: Diagnosis not present

## 2023-09-13 DIAGNOSIS — M8008XA Age-related osteoporosis with current pathological fracture, vertebra(e), initial encounter for fracture: Secondary | ICD-10-CM | POA: Diagnosis not present

## 2023-09-19 ENCOUNTER — Other Ambulatory Visit: Payer: Self-pay | Admitting: Family Medicine

## 2023-09-19 DIAGNOSIS — I10 Essential (primary) hypertension: Secondary | ICD-10-CM

## 2023-09-26 ENCOUNTER — Telehealth: Payer: Self-pay | Admitting: Family Medicine

## 2023-09-26 NOTE — Telephone Encounter (Signed)
 Copied from CRM [id number]. Topic: Appointments - Scheduling Inquiry for Clinic >> Sep 26, 2023 11:37 AM Freya Jesus wrote: Reason for CRM: [employer] nurse called to advised patient is needing to schedule an appointment for bone density screening.

## 2023-09-27 NOTE — Telephone Encounter (Signed)
 Last bone density was on 10/26/20.

## 2023-09-28 LAB — HM DEXA SCAN

## 2023-10-09 DIAGNOSIS — G4733 Obstructive sleep apnea (adult) (pediatric): Secondary | ICD-10-CM | POA: Diagnosis not present

## 2023-10-10 ENCOUNTER — Telehealth: Payer: Self-pay | Admitting: Family Medicine

## 2023-10-10 NOTE — Telephone Encounter (Signed)
 Copied from CRM [id number]. Topic: Medicare AWV >> Oct 10, 2023 11:36 AM Juliana Ocean wrote: Reason for CRM: LVM 10/10/2023 to schedule AWV. Please schedule Virtual or Telehealth visits ONLY  Rosalee Collins; Care Guide Ambulatory Clinical Support [place] [facility] Direct Dial: [phone]

## 2023-10-11 ENCOUNTER — Ambulatory Visit: Payer: Medicare (Managed Care) | Admitting: Family Medicine

## 2023-10-11 ENCOUNTER — Encounter: Payer: Self-pay | Admitting: Family Medicine

## 2023-10-11 VITALS — BP 134/72 | HR 60 | Temp 98.1°F | Resp 16 | Ht 63.0 in | Wt 188.2 lb

## 2023-10-11 DIAGNOSIS — E559 Vitamin D deficiency, unspecified: Secondary | ICD-10-CM

## 2023-10-11 DIAGNOSIS — Z780 Asymptomatic menopausal state: Secondary | ICD-10-CM

## 2023-10-11 DIAGNOSIS — E782 Mixed hyperlipidemia: Secondary | ICD-10-CM | POA: Diagnosis not present

## 2023-10-11 DIAGNOSIS — E2839 Other primary ovarian failure: Secondary | ICD-10-CM

## 2023-10-11 DIAGNOSIS — R252 Cramp and spasm: Secondary | ICD-10-CM

## 2023-10-11 DIAGNOSIS — Z8781 Personal history of (healed) traumatic fracture: Secondary | ICD-10-CM

## 2023-10-11 DIAGNOSIS — M8580 Other specified disorders of bone density and structure, unspecified site: Secondary | ICD-10-CM

## 2023-10-11 DIAGNOSIS — M549 Dorsalgia, unspecified: Secondary | ICD-10-CM | POA: Diagnosis not present

## 2023-10-11 DIAGNOSIS — I10 Essential (primary) hypertension: Secondary | ICD-10-CM

## 2023-10-11 DIAGNOSIS — R739 Hyperglycemia, unspecified: Secondary | ICD-10-CM | POA: Diagnosis not present

## 2023-10-11 NOTE — Assessment & Plan Note (Signed)
 Last dexa 10/26/2020 will repeat Dexa due to recent fracture after a fall.

## 2023-10-11 NOTE — Assessment & Plan Note (Signed)
 Supplement and monitor

## 2023-10-11 NOTE — Patient Instructions (Addendum)
 Tetanus is due at pharmacy Prevnar 20 at pharmacy Covid booster   Lumbar Spine Fracture A lumbar spine fracture is a break in one of the bones of the lower back. Lumbar spine fractures can vary from mild to severe. The most severe types are those that: Cause the broken bones to move out of place (unstable). Injure or press on the spinal cord. Have multiple breaks in the bones and damage to nearby tissues (complex). During recovery, it is normal to have pain and stiffness in the lower back for several weeks. What are the causes? This condition may be caused by: A fall. A car accident. A gunshot wound. A hard, direct hit to the back. What increases the risk? You are more likely to develop this condition if: You are in a situation that could result in a fall or other violent injury. You have a condition that causes weakness in the bones (osteoporosis). What are the signs or symptoms? The main symptom of this condition is severe pain in the lower back. If a fracture is complex or severe, there may also be: An unusual shape or swollen area on the lower back. Limited ability to move an area of the lower back. Inability to empty the bladder (urinary retention). Inability to control when you urinate or have bowel movements (incontinence). Loss of strength or sensation in the legs, feet, and toes. Inability to move (paralysis). How is this diagnosed? This condition is diagnosed based on: A physical exam. Symptoms and what happened just before they developed. The results of imaging tests, such as an X-ray, CT scan, or MRI. If your nerves have been damaged, you may also have other tests to find out the extent of the damage. How is this treated? Treatment for this condition depends on how severe the injury is. Most fractures can be treated with: A back brace. Bed rest and limits on your activity. Pain medicine. Physical therapy. Fractures that are complex, involve multiple bones, or make  the spine unstable may require surgery. Surgery is done: To remove pressure from the nerves or spinal cord. To stabilize the broken pieces of bone. Follow these instructions at home: Medicines Take over-the-counter and prescription medicines only as told by your health care provider. Ask your health care provider if the medicine prescribed to you: Requires you to avoid driving or using machinery. Can cause constipation. You may need to take these actions to prevent or treat constipation: Drink enough fluid to keep your urine pale yellow. Take over-the-counter or prescription medicines. Eat foods that are high in fiber, such as beans, whole grains, and fresh fruits and vegetables. Limit foods that are high in fat and processed sugars, such as fried or sweet foods. If you have a back brace: Wear the back brace as told by your health care provider. Remove it only as told by your health care provider. Check the skin around the brace every day. Tell your health care provider about any concerns. Keep the brace clean. If the brace is not waterproof: Do not let it get wet. Cover it with a watertight covering when you take a bath or a shower. Ask your health care provider when it is safe to drive. Activity Rest as told by your health care provider. Do exercises as told by your health care provider. Return to your normal activities as told by your health care provider. Ask your health care provider what activities are safe for you. Managing pain, stiffness, and swelling If directed, put ice on  the injured area. To do this: If you have a removable brace, remove it only as told by your health care provider. Put ice in a plastic bag. Place a towel between your skin and the bag. Leave the ice on for 20 minutes, 2-3 times a day. If your skin turns bright red, remove the ice right away to prevent skin damage. The risk of skin damage is higher if you cannot feel pain, heat, or cold.  General  instructions Do not use any products that contain nicotine or tobacco. These products include cigarettes, chewing tobacco, and vaping devices, such e-cigarettes. These can delay bone healing. If you need help quitting, ask your health care provider. Do not drink alcohol. Keep all follow-up visits. Failing to follow up as recommended could result in permanent injury, disability, or long-lasting (chronic) pain. Where to find more information American Academy of Orth[employer]geons: orthoinfo.aaos.org Contact a health care provider if: You have a fever. Your pain medicine is not helping. Your pain does not get better over time. You cannot return to your normal activities as planned or expected. Get help right away if: You have difficulty breathing. Your pain is very bad and it suddenly gets worse. You have numbness, tingling, or weakness in any part of your body. You are unable to empty your bladder. You cannot control when you urinate or have bowel movements. You are unable to move any body part that is below the level of your injury. You have pain in your abdomen or uncontrolled vomiting. You have a warm, tender swelling in your leg. These symptoms may be an emergency. Get help right away. Call 911. Do not wait to see if the symptoms will go away. Do not drive yourself to the hospital. Summary A lumbar spine fracture is a break in one of the bones of the lower back. The main symptom of this condition is severe pain in the lower back. If a fracture is complex or severe, there may also be numbness, tingling, or paralysis in the legs. Most fractures can be treated with a back brace, pain medicine, bed rest and limits on activity, and physical therapy. Fractures that are complex, involve multiple bones, or make the spine unstable may require surgery. This information is not intended to replace advice given to you by your health care provider. Make sure you discuss any questions you have with  your health care provider. Document Revised: 09/09/2021 Document Reviewed: 09/09/2021 Elsevier Patient Education  2024 [employer].

## 2023-10-11 NOTE — Assessment & Plan Note (Signed)
 hgba1c acceptable, minimize simple carbs. Increase exercise as tolerated.

## 2023-10-11 NOTE — Assessment & Plan Note (Signed)
 Hydrate and monitor

## 2023-10-11 NOTE — Assessment & Plan Note (Signed)
 Well controlled, no changes to meds. Encouraged heart healthy diet such as the DASH diet and exercise as tolerated.

## 2023-10-11 NOTE — Assessment & Plan Note (Signed)
 Encourage heart healthy diet such as MIND or DASH diet, increase exercise, avoid trans fats, simple carbohydrates and processed foods, consider a krill or fish or flaxseed oil cap daily. Tolerating Rosuvastatin

## 2023-10-11 NOTE — Assessment & Plan Note (Signed)
 Margaret [name] in January 2025 and had a compression fracture at L1 and she has been treated by [facility]. She has rested and used pain meds which caused constipation.

## 2023-10-12 ENCOUNTER — Other Ambulatory Visit (INDEPENDENT_AMBULATORY_CARE_PROVIDER_SITE_OTHER): Payer: Medicare (Managed Care)

## 2023-10-12 DIAGNOSIS — R739 Hyperglycemia, unspecified: Secondary | ICD-10-CM

## 2023-10-12 DIAGNOSIS — E782 Mixed hyperlipidemia: Secondary | ICD-10-CM

## 2023-10-12 DIAGNOSIS — E559 Vitamin D deficiency, unspecified: Secondary | ICD-10-CM | POA: Diagnosis not present

## 2023-10-12 DIAGNOSIS — I10 Essential (primary) hypertension: Secondary | ICD-10-CM

## 2023-10-12 NOTE — Addendum Note (Signed)
 Addended by: Marigene Shoulder on: 10/12/2023 01:34 PM   Modules accepted: Orders

## 2023-10-14 NOTE — Progress Notes (Signed)
 Subjective:    Patient ID: Margaret [name], adult    DOB: 1951/03/04, 73 y.o.   MRN: [id number]  Chief Complaint  Patient presents with  . Medical Management of Chronic Issues    Patient presents today for a 6 month follow-up.  . Quality Metric Gaps    AWV, zoster, TDAP, pneumonia    HPI Discussed the use of AI scribe software for clinical note transcription with the patient, who gave verbal consent to proceed.  History of Present Illness Margaret [name] "Margaret [name]" is a 73 year old female who presents for follow-up after a fall resulting in compression fractures.  In January, she experienced a fall resulting in compression fractures at L1 and S1. She has been experiencing significant pain since the fall, for which she was prescribed narcotics, ibuprofen , and Miacalcin. She wore a brace for two and a half months. The pain was severe, and she described feeling like a 'zombie' due to the narcotics. She experienced constipation as a side effect of the pain medication, which improved after discontinuation of the narcotics. She continues to use prune juice to aid with bowel movements.  She has a history of osteopenia, confirmed by a bone density test. Her fractures were deemed non-surgical and expected to heal over time. She also has osteoarthritis, which was not treated concurrently with her fractures. She is scheduled to follow up with her spine doctor for further management of her osteoarthritis.  She reports occasional lightheadedness, which she attributes to dehydration and fluctuations in blood sugar. She is currently taking vitamin D  supplements, as her levels were slightly low during her last check. She also takes ibuprofen  for pain management, ensuring not to exceed the recommended dosage to avoid side effects.  She experiences bloating, particularly after consuming certain foods. She notes that her digestion may be slowing down with age, and her recent reduced mobility due to the fracture  may have contributed to this issue. She had a colonoscopy nearly four years ago, which showed benign results, and she plans to schedule another one soon.    Past Medical History:  Diagnosis Date  . Allergic state 03/25/2012  . Allergy   . Aortic calcification (HCC) 08/27/2014  . Atherosclerosis of aorta (HCC) 04/12/2014  . Back pain   . Constipation 01/23/2017  . COVID-19 02/21/2021  . GERD (gastroesophageal reflux disease)   . Hiatal hernia with gastroesophageal reflux 04/27/2010   Qualifier: Diagnosis of  By: Sanford Crumble CMA, Darlene  Failed Omeprazole  and Protonix    . Hx of adenomatous polyp of colon 01/23/2017   Colonoscopy 01/16/2017 with 2 polyps one was adenomatous. recommeded follow up in 5 years.performed by Dr Leonia Raman  . Hyperlipidemia   . Hypertension   . Joint pain   . Osteopenia 08/31/2012  . Peripheral edema 12/13/2014  . Sleep apnea   . Unspecified sinusitis (chronic) 03/30/2013    Past Surgical History:  Procedure Laterality Date  . 24 HOUR PH STUDY N/A 12/15/2015   Procedure: 24 HOUR PH STUDY;  Surgeon: Sergio Dandy, MD;  Location: [facility];  Service: Endoscopy;  Laterality: N/A;  . DILATION AND CURETTAGE OF UTERUS    . ESOPHAGEAL MANOMETRY N/A 12/15/2015   Procedure: ESOPHAGEAL MANOMETRY (EM);  Surgeon: Sergio Dandy, MD;  Location: [facility];  Service: Endoscopy;  Laterality: N/A;    Family History  Problem Relation Age of Onset  . Stroke Mother   . High blood pressure Mother   . High Cholesterol Mother   . Lung cancer Father   .  Stroke Sister        48  . Thyroid  disease Sister   . Diabetes Sister   . Hypertension Sister   . Hyperlipidemia Sister   . Hypertension Sister   . Sleep apnea Son   . Obesity Son   . Colon cancer Neg Hx   . Esophageal cancer Neg Hx   . Stomach cancer Neg Hx     Social History   Socioeconomic History  . Marital status: Married    Spouse name: Ace Abu  . Number of children: 2  . Years of education: 2   . Highest education level: Not on file  Occupational History  . Occupation: Airline pilot    Comment: retired  Tobacco Use  . Smoking status: Never  . Smokeless tobacco: Never  Vaping Use  . Vaping status: Never Used  Substance and Sexual Activity  . Alcohol use: Yes    Alcohol/week: 0.0 standard drinks of alcohol    Comment: Rarely  . Drug use: No  . Sexual activity: Not Currently    Comment: no dietary restrictions, lives with husand, works at [employer]  Other Topics Concern  . Not on file  Social History Narrative   Lives with husband   Caffeine- tea, 2 cups daily   Social Drivers of Health   Financial Resource Strain: Low Risk  (08/01/2022)   Overall Financial Resource Strain (CARDIA)   . Difficulty of Paying Living Expenses: Not hard at all  Food Insecurity: No Food Insecurity (08/01/2022)   Hunger Vital Sign   . Worried About Programme researcher, broadcasting/film/video in the Last Year: Never true   . Ran Out of Food in the Last Year: Never true  Transportation Needs: No Transportation Needs (08/01/2022)   PRAPARE - Transportation   . Lack of Transportation (Medical): No   . Lack of Transportation (Non-Medical): No  Physical Activity: Inactive (08/01/2022)   Exercise Vital Sign   . Days of Exercise per Week: 0 days   . Minutes of Exercise per Session: 0 min  Stress: No Stress Concern Present (08/01/2022)   [employer] of Occupational Health - Occupational Stress Questionnaire   . Feeling of Stress : Not at all  Social Connections: Socially Integrated (08/01/2022)   Social Connection and Isolation Panel [NHANES]   . Frequency of Communication with Friends and Family: More than three times a week   . Frequency of Social Gatherings with Friends and Family: Once a week   . Attends Religious Services: More than 4 times per year   . Active Member of Clubs or Organizations: Yes   . Attends Banker Meetings: More than 4 times per year   . Marital Status: Married  Catering manager Violence:  Not At Risk (08/01/2022)   Humiliation, Afraid, Rape, and Kick questionnaire   . Fear of Current or Ex-Partner: No   . Emotionally Abused: No   . Physically Abused: No   . Sexually Abused: No    Outpatient Medications Prior to Visit  Medication Sig Dispense Refill  . amLODipine  (NORVASC ) 5 MG tablet TAKE 1 TABLET(5 MG) BY MOUTH DAILY 90 tablet 1  . aspirin  81 MG chewable tablet Chew by mouth daily.    . azelastine  (ASTELIN ) 0.1 % nasal spray Place 2 sprays into both nostrils 2 (two) times daily. Use in each nostril as directed 30 mL 12  . calcitonin, salmon, (MIACALCIN/FORTICAL) 200 UNIT/ACT nasal spray 200 sprays.    . carvedilol  (COREG ) 6.25 MG tablet TAKE 1 TABLET(6.25 MG)  BY MOUTH TWICE DAILY WITH A MEAL 180 tablet 1  . fish oil-omega-3 fatty acids 1000 MG capsule Take 2 g by mouth daily.    . fluticasone  (FLONASE ) 50 MCG/ACT nasal spray Place 2 sprays into both nostrils in the morning and at bedtime. 16 g 6  . HYDROcodone-acetaminophen  (NORCO/VICODIN) 5-325 MG tablet Take 1 tablet by mouth daily as needed.    . ibuprofen  (ADVIL ) 800 MG tablet Take 800 mg by mouth every 8 (eight) hours as needed.    . lidocaine  (LIDODERM ) 5 % Place 1 patch onto the skin every 12 (twelve) hours. Remove & Discard patch within 12 hours or as directed by MD 180 each 2  . losartan  (COZAAR ) 25 MG tablet TAKE 1 TABLET(25 MG) BY MOUTH DAILY 90 tablet 1  . meclizine  (ANTIVERT ) 25 MG tablet Take 1 tablet (25 mg total) by mouth 3 (three) times daily. 30 tablet 1  . meloxicam  (MOBIC ) 7.5 MG tablet Take 1-2 tablets (7.5-15 mg total) by mouth daily as needed for pain. 30 tablet 3  . Multiple Vitamin (MULTIVITAMIN WITH MINERALS) TABS tablet Take 1 tablet by mouth daily.    . omeprazole  (PRILOSEC) 40 MG capsule Take 1 capsule (40 mg total) by mouth daily. 90 capsule 0  . rosuvastatin  (CRESTOR ) 10 MG tablet TAKE 1 TABLET(10 MG) BY MOUTH DAILY 90 tablet 1  . sucralfate  (CARAFATE ) 1 g tablet TAKE 1 TABLET(1 GRAM) BY MOUTH  FOUR TIMES DAILY AT BEDTIME WITH MEALS 368 tablet 1  . tiZANidine  (ZANAFLEX ) 2 MG tablet Take 0.5-2 tablets (1-4 mg total) by mouth every 8 (eight) hours as needed for muscle spasms. 30 tablet 0  . VITAMIN D  PO Take 2,500 Units by mouth daily.    . Zinc 50 MG TABS Take 1 tablet by mouth daily.     No facility-administered medications prior to visit.    Allergies  Allergen Reactions  . Lisinopril  Cough  . Potassium-Containing Compounds Rash    Review of Systems  Constitutional:  Positive for malaise/fatigue. Negative for fever.  HENT:  Negative for congestion.   Eyes:  Negative for blurred vision.  Respiratory:  Negative for shortness of breath.   Cardiovascular:  Negative for chest pain, palpitations and leg swelling.  Gastrointestinal:  Positive for constipation. Negative for abdominal pain, blood in stool and nausea.  Genitourinary:  Negative for dysuria and frequency.  Musculoskeletal:  Positive for back pain. Negative for falls.  Skin:  Negative for rash.  Neurological:  Positive for dizziness. Negative for loss of consciousness and headaches.  Endo/Heme/Allergies:  Negative for environmental allergies.  Psychiatric/Behavioral:  Negative for depression. The patient is not nervous/anxious.        Objective:     Physical Exam Constitutional:      General: She is not in acute distress.    Appearance: Normal appearance. She is well-developed. She is not toxic-appearing.  HENT:     Head: Normocephalic and atraumatic.     Right Ear: External ear normal.     Left Ear: External ear normal.     Nose: Nose normal.  Eyes:     General:        Right eye: No discharge.        Left eye: No discharge.     Conjunctiva/sclera: Conjunctivae normal.  Neck:     Thyroid : No thyromegaly.  Cardiovascular:     Rate and Rhythm: Normal rate and regular rhythm.     Heart sounds: Normal heart sounds. No murmur heard.  Pulmonary:     Effort: Pulmonary effort is normal. No respiratory  distress.     Breath sounds: Normal breath sounds.  Abdominal:     General: Bowel sounds are normal.     Palpations: Abdomen is soft.     Tenderness: There is no abdominal tenderness. There is no guarding.  Musculoskeletal:        General: Normal range of motion.     Cervical back: Neck supple.  Lymphadenopathy:     Cervical: No cervical adenopathy.  Skin:    General: Skin is warm and dry.  Neurological:     Mental Status: She is alert and oriented to person, place, and time.  Psychiatric:        Mood and Affect: Mood normal.        Behavior: Behavior normal.        Thought Content: Thought content normal.        Judgment: Judgment normal.    BP 134/72   Pulse 60   Temp 98.1 F (36.7 C)   Resp 16   Ht 5\' 3"  (1.6 m)   Wt 188 lb 3.2 oz (85.4 kg)   SpO2 96%   BMI 33.34 kg/m  Wt Readings from Last 3 Encounters:  10/11/23 188 lb 3.2 oz (85.4 kg)  08/07/23 185 lb (83.9 kg)  04/24/23 191 lb (86.6 kg)    Diabetic Foot Exam - Simple   No data filed    Lab Results  Component Value Date   WBC 5.0 04/12/2023   HGB 13.6 04/12/2023   HCT 41.8 04/12/2023   PLT 225.0 04/12/2023   GLUCOSE 72 04/12/2023   CHOL 183 04/12/2023   TRIG 128.0 04/12/2023   HDL 50.20 04/12/2023   LDLCALC 107 (H) 04/12/2023   ALT 37 (H) 04/12/2023   AST 34 04/12/2023   NA 142 04/12/2023   K 4.1 04/12/2023   CL 103 04/12/2023   CREATININE 0.66 04/12/2023   BUN 14 04/12/2023   CO2 31 04/12/2023   TSH 2.11 04/12/2023   HGBA1C 6.5 04/12/2023   MICROALBUR 3.06 (H) 05/16/2011    Lab Results  Component Value Date   TSH 2.11 04/12/2023   Lab Results  Component Value Date   WBC 5.0 04/12/2023   HGB 13.6 04/12/2023   HCT 41.8 04/12/2023   MCV 89.8 04/12/2023   PLT 225.0 04/12/2023   Lab Results  Component Value Date   NA 142 04/12/2023   K 4.1 04/12/2023   CO2 31 04/12/2023   GLUCOSE 72 04/12/2023   BUN 14 04/12/2023   CREATININE 0.66 04/12/2023   BILITOT 0.5 04/12/2023   ALKPHOS 84  04/12/2023   AST 34 04/12/2023   ALT 37 (H) 04/12/2023   PROT 7.5 04/12/2023   ALBUMIN 4.7 04/12/2023   CALCIUM  10.0 04/12/2023   ANIONGAP 8 04/25/2021   GFR 89.19 04/12/2023   Lab Results  Component Value Date   CHOL 183 04/12/2023   Lab Results  Component Value Date   HDL 50.20 04/12/2023   Lab Results  Component Value Date   LDLCALC 107 (H) 04/12/2023   Lab Results  Component Value Date   TRIG 128.0 04/12/2023   Lab Results  Component Value Date   CHOLHDL 4 04/12/2023   Lab Results  Component Value Date   HGBA1C 6.5 04/12/2023       Assessment & Plan:  Essential hypertension Assessment & Plan: Well controlled, no changes to meds. Encouraged heart healthy diet such as the DASH  diet and exercise as tolerated.   Orders: -     CBC with Differential/Platelet; Future -     Comprehensive metabolic panel with GFR; Future -     TSH; Future  Hyperglycemia Assessment & Plan: hgba1c acceptable, minimize simple carbs. Increase exercise as tolerated.   Orders: -     CBC with Differential/Platelet; Future -     Comprehensive metabolic panel with GFR; Future -     Hemoglobin A1c; Future  Hyperlipidemia, mixed Assessment & Plan: Encourage heart healthy diet such as MIND or DASH diet, increase exercise, avoid trans fats, simple carbohydrates and processed foods, consider a krill or fish or flaxseed oil cap daily. Tolerating Rosuvastatin   Orders: -     Lipid panel; Future  Vitamin D  deficiency Assessment & Plan: Supplement and monitor  Orders: -     VITAMIN D  25 Hydroxy (Vit-D Deficiency, Fractures); Future  Back pain, unspecified back location, unspecified back pain laterality, unspecified chronicity Assessment & Plan: Margaret [name] in January 2025 and had a compression fracture at L1 and she has been treated by [facility]. She has rested and used pain meds which caused constipation.   Muscle cramps Assessment & Plan: Hydrate and monitor     Post-menopausal  Estrogen deficiency  History of vertebral fracture  Osteopenia, unspecified location Assessment & Plan: Last dexa 10/26/2020 will repeat Dexa due to recent fracture after a fall.      Assessment and Plan Assessment & Plan Compression fracture of L1 and S1 Persistent pain and muscle spasms post-fall. Constipation improved after stopping narcotics. - Continue ibuprofen  400 mg every 4-6 hours as needed. - Alternate with acetaminophen  for pain. - Use lidocaine  patches for localized pain. - Encourage cushioning and support when sitting. - Avoid falls; use caution with step stools. - Continue prune juice for bowel regularity. - Consider milk of magnesia with prune juice if constipation recurs.  Osteoarthritis Treatment deferred until fracture recovery. Follow-up with spine specialist scheduled. - Schedule follow-up with spine specialist.  Osteopenia Confirmed by bone density test. No surgical intervention needed.  Vertigo Previous episode resolved. Lightheadedness likely due to dehydration and blood sugar fluctuations. - Encourage hydration with 10 ounces of water every 1-2 hours. - Encourage protein intake every 3-4 hours.  Vitamin D  deficiency Slight deficiency noted. Currently on supplementation. - Continue vitamin D  supplementation.      Randie Bustle, MD

## 2023-10-15 ENCOUNTER — Other Ambulatory Visit (INDEPENDENT_AMBULATORY_CARE_PROVIDER_SITE_OTHER): Payer: Medicare (Managed Care)

## 2023-10-15 DIAGNOSIS — R739 Hyperglycemia, unspecified: Secondary | ICD-10-CM | POA: Diagnosis not present

## 2023-10-15 DIAGNOSIS — E559 Vitamin D deficiency, unspecified: Secondary | ICD-10-CM | POA: Diagnosis not present

## 2023-10-15 DIAGNOSIS — E782 Mixed hyperlipidemia: Secondary | ICD-10-CM | POA: Diagnosis not present

## 2023-10-15 DIAGNOSIS — I10 Essential (primary) hypertension: Secondary | ICD-10-CM | POA: Diagnosis not present

## 2023-10-15 LAB — CBC WITH DIFFERENTIAL/PLATELET
Basophils Absolute: 0 10*3/uL (ref 0.0–0.1)
Basophils Relative: 0.9 % (ref 0.0–3.0)
Eosinophils Absolute: 0.3 10*3/uL (ref 0.0–0.7)
Eosinophils Relative: 5.3 % — ABNORMAL HIGH (ref 0.0–5.0)
HCT: 40.3 % (ref 36.0–46.0)
Hemoglobin: 13.1 g/dL (ref 12.0–15.0)
Lymphocytes Relative: 35.7 % (ref 12.0–46.0)
Lymphs Abs: 1.7 10*3/uL (ref 0.7–4.0)
MCHC: 32.6 g/dL (ref 30.0–36.0)
MCV: 89.3 fl (ref 78.0–100.0)
Monocytes Absolute: 0.5 10*3/uL (ref 0.1–1.0)
Monocytes Relative: 9.8 % (ref 3.0–12.0)
Neutro Abs: 2.3 10*3/uL (ref 1.4–7.7)
Neutrophils Relative %: 48.3 % (ref 43.0–77.0)
Platelets: 216 10*3/uL (ref 150.0–400.0)
RBC: 4.51 Mil/uL (ref 3.87–5.11)
RDW: 13.7 % (ref 11.5–15.5)
WBC: 4.9 10*3/uL (ref 4.0–10.5)

## 2023-10-15 LAB — HEMOGLOBIN A1C: Hgb A1c MFr Bld: 6.4 % (ref 4.6–6.5)

## 2023-10-15 LAB — LIPID PANEL
Cholesterol: 169 mg/dL (ref 0–200)
HDL: 51.2 mg/dL (ref >39.00)
LDL Cholesterol: 98 mg/dL (ref 0–99)
NonHDL: 118.2
Total CHOL/HDL Ratio: 3
Triglycerides: 102 mg/dL (ref 0.0–149.0)
VLDL: 20.4 mg/dL (ref 0.0–40.0)

## 2023-10-16 ENCOUNTER — Ambulatory Visit: Payer: Self-pay | Admitting: Family Medicine

## 2023-10-16 ENCOUNTER — Other Ambulatory Visit (HOSPITAL_BASED_OUTPATIENT_CLINIC_OR_DEPARTMENT_OTHER): Payer: Self-pay

## 2023-10-16 LAB — TSH: TSH: 1.67 u[IU]/mL (ref 0.35–5.50)

## 2023-10-16 LAB — VITAMIN D 25 HYDROXY (VIT D DEFICIENCY, FRACTURES): VITD: 41.7 ng/mL (ref 30.00–100.00)

## 2023-10-16 MED ORDER — PREVNAR 20 0.5 ML IM SUSY
0.5000 mL | PREFILLED_SYRINGE | Freq: Once | INTRAMUSCULAR | 0 refills | Status: AC
  Filled 2023-10-16: qty 0.5, 1d supply, fill #0

## 2023-10-29 ENCOUNTER — Other Ambulatory Visit (HOSPITAL_BASED_OUTPATIENT_CLINIC_OR_DEPARTMENT_OTHER): Payer: Self-pay

## 2023-11-08 DIAGNOSIS — M85852 Other specified disorders of bone density and structure, left thigh: Secondary | ICD-10-CM | POA: Diagnosis not present

## 2023-11-08 DIAGNOSIS — Z1331 Encounter for screening for depression: Secondary | ICD-10-CM | POA: Diagnosis not present

## 2023-11-08 DIAGNOSIS — S32010D Wedge compression fracture of first lumbar vertebra, subsequent encounter for fracture with routine healing: Secondary | ICD-10-CM | POA: Diagnosis not present

## 2023-11-12 ENCOUNTER — Encounter (HOSPITAL_BASED_OUTPATIENT_CLINIC_OR_DEPARTMENT_OTHER): Payer: Self-pay | Admitting: Pulmonary Disease

## 2023-11-12 ENCOUNTER — Ambulatory Visit (HOSPITAL_BASED_OUTPATIENT_CLINIC_OR_DEPARTMENT_OTHER): Payer: Medicare (Managed Care) | Admitting: Pulmonary Disease

## 2023-11-12 ENCOUNTER — Telehealth (HOSPITAL_BASED_OUTPATIENT_CLINIC_OR_DEPARTMENT_OTHER): Payer: Medicare (Managed Care) | Admitting: Pulmonary Disease

## 2023-11-12 VITALS — Ht 63.0 in

## 2023-11-12 DIAGNOSIS — G4733 Obstructive sleep apnea (adult) (pediatric): Secondary | ICD-10-CM | POA: Diagnosis not present

## 2023-11-12 NOTE — Addendum Note (Signed)
 Addended by: Kary Pages on: 11/12/2023 03:30 PM   Modules accepted: Orders

## 2023-11-12 NOTE — Progress Notes (Signed)
   Subjective:    Patient ID: Margaret [name], female    DOB: 17-Feb-1951, 73 y.o.   MRN: [id number]  HPI   I connected with  Margaret [name] on 11/12/23 by   video enabled telemedicine application and verified that I am speaking with the correct person using two identifiers.     Location: Patient: Home Provider: Office - [facility] - Surveyor, mining   I discussed the limitations of evaluation and management by telemedicine and the availability of in person appointments. The patient expressed understanding and agreed to proceed. I also discussed with the patient that there may be a patient responsible charge related to this service. The patient expressed understanding and agreed to proceed.   Patient consented to consult via video: Yes People present and their role in pt care: Pt  73 yo Filipino woman, never smoker, for FU of obstructive sleep apnea.  She moved from [place] In 2010   PMH - hypertension, CVA, coronary artery disease   3 year FU  Her obstructive sleep apnea is well-controlled with nightly CPAP use, achieving a good mask seal. The CPAP settings range from 5 to 15 cm H2O, with a maximum pressure of 13 cm H2O. She is considering a travel CPAP machine, which is generally less expensive than a home unit.  CPAP download: Auto machine between 5-15 cm H2O, needs 12 cm H2O, maximum 13 cm H2O, very low number of events, good mask seal (11/12/2023)   Significant tests/ events reviewed  PSG 05/2007 >> AHI 49/h, lowest desatn 71%, , corrected by CPAP 9 cm, placed on auto CPAP with nasal pillows   Review of Systems neg for any significant sore throat, dysphagia, itching, sneezing, nasal congestion or excess/ purulent secretions, fever, chills, sweats, unintended wt loss, pleuritic or exertional cp, hempoptysis, orthopnea pnd or change in chronic leg swelling. Also denies presyncope, palpitations, heartburn, abdominal pain, nausea, vomiting, diarrhea or change in bowel or  urinary habits, dysuria,hematuria, rash, arthralgias, visual complaints, headache, numbness weakness or ataxia.     Objective:   Physical Exam  No accessory muscle use       Assessment & Plan:   Obstructive Sleep Apnea (OSA) OSA is well-controlled with CPAP therapy. She is compliant with CPAP use, significantly improving daytime somnolence and fatigue. CPAP download shows optimal function with pressure settings between 5-15 cm H2O, requiring about 12 cm, with a maximum of 13 cm. Low apnea events indicate effective treatment. Discussed travel CPAP machine options, including insurance coverage or out-of-pocket purchase. Insurance covers a new machine every five years, using it for travel may delay home machine replacement. - Send prescription for a new travel CPAP machine with a pressure setting of 12 cm H2O to [facility]. - Advise her to contact [facility] by the end of the week to follow up on the travel CPAP machine.  Total encounter time x 31m

## 2023-11-12 NOTE — Patient Instructions (Signed)
 Rx for travel CPAP 12 cm through insurance to [facility]

## 2023-12-11 ENCOUNTER — Ambulatory Visit (INDEPENDENT_AMBULATORY_CARE_PROVIDER_SITE_OTHER): Payer: Medicare (Managed Care) | Admitting: Audiology

## 2023-12-11 ENCOUNTER — Other Ambulatory Visit (HOSPITAL_BASED_OUTPATIENT_CLINIC_OR_DEPARTMENT_OTHER): Payer: Self-pay

## 2023-12-11 ENCOUNTER — Ambulatory Visit (INDEPENDENT_AMBULATORY_CARE_PROVIDER_SITE_OTHER): Payer: Medicare (Managed Care) | Admitting: Otolaryngology

## 2023-12-11 MED ORDER — COVID-19 MRNA VAC-TRIS(PFIZER) 30 MCG/0.3ML IM SUSY
0.3000 mL | PREFILLED_SYRINGE | Freq: Once | INTRAMUSCULAR | 0 refills | Status: AC
  Filled 2023-12-11: qty 0.3, 1d supply, fill #0

## 2023-12-17 ENCOUNTER — Other Ambulatory Visit: Payer: Self-pay | Admitting: Family Medicine

## 2023-12-17 MED ORDER — OMEPRAZOLE 40 MG PO CPDR: 40.0000 mg | DELAYED_RELEASE_CAPSULE | Freq: Every day | ORAL | 1 refills | Status: AC

## 2023-12-17 NOTE — Telephone Encounter (Signed)
 Copied from CRM [id number]. Topic: Clinical - Medication Refill >> Dec 17, 2023  7:57 AM Robinson H wrote: Medication: omeprazole  (PRILOSEC) 40 MG capsule  Has the patient contacted their pharmacy? No, states she hasn't refilled in a while (Agent: If no, request that the patient contact the pharmacy for the refill. If patient does not wish to contact the pharmacy document the reason why and proceed with request.) (Agent: If yes, when and what did the pharmacy advise?)  This is the patient's preferred pharmacy:  [id number] - [place], [place] - [address] [place] [address] Phone: [phone] Fax: [phone]    Is this the correct pharmacy for this prescription? Yes If no, delete pharmacy and type the correct one.   Has the prescription been filled recently? No  Is the patient out of the medication? Yes  Has the patient been seen for an appointment in the last year OR does the patient have an upcoming appointment? Yes  Can we respond through MyChart? Yes  Agent: Please be advised that Rx refills may take up to 3 business days. We ask that you follow-up with your pharmacy.

## 2023-12-24 ENCOUNTER — Telehealth: Payer: Self-pay | Admitting: Family Medicine

## 2023-12-24 NOTE — Telephone Encounter (Signed)
 Copied from CRM [id number]. Topic: Medicare AWV >> Dec 24, 2023  9:31 AM Nathanel DEL wrote: Reason for CRM: Called LVM 12/24/2023 to schedule AWV. Please schedule Virtual or Telehealth visits ONLY.   Nathanel Paschal; Care Guide Ambulatory Clinical Support [facility] Direct Dial: [phone]

## 2024-01-07 ENCOUNTER — Other Ambulatory Visit: Payer: Self-pay | Admitting: Family Medicine

## 2024-01-09 DIAGNOSIS — G4733 Obstructive sleep apnea (adult) (pediatric): Secondary | ICD-10-CM | POA: Diagnosis not present

## 2024-02-07 ENCOUNTER — Ambulatory Visit (INDEPENDENT_AMBULATORY_CARE_PROVIDER_SITE_OTHER): Payer: Medicare (Managed Care) | Admitting: *Deleted

## 2024-02-07 ENCOUNTER — Telehealth: Payer: Self-pay | Admitting: *Deleted

## 2024-02-07 ENCOUNTER — Ambulatory Visit: Payer: Medicare (Managed Care) | Admitting: Family Medicine

## 2024-02-07 VITALS — Ht 62.0 in | Wt 183.0 lb

## 2024-02-07 DIAGNOSIS — Z0000 Encounter for general adult medical examination without abnormal findings: Secondary | ICD-10-CM | POA: Diagnosis not present

## 2024-02-07 DIAGNOSIS — Z1231 Encounter for screening mammogram for malignant neoplasm of breast: Secondary | ICD-10-CM

## 2024-02-07 NOTE — Patient Instructions (Addendum)
 [name] , Thank you for taking time out of your busy schedule to complete your Annual Wellness Visit with me. I enjoyed our conversation and look forward to speaking with you again next year. I, as well as your care team,  appreciate your ongoing commitment to your health goals. Please review the following plan we discussed and let me know if I can assist you in the future. Your Game plan/ To Do List    Referrals: If you haven't heard from the office you've been referred to, please reach out to them at the phone provided.   Eye Doctors:  See list at bottom of page  Mammogram [facility]):  [phone]  Follow up Visits: Next Medicare AWV with our clinical staff:  02/12/25 11am, telephone  Next Office Visit with your provider: 04/14/24 10am, Dr Margaret [name].  02/12/24  1:20pm Margaret [name]. Discuss osteoporosis, (can get flu vaccine at this visit)  Clinician Recommendations:  Aim for 30 minutes of exercise or brisk walking, 6-8 glasses of water, and 5 servings of fruits and vegetables each day.   You will need to get the following vaccines at your local pharmacy: tetanus, shingles      This is a list of the screening recommended for you and due dates:  Health Maintenance  Topic Date Due   Zoster (Shingles) Vaccine (1 of 2) Never done   DTaP/Tdap/Td vaccine (2 - Td or Tdap) 05/29/2022   Medicare Annual Wellness Visit  08/01/2023   Flu Shot  12/28/2023   Breast Cancer Screening  01/18/2024   COVID-19 Vaccine (6 - 2024-25 season) 06/12/2024   Colon Cancer Screening  03/25/2025   Pneumococcal Vaccine for age over 50  Completed   DEXA scan (bone density measurement)  Completed   Hepatitis C Screening  Completed   HPV Vaccine  Aged Out   Meningitis B Vaccine  Aged Out   Hepatitis B Vaccine  Discontinued    Advanced directives: (In Chart) A copy of your advanced directives are scanned into your chart should your provider ever need it. Advance Care Planning is important  because it:  [x]  Makes sure you receive the medical care that is consistent with your values, goals, and preferences  [x]  It provides guidance to your family and loved ones and reduces their decisional burden about whether or not they are making the right decisions based on your wishes.  Follow the link provided in your after visit summary or read over the paperwork we have mailed to you to help you started getting your Advance Directives in place. If you need assistance in completing these, please reach out to us  so that we can help you!  See attachments for Preventive Care and Fall Prevention Tips.   [facility]  [address]    [address]   [place], [place] [address]    [place], [place] [address] Phone: [phone]   Phone:  [phone] Fax: [phone]    Fax:  [phone]  [facility]    [facility] [address]   [address] [place], [place] [address]    [place], [place] [address] Phone: [phone]   Phone:  [phone] Fax: [phone]    Fax:  [phone]  [facility]    [facility] [address]    [address]  [place], [place] [address]   [place], [place] [address] Phone:  [phone]  Phone: [phone] Fax:  [phone]    Fax:  [phone]  [facility] [address] [place], [place] [address] [phone]  [facility] [address] [place], [place] [address] Phone: [phone] Fax: [phone]   [facility] [address]  [place], [place] [address] Phone:  [phone] Fax:  [phone]  [facility] [address],  [place], [place] [address] Phone: [phone] Fax:  [phone]

## 2024-02-07 NOTE — Telephone Encounter (Signed)
 Pt had AWV today:  Tells me that she no longer takes calcitonin and it is unclear why.  She tells me she finished it.  I am unsure if pt should still be taking, please advise?  Mentions back pain and fracture in lumbar spine found in January. Was referred to specialist by the ER. Pt states he referred her to someone else for her osteoporosis and pain but pt lost contact info and says no one ever called her from the initial referral.  Pt prefers to discuss plan / referral with PCP team.  Scheduled OV with Harlene to discuss on 02/12/24 1:20pm.  Pt will keep routine f/u with PCP in November.

## 2024-02-07 NOTE — Progress Notes (Signed)
 Subjective:   Margaret [name] is a 73 y.o. who presents for a Medicare Wellness preventive visit.  As a reminder, Annual Wellness Visits don't include a physical exam, and some assessments may be limited, especially if this visit is performed virtually. We may recommend an in-person follow-up visit with your provider if needed.  Visit Complete: Virtual I connected with  Viktorya Glendinning on 02/07/24 by a audio enabled telemedicine application and verified that I am speaking with the correct person using two identifiers.  Patient Location: Home  Provider Location: Office/Clinic  I discussed the limitations of evaluation and management by telemedicine. The patient expressed understanding and agreed to proceed.  Vital Signs: Because this visit was a virtual/telehealth visit, some criteria may be missing or patient reported. Any vitals not documented were not able to be obtained and vitals that have been documented are patient reported.  VideoDeclined- This patient declined Librarian, academic. Therefore the visit was completed with audio only.  Persons Participating in Visit: Patient.  AWV Questionnaire: No: Patient Medicare AWV questionnaire was not completed prior to this visit.  Cardiac Risk Factors include: advanced age (>78men, >92 women);hypertension;dyslipidemia;Other (see comment), Risk factor comments: OSA, hx of stroke, aortic atherosclerosis     Objective:    Today's Vitals   02/07/24 1028  Weight: 183 lb (83 kg)  Height: 5' 2 (1.575 m)   Body mass index is 33.47 kg/m.     02/07/2024   11:37 AM 08/01/2022    3:32 PM 05/19/2021    2:08 PM 04/25/2021    7:22 AM 11/27/2017    3:16 PM 01/16/2017   10:05 AM 01/16/2017   10:04 AM  Advanced Directives  Does Patient Have a Medical Advance Directive? Yes Yes Yes Yes No   Yes   Type of Estate agent of [employer] Power of [place];Living will Healthcare Power of  [place];Living will Healthcare Power of [place];Living will  Healthcare Power of [place];Living will   Does patient want to make changes to medical advance directive? No - Patient declined No - Patient declined No - Patient declined No - Patient declined     Copy of Healthcare Power of Attorney in Chart? Yes - validated most recent copy scanned in chart (See row information) No - copy requested No - copy requested No - copy requested     Would patient like information on creating a medical advance directive?    No - Patient declined        Data saved with a previous flowsheet row definition    Current Medications (verified) Outpatient Encounter Medications as of 02/07/2024  Medication Sig   amLODipine  (NORVASC ) 5 MG tablet TAKE 1 TABLET(5 MG) BY MOUTH DAILY   aspirin  81 MG chewable tablet Chew by mouth daily.   Calcium  Carbonate (CALCIUM  500 PO) Take 1 tablet by mouth daily.   carvedilol  (COREG ) 6.25 MG tablet TAKE 1 TABLET(6.25 MG) BY MOUTH TWICE DAILY WITH A MEAL   fish oil-omega-3 fatty acids 1000 MG capsule Take 2 g by mouth daily.   fluticasone  (FLONASE ) 50 MCG/ACT nasal spray Place 2 sprays into both nostrils in the morning and at bedtime.   HYDROcodone-acetaminophen  (NORCO/VICODIN) 5-325 MG tablet Take 1 tablet by mouth daily as needed.   ibuprofen  (ADVIL ) 800 MG tablet Take 800 mg by mouth every 8 (eight) hours as needed.   lidocaine  (LIDODERM ) 5 % Place 1 patch onto the skin every 12 (twelve) hours. Remove & Discard patch within 12 hours  or as directed by MD (Patient taking differently: Place 1 patch onto the skin every 12 (twelve) hours as needed. Remove & Discard patch within 12 hours or as directed by MD)   losartan  (COZAAR ) 25 MG tablet TAKE 1 TABLET(25 MG) BY MOUTH DAILY   meloxicam  (MOBIC ) 7.5 MG tablet Take 1-2 tablets (7.5-15 mg total) by mouth daily as needed for pain.   Multiple Vitamin (MULTIVITAMIN WITH MINERALS) TABS tablet Take 1 tablet by mouth daily.   omeprazole   (PRILOSEC) 40 MG capsule Take 1 capsule (40 mg total) by mouth daily. (Patient taking differently: Take 40 mg by mouth daily as needed.)   rosuvastatin  (CRESTOR ) 10 MG tablet TAKE 1 TABLET(10 MG) BY MOUTH DAILY   tiZANidine  (ZANAFLEX ) 2 MG tablet Take 0.5-2 tablets (1-4 mg total) by mouth every 8 (eight) hours as needed for muscle spasms.   VITAMIN D  PO Take 2,500 Units by mouth daily.   Zinc 50 MG TABS Take 1 tablet by mouth daily.   azelastine  (ASTELIN ) 0.1 % nasal spray Place 2 sprays into both nostrils 2 (two) times daily. Use in each nostril as directed (Patient not taking: Reported on 02/07/2024)   calcitonin, salmon, (MIACALCIN/FORTICAL) 200 UNIT/ACT nasal spray 200 sprays. (Patient not taking: Reported on 02/07/2024)   meclizine  (ANTIVERT ) 25 MG tablet Take 1 tablet (25 mg total) by mouth 3 (three) times daily. (Patient not taking: Reported on 02/07/2024)   [DISCONTINUED] sucralfate  (CARAFATE ) 1 g tablet TAKE 1 TABLET(1 GRAM) BY MOUTH FOUR TIMES DAILY AT BEDTIME WITH MEALS (Patient not taking: Reported on 02/07/2024)   No facility-administered encounter medications on file as of 02/07/2024.    Allergies (verified) Lisinopril  and Potassium-containing compounds   History: Past Medical History:  Diagnosis Date   Allergic state 03/25/2012   Allergy    Aortic calcification (HCC) 08/27/2014   Atherosclerosis of aorta (HCC) 04/12/2014   Back pain    Constipation 01/23/2017   COVID-19 02/21/2021   GERD (gastroesophageal reflux disease)    Hiatal hernia with gastroesophageal reflux 04/27/2010   Qualifier: Diagnosis of  By: Anice CMA, Darlene  Failed Omeprazole  and Protonix     Hx of adenomatous polyp of colon 01/23/2017   Colonoscopy 01/16/2017 with 2 polyps one was adenomatous. recommeded follow up in 5 years.performed by Dr Shila   Hyperlipidemia    Hypertension    Joint pain    Osteopenia 08/31/2012   Peripheral edema 12/13/2014   Sleep apnea    Unspecified sinusitis (chronic)  03/30/2013   Past Surgical History:  Procedure Laterality Date   24 HOUR PH STUDY N/A 12/15/2015   Procedure: 24 HOUR PH STUDY;  Surgeon: Gustav LULLA Shila, MD;  Location: [facility];  Service: Endoscopy;  Laterality: N/A;   DILATION AND CURETTAGE OF UTERUS     ESOPHAGEAL MANOMETRY N/A 12/15/2015   Procedure: ESOPHAGEAL MANOMETRY (EM);  Surgeon: Gustav LULLA Shila, MD;  Location: [facility];  Service: Endoscopy;  Laterality: N/A;   Family History  Problem Relation Age of Onset   Stroke Mother    High blood pressure Mother    High Cholesterol Mother    Lung cancer Father    Stroke Sister        6   Thyroid  disease Sister    Diabetes Sister    Hypertension Sister    Hyperlipidemia Sister    Hypertension Sister    Sleep apnea Son    Obesity Son    Colon cancer Neg Hx    Esophageal cancer Neg Hx  Stomach cancer Neg Hx    Social History   Socioeconomic History   Marital status: Married    Spouse name: Victory   Number of children: 2   Years of education: 16   Highest education level: Not on file  Occupational History   Occupation: Airline pilot    Comment: retired  Tobacco Use   Smoking status: Never   Smokeless tobacco: Never  Vaping Use   Vaping status: Never Used  Substance and Sexual Activity   Alcohol use: Yes    Alcohol/week: 0.0 standard drinks of alcohol    Comment: Rarely   Drug use: No   Sexual activity: Not Currently    Comment: no dietary restrictions, lives with husand, works at [employer]  Other Topics Concern   Not on file  Social History Narrative   Lives with husband   Caffeine- tea, 2 cups daily   Social Drivers of Health   Financial Resource Strain: Low Risk  (02/07/2024)   Overall Financial Resource Strain (CARDIA)    Difficulty of Paying Living Expenses: Not very hard  Food Insecurity: No Food Insecurity (02/07/2024)   Hunger Vital Sign    Worried About Running Out of Food in the Last Year: Never true    Ran Out of Food in the Last Year: Never  true  Transportation Needs: No Transportation Needs (02/07/2024)   PRAPARE - Administrator, Civil Service (Medical): No    Lack of Transportation (Non-Medical): No  Physical Activity: Insufficiently Active (02/07/2024)   Exercise Vital Sign    Days of Exercise per Week: 3 days    Minutes of Exercise per Session: 10 min  Stress: No Stress Concern Present (02/07/2024)   [employer] of Occupational Health - Occupational Stress Questionnaire    Feeling of Stress: Not at all  Social Connections: Socially Integrated (02/07/2024)   Social Connection and Isolation Panel    Frequency of Communication with Friends and Family: More than three times a week    Frequency of Social Gatherings with Friends and Family: Once a week    Attends Religious Services: More than 4 times per year    Active Member of [employer] or Organizations: Yes    Attends Engineer, structural: More than 4 times per year    Marital Status: Married    Tobacco Counseling Counseling given: Not Answered    Clinical Intake:  Pre-visit preparation completed: Yes      BMI - recorded: 33.47 Nutritional Risks: None Diabetes: No  Lab Results  Component Value Date   HGBA1C 6.4 10/15/2023   HGBA1C 6.5 04/12/2023   HGBA1C 6.5 12/14/2022     How often do you need to have someone help you when you read instructions, pamphlets, or other written materials from your doctor or pharmacy?: 1 - Never What is the last grade level you completed in school?: bachelor's degree  Interpreter Needed?: No  Information entered by :: Lolita Libra, CMA(AAMA)   Activities of Daily Living     02/07/2024   10:37 AM  In your present state of health, do you have any difficulty performing the following activities:  Hearing? 0  Vision? 0  Difficulty concentrating or making decisions? 0  Walking or climbing stairs? 0  Dressing or bathing? 0  Doing errands, shopping? 0  Preparing Food and eating ? N  Using the  Toilet? N  In the past six months, have you accidently leaked urine? N  Do you have problems with loss of bowel  control? N  Managing your Medications? N  Managing your Finances? N  Housekeeping or managing your Housekeeping? N    Patient Care Team: Domenica Harlene LABOR, MD as PCP - General (Family Medicine) Jude Harden GAILS, MD as Consulting Physician (Pulmonary Disease) Pietro Redell RAMAN, MD as Consulting Physician (Cardiology) Teressa Toribio LABOR Jann (Inactive) as Consulting Physician (Gastroenterology)  I have updated your Care Teams any recent Medical Services you may have received from other providers in the past year.     Assessment:   This is a routine wellness examination for [place].  Hearing/Vision screen Hearing Screening - Comments:: Denies hearing difficulties.  Vision Screening - Comments:: Up to date with routine eye exams with [facility]. Looking for new doctor, will send list of options to patient    Goals Addressed   None    Depression Screen     02/07/2024   10:40 AM 12/14/2022   11:30 AM 09/14/2022   11:34 AM 09/14/2022   11:33 AM 08/01/2022    3:35 PM 04/25/2022    1:33 PM 01/12/2022    2:52 PM  PHQ 2/9 Scores  PHQ - 2 Score 0 0 0 0 0 0 0  PHQ- 9 Score 1 0 0 0   0    Fall Risk     02/07/2024   10:45 AM 10/11/2023    1:59 PM 12/14/2022   11:30 AM 09/14/2022   11:34 AM 09/14/2022   11:33 AM  Fall Risk   Falls in the past year? 1 1 0 0 0  Number falls in past yr: 0 0 0 0 0  Injury with Fall? 1 0 0 0 0  Risk for fall due to : Orthopedic patient History of fall(s)     Follow up Education provided Falls evaluation completed Falls evaluation completed Falls evaluation completed Falls evaluation completed    MEDICARE RISK AT HOME:  Medicare Risk at Home Any stairs in or around the home?: Yes If so, are there any without handrails?: No Home free of loose throw rugs in walkways, pet beds, electrical cords, etc?: Yes Adequate lighting in your home to  reduce risk of falls?: Yes Life alert?: No Use of a cane, walker or w/c?: No Grab bars in the bathroom?: Yes Shower chair or bench in shower?: No Elevated toilet seat or a handicapped toilet?: No  TIMED UP AND GO:  Was the test performed?  No,audio  Cognitive Function: 6CIT completed        02/07/2024   10:45 AM 08/01/2022    3:43 PM  6CIT Screen  What Year? 0 points 0 points  What month? 0 points 0 points  What time? 0 points 0 points  Count back from 20 0 points 0 points  Months in reverse 0 points 0 points  Repeat phrase 6 points 0 points  Total Score 6 points 0 points    Immunizations Immunization History  Administered Date(s) Administered   Fluad  Quad(high Dose 65+) 02/11/2019, 03/15/2020, 03/22/2021, 03/07/2022   Fluad  Trivalent(High Dose 65+) 04/12/2023   Hep A / Hep B 04/14/2013   Influenza,inj,Quad PF,6+ Mos 03/24/2013, 04/07/2014, 02/11/2015, 06/01/2016, 03/29/2017, 04/30/2018   PFIZER(Purple Top)SARS-COV-2 Vaccination 07/19/2019, 08/12/2019, 04/09/2020   PNEUMOCOCCAL CONJUGATE-20 10/16/2023   Pfizer(Comirnaty )Fall Seasonal Vaccine 12 years and older 03/27/2022, 12/11/2023   Pneumococcal Conjugate-13 04/10/2013, 01/11/2021   Respiratory Syncytial Virus Vaccine ,Recomb Aduvanted(Arexvy ) 04/13/2022   Tdap 05/29/2012   Typhoid Inactivated 04/14/2013    Screening Tests Health Maintenance  Topic Date Due  Zoster Vaccines- Shingrix (1 of 2) Never done   DTaP/Tdap/Td (2 - Td or Tdap) 05/29/2022   Medicare Annual Wellness (AWV)  08/01/2023   Influenza Vaccine  12/28/2023   Mammogram  01/18/2024   COVID-19 Vaccine (6 - 2024-25 season) 06/12/2024   Colonoscopy  03/25/2025   Pneumococcal Vaccine: 50+ Years  Completed   DEXA SCAN  Completed   Hepatitis C Screening  Completed   HPV VACCINES  Aged Out   Meningococcal B Vaccine  Aged Out   Hepatitis B Vaccines 19-59 Average Risk  Discontinued    Health Maintenance Items Addressed: Will get tetanus at pharmacy,  Mammogram ordered.  Additional Screening:  Vision Screening: Recommended annual ophthalmology exams for early detection of glaucoma and other disorders of the eye. Is the patient up to date with their annual eye exam?  No  Who is the provider or what is the name of the office in which the patient attends annual eye exams? [facility], looking for new doctor  Dental Screening: Recommended annual dental exams for proper oral hygiene  Community Resource Referral / Chronic Care Management: CRR required this visit?  No   CCM required this visit?  No   Plan:    I have personally reviewed and noted the following in the patient's chart:   Medical and social history Use of alcohol, tobacco or illicit drugs  Current medications and supplements including opioid prescriptions. Patient is currently taking opioid prescriptions. Information provided to patient regarding non-opioid alternatives. Patient advised to discuss non-opioid treatment plan with their provider. Functional ability and status Nutritional status Physical activity Advanced directives List of other physicians Hospitalizations, surgeries, and ER visits in previous 12 months Vitals Screenings to include cognitive, depression, and falls Referrals and appointments  In addition, I have reviewed and discussed with patient certain preventive protocols, quality metrics, and best practice recommendations. A written personalized care plan for preventive services as well as general preventive health recommendations were provided to patient.   Lolita Libra, CMA   02/07/2024   After Visit Summary: (MyChart) Due to this being a telephonic visit, the after visit summary with patients personalized plan was offered to patient via MyChart   Notes: see phone note

## 2024-02-08 NOTE — Telephone Encounter (Signed)
 Spoke with pt. She states she was not happy with [facility] specialists and treatment she received. She does not know who they referred her to as no one ever called her from that referral.  She does not have confidence in the care she received previously and wishes to discuss this with Provider and get further recommendations from PCP office. Will keep OV on 9/16 as scheduled.

## 2024-02-08 NOTE — Progress Notes (Signed)
 Subjective:     Patient ID: Margaret [name], female    DOB: 1950-08-01, 73 y.o.   MRN: [id number]  Chief Complaint  Patient presents with   Osteoporosis   Back Pain    HPI  Margaret [name] is a 73 year old female presents for follow-up  History of Present Illness Margaret [name] is a 73 year old female with a history of vertebral fracture and osteopenia who presents for follow-up regarding back pain and osteoporosis management. In early January, [name] experienced a fall resulting in a vertebral fracture. Despite healing, she continues to have significant lower back pain. bone density test shows osteopenia.  Current pain management includes intermittent ibuprofen  and Tylenol , which cause stomach discomfort. She uses over-the-counter lidocaine  patches for temporary relief. Pain occurs daily, reaching 7/10  especially after prolonged sitting. She manages pain by resting and using a rolled towel for support while sleeping. She traveled to [place] in August without taking pain medication. Pain is worse in the morning and after sitting for extended periods. She has not engaged in physical therapy since the fall, although she has a history of participating in PT for other issues.  Current medications include Crestor , fish oil, omeprazole , vitamin D , amlodipine , losartan , and carvedilol . She takes omeprazole  daily, especially when traveling, due to stomach issues, and vitamin D  2500 IU daily.    HTN-amlodipine  5 mg daily, carvedilol  6.25 mg, losartan  25 mg daily  HLD-rosuvastatin  10 mg daily, omega-3 fish oil 2 g daily  GERD-omeprazole  40 mg as needed  Allergies-Flonase   Vitamin D  deficiency-2500 units vitamin D  daily  Chronic back pain-denies radiculopathy, denies bowel or bladder irregularities, denies numbness and tingling   Reports taking medications as prescribed  Patient denies fever, chills, SOB, CP, palpitations, dyspnea, edema, HA, vision changes, N/V/D, abdominal  pain, urinary symptoms, rash, weight changes, and recent illness or hospitalizations.    History of Present Illness          Health Maintenance Due  Topic Date Due   Mammogram  01/18/2023   Influenza Vaccine  12/28/2023    Past Medical History:  Diagnosis Date   Allergic state 03/25/2012   Allergy    Aortic calcification (HCC) 08/27/2014   Atherosclerosis of aorta (HCC) 04/12/2014   Back pain    Constipation 01/23/2017   COVID-19 02/21/2021   GERD (gastroesophageal reflux disease)    Hiatal hernia with gastroesophageal reflux 04/27/2010   Qualifier: Diagnosis of  By: Anice CMA, Darlene  Failed Omeprazole  and Protonix     Hx of adenomatous polyp of colon 01/23/2017   Colonoscopy 01/16/2017 with 2 polyps one was adenomatous. recommeded follow up in 5 years.performed by Dr Shila   Hyperlipidemia    Hypertension    Joint pain    Osteopenia 08/31/2012   Peripheral edema 12/13/2014   Sleep apnea    Unspecified sinusitis (chronic) 03/30/2013    Past Surgical History:  Procedure Laterality Date   24 HOUR PH STUDY N/A 12/15/2015   Procedure: 24 HOUR PH STUDY;  Surgeon: Gustav LULLA Shila, MD;  Location: [facility];  Service: Endoscopy;  Laterality: N/A;   DILATION AND CURETTAGE OF UTERUS     ESOPHAGEAL MANOMETRY N/A 12/15/2015   Procedure: ESOPHAGEAL MANOMETRY (EM);  Surgeon: Gustav LULLA Shila, MD;  Location: [facility];  Service: Endoscopy;  Laterality: N/A;    Family History  Problem Relation Age of Onset   Stroke Mother    High blood pressure Mother    High Cholesterol Mother    Lung cancer Father  Stroke Sister        20   Thyroid  disease Sister    Diabetes Sister    Hypertension Sister    Hyperlipidemia Sister    Hypertension Sister    Sleep apnea Son    Obesity Son    Colon cancer Neg Hx    Esophageal cancer Neg Hx    Stomach cancer Neg Hx     Social History   Socioeconomic History   Marital status: Married    Spouse name: Victory   Number of  children: 2   Years of education: 16   Highest education level: Not on file  Occupational History   Occupation: Airline pilot    Comment: retired  Tobacco Use   Smoking status: Never   Smokeless tobacco: Never  Vaping Use   Vaping status: Never Used  Substance and Sexual Activity   Alcohol use: Yes    Alcohol/week: 0.0 standard drinks of alcohol    Comment: Rarely   Drug use: No   Sexual activity: Not Currently    Comment: no dietary restrictions, lives with husand, works at [employer]  Other Topics Concern   Not on file  Social History Narrative   Lives with husband   Caffeine- tea, 2 cups daily   Social Drivers of Health   Financial Resource Strain: Low Risk  (02/07/2024)   Overall Financial Resource Strain (CARDIA)    Difficulty of Paying Living Expenses: Not very hard  Food Insecurity: No Food Insecurity (02/07/2024)   Hunger Vital Sign    Worried About Running Out of Food in the Last Year: Never true    Ran Out of Food in the Last Year: Never true  Transportation Needs: No Transportation Needs (02/07/2024)   PRAPARE - Administrator, Civil Service (Medical): No    Lack of Transportation (Non-Medical): No  Physical Activity: Insufficiently Active (02/07/2024)   Exercise Vital Sign    Days of Exercise per Week: 3 days    Minutes of Exercise per Session: 10 min  Stress: No Stress Concern Present (02/07/2024)   [employer] of Occupational Health - Occupational Stress Questionnaire    Feeling of Stress: Not at all  Social Connections: Socially Integrated (02/07/2024)   Social Connection and Isolation Panel    Frequency of Communication with Friends and Family: More than three times a week    Frequency of Social Gatherings with Friends and Family: Once a week    Attends Religious Services: More than 4 times per year    Active Member of [employer] or Organizations: Yes    Attends Engineer, structural: More than 4 times per year    Marital Status: Married   Catering manager Violence: Not At Risk (02/07/2024)   Humiliation, Afraid, Rape, and Kick questionnaire    Fear of Current or Ex-Partner: No    Emotionally Abused: No    Physically Abused: No    Sexually Abused: No    Outpatient Medications Prior to Visit  Medication Sig Dispense Refill   aspirin  81 MG chewable tablet Chew by mouth daily.     Calcium  Carbonate (CALCIUM  500 PO) Take 1 tablet by mouth daily.     fish oil-omega-3 fatty acids 1000 MG capsule Take 2 g by mouth daily.     fluticasone  (FLONASE ) 50 MCG/ACT nasal spray Place 2 sprays into both nostrils in the morning and at bedtime. 16 g 6   ibuprofen  (ADVIL ) 800 MG tablet Take 800 mg by mouth every 8 (eight)  hours as needed.     meclizine  (ANTIVERT ) 25 MG tablet Take 1 tablet (25 mg total) by mouth 3 (three) times daily. 30 tablet 1   meloxicam  (MOBIC ) 7.5 MG tablet Take 1-2 tablets (7.5-15 mg total) by mouth daily as needed for pain. 30 tablet 3   Multiple Vitamin (MULTIVITAMIN WITH MINERALS) TABS tablet Take 1 tablet by mouth daily.     omeprazole  (PRILOSEC) 40 MG capsule Take 1 capsule (40 mg total) by mouth daily. (Patient taking differently: Take 40 mg by mouth daily as needed.) 90 capsule 1   tiZANidine  (ZANAFLEX ) 2 MG tablet Take 0.5-2 tablets (1-4 mg total) by mouth every 8 (eight) hours as needed for muscle spasms. 30 tablet 0   VITAMIN D  PO Take 2,500 Units by mouth daily.     Zinc 50 MG TABS Take 1 tablet by mouth daily.     amLODipine  (NORVASC ) 5 MG tablet TAKE 1 TABLET(5 MG) BY MOUTH DAILY 90 tablet 1   carvedilol  (COREG ) 6.25 MG tablet TAKE 1 TABLET(6.25 MG) BY MOUTH TWICE DAILY WITH A MEAL 180 tablet 1   lidocaine  (LIDODERM ) 5 % Place 1 patch onto the skin every 12 (twelve) hours. Remove & Discard patch within 12 hours or as directed by MD (Patient taking differently: Place 1 patch onto the skin every 12 (twelve) hours as needed. Remove & Discard patch within 12 hours or as directed by MD) 180 each 2   losartan   (COZAAR ) 25 MG tablet TAKE 1 TABLET(25 MG) BY MOUTH DAILY 90 tablet 1   rosuvastatin  (CRESTOR ) 10 MG tablet TAKE 1 TABLET(10 MG) BY MOUTH DAILY 90 tablet 2   azelastine  (ASTELIN ) 0.1 % nasal spray Place 2 sprays into both nostrils 2 (two) times daily. Use in each nostril as directed (Patient not taking: Reported on 02/07/2024) 30 mL 12   calcitonin, salmon, (MIACALCIN/FORTICAL) 200 UNIT/ACT nasal spray 200 sprays. (Patient not taking: Reported on 02/07/2024)     HYDROcodone-acetaminophen  (NORCO/VICODIN) 5-325 MG tablet Take 1 tablet by mouth daily as needed.     No facility-administered medications prior to visit.    Allergies  Allergen Reactions   Lisinopril  Cough   Potassium-Containing Compounds Rash    ROS See HPI    Objective:    Physical Exam General: No acute distress. Awake and conversant. +obese Eyes: Normal conjunctiva, anicteric. Round symmetric pupils.   Respiratory: CTAB. Respirations are non-labored. No wheezing.  Skin: Warm. No rashes or ulcers.  Psych: Alert and oriented. Cooperative, Appropriate mood and affect, Normal judgment.  CV: RRR. No murmur. No lower extremity edema.  MSK: Normal ambulation. No clubbing or cyanosis.  Neuro:  CN II-XII grossly normal.    BP 110/62 (BP Location: Left Arm, Patient Position: Sitting, Cuff Size: Large)   Pulse (!) 53   Temp 98.3 F (36.8 C) (Oral)   Resp 12   Ht 5' 2 (1.575 m)   Wt 189 lb 12.8 oz (86.1 kg)   SpO2 97%   BMI 34.71 kg/m  Wt Readings from Last 3 Encounters:  02/12/24 189 lb 12.8 oz (86.1 kg)  02/07/24 183 lb (83 kg)  10/11/23 188 lb 3.2 oz (85.4 kg)       Assessment & Plan:   Problem List Items Addressed This Visit     Back pain   Chronic.  January 2025 Pt had fall resulting in compression fracture at L1.   Pain exacerbated by prolonged sitting and in the morning. Previous vertebral fracture healed. Ibuprofen  and Tylenol  discontinued  due to gastrointestinal discomfort.  - Initiate physical therapy  for lower back strengthening and flexibility. - Prescribe Tylenol  500 mg twice daily, with option to increase to 1000 mg twice daily if needed. - Prescribe lidocaine  5% patches for use every 12 hours as needed. - Encourage daily light stretching and posture improvement. - Consider Sports Med referral back if pain persists after conservative management. - Advise on weight loss and strength training to reduce joint stress. - Ensure adequate calcium  and vitamin D  intake.        Relevant Medications   acetaminophen  (TYLENOL ) 500 MG tablet   Other Relevant Orders   Ambulatory referral to Physical Therapy   Essential hypertension - Primary   Well controlled, no changes to meds. Encouraged heart healthy diet such as the DASH diet and exercise as tolerated.        Relevant Medications   amLODipine  (NORVASC ) 5 MG tablet   carvedilol  (COREG ) 6.25 MG tablet   losartan  (COZAAR ) 25 MG tablet   rosuvastatin  (CRESTOR ) 10 MG tablet   Other Relevant Orders   TSH   Facet arthritis of lumbar region   Relevant Medications   acetaminophen  (TYLENOL ) 500 MG tablet   lidocaine  (LIDODERM ) 5 %   Hyperglycemia   hgba1c acceptable, minimize simple carbs. Increase exercise as tolerated.         Relevant Orders   Comprehensive metabolic panel with GFR   HgB J8r   Hyperlipidemia, mixed   Encourage heart healthy diet such as MIND or DASH diet, increase exercise, avoid trans fats, simple carbohydrates and processed foods, consider a krill or fish or flaxseed oil cap daily. Tolerating Rosuvastatin         Relevant Medications   amLODipine  (NORVASC ) 5 MG tablet   carvedilol  (COREG ) 6.25 MG tablet   losartan  (COZAAR ) 25 MG tablet   rosuvastatin  (CRESTOR ) 10 MG tablet   Other Relevant Orders   Lipid panel   Obesity (BMI 30-39.9)   Encouraged DASH or MIND diet, decrease po intake and increase exercise as tolerated. Needs 7-8 hours of sleep nightly. Avoid trans fats, eat small, frequent meals every 4-5  hours with lean proteins, complex carbs and healthy fats. Minimize simple carbs, high fat foods and processed foods        Obstructive sleep apnea syndrome   Wears CPAP.  Follows with pulmonology.      Other Visit Diagnoses       History of vertebral fracture           OSA & Obesity Consider GLP-1 medication to aid in weight loss efforts   I have discontinued Jeroline Smolen [name]'s HYDROcodone-acetaminophen , calcitonin (salmon), and azelastine . I have also changed her lidocaine , carvedilol , losartan , and rosuvastatin . Additionally, I am having her start on acetaminophen . Lastly, I am having her maintain her fish oil-omega-3 fatty acids, multivitamin with minerals, aspirin , VITAMIN D  PO, Zinc, meloxicam , tiZANidine , meclizine , ibuprofen , fluticasone , omeprazole , Calcium  Carbonate (CALCIUM  500 PO), and amLODipine .  Meds ordered this encounter  Medications   acetaminophen  (TYLENOL ) 500 MG tablet    Sig: Take 1 tablet (500 mg total) by mouth in the morning and at bedtime.    Dispense:  30 tablet    Refill:  0    Supervising Provider:   DOMENICA BLACKBIRD A [[id number]]   lidocaine  (LIDODERM ) 5 %    Sig: Place 1 patch onto the skin every 12 (twelve) hours as needed. Remove & Discard patch within 12 hours or as directed by MD    Dispense:  30 patch    Refill:  1    Supervising Provider:   DOMENICA BLACKBIRD A [[id number]]   amLODipine  (NORVASC ) 5 MG tablet    Sig: TAKE 1 TABLET(5 MG) BY MOUTH DAILY    Dispense:  90 tablet    Refill:  1    Supervising Provider:   DOMENICA BLACKBIRD A [[id number]]   carvedilol  (COREG ) 6.25 MG tablet    Sig: Take 1 tablet (6.25 mg total) by mouth in the morning and at bedtime.    Dispense:  180 tablet    Refill:  1    Supervising Provider:   DOMENICA BLACKBIRD A [[id number]]   losartan  (COZAAR ) 25 MG tablet    Sig: Take 1 tablet (25 mg total) by mouth daily.    Dispense:  90 tablet    Refill:  1    Supervising Provider:   DOMENICA BLACKBIRD A [[id number]]   rosuvastatin  (CRESTOR ) 10 MG tablet     Sig: Take 1 tablet (10 mg total) by mouth daily.    Dispense:  90 tablet    Refill:  2    Supervising Provider:   DOMENICA BLACKBIRD A [[id number]]

## 2024-02-12 ENCOUNTER — Ambulatory Visit (INDEPENDENT_AMBULATORY_CARE_PROVIDER_SITE_OTHER): Payer: Medicare (Managed Care) | Admitting: Student

## 2024-02-12 ENCOUNTER — Encounter: Payer: Self-pay | Admitting: Student

## 2024-02-12 VITALS — BP 110/62 | HR 53 | Temp 98.3°F | Resp 12 | Ht 62.0 in | Wt 189.8 lb

## 2024-02-12 DIAGNOSIS — M549 Dorsalgia, unspecified: Secondary | ICD-10-CM | POA: Diagnosis not present

## 2024-02-12 DIAGNOSIS — Z23 Encounter for immunization: Secondary | ICD-10-CM | POA: Diagnosis not present

## 2024-02-12 DIAGNOSIS — G4733 Obstructive sleep apnea (adult) (pediatric): Secondary | ICD-10-CM

## 2024-02-12 DIAGNOSIS — I10 Essential (primary) hypertension: Secondary | ICD-10-CM

## 2024-02-12 DIAGNOSIS — R739 Hyperglycemia, unspecified: Secondary | ICD-10-CM

## 2024-02-12 DIAGNOSIS — E782 Mixed hyperlipidemia: Secondary | ICD-10-CM

## 2024-02-12 DIAGNOSIS — Z8781 Personal history of (healed) traumatic fracture: Secondary | ICD-10-CM

## 2024-02-12 DIAGNOSIS — E669 Obesity, unspecified: Secondary | ICD-10-CM | POA: Diagnosis not present

## 2024-02-12 DIAGNOSIS — M47816 Spondylosis without myelopathy or radiculopathy, lumbar region: Secondary | ICD-10-CM | POA: Diagnosis not present

## 2024-02-12 MED ORDER — LOSARTAN POTASSIUM 25 MG PO TABS: 25.0000 mg | ORAL_TABLET | Freq: Every day | ORAL | 1 refills | Status: AC

## 2024-02-12 MED ORDER — ROSUVASTATIN CALCIUM 10 MG PO TABS: 10.0000 mg | ORAL_TABLET | Freq: Every day | ORAL | 2 refills | Status: AC

## 2024-02-12 MED ORDER — AMLODIPINE BESYLATE 5 MG PO TABS: ORAL_TABLET | ORAL | 1 refills | Status: AC

## 2024-02-12 MED ORDER — LIDOCAINE 5 % EX PTCH: 1.0000 | MEDICATED_PATCH | Freq: Two times a day (BID) | CUTANEOUS | 1 refills | Status: AC | PRN

## 2024-02-12 MED ORDER — CARVEDILOL 6.25 MG PO TABS
6.2500 mg | ORAL_TABLET | Freq: Two times a day (BID) | ORAL | 1 refills | Status: DC
Start: 2024-02-12 — End: 2024-03-10

## 2024-02-12 MED ORDER — ACETAMINOPHEN 500 MG PO TABS: 500.0000 mg | ORAL_TABLET | Freq: Two times a day (BID) | ORAL | 0 refills | Status: AC

## 2024-02-12 NOTE — Addendum Note (Signed)
 Addended by: TRUDY CURVIN RAMAN on: 02/12/2024 02:21 PM   Modules accepted: Orders

## 2024-02-12 NOTE — Assessment & Plan Note (Signed)
 Encouraged DASH or MIND diet, decrease po intake and increase exercise as tolerated. Needs 7-8 hours of sleep nightly. Avoid trans fats, eat small, frequent meals every 4-5 hours with lean proteins, complex carbs and healthy fats. Minimize simple carbs, high fat foods and processed foods

## 2024-02-12 NOTE — Assessment & Plan Note (Signed)
 hgba1c acceptable, minimize simple carbs. Increase exercise as tolerated.

## 2024-02-12 NOTE — Assessment & Plan Note (Signed)
 Encourage heart healthy diet such as MIND or DASH diet, increase exercise, avoid trans fats, simple carbohydrates and processed foods, consider a krill or fish or flaxseed oil cap daily. Tolerating Rosuvastatin

## 2024-02-12 NOTE — Assessment & Plan Note (Signed)
 Well controlled, no changes to meds. Encouraged heart healthy diet such as the DASH diet and exercise as tolerated.

## 2024-02-12 NOTE — Addendum Note (Signed)
 Addended by: TRUDY CURVIN RAMAN on: 02/12/2024 02:20 PM   Modules accepted: Orders

## 2024-02-12 NOTE — Assessment & Plan Note (Addendum)
 Chronic.  January 2025 Pt had fall resulting in compression fracture at L1.   Pain exacerbated by prolonged sitting and in the morning. Previous vertebral fracture healed. Ibuprofen  and Tylenol  discontinued due to gastrointestinal discomfort.  - Initiate physical therapy for lower back strengthening and flexibility. - Prescribe Tylenol  500 mg twice daily, with option to increase to 1000 mg twice daily if needed. - Prescribe lidocaine  5% patches for use every 12 hours as needed. - Encourage daily light stretching and posture improvement. - Consider Sports Med referral back if pain persists after conservative management. - Advise on weight loss and strength training to reduce joint stress. - Ensure adequate calcium  and vitamin D  intake.

## 2024-02-12 NOTE — Addendum Note (Signed)
 Addended by: ESTELLE GILLIS D on: 02/12/2024 02:34 PM   Modules accepted: Orders

## 2024-02-12 NOTE — Assessment & Plan Note (Signed)
 Wears CPAP.  Follows with pulmonology.

## 2024-02-12 NOTE — Assessment & Plan Note (Signed)
 Supplement and monitor

## 2024-02-13 ENCOUNTER — Other Ambulatory Visit (INDEPENDENT_AMBULATORY_CARE_PROVIDER_SITE_OTHER): Payer: Medicare (Managed Care)

## 2024-02-13 DIAGNOSIS — E782 Mixed hyperlipidemia: Secondary | ICD-10-CM

## 2024-02-13 DIAGNOSIS — I10 Essential (primary) hypertension: Secondary | ICD-10-CM

## 2024-02-13 DIAGNOSIS — R739 Hyperglycemia, unspecified: Secondary | ICD-10-CM

## 2024-02-13 LAB — COMPREHENSIVE METABOLIC PANEL WITH GFR
ALT: 31 U/L (ref 0–35)
AST: 28 U/L (ref 0–37)
Albumin: 4.5 g/dL (ref 3.5–5.2)
Alkaline Phosphatase: 74 U/L (ref 39–117)
BUN: 13 mg/dL (ref 6–23)
CO2: 27 meq/L (ref 19–32)
Calcium: 9.9 mg/dL (ref 8.4–10.5)
Chloride: 102 meq/L (ref 96–112)
Creatinine, Ser: 0.62 mg/dL (ref 0.40–1.20)
GFR: 90.02 mL/min (ref >60.00)
Glucose, Bld: 103 mg/dL — ABNORMAL HIGH (ref 70–99)
Potassium: 4.5 meq/L (ref 3.5–5.1)
Sodium: 138 meq/L (ref 135–145)
Total Bilirubin: 0.6 mg/dL (ref 0.2–1.2)
Total Protein: 7.1 g/dL (ref 6.0–8.3)

## 2024-02-13 LAB — LIPID PANEL
Cholesterol: 160 mg/dL (ref 0–200)
HDL: 53.9 mg/dL (ref >39.00)
LDL Cholesterol: 85 mg/dL (ref 0–99)
NonHDL: 106.38
Total CHOL/HDL Ratio: 3
Triglycerides: 105 mg/dL (ref 0.0–149.0)
VLDL: 21 mg/dL (ref 0.0–40.0)

## 2024-02-13 LAB — HEMOGLOBIN A1C: Hgb A1c MFr Bld: 6.7 % — ABNORMAL HIGH (ref 4.6–6.5)

## 2024-02-13 LAB — TSH: TSH: 1.49 u[IU]/mL (ref 0.35–5.50)

## 2024-02-14 ENCOUNTER — Ambulatory Visit: Payer: Self-pay | Admitting: Student

## 2024-02-18 ENCOUNTER — Ambulatory Visit (INDEPENDENT_AMBULATORY_CARE_PROVIDER_SITE_OTHER): Payer: Medicare (Managed Care) | Admitting: Otolaryngology

## 2024-02-18 ENCOUNTER — Ambulatory Visit (INDEPENDENT_AMBULATORY_CARE_PROVIDER_SITE_OTHER): Payer: Medicare (Managed Care) | Admitting: Audiology

## 2024-02-20 ENCOUNTER — Other Ambulatory Visit (HOSPITAL_COMMUNITY): Payer: Self-pay

## 2024-02-20 ENCOUNTER — Telehealth: Payer: Self-pay

## 2024-02-20 NOTE — Telephone Encounter (Signed)
 Pharmacy Patient Advocate Encounter  Received notification from [employer]that Prior Authorization for Lidocaine  5% patches  has been APPROVED from 01/21/24 to 02/19/25. Ran test claim, Copay is $44.12. This test claim was processed through [facility]- copay amounts may vary at other pharmacies due to pharmacy/plan contracts, or as the patient moves through the different stages of their insurance plan.   PA #/Case ID/Reference #: [id number]

## 2024-02-20 NOTE — Telephone Encounter (Signed)
 Pharmacy Patient Advocate Encounter   Received notification from Onbase that prior authorization for Lidocaine  5% patches  is required/requested.   Insurance verification completed.   The patient is insured through [employer] .   Per test claim: PA required; PA submitted to above mentioned insurance via Latent Key/confirmation #/EOC [facility] Status is pending

## 2024-03-05 ENCOUNTER — Telehealth (HOSPITAL_BASED_OUTPATIENT_CLINIC_OR_DEPARTMENT_OTHER): Payer: Self-pay

## 2024-03-09 ENCOUNTER — Other Ambulatory Visit: Payer: Self-pay | Admitting: Family

## 2024-03-09 DIAGNOSIS — I10 Essential (primary) hypertension: Secondary | ICD-10-CM

## 2024-03-11 ENCOUNTER — Ambulatory Visit: Payer: Medicare (Managed Care) | Admitting: Physical Therapy

## 2024-03-14 NOTE — Therapy (Signed)
 OUTPATIENT PHYSICAL THERAPY THORACOLUMBAR EVALUATION   Patient Name: Margaret [name] MRN: [id number] DOB:[birth year], 73 y.o., female Today's Date: 03/18/2024  END OF SESSION:  PT End of Session - 03/18/24 1314     Visit Number 1    Date for Recertification  05/13/24    Authorization Type [employer]    Progress Note Due on Visit 10    PT Start Time 1314    PT Stop Time 1409    PT Time Calculation (min) 55 min    Activity Tolerance Patient tolerated treatment well    Behavior During Therapy [facility] for tasks assessed/performed          Past Medical History:  Diagnosis Date   Allergic state 03/25/2012   Allergy    Aortic calcification 08/27/2014   Atherosclerosis of aorta 04/12/2014   Back pain    Constipation 01/23/2017   COVID-19 02/21/2021   GERD (gastroesophageal reflux disease)    Hiatal hernia with gastroesophageal reflux 04/27/2010   Qualifier: Diagnosis of  By: Anice CMA, Darlene  Failed Omeprazole  and Protonix     Hx of adenomatous polyp of colon 01/23/2017   Colonoscopy 01/16/2017 with 2 polyps one was adenomatous. recommeded follow up in 5 years.performed by Dr Shila   Hyperlipidemia    Hypertension    Joint pain    Osteopenia 08/31/2012   Peripheral edema 12/13/2014   Sleep apnea    Unspecified sinusitis (chronic) 03/30/2013   Past Surgical History:  Procedure Laterality Date   24 HOUR PH STUDY N/A 12/15/2015   Procedure: 24 HOUR PH STUDY;  Surgeon: Gustav LULLA Shila, MD;  Location: [facility];  Service: Endoscopy;  Laterality: N/A;   DILATION AND CURETTAGE OF UTERUS     ESOPHAGEAL MANOMETRY N/A 12/15/2015   Procedure: ESOPHAGEAL MANOMETRY (EM);  Surgeon: Gustav LULLA Shila, MD;  Location: [facility];  Service: Endoscopy;  Laterality: N/A;   Patient Active Problem List   Diagnosis Date Noted   Obesity (BMI 30-39.9) 02/12/2024   Subconjunctival hemorrhage of left eye 08/07/2022   Pain in both knees 04/25/2022   Heme positive stool 04/25/2022    Abdominal pain 01/13/2022   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   LAD (lymphadenopathy), anterior cervical 07/10/2021   Vertigo 05/17/2021   Facet arthritis of lumbar region 04/07/2021   UTI (urinary tract infection) 08/01/2020   Muscle cramps 06/24/2020   Insomnia 05/29/2019   Educated about COVID-19 virus infection 09/20/2018   Headache 04/30/2018   Other fatigue 04/03/2018   Dyspnea 04/03/2018   Obstructive sleep apnea syndrome 04/03/2018   Vitamin D  deficiency 04/03/2018   Obesity 01/22/2018   Neck pain 12/16/2017   Fatty infiltration of liver 12/16/2017   Hx of colonic polyps 01/23/2017   Constipation 01/23/2017   Cervical radiculopathy    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   TIA (transient ischemic attack) 09/06/2016   Right hand weakness    Peripheral edema 12/13/2014   Aortic atherosclerosis (HCC) 04/12/2014   Skin lesion of back 03/31/2014   Joint stiffness 03/31/2014   Essential hypertension 02/08/2014   Atypical chest pain 01/21/2014   Hyperglycemia 07/27/2013   Preventative health care 03/30/2013   Left shoulder pain 11/14/2012   Osteopenia 08/31/2012   Back pain 03/25/2012   Patellofemoral pain syndrome of right knee 03/25/2012   Allergy 03/25/2012   Helicobacter pylori infection 06/08/2010   Hiatal hernia with gastroesophageal reflux 04/27/2010   Hyperlipidemia, mixed 04/07/2010    PCP: Domenica Harlene LABOR, MD   REFERRING PROVIDER: Wheeler Harlene CROME, NP  REFERRING DIAG: M54.9 (ICD-10-CM) - Back pain, unspecified back location, unspecified back pain laterality, unspecified chronicity   THERAPY DIAG:  Other low back pain  Abnormal posture  Muscle spasm of back  Difficulty in walking, not elsewhere classified  RATIONALE FOR EVALUATION AND TREATMENT: Rehabilitation  ONSET DATE: Chronic over several years, exacerbated since fall with L1 compression fracture in January 2025  NEXT MD VISIT: 04/11/24 - Harlene Horton, MD 05/06/24 - Katelyn Moffo, PA & Donnajean Eddy, MD   SUBJECTIVE:                                                                                                                                                                                                         SUBJECTIVE STATEMENT: Pt reports a fall in January resulting in a compression fracture in her back which has exacerbated her chronic LBP.  Recent bone density study revealed degeneration of her bones with some osteopenia.  Back pain is daily but degree of pain changes with activity.  Back pain radiates around into pelvis but no numbness and tingling or change in bowel and bladder control other than increased awareness of urgency related to bladder.  Patient denies LE radicular pain.  PAIN: Are you having pain? Yes: NPRS scale: 5-6/10 currently, up to 8-9/10 at worst (getting up in the morning) Pain location: Midline and B low back wrapping around into pelvis  Pain description: shooting, sore, tightness  Aggravating factors: prolonged sitting or standing  Relieving factors: massage, hot pack   PERTINENT HISTORY:  L1 compression fracture s/p fall 05/31/23, osteopenia, lumbar facet arthritis, cervical radiculopathy, B knee pain, vertigo, HTN, CVA/TIA, OSA, hiatal hernia with GERD, peripheral edema  PRECAUTIONS: Back and Other: osteopenia  RED FLAGS: Bowel or bladder incontinence: Yes: increased urgency & bloating but no loss of control   WEIGHT BEARING RESTRICTIONS: No  FALLS:  Has patient fallen in last 6 months? No  LIVING ENVIRONMENT: Lives with: lives with their family and lives with their spouse Lives in: House Stairs: Yes: Internal: 15-17 steps; on right going up and External: 1 steps; none Has following equipment at home: None  OCCUPATION: Retired  PLOF: Independent and Leisure: traveling, some stretching   PATIENT GOALS: To be able to help manage my back pain on my own, at home and when traveling.   OBJECTIVE: (objective measures completed at  initial evaluation unless otherwise dated)  DIAGNOSTIC FINDINGS:  09/28/23 - DUAL X-RAY ABSORPTIOMETRY (DXA) FOR BONE MINERAL DENSITY  Osteopenia based on BMD.   Fracture risk is increased. Increased risk is based on  low BMD, and  history of vertebral fracture.   RECOMMENDATIONS:  1. All patients should optimize calcium  and vitamin D  intake.   2. Consider FDA-approved medical therapies in postmenopausal women  and men aged 81 years and older, based on the following:   - A hip or vertebral (clinical or morphometric) fracture   - T-score less than or equal to -2.5 and secondary causes have been  excluded.   - Low bone mass (T-score between -1.0 and -2.5) and a 10-year  probability of a hip fracture greater than or equal to 3% or a  10-year probability of a major osteoporosis-related fracture greater  than or equal to 20% based on the [place] -adapted WHO algorithm.   - Clinician judgment and/or patient preferences may indicate  treatment for people with 10-year fracture probabilities above or  below these levels   3. Patients with diagnosis of osteoporosis or at high risk for  fracture should have regular bone mineral density tests. For  patients eligible for Medicare, routine testing is allowed once  every 2 years. The testing frequency can be increased to one year  for patients who have rapidly progressing disease, those who are  receiving or discontinuing medical therapy to restore bone mass, or  have additional risk factors.   05/31/23 - CT LUMBAR SPINE WITHOUT CONTRAST  FINDINGS:  Segmentation: 5 lumbar type vertebrae.   Alignment: Chronic trace anterolisthesis of L4 on L5.   Vertebrae: Acute L1 superior endplate compression fracture with  5-10% vertebral body height loss. No suspicious bone lesion.   Paraspinal and other soft tissues: Hepatic steatosis. Abdominal  aortic atherosclerosis without aneurysm. Proximally 4 cm left-sided  uterine fibroid, similar to the prior CT.    Disc levels: Mild lumbar spondylosis and advanced multilevel facet  arthrosis. No evidence of high-grade stenosis.   IMPRESSION:  1. Acute L1 compression fracture.  2.  Aortic Atherosclerosis (ICD10-I70.0).   04/17/23 - LUMBAR SPINE - COMPLETE 4+ VIEW FINDINGS: Osteopenia. There are five non-rib bearing lumbar-type vertebral bodies. There is levocurvature of the lower lumbar spine and dextrocurvature of the thoracolumbar junction. There is trace retrolisthesis of L5-S1. There is no evidence for acute fracture or subluxation. There is mild intervertebral disc space height loss at L5-S1. Posterior disc osteophyte complex at L1-2 and T12-L1. Severe lower lumbar facet arthropathy, LEFT greater than RIGHT. Atherosclerotic calcifications of the aorta.   IMPRESSION: Severe lower lumbar facet arthropathy.  PATIENT SURVEYS:  Modified Oswestry:  MODIFIED OSWESTRY DISABILITY SCALE  Date:  03/18/2024   Pain intensity 4 =  Pain medication provides me with little relief from pain.  2. Personal care (washing, dressing, etc.) 1 =  I can take care of myself normally, but it increases my pain.  3. Lifting 4 = I can lift only very light weights  4. Walking 3 =  Pain prevents me from walking more than  mile.  5. Sitting 0 =  I can sit in any chair as long as I like.  6. Standing 2 =  Pain prevents me from standing more than 1 hour  7. Sleeping 1 = I can sleep well only by using pain medication.  8. Social Life 2 = Pain prevents me from participating in more energetic activities (eg. sports, dancing).  9. Traveling 2 =  My pain restricts my travel over 2 hours.  10. Employment/ Homemaking 3 = Pain prevents me from doing anything but light duties.  Total 21/50  % Disability 42.0 % - severe  Interpretation of scores: Score Category Description  0-20% Minimal Disability The patient can cope with most living activities. Usually no treatment is indicated apart from advice on lifting, sitting and  exercise  21-40% Moderate Disability The patient experiences more pain and difficulty with sitting, lifting and standing. Travel and social life are more difficult and they may be disabled from work. Personal care, sexual activity and sleeping are not grossly affected, and the patient can usually be managed by conservative means  41-60% Severe Disability Pain remains the main problem in this group, but activities of daily living are affected. These patients require a detailed investigation  61-80% Crippled Back pain impinges on all aspects of the patient's life. Positive intervention is required  81-100% Bed-bound These patients are either bed-bound or exaggerating their symptoms  Bluford FORBES Zoe DELENA Karon DELENA, et al. Surgery versus conservative management of stable thoracolumbar fracture: the PRESTO feasibility RCT. [place]): [employer]; 2021 Nov. [facility] Technology Assessment, No. 25.62.) Appendix 3, Oswestry Disability Index category descriptors. Available from: FindJewelers.cz  Minimally Clinically Important Difference (MCID) = 12.8%  SCREENING FOR RED FLAGS: Bowel or bladder incontinence: Yes: Awareness of increased urgency & bloating but no loss of control Spinal tumors: No Cauda equina syndrome: No Compression fracture: No Abdominal aneurysm: No  COGNITION:  Overall cognitive status: Within functional limits for tasks assessed    SENSATION: WFL  POSTURE:  rounded shoulders, forward head, increased lumbar lordosis, increased thoracic kyphosis, weight shift right, and pronounced scoliosis - levocurvature of the lower lumbar spine and dextrocurvature of the thoracolumbar junction  PALPATION: Increased muscle tension and TTP in B lumbar paraspinals, glutes and piriformis  LUMBAR ROM:   Active  Eval  Flexion Hands to distal shins  Extension 75% limited  Right lateral flexion WFL - Hands to distal shin  Left lateral flexion WFL -  Hands to distal shin  Right rotation 40% limited  Left rotation 40% limited  (Blank rows = not tested)  MUSCLE LENGTH: Hamstrings: WFL ITB: mild/mod tight B Piriformis: mild/mod tight B Hip flexors: mod tight B Quads: mild/mod tight B Heelcord:   LOWER EXTREMITY ROM:    Grossly WFL with restrictions as indicated above due to muscle tightness  LOWER EXTREMITY MMT:    MMT Right eval Left eval  Hip flexion 4+ 4  Hip extension 4- 4  Hip abduction 3+ 3+  Hip adduction 4- 4-  Hip internal rotation 4+ 4+  Hip external rotation 4- 4-  Knee flexion 4 4  Knee extension 4+ 4+  Ankle dorsiflexion 4 4  Ankle plantarflexion 4- 4-  Ankle inversion    Ankle eversion     (Blank rows = not tested)  FUNCTIONAL TESTS:  5 times sit to stand: 19.62 sec with hands on thighs  GAIT: Distance walked: Clinic distances Assistive device utilized: None Level of assistance: Complete Independence Gait pattern: lateral lean- Right and trunk flexed   TODAY'S TREATMENT:   03/18/2024 - Eval SELF CARE:  Reviewed eval findings and role of PT in addressing identified deficits as well as instruction in initial HEP (see below).    PATIENT EDUCATION:  Education details: PT eval findings, anticipated POC, initial HEP, and logrolling through side-lying for supine to sit to reduce lumbar strain for pain management and reduced risk for osteoporotic fracture   Person educated: Patient Education method: Explanation, Demonstration, Verbal cues, and Handouts Education comprehension: verbalized understanding, returned demonstration, verbal cues required, and needs further education  HOME EXERCISE PROGRAM: Access Code:  [email or web address] URL: [email or web address] Date: 03/18/2024 Prepared by: Elijah Hidden  Exercises - Supine Lower Trunk Rotation  - 2 x daily - 7 x weekly - 5 reps - 10 sec hold - Supine Figure 4 Piriformis Stretch  - 2 x daily - 7 x weekly - 3 reps - 30 sec hold - Hip Flexor Stretch  with Strap on Table  - 2 x daily - 7 x weekly - 3 reps - 30 sec hold   ASSESSMENT:  CLINICAL IMPRESSION: Margaret [name] is a 73 y.o. female who was referred to physical therapy for evaluation and treatment for acute on chronic low back pain.  She has also recently been diagnosed with osteopenia.  Patient reports exacerbation of chronic LBP beginning after a fall resulting in L1 compression fracture in January of this year.  Pain is worse with prolonged sitting or standing.  Patient has deficits in lumbar ROM, proximal LE flexibility, core and B LE strength, abnormal posture secondary to severe scoliosis, and TTP with abnormal muscle tension in B lumbar paraspinals, glutes and piriformis which are interfering with ADLs and are impacting quality of life.  On Modified Oswestry patient scored 21/50 demonstrating 42% or severe disability.  Margaret [name] will benefit from skilled PT to address above deficits to improve mobility and activity tolerance with decreased pain interference.     OBJECTIVE IMPAIRMENTS: Abnormal gait, decreased activity tolerance, decreased mobility, difficulty walking, decreased ROM, decreased strength, decreased safety awareness, increased fascial restrictions, impaired perceived functional ability, increased muscle spasms, impaired flexibility, improper body mechanics, postural dysfunction, and pain.   ACTIVITY LIMITATIONS: carrying, lifting, bending, sitting, standing, squatting, sleeping, transfers, locomotion level, and caring for others  PARTICIPATION LIMITATIONS: meal prep, cleaning, laundry, driving, and community activity  PERSONAL FACTORS: Age, Fitness, Past/current experiences, Time since onset of injury/illness/exacerbation, and 3+ comorbidities: L1 compression fracture s/p fall 05/31/23, osteopenia, lumbar facet arthritis, cervical radiculopathy, B knee pain, vertigo, HTN, CVA/TIA, OSA, hiatal hernia with GERD, peripheral edema are also affecting patient's functional  outcome.   REHAB POTENTIAL: Good  CLINICAL DECISION MAKING: Evolving/moderate complexity  EVALUATION COMPLEXITY: Moderate   GOALS: Goals reviewed with patient? Yes  SHORT TERM GOALS: Target date: 04/15/2024  Patient will be independent with initial HEP to improve outcomes and carryover.  Baseline: Initial HEP provided on eval Goal status: INITIAL  2.  Patient will report 25% improvement in low back pain to improve QOL. Baseline: 5-6/10 on eval, up to 8-9/10 at worst (getting up in the morning) Goal status: INITIAL  LONG TERM GOALS: Target date: 05/13/2024  Patient will be independent with ongoing/advanced HEP for self-management at home.  Baseline:  Goal status: INITIAL  2.  Patient will report 50-75% improvement in low back pain to improve QOL.  Baseline: 5-6/10 on eval, up to 8-9/10 at worst (getting up in the morning) Goal status: INITIAL  3.  Patient to demonstrate ability to achieve and maintain good spinal alignment/posturing and body mechanics needed for daily activities to reduce stressors placed on low back musculature and reduce risk for osteoporotic fracture. Baseline:  Goal status: INITIAL  4.  Patient will demonstrate functional pain free lumbar ROM to perform ADLs.   Baseline: Refer to above lumbar ROM table Goal status: INITIAL  5.  Patient will demonstrate improved B LE strength to >/= 4+/5 for improved stability and ease of mobility. Baseline: Refer to above LE MMT table Goal status: INITIAL  6. Patient will report </= 30% on Modified Oswestry (MCID = 12%) to demonstrate improved functional  ability with decreased pain interference. Baseline: 21 / 50 = 42.0 % Goal status: INITIAL  7.  Patient will demonstrate improved functional LE strength by completing 5xSTS and <15 seconds without need for UE assist on thighs. Baseline: 19.62 seconds with B UE assist pushing from thighs Goal status: INITIAL   PLAN:  PT FREQUENCY: 2x/week  PT DURATION: 8  weeks  PLANNED INTERVENTIONS: 97164- PT Re-evaluation, 97750- Physical Performance Testing, 97110-Therapeutic exercises, 97530- Therapeutic activity, 97112- Neuromuscular re-education, 97535- Self Care, [id number]- Manual therapy, [phone]- Gait training, [phone]- Aquatic Therapy, [phone]- Electrical stimulation (unattended), [phone]- Ultrasound, [id number]- Traction (mechanical), [id number]- Ionotophoresis 4mg /ml Dexamethasone, 79439 (1-2 muscles), [id number] (3+ muscles)- Dry Needling, Patient/Family education, Balance training, Stair training, Taping, Joint mobilization, Spinal mobilization, Cryotherapy, and Moist heat  PLAN FOR NEXT SESSION: Provide education on posture and body mechanics to reduce lumbar strain and risk for osteoporotic fractures; review initial HEP; gently progress lumbopelvic flexibility/stretching/ROM and initiate core/lumbopelvic stabilization and strengthening, updating HEP as appropriate; MT +/- TPDN and modalities as needed for pain management   Elijah CHRISTELLA Hidden, PT 03/18/2024, 6:18 PM

## 2024-03-15 ENCOUNTER — Ambulatory Visit (HOSPITAL_BASED_OUTPATIENT_CLINIC_OR_DEPARTMENT_OTHER)
Admission: RE | Admit: 2024-03-15 | Discharge: 2024-03-15 | Disposition: A | Payer: Medicare (Managed Care) | Source: Ambulatory Visit | Attending: Family Medicine | Admitting: Family Medicine

## 2024-03-15 DIAGNOSIS — Z1231 Encounter for screening mammogram for malignant neoplasm of breast: Secondary | ICD-10-CM | POA: Diagnosis not present

## 2024-03-18 ENCOUNTER — Other Ambulatory Visit: Payer: Self-pay

## 2024-03-18 ENCOUNTER — Ambulatory Visit: Payer: Medicare (Managed Care) | Attending: Student | Admitting: Physical Therapy

## 2024-03-18 ENCOUNTER — Encounter: Payer: Self-pay | Admitting: Physical Therapy

## 2024-03-18 DIAGNOSIS — R293 Abnormal posture: Secondary | ICD-10-CM | POA: Diagnosis not present

## 2024-03-18 DIAGNOSIS — M5459 Other low back pain: Secondary | ICD-10-CM | POA: Insufficient documentation

## 2024-03-18 DIAGNOSIS — M549 Dorsalgia, unspecified: Secondary | ICD-10-CM | POA: Diagnosis not present

## 2024-03-18 DIAGNOSIS — M62830 Muscle spasm of back: Secondary | ICD-10-CM | POA: Insufficient documentation

## 2024-03-18 DIAGNOSIS — R262 Difficulty in walking, not elsewhere classified: Secondary | ICD-10-CM | POA: Diagnosis not present

## 2024-03-25 ENCOUNTER — Encounter: Payer: Self-pay | Admitting: Physical Therapy

## 2024-03-25 ENCOUNTER — Ambulatory Visit: Payer: Medicare (Managed Care) | Admitting: Physical Therapy

## 2024-03-25 DIAGNOSIS — M5459 Other low back pain: Secondary | ICD-10-CM

## 2024-03-25 DIAGNOSIS — R262 Difficulty in walking, not elsewhere classified: Secondary | ICD-10-CM

## 2024-03-25 DIAGNOSIS — R293 Abnormal posture: Secondary | ICD-10-CM

## 2024-03-25 DIAGNOSIS — M62830 Muscle spasm of back: Secondary | ICD-10-CM

## 2024-03-25 NOTE — Therapy (Addendum)
 OUTPATIENT PHYSICAL THERAPY TREATMENT   Patient Name: Margaret [name] MRN: [id number] DOB:Aug 20, 1950, 73 y.o., female Today's Date: 03/25/2024  END OF SESSION:  PT End of Session - 03/25/24 1314     Visit Number 2    Date for Recertification  05/13/24    Authorization Type [employer]    Progress Note Due on Visit 10    PT Start Time 1314    PT Stop Time 1407    PT Time Calculation (min) 53 min    Activity Tolerance Patient tolerated treatment well    Behavior During Therapy [facility] for tasks assessed/performed           Past Medical History:  Diagnosis Date   Allergic state 03/25/2012   Allergy    Aortic calcification 08/27/2014   Atherosclerosis of aorta 04/12/2014   Back pain    Constipation 01/23/2017   COVID-19 02/21/2021   GERD (gastroesophageal reflux disease)    Hiatal hernia with gastroesophageal reflux 04/27/2010   Qualifier: Diagnosis of  By: Anice CMA, Darlene  Failed Omeprazole  and Protonix     Hx of adenomatous polyp of colon 01/23/2017   Colonoscopy 01/16/2017 with 2 polyps one was adenomatous. recommeded follow up in 5 years.performed by Dr Shila   Hyperlipidemia    Hypertension    Joint pain    Osteopenia 08/31/2012   Peripheral edema 12/13/2014   Sleep apnea    Unspecified sinusitis (chronic) 03/30/2013   Past Surgical History:  Procedure Laterality Date   24 HOUR PH STUDY N/A 12/15/2015   Procedure: 24 HOUR PH STUDY;  Surgeon: Gustav LULLA Shila, MD;  Location: [facility];  Service: Endoscopy;  Laterality: N/A;   DILATION AND CURETTAGE OF UTERUS     ESOPHAGEAL MANOMETRY N/A 12/15/2015   Procedure: ESOPHAGEAL MANOMETRY (EM);  Surgeon: Gustav LULLA Shila, MD;  Location: [facility];  Service: Endoscopy;  Laterality: N/A;   Patient Active Problem List   Diagnosis Date Noted   Obesity (BMI 30-39.9) 02/12/2024   Subconjunctival hemorrhage of left eye 08/07/2022   Pain in both knees 04/25/2022   Heme positive stool 04/25/2022   Abdominal  pain 01/13/2022   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   LAD (lymphadenopathy), anterior cervical 07/10/2021   Vertigo 05/17/2021   Facet arthritis of lumbar region 04/07/2021   UTI (urinary tract infection) 08/01/2020   Muscle cramps 06/24/2020   Insomnia 05/29/2019   Educated about COVID-19 virus infection 09/20/2018   Headache 04/30/2018   Other fatigue 04/03/2018   Dyspnea 04/03/2018   Obstructive sleep apnea syndrome 04/03/2018   Vitamin D  deficiency 04/03/2018   Obesity 01/22/2018   Neck pain 12/16/2017   Fatty infiltration of liver 12/16/2017   Hx of colonic polyps 01/23/2017   Constipation 01/23/2017   Cervical radiculopathy    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   TIA (transient ischemic attack) 09/06/2016   Right hand weakness    Peripheral edema 12/13/2014   Aortic atherosclerosis (HCC) 04/12/2014   Skin lesion of back 03/31/2014   Joint stiffness 03/31/2014   Essential hypertension 02/08/2014   Atypical chest pain 01/21/2014   Hyperglycemia 07/27/2013   Preventative health care 03/30/2013   Left shoulder pain 11/14/2012   Osteopenia 08/31/2012   Back pain 03/25/2012   Patellofemoral pain syndrome of right knee 03/25/2012   Allergy 03/25/2012   Helicobacter pylori infection 06/08/2010   Hiatal hernia with gastroesophageal reflux 04/27/2010   Hyperlipidemia, mixed 04/07/2010    PCP: Domenica Harlene LABOR, MD   REFERRING PROVIDER: Wheeler Harlene CROME, NP  REFERRING DIAG: M54.9 (ICD-10-CM) - Back pain, unspecified back location, unspecified back pain laterality, unspecified chronicity   THERAPY DIAG:  Other low back pain  Abnormal posture  Muscle spasm of back  Difficulty in walking, not elsewhere classified  RATIONALE FOR EVALUATION AND TREATMENT: Rehabilitation  ONSET DATE: Chronic over several years, exacerbated since fall with L1 compression fracture in January 2025  NEXT MD VISIT: 04/11/24 - Harlene Horton, MD 05/06/24 - Katelyn Moffo, PA & Donnajean Eddy,  MD   SUBJECTIVE:                                                                                                                                                                                                         SUBJECTIVE STATEMENT: Pt reports her pain level remains consistent at 5-6/10 - it just lives there.  She reports she has not been performing the HEP as she has been traveling, but states she has just been doing what she normally does for exercise.  EVAL: Pt reports a fall in January resulting in a compression fracture in her back which has exacerbated her chronic LBP.  Recent bone density study revealed degeneration of her bones with some osteopenia.  Back pain is daily but degree of pain changes with activity.  Back pain radiates around into pelvis but no numbness and tingling or change in bowel and bladder control other than increased awareness of urgency related to bladder.  Patient denies LE radicular pain.  PAIN: Are you having pain? Yes: NPRS scale: 5-6/10  Pain location: Midline and B low back wrapping around into pelvis  Pain description: shooting, sore, tightness  Aggravating factors: prolonged sitting or standing  Relieving factors: massage, hot pack   PERTINENT HISTORY:  L1 compression fracture s/p fall 05/31/23, osteopenia, lumbar facet arthritis, cervical radiculopathy, B knee pain, vertigo, HTN, CVA/TIA, OSA, hiatal hernia with GERD, peripheral edema  PRECAUTIONS: Back and Other: osteopenia  RED FLAGS: Bowel or bladder incontinence: Yes: increased urgency & bloating but no loss of control   WEIGHT BEARING RESTRICTIONS: No  FALLS:  Has patient fallen in last 6 months? No  LIVING ENVIRONMENT: Lives with: lives with their family and lives with their spouse Lives in: House Stairs: Yes: Internal: 15-17 steps; on right going up and External: 1 steps; none Has following equipment at home: None  OCCUPATION: Retired  PLOF: Independent and Leisure: traveling,  some stretching   PATIENT GOALS: To be able to help manage my back pain on my own, at home and when traveling.   OBJECTIVE: (objective measures completed at initial  evaluation unless otherwise dated)  DIAGNOSTIC FINDINGS:  09/28/23 - DUAL X-RAY ABSORPTIOMETRY (DXA) FOR BONE MINERAL DENSITY  Osteopenia based on BMD.   Fracture risk is increased. Increased risk is based on low BMD, and  history of vertebral fracture.   RECOMMENDATIONS:  1. All patients should optimize calcium  and vitamin D  intake.   2. Consider FDA-approved medical therapies in postmenopausal women  and men aged 44 years and older, based on the following:   - A hip or vertebral (clinical or morphometric) fracture   - T-score less than or equal to -2.5 and secondary causes have been  excluded.   - Low bone mass (T-score between -1.0 and -2.5) and a 10-year  probability of a hip fracture greater than or equal to 3% or a  10-year probability of a major osteoporosis-related fracture greater  than or equal to 20% based on the [place] -adapted WHO algorithm.   - Clinician judgment and/or patient preferences may indicate  treatment for people with 10-year fracture probabilities above or  below these levels   3. Patients with diagnosis of osteoporosis or at high risk for  fracture should have regular bone mineral density tests. For  patients eligible for Medicare, routine testing is allowed once  every 2 years. The testing frequency can be increased to one year  for patients who have rapidly progressing disease, those who are  receiving or discontinuing medical therapy to restore bone mass, or  have additional risk factors.   05/31/23 - CT LUMBAR SPINE WITHOUT CONTRAST  FINDINGS:  Segmentation: 5 lumbar type vertebrae.   Alignment: Chronic trace anterolisthesis of L4 on L5.   Vertebrae: Acute L1 superior endplate compression fracture with  5-10% vertebral body height loss. No suspicious bone lesion.   Paraspinal and  other soft tissues: Hepatic steatosis. Abdominal  aortic atherosclerosis without aneurysm. Proximally 4 cm left-sided  uterine fibroid, similar to the prior CT.   Disc levels: Mild lumbar spondylosis and advanced multilevel facet  arthrosis. No evidence of high-grade stenosis.   IMPRESSION:  1. Acute L1 compression fracture.  2.  Aortic Atherosclerosis (ICD10-I70.0).   04/17/23 - LUMBAR SPINE - COMPLETE 4+ VIEW FINDINGS: Osteopenia. There are five non-rib bearing lumbar-type vertebral bodies. There is levocurvature of the lower lumbar spine and dextrocurvature of the thoracolumbar junction. There is trace retrolisthesis of L5-S1. There is no evidence for acute fracture or subluxation. There is mild intervertebral disc space height loss at L5-S1. Posterior disc osteophyte complex at L1-2 and T12-L1. Severe lower lumbar facet arthropathy, LEFT greater than RIGHT. Atherosclerotic calcifications of the aorta.   IMPRESSION: Severe lower lumbar facet arthropathy.  PATIENT SURVEYS:  Modified Oswestry:  MODIFIED OSWESTRY DISABILITY SCALE  Date:  03/18/2024   Pain intensity 4 =  Pain medication provides me with little relief from pain.  2. Personal care (washing, dressing, etc.) 1 =  I can take care of myself normally, but it increases my pain.  3. Lifting 4 = I can lift only very light weights  4. Walking 3 =  Pain prevents me from walking more than  mile.  5. Sitting 0 =  I can sit in any chair as long as I like.  6. Standing 2 =  Pain prevents me from standing more than 1 hour  7. Sleeping 1 = I can sleep well only by using pain medication.  8. Social Life 2 = Pain prevents me from participating in more energetic activities (eg. sports, dancing).  9. Traveling 2 =  My  pain restricts my travel over 2 hours.  10. Employment/ Homemaking 3 = Pain prevents me from doing anything but light duties.  Total 21/50  % Disability 42.0 % - severe   Interpretation of scores: Score Category  Description  0-20% Minimal Disability The patient can cope with most living activities. Usually no treatment is indicated apart from advice on lifting, sitting and exercise  21-40% Moderate Disability The patient experiences more pain and difficulty with sitting, lifting and standing. Travel and social life are more difficult and they may be disabled from work. Personal care, sexual activity and sleeping are not grossly affected, and the patient can usually be managed by conservative means  41-60% Severe Disability Pain remains the main problem in this group, but activities of daily living are affected. These patients require a detailed investigation  61-80% Crippled Back pain impinges on all aspects of the patient's life. Positive intervention is required  81-100% Bed-bound These patients are either bed-bound or exaggerating their symptoms  Bluford FORBES Margaret [name], et al. Surgery versus conservative management of stable thoracolumbar fracture: the PRESTO feasibility RCT. [place] ([place]): [employer]; 2021 Nov. [employer] [facility] [employer] [facility] Technology Assessment, No. 25.62.) Appendix 3, Oswestry Disability Index category descriptors. Available from: Findjewelers.cz  Minimally Clinically Important Difference (MCID) = 12.8%  SCREENING FOR RED FLAGS: Bowel or bladder incontinence: Yes: Awareness of increased urgency & bloating but no loss of control Spinal tumors: No Cauda equina syndrome: No Compression fracture: No Abdominal aneurysm: No  COGNITION:  Overall cognitive status: Within functional limits for tasks assessed    SENSATION: WFL  POSTURE:  rounded shoulders, forward head, increased lumbar lordosis, increased thoracic kyphosis, weight shift right, and pronounced scoliosis - levocurvature of the lower lumbar spine and dextrocurvature of the thoracolumbar junction  PALPATION: Increased muscle tension and TTP in B lumbar paraspinals, glutes and  piriformis  LUMBAR ROM:   Active  Eval  Flexion Hands to distal shins  Extension 75% limited  Right lateral flexion WFL - Hands to distal shin  Left lateral flexion WFL - Hands to distal shin  Right rotation 40% limited  Left rotation 40% limited  (Blank rows = not tested)  MUSCLE LENGTH: Hamstrings: WFL ITB: mild/mod tight B Piriformis: mild/mod tight B Hip flexors: mod tight B Quads: mild/mod tight B Heelcord:   LOWER EXTREMITY ROM:    Grossly WFL with restrictions as indicated above due to muscle tightness  LOWER EXTREMITY MMT:    MMT Right eval Left eval  Hip flexion 4+ 4  Hip extension 4- 4  Hip abduction 3+ 3+  Hip adduction 4- 4-  Hip internal rotation 4+ 4+  Hip external rotation 4- 4-  Knee flexion 4 4  Knee extension 4+ 4+  Ankle dorsiflexion 4 4  Ankle plantarflexion 4- 4-  Ankle inversion    Ankle eversion     (Blank rows = not tested)  FUNCTIONAL TESTS:  5 times sit to stand: 19.62 sec with hands on thighs  GAIT: Distance walked: Clinic distances Assistive device utilized: None Level of assistance: Complete Independence Gait pattern: lateral lean- Right and trunk flexed   TODAY'S TREATMENT:   03/25/2024  THERAPEUTIC EXERCISE: To improve strength, endurance, ROM, and flexibility.  Demonstration, verbal and tactile cues throughout for technique.  NuStep - L3 x 6 min (UE/LE) LTR 10 x 5 Hooklying figure-4 piriformis stretch 3 x 30 bil Mod thomas hip flexor stretch with strap 3 x 30 bil  Hooklying TrA isometric/PPT 10 x 5 Hooklying TrA/PPT +  hip ADD ball squeeze isometric 10 x 5 Hooklying TrA/PPT + hip ABD/ER clam into looped RTB at distal thighs 10 x 5  SELF CARE: Provided education to increase independence with ADLs and to facilitate performance of basic household cleaning/chores. Provided education in proper posture and body mechanics for typical daily positioning and household chores to minimize strain on low back and reduce risk for  osteoporotic fractures.   03/18/2024 - Eval SELF CARE:  Reviewed eval findings and role of PT in addressing identified deficits as well as instruction in initial HEP (see below).    PATIENT EDUCATION:  Education details: HEP review, HEP update, postural awareness, posture and body mechanics for typical daily postioning, mobility and household tasks, and movement/exercise strategies to reduce risk for osteoporotic fracture   Person educated: Patient Education method: Explanation, Demonstration, Verbal cues, and Handouts Education comprehension: verbalized understanding, returned demonstration, verbal cues required, and needs further education  HOME EXERCISE PROGRAM: Access Code: [email or web address] URL: [email or web address] Date: 03/25/2024 Prepared by: Margaret [name] Hidden  Exercises - Supine Lower Trunk Rotation  - 2 x daily - 7 x weekly - 5 reps - 10 sec hold - Supine Figure 4 Piriformis Stretch  - 2 x daily - 7 x weekly - 3 reps - 30 sec hold - Hip Flexor Stretch with Strap on Table  - 2 x daily - 7 x weekly - 3 reps - 30 sec hold - Supine Posterior Pelvic Tilt  - 1 x daily - 7 x weekly - 2 sets - 10 reps - 5 sec hold - Supine Hip Adduction Isometric with Ball  - 1 x daily - 7 x weekly - 2 sets - 10 reps - 5 sec hold - Hooklying Clamshell with Resistance  - 1 x daily - 7 x weekly - 2 sets - 10 reps - 3-5 sec hold  Patient Education - Posture and Body Mechanics - Do it right & prevent fractures - Osteoporosis education   ASSESSMENT:  CLINICAL IMPRESSION: Margaret [name] reports her pain is unchanged from the initial eval but admits to limited performance of the prescribed HEP.  Education provided in proper posture and body mechanics for typical daily positioning and household chores to minimize strain on low back as well as reduce risk for osteoporotic fractures.  Reviewed initial HEP and progressed exercises to initiate core stabilization/strengthening with HEP updated accordingly.   Margaret [name] will benefit from continued skilled PT to address ongoing ROM, flexibility, abnormal deficits to improve mobility and activity tolerance with decreased pain interference, decreased risk for falls, and reduced risk for osteoporotic fractures.   EVAL: Margaret [name] is a 73 y.o. female who was referred to physical therapy for evaluation and treatment for acute on chronic low back pain.  She has also recently been diagnosed with osteopenia.  Patient reports exacerbation of chronic LBP beginning after a fall resulting in L1 compression fracture in January of this year.  Pain is worse with prolonged sitting or standing.  Patient has deficits in lumbar ROM, proximal LE flexibility, core and B LE strength, abnormal posture secondary to severe scoliosis, and TTP with abnormal muscle tension in B lumbar paraspinals, glutes and piriformis which are interfering with ADLs and are impacting quality of life.  On Modified Oswestry patient scored 21/50 demonstrating 42% or severe disability.  Margaret [name] will benefit from skilled PT to address above deficits to improve mobility and activity tolerance with decreased pain interference.     OBJECTIVE IMPAIRMENTS: Abnormal gait, decreased activity tolerance, decreased mobility, difficulty  walking, decreased ROM, decreased strength, decreased safety awareness, increased fascial restrictions, impaired perceived functional ability, increased muscle spasms, impaired flexibility, improper body mechanics, postural dysfunction, and pain.   ACTIVITY LIMITATIONS: carrying, lifting, bending, sitting, standing, squatting, sleeping, transfers, locomotion level, and caring for others  PARTICIPATION LIMITATIONS: meal prep, cleaning, laundry, driving, and community activity  PERSONAL FACTORS: Age, Fitness, Past/current experiences, Time since onset of injury/illness/exacerbation, and 3+ comorbidities: L1 compression fracture s/p fall 05/31/23, osteopenia, lumbar facet arthritis,  cervical radiculopathy, B knee pain, vertigo, HTN, CVA/TIA, OSA, hiatal hernia with GERD, peripheral edema are also affecting patient's functional outcome.   REHAB POTENTIAL: Good  CLINICAL DECISION MAKING: Evolving/moderate complexity  EVALUATION COMPLEXITY: Moderate   GOALS: Goals reviewed with patient? Yes  SHORT TERM GOALS: Target date: 04/15/2024  Patient will be independent with initial HEP to improve outcomes and carryover.  Baseline: Initial HEP provided on eval Goal status: IN PROGRESS - 03/25/24 - HEP reviewed  2.  Patient will report 25% improvement in low back pain to improve QOL. Baseline: 5-6/10 on eval, up to 8-9/10 at worst (getting up in the morning) Goal status: INITIAL  LONG TERM GOALS: Target date: 05/13/2024  Patient will be independent with ongoing/advanced HEP for self-management at home.  Baseline:  Goal status: INITIAL  2.  Patient will report 50-75% improvement in low back pain to improve QOL.  Baseline: 5-6/10 on eval, up to 8-9/10 at worst (getting up in the morning) Goal status: INITIAL  3.  Patient to demonstrate ability to achieve and maintain good spinal alignment/posturing and body mechanics needed for daily activities to reduce stressors placed on low back musculature and reduce risk for osteoporotic fracture. Baseline:  Goal status: IN PROGRESS - 03/25/24 - education provided on proper posture and body mechanics as well as do's and don'ts to reduce risk factors for osteoporotic fractures  4.  Patient will demonstrate functional pain free lumbar ROM to perform ADLs.   Baseline: Refer to above lumbar ROM table Goal status: INITIAL  5.  Patient will demonstrate improved B LE strength to >/= 4+/5 for improved stability and ease of mobility. Baseline: Refer to above LE MMT table Goal status: INITIAL  6. Patient will report </= 30% on Modified Oswestry (MCID = 12%) to demonstrate improved functional ability with decreased pain  interference. Baseline: 21 / 50 = 42.0 % Goal status: INITIAL  7.  Patient will demonstrate improved functional LE strength by completing 5xSTS and <15 seconds without need for UE assist on thighs. Baseline: 19.62 seconds with B UE assist pushing from thighs Goal status: INITIAL   PLAN:  PT FREQUENCY: 2x/week  PT DURATION: 8 weeks  PLANNED INTERVENTIONS: [id number]- PT Re-evaluation, 97750- Physical Performance Testing, 97110-Therapeutic exercises, 97530- Therapeutic activity, 97112- Neuromuscular re-education, 97535- Self Care, [id number]- Manual therapy, [phone]- Gait training, [phone]- Aquatic Therapy, [phone]- Electrical stimulation (unattended), [phone]- Ultrasound, [id number]- Traction (mechanical), [id number]- Ionotophoresis 4mg /ml Dexamethasone, 79439 (1-2 muscles), [id number] (3+ muscles)- Dry Needling, Patient/Family education, Balance training, Stair training, Taping, Joint mobilization, Spinal mobilization, Cryotherapy, and Moist heat  PLAN FOR NEXT SESSION: gently progress lumbopelvic flexibility/stretching/ROM and progress core/lumbopelvic stabilization and strengthening, reviewing and updating HEP as appropriate; MT +/- TPDN and modalities as needed for pain management; review education on posture and body mechanics to reduce lumbar strain and risk for osteoporotic fractures PRN   Margaret [name] CHRISTELLA Hidden, PT 03/25/2024, 2:19 PM

## 2024-04-03 ENCOUNTER — Encounter: Payer: Self-pay | Admitting: Physical Therapy

## 2024-04-03 ENCOUNTER — Ambulatory Visit: Payer: Medicare (Managed Care) | Attending: Student | Admitting: Physical Therapy

## 2024-04-03 DIAGNOSIS — M5459 Other low back pain: Secondary | ICD-10-CM | POA: Diagnosis not present

## 2024-04-03 DIAGNOSIS — R262 Difficulty in walking, not elsewhere classified: Secondary | ICD-10-CM | POA: Diagnosis not present

## 2024-04-03 DIAGNOSIS — R293 Abnormal posture: Secondary | ICD-10-CM | POA: Diagnosis not present

## 2024-04-03 DIAGNOSIS — M62830 Muscle spasm of back: Secondary | ICD-10-CM | POA: Insufficient documentation

## 2024-04-03 NOTE — Therapy (Signed)
 OUTPATIENT PHYSICAL THERAPY TREATMENT   Patient Name: Margaret [name] MRN: [id number] DOB:1950/06/25, 73 y.o., female Today's Date: 04/03/2024  END OF SESSION:  PT End of Session - 04/03/24 1618     Visit Number 3    Date for Recertification  05/13/24    Authorization Type [employer]    Progress Note Due on Visit 10    PT Start Time 1618    PT Stop Time 1658    PT Time Calculation (min) 40 min    Activity Tolerance Patient tolerated treatment well    Behavior During Therapy [facility] for tasks assessed/performed           Past Medical History:  Diagnosis Date   Allergic state 03/25/2012   Allergy    Aortic calcification 08/27/2014   Atherosclerosis of aorta 04/12/2014   Back pain    Constipation 01/23/2017   COVID-19 02/21/2021   GERD (gastroesophageal reflux disease)    Hiatal hernia with gastroesophageal reflux 04/27/2010   Qualifier: Diagnosis of  By: Anice CMA, Darlene  Failed Omeprazole  and Protonix     Hx of adenomatous polyp of colon 01/23/2017   Colonoscopy 01/16/2017 with 2 polyps one was adenomatous. recommeded follow up in 5 years.performed by Dr Shila   Hyperlipidemia    Hypertension    Joint pain    Osteopenia 08/31/2012   Peripheral edema 12/13/2014   Sleep apnea    Unspecified sinusitis (chronic) 03/30/2013   Past Surgical History:  Procedure Laterality Date   24 HOUR PH STUDY N/A 12/15/2015   Procedure: 24 HOUR PH STUDY;  Surgeon: Gustav LULLA Shila, MD;  Location: [facility];  Service: Endoscopy;  Laterality: N/A;   DILATION AND CURETTAGE OF UTERUS     ESOPHAGEAL MANOMETRY N/A 12/15/2015   Procedure: ESOPHAGEAL MANOMETRY (EM);  Surgeon: Gustav LULLA Shila, MD;  Location: [facility];  Service: Endoscopy;  Laterality: N/A;   Patient Active Problem List   Diagnosis Date Noted   Obesity (BMI 30-39.9) 02/12/2024   Subconjunctival hemorrhage of left eye 08/07/2022   Pain in both knees 04/25/2022   Heme positive stool 04/25/2022   Abdominal  pain 01/13/2022   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   LAD (lymphadenopathy), anterior cervical 07/10/2021   Vertigo 05/17/2021   Facet arthritis of lumbar region 04/07/2021   UTI (urinary tract infection) 08/01/2020   Muscle cramps 06/24/2020   Insomnia 05/29/2019   Educated about COVID-19 virus infection 09/20/2018   Headache 04/30/2018   Other fatigue 04/03/2018   Dyspnea 04/03/2018   Obstructive sleep apnea syndrome 04/03/2018   Vitamin D  deficiency 04/03/2018   Obesity 01/22/2018   Neck pain 12/16/2017   Fatty infiltration of liver 12/16/2017   Hx of colonic polyps 01/23/2017   Constipation 01/23/2017   Cervical radiculopathy    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   TIA (transient ischemic attack) 09/06/2016   Right hand weakness    Peripheral edema 12/13/2014   Aortic atherosclerosis (HCC) 04/12/2014   Skin lesion of back 03/31/2014   Joint stiffness 03/31/2014   Essential hypertension 02/08/2014   Atypical chest pain 01/21/2014   Hyperglycemia 07/27/2013   Preventative health care 03/30/2013   Left shoulder pain 11/14/2012   Osteopenia 08/31/2012   Back pain 03/25/2012   Patellofemoral pain syndrome of right knee 03/25/2012   Allergy 03/25/2012   Helicobacter pylori infection 06/08/2010   Hiatal hernia with gastroesophageal reflux 04/27/2010   Hyperlipidemia, mixed 04/07/2010    PCP: Domenica Harlene LABOR, MD   REFERRING PROVIDER: Wheeler Harlene CROME, NP  REFERRING DIAG: M54.9 (ICD-10-CM) - Back pain, unspecified back location, unspecified back pain laterality, unspecified chronicity   THERAPY DIAG:  Other low back pain  Abnormal posture  Muscle spasm of back  Difficulty in walking, not elsewhere classified  RATIONALE FOR EVALUATION AND TREATMENT: Rehabilitation  ONSET DATE: Chronic over several years, exacerbated since fall with L1 compression fracture in January 2025  NEXT MD VISIT: 04/11/24 - Harlene Horton, MD 05/06/24 - Katelyn Moffo, PA & Donnajean Eddy,  MD   SUBJECTIVE:                                                                                                                                                                                                         SUBJECTIVE STATEMENT: Pt reports some days are good, some not so good.  More sore today after busier day at home.  EVAL: Pt reports a fall in January resulting in a compression fracture in her back which has exacerbated her chronic LBP.  Recent bone density study revealed degeneration of her bones with some osteopenia.  Back pain is daily but degree of pain changes with activity.  Back pain radiates around into pelvis but no numbness and tingling or change in bowel and bladder control other than increased awareness of urgency related to bladder.  Patient denies LE radicular pain.  PAIN: Are you having pain? Yes: NPRS scale: 6-7/10  Pain location: Midline and B low back wrapping around into pelvis  Pain description: shooting, sore, tightness  Aggravating factors: prolonged sitting or standing  Relieving factors: massage, hot pack   PERTINENT HISTORY:  L1 compression fracture s/p fall 05/31/23, osteopenia, lumbar facet arthritis, cervical radiculopathy, B knee pain, vertigo, HTN, CVA/TIA, OSA, hiatal hernia with GERD, peripheral edema  PRECAUTIONS: Back and Other: osteopenia  RED FLAGS: Bowel or bladder incontinence: Yes: increased urgency & bloating but no loss of control   WEIGHT BEARING RESTRICTIONS: No  FALLS:  Has patient fallen in last 6 months? No  LIVING ENVIRONMENT: Lives with: lives with their family and lives with their spouse Lives in: House Stairs: Yes: Internal: 15-17 steps; on right going up and External: 1 steps; none Has following equipment at home: None  OCCUPATION: Retired  PLOF: Independent and Leisure: traveling, some stretching   PATIENT GOALS: To be able to help manage my back pain on my own, at home and when traveling.   OBJECTIVE:  (objective measures completed at initial evaluation unless otherwise dated)  DIAGNOSTIC FINDINGS:  09/28/23 - DUAL X-RAY ABSORPTIOMETRY (DXA) FOR BONE MINERAL DENSITY  Osteopenia based on BMD.  Fracture risk is increased. Increased risk is based on low BMD, and  history of vertebral fracture.   RECOMMENDATIONS:  1. All patients should optimize calcium  and vitamin D  intake.   2. Consider FDA-approved medical therapies in postmenopausal women  and men aged 37 years and older, based on the following:   - A hip or vertebral (clinical or morphometric) fracture   - T-score less than or equal to -2.5 and secondary causes have been  excluded.   - Low bone mass (T-score between -1.0 and -2.5) and a 10-year  probability of a hip fracture greater than or equal to 3% or a  10-year probability of a major osteoporosis-related fracture greater  than or equal to 20% based on the [place] -adapted WHO algorithm.   - Clinician judgment and/or patient preferences may indicate  treatment for people with 10-year fracture probabilities above or  below these levels   3. Patients with diagnosis of osteoporosis or at high risk for  fracture should have regular bone mineral density tests. For  patients eligible for Medicare, routine testing is allowed once  every 2 years. The testing frequency can be increased to one year  for patients who have rapidly progressing disease, those who are  receiving or discontinuing medical therapy to restore bone mass, or  have additional risk factors.   05/31/23 - CT LUMBAR SPINE WITHOUT CONTRAST  FINDINGS:  Segmentation: 5 lumbar type vertebrae.   Alignment: Chronic trace anterolisthesis of L4 on L5.   Vertebrae: Acute L1 superior endplate compression fracture with  5-10% vertebral body height loss. No suspicious bone lesion.   Paraspinal and other soft tissues: Hepatic steatosis. Abdominal  aortic atherosclerosis without aneurysm. Proximally 4 cm left-sided  uterine  fibroid, similar to the prior CT.   Disc levels: Mild lumbar spondylosis and advanced multilevel facet  arthrosis. No evidence of high-grade stenosis.   IMPRESSION:  1. Acute L1 compression fracture.  2.  Aortic Atherosclerosis (ICD10-I70.0).   04/17/23 - LUMBAR SPINE - COMPLETE 4+ VIEW FINDINGS: Osteopenia. There are five non-rib bearing lumbar-type vertebral bodies. There is levocurvature of the lower lumbar spine and dextrocurvature of the thoracolumbar junction. There is trace retrolisthesis of L5-S1. There is no evidence for acute fracture or subluxation. There is mild intervertebral disc space height loss at L5-S1. Posterior disc osteophyte complex at L1-2 and T12-L1. Severe lower lumbar facet arthropathy, LEFT greater than RIGHT. Atherosclerotic calcifications of the aorta.   IMPRESSION: Severe lower lumbar facet arthropathy.  PATIENT SURVEYS:  Modified Oswestry:  MODIFIED OSWESTRY DISABILITY SCALE  Date:  03/18/2024   Pain intensity 4 =  Pain medication provides me with little relief from pain.  2. Personal care (washing, dressing, etc.) 1 =  I can take care of myself normally, but it increases my pain.  3. Lifting 4 = I can lift only very light weights  4. Walking 3 =  Pain prevents me from walking more than  mile.  5. Sitting 0 =  I can sit in any chair as long as I like.  6. Standing 2 =  Pain prevents me from standing more than 1 hour  7. Sleeping 1 = I can sleep well only by using pain medication.  8. Social Life 2 = Pain prevents me from participating in more energetic activities (eg. sports, dancing).  9. Traveling 2 =  My pain restricts my travel over 2 hours.  10. Employment/ Homemaking 3 = Pain prevents me from doing anything but light duties.  Total 21/50  %  Disability 42.0 % - severe   Interpretation of scores: Score Category Description  0-20% Minimal Disability The patient can cope with most living activities. Usually no treatment is indicated apart  from advice on lifting, sitting and exercise  21-40% Moderate Disability The patient experiences more pain and difficulty with sitting, lifting and standing. Travel and social life are more difficult and they may be disabled from work. Personal care, sexual activity and sleeping are not grossly affected, and the patient can usually be managed by conservative means  41-60% Severe Disability Pain remains the main problem in this group, but activities of daily living are affected. These patients require a detailed investigation  61-80% Crippled Back pain impinges on all aspects of the patient's life. Positive intervention is required  81-100% Bed-bound These patients are either bed-bound or exaggerating their symptoms  Bluford FORBES Zoe DELENA Karon DELENA, et al. Surgery versus conservative management of stable thoracolumbar fracture: the PRESTO feasibility RCT. [place] ([place]): [employer]; 2021 Nov. [facility] Technology Assessment, No. 25.62.) Appendix 3, Oswestry Disability Index category descriptors. Available from: Findjewelers.cz  Minimally Clinically Important Difference (MCID) = 12.8%  SCREENING FOR RED FLAGS: Bowel or bladder incontinence: Yes: Awareness of increased urgency & bloating but no loss of control Spinal tumors: No Cauda equina syndrome: No Compression fracture: No Abdominal aneurysm: No  COGNITION:  Overall cognitive status: Within functional limits for tasks assessed    SENSATION: WFL  POSTURE:  rounded shoulders, forward head, increased lumbar lordosis, increased thoracic kyphosis, weight shift right, and pronounced scoliosis - levocurvature of the lower lumbar spine and dextrocurvature of the thoracolumbar junction  PALPATION: Increased muscle tension and TTP in B lumbar paraspinals, glutes and piriformis  LUMBAR ROM:   Active  Eval  Flexion Hands to distal shins  Extension 75% limited  Right lateral flexion WFL - Hands to distal  shin  Left lateral flexion WFL - Hands to distal shin  Right rotation 40% limited  Left rotation 40% limited  (Blank rows = not tested)  MUSCLE LENGTH: Hamstrings: WFL ITB: mild/mod tight B Piriformis: mild/mod tight B Hip flexors: mod tight B Quads: mild/mod tight B Heelcord:   LOWER EXTREMITY ROM:    Grossly WFL with restrictions as indicated above due to muscle tightness  LOWER EXTREMITY MMT:    MMT Right eval Left eval  Hip flexion 4+ 4  Hip extension 4- 4  Hip abduction 3+ 3+  Hip adduction 4- 4-  Hip internal rotation 4+ 4+  Hip external rotation 4- 4-  Knee flexion 4 4  Knee extension 4+ 4+  Ankle dorsiflexion 4 4  Ankle plantarflexion 4- 4-  Ankle inversion    Ankle eversion     (Blank rows = not tested)  FUNCTIONAL TESTS:  5 times sit to stand: 19.62 sec with hands on thighs  GAIT: Distance walked: Clinic distances Assistive device utilized: None Level of assistance: Complete Independence Gait pattern: lateral lean- Right and trunk flexed   TODAY'S TREATMENT:   04/03/2024  THERAPEUTIC EXERCISE: To improve strength, endurance, ROM, and flexibility.  Demonstration, verbal and tactile cues throughout for technique.  Rec Bike - L1 x 6 min Seated lunge position hip flexor stretch 2 x 30 bil Seated QL stretch 2 x 30 bil Seated lumbar flexion stretch from sitting in chair with hands sliding across mat table x 30 Seated 3-way lumbar flexion stretch with hands resting on green Pball  NEUROMUSCULAR RE-EDUCATION: To improve coordination, kinesthesia, and posture.  Seated hip flexion march +  contralateral UE raise x 10 Standing bird dog leaning on back of chair x 10  THERAPEUTIC ACTIVITIES: To improve functional performance.  Demonstration, verbal and tactile cues throughout for technique.  Standing alt hip ABD x 10 bil - cues to avoid hip ER and maintain upright torso Standing heel raises 2 x 10 - cues for quad, glute and TrA engagement  Sit to/from stand  x 10 - cues for positioning at edge of chair with emphasis on forward weight shift and upright torso to reduce thoracolumbar strain   03/25/2024  THERAPEUTIC EXERCISE: To improve strength, endurance, ROM, and flexibility.  Demonstration, verbal and tactile cues throughout for technique.  NuStep - L3 x 6 min (UE/LE) LTR 10 x 5 Hooklying figure-4 piriformis stretch 3 x 30 bil Mod thomas hip flexor stretch with strap 3 x 30 bil  Hooklying TrA isometric/PPT 10 x 5 Hooklying TrA/PPT + hip ADD ball squeeze isometric 10 x 5 Hooklying TrA/PPT + hip ABD/ER clam into looped RTB at distal thighs 10 x 5  SELF CARE: Provided education to increase independence with ADLs and to facilitate performance of basic household cleaning/chores. Provided education in proper posture and body mechanics for typical daily positioning and household chores to minimize strain on low back and reduce risk for osteoporotic fractures.   03/18/2024 - Eval SELF CARE:  Reviewed eval findings and role of PT in addressing identified deficits as well as instruction in initial HEP (see below).    PATIENT EDUCATION:  Education details: HEP review, HEP update, and postural awareness  Person educated: Patient Education method: Explanation, Demonstration, Verbal cues, and Handouts Education comprehension: verbalized understanding, returned demonstration, verbal cues required, and needs further education  HOME EXERCISE PROGRAM: Access Code: [email or web address] URL: [email or web address] Date: 04/03/2024 Prepared by: Elijah Hidden  Exercises - Supine Lower Trunk Rotation  - 2 x daily - 7 x weekly - 5 reps - 10 sec hold - Supine Figure 4 Piriformis Stretch  - 2 x daily - 7 x weekly - 3 reps - 30 sec hold - Seated Hip Flexor Stretch  - 2 x daily - 7 x weekly - 3 reps - 30 sec hold - Supine Posterior Pelvic Tilt  - 1 x daily - 7 x weekly - 2 sets - 10 reps - 5 sec hold - Supine Hip Adduction Isometric with Ball  - 1 x  daily - 7 x weekly - 2 sets - 10 reps - 5 sec hold - Hooklying Clamshell with Resistance  - 1 x daily - 7 x weekly - 2 sets - 10 reps - 3-5 sec hold - Seated 3 Way Exercise Ball Roll Out Stretch  - 2 x daily - 7 x weekly - 3 reps - 30 sec hold - Sit to Stand  - 1 x daily - 7 x weekly - 2 sets - 10 reps - 3 sec hold  Patient Education - Posture and Body Mechanics - Do it right & prevent fractures - Osteoporosis education   ASSESSMENT:  CLINICAL IMPRESSION: Margaret [name] reports increased pec pain today after a busy day around her house yesterday but admits to poor follow-through with recommended posture and body mechanics for household chores.  She has tried the HEP but is not doing it regularly - discussed need for regular compliance with HEP to achieve benefit.  She notes difficulty performing mod Thomas hip flexor stretch due to lack of strap therefore provided alternative version in sitting.  Continued to address limited thoracolumbar flexibility and abnormal  muscle tension with seated QL and thoracolumbar flexion stretches as well as seated and standing core and LE strengthening emphasizing postural awareness, with patient reporting good tolerance for all exercises.  Margaret [name] will benefit from continued skilled PT to address ongoing ROM, flexibility, abnormal deficits to improve mobility and activity tolerance with decreased pain interference, decreased risk for falls, and reduced risk for osteoporotic fractures.   EVAL: Margaret [name] is a 73 y.o. female who was referred to physical therapy for evaluation and treatment for acute on chronic low back pain.  She has also recently been diagnosed with osteopenia.  Patient reports exacerbation of chronic LBP beginning after a fall resulting in L1 compression fracture in January of this year.  Pain is worse with prolonged sitting or standing.  Patient has deficits in lumbar ROM, proximal LE flexibility, core and B LE strength, abnormal posture secondary  to severe scoliosis, and TTP with abnormal muscle tension in B lumbar paraspinals, glutes and piriformis which are interfering with ADLs and are impacting quality of life.  On Modified Oswestry patient scored 21/50 demonstrating 42% or severe disability.  Margaret [name] will benefit from skilled PT to address above deficits to improve mobility and activity tolerance with decreased pain interference.     OBJECTIVE IMPAIRMENTS: Abnormal gait, decreased activity tolerance, decreased mobility, difficulty walking, decreased ROM, decreased strength, decreased safety awareness, increased fascial restrictions, impaired perceived functional ability, increased muscle spasms, impaired flexibility, improper body mechanics, postural dysfunction, and pain.   ACTIVITY LIMITATIONS: carrying, lifting, bending, sitting, standing, squatting, sleeping, transfers, locomotion level, and caring for others  PARTICIPATION LIMITATIONS: meal prep, cleaning, laundry, driving, and community activity  PERSONAL FACTORS: Age, Fitness, Past/current experiences, Time since onset of injury/illness/exacerbation, and 3+ comorbidities: L1 compression fracture s/p fall 05/31/23, osteopenia, lumbar facet arthritis, cervical radiculopathy, B knee pain, vertigo, HTN, CVA/TIA, OSA, hiatal hernia with GERD, peripheral edema are also affecting patient's functional outcome.   REHAB POTENTIAL: Good  CLINICAL DECISION MAKING: Evolving/moderate complexity  EVALUATION COMPLEXITY: Moderate   GOALS: Goals reviewed with patient? Yes  SHORT TERM GOALS: Target date: 04/15/2024  Patient will be independent with initial HEP to improve outcomes and carryover.  Baseline: Initial HEP provided on eval 03/25/24 - HEP reviewed Goal status: IN PROGRESS - 111/6/25 - HEP reviewed and updated with modified version of hip flexor stretch as well as addition of thoracolumbar flexion stretching and proper sit to stand mechanics  2.  Patient will report 25%  improvement in low back pain to improve QOL. Baseline: 5-6/10 on eval, up to 8-9/10 at worst (getting up in the morning) Goal status: INITIAL  LONG TERM GOALS: Target date: 05/13/2024  Patient will be independent with ongoing/advanced HEP for self-management at home.  Baseline:  Goal status: INITIAL  2.  Patient will report 50-75% improvement in low back pain to improve QOL.  Baseline: 5-6/10 on eval, up to 8-9/10 at worst (getting up in the morning) Goal status: INITIAL  3.  Patient to demonstrate ability to achieve and maintain good spinal alignment/posturing and body mechanics needed for daily activities to reduce stressors placed on low back musculature and reduce risk for osteoporotic fracture. Baseline:  Goal status: IN PROGRESS - 03/25/24 - education provided on proper posture and body mechanics as well as do's and don'ts to reduce risk factors for osteoporotic fractures  4.  Patient will demonstrate functional pain free lumbar ROM to perform ADLs.   Baseline: Refer to above lumbar ROM table Goal status: INITIAL  5.  Patient will  demonstrate improved B LE strength to >/= 4+/5 for improved stability and ease of mobility. Baseline: Refer to above LE MMT table Goal status: INITIAL  6. Patient will report </= 30% on Modified Oswestry (MCID = 12%) to demonstrate improved functional ability with decreased pain interference. Baseline: 21 / 50 = 42.0 % Goal status: INITIAL  7.  Patient will demonstrate improved functional LE strength by completing 5xSTS and <15 seconds without need for UE assist on thighs. Baseline: 19.62 seconds with B UE assist pushing from thighs Goal status: INITIAL   PLAN:  PT FREQUENCY: 2x/week  PT DURATION: 8 weeks  PLANNED INTERVENTIONS: 97164- PT Re-evaluation, 97750- Physical Performance Testing, 97110-Therapeutic exercises, 97530- Therapeutic activity, 97112- Neuromuscular re-education, 97535- Self Care, [id number]- Manual therapy, [phone]- Gait  training, [phone]- Aquatic Therapy, [phone]- Electrical stimulation (unattended), [phone]- Ultrasound, [id number]- Traction (mechanical), [id number]- Ionotophoresis 4mg /ml Dexamethasone, 79439 (1-2 muscles), [id number] (3+ muscles)- Dry Needling, Patient/Family education, Balance training, Stair training, Taping, Joint mobilization, Spinal mobilization, Cryotherapy, and Moist heat  PLAN FOR NEXT SESSION: gently progress lumbopelvic flexibility/stretching/ROM and progress core/lumbopelvic stabilization and strengthening, reviewing and updating HEP as appropriate; MT +/- TPDN and modalities as needed for pain management; review education on posture and body mechanics to reduce lumbar strain and risk for osteoporotic fractures PRN   Elijah CHRISTELLA Hidden, PT 04/03/2024, 5:09 PM

## 2024-04-07 ENCOUNTER — Ambulatory Visit: Payer: Medicare (Managed Care) | Admitting: Physical Therapy

## 2024-04-13 NOTE — Progress Notes (Unsigned)
 Subjective:    Patient ID: Margaret [name], female    DOB: 07/20/1950, 73 y.o.   MRN: [id number]  No chief complaint on file.   HPI Discussed the use of AI scribe software for clinical note transcription with the patient, who gave verbal consent to proceed.  History of Present Illness Margaret [name] is a 73 year old female with osteoporosis who presents for a physical exam and management of lower back pain due to a hairline fracture.  She has been experiencing lower back pain on the left side since a fall in January, nearly a year ago, which resulted in a hairline fracture that did not require surgery. She was treated with medication and a compression brace for three months. Her pain is severe in the morning, and she finds it difficult to stand for long periods, such as when cooking, and needs to rest frequently. She finds it difficult to stand for long periods, such as when cooking, and needs to rest frequently.  She manages her pain with Tylenol , taking one in the morning and one at night, although she sometimes skips doses. She has undergone physical therapy four times, which has helped but not completely alleviated her symptoms.  She uses a CPAP machine for sleep apnea, which has improved her sleep quality significantly, allowing her to sleep for 5-7 hours. She travels with her CPAP machine despite the inconvenience and cost of a travel-sized device not covered by insurance.  Family history includes strokes in her sister and mother. Her son, aged 60, also uses a CPAP machine for sleep apnea. She has two sons, aged 37 and 76, and two grandchildren, a four-year-old and a five-month-old, who live in [place].    Past Medical History:  Diagnosis Date   Allergic state 03/25/2012   Allergy    Aortic calcification 08/27/2014   Atherosclerosis of aorta 04/12/2014   Back pain    Constipation 01/23/2017   COVID-19 02/21/2021   GERD (gastroesophageal reflux disease)    Hiatal  hernia with gastroesophageal reflux 04/27/2010   Qualifier: Diagnosis of  By: Anice CMA, Darlene  Failed Omeprazole  and Protonix     Hx of adenomatous polyp of colon 01/23/2017   Colonoscopy 01/16/2017 with 2 polyps one was adenomatous. recommeded follow up in 5 years.performed by Dr Shila   Hyperlipidemia    Hypertension    Joint pain    Osteopenia 08/31/2012   Peripheral edema 12/13/2014   Sleep apnea    Unspecified sinusitis (chronic) 03/30/2013    Past Surgical History:  Procedure Laterality Date   24 HOUR PH STUDY N/A 12/15/2015   Procedure: 24 HOUR PH STUDY;  Surgeon: Gustav LULLA Shila, MD;  Location: [facility];  Service: Endoscopy;  Laterality: N/A;   DILATION AND CURETTAGE OF UTERUS     ESOPHAGEAL MANOMETRY N/A 12/15/2015   Procedure: ESOPHAGEAL MANOMETRY (EM);  Surgeon: Gustav LULLA Shila, MD;  Location: [facility];  Service: Endoscopy;  Laterality: N/A;    Family History  Problem Relation Age of Onset   Stroke Mother    High blood pressure Mother    High Cholesterol Mother    Lung cancer Father    Stroke Sister        84   Thyroid  disease Sister    Diabetes Sister    Hypertension Sister    Hyperlipidemia Sister    Hypertension Sister    Sleep apnea Son    Obesity Son    Colon cancer Neg Hx    Esophageal cancer  Neg Hx    Stomach cancer Neg Hx     Social History   Socioeconomic History   Marital status: Married    Spouse name: Victory   Number of children: 2   Years of education: 16   Highest education level: Not on file  Occupational History   Occupation: Airline Pilot    Comment: retired  Tobacco Use   Smoking status: Never   Smokeless tobacco: Never  Vaping Use   Vaping status: Never Used  Substance and Sexual Activity   Alcohol use: Yes    Alcohol/week: 0.0 standard drinks of alcohol    Comment: Rarely   Drug use: No   Sexual activity: Not Currently    Comment: no dietary restrictions, lives with husand, works at [id number]  Other Topics Concern    Not on file  Social History Narrative   Lives with husband   Caffeine- tea, 2 cups daily   Social Drivers of Health   Financial Resource Strain: Low Risk  (02/07/2024)   Overall Financial Resource Strain (CARDIA)    Difficulty of Paying Living Expenses: Not very hard  Food Insecurity: No Food Insecurity (02/07/2024)   Hunger Vital Sign    Worried About Running Out of Food in the Last Year: Never true    Ran Out of Food in the Last Year: Never true  Transportation Needs: No Transportation Needs (02/07/2024)   PRAPARE - Administrator, Civil Service (Medical): No    Lack of Transportation (Non-Medical): No  Physical Activity: Insufficiently Active (02/07/2024)   Exercise Vital Sign    Days of Exercise per Week: 3 days    Minutes of Exercise per Session: 10 min  Stress: No Stress Concern Present (02/07/2024)   [employer] of Occupational Health - Occupational Stress Questionnaire    Feeling of Stress: Not at all  Social Connections: Socially Integrated (02/07/2024)   Social Connection and Isolation Panel    Frequency of Communication with Friends and Family: More than three times a week    Frequency of Social Gatherings with Friends and Family: Once a week    Attends Religious Services: More than 4 times per year    Active Member of [employer] or Organizations: Yes    Attends Engineer, Structural: More than 4 times per year    Marital Status: Married  Catering Manager Violence: Not At Risk (02/07/2024)   Humiliation, Afraid, Rape, and Kick questionnaire    Fear of Current or Ex-Partner: No    Emotionally Abused: No    Physically Abused: No    Sexually Abused: No    Outpatient Medications Prior to Visit  Medication Sig Dispense Refill   acetaminophen  (TYLENOL ) 500 MG tablet Take 1 tablet (500 mg total) by mouth in the morning and at bedtime. (Patient taking differently: Take 500 mg by mouth every 4 (four) hours as needed.) 30 tablet 0   amLODipine  (NORVASC ) 5  MG tablet TAKE 1 TABLET(5 MG) BY MOUTH DAILY 90 tablet 1   aspirin  81 MG chewable tablet Chew by mouth daily.     Calcium  Carbonate (CALCIUM  500 PO) Take 1 tablet by mouth daily.     carvedilol  (COREG ) 6.25 MG tablet TAKE 1 TABLET(6.25 MG) BY MOUTH TWICE DAILY WITH A MEAL 180 tablet 1   fish oil-omega-3 fatty acids 1000 MG capsule Take 2 g by mouth daily.     fluticasone  (FLONASE ) 50 MCG/ACT nasal spray Place 2 sprays into both nostrils in the morning and at bedtime. (  Patient taking differently: Place 2 sprays into both nostrils as needed.) 16 g 6   ibuprofen  (ADVIL ) 800 MG tablet Take 800 mg by mouth every 8 (eight) hours as needed. (Patient not taking: Reported on 03/18/2024)     lidocaine  (LIDODERM ) 5 % Place 1 patch onto the skin every 12 (twelve) hours as needed. Remove & Discard patch within 12 hours or as directed by MD (Patient not taking: Reported on 03/18/2024) 30 patch 1   losartan  (COZAAR ) 25 MG tablet Take 1 tablet (25 mg total) by mouth daily. 90 tablet 1   meclizine  (ANTIVERT ) 25 MG tablet Take 1 tablet (25 mg total) by mouth 3 (three) times daily. (Patient not taking: Reported on 03/18/2024) 30 tablet 1   meloxicam  (MOBIC ) 7.5 MG tablet Take 1-2 tablets (7.5-15 mg total) by mouth daily as needed for pain. (Patient not taking: Reported on 03/18/2024) 30 tablet 3   Multiple Vitamin (MULTIVITAMIN WITH MINERALS) TABS tablet Take 1 tablet by mouth daily.     omeprazole  (PRILOSEC) 40 MG capsule Take 1 capsule (40 mg total) by mouth daily. (Patient taking differently: Take 40 mg by mouth daily as needed.) 90 capsule 1   rosuvastatin  (CRESTOR ) 10 MG tablet Take 1 tablet (10 mg total) by mouth daily. 90 tablet 2   tiZANidine  (ZANAFLEX ) 2 MG tablet Take 0.5-2 tablets (1-4 mg total) by mouth every 8 (eight) hours as needed for muscle spasms. 30 tablet 0   VITAMIN D  PO Take 2,500 Units by mouth daily.     Zinc 50 MG TABS Take 1 tablet by mouth daily.     No facility-administered medications  prior to visit.    Allergies  Allergen Reactions   Lisinopril  Cough   Potassium-Containing Compounds Rash    Review of Systems  Constitutional:  Negative for chills, fever and malaise/fatigue.  HENT:  Negative for congestion and hearing loss.   Eyes:  Negative for discharge.  Respiratory:  Negative for cough, sputum production and shortness of breath.   Cardiovascular:  Negative for chest pain, palpitations and leg swelling.  Gastrointestinal:  Negative for abdominal pain, blood in stool, constipation, diarrhea, heartburn, nausea and vomiting.  Genitourinary:  Negative for dysuria, frequency, hematuria and urgency.  Musculoskeletal:  Positive for back pain. Negative for falls and myalgias.  Skin:  Negative for rash.  Neurological:  Negative for dizziness, sensory change, loss of consciousness, weakness and headaches.  Endo/Heme/Allergies:  Negative for environmental allergies. Does not bruise/bleed easily.  Psychiatric/Behavioral:  Negative for depression and suicidal ideas. The patient is not nervous/anxious and does not have insomnia.        Objective:    Physical Exam Constitutional:      General: She is not in acute distress.    Appearance: Normal appearance. She is not diaphoretic.  HENT:     Head: Normocephalic and atraumatic.     Right Ear: Tympanic membrane, ear canal and external ear normal.     Left Ear: Tympanic membrane, ear canal and external ear normal.     Nose: Nose normal.     Mouth/Throat:     Mouth: Mucous membranes are moist.     Pharynx: Oropharynx is clear. No oropharyngeal exudate.  Eyes:     General: No scleral icterus.       Right eye: No discharge.        Left eye: No discharge.     Conjunctiva/sclera: Conjunctivae normal.     Pupils: Pupils are equal, round, and reactive to light.  Neck:     Thyroid : No thyromegaly.  Cardiovascular:     Rate and Rhythm: Normal rate and regular rhythm.     Heart sounds: Normal heart sounds. No murmur  heard. Pulmonary:     Effort: Pulmonary effort is normal. No respiratory distress.     Breath sounds: Normal breath sounds. No wheezing or rales.  Abdominal:     General: Bowel sounds are normal. There is no distension.     Palpations: Abdomen is soft. There is no mass.     Tenderness: There is no abdominal tenderness.  Musculoskeletal:        General: No tenderness. Normal range of motion.     Cervical back: Normal range of motion and neck supple.  Lymphadenopathy:     Cervical: No cervical adenopathy.  Skin:    General: Skin is warm and dry.     Findings: No rash.  Neurological:     General: No focal deficit present.     Mental Status: She is alert and oriented to person, place, and time.     Cranial Nerves: No cranial nerve deficit.     Coordination: Coordination normal.     Deep Tendon Reflexes: Reflexes are normal and symmetric. Reflexes normal.  Psychiatric:        Mood and Affect: Mood normal.        Behavior: Behavior normal.        Thought Content: Thought content normal.        Judgment: Judgment normal.    There were no vitals taken for this visit. Wt Readings from Last 3 Encounters:  02/12/24 189 lb 12.8 oz (86.1 kg)  02/07/24 183 lb (83 kg)  10/11/23 188 lb 3.2 oz (85.4 kg)    Diabetic Foot Exam - Simple   No data filed    Lab Results  Component Value Date   WBC 4.9 10/15/2023   HGB 13.1 10/15/2023   HCT 40.3 10/15/2023   PLT 216.0 10/15/2023   GLUCOSE 103 (H) 02/13/2024   CHOL 160 02/13/2024   TRIG 105.0 02/13/2024   HDL 53.90 02/13/2024   LDLCALC 85 02/13/2024   ALT 31 02/13/2024   AST 28 02/13/2024   NA 138 02/13/2024   K 4.5 02/13/2024   CL 102 02/13/2024   CREATININE 0.62 02/13/2024   BUN 13 02/13/2024   CO2 27 02/13/2024   TSH 1.49 02/13/2024   HGBA1C 6.7 (H) 02/13/2024   MICROALBUR 3.06 (H) 05/16/2011    Lab Results  Component Value Date   TSH 1.49 02/13/2024   Lab Results  Component Value Date   WBC 4.9 10/15/2023   HGB 13.1  10/15/2023   HCT 40.3 10/15/2023   MCV 89.3 10/15/2023   PLT 216.0 10/15/2023   Lab Results  Component Value Date   NA 138 02/13/2024   K 4.5 02/13/2024   CO2 27 02/13/2024   GLUCOSE 103 (H) 02/13/2024   BUN 13 02/13/2024   CREATININE 0.62 02/13/2024   BILITOT 0.6 02/13/2024   ALKPHOS 74 02/13/2024   AST 28 02/13/2024   ALT 31 02/13/2024   PROT 7.1 02/13/2024   ALBUMIN 4.5 02/13/2024   CALCIUM  9.9 02/13/2024   ANIONGAP 8 04/25/2021   GFR 90.02 02/13/2024   Lab Results  Component Value Date   CHOL 160 02/13/2024   Lab Results  Component Value Date   HDL 53.90 02/13/2024   Lab Results  Component Value Date   LDLCALC 85 02/13/2024   Lab Results  Component  Value Date   TRIG 105.0 02/13/2024   Lab Results  Component Value Date   CHOLHDL 3 02/13/2024   Lab Results  Component Value Date   HGBA1C 6.7 (H) 02/13/2024       Assessment & Plan:  Essential hypertension Assessment & Plan: Well controlled, no changes to meds. Encouraged heart healthy diet such as the DASH diet and exercise as tolerated.    Fatty infiltration of liver Assessment & Plan: Encouraged heart healthy diet such as MIND or DASH diet, increase exercise, avoid trans fats, simple carbohydrates and processed foods, consider a krill or fish or flaxseed oil cap daily.     Hyperglycemia Assessment & Plan: hgba1c acceptable, minimize simple carbs. Increase exercise as tolerated.    Hyperlipidemia, mixed Assessment & Plan: Encourage heart healthy diet such as MIND or DASH diet, increase exercise, avoid trans fats, simple carbohydrates and processed foods, consider a krill or fish or flaxseed oil cap daily. Tolerating Rosuvastatin    Muscle cramps Assessment & Plan: Hydrate and monitor    Obesity (BMI 30-39.9) Assessment & Plan: Encouraged DASH or MIND diet, decrease po intake and increase exercise as tolerated. Needs 7-8 hours of sleep nightly. Avoid trans fats, eat small, frequent meals  every 4-5 hours with lean proteins, complex carbs and healthy fats. Minimize simple carbs, high fat foods and processed foods    Vitamin D  deficiency Assessment & Plan: Supplement and monitor   Preventative health care Assessment & Plan: Patient encouraged to maintain heart healthy diet, regular exercise, adequate sleep. Consider daily probiotics. Take medications as prescribed. Labs ordered and reviewed.  Dexa 2025 repeat in 2-5 years MGM was 02/2024 repeat in 1 year.  Last colonoscopy 2021 repeat in 2026.  Pap with OB/GYN. Dexa was May 2022 repeat in 3-5 years.      Assessment and Plan Assessment & Plan Adult Wellness Visit - Scheduled colonoscopy for next year. - Scheduled mammogram for next year. - Referred to OBGYN for Pap smear.  Age-related osteoporosis with delayed healing pathological L1 vertebral fracture Osteoporosis diagnosis updated due to L1 vertebral fracture. Bone density shows osteopenia, but fracture complicates to osteoporosis. Fracture healing is delayed, likely due to age-related factors. Discussed treatment options including Fosamax, calcium , and vitamin D  supplementation. Explained potential side effects of Fosamax, including upset stomach and rare cardiac complications. Discussed alternative treatments if complications arise. - Started Fosamax once weekly on an empty stomach. - Continue calcium  and vitamin D  supplementation. - Encouraged dietary calcium  intake through foods like cottage cheese or yogurt. - Will monitor for side effects of Fosamax and adjust treatment if necessary.  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 Pain management includes Tylenol , physical therapy, and muscle relaxers. Discussed the use of heat therapy and exercises to alleviate morning stiffness. Consideration of sports  medicine referral if current management is insufficient. Discussed potential use of muscle relaxers at bedtime to reduce morning soreness. - Continue Tylenol  500 mg twice daily, increase to 1000 mg if needed. - Prescribed muscle relaxer to be taken up to twice daily. - Encouraged use of heat therapy and exercises before getting out of bed. - Will consider referral to sports medicine if pain management is inadequate.  Obstructive sleep apnea on CPAP therapy Obstructive sleep apnea managed with CPAP therapy. Discussed the importance of consistent CPAP use, especially during travel. Insurance coverage issues for travel CPAP noted. -  Consider purchasing a travel CPAP device if feasible.  Recording duration: 35 minutes     Harlene Horton, MD

## 2024-04-13 NOTE — Assessment & Plan Note (Signed)
 Encourage heart healthy diet such as MIND or DASH diet, increase exercise, avoid trans fats, simple carbohydrates and processed foods, consider a krill or fish or flaxseed oil cap daily. Tolerating Rosuvastatin

## 2024-04-13 NOTE — Assessment & Plan Note (Signed)
 Supplement and monitor

## 2024-04-13 NOTE — Assessment & Plan Note (Signed)
 Patient encouraged to maintain heart healthy diet, regular exercise, adequate sleep. Consider daily probiotics. Take medications as prescribed. Labs ordered and reviewed.  Dexa 2025 repeat in 2-5 years MGM was 02/2024 repeat in 1 year.  Last colonoscopy 2021 repeat in 2026.  Pap with OB/GYN. Dexa was May 2022 repeat in 3-5 years.

## 2024-04-13 NOTE — Assessment & Plan Note (Signed)
 Encouraged heart healthy diet such as MIND or DASH diet, increase exercise, avoid trans fats, simple carbohydrates and processed foods, consider a krill or fish or flaxseed oil cap daily.

## 2024-04-13 NOTE — Assessment & Plan Note (Signed)
 Well controlled, no changes to meds. Encouraged heart healthy diet such as the DASH diet and exercise as tolerated.

## 2024-04-13 NOTE — Assessment & Plan Note (Signed)
 Hydrate and monitor

## 2024-04-13 NOTE — Assessment & Plan Note (Signed)
 Encouraged DASH or MIND diet, decrease po intake and increase exercise as tolerated. Needs 7-8 hours of sleep nightly. Avoid trans fats, eat small, frequent meals every 4-5 hours with lean proteins, complex carbs and healthy fats. Minimize simple carbs, high fat foods and processed foods

## 2024-04-13 NOTE — Assessment & Plan Note (Signed)
 hgba1c acceptable, minimize simple carbs. Increase exercise as tolerated.

## 2024-04-14 ENCOUNTER — Ambulatory Visit (INDEPENDENT_AMBULATORY_CARE_PROVIDER_SITE_OTHER): Payer: Medicare (Managed Care) | Admitting: Family Medicine

## 2024-04-14 ENCOUNTER — Ambulatory Visit: Payer: Medicare (Managed Care) | Admitting: Physical Therapy

## 2024-04-14 ENCOUNTER — Encounter: Payer: Self-pay | Admitting: Family Medicine

## 2024-04-14 ENCOUNTER — Encounter: Payer: Self-pay | Admitting: Physical Therapy

## 2024-04-14 VITALS — BP 130/68 | Temp 98.3°F | Resp 16 | Ht 62.0 in | Wt 188.4 lb

## 2024-04-14 DIAGNOSIS — Z124 Encounter for screening for malignant neoplasm of cervix: Secondary | ICD-10-CM | POA: Diagnosis not present

## 2024-04-14 DIAGNOSIS — S32019A Unspecified fracture of first lumbar vertebra, initial encounter for closed fracture: Secondary | ICD-10-CM | POA: Insufficient documentation

## 2024-04-14 DIAGNOSIS — E669 Obesity, unspecified: Secondary | ICD-10-CM

## 2024-04-14 DIAGNOSIS — M5459 Other low back pain: Secondary | ICD-10-CM

## 2024-04-14 DIAGNOSIS — R739 Hyperglycemia, unspecified: Secondary | ICD-10-CM

## 2024-04-14 DIAGNOSIS — S32019G Unspecified fracture of first lumbar vertebra, subsequent encounter for fracture with delayed healing: Secondary | ICD-10-CM | POA: Diagnosis not present

## 2024-04-14 DIAGNOSIS — K760 Fatty (change of) liver, not elsewhere classified: Secondary | ICD-10-CM | POA: Diagnosis not present

## 2024-04-14 DIAGNOSIS — M62830 Muscle spasm of back: Secondary | ICD-10-CM

## 2024-04-14 DIAGNOSIS — Z0000 Encounter for general adult medical examination without abnormal findings: Secondary | ICD-10-CM

## 2024-04-14 DIAGNOSIS — E559 Vitamin D deficiency, unspecified: Secondary | ICD-10-CM | POA: Diagnosis not present

## 2024-04-14 DIAGNOSIS — R262 Difficulty in walking, not elsewhere classified: Secondary | ICD-10-CM

## 2024-04-14 DIAGNOSIS — M8000XG Age-related osteoporosis with current pathological fracture, unspecified site, subsequent encounter for fracture with delayed healing: Secondary | ICD-10-CM

## 2024-04-14 DIAGNOSIS — R252 Cramp and spasm: Secondary | ICD-10-CM | POA: Diagnosis not present

## 2024-04-14 DIAGNOSIS — I10 Essential (primary) hypertension: Secondary | ICD-10-CM | POA: Diagnosis not present

## 2024-04-14 DIAGNOSIS — R293 Abnormal posture: Secondary | ICD-10-CM

## 2024-04-14 DIAGNOSIS — E782 Mixed hyperlipidemia: Secondary | ICD-10-CM

## 2024-04-14 DIAGNOSIS — M8000XA Age-related osteoporosis with current pathological fracture, unspecified site, initial encounter for fracture: Secondary | ICD-10-CM | POA: Insufficient documentation

## 2024-04-14 LAB — CBC WITH DIFFERENTIAL/PLATELET
Basophils Absolute: 0.1 K/uL (ref 0.0–0.1)
Basophils Relative: 1.7 % (ref 0.0–3.0)
Eosinophils Absolute: 0.3 K/uL (ref 0.0–0.7)
Eosinophils Relative: 5.2 % — ABNORMAL HIGH (ref 0.0–5.0)
HCT: 39.8 % (ref 36.0–46.0)
Hemoglobin: 13 g/dL (ref 12.0–15.0)
Lymphocytes Relative: 31.6 % (ref 12.0–46.0)
Lymphs Abs: 1.9 K/uL (ref 0.7–4.0)
MCHC: 32.7 g/dL (ref 30.0–36.0)
MCV: 88 fl (ref 78.0–100.0)
Monocytes Absolute: 0.6 K/uL (ref 0.1–1.0)
Monocytes Relative: 9.3 % (ref 3.0–12.0)
Neutro Abs: 3.1 K/uL (ref 1.4–7.7)
Neutrophils Relative %: 52.2 % (ref 43.0–77.0)
Platelets: 213 K/uL (ref 150.0–400.0)
RBC: 4.52 Mil/uL (ref 3.87–5.11)
RDW: 14.3 % (ref 11.5–15.5)
WBC: 6 K/uL (ref 4.0–10.5)

## 2024-04-14 MED ORDER — ALENDRONATE SODIUM 70 MG PO TABS: 70.0000 mg | ORAL_TABLET | ORAL | 11 refills | Status: AC

## 2024-04-14 MED ORDER — TIZANIDINE HCL 2 MG PO TABS: 1.0000 mg | ORAL_TABLET | Freq: Three times a day (TID) | ORAL | 2 refills | Status: AC | PRN

## 2024-04-14 NOTE — Therapy (Signed)
 OUTPATIENT PHYSICAL THERAPY TREATMENT   Patient Name: Margaret [name] MRN: [id number] DOB:02/12/1951, 73 y.o., female Today's Date: 04/14/2024  END OF SESSION:  PT End of Session - 04/14/24 1317     Visit Number 4    Date for Recertification  05/13/24    Authorization Type [employer]    Progress Note Due on Visit 10    PT Start Time 1317    PT Stop Time 1400    PT Time Calculation (min) 43 min    Activity Tolerance Patient tolerated treatment well    Behavior During Therapy [facility] for tasks assessed/performed            Past Medical History:  Diagnosis Date   Allergic state 03/25/2012   Allergy    Aortic calcification 08/27/2014   Atherosclerosis of aorta 04/12/2014   Back pain    Constipation 01/23/2017   COVID-19 02/21/2021   GERD (gastroesophageal reflux disease)    Hiatal hernia with gastroesophageal reflux 04/27/2010   Qualifier: Diagnosis of  By: Anice CMA, Darlene  Failed Omeprazole  and Protonix     Hx of adenomatous polyp of colon 01/23/2017   Colonoscopy 01/16/2017 with 2 polyps one was adenomatous. recommeded follow up in 5 years.performed by Dr Shila   Hyperlipidemia    Hypertension    Joint pain    Osteopenia 08/31/2012   Peripheral edema 12/13/2014   Sleep apnea    Unspecified sinusitis (chronic) 03/30/2013   Past Surgical History:  Procedure Laterality Date   24 HOUR PH STUDY N/A 12/15/2015   Procedure: 24 HOUR PH STUDY;  Surgeon: Gustav LULLA Shila, MD;  Location: [facility];  Service: Endoscopy;  Laterality: N/A;   DILATION AND CURETTAGE OF UTERUS     ESOPHAGEAL MANOMETRY N/A 12/15/2015   Procedure: ESOPHAGEAL MANOMETRY (EM);  Surgeon: Gustav LULLA Shila, MD;  Location: [facility];  Service: Endoscopy;  Laterality: N/A;   Patient Active Problem List   Diagnosis Date Noted   Age-related osteoporosis with current pathological fracture 04/14/2024   L1 vertebral fracture (HCC) 04/14/2024   Obesity (BMI 30-39.9) 02/12/2024   Pain in both  knees 04/25/2022   Heme positive stool 04/25/2022   Abdominal pain 01/13/2022   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   LAD (lymphadenopathy), anterior cervical 07/10/2021   Facet arthritis of lumbar region 04/07/2021   Muscle cramps 06/24/2020   Insomnia 05/29/2019   Educated about COVID-19 virus infection 09/20/2018   Headache 04/30/2018   Other fatigue 04/03/2018   Dyspnea 04/03/2018   Obstructive sleep apnea syndrome 04/03/2018   Vitamin D  deficiency 04/03/2018   Neck pain 12/16/2017   Fatty infiltration of liver 12/16/2017   Hx of colonic polyps 01/23/2017   Constipation 01/23/2017   Cervical radiculopathy    History of stroke 09/06/2016   TIA (transient ischemic attack) 09/06/2016   Right hand weakness    Peripheral edema 12/13/2014   Aortic atherosclerosis (HCC) 04/12/2014   Skin lesion of back 03/31/2014   Joint stiffness 03/31/2014   Essential hypertension 02/08/2014   Atypical chest pain 01/21/2014   Hyperglycemia 07/27/2013   Preventative health care 03/30/2013   Left shoulder pain 11/14/2012   Back pain 03/25/2012   Patellofemoral pain syndrome of right knee 03/25/2012   Allergy 03/25/2012   Helicobacter pylori infection 06/08/2010   Hiatal hernia with gastroesophageal reflux 04/27/2010   Hyperlipidemia, mixed 04/07/2010    PCP: Domenica Harlene LABOR, MD   REFERRING PROVIDER: Wheeler Harlene CROME, NP   REFERRING DIAG: M54.9 (ICD-10-CM) - Back pain, unspecified back location,  unspecified back pain laterality, unspecified chronicity   THERAPY DIAG:  Other low back pain  Abnormal posture  Muscle spasm of back  Difficulty in walking, not elsewhere classified  RATIONALE FOR EVALUATION AND TREATMENT: Rehabilitation  ONSET DATE: Chronic over several years, exacerbated since fall with L1 compression fracture in January 2025  NEXT MD VISIT: 04/11/24 - Harlene Horton, MD 05/06/24 - Katelyn Moffo, PA & Donnajean Eddy, MD   SUBJECTIVE:                                                                                                                                                                                                          SUBJECTIVE STATEMENT: Pt reports her pain was very bad this morning (8-9/10), but the exercises helped to loosen her up.  EVAL: Pt reports a fall in January resulting in a compression fracture in her back which has exacerbated her chronic LBP.  Recent bone density study revealed degeneration of her bones with some osteopenia.  Back pain is daily but degree of pain changes with activity.  Back pain radiates around into pelvis but no numbness and tingling or change in bowel and bladder control other than increased awareness of urgency related to bladder.  Patient denies LE radicular pain.  PAIN: Are you having pain? Yes: NPRS scale: 6-7/10  Pain location: Midline and B low back wrapping around into pelvis  Pain description: shooting, sore, tightness  Aggravating factors: prolonged sitting or standing  Relieving factors: massage, hot pack   PERTINENT HISTORY:  L1 compression fracture s/p fall 05/31/23, osteopenia, lumbar facet arthritis, cervical radiculopathy, B knee pain, vertigo, HTN, CVA/TIA, OSA, hiatal hernia with GERD, peripheral edema  PRECAUTIONS: Back and Other: osteopenia  RED FLAGS: Bowel or bladder incontinence: Yes: increased urgency & bloating but no loss of control   WEIGHT BEARING RESTRICTIONS: No  FALLS:  Has patient fallen in last 6 months? No  LIVING ENVIRONMENT: Lives with: lives with their family and lives with their spouse Lives in: House Stairs: Yes: Internal: 15-17 steps; on right going up and External: 1 steps; none Has following equipment at home: None  OCCUPATION: Retired  PLOF: Independent and Leisure: traveling, some stretching   PATIENT GOALS: To be able to help manage my back pain on my own, at home and when traveling.   OBJECTIVE: (objective measures completed at initial evaluation unless  otherwise dated)  DIAGNOSTIC FINDINGS:  09/28/23 - DUAL X-RAY ABSORPTIOMETRY (DXA) FOR BONE MINERAL DENSITY  Osteopenia based on BMD.   Fracture risk is increased. Increased risk is based on  low BMD, and  history of vertebral fracture.   RECOMMENDATIONS:  1. All patients should optimize calcium  and vitamin D  intake.   2. Consider FDA-approved medical therapies in postmenopausal women  and men aged 42 years and older, based on the following:   - A hip or vertebral (clinical or morphometric) fracture   - T-score less than or equal to -2.5 and secondary causes have been  excluded.   - Low bone mass (T-score between -1.0 and -2.5) and a 10-year  probability of a hip fracture greater than or equal to 3% or a  10-year probability of a major osteoporosis-related fracture greater  than or equal to 20% based on the [place] -adapted WHO algorithm.   - Clinician judgment and/or patient preferences may indicate  treatment for people with 10-year fracture probabilities above or  below these levels   3. Patients with diagnosis of osteoporosis or at high risk for  fracture should have regular bone mineral density tests. For  patients eligible for Medicare, routine testing is allowed once  every 2 years. The testing frequency can be increased to one year  for patients who have rapidly progressing disease, those who are  receiving or discontinuing medical therapy to restore bone mass, or  have additional risk factors.   05/31/23 - CT LUMBAR SPINE WITHOUT CONTRAST  FINDINGS:  Segmentation: 5 lumbar type vertebrae.   Alignment: Chronic trace anterolisthesis of L4 on L5.   Vertebrae: Acute L1 superior endplate compression fracture with  5-10% vertebral body height loss. No suspicious bone lesion.   Paraspinal and other soft tissues: Hepatic steatosis. Abdominal  aortic atherosclerosis without aneurysm. Proximally 4 cm left-sided  uterine fibroid, similar to the prior CT.   Disc levels: Mild  lumbar spondylosis and advanced multilevel facet  arthrosis. No evidence of high-grade stenosis.   IMPRESSION:  1. Acute L1 compression fracture.  2.  Aortic Atherosclerosis (ICD10-I70.0).   04/17/23 - LUMBAR SPINE - COMPLETE 4+ VIEW FINDINGS: Osteopenia. There are five non-rib bearing lumbar-type vertebral bodies. There is levocurvature of the lower lumbar spine and dextrocurvature of the thoracolumbar junction. There is trace retrolisthesis of L5-S1. There is no evidence for acute fracture or subluxation. There is mild intervertebral disc space height loss at L5-S1. Posterior disc osteophyte complex at L1-2 and T12-L1. Severe lower lumbar facet arthropathy, LEFT greater than RIGHT. Atherosclerotic calcifications of the aorta.   IMPRESSION: Severe lower lumbar facet arthropathy.  PATIENT SURVEYS:  Modified Oswestry:  MODIFIED OSWESTRY DISABILITY SCALE  Date:  03/18/2024   Pain intensity 4 =  Pain medication provides me with little relief from pain.  2. Personal care (washing, dressing, etc.) 1 =  I can take care of myself normally, but it increases my pain.  3. Lifting 4 = I can lift only very light weights  4. Walking 3 =  Pain prevents me from walking more than  mile.  5. Sitting 0 =  I can sit in any chair as long as I like.  6. Standing 2 =  Pain prevents me from standing more than 1 hour  7. Sleeping 1 = I can sleep well only by using pain medication.  8. Social Life 2 = Pain prevents me from participating in more energetic activities (eg. sports, dancing).  9. Traveling 2 =  My pain restricts my travel over 2 hours.  10. Employment/ Homemaking 3 = Pain prevents me from doing anything but light duties.  Total 21/50  % Disability 42.0 % - severe  Interpretation of scores: Score Category Description  0-20% Minimal Disability The patient can cope with most living activities. Usually no treatment is indicated apart from advice on lifting, sitting and exercise  21-40%  Moderate Disability The patient experiences more pain and difficulty with sitting, lifting and standing. Travel and social life are more difficult and they may be disabled from work. Personal care, sexual activity and sleeping are not grossly affected, and the patient can usually be managed by conservative means  41-60% Severe Disability Pain remains the main problem in this group, but activities of daily living are affected. These patients require a detailed investigation  61-80% Crippled Back pain impinges on all aspects of the patient's life. Positive intervention is required  81-100% Bed-bound These patients are either bed-bound or exaggerating their symptoms  Bluford FORBES Zoe DELENA Karon DELENA, et al. Surgery versus conservative management of stable thoracolumbar fracture: the PRESTO feasibility RCT. [place] ([place]): [employer]; 2021 Nov. [facility] Technology Assessment, No. 25.62.) Appendix 3, Oswestry Disability Index category descriptors. Available from: Findjewelers.cz  Minimally Clinically Important Difference (MCID) = 12.8%  SCREENING FOR RED FLAGS: Bowel or bladder incontinence: Yes: Awareness of increased urgency & bloating but no loss of control Spinal tumors: No Cauda equina syndrome: No Compression fracture: No Abdominal aneurysm: No  COGNITION:  Overall cognitive status: Within functional limits for tasks assessed    SENSATION: WFL  POSTURE:  rounded shoulders, forward head, increased lumbar lordosis, increased thoracic kyphosis, weight shift right, and pronounced scoliosis - levocurvature of the lower lumbar spine and dextrocurvature of the thoracolumbar junction  PALPATION: Increased muscle tension and TTP in B lumbar paraspinals, glutes and piriformis  LUMBAR ROM:   Active  Eval  Flexion Hands to distal shins  Extension 75% limited  Right lateral flexion WFL - Hands to distal shin  Left lateral flexion WFL - Hands to distal shin   Right rotation 40% limited  Left rotation 40% limited  (Blank rows = not tested)  MUSCLE LENGTH: Hamstrings: WFL ITB: mild/mod tight B Piriformis: mild/mod tight B Hip flexors: mod tight B Quads: mild/mod tight B Heelcord:   LOWER EXTREMITY ROM:    Grossly WFL with restrictions as indicated above due to muscle tightness  LOWER EXTREMITY MMT:    MMT Right eval Left eval  Hip flexion 4+ 4  Hip extension 4- 4  Hip abduction 3+ 3+  Hip adduction 4- 4-  Hip internal rotation 4+ 4+  Hip external rotation 4- 4-  Knee flexion 4 4  Knee extension 4+ 4+  Ankle dorsiflexion 4 4  Ankle plantarflexion 4- 4-  Ankle inversion    Ankle eversion     (Blank rows = not tested)  FUNCTIONAL TESTS:  5 times sit to stand: 19.62 sec with hands on thighs  GAIT: Distance walked: Clinic distances Assistive device utilized: None Level of assistance: Complete Independence Gait pattern: lateral lean- Right and trunk flexed   TODAY'S TREATMENT:   04/14/2024  THERAPEUTIC EXERCISE: To improve strength, endurance, ROM, and flexibility.  Demonstration, verbal and tactile cues throughout for technique.  Rec Bike - L2 x 6 min Seated BATCA low row 15# 2 x 10  THERAPEUTIC ACTIVITIES: To improve functional performance.  Demonstration, verbal and tactile cues throughout for technique. Standing at wall ladder for UE support/balance: Hip ABD 2 x 10, 2nd set with looped RTB at distal thighs Hip extension 2 x 10, 2nd set with looped RTB at distal thighs Hip flexion march 2 x 10, 2nd set with  looped RTB at distal thighs Heel-toe raises 2 x 10 - cues for quad, glute and TrA engagement  Chair squat 2 x 10, 2nd set with looped RTB at distal thighs for slight hip ABD isometric  NEUROMUSCULAR RE-EDUCATION: To improve balance, coordination, kinesthesia, and posture. Standing GTB scap retraction + B shoulder row 2 x 10 Standing GTB scap retraction + B shoulder row 2 x 10   04/03/2024  THERAPEUTIC  EXERCISE: To improve strength, endurance, ROM, and flexibility.  Demonstration, verbal and tactile cues throughout for technique.  Rec Bike - L1 x 6 min Seated lunge position hip flexor stretch 2 x 30 bil Seated QL stretch 2 x 30 bil Seated lumbar flexion stretch from sitting in chair with hands sliding across mat table x 30 Seated 3-way lumbar flexion stretch with hands resting on green Pball  NEUROMUSCULAR RE-EDUCATION: To improve coordination, kinesthesia, and posture.  Seated hip flexion march + contralateral UE raise x 10 Standing bird dog leaning on back of chair x 10  THERAPEUTIC ACTIVITIES: To improve functional performance.  Demonstration, verbal and tactile cues throughout for technique.  Standing alt hip ABD x 10 bil - cues to avoid hip ER and maintain upright torso Standing heel raises 2 x 10 - cues for quad, glute and TrA engagement  Sit to/from stand x 10 - cues for positioning at edge of chair with emphasis on forward weight shift and upright torso to reduce thoracolumbar strain   03/25/2024  THERAPEUTIC EXERCISE: To improve strength, endurance, ROM, and flexibility.  Demonstration, verbal and tactile cues throughout for technique.  NuStep - L3 x 6 min (UE/LE) LTR 10 x 5 Hooklying figure-4 piriformis stretch 3 x 30 bil Mod thomas hip flexor stretch with strap 3 x 30 bil  Hooklying TrA isometric/PPT 10 x 5 Hooklying TrA/PPT + hip ADD ball squeeze isometric 10 x 5 Hooklying TrA/PPT + hip ABD/ER clam into looped RTB at distal thighs 10 x 5  SELF CARE: Provided education to increase independence with ADLs and to facilitate performance of basic household cleaning/chores. Provided education in proper posture and body mechanics for typical daily positioning and household chores to minimize strain on low back and reduce risk for osteoporotic fractures.   03/18/2024 - Eval SELF CARE:  Reviewed eval findings and role of PT in addressing identified deficits as well as  instruction in initial HEP (see below).    PATIENT EDUCATION:  Education details: HEP update - standing rows/retraction and HEP progression - GTB provided for HEP  Person educated: Patient Education method: Explanation, Demonstration, Verbal cues, and Handouts Education comprehension: verbalized understanding, returned demonstration, verbal cues required, and needs further education  HOME EXERCISE PROGRAM: Access Code: [email or web address] URL: [email or web address] Date: 04/14/2024 Prepared by: Elijah Hidden  Exercises - Supine Lower Trunk Rotation  - 2 x daily - 7 x weekly - 5 reps - 10 sec hold - Supine Figure 4 Piriformis Stretch  - 2 x daily - 7 x weekly - 3 reps - 30 sec hold - Seated Hip Flexor Stretch  - 2 x daily - 7 x weekly - 3 reps - 30 sec hold - Supine Posterior Pelvic Tilt  - 1 x daily - 7 x weekly - 2 sets - 10 reps - 5 sec hold - Supine Hip Adduction Isometric with Ball  - 1 x daily - 7 x weekly - 2 sets - 10 reps - 5 sec hold - Hooklying Clamshell with Resistance  - 1 x daily - 7 x weekly -  2 sets - 10 reps - 3-5 sec hold - Seated 3 Way Exercise Ball Roll Out Stretch  - 2 x daily - 7 x weekly - 3 reps - 30 sec hold - Sit to Stand  - 1 x daily - 3 x weekly - 2 sets - 10 reps - 3 sec hold - Isometric Squat with Resistance Loop  - 1 x daily - 3 x weekly - 2 sets - 10 reps - 3 sec hold - Standing Bilateral Low Shoulder Row with Anchored Resistance  - 1 x daily - 7 x weekly - 2 sets - 10 reps - 5 sec hold - Scapular Retraction with Resistance Advanced  - 1 x daily - 7 x weekly - 2 sets - 10 reps - 5 sec hold  Patient Education - Posture and Body Mechanics - Do it right & prevent fractures - Osteoporosis education   ASSESSMENT:  CLINICAL IMPRESSION: Aleana [name] reports the HEP exercises are very helpful with loosening her up and alleviating the pain, noting 40% improvement in pain, but she still has moderate to severe pain especially in the mornings.  STGs now  met.  She reports the RTB with her HEP is getting easier, therefore provided GTB for home use with HEP.  Progressed core/postural, lumbopelvic & B LE strengthening to include more upright exercises with good tolerance.  HEP updated at pt request.  [name] will benefit from continued skilled PT to address ongoing ROM, flexibility, abnormal deficits to improve mobility and activity tolerance with decreased pain interference, decreased risk for falls, and reduced risk for osteoporotic fractures.   EVAL: Madisyn Lightsey is a 73 y.o. female who was referred to physical therapy for evaluation and treatment for acute on chronic low back pain.  She has also recently been diagnosed with osteopenia.  Patient reports exacerbation of chronic LBP beginning after a fall resulting in L1 compression fracture in January of this year.  Pain is worse with prolonged sitting or standing.  Patient has deficits in lumbar ROM, proximal LE flexibility, core and B LE strength, abnormal posture secondary to severe scoliosis, and TTP with abnormal muscle tension in B lumbar paraspinals, glutes and piriformis which are interfering with ADLs and are impacting quality of life.  On Modified Oswestry patient scored 21/50 demonstrating 42% or severe disability.  Terasa [name] will benefit from skilled PT to address above deficits to improve mobility and activity tolerance with decreased pain interference.     OBJECTIVE IMPAIRMENTS: Abnormal gait, decreased activity tolerance, decreased mobility, difficulty walking, decreased ROM, decreased strength, decreased safety awareness, increased fascial restrictions, impaired perceived functional ability, increased muscle spasms, impaired flexibility, improper body mechanics, postural dysfunction, and pain.   ACTIVITY LIMITATIONS: carrying, lifting, bending, sitting, standing, squatting, sleeping, transfers, locomotion level, and caring for others  PARTICIPATION LIMITATIONS: meal prep, cleaning,  laundry, driving, and community activity  PERSONAL FACTORS: Age, Fitness, Past/current experiences, Time since onset of injury/illness/exacerbation, and 3+ comorbidities: L1 compression fracture s/p fall 05/31/23, osteopenia, lumbar facet arthritis, cervical radiculopathy, B knee pain, vertigo, HTN, CVA/TIA, OSA, hiatal hernia with GERD, peripheral edema are also affecting patient's functional outcome.   REHAB POTENTIAL: Good  CLINICAL DECISION MAKING: Evolving/moderate complexity  EVALUATION COMPLEXITY: Moderate   GOALS: Goals reviewed with patient? Yes  SHORT TERM GOALS: Target date: 04/15/2024  Patient will be independent with initial HEP to improve outcomes and carryover.  Baseline: Initial HEP provided on eval 03/25/24 - HEP reviewed 04/03/24 - HEP reviewed and updated with modified version of  hip flexor stretch as well as addition of thoracolumbar flexion stretching and proper sit to stand mechanics Goal status: MET - 04/14/24  2.  Patient will report 25% improvement in low back pain to improve QOL. Baseline: 5-6/10 on eval, up to 8-9/10 at worst (getting up in the morning) Goal status: MET - 04/14/24 - Pt reports 40% improvement   LONG TERM GOALS: Target date: 05/13/2024  Patient will be independent with ongoing/advanced HEP for self-management at home.  Baseline:  Goal status: IN PROGRESS - 04/14/24 - HEP updated today with standing rows/retraction, initial exercises progressed to GTB  2.  Patient will report 50-75% improvement in low back pain to improve QOL.  Baseline: 5-6/10 on eval, up to 8-9/10 at worst (getting up in the morning) Goal status: IN PROGRESS - 04/14/24 - Pt reports 40% improvement   3.  Patient to demonstrate ability to achieve and maintain good spinal alignment/posturing and body mechanics needed for daily activities to reduce stressors placed on low back musculature and reduce risk for osteoporotic fracture. Baseline:  Goal status: IN PROGRESS -  03/25/24 - education provided on proper posture and body mechanics as well as do's and don'ts to reduce risk factors for osteoporotic fractures  4.  Patient will demonstrate functional pain free lumbar ROM to perform ADLs.   Baseline: Refer to above lumbar ROM table Goal status: INITIAL  5.  Patient will demonstrate improved B LE strength to >/= 4+/5 for improved stability and ease of mobility. Baseline: Refer to above LE MMT table Goal status: INITIAL  6. Patient will report </= 30% on Modified Oswestry (MCID = 12%) to demonstrate improved functional ability with decreased pain interference. Baseline: 21 / 50 = 42.0 % Goal status: INITIAL  7.  Patient will demonstrate improved functional LE strength by completing 5xSTS and <15 seconds without need for UE assist on thighs. Baseline: 19.62 seconds with B UE assist pushing from thighs Goal status: INITIAL   PLAN:  PT FREQUENCY: 2x/week  PT DURATION: 8 weeks  PLANNED INTERVENTIONS: 97164- PT Re-evaluation, 97750- Physical Performance Testing, 97110-Therapeutic exercises, 97530- Therapeutic activity, 97112- Neuromuscular re-education, 97535- Self Care, [id number]- Manual therapy, [phone]- Gait training, [phone]- Aquatic Therapy, [phone]- Electrical stimulation (unattended), [phone]- Ultrasound, [id number]- Traction (mechanical), [id number]- Ionotophoresis 4mg /ml Dexamethasone, 79439 (1-2 muscles), 20561 (3+ muscles)- Dry Needling, Patient/Family education, Balance training, Stair training, Taping, Joint mobilization, Spinal mobilization, Cryotherapy, and Moist heat  PLAN FOR NEXT SESSION: gently progress lumbopelvic flexibility/stretching/ROM and progress core/lumbopelvic stabilization and strengthening, reviewing and updating HEP as appropriate; MT +/- TPDN and modalities as needed for pain management; review education on posture and body mechanics to reduce lumbar strain and risk for osteoporotic fractures PRN   Elijah CHRISTELLA Hidden, PT 04/14/2024, 2:00 PM

## 2024-04-14 NOTE — Patient Instructions (Signed)
 Preventive Care 83 Years and Older, Female Preventive care refers to lifestyle choices and visits with your health care provider that can promote health and wellness. Preventive care visits are also called wellness exams. What can I expect for my preventive care visit? Counseling Your health care provider may ask you questions about your: Medical history, including: Past medical problems. Family medical history. Pregnancy and menstrual history. History of falls. Current health, including: Memory and ability to understand (cognition). Emotional well-being. Home life and relationship well-being. Sexual activity and sexual health. Lifestyle, including: Alcohol, nicotine or tobacco, and drug use. Access to firearms. Diet, exercise, and sleep habits. Work and work Astronomer. Sunscreen use. Safety issues such as seatbelt and bike helmet use. Physical exam Your health care provider will check your: Height and weight. These may be used to calculate your BMI (body mass index). BMI is a measurement that tells if you are at a healthy weight. Waist circumference. This measures the distance around your waistline. This measurement also tells if you are at a healthy weight and may help predict your risk of certain diseases, such as type 2 diabetes and high blood pressure. Heart rate and blood pressure. Body temperature. Skin for abnormal spots. What immunizations do I need?  Vaccines are usually given at various ages, according to a schedule. Your health care provider will recommend vaccines for you based on your age, medical history, and lifestyle or other factors, such as travel or where you work. What tests do I need? Screening Your health care provider may recommend screening tests for certain conditions. This may include: Lipid and cholesterol levels. Hepatitis C test. Hepatitis B test. HIV (human immunodeficiency virus) test. STI (sexually transmitted infection) testing, if you are at  risk. Lung cancer screening. Colorectal cancer screening. Diabetes screening. This is done by checking your blood sugar (glucose) after you have not eaten for a while (fasting). Mammogram. Talk with your health care provider about how often you should have regular mammograms. BRCA-related cancer screening. This may be done if you have a family history of breast, ovarian, tubal, or peritoneal cancers. Bone density scan. This is done to screen for osteoporosis. Talk with your health care provider about your test results, treatment options, and if necessary, the need for more tests. Follow these instructions at home: Eating and drinking  Eat a diet that includes fresh fruits and vegetables, whole grains, lean protein, and low-fat dairy products. Limit your intake of foods with high amounts of sugar, saturated fats, and salt. Take vitamin and mineral supplements as recommended by your health care provider. Do not drink alcohol if your health care provider tells you not to drink. If you drink alcohol: Limit how much you have to 0-1 drink a day. Know how much alcohol is in your drink. In the [place] one drink equals one 12 oz bottle of beer (355 mL), one 5 oz glass of wine (148 mL), or one 1 oz glass of hard liquor (44 mL). Lifestyle Brush your teeth every morning and night with fluoride toothpaste. Floss one time each day. Exercise for at least 30 minutes 5 or more days each week. Do not use any products that contain nicotine or tobacco. These products include cigarettes, chewing tobacco, and vaping devices, such as e-cigarettes. If you need help quitting, ask your health care provider. Do not use drugs. If you are sexually active, practice safe sex. Use a condom or other form of protection in order to prevent STIs. Take aspirin only as told by  your health care provider. Make sure that you understand how much to take and what form to take. Work with your health care provider to find out whether it  is safe and beneficial for you to take aspirin daily. Ask your health care provider if you need to take a cholesterol-lowering medicine (statin). Find healthy ways to manage stress, such as: Meditation, yoga, or listening to music. Journaling. Talking to a trusted person. Spending time with friends and family. Minimize exposure to UV radiation to reduce your risk of skin cancer. Safety Always wear your seat belt while driving or riding in a vehicle. Do not drive: If you have been drinking alcohol. Do not ride with someone who has been drinking. When you are tired or distracted. While texting. If you have been using any mind-altering substances or drugs. Wear a helmet and other protective equipment during sports activities. If you have firearms in your house, make sure you follow all gun safety procedures. What's next? Visit your health care provider once a year for an annual wellness visit. Ask your health care provider how often you should have your eyes and teeth checked. Stay up to date on all vaccines. This information is not intended to replace advice given to you by your health care provider. Make sure you discuss any questions you have with your health care provider. Document Revised: 11/10/2020 Document Reviewed: 11/10/2020 [employer] Patient Education  2024 [employer].

## 2024-04-14 NOTE — Assessment & Plan Note (Signed)
 Tylenol  500 to 1000 mg bid, add heat in am. Restart Tizanidine  1-4 mg bid prn. Report if worsening.

## 2024-04-15 LAB — HEMOGLOBIN A1C: Hgb A1c MFr Bld: 6.5 % (ref 4.6–6.5)

## 2024-04-16 ENCOUNTER — Ambulatory Visit: Payer: Self-pay | Admitting: Family Medicine

## 2024-04-16 ENCOUNTER — Encounter: Payer: Self-pay | Admitting: Family Medicine

## 2024-04-16 ENCOUNTER — Other Ambulatory Visit (INDEPENDENT_AMBULATORY_CARE_PROVIDER_SITE_OTHER): Payer: Medicare (Managed Care)

## 2024-04-16 DIAGNOSIS — I10 Essential (primary) hypertension: Secondary | ICD-10-CM | POA: Diagnosis not present

## 2024-04-16 DIAGNOSIS — E559 Vitamin D deficiency, unspecified: Secondary | ICD-10-CM

## 2024-04-16 DIAGNOSIS — E782 Mixed hyperlipidemia: Secondary | ICD-10-CM

## 2024-04-16 DIAGNOSIS — Z0000 Encounter for general adult medical examination without abnormal findings: Secondary | ICD-10-CM

## 2024-04-16 LAB — LIPID PANEL
Cholesterol: 152 mg/dL (ref 0–200)
HDL: 48.5 mg/dL (ref >39.00)
LDL Cholesterol: 84 mg/dL (ref 0–99)
NonHDL: 103.58
Total CHOL/HDL Ratio: 3
Triglycerides: 99 mg/dL (ref 0.0–149.0)
VLDL: 19.8 mg/dL (ref 0.0–40.0)

## 2024-04-16 LAB — COMPREHENSIVE METABOLIC PANEL WITH GFR
ALT: 26 U/L (ref 0–35)
AST: 24 U/L (ref 0–37)
Albumin: 4.2 g/dL (ref 3.5–5.2)
Alkaline Phosphatase: 75 U/L (ref 39–117)
BUN: 13 mg/dL (ref 6–23)
CO2: 30 meq/L (ref 19–32)
Calcium: 9.5 mg/dL (ref 8.4–10.5)
Chloride: 102 meq/L (ref 96–112)
Creatinine, Ser: 0.63 mg/dL (ref 0.40–1.20)
GFR: 89.56 mL/min (ref >60.00)
Glucose, Bld: 105 mg/dL — ABNORMAL HIGH (ref 70–99)
Potassium: 4.3 meq/L (ref 3.5–5.1)
Sodium: 138 meq/L (ref 135–145)
Total Bilirubin: 0.5 mg/dL (ref 0.2–1.2)
Total Protein: 7.1 g/dL (ref 6.0–8.3)

## 2024-04-16 LAB — VITAMIN D 25 HYDROXY (VIT D DEFICIENCY, FRACTURES): VITD: 40.08 ng/mL (ref 30.00–100.00)

## 2024-04-16 LAB — TSH: TSH: 1.74 u[IU]/mL (ref 0.35–5.50)

## 2024-04-29 ENCOUNTER — Encounter: Payer: Self-pay | Admitting: Physical Therapy

## 2024-04-29 ENCOUNTER — Ambulatory Visit: Payer: Medicare (Managed Care) | Attending: Student | Admitting: Physical Therapy

## 2024-04-29 DIAGNOSIS — R262 Difficulty in walking, not elsewhere classified: Secondary | ICD-10-CM | POA: Insufficient documentation

## 2024-04-29 DIAGNOSIS — M5459 Other low back pain: Secondary | ICD-10-CM | POA: Insufficient documentation

## 2024-04-29 DIAGNOSIS — M62830 Muscle spasm of back: Secondary | ICD-10-CM | POA: Diagnosis not present

## 2024-04-29 DIAGNOSIS — R293 Abnormal posture: Secondary | ICD-10-CM | POA: Diagnosis not present

## 2024-04-29 NOTE — Therapy (Signed)
 OUTPATIENT PHYSICAL THERAPY TREATMENT   Patient Name: Margaret [name] MRN: [id number] DOB:[birth year], 73 y.o., female Today's Date: 04/29/2024  END OF SESSION:  PT End of Session - 04/29/24 1532     Visit Number 5    Date for Recertification  05/13/24    Authorization Type [employer]    Progress Note Due on Visit 10    PT Start Time 1532    PT Stop Time 1616    PT Time Calculation (min) 44 min    Activity Tolerance Patient tolerated treatment well    Behavior During Therapy [facility] for tasks assessed/performed             Past Medical History:  Diagnosis Date   Allergic state 03/25/2012   Allergy    Aortic calcification 08/27/2014   Atherosclerosis of aorta 04/12/2014   Back pain    Constipation 01/23/2017   COVID-19 02/21/2021   GERD (gastroesophageal reflux disease)    Hiatal hernia with gastroesophageal reflux 04/27/2010   Qualifier: Diagnosis of  By: Anice CMA, Darlene  Failed Omeprazole  and Protonix     Hx of adenomatous polyp of colon 01/23/2017   Colonoscopy 01/16/2017 with 2 polyps one was adenomatous. recommeded follow up in 5 years.performed by Dr Shila   Hyperlipidemia    Hypertension    Joint pain    Osteopenia 08/31/2012   Peripheral edema 12/13/2014   Sleep apnea    Unspecified sinusitis (chronic) 03/30/2013   Past Surgical History:  Procedure Laterality Date   24 HOUR PH STUDY N/A 12/15/2015   Procedure: 24 HOUR PH STUDY;  Surgeon: Gustav LULLA Shila, MD;  Location: [facility];  Service: Endoscopy;  Laterality: N/A;   DILATION AND CURETTAGE OF UTERUS     ESOPHAGEAL MANOMETRY N/A 12/15/2015   Procedure: ESOPHAGEAL MANOMETRY (EM);  Surgeon: Gustav LULLA Shila, MD;  Location: [facility];  Service: Endoscopy;  Laterality: N/A;   Patient Active Problem List   Diagnosis Date Noted   Age-related osteoporosis with current pathological fracture 04/14/2024   L1 vertebral fracture (HCC) 04/14/2024   Obesity (BMI 30-39.9) 02/12/2024   Pain in both  knees 04/25/2022   Heme positive stool 04/25/2022   Abdominal pain 01/13/2022   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   LAD (lymphadenopathy), anterior cervical 07/10/2021   Facet arthritis of lumbar region 04/07/2021   Muscle cramps 06/24/2020   Insomnia 05/29/2019   Educated about COVID-19 virus infection 09/20/2018   Headache 04/30/2018   Other fatigue 04/03/2018   Dyspnea 04/03/2018   Obstructive sleep apnea syndrome 04/03/2018   Vitamin D  deficiency 04/03/2018   Neck pain 12/16/2017   Fatty infiltration of liver 12/16/2017   Hx of colonic polyps 01/23/2017   Constipation 01/23/2017   Cervical radiculopathy    History of stroke 09/06/2016   TIA (transient ischemic attack) 09/06/2016   Right hand weakness    Peripheral edema 12/13/2014   Aortic atherosclerosis (HCC) 04/12/2014   Skin lesion of back 03/31/2014   Joint stiffness 03/31/2014   Essential hypertension 02/08/2014   Atypical chest pain 01/21/2014   Hyperglycemia 07/27/2013   Preventative health care 03/30/2013   Left shoulder pain 11/14/2012   Back pain 03/25/2012   Patellofemoral pain syndrome of right knee 03/25/2012   Allergy 03/25/2012   Helicobacter pylori infection 06/08/2010   Hiatal hernia with gastroesophageal reflux 04/27/2010   Hyperlipidemia, mixed 04/07/2010    PCP: Domenica Harlene LABOR, MD   REFERRING PROVIDER: Wheeler Harlene CROME, NP   REFERRING DIAG: M54.9 (ICD-10-CM) - Back pain, unspecified back  location, unspecified back pain laterality, unspecified chronicity   THERAPY DIAG:  Other low back pain  Abnormal posture  Muscle spasm of back  Difficulty in walking, not elsewhere classified  RATIONALE FOR EVALUATION AND TREATMENT: Rehabilitation  ONSET DATE: Chronic over several years, exacerbated since fall with L1 compression fracture in January 2025  NEXT MD VISIT: 04/11/24 - Harlene Horton, MD 05/06/24 - Katelyn Moffo, PA & Donnajean Eddy, MD   SUBJECTIVE:                                                                                                                                                                                                          SUBJECTIVE STATEMENT: Pt reports her pain is fluctuating in R>L low back but no longer wrapping around to the front of her pelvis.  She states the exercises still help to loosen her up.  EVAL: Pt reports a fall in January resulting in a compression fracture in her back which has exacerbated her chronic LBP.  Recent bone density study revealed degeneration of her bones with some osteopenia.  Back pain is daily but degree of pain changes with activity.  Back pain radiates around into pelvis but no numbness and tingling or change in bowel and bladder control other than increased awareness of urgency related to bladder.  Patient denies LE radicular pain.  PAIN: Are you having pain? Yes: NPRS scale: 6/10  Pain location: Midline and R>L low back   Pain description: shooting, sore, tightness  Aggravating factors: prolonged sitting or standing  Relieving factors: massage, hot pack   PERTINENT HISTORY:  L1 compression fracture s/p fall 05/31/23, osteopenia, lumbar facet arthritis, cervical radiculopathy, B knee pain, vertigo, HTN, CVA/TIA, OSA, hiatal hernia with GERD, peripheral edema  PRECAUTIONS: Back and Other: osteopenia  RED FLAGS: Bowel or bladder incontinence: Yes: increased urgency & bloating but no loss of control   WEIGHT BEARING RESTRICTIONS: No  FALLS:  Has patient fallen in last 6 months? No  LIVING ENVIRONMENT: Lives with: lives with their family and lives with their spouse Lives in: House Stairs: Yes: Internal: 15-17 steps; on right going up and External: 1 steps; none Has following equipment at home: None  OCCUPATION: Retired  PLOF: Independent and Leisure: traveling, some stretching   PATIENT GOALS: To be able to help manage my back pain on my own, at home and when traveling.   OBJECTIVE: (objective measures  completed at initial evaluation unless otherwise dated)  DIAGNOSTIC FINDINGS:  09/28/23 - DUAL X-RAY ABSORPTIOMETRY (DXA) FOR BONE MINERAL DENSITY  Osteopenia based on  BMD.   Fracture risk is increased. Increased risk is based on low BMD, and  history of vertebral fracture.   RECOMMENDATIONS:  1. All patients should optimize calcium  and vitamin D  intake.   2. Consider FDA-approved medical therapies in postmenopausal women  and men aged 34 years and older, based on the following:   - A hip or vertebral (clinical or morphometric) fracture   - T-score less than or equal to -2.5 and secondary causes have been  excluded.   - Low bone mass (T-score between -1.0 and -2.5) and a 10-year  probability of a hip fracture greater than or equal to 3% or a  10-year probability of a major osteoporosis-related fracture greater  than or equal to 20% based on the [place] -adapted WHO algorithm.   - Clinician judgment and/or patient preferences may indicate  treatment for people with 10-year fracture probabilities above or  below these levels   3. Patients with diagnosis of osteoporosis or at high risk for  fracture should have regular bone mineral density tests. For  patients eligible for Medicare, routine testing is allowed once  every 2 years. The testing frequency can be increased to one year  for patients who have rapidly progressing disease, those who are  receiving or discontinuing medical therapy to restore bone mass, or  have additional risk factors.   05/31/23 - CT LUMBAR SPINE WITHOUT CONTRAST  FINDINGS:  Segmentation: 5 lumbar type vertebrae.   Alignment: Chronic trace anterolisthesis of L4 on L5.   Vertebrae: Acute L1 superior endplate compression fracture with  5-10% vertebral body height loss. No suspicious bone lesion.   Paraspinal and other soft tissues: Hepatic steatosis. Abdominal  aortic atherosclerosis without aneurysm. Proximally 4 cm left-sided  uterine fibroid, similar to  the prior CT.   Disc levels: Mild lumbar spondylosis and advanced multilevel facet  arthrosis. No evidence of high-grade stenosis.   IMPRESSION:  1. Acute L1 compression fracture.  2.  Aortic Atherosclerosis (ICD10-I70.0).   04/17/23 - LUMBAR SPINE - COMPLETE 4+ VIEW FINDINGS: Osteopenia. There are five non-rib bearing lumbar-type vertebral bodies. There is levocurvature of the lower lumbar spine and dextrocurvature of the thoracolumbar junction. There is trace retrolisthesis of L5-S1. There is no evidence for acute fracture or subluxation. There is mild intervertebral disc space height loss at L5-S1. Posterior disc osteophyte complex at L1-2 and T12-L1. Severe lower lumbar facet arthropathy, LEFT greater than RIGHT. Atherosclerotic calcifications of the aorta.   IMPRESSION: Severe lower lumbar facet arthropathy.  PATIENT SURVEYS:  Modified Oswestry:  MODIFIED OSWESTRY DISABILITY SCALE  Date:  03/18/2024   Pain intensity 4 =  Pain medication provides me with little relief from pain.  2. Personal care (washing, dressing, etc.) 1 =  I can take care of myself normally, but it increases my pain.  3. Lifting 4 = I can lift only very light weights  4. Walking 3 =  Pain prevents me from walking more than  mile.  5. Sitting 0 =  I can sit in any chair as long as I like.  6. Standing 2 =  Pain prevents me from standing more than 1 hour  7. Sleeping 1 = I can sleep well only by using pain medication.  8. Social Life 2 = Pain prevents me from participating in more energetic activities (eg. sports, dancing).  9. Traveling 2 =  My pain restricts my travel over 2 hours.  10. Employment/ Homemaking 3 = Pain prevents me from doing anything but light duties.  Total 21/50  % Disability 42.0 % - severe   Interpretation of scores: Score Category Description  0-20% Minimal Disability The patient can cope with most living activities. Usually no treatment is indicated apart from advice on  lifting, sitting and exercise  21-40% Moderate Disability The patient experiences more pain and difficulty with sitting, lifting and standing. Travel and social life are more difficult and they may be disabled from work. Personal care, sexual activity and sleeping are not grossly affected, and the patient can usually be managed by conservative means  41-60% Severe Disability Pain remains the main problem in this group, but activities of daily living are affected. These patients require a detailed investigation  61-80% Crippled Back pain impinges on all aspects of the patient's life. Positive intervention is required  81-100% Bed-bound These patients are either bed-bound or exaggerating their symptoms  Bluford FORBES Zoe DELENA Karon DELENA, et al. Surgery versus conservative management of stable thoracolumbar fracture: the PRESTO feasibility RCT. [place] ([place]): [employer]; 2021 Nov. [facility],The Technology Assessment, No. 25.62.) Appendix 3, Oswestry Disability Index category descriptors. Available from: Findjewelers.cz  Minimally Clinically Important Difference (MCID) = 12.8%  SCREENING FOR RED FLAGS: Bowel or bladder incontinence: Yes: Awareness of increased urgency & bloating but no loss of control Spinal tumors: No Cauda equina syndrome: No Compression fracture: No Abdominal aneurysm: No  COGNITION:  Overall cognitive status: Within functional limits for tasks assessed    SENSATION: WFL  POSTURE:  rounded shoulders, forward head, increased lumbar lordosis, increased thoracic kyphosis, weight shift right, and pronounced scoliosis - levocurvature of the lower lumbar spine and dextrocurvature of the thoracolumbar junction  PALPATION: Increased muscle tension and TTP in B lumbar paraspinals, glutes and piriformis  LUMBAR ROM:   Active  Eval 04/29/2024  Flexion Hands to distal shins WFL - Hands to ankles  Extension 75% limited WFL  Right lateral flexion  WFL - Hands to distal shin WFL  Left lateral flexion WFL - Hands to distal shin WFL  Right rotation 40% limited 15% limited  Left rotation 40% limited 15% limited  (Blank rows = not tested)  MUSCLE LENGTH: Hamstrings: WFL ITB: mild/mod tight B Piriformis: mild/mod tight B Hip flexors: mod tight B Quads: mild/mod tight B Heelcord:   LOWER EXTREMITY ROM:    Grossly WFL with restrictions as indicated above due to muscle tightness  LOWER EXTREMITY MMT:    MMT Right eval Left eval R 04/29/24 L 04/29/24  Hip flexion 4+ 4 5 4+  Hip extension 4- 4 4- 4  Hip abduction 3+ 3+ 4- 4- p! L groin  Hip adduction 4- 4- 4+ 4+  Hip internal rotation 4+ 4+ 5 5  Hip external rotation 4- 4- 4+ 4+  Knee flexion 4 4 4+ 4+  Knee extension 4+ 4+ 5 5  Ankle dorsiflexion 4 4 5 5   Ankle plantarflexion 4- 4- 5 5  Ankle inversion      Ankle eversion       (Blank rows = not tested)  FUNCTIONAL TESTS:  5 times sit to stand: 19.62 sec with hands on thighs  GAIT: Distance walked: Clinic distances Assistive device utilized: None Level of assistance: Complete Independence Gait pattern: lateral lean- Right and trunk flexed   TODAY'S TREATMENT:    04/29/2024  THERAPEUTIC EXERCISE: To improve strength and endurance.    Rec Bike - L2 x 6 min  SELF CARE: Provided education to increase independence with ADLs and to facilitate performance of basic household cleaning/chores. Reviewed  education in proper posture and body mechanics for typical daily positioning and household chores to minimize strain on low back as well as reduce the risk for osteoporotic fractures.  THERAPEUTIC ACTIVITIES: To improve functional performance.  Demonstration, verbal and tactile cues throughout for technique. Lumbar ROM assessment LE MMT  NEUROMUSCULAR RE-EDUCATION: To improve coordination, kinesthesia, and posture. Bridge + GTB hip ABD isometric 10 x 5 Bridge + GTB hip ABD/ER clam 10 x 5 S/L GTB hip ABD/ER clam 10 x 3  bil   04/14/2024  THERAPEUTIC EXERCISE: To improve strength, endurance, ROM, and flexibility.  Demonstration, verbal and tactile cues throughout for technique.  Rec Bike - L2 x 6 min Seated BATCA low row 15# 2 x 10  THERAPEUTIC ACTIVITIES: To improve functional performance.  Demonstration, verbal and tactile cues throughout for technique. Standing at wall ladder for UE support/balance: Hip ABD 2 x 10, 2nd set with looped RTB at distal thighs Hip extension 2 x 10, 2nd set with looped RTB at distal thighs Hip flexion march 2 x 10, 2nd set with looped RTB at distal thighs Heel-toe raises 2 x 10 - cues for quad, glute and TrA engagement  Chair squat 2 x 10, 2nd set with looped RTB at distal thighs for slight hip ABD isometric  NEUROMUSCULAR RE-EDUCATION: To improve balance, coordination, kinesthesia, and posture. Standing GTB scap retraction + B shoulder row 2 x 10 Standing GTB scap retraction + B shoulder row 2 x 10   04/03/2024  THERAPEUTIC EXERCISE: To improve strength, endurance, ROM, and flexibility.  Demonstration, verbal and tactile cues throughout for technique.  Rec Bike - L1 x 6 min Seated lunge position hip flexor stretch 2 x 30 bil Seated QL stretch 2 x 30 bil Seated lumbar flexion stretch from sitting in chair with hands sliding across mat table x 30 Seated 3-way lumbar flexion stretch with hands resting on green Pball  NEUROMUSCULAR RE-EDUCATION: To improve coordination, kinesthesia, and posture.  Seated hip flexion march + contralateral UE raise x 10 Standing bird dog leaning on back of chair x 10  THERAPEUTIC ACTIVITIES: To improve functional performance.  Demonstration, verbal and tactile cues throughout for technique.  Standing alt hip ABD x 10 bil - cues to avoid hip ER and maintain upright torso Standing heel raises 2 x 10 - cues for quad, glute and TrA engagement  Sit to/from stand x 10 - cues for positioning at edge of chair with emphasis on forward weight  shift and upright torso to reduce thoracolumbar strain   03/25/2024  THERAPEUTIC EXERCISE: To improve strength, endurance, ROM, and flexibility.  Demonstration, verbal and tactile cues throughout for technique.  NuStep - L3 x 6 min (UE/LE) LTR 10 x 5 Hooklying figure-4 piriformis stretch 3 x 30 bil Mod thomas hip flexor stretch with strap 3 x 30 bil  Hooklying TrA isometric/PPT 10 x 5 Hooklying TrA/PPT + hip ADD ball squeeze isometric 10 x 5 Hooklying TrA/PPT + hip ABD/ER clam into looped RTB at distal thighs 10 x 5  SELF CARE: Provided education to increase independence with ADLs and to facilitate performance of basic household cleaning/chores. Provided education in proper posture and body mechanics for typical daily positioning and household chores to minimize strain on low back and reduce risk for osteoporotic fractures.   03/18/2024 - Eval SELF CARE:  Reviewed eval findings and role of PT in addressing identified deficits as well as instruction in initial HEP (see below).    PATIENT EDUCATION:  Education details: HEP  update - standing rows/retraction and HEP progression - GTB provided for HEP  Person educated: Patient Education method: Explanation, Demonstration, Verbal cues, and Handouts Education comprehension: verbalized understanding, returned demonstration, verbal cues required, and needs further education  HOME EXERCISE PROGRAM: Access Code: [email or web address] URL: [email or web address] Date: 04/29/2024 Prepared by: Elijah Hidden  Exercises - Supine Lower Trunk Rotation  - 2 x daily - 7 x weekly - 5 reps - 10 sec hold - Supine Figure 4 Piriformis Stretch  - 2 x daily - 7 x weekly - 3 reps - 30 sec hold - Seated Hip Flexor Stretch  - 2 x daily - 7 x weekly - 3 reps - 30 sec hold - Supine Posterior Pelvic Tilt  - 1 x daily - 7 x weekly - 2 sets - 10 reps - 5 sec hold - Supine Hip Adduction Isometric with Ball  - 1 x daily - 7 x weekly - 2 sets - 10 reps - 5 sec  hold - Hooklying Clamshell with Resistance  - 1 x daily - 7 x weekly - 2 sets - 10 reps - 3-5 sec hold - Seated 3 Way Exercise Ball Roll Out Stretch  - 2 x daily - 7 x weekly - 3 reps - 30 sec hold - Sit to Stand  - 1 x daily - 3 x weekly - 2 sets - 10 reps - 3 sec hold - Isometric Squat with Resistance Loop  - 1 x daily - 3 x weekly - 2 sets - 10 reps - 3 sec hold - Standing Bilateral Low Shoulder Row with Anchored Resistance  - 1 x daily - 7 x weekly - 2 sets - 10 reps - 5 sec hold - Scapular Retraction with Resistance Advanced  - 1 x daily - 7 x weekly - 2 sets - 10 reps - 5 sec hold - Clamshell with Resistance  - 1 x daily - 3 x weekly - 2 sets - 10 reps - 3-5 sec hold - Bridge with Hip Abduction and Resistance - Ground Touches  - 1 x daily - 3 x weekly - 2 sets - 10 reps - 3 sec hold  Patient Education - Posture and Body Mechanics - Do it right & prevent fractures - Osteoporosis education   ASSESSMENT:  CLINICAL IMPRESSION: Margaret [name] continues to report good benefit from her HEP exercises and improving her flexibility and helping her manage her pain.  She states her pain continues to fluctuate in the R>L low back although no longer wrapping around to the anterior pelvis.  She has demonstrated good improvement in lumbar ROM, with all motions now grossly WFL (STG #4 met).  Overall BLE strength also improving with majority of muscle groups now 4+ to 5/5 with only exceptions being the hip abduction and extension at 4- to 4/5.  Progressed lumbopelvic and hip strengthening to address ongoing weakness identified on MMT, updating HEP accordingly.  Margaret [name] will benefit from continued skilled PT to address ongoing ROM, flexibility, abnormal deficits to improve mobility and activity tolerance with decreased pain interference, decreased risk for falls, and reduced risk for osteoporotic fractures.  She has 2 visits remaining in her current PT schedule prior to the end of her certification period,  therefore we will continue to reassess LTG's to determine readiness for transition to HEP versus need for recertification at end of current POC.   EVAL: Margaret [name] is a 73 y.o. female who was referred to physical therapy for evaluation and treatment for acute  on chronic low back pain.  She has also recently been diagnosed with osteopenia.  Patient reports exacerbation of chronic LBP beginning after a fall resulting in L1 compression fracture in January of this year.  Pain is worse with prolonged sitting or standing.  Patient has deficits in lumbar ROM, proximal LE flexibility, core and B LE strength, abnormal posture secondary to severe scoliosis, and TTP with abnormal muscle tension in B lumbar paraspinals, glutes and piriformis which are interfering with ADLs and are impacting quality of life.  On Modified Oswestry patient scored 21/50 demonstrating 42% or severe disability.  Margaret [name] will benefit from skilled PT to address above deficits to improve mobility and activity tolerance with decreased pain interference.     OBJECTIVE IMPAIRMENTS: Abnormal gait, decreased activity tolerance, decreased mobility, difficulty walking, decreased ROM, decreased strength, decreased safety awareness, increased fascial restrictions, impaired perceived functional ability, increased muscle spasms, impaired flexibility, improper body mechanics, postural dysfunction, and pain.   ACTIVITY LIMITATIONS: carrying, lifting, bending, sitting, standing, squatting, sleeping, transfers, locomotion level, and caring for others  PARTICIPATION LIMITATIONS: meal prep, cleaning, laundry, driving, and community activity  PERSONAL FACTORS: Age, Fitness, Past/current experiences, Time since onset of injury/illness/exacerbation, and 3+ comorbidities: L1 compression fracture s/p fall 05/31/23, osteopenia, lumbar facet arthritis, cervical radiculopathy, B knee pain, vertigo, HTN, CVA/TIA, OSA, hiatal hernia with GERD, peripheral  edema are also affecting patient's functional outcome.   REHAB POTENTIAL: Good  CLINICAL DECISION MAKING: Evolving/moderate complexity  EVALUATION COMPLEXITY: Moderate   GOALS: Goals reviewed with patient? Yes  SHORT TERM GOALS: Target date: 04/15/2024  Patient will be independent with initial HEP to improve outcomes and carryover.  Baseline: Initial HEP provided on eval 03/25/24 - HEP reviewed 04/03/24 - HEP reviewed and updated with modified version of hip flexor stretch as well as addition of thoracolumbar flexion stretching and proper sit to stand mechanics Goal status: MET - 04/14/24  2.  Patient will report 25% improvement in low back pain to improve QOL. Baseline: 5-6/10 on eval, up to 8-9/10 at worst (getting up in the morning) Goal status: MET - 04/14/24 - Pt reports 40% improvement   LONG TERM GOALS: Target date: 05/13/2024  Patient will be independent with ongoing/advanced HEP for self-management at home.  Baseline:  04/14/24 - HEP updated today with standing rows/retraction, initial exercises progressed to GTB Goal status: IN PROGRESS - 04/29/24 - HEP updated today to address ongoing proximal LE weakness  2.  Patient will report 50-75% improvement in low back pain to improve QOL.  Baseline: 5-6/10 on eval, up to 8-9/10 at worst (getting up in the morning) 04/14/24 - Pt reports 40% improvement  Goal status: IN PROGRESS - 04/29/24 - Pain no longer wrapping around to anterior pelvis  3.  Patient to demonstrate ability to achieve and maintain good spinal alignment/posturing and body mechanics needed for daily activities to reduce stressors placed on low back musculature and reduce risk for osteoporotic fracture. Baseline:  03/25/24 - education provided on proper posture and body mechanics as well as do's and don'ts to reduce risk factors for osteoporotic fractures Goal status: MET - 04/29/24 - reviewed proper posture and body mechanics for typical daily activities - pt  reports these suggestions seem to help with managing her pain  4.  Patient will demonstrate functional pain free lumbar ROM to perform ADLs.   Baseline: Refer to above lumbar ROM table Goal status: MET - 04/29/24 - grossly WFL w/o pain  5.  Patient will demonstrate improved B LE  strength to >/= 4+/5 for improved stability and ease of mobility. Baseline: Refer to above LE MMT table Goal status: PARTIALLY MET - 04/29/24 - Met except B hip abduction and extension 4- to 4/5  6. Patient will report </= 30% on Modified Oswestry (MCID = 12%) to demonstrate improved functional ability with decreased pain interference. Baseline: 21 / 50 = 42.0 % Goal status: INITIAL  7.  Patient will demonstrate improved functional LE strength by completing 5xSTS and <15 seconds without need for UE assist on thighs. Baseline: 19.62 seconds with B UE assist pushing from thighs Goal status: INITIAL   PLAN:  PT FREQUENCY: 2x/week  PT DURATION: 8 weeks  PLANNED INTERVENTIONS: [id number]- PT Re-evaluation, [id number]- Physical Performance Testing, 97110-Therapeutic exercises, 97530- Therapeutic activity, 97112- Neuromuscular re-education, 97535- Self Care, 02859- Manual therapy, [phone]- Gait training, [phone]- Aquatic Therapy, [phone]- Electrical stimulation (unattended), [phone]- Ultrasound, [id number]- Traction (mechanical), [id number]- Ionotophoresis 4mg /ml Dexamethasone, 79439 (1-2 muscles), 20561 (3+ muscles)- Dry Needling, Patient/Family education, Balance training, Stair training, Taping, Joint mobilization, Spinal mobilization, Cryotherapy, and Moist heat  PLAN FOR NEXT SESSION: Continue LTG assessment to determine readiness for transition to HEP at end of POC versus need for recertification; gently progress lumbopelvic flexibility/stretching/ROM and progress core/lumbopelvic stabilization and strengthening, reviewing and updating HEP as appropriate; MT +/- TPDN and modalities as needed for pain management; review education on posture and  body mechanics to reduce lumbar strain and risk for osteoporotic fractures PRN   Elijah CHRISTELLA Hidden, PT 04/29/2024, 4:17 PM

## 2024-05-06 ENCOUNTER — Encounter: Payer: Self-pay | Admitting: Physical Therapy

## 2024-05-06 ENCOUNTER — Ambulatory Visit: Payer: Medicare (Managed Care) | Admitting: Physical Therapy

## 2024-05-06 DIAGNOSIS — M5459 Other low back pain: Secondary | ICD-10-CM

## 2024-05-06 DIAGNOSIS — R293 Abnormal posture: Secondary | ICD-10-CM

## 2024-05-06 DIAGNOSIS — M62830 Muscle spasm of back: Secondary | ICD-10-CM

## 2024-05-06 DIAGNOSIS — R262 Difficulty in walking, not elsewhere classified: Secondary | ICD-10-CM

## 2024-05-06 NOTE — Therapy (Signed)
 OUTPATIENT PHYSICAL THERAPY TREATMENT   Patient Name: Margaret [name] MRN: [id number] DOB:1950/11/19, 73 y.o., female Today's Date: 05/06/2024  END OF SESSION:  PT End of Session - 05/06/24 1528     Visit Number 6    Date for Recertification  05/13/24    Authorization Type [employer]    Progress Note Due on Visit 10    PT Start Time 1528    PT Stop Time 1615    PT Time Calculation (min) 47 min    Activity Tolerance Patient tolerated treatment well    Behavior During Therapy [facility] for tasks assessed/performed              Past Medical History:  Diagnosis Date   Allergic state 03/25/2012   Allergy    Aortic calcification 08/27/2014   Atherosclerosis of aorta 04/12/2014   Back pain    Constipation 01/23/2017   COVID-19 02/21/2021   GERD (gastroesophageal reflux disease)    Hiatal hernia with gastroesophageal reflux 04/27/2010   Qualifier: Diagnosis of  By: Anice CMA, Darlene  Failed Omeprazole  and Protonix     Hx of adenomatous polyp of colon 01/23/2017   Colonoscopy 01/16/2017 with 2 polyps one was adenomatous. recommeded follow up in 5 years.performed by Dr Shila   Hyperlipidemia    Hypertension    Joint pain    Osteopenia 08/31/2012   Peripheral edema 12/13/2014   Sleep apnea    Unspecified sinusitis (chronic) 03/30/2013   Past Surgical History:  Procedure Laterality Date   24 HOUR PH STUDY N/A 12/15/2015   Procedure: 24 HOUR PH STUDY;  Surgeon: Gustav LULLA Shila, MD;  Location: [facility];  Service: Endoscopy;  Laterality: N/A;   DILATION AND CURETTAGE OF UTERUS     ESOPHAGEAL MANOMETRY N/A 12/15/2015   Procedure: ESOPHAGEAL MANOMETRY (EM);  Surgeon: Gustav LULLA Shila, MD;  Location: [facility];  Service: Endoscopy;  Laterality: N/A;   Patient Active Problem List   Diagnosis Date Noted   Age-related osteoporosis with current pathological fracture 04/14/2024   L1 vertebral fracture (HCC) 04/14/2024   Obesity (BMI 30-39.9) 02/12/2024   Pain in  both knees 04/25/2022   Heme positive stool 04/25/2022   Abdominal pain 01/13/2022   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   LAD (lymphadenopathy), anterior cervical 07/10/2021   Facet arthritis of lumbar region 04/07/2021   Muscle cramps 06/24/2020   Insomnia 05/29/2019   Educated about COVID-19 virus infection 09/20/2018   Headache 04/30/2018   Other fatigue 04/03/2018   Dyspnea 04/03/2018   Obstructive sleep apnea syndrome 04/03/2018   Vitamin D  deficiency 04/03/2018   Neck pain 12/16/2017   Fatty infiltration of liver 12/16/2017   Hx of colonic polyps 01/23/2017   Constipation 01/23/2017   Cervical radiculopathy    History of stroke 09/06/2016   TIA (transient ischemic attack) 09/06/2016   Right hand weakness    Peripheral edema 12/13/2014   Aortic atherosclerosis (HCC) 04/12/2014   Skin lesion of back 03/31/2014   Joint stiffness 03/31/2014   Essential hypertension 02/08/2014   Atypical chest pain 01/21/2014   Hyperglycemia 07/27/2013   Preventative health care 03/30/2013   Left shoulder pain 11/14/2012   Back pain 03/25/2012   Patellofemoral pain syndrome of right knee 03/25/2012   Allergy 03/25/2012   Helicobacter pylori infection 06/08/2010   Hiatal hernia with gastroesophageal reflux 04/27/2010   Hyperlipidemia, mixed 04/07/2010    PCP: Domenica Harlene LABOR, MD   REFERRING PROVIDER: Wheeler Harlene CROME, NP   REFERRING DIAG: M54.9 (ICD-10-CM) - Back pain, unspecified  back location, unspecified back pain laterality, unspecified chronicity   THERAPY DIAG:  Other low back pain  Abnormal posture  Muscle spasm of back  Difficulty in walking, not elsewhere classified  RATIONALE FOR EVALUATION AND TREATMENT: Rehabilitation  ONSET DATE: Chronic over several years, exacerbated since fall with L1 compression fracture in January 2025  NEXT MD VISIT: 04/11/24 - Harlene Horton, MD 05/06/24 - Katelyn Moffo, PA & Donnajean Eddy, MD   SUBJECTIVE:                                                                                                                                                                                                          SUBJECTIVE STATEMENT: Pt reports more pressure than pain in her back today.  Worse with inactivity.  Recent HEP modifications are doing okay.  EVAL: Pt reports a fall in January resulting in a compression fracture in her back which has exacerbated her chronic LBP.  Recent bone density study revealed degeneration of her bones with some osteopenia.  Back pain is daily but degree of pain changes with activity.  Back pain radiates around into pelvis but no numbness and tingling or change in bowel and bladder control other than increased awareness of urgency related to bladder.  Patient denies LE radicular pain.  PAIN: Are you having pain? Yes: NPRS scale: 0/10  Pain location: Midline and R>L low back   Pain description: pressure  Aggravating factors: prolonged sitting or standing  Relieving factors: massage, hot pack   PERTINENT HISTORY:  L1 compression fracture s/p fall 05/31/23, osteopenia, lumbar facet arthritis, cervical radiculopathy, B knee pain, vertigo, HTN, CVA/TIA, OSA, hiatal hernia with GERD, peripheral edema  PRECAUTIONS: Back and Other: osteopenia  RED FLAGS: Bowel or bladder incontinence: Yes: increased urgency & bloating but no loss of control   WEIGHT BEARING RESTRICTIONS: No  FALLS:  Has patient fallen in last 6 months? No  LIVING ENVIRONMENT: Lives with: lives with their family and lives with their spouse Lives in: House Stairs: Yes: Internal: 15-17 steps; on right going up and External: 1 steps; none Has following equipment at home: None  OCCUPATION: Retired  PLOF: Independent and Leisure: traveling, some stretching   PATIENT GOALS: To be able to help manage my back pain on my own, at home and when traveling.   OBJECTIVE: (objective measures completed at initial evaluation unless otherwise  dated)  DIAGNOSTIC FINDINGS:  09/28/23 - DUAL X-RAY ABSORPTIOMETRY (DXA) FOR BONE MINERAL DENSITY  Osteopenia based on BMD.   Fracture risk is increased. Increased risk is based on  low BMD, and  history of vertebral fracture.   RECOMMENDATIONS:  1. All patients should optimize calcium  and vitamin D  intake.   2. Consider FDA-approved medical therapies in postmenopausal women  and men aged 66 years and older, based on the following:   - A hip or vertebral (clinical or morphometric) fracture   - T-score less than or equal to -2.5 and secondary causes have been  excluded.   - Low bone mass (T-score between -1.0 and -2.5) and a 10-year  probability of a hip fracture greater than or equal to 3% or a  10-year probability of a major osteoporosis-related fracture greater  than or equal to 20% based on the [place] -adapted WHO algorithm.   - Clinician judgment and/or patient preferences may indicate  treatment for people with 10-year fracture probabilities above or  below these levels   3. Patients with diagnosis of osteoporosis or at high risk for  fracture should have regular bone mineral density tests. For  patients eligible for Medicare, routine testing is allowed once  every 2 years. The testing frequency can be increased to one year  for patients who have rapidly progressing disease, those who are  receiving or discontinuing medical therapy to restore bone mass, or  have additional risk factors.   05/31/23 - CT LUMBAR SPINE WITHOUT CONTRAST  FINDINGS:  Segmentation: 5 lumbar type vertebrae.   Alignment: Chronic trace anterolisthesis of L4 on L5.   Vertebrae: Acute L1 superior endplate compression fracture with  5-10% vertebral body height loss. No suspicious bone lesion.   Paraspinal and other soft tissues: Hepatic steatosis. Abdominal  aortic atherosclerosis without aneurysm. Proximally 4 cm left-sided  uterine fibroid, similar to the prior CT.   Disc levels: Mild lumbar  spondylosis and advanced multilevel facet  arthrosis. No evidence of high-grade stenosis.   IMPRESSION:  1. Acute L1 compression fracture.  2.  Aortic Atherosclerosis (ICD10-I70.0).   04/17/23 - LUMBAR SPINE - COMPLETE 4+ VIEW FINDINGS: Osteopenia. There are five non-rib bearing lumbar-type vertebral bodies. There is levocurvature of the lower lumbar spine and dextrocurvature of the thoracolumbar junction. There is trace retrolisthesis of L5-S1. There is no evidence for acute fracture or subluxation. There is mild intervertebral disc space height loss at L5-S1. Posterior disc osteophyte complex at L1-2 and T12-L1. Severe lower lumbar facet arthropathy, LEFT greater than RIGHT. Atherosclerotic calcifications of the aorta.   IMPRESSION: Severe lower lumbar facet arthropathy.  PATIENT SURVEYS:  Modified Oswestry:  MODIFIED OSWESTRY DISABILITY SCALE   Date:  03/18/2024  05/06/2024  Pain intensity 4 =  Pain medication provides me with little relief from pain. 2  2. Personal care (washing, dressing, etc.) 1 =  I can take care of myself normally, but it increases my pain. 1  3. Lifting 4 = I can lift only very light weights 2  4. Walking 3 =  Pain prevents me from walking more than  mile. 1  5. Sitting 0 =  I can sit in any chair as long as I like. 3  6. Standing 2 =  Pain prevents me from standing more than 1 hour 3  7. Sleeping 1 = I can sleep well only by using pain medication. 1  8. Social Life 2 = Pain prevents me from participating in more energetic activities (eg. sports, dancing). 1  9. Traveling 2 =  My pain restricts my travel over 2 hours. 2  10. Employment/ Homemaking 3 = Pain prevents me from doing anything but light duties. 2  Total 21/50 18/50  % Disability 42.0 % - severe 36.0 % - moderate   Interpretation of scores: Score Category Description  0-20% Minimal Disability The patient can cope with most living activities. Usually no treatment is indicated apart from  advice on lifting, sitting and exercise  21-40% Moderate Disability The patient experiences more pain and difficulty with sitting, lifting and standing. Travel and social life are more difficult and they may be disabled from work. Personal care, sexual activity and sleeping are not grossly affected, and the patient can usually be managed by conservative means  41-60% Severe Disability Pain remains the main problem in this group, but activities of daily living are affected. These patients require a detailed investigation  61-80% Crippled Back pain impinges on all aspects of the patient's life. Positive intervention is required  81-100% Bed-bound These patients are either bed-bound or exaggerating their symptoms  Bluford FORBES Zoe DELENA Karon DELENA, et al. Surgery versus conservative management of stable thoracolumbar fracture: the PRESTO feasibility RCT. [place] ([place]): [employer]; 2021 Nov. [facility] Technology Assessment, No. 25.62.) Appendix 3, Oswestry Disability Index category descriptors. Available from: Findjewelers.cz  Minimally Clinically Important Difference (MCID) = 12.8%  SCREENING FOR RED FLAGS: Bowel or bladder incontinence: Yes: Awareness of increased urgency & bloating but no loss of control Spinal tumors: No Cauda equina syndrome: No Compression fracture: No Abdominal aneurysm: No  COGNITION:  Overall cognitive status: Within functional limits for tasks assessed    SENSATION: WFL  POSTURE:  rounded shoulders, forward head, increased lumbar lordosis, increased thoracic kyphosis, weight shift right, and pronounced scoliosis - levocurvature of the lower lumbar spine and dextrocurvature of the thoracolumbar junction  PALPATION: Increased muscle tension and TTP in B lumbar paraspinals, glutes and piriformis  LUMBAR ROM:   Active  Eval 04/29/2024  Flexion Hands to distal shins WFL - Hands to ankles  Extension 75% limited WFL  Right lateral  flexion WFL - Hands to distal shin WFL  Left lateral flexion WFL - Hands to distal shin WFL  Right rotation 40% limited 15% limited  Left rotation 40% limited 15% limited  (Blank rows = not tested)  MUSCLE LENGTH: Hamstrings: WFL ITB: mild/mod tight B Piriformis: mild/mod tight B Hip flexors: mod tight B Quads: mild/mod tight B Heelcord:   LOWER EXTREMITY ROM:    Grossly WFL with restrictions as indicated above due to muscle tightness  LOWER EXTREMITY MMT:    MMT Right eval Left eval R 04/29/24 L 04/29/24  Hip flexion 4+ 4 5 4+  Hip extension 4- 4 4- 4  Hip abduction 3+ 3+ 4- 4- p! L groin  Hip adduction 4- 4- 4+ 4+  Hip internal rotation 4+ 4+ 5 5  Hip external rotation 4- 4- 4+ 4+  Knee flexion 4 4 4+ 4+  Knee extension 4+ 4+ 5 5  Ankle dorsiflexion 4 4 5 5   Ankle plantarflexion 4- 4- 5 5  Ankle inversion      Ankle eversion       (Blank rows = not tested)  FUNCTIONAL TESTS:  5 times sit to stand: 19.62 sec with hands on thighs  GAIT: Distance walked: Clinic distances Assistive device utilized: None Level of assistance: Complete Independence Gait pattern: lateral lean- Right and trunk flexed   TODAY'S TREATMENT:    05/06/2024  THERAPEUTIC EXERCISE: To improve strength and endurance.    NuStep - L3 x 6 min (UE/LE)  THERAPEUTIC ACTIVITIES: To improve functional performance.  Demonstration, verbal and tactile cues throughout  for technique. 5xSTS = 12.87 sec w/o need for UE assist Modified Oswestry = 18 / 50 = 36.0 % Standing at wall ladder for UE support/balance: Hip ABD 2 x 10, 1st set with looped RTB at ankles, 2nd set with looped RTB at distal thighs Hip extension 2 x 10, 1st set with looped RTB at ankles, 2nd set with looped RTB at distal thighs  NEUROMUSCULAR RE-EDUCATION: To improve balance, coordination, kinesthesia, and posture. B side-stepping with looped RTB at distal thighs 3 x 20 ft Fwd/back monster walk with looped RTB at distal thighs 2 x 20 ft,  looped RTB at ankles 2 x 20 ft   04/29/2024  THERAPEUTIC EXERCISE: To improve strength and endurance.    Rec Bike - L2 x 6 min  SELF CARE: Provided education to increase independence with ADLs and to facilitate performance of basic household cleaning/chores. Reviewed education in proper posture and body mechanics for typical daily positioning and household chores to minimize strain on low back as well as reduce the risk for osteoporotic fractures.  THERAPEUTIC ACTIVITIES: To improve functional performance.  Demonstration, verbal and tactile cues throughout for technique. Lumbar ROM assessment LE MMT  NEUROMUSCULAR RE-EDUCATION: To improve coordination, kinesthesia, and posture. Bridge + GTB hip ABD isometric 10 x 5 Bridge + GTB hip ABD/ER clam 10 x 5 S/L GTB hip ABD/ER clam 10 x 3 bil   04/14/2024  THERAPEUTIC EXERCISE: To improve strength, endurance, ROM, and flexibility.  Demonstration, verbal and tactile cues throughout for technique.  Rec Bike - L2 x 6 min Seated BATCA low row 15# 2 x 10  THERAPEUTIC ACTIVITIES: To improve functional performance.  Demonstration, verbal and tactile cues throughout for technique. Standing at wall ladder for UE support/balance: Hip ABD 2 x 10, 2nd set with looped RTB at distal thighs Hip extension 2 x 10, 2nd set with looped RTB at distal thighs Hip flexion march 2 x 10, 2nd set with looped RTB at distal thighs Heel-toe raises 2 x 10 - cues for quad, glute and TrA engagement  Chair squat 2 x 10, 2nd set with looped RTB at distal thighs for slight hip ABD isometric  NEUROMUSCULAR RE-EDUCATION: To improve balance, coordination, kinesthesia, and posture. Standing GTB scap retraction + B shoulder row 2 x 10 Standing GTB scap retraction + B shoulder row 2 x 10   04/03/2024  THERAPEUTIC EXERCISE: To improve strength, endurance, ROM, and flexibility.  Demonstration, verbal and tactile cues throughout for technique.  Rec Bike - L1 x 6 min Seated  lunge position hip flexor stretch 2 x 30 bil Seated QL stretch 2 x 30 bil Seated lumbar flexion stretch from sitting in chair with hands sliding across mat table x 30 Seated 3-way lumbar flexion stretch with hands resting on green Pball  NEUROMUSCULAR RE-EDUCATION: To improve coordination, kinesthesia, and posture.  Seated hip flexion march + contralateral UE raise x 10 Standing bird dog leaning on back of chair x 10  THERAPEUTIC ACTIVITIES: To improve functional performance.  Demonstration, verbal and tactile cues throughout for technique.  Standing alt hip ABD x 10 bil - cues to avoid hip ER and maintain upright torso Standing heel raises 2 x 10 - cues for quad, glute and TrA engagement  Sit to/from stand x 10 - cues for positioning at edge of chair with emphasis on forward weight shift and upright torso to reduce thoracolumbar strain   03/25/2024  THERAPEUTIC EXERCISE: To improve strength, endurance, ROM, and flexibility.  Demonstration, verbal and tactile  cues throughout for technique.  NuStep - L3 x 6 min (UE/LE) LTR 10 x 5 Hooklying figure-4 piriformis stretch 3 x 30 bil Mod thomas hip flexor stretch with strap 3 x 30 bil  Hooklying TrA isometric/PPT 10 x 5 Hooklying TrA/PPT + hip ADD ball squeeze isometric 10 x 5 Hooklying TrA/PPT + hip ABD/ER clam into looped RTB at distal thighs 10 x 5  SELF CARE: Provided education to increase independence with ADLs and to facilitate performance of basic household cleaning/chores. Provided education in proper posture and body mechanics for typical daily positioning and household chores to minimize strain on low back and reduce risk for osteoporotic fractures.   03/18/2024 - Eval SELF CARE:  Reviewed eval findings and role of PT in addressing identified deficits as well as instruction in initial HEP (see below).    PATIENT EDUCATION:  Education details: HEP update - standing rows/retraction and HEP progression - GTB provided for  HEP  Person educated: Patient Education method: Explanation, Demonstration, Verbal cues, and Handouts Education comprehension: verbalized understanding, returned demonstration, verbal cues required, and needs further education  HOME EXERCISE PROGRAM: Access Code: [email or web address] URL: [email or web address] Date: 05/06/2024 Prepared by: Elijah Hidden  Exercises - Supine Lower Trunk Rotation  - 2 x daily - 7 x weekly - 5 reps - 10 sec hold - Supine Figure 4 Piriformis Stretch  - 2 x daily - 7 x weekly - 3 reps - 30 sec hold - Seated Hip Flexor Stretch  - 2 x daily - 7 x weekly - 3 reps - 30 sec hold - Supine Posterior Pelvic Tilt  - 1 x daily - 7 x weekly - 2 sets - 10 reps - 5 sec hold - Supine Hip Adduction Isometric with Ball  - 1 x daily - 7 x weekly - 2 sets - 10 reps - 5 sec hold - Hooklying Clamshell with Resistance  - 1 x daily - 7 x weekly - 2 sets - 10 reps - 3-5 sec hold - Seated 3 Way Exercise Ball Roll Out Stretch  - 2 x daily - 7 x weekly - 3 reps - 30 sec hold - Sit to Stand  - 1 x daily - 3 x weekly - 2 sets - 10 reps - 3 sec hold - Isometric Squat with Resistance Loop  - 1 x daily - 3 x weekly - 2 sets - 10 reps - 3 sec hold - Standing Bilateral Low Shoulder Row with Anchored Resistance  - 1 x daily - 7 x weekly - 2 sets - 10 reps - 5 sec hold - Scapular Retraction with Resistance Advanced  - 1 x daily - 7 x weekly - 2 sets - 10 reps - 5 sec hold - Clamshell with Resistance  - 1 x daily - 3 x weekly - 2 sets - 10 reps - 3-5 sec hold - Bridge with Hip Abduction and Resistance - Ground Touches  - 1 x daily - 3 x weekly - 2 sets - 10 reps - 3 sec hold - Side Stepping with Resistance at Thighs  - 1 x daily - 3-4 x weekly - 2 sets - 10 reps - 3 sec hold - Band Walks  - 1 x daily - 3-4 x weekly - 2 sets - 10 reps  Patient Education - Posture and Body Mechanics - Do it right & prevent fractures - Osteoporosis education   ASSESSMENT:  CLINICAL IMPRESSION: Margaret [name]  Margaret [name] reports more pressure than pain today, but states  pain still triggered by longer periods of inactivity.  Some improvement observed on Modified Oswestry with current score of 18/50 indicating 36% or moderate disability as compared to sever disability in initial assessment.  She states the latest HEP update is doing okay but notes preference for standing exercises, therefore reviewed options for standing hip strengthening with pt stating preference for resisted band walking - HEP updated accordingly.  Will consider instruction in relevant gym equipment next visit after patient discusses reactivating their [employer] membership with her husband.  One visit remaining in her current POC, therefore will complete remaining goal assessment to determine readiness for transition to HEP versus need for recertification.  EVAL: Marsha Duell is a 73 y.o. female who was referred to physical therapy for evaluation and treatment for acute on chronic low back pain.  She has also recently been diagnosed with osteopenia.  Patient reports exacerbation of chronic LBP beginning after a fall resulting in L1 compression fracture in January of this year.  Pain is worse with prolonged sitting or standing.  Patient has deficits in lumbar ROM, proximal LE flexibility, core and B LE strength, abnormal posture secondary to severe scoliosis, and TTP with abnormal muscle tension in B lumbar paraspinals, glutes and piriformis which are interfering with ADLs and are impacting quality of life.  On Modified Oswestry patient scored 21/50 demonstrating 42% or severe disability.  Leahann Margaret [name] will benefit from skilled PT to address above deficits to improve mobility and activity tolerance with decreased pain interference.     OBJECTIVE IMPAIRMENTS: Abnormal gait, decreased activity tolerance, decreased mobility, difficulty walking, decreased ROM, decreased strength, decreased safety awareness, increased fascial restrictions, impaired perceived  functional ability, increased muscle spasms, impaired flexibility, improper body mechanics, postural dysfunction, and pain.   ACTIVITY LIMITATIONS: carrying, lifting, bending, sitting, standing, squatting, sleeping, transfers, locomotion level, and caring for others  PARTICIPATION LIMITATIONS: meal prep, cleaning, laundry, driving, and community activity  PERSONAL FACTORS: Age, Fitness, Past/current experiences, Time since onset of injury/illness/exacerbation, and 3+ comorbidities: L1 compression fracture s/p fall 05/31/23, osteopenia, lumbar facet arthritis, cervical radiculopathy, B knee pain, vertigo, HTN, CVA/TIA, OSA, hiatal hernia with GERD, peripheral edema are also affecting patient's functional outcome.   REHAB POTENTIAL: Good  CLINICAL DECISION MAKING: Evolving/moderate complexity  EVALUATION COMPLEXITY: Moderate   GOALS: Goals reviewed with patient? Yes  SHORT TERM GOALS: Target date: 04/15/2024  Patient will be independent with initial HEP to improve outcomes and carryover.  Baseline: Initial HEP provided on eval 03/25/24 - HEP reviewed 04/03/24 - HEP reviewed and updated with modified version of hip flexor stretch as well as addition of thoracolumbar flexion stretching and proper sit to stand mechanics Goal status: MET - 04/14/24  2.  Patient will report 25% improvement in low back pain to improve QOL. Baseline: 5-6/10 on eval, up to 8-9/10 at worst (getting up in the morning) Goal status: MET - 04/14/24 - Pt reports 40% improvement   LONG TERM GOALS: Target date: 05/13/2024  Patient will be independent with ongoing/advanced HEP for self-management at home.  Baseline:  04/14/24 - HEP updated today with standing rows/retraction, initial exercises progressed to GTB 04/29/24 - HEP updated today to address ongoing proximal LE weakness Goal status: IN PROGRESS - 05/06/24 - standing alternatives provided for hip strengthening  2.  Patient will report 50-75% improvement in low  back pain to improve QOL.  Baseline: 5-6/10 on eval, up to 8-9/10 at worst (getting up in the morning) 04/14/24 - Pt reports 40% improvement  Goal status: IN  PROGRESS - 04/29/24 - Pain no longer wrapping around to anterior pelvis  3.  Patient to demonstrate ability to achieve and maintain good spinal alignment/posturing and body mechanics needed for daily activities to reduce stressors placed on low back musculature and reduce risk for osteoporotic fracture. Baseline:  03/25/24 - education provided on proper posture and body mechanics as well as do's and don'ts to reduce risk factors for osteoporotic fractures Goal status: MET - 04/29/24 - reviewed proper posture and body mechanics for typical daily activities - pt reports these suggestions seem to help with managing her pain  4.  Patient will demonstrate functional pain free lumbar ROM to perform ADLs.   Baseline: Refer to above lumbar ROM table Goal status: MET - 04/29/24 - grossly WFL w/o pain  5.  Patient will demonstrate improved B LE strength to >/= 4+/5 for improved stability and ease of mobility. Baseline: Refer to above LE MMT table Goal status: PARTIALLY MET - 04/29/24 - Met except B hip abduction and extension 4- to 4/5  6. Patient will report </= 30% on Modified Oswestry (MCID = 12%) to demonstrate improved functional ability with decreased pain interference. Baseline: 21 / 50 = 42.0 % Goal status: IN PROGRESS - 05/06/24 - 18 / 50 = 36.0 % (6% improvement)  7.  Patient will demonstrate improved functional LE strength by completing 5xSTS and <15 seconds without need for UE assist on thighs. Baseline: 19.62 seconds with B UE assist pushing from thighs Goal status: MET - 05/06/24 - 12.87 sec w/o need for UE assist   PLAN:  PT FREQUENCY: 2x/week  PT DURATION: 8 weeks  PLANNED INTERVENTIONS: 97164- PT Re-evaluation, 97750- Physical Performance Testing, 97110-Therapeutic exercises, 97530- Therapeutic activity, 97112- Neuromuscular  re-education, 97535- Self Care, 02859- Manual therapy, [id number]- Gait training, [phone]- Aquatic Therapy, [phone]- Electrical stimulation (unattended), [id number]- Ultrasound, [id number]- Traction (mechanical), [id number]- Ionotophoresis 4mg /ml Dexamethasone, 79439 (1-2 muscles), 20561 (3+ muscles)- Dry Needling, Patient/Family education, Balance training, Stair training, Taping, Joint mobilization, Spinal mobilization, Cryotherapy, and Moist heat  PLAN FOR NEXT SESSION: Continue LTG assessment to determine readiness for transition to HEP at end of POC versus need for recertification; If transition to HEP: instruction in relevant gym equipment for continued strengthening; If continuing: progress lumbopelvic flexibility/stretching/ROM and progress core/lumbopelvic stabilization and strengthening, reviewing and updating HEP as appropriate; MT +/- TPDN and modalities as needed for pain management; review education on posture and body mechanics to reduce lumbar strain and risk for osteoporotic fractures PRN   Elijah CHRISTELLA Hidden, PT 05/06/2024, 4:25 PM

## 2024-05-12 ENCOUNTER — Ambulatory Visit: Payer: Medicare (Managed Care) | Admitting: Physical Therapy

## 2024-05-13 ENCOUNTER — Ambulatory Visit: Payer: Medicare (Managed Care) | Admitting: Physical Therapy

## 2024-05-13 NOTE — Progress Notes (Unsigned)
 [facility] [address], [address] [place], [place] [address] [phone]  Date:  05/14/2024   Name:  Margaret [name]   DOB:  [birth year]   MRN:  [id number]  PCP:  Domenica Harlene LABOR, MD    Chief Complaint: No chief complaint on file.   History of Present Illness:  Margaret [name] is a 73 y.o. very pleasant female patient who presents with the following:  Patient seen today with concern of congestion and red eyes.  Primary patient my partner Dr. Domenica, I have not seen her myself previously History of hypertension, hyperglycemia, hyperlipidemia Discussed the use of AI scribe software for clinical note transcription with the patient, who gave verbal consent to proceed.  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   LAD (lymphadenopathy), anterior cervical 07/10/2021   Facet arthritis of lumbar region 04/07/2021   Muscle cramps 06/24/2020   Insomnia 05/29/2019   Educated about COVID-19 virus infection 09/20/2018   Headache 04/30/2018   Other fatigue 04/03/2018   Dyspnea 04/03/2018   Obstructive sleep apnea syndrome 04/03/2018   Vitamin D  deficiency 04/03/2018   Neck pain 12/16/2017   Fatty infiltration of liver 12/16/2017   Hx of colonic polyps 01/23/2017   Constipation 01/23/2017   Cervical radiculopathy    History of stroke 09/06/2016   TIA (transient ischemic attack) 09/06/2016   Right hand weakness    Peripheral edema 12/13/2014   Aortic atherosclerosis (HCC) 04/12/2014   Skin lesion of back 03/31/2014   Joint stiffness 03/31/2014   Essential hypertension 02/08/2014   Atypical chest pain 01/21/2014    Hyperglycemia 07/27/2013   Preventative health care 03/30/2013   Left shoulder pain 11/14/2012   Back pain 03/25/2012   Patellofemoral pain syndrome of right knee 03/25/2012   Allergy 03/25/2012   Helicobacter pylori infection 06/08/2010   Hiatal hernia with gastroesophageal reflux 04/27/2010   Hyperlipidemia, mixed 04/07/2010    Past Medical History:  Diagnosis Date   Allergic state 03/25/2012   Allergy    Aortic calcification 08/27/2014   Atherosclerosis of aorta 04/12/2014   Back pain    Constipation 01/23/2017   COVID-19 02/21/2021   GERD (gastroesophageal reflux disease)    Hiatal hernia with gastroesophageal reflux 04/27/2010   Qualifier: Diagnosis of  By: Anice CMA, Darlene  Failed Omeprazole  and Protonix     Hx of adenomatous polyp of colon 01/23/2017   Colonoscopy 01/16/2017 with 2 polyps one was adenomatous. recommeded follow up in 5 years.performed by Dr Shila   Hyperlipidemia    Hypertension    Joint pain    Osteopenia 08/31/2012   Peripheral edema 12/13/2014   Sleep apnea    Unspecified sinusitis (chronic) 03/30/2013    Past Surgical History:  Procedure Laterality Date   24 HOUR PH STUDY N/A 12/15/2015   Procedure: 24 HOUR PH STUDY;  Surgeon: Gustav LULLA Shila, MD;  Location: [facility];  Service: Endoscopy;  Laterality: N/A;   DILATION AND CURETTAGE OF UTERUS     ESOPHAGEAL MANOMETRY N/A 12/15/2015   Procedure: ESOPHAGEAL MANOMETRY (EM);  Surgeon: Gustav LULLA Shila, MD;  Location: [facility];  Service: Endoscopy;  Laterality: N/A;  Social History[1]  Family History  Problem Relation Age of Onset   Stroke Mother    High blood pressure Mother    High Cholesterol Mother    Lung cancer Father    Stroke Sister        38   Thyroid  disease Sister    Diabetes Sister    Hypertension Sister    Hyperlipidemia Sister    Hypertension Sister    Sleep apnea Son    Obesity Son    Colon cancer Neg Hx    Esophageal cancer Neg Hx    Stomach cancer Neg  Hx     Allergies[2]  Medication list has been reviewed and updated.  Medications Ordered Prior to Encounter[3]  Review of Systems:  As per HPI- otherwise negative.   Physical Examination: There were no vitals filed for this visit. There were no vitals filed for this visit. There is no height or weight on file to calculate BMI. Ideal Body Weight:    GEN: no acute distress. HEENT: Atraumatic, Normocephalic.  Ears and Nose: No external deformity. CV: RRR, No M/G/R. No JVD. No thrill. No extra heart sounds. PULM: CTA B, no wheezes, crackles, rhonchi. No retractions. No resp. distress. No accessory muscle use. ABD: S, NT, ND, +BS. No rebound. No HSM. EXTR: No c/c/e PSYCH: Normally interactive. Conversant.    Assessment and Plan: No diagnosis found.  Assessment & Plan   Signed Harlene Schroeder, MD    [1]  Social History Tobacco Use   Smoking status: Never   Smokeless tobacco: Never  Vaping Use   Vaping status: Never Used  Substance Use Topics   Alcohol use: Yes    Alcohol/week: 0.0 standard drinks of alcohol    Comment: Rarely   Drug use: No  [2]  Allergies Allergen Reactions   Lisinopril  Cough   Potassium-Containing Compounds Rash  [3]  Current Outpatient Medications on File Prior to Visit  Medication Sig Dispense Refill   acetaminophen  (TYLENOL ) 500 MG tablet Take 1 tablet (500 mg total) by mouth in the morning and at bedtime. (Patient taking differently: Take 500 mg by mouth every 4 (four) hours as needed.) 30 tablet 0   alendronate  (FOSAMAX ) 70 MG tablet Take 1 tablet (70 mg total) by mouth every 7 (seven) days. Take with a full glass of water on an empty stomach. 4 tablet 11   amLODipine  (NORVASC ) 5 MG tablet TAKE 1 TABLET(5 MG) BY MOUTH DAILY 90 tablet 1   aspirin  81 MG chewable tablet Chew by mouth daily.     Calcium  Carbonate (CALCIUM  500 PO) Take 1 tablet by mouth daily.     carvedilol  (COREG ) 6.25 MG tablet TAKE 1 TABLET(6.25 MG) BY MOUTH TWICE  DAILY WITH A MEAL 180 tablet 1   fish oil-omega-3 fatty acids 1000 MG capsule Take 2 g by mouth daily.     fluticasone  (FLONASE ) 50 MCG/ACT nasal spray Place 2 sprays into both nostrils in the morning and at bedtime. (Patient taking differently: Place 2 sprays into both nostrils as needed.) 16 g 6   ibuprofen  (ADVIL ) 800 MG tablet Take 800 mg by mouth every 8 (eight) hours as needed. (Patient not taking: Reported on 03/18/2024)     lidocaine  (LIDODERM ) 5 % Place 1 patch onto the skin every 12 (twelve) hours as needed. Remove & Discard patch within 12 hours or as directed by MD (Patient not taking: Reported on 03/18/2024) 30 patch 1   losartan  (COZAAR ) 25 MG tablet Take 1 tablet (25  mg total) by mouth daily. 90 tablet 1   meclizine  (ANTIVERT ) 25 MG tablet Take 1 tablet (25 mg total) by mouth 3 (three) times daily. (Patient not taking: Reported on 03/18/2024) 30 tablet 1   meloxicam  (MOBIC ) 7.5 MG tablet Take 1-2 tablets (7.5-15 mg total) by mouth daily as needed for pain. (Patient not taking: Reported on 03/18/2024) 30 tablet 3   Multiple Vitamin (MULTIVITAMIN WITH MINERALS) TABS tablet Take 1 tablet by mouth daily.     omeprazole  (PRILOSEC) 40 MG capsule Take 1 capsule (40 mg total) by mouth daily. (Patient taking differently: Take 40 mg by mouth daily as needed.) 90 capsule 1   rosuvastatin  (CRESTOR ) 10 MG tablet Take 1 tablet (10 mg total) by mouth daily. 90 tablet 2   tiZANidine  (ZANAFLEX ) 2 MG tablet Take 0.5-2 tablets (1-4 mg total) by mouth every 8 (eight) hours as needed for muscle spasms. 60 tablet 2   VITAMIN D  PO Take 2,500 Units by mouth daily.     Zinc 50 MG TABS Take 1 tablet by mouth daily.     No current facility-administered medications on file prior to visit.

## 2024-05-14 ENCOUNTER — Telehealth: Payer: Medicare (Managed Care) | Admitting: Family Medicine

## 2024-05-14 ENCOUNTER — Encounter: Payer: Self-pay | Admitting: Family Medicine

## 2024-05-14 VITALS — Ht 62.0 in

## 2024-05-14 DIAGNOSIS — J208 Acute bronchitis due to other specified organisms: Secondary | ICD-10-CM

## 2024-05-14 DIAGNOSIS — H1033 Unspecified acute conjunctivitis, bilateral: Secondary | ICD-10-CM

## 2024-05-14 MED ORDER — MOXIFLOXACIN HCL (2X DAY) 0.5 % OP SOLN: 1.0000 [drp] | Freq: Two times a day (BID) | OPHTHALMIC | 0 refills | Status: AC

## 2024-05-14 MED ORDER — DOXYCYCLINE HYCLATE 100 MG PO CAPS: 100.0000 mg | ORAL_CAPSULE | Freq: Two times a day (BID) | ORAL | 0 refills | Status: AC

## 2024-05-16 ENCOUNTER — Ambulatory Visit: Payer: Medicare (Managed Care) | Admitting: Physical Therapy

## 2024-05-16 ENCOUNTER — Encounter: Payer: Self-pay | Admitting: Physical Therapy

## 2024-05-16 DIAGNOSIS — M5459 Other low back pain: Secondary | ICD-10-CM | POA: Diagnosis not present

## 2024-05-16 DIAGNOSIS — M62830 Muscle spasm of back: Secondary | ICD-10-CM

## 2024-05-16 DIAGNOSIS — R262 Difficulty in walking, not elsewhere classified: Secondary | ICD-10-CM

## 2024-05-16 DIAGNOSIS — R293 Abnormal posture: Secondary | ICD-10-CM

## 2024-05-16 NOTE — Therapy (Signed)
 " OUTPATIENT PHYSICAL THERAPY TREATMENT / DISCHARGE SUMMARY  Progress Note  Reporting Period 03/18/2024 to 05/16/2024  See note below for Objective Data and Assessment of Progress/Goals.     Patient Name: Margaret [name] MRN: [id number] DOB:11/06/1950, 73 y.o., female Today's Date: 05/16/2024  END OF SESSION:  PT End of Session - 05/16/24 1015     Visit Number 7    Date for Recertification  05/13/24    Authorization Type [employer]    Progress Note Due on Visit 10    PT Start Time 1015    PT Stop Time 1057    PT Time Calculation (min) 42 min    Activity Tolerance Patient tolerated treatment well    Behavior During Therapy [facility] for tasks assessed/performed               Past Medical History:  Diagnosis Date   Allergic state 03/25/2012   Allergy    Aortic calcification 08/27/2014   Atherosclerosis of aorta 04/12/2014   Back pain    Constipation 01/23/2017   COVID-19 02/21/2021   GERD (gastroesophageal reflux disease)    Hiatal hernia with gastroesophageal reflux 04/27/2010   Qualifier: Diagnosis of  By: Anice CMA, Darlene  Failed Omeprazole  and Protonix     Hx of adenomatous polyp of colon 01/23/2017   Colonoscopy 01/16/2017 with 2 polyps one was adenomatous. recommeded follow up in 5 years.performed by Dr Shila   Hyperlipidemia    Hypertension    Joint pain    Osteopenia 08/31/2012   Peripheral edema 12/13/2014   Sleep apnea    Unspecified sinusitis (chronic) 03/30/2013   Past Surgical History:  Procedure Laterality Date   24 HOUR PH STUDY N/A 12/15/2015   Procedure: 24 HOUR PH STUDY;  Surgeon: Gustav LULLA Shila, MD;  Location: [facility];  Service: Endoscopy;  Laterality: N/A;   DILATION AND CURETTAGE OF UTERUS     ESOPHAGEAL MANOMETRY N/A 12/15/2015   Procedure: ESOPHAGEAL MANOMETRY (EM);  Surgeon: Gustav LULLA Shila, MD;  Location: [facility];  Service: Endoscopy;  Laterality: N/A;   Patient Active Problem List   Diagnosis Date Noted    Age-related osteoporosis with current pathological fracture 04/14/2024   L1 vertebral fracture (HCC) 04/14/2024   Obesity (BMI 30-39.9) 02/12/2024   Pain in both knees 04/25/2022   Heme positive stool 04/25/2022   Abdominal pain 01/13/2022   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   LAD (lymphadenopathy), anterior cervical 07/10/2021   Facet arthritis of lumbar region 04/07/2021   Muscle cramps 06/24/2020   Insomnia 05/29/2019   Educated about COVID-19 virus infection 09/20/2018   Headache 04/30/2018   Other fatigue 04/03/2018   Dyspnea 04/03/2018   Obstructive sleep apnea syndrome 04/03/2018   Vitamin D  deficiency 04/03/2018   Neck pain 12/16/2017   Fatty infiltration of liver 12/16/2017   Hx of colonic polyps 01/23/2017   Constipation 01/23/2017   Cervical radiculopathy    History of stroke 09/06/2016   TIA (transient ischemic attack) 09/06/2016   Right hand weakness    Peripheral edema 12/13/2014   Aortic atherosclerosis (HCC) 04/12/2014   Skin lesion of back 03/31/2014   Joint stiffness 03/31/2014   Essential hypertension 02/08/2014   Atypical chest pain 01/21/2014   Hyperglycemia 07/27/2013   Preventative health care 03/30/2013   Left shoulder pain 11/14/2012   Back pain 03/25/2012   Patellofemoral pain syndrome of right knee 03/25/2012   Allergy 03/25/2012   Helicobacter pylori infection 06/08/2010   Hiatal hernia with gastroesophageal reflux 04/27/2010   Hyperlipidemia, mixed  04/07/2010    PCP: Domenica Harlene LABOR, MD   REFERRING PROVIDER: Wheeler Harlene CROME, NP   REFERRING DIAG: M54.9 (ICD-10-CM) - Back pain, unspecified back location, unspecified back pain laterality, unspecified chronicity   THERAPY DIAG:  Other low back pain  Abnormal posture  Muscle spasm of back  Difficulty in walking, not elsewhere classified  RATIONALE FOR EVALUATION AND TREATMENT: Rehabilitation  ONSET DATE: Chronic over several years, exacerbated since fall with L1 compression fracture in  January 2025  NEXT MD VISIT: 04/11/24 - Harlene Domenica, MD 05/06/24 - Katelyn Moffo, PA & Donnajean Eddy, MD   SUBJECTIVE:                                                                                                                                                                                                         SUBJECTIVE STATEMENT: Pt reports her pain is improving, but typically still worse in the morning or with inactivity.  Recent HEP modifications are doing okay however she thinks she might be ready for stronger resistance.  EVAL: Pt reports a fall in January resulting in a compression fracture in her back which has exacerbated her chronic LBP.  Recent bone density study revealed degeneration of her bones with some osteopenia.  Back pain is daily but degree of pain changes with activity.  Back pain radiates around into pelvis but no numbness and tingling or change in bowel and bladder control other than increased awareness of urgency related to bladder.  Patient denies LE radicular pain.  PAIN: Are you having pain? Yes: NPRS scale: up 4-5/10  Pain location: Midline and R>L low back   Pain description: pressure  Aggravating factors: prolonged sitting or standing  Relieving factors: massage, hot pack   PERTINENT HISTORY:  L1 compression fracture s/p fall 05/31/23, osteopenia, lumbar facet arthritis, cervical radiculopathy, B knee pain, vertigo, HTN, CVA/TIA, OSA, hiatal hernia with GERD, peripheral edema  PRECAUTIONS: Back and Other: osteopenia  RED FLAGS: Bowel or bladder incontinence: Yes: increased urgency & bloating but no loss of control   WEIGHT BEARING RESTRICTIONS: No  FALLS:  Has patient fallen in last 6 months? No  LIVING ENVIRONMENT: Lives with: lives with their family and lives with their spouse Lives in: House Stairs: Yes: Internal: 15-17 steps; on right going up and External: 1 steps; none Has following equipment at home: None  OCCUPATION: Retired  PLOF:  Independent and Leisure: traveling, some stretching   PATIENT GOALS: To be able to help manage my back pain on my own, at home and when traveling.  OBJECTIVE: (objective measures completed at initial evaluation unless otherwise dated)  DIAGNOSTIC FINDINGS:  09/28/23 - DUAL X-RAY ABSORPTIOMETRY (DXA) FOR BONE MINERAL DENSITY  Osteopenia based on BMD.   Fracture risk is increased. Increased risk is based on low BMD, and  history of vertebral fracture.   RECOMMENDATIONS:  1. All patients should optimize calcium  and vitamin D  intake.   2. Consider FDA-approved medical therapies in postmenopausal women  and men aged 15 years and older, based on the following:   - A hip or vertebral (clinical or morphometric) fracture   - T-score less than or equal to -2.5 and secondary causes have been  excluded.   - Low bone mass (T-score between -1.0 and -2.5) and a 10-year  probability of a hip fracture greater than or equal to 3% or a  10-year probability of a major osteoporosis-related fracture greater  than or equal to 20% based on the [place] -adapted WHO algorithm.   - Clinician judgment and/or patient preferences may indicate  treatment for people with 10-year fracture probabilities above or  below these levels   3. Patients with diagnosis of osteoporosis or at high risk for  fracture should have regular bone mineral density tests. For  patients eligible for Medicare, routine testing is allowed once  every 2 years. The testing frequency can be increased to one year  for patients who have rapidly progressing disease, those who are  receiving or discontinuing medical therapy to restore bone mass, or  have additional risk factors.   05/31/23 - CT LUMBAR SPINE WITHOUT CONTRAST  FINDINGS:  Segmentation: 5 lumbar type vertebrae.   Alignment: Chronic trace anterolisthesis of L4 on L5.   Vertebrae: Acute L1 superior endplate compression fracture with  5-10% vertebral body height loss. No  suspicious bone lesion.   Paraspinal and other soft tissues: Hepatic steatosis. Abdominal  aortic atherosclerosis without aneurysm. Proximally 4 cm left-sided  uterine fibroid, similar to the prior CT.   Disc levels: Mild lumbar spondylosis and advanced multilevel facet  arthrosis. No evidence of high-grade stenosis.   IMPRESSION:  1. Acute L1 compression fracture.  2.  Aortic Atherosclerosis (ICD10-I70.0).   04/17/23 - LUMBAR SPINE - COMPLETE 4+ VIEW FINDINGS: Osteopenia. There are five non-rib bearing lumbar-type vertebral bodies. There is levocurvature of the lower lumbar spine and dextrocurvature of the thoracolumbar junction. There is trace retrolisthesis of L5-S1. There is no evidence for acute fracture or subluxation. There is mild intervertebral disc space height loss at L5-S1. Posterior disc osteophyte complex at L1-2 and T12-L1. Severe lower lumbar facet arthropathy, LEFT greater than RIGHT. Atherosclerotic calcifications of the aorta.   IMPRESSION: Severe lower lumbar facet arthropathy.  PATIENT SURVEYS:  Modified Oswestry:  MODIFIED OSWESTRY DISABILITY SCALE    Date:  03/18/2024  05/06/2024 05/16/24  Pain intensity 4 =  Pain medication provides me with little relief from pain. 2 0  2. Personal care (washing, dressing, etc.) 1 =  I can take care of myself normally, but it increases my pain. 1 0  3. Lifting 4 = I can lift only very light weights 2 4  4. Walking 3 =  Pain prevents me from walking more than  mile. 1 1  5. Sitting 0 =  I can sit in any chair as long as I like. 3 1  6. Standing 2 =  Pain prevents me from standing more than 1 hour 3 2  7. Sleeping 1 = I can sleep well only by using pain medication. 1 0  8. Social Life 2 = Pain prevents me from participating in more energetic activities (eg. sports, dancing). 1 0  9. Traveling 2 =  My pain restricts my travel over 2 hours. 2 1  10. Employment/ Homemaking 3 = Pain prevents me from doing anything but light  duties. 2 2  Total 21/50 18/50 11 / 50  % Disability 42.0 % - severe 36.0 % - moderate 22.0 % - moderate   Interpretation of scores: Score Category Description  0-20% Minimal Disability The patient can cope with most living activities. Usually no treatment is indicated apart from advice on lifting, sitting and exercise  21-40% Moderate Disability The patient experiences more pain and difficulty with sitting, lifting and standing. Travel and social life are more difficult and they may be disabled from work. Personal care, sexual activity and sleeping are not grossly affected, and the patient can usually be managed by conservative means  41-60% Severe Disability Pain remains the main problem in this group, but activities of daily living are affected. These patients require a detailed investigation  61-80% Crippled Back pain impinges on all aspects of the patients life. Positive intervention is required  81-100% Bed-bound These patients are either bed-bound or exaggerating their symptoms  Bluford FORBES Zoe DELENA Karon DELENA, et al. Surgery versus conservative management of stable thoracolumbar fracture: the PRESTO feasibility RCT. [place] ([place]): [employer]; 2021 Nov. [facility] Technology Assessment, No. 25.62.) Appendix 3, Oswestry Disability Index category descriptors. Available from: Findjewelers.cz  Minimally Clinically Important Difference (MCID) = 12.8%  SCREENING FOR RED FLAGS: Bowel or bladder incontinence: Yes: Awareness of increased urgency & bloating but no loss of control Spinal tumors: No Cauda equina syndrome: No Compression fracture: No Abdominal aneurysm: No  COGNITION:  Overall cognitive status: Within functional limits for tasks assessed    SENSATION: WFL  POSTURE:  rounded shoulders, forward head, increased lumbar lordosis, increased thoracic kyphosis, weight shift right, and pronounced scoliosis - levocurvature of the lower lumbar  spine and dextrocurvature of the thoracolumbar junction  PALPATION: Increased muscle tension and TTP in B lumbar paraspinals, glutes and piriformis  LUMBAR ROM:   Active  Eval 04/29/2024  Flexion Hands to distal shins WFL - Hands to ankles  Extension 75% limited WFL  Right lateral flexion WFL - Hands to distal shin WFL  Left lateral flexion WFL - Hands to distal shin WFL  Right rotation 40% limited 15% limited  Left rotation 40% limited 15% limited  (Blank rows = not tested)  MUSCLE LENGTH: Hamstrings: WFL ITB: mild/mod tight B Piriformis: mild/mod tight B Hip flexors: mod tight B Quads: mild/mod tight B Heelcord:   LOWER EXTREMITY ROM:    Grossly WFL with restrictions as indicated above due to muscle tightness  LOWER EXTREMITY MMT:    MMT Right eval Left eval R 04/29/24 L 04/29/24 R 05/16/24 L 05/16/24  Hip flexion 4+ 4 5 4+    Hip extension 4- 4 4- 4 4 4   Hip abduction 3+ 3+ 4- 4- p! L groin 4+ 4  Hip adduction 4- 4- 4+ 4+    Hip internal rotation 4+ 4+ 5 5    Hip external rotation 4- 4- 4+ 4+    Knee flexion 4 4 4+ 4+    Knee extension 4+ 4+ 5 5    Ankle dorsiflexion 4 4 5 5     Ankle plantarflexion 4- 4- 5 5    Ankle inversion        Ankle eversion         (  Blank rows = not tested)  FUNCTIONAL TESTS:  5 times sit to stand: 19.62 sec with hands on thighs  GAIT: Distance walked: Clinic distances Assistive device utilized: None Level of assistance: Complete Independence Gait pattern: lateral lean- Right and trunk flexed   TODAY'S TREATMENT:    05/16/2024  THERAPEUTIC EXERCISE: To improve strength and endurance.  Demonstration, verbal and tactile cues throughout for technique.  Rec Bike - L2 x 6 min HEP review/update:  Hooklying TrA/PPT + hip ABD/ER clam into looped RTB at distal thighs 10 x 5 Bridge + blue TB hip ABD isometric 10 x 5 Bridge + blue TB  hip ABD/ER clam 10 x 5 S/L blue TB  hip ABD/ER clam 10 x 3 bil Standing GTB scap retraction + B  shoulder row 2 x 10 Standing GTB scap retraction + B shoulder extension 2 x 10  THERAPEUTIC ACTIVITIES: To improve functional performance.  Demonstration, verbal and tactile cues throughout for technique. Hip MMT Modified Oswestry = 11 / 50 = 22.0 % Goal assessment  SELF CARE: Provided education to prevent loss of gains achieved with physical therapy.  Verbally reviewed remainder of current HEP clarifying recommended frequency for ongoing performance to maintain gains achieved with PT and prevent future decline in function.    05/06/2024  THERAPEUTIC EXERCISE: To improve strength and endurance.    NuStep - L3 x 6 min (UE/LE)  THERAPEUTIC ACTIVITIES: To improve functional performance.  Demonstration, verbal and tactile cues throughout for technique. 5xSTS = 12.87 sec w/o need for UE assist Modified Oswestry = 18 / 50 = 36.0 % Standing at wall ladder for UE support/balance: Hip ABD 2 x 10, 1st set with looped RTB at ankles, 2nd set with looped RTB at distal thighs Hip extension 2 x 10, 1st set with looped RTB at ankles, 2nd set with looped RTB at distal thighs  NEUROMUSCULAR RE-EDUCATION: To improve balance, coordination, kinesthesia, and posture. B side-stepping with looped RTB at distal thighs 3 x 20 ft Fwd/back monster walk with looped RTB at distal thighs 2 x 20 ft, looped RTB at ankles 2 x 20 ft   04/29/2024  THERAPEUTIC EXERCISE: To improve strength and endurance.    Rec Bike - L2 x 6 min  SELF CARE: Provided education to increase independence with ADLs and to facilitate performance of basic household cleaning/chores. Reviewed education in proper posture and body mechanics for typical daily positioning and household chores to minimize strain on low back as well as reduce the risk for osteoporotic fractures.  THERAPEUTIC ACTIVITIES: To improve functional performance.  Demonstration, verbal and tactile cues throughout for technique. Lumbar ROM assessment LE MMT  NEUROMUSCULAR  RE-EDUCATION: To improve coordination, kinesthesia, and posture. Bridge + GTB hip ABD isometric 10 x 5 Bridge + GTB hip ABD/ER clam 10 x 5 S/L GTB hip ABD/ER clam 10 x 3 bil   04/14/2024  THERAPEUTIC EXERCISE: To improve strength, endurance, ROM, and flexibility.  Demonstration, verbal and tactile cues throughout for technique.  Rec Bike - L2 x 6 min Seated BATCA low row 15# 2 x 10  THERAPEUTIC ACTIVITIES: To improve functional performance.  Demonstration, verbal and tactile cues throughout for technique. Standing at wall ladder for UE support/balance: Hip ABD 2 x 10, 2nd set with looped RTB at distal thighs Hip extension 2 x 10, 2nd set with looped RTB at distal thighs Hip flexion march 2 x 10, 2nd set with looped RTB at distal thighs Heel-toe raises 2 x 10 - cues for quad,  glute and TrA engagement  Chair squat 2 x 10, 2nd set with looped RTB at distal thighs for slight hip ABD isometric  NEUROMUSCULAR RE-EDUCATION: To improve balance, coordination, kinesthesia, and posture. Standing GTB scap retraction + B shoulder row 2 x 10 Standing GTB scap retraction + B shoulder extension 2 x 10   04/03/2024  THERAPEUTIC EXERCISE: To improve strength, endurance, ROM, and flexibility.  Demonstration, verbal and tactile cues throughout for technique.  Rec Bike - L1 x 6 min Seated lunge position hip flexor stretch 2 x 30 bil Seated QL stretch 2 x 30 bil Seated lumbar flexion stretch from sitting in chair with hands sliding across mat table x 30 Seated 3-way lumbar flexion stretch with hands resting on green Pball  NEUROMUSCULAR RE-EDUCATION: To improve coordination, kinesthesia, and posture.  Seated hip flexion march + contralateral UE raise x 10 Standing bird dog leaning on back of chair x 10  THERAPEUTIC ACTIVITIES: To improve functional performance.  Demonstration, verbal and tactile cues throughout for technique.  Standing alt hip ABD x 10 bil - cues to avoid hip ER and maintain  upright torso Standing heel raises 2 x 10 - cues for quad, glute and TrA engagement  Sit to/from stand x 10 - cues for positioning at edge of chair with emphasis on forward weight shift and upright torso to reduce thoracolumbar strain   03/25/2024  THERAPEUTIC EXERCISE: To improve strength, endurance, ROM, and flexibility.  Demonstration, verbal and tactile cues throughout for technique.  NuStep - L3 x 6 min (UE/LE) LTR 10 x 5 Hooklying figure-4 piriformis stretch 3 x 30 bil Mod thomas hip flexor stretch with strap 3 x 30 bil  Hooklying TrA isometric/PPT 10 x 5 Hooklying TrA/PPT + hip ADD ball squeeze isometric 10 x 5 Hooklying TrA/PPT + hip ABD/ER clam into looped RTB at distal thighs 10 x 5  SELF CARE: Provided education to increase independence with ADLs and to facilitate performance of basic household cleaning/chores. Provided education in proper posture and body mechanics for typical daily positioning and household chores to minimize strain on low back and reduce risk for osteoporotic fractures.   03/18/2024 - Eval SELF CARE:  Reviewed eval findings and role of PT in addressing identified deficits as well as instruction in initial HEP (see below).    PATIENT EDUCATION:  Education details: HEP update - standing rows/retraction and HEP progression - GTB provided for HEP  Person educated: Patient Education method: Explanation, Demonstration, Verbal cues, and Handouts Education comprehension: verbalized understanding, returned demonstration, verbal cues required, and needs further education  HOME EXERCISE PROGRAM: Access Code: [email or web address] URL: [email or web address] Date: 05/16/2024 Prepared by: Elijah Hidden  Exercises - Supine Lower Trunk Rotation  - 2 x daily - 7 x weekly - 5 reps - 10 sec hold - Supine Figure 4 Piriformis Stretch  - 2 x daily - 7 x weekly - 3 reps - 30 sec hold - Seated Hip Flexor Stretch  - 2 x daily - 7 x weekly - 3 reps - 30 sec hold -  Supine Hip Adduction Isometric with Ball  - 1 x daily - 7 x weekly - 2 sets - 10 reps - 5 sec hold - Hooklying Clamshell with Resistance  - 1 x daily - 7 x weekly - 2 sets - 10 reps - 3-5 sec hold - Seated 3 Way Exercise Ball Roll Out Stretch  - 2 x daily - 7 x weekly - 3 reps - 30 sec hold - Sit to  Stand  - 1 x daily - 3 x weekly - 2 sets - 10 reps - 3 sec hold - Isometric Squat with Resistance Loop  - 1 x daily - 3 x weekly - 2 sets - 10 reps - 3 sec hold - Standing Bilateral Low Shoulder Row with Anchored Resistance  - 1 x daily - 7 x weekly - 2 sets - 10 reps - 5 sec hold - Scapular Retraction with Resistance Advanced  - 1 x daily - 7 x weekly - 2 sets - 10 reps - 5 sec hold - Clamshell with Resistance  - 1 x daily - 3 x weekly - 2 sets - 10 reps - 3-5 sec hold - Bridge with Hip Abduction and Resistance - Ground Touches  - 1 x daily - 3 x weekly - 2 sets - 10 reps - 3 sec hold - Side Stepping with Resistance at Thighs  - 1 x daily - 3-4 x weekly - 2 sets - 10 reps - 3 sec hold - Band Walks  - 1 x daily - 3-4 x weekly - 2 sets - 10 reps  Patient Education - Posture and Body Mechanics - Do it right & prevent fractures - Osteoporosis education   ASSESSMENT:  CLINICAL IMPRESSION: Annissa [name] reports 50-60% improvement in her pain since starting PT.  Continued improvement observed on Modified Oswestry with current score of 11/50 indicating 22% disability and demonstrating a 20% improvement as compared to 42% disability on initial assessment.  She has demonstrated significant gains inher overall LE strength with the majority of muscle groups now 4+/5 or greater, with exception of L hip abduction and B hip extension at 4/5.  [name] feels that she is able to manage her pain with her HEP exercises and stretches and is confident that she will be able to continue to improve her strength and activity tolerance with continued HEP performance.  We reviewed her HEP, progressing the resistance levels for  some exercises and discussed recommended frequency for ongoing performance to continue progress, maintain gains achieved with PT and prevent future decline in function. All PT goals now met or partially met and Lurine [name] feels ready to transition to her HEP, therefore will proceed with discharge from physical therapy for this episode.   EVAL: Gracelynn Prindle is a 73 y.o. female who was referred to physical therapy for evaluation and treatment for acute on chronic low back pain.  She has also recently been diagnosed with osteopenia.  Patient reports exacerbation of chronic LBP beginning after a fall resulting in L1 compression fracture in January of this year.  Pain is worse with prolonged sitting or standing.  Patient has deficits in lumbar ROM, proximal LE flexibility, core and B LE strength, abnormal posture secondary to severe scoliosis, and TTP with abnormal muscle tension in B lumbar paraspinals, glutes and piriformis which are interfering with ADLs and are impacting quality of life.  On Modified Oswestry patient scored 21/50 demonstrating 42% or severe disability.  Gaylynn [name] will benefit from skilled PT to address above deficits to improve mobility and activity tolerance with decreased pain interference.     OBJECTIVE IMPAIRMENTS: Abnormal gait, decreased activity tolerance, decreased mobility, difficulty walking, decreased ROM, decreased strength, decreased safety awareness, increased fascial restrictions, impaired perceived functional ability, increased muscle spasms, impaired flexibility, improper body mechanics, postural dysfunction, and pain.   ACTIVITY LIMITATIONS: carrying, lifting, bending, sitting, standing, squatting, sleeping, transfers, locomotion level, and caring for others  PARTICIPATION LIMITATIONS: meal prep, cleaning,  laundry, driving, and community activity  PERSONAL FACTORS: Age, Fitness, Past/current experiences, Time since onset of injury/illness/exacerbation, and 3+  comorbidities: L1 compression fracture s/p fall 05/31/23, osteopenia, lumbar facet arthritis, cervical radiculopathy, B knee pain, vertigo, HTN, CVA/TIA, OSA, hiatal hernia with GERD, peripheral edema are also affecting patient's functional outcome.   REHAB POTENTIAL: Good  CLINICAL DECISION MAKING: Evolving/moderate complexity  EVALUATION COMPLEXITY: Moderate   GOALS: Goals reviewed with patient? Yes  SHORT TERM GOALS: Target date: 04/15/2024  Patient will be independent with initial HEP to improve outcomes and carryover.  Baseline: Initial HEP provided on eval 03/25/24 - HEP reviewed 04/03/24 - HEP reviewed and updated with modified version of hip flexor stretch as well as addition of thoracolumbar flexion stretching and proper sit to stand mechanics Goal status: MET - 04/14/24  2.  Patient will report 25% improvement in low back pain to improve QOL. Baseline: 5-6/10 on eval, up to 8-9/10 at worst (getting up in the morning) Goal status: MET - 04/14/24 - Pt reports 40% improvement   LONG TERM GOALS: Target date: 05/13/2024  Patient will be independent with ongoing/advanced HEP for self-management at home.  Baseline:  04/14/24 - HEP updated today with standing rows/retraction, initial exercises progressed to GTB 04/29/24 - HEP updated today to address ongoing proximal LE weakness 05/06/24 - standing alternatives provided for hip strengthening Goal status: MET - 05/16/24  2.  Patient will report 50-75% improvement in low back pain to improve QOL.  Baseline: 5-6/10 on eval, up to 8-9/10 at worst (getting up in the morning) 04/14/24 - Pt reports 40% improvement  04/29/24 - Pain no longer wrapping around to anterior pelvis Goal status: MET - 05/16/24 - Pt reports 50-60% improvement  3.  Patient to demonstrate ability to achieve and maintain good spinal alignment/posturing and body mechanics needed for daily activities to reduce stressors placed on low back musculature and reduce risk  for osteoporotic fracture. Baseline:  03/25/24 - education provided on proper posture and body mechanics as well as do's and don'ts to reduce risk factors for osteoporotic fractures Goal status: MET - 04/29/24 - reviewed proper posture and body mechanics for typical daily activities - pt reports these suggestions seem to help with managing her pain  4.  Patient will demonstrate functional pain free lumbar ROM to perform ADLs.   Baseline: Refer to above lumbar ROM table Goal status: MET - 04/29/24 - grossly WFL w/o pain  5.  Patient will demonstrate improved B LE strength to >/= 4+/5 for improved stability and ease of mobility. Baseline: Refer to above LE MMT table 04/29/24 - Met except B hip abduction and extension 4- to 4/5 Goal status: PARTIALLY MET - 05/16/24 - Met except L hip abduction and B hip extension 4/5   6. Patient will report </= 30% on Modified Oswestry (MCID = 12%) to demonstrate improved functional ability with decreased pain interference. Baseline: 21 / 50 = 42.0 % 05/06/24 - 18 / 50 = 36.0 % (6% improvement) Goal status: MET - 05/16/24 - 11 / 50 = 22.0 %  7.  Patient will demonstrate improved functional LE strength by completing 5xSTS and <15 seconds without need for UE assist on thighs. Baseline: 19.62 seconds with B UE assist pushing from thighs Goal status: MET - 05/06/24 - 12.87 sec w/o need for UE assist   PLAN:  PT FREQUENCY: 2x/week  PT DURATION: 8 weeks  PLANNED INTERVENTIONS: 97164- PT Re-evaluation, 97750- Physical Performance Testing, 97110-Therapeutic exercises, 97530- Therapeutic activity, 97112- Neuromuscular re-education,  02464- Self Care, 02859- Manual therapy, [id number]- Gait training, [phone]- Aquatic Therapy, [phone]- Electrical stimulation (unattended), [id number]- Ultrasound, [id number]- Traction (mechanical), [id number]- Ionotophoresis 4mg /ml Dexamethasone, 20560 (1-2 muscles), 20561 (3+ muscles)- Dry Needling, Patient/Family education, Balance training, Stair training,  Taping, Joint mobilization, Spinal mobilization, Cryotherapy, and Moist heat  PLAN FOR NEXT SESSION: transition to HEP + DC from PT   PHYSICAL THERAPY DISCHARGE SUMMARY  Visits from Start of Care: 7  Current functional level related to goals / functional outcomes: Refer to above clinical impression and goal assessment.    Remaining deficits: Mild L hip abduction and B hip extension weakness at 4/5    Education / Equipment: HEP Posture and body mechanics education Osteoporosis precautions    Patient agrees to discharge. Patient goals were mostly met. Patient is being discharged due to being pleased with the current functional level.   Elijah CHRISTELLA Hidden, PT 05/16/2024, 12:41 PM   "

## 2024-07-09 ENCOUNTER — Ambulatory Visit: Payer: Medicare (Managed Care) | Admitting: Student

## 2025-02-12 ENCOUNTER — Ambulatory Visit: Payer: Medicare (Managed Care)
